# Patient Record
Sex: Male | Born: 1940
Health system: Southern US, Community
[De-identification: ages and names within clinical notes are randomized; demographics above are authoritative.]

## PROBLEM LIST (undated history)

## (undated) DIAGNOSIS — E785 Hyperlipidemia, unspecified: Secondary | ICD-10-CM

## (undated) DIAGNOSIS — W19XXXA Unspecified fall, initial encounter: Secondary | ICD-10-CM

## (undated) DIAGNOSIS — I639 Cerebral infarction, unspecified: Secondary | ICD-10-CM

## (undated) DIAGNOSIS — I872 Venous insufficiency (chronic) (peripheral): Secondary | ICD-10-CM

## (undated) DIAGNOSIS — I739 Peripheral vascular disease, unspecified: Secondary | ICD-10-CM

## (undated) DIAGNOSIS — I82409 Acute embolism and thrombosis of unspecified deep veins of unspecified lower extremity: Secondary | ICD-10-CM

## (undated) DIAGNOSIS — E669 Obesity, unspecified: Secondary | ICD-10-CM

## (undated) DIAGNOSIS — I219 Acute myocardial infarction, unspecified: Secondary | ICD-10-CM

## (undated) DIAGNOSIS — I509 Heart failure, unspecified: Secondary | ICD-10-CM

## (undated) DIAGNOSIS — I2510 Atherosclerotic heart disease of native coronary artery without angina pectoris: Secondary | ICD-10-CM

## (undated) DIAGNOSIS — I779 Disorder of arteries and arterioles, unspecified: Secondary | ICD-10-CM

## (undated) DIAGNOSIS — F0390 Unspecified dementia without behavioral disturbance: Secondary | ICD-10-CM

## (undated) DIAGNOSIS — I4891 Unspecified atrial fibrillation: Secondary | ICD-10-CM

## (undated) DIAGNOSIS — G4730 Sleep apnea, unspecified: Secondary | ICD-10-CM

## (undated) DIAGNOSIS — I10 Essential (primary) hypertension: Secondary | ICD-10-CM

## (undated) DIAGNOSIS — A4902 Methicillin resistant Staphylococcus aureus infection, unspecified site: Secondary | ICD-10-CM

## (undated) DIAGNOSIS — I214 Non-ST elevation (NSTEMI) myocardial infarction: Secondary | ICD-10-CM

## (undated) DIAGNOSIS — Y92009 Unspecified place in unspecified non-institutional (private) residence as the place of occurrence of the external cause: Secondary | ICD-10-CM

## (undated) HISTORY — DX: Heart failure, unspecified: I50.9

## (undated) HISTORY — DX: Acute myocardial infarction, unspecified: I21.9

## (undated) HISTORY — DX: Unspecified fall, initial encounter: W19.XXXA

## (undated) HISTORY — DX: Disorder of arteries and arterioles, unspecified: I77.9

## (undated) HISTORY — DX: Unspecified place in unspecified non-institutional (private) residence as the place of occurrence of the external cause: Y92.009

## (undated) HISTORY — DX: Atherosclerotic heart disease of native coronary artery without angina pectoris: I25.10

## (undated) HISTORY — DX: Unspecified atrial fibrillation: I48.91

## (undated) HISTORY — DX: Venous insufficiency (chronic) (peripheral): I87.2

## (undated) HISTORY — DX: Hyperlipidemia, unspecified: E78.5

## (undated) HISTORY — DX: Peripheral vascular disease, unspecified: I73.9

## (undated) HISTORY — DX: Essential (primary) hypertension: I10

## (undated) HISTORY — DX: Cerebral infarction, unspecified: I63.9

## (undated) HISTORY — DX: Acute embolism and thrombosis of unspecified deep veins of unspecified lower extremity: I82.409

## (undated) SURGERY — Surgical Case
Anesthesia: *Unknown

---

## 1978-05-14 HISTORY — PX: PR VEIN BYPASS GRAFT,AORTO-FEM-POP: 35551

## 1979-01-13 HISTORY — PX: INGUINAL HERNIA REPAIR: SUR1180

## 1995-05-15 HISTORY — PX: CORONARY ARTERY BYPASS GRAFT: SHX141

## 2000-12-17 ENCOUNTER — Ambulatory Visit (HOSPITAL_COMMUNITY): Admission: RE | Admit: 2000-12-17 | Discharge: 2000-12-18 | Payer: Self-pay | Admitting: Internal Medicine

## 2000-12-17 ENCOUNTER — Encounter: Payer: Self-pay | Admitting: Cardiology

## 2000-12-18 ENCOUNTER — Encounter: Payer: Self-pay | Admitting: Cardiology

## 2001-09-30 ENCOUNTER — Ambulatory Visit (HOSPITAL_COMMUNITY): Admission: RE | Admit: 2001-09-30 | Discharge: 2001-10-01 | Payer: Self-pay | Admitting: Cardiovascular Disease

## 2001-09-30 ENCOUNTER — Encounter: Payer: Self-pay | Admitting: Cardiovascular Disease

## 2001-10-01 ENCOUNTER — Encounter: Payer: Self-pay | Admitting: Cardiovascular Disease

## 2003-05-19 ENCOUNTER — Encounter: Admission: RE | Admit: 2003-05-19 | Discharge: 2003-05-19 | Payer: Self-pay | Admitting: Infectious Diseases

## 2003-08-18 ENCOUNTER — Encounter: Admission: RE | Admit: 2003-08-18 | Discharge: 2003-08-18 | Payer: Self-pay | Admitting: Infectious Diseases

## 2010-11-30 ENCOUNTER — Inpatient Hospital Stay (HOSPITAL_COMMUNITY)
Admission: EM | Admit: 2010-11-30 | Discharge: 2010-12-12 | DRG: 254 | Disposition: A | Discharge status: Home Health | Payer: Medicare Other | Attending: Vascular Surgery | Admitting: Vascular Surgery

## 2010-11-30 DIAGNOSIS — T827XXA Infection and inflammatory reaction due to other cardiac and vascular devices, implants and grafts, initial encounter: Principal | ICD-10-CM | POA: Diagnosis present

## 2010-11-30 DIAGNOSIS — K219 Gastro-esophageal reflux disease without esophagitis: Secondary | ICD-10-CM | POA: Diagnosis present

## 2010-11-30 DIAGNOSIS — Y849 Medical procedure, unspecified as the cause of abnormal reaction of the patient, or of later complication, without mention of misadventure at the time of the procedure: Secondary | ICD-10-CM | POA: Diagnosis present

## 2010-11-30 DIAGNOSIS — I2510 Atherosclerotic heart disease of native coronary artery without angina pectoris: Secondary | ICD-10-CM | POA: Diagnosis present

## 2010-11-30 DIAGNOSIS — I10 Essential (primary) hypertension: Secondary | ICD-10-CM | POA: Diagnosis present

## 2010-11-30 DIAGNOSIS — Z794 Long term (current) use of insulin: Secondary | ICD-10-CM

## 2010-11-30 DIAGNOSIS — Z7982 Long term (current) use of aspirin: Secondary | ICD-10-CM

## 2010-11-30 DIAGNOSIS — E1165 Type 2 diabetes mellitus with hyperglycemia: Secondary | ICD-10-CM | POA: Diagnosis present

## 2010-11-30 DIAGNOSIS — G4733 Obstructive sleep apnea (adult) (pediatric): Secondary | ICD-10-CM | POA: Diagnosis present

## 2010-11-30 DIAGNOSIS — IMO0002 Reserved for concepts with insufficient information to code with codable children: Secondary | ICD-10-CM | POA: Diagnosis present

## 2010-11-30 DIAGNOSIS — M629 Disorder of muscle, unspecified: Secondary | ICD-10-CM

## 2010-11-30 DIAGNOSIS — E118 Type 2 diabetes mellitus with unspecified complications: Secondary | ICD-10-CM | POA: Diagnosis present

## 2010-11-30 DIAGNOSIS — Z87891 Personal history of nicotine dependence: Secondary | ICD-10-CM

## 2010-12-01 ENCOUNTER — Inpatient Hospital Stay (HOSPITAL_COMMUNITY): Payer: Medicare Other

## 2010-12-01 DIAGNOSIS — I70219 Atherosclerosis of native arteries of extremities with intermittent claudication, unspecified extremity: Secondary | ICD-10-CM

## 2010-12-01 LAB — GLUCOSE, CAPILLARY
Glucose-Capillary: 216 mg/dL — ABNORMAL HIGH (ref 70–99)
Glucose-Capillary: 267 mg/dL — ABNORMAL HIGH (ref 70–99)
Glucose-Capillary: 298 mg/dL — ABNORMAL HIGH (ref 70–99)
Glucose-Capillary: 301 mg/dL — ABNORMAL HIGH (ref 70–99)

## 2010-12-01 LAB — HEMOGLOBIN A1C
Hgb A1c MFr Bld: 9.9 % — ABNORMAL HIGH (ref <5.7)
Mean Plasma Glucose: 237 mg/dL — ABNORMAL HIGH (ref <117)

## 2010-12-02 DIAGNOSIS — T827XXA Infection and inflammatory reaction due to other cardiac and vascular devices, implants and grafts, initial encounter: Secondary | ICD-10-CM

## 2010-12-02 DIAGNOSIS — I70219 Atherosclerosis of native arteries of extremities with intermittent claudication, unspecified extremity: Secondary | ICD-10-CM

## 2010-12-02 LAB — CBC
HCT: 45.1 % (ref 39.0–52.0)
Hemoglobin: 14.7 g/dL (ref 13.0–17.0)
MCH: 33 pg (ref 26.0–34.0)
MCHC: 32.6 g/dL (ref 30.0–36.0)
MCV: 101.3 fL — ABNORMAL HIGH (ref 78.0–100.0)
Platelets: 241 10*3/uL (ref 150–400)
RBC: 4.45 MIL/uL (ref 4.22–5.81)
RDW: 13.9 % (ref 11.5–15.5)
WBC: 12.6 10*3/uL — ABNORMAL HIGH (ref 4.0–10.5)

## 2010-12-02 LAB — GLUCOSE, CAPILLARY
Glucose-Capillary: 242 mg/dL — ABNORMAL HIGH (ref 70–99)
Glucose-Capillary: 257 mg/dL — ABNORMAL HIGH (ref 70–99)
Glucose-Capillary: 292 mg/dL — ABNORMAL HIGH (ref 70–99)
Glucose-Capillary: 230 mg/dL — ABNORMAL HIGH (ref 70–99)

## 2010-12-02 LAB — BASIC METABOLIC PANEL
BUN: 15 mg/dL (ref 6–23)
CO2: 34 mEq/L — ABNORMAL HIGH (ref 19–32)
Calcium: 9.2 mg/dL (ref 8.4–10.5)
Chloride: 96 mEq/L (ref 96–112)
Creatinine, Ser: 1.15 mg/dL (ref 0.50–1.35)
GFR calc Af Amer: >60 mL/min (ref >60)
GFR calc non Af Amer: >60 mL/min (ref >60)
Glucose, Bld: 254 mg/dL — ABNORMAL HIGH (ref 70–99)
Potassium: 3.7 mEq/L (ref 3.5–5.1)
Sodium: 138 mEq/L (ref 135–145)

## 2010-12-02 LAB — PROTIME-INR
INR: 1.27 (ref 0.00–1.49)
Prothrombin Time: 16.2 seconds — ABNORMAL HIGH (ref 11.6–15.2)

## 2010-12-03 DIAGNOSIS — T827XXA Infection and inflammatory reaction due to other cardiac and vascular devices, implants and grafts, initial encounter: Secondary | ICD-10-CM

## 2010-12-03 DIAGNOSIS — I70219 Atherosclerosis of native arteries of extremities with intermittent claudication, unspecified extremity: Secondary | ICD-10-CM

## 2010-12-03 LAB — BASIC METABOLIC PANEL
BUN: 22 mg/dL (ref 6–23)
CO2: 31 mEq/L (ref 19–32)
Calcium: 8.8 mg/dL (ref 8.4–10.5)
Chloride: 94 mEq/L — ABNORMAL LOW (ref 96–112)
Creatinine, Ser: 1.15 mg/dL (ref 0.50–1.35)
GFR calc Af Amer: >60 mL/min (ref >60)
GFR calc non Af Amer: >60 mL/min (ref >60)
Glucose, Bld: 233 mg/dL — ABNORMAL HIGH (ref 70–99)
Potassium: 3.2 mEq/L — ABNORMAL LOW (ref 3.5–5.1)
Sodium: 135 mEq/L (ref 135–145)

## 2010-12-03 LAB — CBC
HCT: 37.9 % — ABNORMAL LOW (ref 39.0–52.0)
Hemoglobin: 12.3 g/dL — ABNORMAL LOW (ref 13.0–17.0)
MCH: 32.2 pg (ref 26.0–34.0)
MCHC: 32.5 g/dL (ref 30.0–36.0)
MCV: 99.2 fL (ref 78.0–100.0)
Platelets: 203 K/uL (ref 150–400)
RBC: 3.82 MIL/uL — ABNORMAL LOW (ref 4.22–5.81)
RDW: 13.7 % (ref 11.5–15.5)
WBC: 10.8 K/uL — ABNORMAL HIGH (ref 4.0–10.5)

## 2010-12-03 LAB — GLUCOSE, CAPILLARY
Glucose-Capillary: 238 mg/dL — ABNORMAL HIGH (ref 70–99)
Glucose-Capillary: 303 mg/dL — ABNORMAL HIGH (ref 70–99)
Glucose-Capillary: 292 mg/dL — ABNORMAL HIGH (ref 70–99)

## 2010-12-03 LAB — MAGNESIUM: Magnesium: 2.3 mg/dL (ref 1.5–2.5)

## 2010-12-03 LAB — PROTIME-INR
INR: 1.11 (ref 0.00–1.49)
Prothrombin Time: 14.5 s (ref 11.6–15.2)

## 2010-12-04 DIAGNOSIS — I70219 Atherosclerosis of native arteries of extremities with intermittent claudication, unspecified extremity: Secondary | ICD-10-CM

## 2010-12-04 DIAGNOSIS — T82898A Other specified complication of vascular prosthetic devices, implants and grafts, initial encounter: Secondary | ICD-10-CM

## 2010-12-04 LAB — CBC
HCT: 40 % (ref 39.0–52.0)
Hemoglobin: 12.8 g/dL — ABNORMAL LOW (ref 13.0–17.0)
MCH: 32.4 pg (ref 26.0–34.0)
MCHC: 32 g/dL (ref 30.0–36.0)
MCV: 101.3 fL — ABNORMAL HIGH (ref 78.0–100.0)
Platelets: 224 10*3/uL (ref 150–400)
RBC: 3.95 MIL/uL — ABNORMAL LOW (ref 4.22–5.81)
RDW: 13.8 % (ref 11.5–15.5)
WBC: 8.3 10*3/uL (ref 4.0–10.5)

## 2010-12-04 LAB — GLUCOSE, CAPILLARY
Glucose-Capillary: 249 mg/dL — ABNORMAL HIGH (ref 70–99)
Glucose-Capillary: 223 mg/dL — ABNORMAL HIGH (ref 70–99)
Glucose-Capillary: 192 mg/dL — ABNORMAL HIGH (ref 70–99)
Glucose-Capillary: 279 mg/dL — ABNORMAL HIGH (ref 70–99)
Glucose-Capillary: 263 mg/dL — ABNORMAL HIGH (ref 70–99)
Glucose-Capillary: 231 mg/dL — ABNORMAL HIGH (ref 70–99)

## 2010-12-04 LAB — BASIC METABOLIC PANEL
BUN: 17 mg/dL (ref 6–23)
CO2: 35 mEq/L — ABNORMAL HIGH (ref 19–32)
Calcium: 9.2 mg/dL (ref 8.4–10.5)
Chloride: 96 mEq/L (ref 96–112)
Creatinine, Ser: 1.19 mg/dL (ref 0.50–1.35)
GFR calc Af Amer: >60 mL/min (ref >60)
GFR calc non Af Amer: >60 mL/min (ref >60)
Glucose, Bld: 234 mg/dL — ABNORMAL HIGH (ref 70–99)
Potassium: 4.3 mEq/L (ref 3.5–5.1)
Sodium: 137 mEq/L (ref 135–145)

## 2010-12-04 LAB — PROTIME-INR
INR: 1.11 (ref 0.00–1.49)
Prothrombin Time: 14.5 seconds (ref 11.6–15.2)

## 2010-12-04 LAB — MAGNESIUM: Magnesium: 2.4 mg/dL (ref 1.5–2.5)

## 2010-12-05 ENCOUNTER — Inpatient Hospital Stay (HOSPITAL_COMMUNITY): Payer: Medicare Other

## 2010-12-05 DIAGNOSIS — T827XXA Infection and inflammatory reaction due to other cardiac and vascular devices, implants and grafts, initial encounter: Secondary | ICD-10-CM

## 2010-12-05 DIAGNOSIS — I743 Embolism and thrombosis of arteries of the lower extremities: Secondary | ICD-10-CM

## 2010-12-05 LAB — BASIC METABOLIC PANEL
BUN: 17 mg/dL (ref 6–23)
CO2: 34 mEq/L — ABNORMAL HIGH (ref 19–32)
Calcium: 9.3 mg/dL (ref 8.4–10.5)
Chloride: 99 mEq/L (ref 96–112)
Creatinine, Ser: 1.23 mg/dL (ref 0.50–1.35)
GFR calc Af Amer: >60 mL/min (ref >60)
GFR calc non Af Amer: 58 mL/min — ABNORMAL LOW (ref >60)
Glucose, Bld: 231 mg/dL — ABNORMAL HIGH (ref 70–99)
Potassium: 4.1 mEq/L (ref 3.5–5.1)
Sodium: 140 mEq/L (ref 135–145)

## 2010-12-05 LAB — GLUCOSE, CAPILLARY
Glucose-Capillary: 207 mg/dL — ABNORMAL HIGH (ref 70–99)
Glucose-Capillary: 175 mg/dL — ABNORMAL HIGH (ref 70–99)
Glucose-Capillary: 245 mg/dL — ABNORMAL HIGH (ref 70–99)

## 2010-12-05 LAB — SURGICAL PCR SCREEN
MRSA, PCR: POSITIVE — AB
Staphylococcus aureus: POSITIVE — AB

## 2010-12-05 LAB — GRAM STAIN

## 2010-12-06 LAB — PROTIME-INR
INR: 1.16 (ref 0.00–1.49)
Prothrombin Time: 15 seconds (ref 11.6–15.2)

## 2010-12-06 LAB — BASIC METABOLIC PANEL
BUN: 18 mg/dL (ref 6–23)
CO2: 31 mEq/L (ref 19–32)
Calcium: 8.4 mg/dL (ref 8.4–10.5)
Chloride: 101 mEq/L (ref 96–112)
Creatinine, Ser: 1.33 mg/dL (ref 0.50–1.35)
GFR calc Af Amer: >60 mL/min (ref >60)
GFR calc non Af Amer: 53 mL/min — ABNORMAL LOW (ref >60)
Glucose, Bld: 200 mg/dL — ABNORMAL HIGH (ref 70–99)
Potassium: 4.4 mEq/L (ref 3.5–5.1)
Sodium: 140 mEq/L (ref 135–145)

## 2010-12-06 LAB — CBC
HCT: 32.9 % — ABNORMAL LOW (ref 39.0–52.0)
Hemoglobin: 10.6 g/dL — ABNORMAL LOW (ref 13.0–17.0)
MCH: 32.4 pg (ref 26.0–34.0)
MCHC: 32.2 g/dL (ref 30.0–36.0)
MCV: 100.6 fL — ABNORMAL HIGH (ref 78.0–100.0)
Platelets: 194 10*3/uL (ref 150–400)
RBC: 3.27 MIL/uL — ABNORMAL LOW (ref 4.22–5.81)
RDW: 14 % (ref 11.5–15.5)
WBC: 10.6 10*3/uL — ABNORMAL HIGH (ref 4.0–10.5)

## 2010-12-06 LAB — GLUCOSE, CAPILLARY
Glucose-Capillary: 226 mg/dL — ABNORMAL HIGH (ref 70–99)
Glucose-Capillary: 221 mg/dL — ABNORMAL HIGH (ref 70–99)
Glucose-Capillary: 200 mg/dL — ABNORMAL HIGH (ref 70–99)
Glucose-Capillary: 163 mg/dL — ABNORMAL HIGH (ref 70–99)
Glucose-Capillary: 147 mg/dL — ABNORMAL HIGH (ref 70–99)

## 2010-12-06 NOTE — Op Note (Signed)
Isaiah [name], Isaiah [name] NO.:  [id number]  MEDICAL RECORD NO.:  [id number]  LOCATION:  2037                         FACILITY:  [facility]  PHYSICIAN:  Di Kindle. [name], M.D.DATE OF BIRTH:  Aug 13, 1940  DATE OF PROCEDURE:  12/04/2010 DATE OF DISCHARGE:                              OPERATIVE REPORT   PREOPERATIVE DIAGNOSIS:  Infected left-to-right femorofemoral bypass graft.  POSTOPERATIVE DIAGNOSIS:  Infected left-to-right femorofemoral bypass graft.  PROCEDURES: 1. Ultrasound-guided access to the left brachial artery. 2. Arch aortogram. 3. Aortogram with bilateral iliac arteriogram and bilateral lower     extreme runoff.  SURGEON:  Di Kindle. [name], MD  ANESTHESIA:  Local with sedation.  INDICATIONS:  This is a 70 year old gentleman who had undergone a fem- fem bypass graft by [name] in the 1980s.  He presented with suprapubic pain and CAT scan showed evidence of infection around the left side of his graft.  He has scheduled for removal of this graft.  TECHNIQUE:  The patient was taken to the [facility] and sedated with a milligram of Versed and 50 mcg of fentanyl.  He later received an additional 50 mcg of fentanyl.  The left arm was prepped and draped in the usual sterile fashion.  Under ultrasound guidance and after the skin was anesthetized, the left brachial artery was cannulated and guidewire was introduced into the aortic arch under fluoroscopic control.  A 5- French sheath was introduced over the wire.  200 mcg of nitroglycerin and 2000 units of IV heparin were introduced through the sheath.  The pigtail catheter was positioned in the ascending aortic arch and arch aortogram was obtained at a 40-degree LAO projection.  Next, I exchanged the pigtail catheter for a JR-4 catheter which was used to direct the angled Glidewire down into the descending thoracic aorta.  The JR-4 was then exchanged for the pigtail catheter which was positioned at  the L1 vertebral body.  Flush aortogram was obtained and then an oblique iliac projection was obtained.  Next, bilateral lower extremity runoff films were obtained.  FINDINGS:  The arch is patent with no significant branch vessel disease. The right subclavian is widely patent for potential right axillofemoral bypass grafting if necessary.  The renal arteries are patent bilaterally with no significant renal artery stenosis identified and the infrarenal aorta is widely patent.  On the right side, there is moderate stenosis of the right common iliac artery, but this is patent.  The hypogastric artery on the right is patent.  The external iliac artery on the right is occluded.  The fem- fem bypass graft is patent and on the right side there is filling of the deep femoral and superficial femoral artery.  The popliteal, anterior tibial, posterior tibial, and peroneal arteries are patent on the right. There is no significant infrainguinal arterial occlusive disease on the right.  On the left side, the common iliac, external iliac, and hypogastric arteries are patent.  The left common femoral, superficial femoral, deep femoral, and popliteal arteries are patent.  The proximal tibial vessels are patent bilaterally on the left.  CONCLUSIONS: 1. Patent fem-fem bypass graft. 2. Right external iliac artery occlusion. 3. No significant  infrainguinal arterial occlusive disease.     Di Kindle. [name], M.D.     CSD/MEDQ  D:  12/04/2010  T:  12/04/2010  Job:  [id number]  Electronically Signed by Waverly Ferrari M.D. on 12/06/2010 11:12:07 AM

## 2010-12-06 NOTE — H&P (Signed)
NAMEEFRAIN, CLAUSON NO.:  [id number]  MEDICAL RECORD NO.:  [id number]  LOCATION:  2037                         FACILITY:  [facility]  PHYSICIAN:  Di Kindle. [name], M.D.DATE OF BIRTH:  1940/09/28  DATE OF ADMISSION:  11/30/2010 DATE OF DISCHARGE:                             HISTORY & PHYSICAL   REASON FOR ADMISSION:  Possible infected left-to-right fem-fem bypass graft.  HISTORY:  This is a 70 year old gentleman who underwent a left-to-right fem-fem bypass graft in the 1980s by [name].  The graft has been followed in Dr. [name] office and his last followup study was approximately a year ago, at which time the graft was patent.  This patient presented to [facility] with a 2- to 3-day history of suprapubic pain on the left side.  The pain came on gradually and has been fairly constant.  He also states that he had a fever at home.  He has not had any chills.  He has not had any drainage from the wounds. He denies any history of claudication, rest pain, or nonhealing ulcers.  His past medical history is significant for type 2 diabetes, hypertension, hypercholesterolemia, and coronary artery disease.  He underwent coronary revascularization in 1997 with vein taken from the right leg.  He denies any history of congestive heart failure or history of emphysema.  FAMILY HISTORY:  He is unaware of any history of premature cardiovascular disease.  SOCIAL HISTORY:  He is divorced.  He has 5 children.  He quit tobacco in the 1980s.  REVIEW OF SYSTEMS:  GENERAL:  He has had no recent weight loss, weight gain or problems with his appetite.  He did have a fever which he states as it was as high as 101.  CARDIOVASCULAR:  He has had no chest pain, chest pressure or palpitations.  He has a history of atrial fibrillation and he is on Coumadin for this.  He denies any history of stroke or TIAs.  He has had no history of DVT or phlebitis. PULMONARY:  He  has a history of sleep apnea and uses CPAP at night.  He has had no recent productive cough, bronchitis, asthma or wheezing.  GI: He has had no change in his bowel habits and has no history of peptic ulcer disease.  GU:  He has had no dysuria or frequency.  NEUROLOGIC: He has had no dizziness, blackouts, headaches or seizures. MUSCULOSKELETAL: He does have a history of arthritis.  Psychiatric, ENT, hematologic, integumentary review of systems is unremarkable.  ALLERGIES:  No known drug allergies.  MEDICATIONS: 1. Metoprolol tartrate 100 mg p.o. b.i.d. 2. Humulin R sliding scale. 3. Lasix 40 mg p.o. daily. 4. Enalapril maleate 20 mg p.o. daily. 5. Potassium chloride 20 mEq p.o. daily. 6. Pantoprazole 40 mg p.o. b.i.d. 7. Metformin 500 mg p.o. daily. 8. Glipizide 10 mg p.o. b.i.d. 9. Isosorbide dinitrate 60 mg p.o. daily. 10.Allopurinol 100 mg p.o. daily. 11.Aspirin 81 mg p.o. daily.  PHYSICAL EXAMINATION:  GENERAL:  This is a pleasant 70 year old gentleman who appears his stated age. VITAL SIGNS:  His temperature in our emergency department is 98.7, heart rate is 79, blood pressure 176/59. HEENT:  Unremarkable. LUNGS:  Clear bilaterally to auscultation without rales, rhonchi or wheezing. CARDIOVASCULAR:  I did not detect any carotid bruits.  He has a regular rate and rhythm. ABDOMEN:  Obese and difficult to assess.  He has normal pitched bowel sounds.  He has some induration in the suprapubic area on the left. MUSCULOSKELETAL:  No major deformities or cyanosis. VASCULAR:  He has palpable femoral, popliteal, dorsalis pedis pulses bilaterally in the left posterior tibial pulse, I cannot palpate a right posterior tibial pulse. NEUROLOGIC:  He has no focal weakness or paresthesias. SKIN:  There are no ulcers or rashes.  I have reviewed his CT scan which shows inflammation around his fem-fem bypass graft which is tunneled fairly low in the suprapubic area.  I have also reviewed  his labs from [facility].  I do not believe that a protime was drawn.  His white blood cell count was 10.5, H and H 14 and 41, platelets 188,000, creatinine was 0.82.  IMPRESSION:  This is a 70 year old gentleman status post a fem-fem bypass graft in the 1980s who presents with suprapubic pain overlying the graft on the left side and a CT scan with inflammation around the graft in a focal area.  I have explained that certainly he could have a graft infection.  He will be admitted for intravenous antibiotics and we will start him on IV Zosyn.  Blood cultures will be obtained.  If his symptoms do not improve rapidly, we will have to consider removal of his graft.  I have explained that I am concerned that if we remove the graft, he could be left with an ischemic right lower extremity.  I will be reluctant to place an ex-stem graft immediately given the infection and this could potentially be done in a delayed fashion.  Consideration could also be given to obtain an arteriogram if felt that he will likely require a surgery to further plan his procedure.  Of note, he is on Coumadin.  We will have to check a protime and his Coumadin will have to be held prior to any surgery if this is necessary.  Currently his exam is fairly unimpressive, however, clearly on CAT scan, there is some inflammation around 1 focal area of the graft.     Di Kindle. [name], M.D.     CSD/MEDQ  D:  11/30/2010  T:  12/01/2010  Job:  [id number]  Electronically Signed by Waverly Ferrari M.D. on 12/06/2010 11:12:01 AM

## 2010-12-06 NOTE — Op Note (Signed)
Isaiah [name], BHAT NO.:  [id number]  MEDICAL RECORD NO.:  [id number]  LOCATION:  3302                         FACILITY:  [facility]  PHYSICIAN:  Di Kindle. [name], M.D.DATE OF BIRTH:  08/24/1940  DATE OF PROCEDURE:  12/05/2010 DATE OF DISCHARGE:                              OPERATIVE REPORT   PREOPERATIVE DIAGNOSIS:  Infected fem-fem bypass graft.  POSTOPERATIVE DIAGNOSIS:  Infected fem-fem bypass graft.  PROCEDURES: 1. Removal of infected fem-fem bypass graft. 2. Vein patch angioplasty of bilateral common femoral arteries.  SURGEON:  Di Kindle. [name], MD  ASSISTANT:  Della Goo, PA-C  ANESTHESIA:  General.  INDICATIONS:  This is a 70 year old gentleman who had undergone a fem- fem bypass graft in the 80s for claudication of the right lower extremity and right external iliac artery occlusion.  He presented with suprapubic pain on the left and CAT scan showed evidence of the localized graft infection on the left.  He continued to have pain despite intravenous antibiotics.  He underwent preoperative workup and then was brought for elective removal of his graft with vein patch angioplasty of the femoral arteries.  TECHNIQUE:  The patient was taken to the operating room and received a general anesthetic.  The groins and left thigh were prepped and draped in the usual sterile fashion.  An oblique incision was made in the right groin and through this incision, the fem-fem bypass graft was dissected free.  This was incorporated in the right groin and through scar tissue I dissected out the common femoral, superficial femoral, and deep femoral arteries.  The graft was divided after it was ligated and dissected out from the right groin towards the left side.  Next, a longitudinal incision was made in the left groin that was obliquely oriented to allow taking some of the proximal saphenous vein to be used as a vein patch.  Through this incision,  the proximal saphenous vein was harvested over a distance of about 8 cm.  Again through scar tissue, the common femoral, superficial femoral, and deep femoral arteries were dissected free and then the graft was divided after it was clamped.  The patient was then heparinized.  On the right side after the vessels were clamped, the entire remnant of the fem-fem graft was removed from the common femoral artery.  The vein was opened longitudinally and then used as a vein patch.  This was sewn with continuous 5-0 Prolene suture. Prior to completing the closure, there was good backbleeding from the deep femoral artery and superficial femoral artery through collaterals. There was minimal flow through the external iliac artery which was chronically occluded.  The vein patch was completed on the right.  On the left side, the vessels were controlled and again the entire remnant of the fem-fem bypass graft was removed from the common femoral artery. An additional vein patch was tailored and sewn using continuous 5-0 Prolene suture.  Prior to completing this anastomosis, the arteries were back-bled and flushed and the anastomosis completed.  There was excellent flow on the left side.  Next, the entire graft was removed. The localized area of infection which was on the left side was identified and  once we entered this area, there was purulent fluid here which was cultured.  I also cultured the segment of the graft that was involved with this area.  The wound was irrigated with copious amounts of saline.  Both #19 Blake drains were closed in both groins.  After hemostasis was obtained and the heparin partially reversed with protamine, the wounds were closed with deep layer of 2-0 Vicryl.  The subcutaneous layer was closed with 3-0 Vicryl and the skin closed with 4- 0 subcuticular stitches.  Sterile dressing was applied.  The patient tolerated the procedure well and was transferred to the recovery room  in stable condition.  All needle and sponge counts were correct.     Di Kindle. [name], M.D.     CSD/MEDQ  D:  12/05/2010  T:  12/06/2010  Job:  [id number]  Electronically Signed by Waverly Ferrari M.D. on 12/06/2010 11:12:13 AM

## 2010-12-07 LAB — CBC
HCT: 30.1 % — ABNORMAL LOW (ref 39.0–52.0)
Hemoglobin: 9.6 g/dL — ABNORMAL LOW (ref 13.0–17.0)
MCH: 31.9 pg (ref 26.0–34.0)
MCHC: 31.9 g/dL (ref 30.0–36.0)
MCV: 100 fL (ref 78.0–100.0)
Platelets: 201 10*3/uL (ref 150–400)
RBC: 3.01 MIL/uL — ABNORMAL LOW (ref 4.22–5.81)
RDW: 14.1 % (ref 11.5–15.5)
WBC: 11.8 10*3/uL — ABNORMAL HIGH (ref 4.0–10.5)

## 2010-12-07 LAB — BASIC METABOLIC PANEL
BUN: 16 mg/dL (ref 6–23)
CO2: 30 mEq/L (ref 19–32)
Calcium: 8.9 mg/dL (ref 8.4–10.5)
Chloride: 99 mEq/L (ref 96–112)
Creatinine, Ser: 1.11 mg/dL (ref 0.50–1.35)
GFR calc Af Amer: >60 mL/min (ref >60)
GFR calc non Af Amer: >60 mL/min (ref >60)
Glucose, Bld: 170 mg/dL — ABNORMAL HIGH (ref 70–99)
Potassium: 4.1 mEq/L (ref 3.5–5.1)
Sodium: 137 mEq/L (ref 135–145)

## 2010-12-07 LAB — CULTURE, BLOOD (ROUTINE X 2)
Culture  Setup Time: 201207200454
Culture  Setup Time: 201207200454
Culture: NO GROWTH
Culture: NO GROWTH

## 2010-12-07 LAB — GLUCOSE, CAPILLARY
Glucose-Capillary: 168 mg/dL — ABNORMAL HIGH (ref 70–99)
Glucose-Capillary: 79 mg/dL (ref 70–99)
Glucose-Capillary: 195 mg/dL — ABNORMAL HIGH (ref 70–99)
Glucose-Capillary: 188 mg/dL — ABNORMAL HIGH (ref 70–99)
Glucose-Capillary: 198 mg/dL — ABNORMAL HIGH (ref 70–99)

## 2010-12-07 LAB — PROTIME-INR
INR: 1.21 (ref 0.00–1.49)
Prothrombin Time: 15.6 seconds — ABNORMAL HIGH (ref 11.6–15.2)

## 2010-12-08 LAB — GLUCOSE, CAPILLARY
Glucose-Capillary: 178 mg/dL — ABNORMAL HIGH (ref 70–99)
Glucose-Capillary: 189 mg/dL — ABNORMAL HIGH (ref 70–99)
Glucose-Capillary: 107 mg/dL — ABNORMAL HIGH (ref 70–99)

## 2010-12-08 LAB — CULTURE, ROUTINE-ABSCESS

## 2010-12-08 LAB — TISSUE CULTURE

## 2010-12-08 LAB — PROTIME-INR
INR: 2.18 — ABNORMAL HIGH (ref 0.00–1.49)
Prothrombin Time: 24.6 seconds — ABNORMAL HIGH (ref 11.6–15.2)

## 2010-12-09 LAB — GLUCOSE, CAPILLARY
Glucose-Capillary: 141 mg/dL — ABNORMAL HIGH (ref 70–99)
Glucose-Capillary: 115 mg/dL — ABNORMAL HIGH (ref 70–99)
Glucose-Capillary: 191 mg/dL — ABNORMAL HIGH (ref 70–99)
Glucose-Capillary: 178 mg/dL — ABNORMAL HIGH (ref 70–99)
Glucose-Capillary: 108 mg/dL — ABNORMAL HIGH (ref 70–99)

## 2010-12-09 LAB — BASIC METABOLIC PANEL
BUN: 16 mg/dL (ref 6–23)
CO2: 37 mEq/L — ABNORMAL HIGH (ref 19–32)
Calcium: 8.8 mg/dL (ref 8.4–10.5)
Chloride: 95 mEq/L — ABNORMAL LOW (ref 96–112)
Creatinine, Ser: 0.96 mg/dL (ref 0.50–1.35)
GFR calc Af Amer: >60 mL/min (ref >60)
GFR calc non Af Amer: >60 mL/min (ref >60)
Glucose, Bld: 112 mg/dL — ABNORMAL HIGH (ref 70–99)
Potassium: 3.3 mEq/L — ABNORMAL LOW (ref 3.5–5.1)
Sodium: 139 mEq/L (ref 135–145)

## 2010-12-09 LAB — VANCOMYCIN, TROUGH: Vancomycin Tr: 8.3 ug/mL — ABNORMAL LOW (ref 10.0–20.0)

## 2010-12-09 LAB — PROTIME-INR
INR: 1.18 (ref 0.00–1.49)
Prothrombin Time: 15.2 seconds (ref 11.6–15.2)

## 2010-12-10 LAB — ANAEROBIC CULTURE

## 2010-12-10 LAB — PROTIME-INR
INR: 1.13 (ref 0.00–1.49)
Prothrombin Time: 14.7 seconds (ref 11.6–15.2)

## 2010-12-10 LAB — GLUCOSE, CAPILLARY
Glucose-Capillary: 102 mg/dL — ABNORMAL HIGH (ref 70–99)
Glucose-Capillary: 200 mg/dL — ABNORMAL HIGH (ref 70–99)
Glucose-Capillary: 154 mg/dL — ABNORMAL HIGH (ref 70–99)
Glucose-Capillary: 76 mg/dL (ref 70–99)

## 2010-12-11 DIAGNOSIS — T827XXA Infection and inflammatory reaction due to other cardiac and vascular devices, implants and grafts, initial encounter: Secondary | ICD-10-CM

## 2010-12-11 LAB — GLUCOSE, CAPILLARY
Glucose-Capillary: 91 mg/dL (ref 70–99)
Glucose-Capillary: 175 mg/dL — ABNORMAL HIGH (ref 70–99)
Glucose-Capillary: 134 mg/dL — ABNORMAL HIGH (ref 70–99)
Glucose-Capillary: 70 mg/dL (ref 70–99)

## 2010-12-11 LAB — PROTIME-INR
INR: 1.18 (ref 0.00–1.49)
Prothrombin Time: 15.2 seconds (ref 11.6–15.2)

## 2010-12-12 LAB — PROTIME-INR
INR: 1.23 (ref 0.00–1.49)
Prothrombin Time: 15.8 seconds — ABNORMAL HIGH (ref 11.6–15.2)

## 2010-12-12 LAB — GLUCOSE, CAPILLARY: Glucose-Capillary: 79 mg/dL (ref 70–99)

## 2010-12-18 NOTE — Discharge Summary (Addendum)
NAMESAYF, KERNER NO.:  [id number]  MEDICAL RECORD NO.:  [id number]  LOCATION:  2006                         FACILITY:  [facility]  PHYSICIAN:  Di Kindle. [name], M.D.DATE OF BIRTH:  09-15-1940  DATE OF ADMISSION:  11/30/2010 DATE OF DISCHARGE:  12/12/2010                              DISCHARGE SUMMARY   CHIEF COMPLAINT:  Possible infected left-to-right fem-fem bypass graft.  HISTORY OF PRESENT ILLNESS:  Isaiah [name] is a 70 year old gentleman who underwent a left-to-right fem-fem bypass graft in 79s by [name]. He describes a followup in Dr. [name] office and was last followup study was approximately a year ago, the graft was patent.  The patient presented to [facility] with a 2-3 day history of suprapubic pain on the left side, which came on gradually and has been fairly constant. He also states he had a fever at home.  But denies chills.  He has not had any drainage from the wounds. He denies any stroke, claudication, rest pain or nonhealing ulcers.  PAST MEDICAL HISTORY: 1. Type 2 diabetes. 2. Hypertension. 3. Hypercholesterolemia. 4. Coronary artery disease. 5. Coronary artery bypass.  Denies any history of congestive failure or emphysema.  The patient was admitted to the hospital, an CT scan of the abdomen was reviewed which showed inflammation around his fem-fem bypass graft which was tunneled fairly well in the suprapubic area, and he was admitted to [facility] for IV antibiotics and probable removal of fem-fem bypass.  HOSPITAL COURSE:  The patient had an angiogram on December 04, 2010, which showed patent fem-fem bypass, right external iliac artery occlusive disease with collaterals to the femoral system and no significant infrarenal arterial occlusive disease.  He was then taken to the operating room on December 05, 2010, for removal of infected fem-fem bypass with vein patch angioplasty of bilateral common femoral  arteries. Postoperatively, the patient did well.  He had Doppler signal in both feet.  He was ambulating, voiding and taking p.o.  His pain was well- controlled.  All of his incisions were healing well and he was discharged to home to be followed up in 2 weeks in the office.  DISCHARGE MEDICATIONS:  Include 1. Metoprolol 125 mg twice daily. 2. Lovenox 115 mg b.i.d. 3. Oxycodone 5 mg 1-2 tablets every 4 hours as needed for pain #30     were given. 4. Doxycycline 100 mg b.i.d. 5. Isosorbide XR 60 mg daily. 6. Allopurinol 100 mg daily. 7. Multivitamins daily. 8. Aspirin 81 mg daily. 9. Metoprolol was as above. 10.Nutritional tabs 2 tablets daily. 11.Calcium chloride 20 mg daily 12.Glipizide 10 mg b.i.d. 13.Metformin 500 mg half a tab b.i.d. 14.Enalapril 20 mg one and a half tablet daily. 15.Protonix 40 mg b.i.d. 16.Lasix 40 mg daily. 17.Coumadin 5 mg on Monday, Wednesday, Friday, Sunday, 7.5 mg Tuesday,     Thursday, Saturday. 18.Humulin R 500 units per mL 5-18 per sliding scale as listed.  FINAL DIAGNOSIS:  Status post removal of infected fem-fem bypass in a 71- year-old patient.  All of his other medical issues were stable while in- house.  His metoprolol was increased from 100 mg to 125 mg.  He will  be maintained on doxycycline for infection.  DISPOSITION:  The patient was discharged to home, he will follow up with [name] in 2 weeks.     Isaiah Goo, PA-C   ______________________________ Di Kindle. [name], M.D.    RR/MEDQ  D:  12/15/2010  T:  12/15/2010  Job:  [id number]  Electronically Signed by Isaiah Goo PA on 12/18/2010 02:54:51 PM Electronically Signed by Isaiah [name] M.D. on 12/20/2010 09:47:59 AM

## 2011-01-03 ENCOUNTER — Ambulatory Visit (INDEPENDENT_AMBULATORY_CARE_PROVIDER_SITE_OTHER): Payer: Medicare Other | Admitting: Vascular Surgery

## 2011-01-03 ENCOUNTER — Encounter: Payer: Self-pay | Admitting: Vascular Surgery

## 2011-01-03 VITALS — BP 158/70 | HR 53 | Temp 98.1°F | Ht 69.0 in | Wt 248.0 lb

## 2011-01-03 DIAGNOSIS — I739 Peripheral vascular disease, unspecified: Secondary | ICD-10-CM

## 2011-01-03 DIAGNOSIS — T827XXA Infection and inflammatory reaction due to other cardiac and vascular devices, implants and grafts, initial encounter: Secondary | ICD-10-CM

## 2011-01-03 NOTE — Progress Notes (Signed)
Subjective:     Patient ID: Isaiah [name], male   DOB: 1941/04/26, 70 y.o.   MRN: [id number]  HPI  This is a pleasant 70 year old gentleman who had undergone a left-to-right fem-fem bypass graft in the 1980s by [name]. He presented with an infected fem-fem bypass graft. He underwent removal of his infected fem-fem bypass graft with vein patch angioplasty of bilateral common femoral arteries on 12/05/2010. He did well postoperatively and returns for her first outpatient visit. He has a known right common iliac artery occlusion but since removal of his graft has remained asymptomatic. He has had no claudication rest pain or nonhealing ulcers. States that the wounds have not been a problem.  History  Substance Use Topics  . Smoking status: Former Smoker    Quit date: 05/14/1978  . Smokeless tobacco: Not on file  . Alcohol Use: No   Review of Systems  Constitutional: Negative for fever and chills.  Respiratory: Negative for chest tightness and shortness of breath.        Objective:   Physical Exam  Cardiovascular: Normal rate, regular rhythm and normal pulses.  Exam reveals no friction rub.   No murmur heard. Pulmonary/Chest: He has no wheezes. He has no rales.  Both groin incisions are healing nicely without erythema or drainage. He has a palpable dorsalis pedis pulse in the left foot with no palpable pulses in the right foot. However, the right foot is warm and well perfused. His scrotal swelling has improved significantly.      Assessment:     Overall I am pleased with his progress he has no significant symptoms from his known right common iliac artery occlusion. I have stressed to him the importance of remaining as active as possible and especially trying to do as much walking as possible.    Plan:     I will see him back in one year. He knows to call sooner if he has problems. I have ordered followup ABIs for that time. Fortunately he is not a smoker.

## 2012-01-08 ENCOUNTER — Encounter: Payer: Self-pay | Admitting: Neurosurgery

## 2012-01-09 ENCOUNTER — Ambulatory Visit (INDEPENDENT_AMBULATORY_CARE_PROVIDER_SITE_OTHER): Payer: Medicare Other | Admitting: Neurosurgery

## 2012-01-09 ENCOUNTER — Encounter: Payer: Self-pay | Admitting: Neurosurgery

## 2012-01-09 ENCOUNTER — Encounter (INDEPENDENT_AMBULATORY_CARE_PROVIDER_SITE_OTHER): Payer: Medicare Other | Admitting: *Deleted

## 2012-01-09 VITALS — BP 111/58 | HR 62 | Resp 18 | Ht 67.8 in | Wt 260.0 lb

## 2012-01-09 DIAGNOSIS — T827XXA Infection and inflammatory reaction due to other cardiac and vascular devices, implants and grafts, initial encounter: Secondary | ICD-10-CM

## 2012-01-09 DIAGNOSIS — I739 Peripheral vascular disease, unspecified: Secondary | ICD-10-CM

## 2012-01-09 DIAGNOSIS — I7092 Chronic total occlusion of artery of the extremities: Secondary | ICD-10-CM | POA: Insufficient documentation

## 2012-01-09 DIAGNOSIS — Z48812 Encounter for surgical aftercare following surgery on the circulatory system: Secondary | ICD-10-CM | POA: Insufficient documentation

## 2012-01-09 NOTE — Progress Notes (Signed)
[facility] [place] PAD/PVD Office Note  CC: PVD surveillance Referring Physician: [name]  History of Present Illness: 71 year old male patient of Dr. Adele Dan followed status post a removal of a left right fem-fem bypass graft that was done in the 1980s. The patient reports no claudication, rest pain or open ulcerations. The patient states he does have knee problems and is being seen for that elsewhere.  Past Medical History  Diagnosis Date  . Diabetes mellitus   . Hypertension   . Hyperlipidemia   . CAD (coronary artery disease)     ROS: [x]  Positive   [ ]  Denies    General: [ ]  Weight loss, [ ]  Fever, [ ]  chills Neurologic: [ ]  Dizziness, [ ]  Blackouts, [ ]  Seizure [ ]  Stroke, [ ]  "Mini stroke", [ ]  Slurred speech, [ ]  Temporary blindness; [ ]  weakness in arms or legs, [ ]  Hoarseness Cardiac: [ ]  Chest pain/pressure, [ ]  Shortness of breath at rest [ ]  Shortness of breath with exertion, [ ]  Atrial fibrillation or irregular heartbeat Vascular: [ ]  Pain in legs with walking, [ ]  Pain in legs at rest, [ ]  Pain in legs at night,  [ ]  Non-healing ulcer, [ ]  Blood clot in vein/DVT,   Pulmonary: [ ]  Home oxygen, [ ]  Productive cough, [ ]  Coughing up blood, [ ]  Asthma,  [ ]  Wheezing Musculoskeletal:  [ ]  Arthritis, [ ]  Low back pain, [ ]  Joint pain Hematologic: [ ]  Easy Bruising, [ ]  Anemia; [ ]  Hepatitis Gastrointestinal: [ ]  Blood in stool, [ ]  Gastroesophageal Reflux/heartburn, [ ]  Trouble swallowing Urinary: [ ]  chronic Kidney disease, [ ]  on HD - [ ]  MWF or [ ]  TTHS, [ ]  Burning with urination, [ ]  Difficulty urinating Skin: [ ]  Rashes, [ ]  Wounds Psychological: [ ]  Anxiety, [ ]  Depression   Social History History  Substance Use Topics  . Smoking status: Former Smoker    Quit date: 05/14/1978  . Smokeless tobacco: Not on file  . Alcohol Use: No    Family History No family history on file.  No Known Allergies  Current Outpatient Prescriptions    Medication Sig Dispense Refill  . allopurinol (ZYLOPRIM) 100 MG tablet Take 100 mg by mouth daily.        Marland Kitchen aspirin 81 MG tablet Take 81 mg by mouth daily.        Marland Kitchen doxycycline (VIBRAMYCIN) 100 MG capsule Take 100 mg by mouth 2 (two) times daily.        . enalapril (VASOTEC) 20 MG tablet Take 20 mg by mouth daily. 1 1/2 tablets daily        . furosemide (LASIX) 40 MG tablet Take 40 mg by mouth 2 (two) times daily.        Marland Kitchen glipiZIDE (GLUCOTROL XL) 10 MG 24 hr tablet Take 10 mg by mouth 2 (two) times daily.        Marland Kitchen HYDROcodone-acetaminophen (VICODIN) 5-500 MG per tablet Take 1 tablet by mouth every 4 (four) hours as needed.        . insulin regular (HUMULIN R,NOVOLIN R) 100 units/mL injection Inject into the skin 3 (three) times daily before meals.        . isosorbide mononitrate (IMDUR) 60 MG 24 hr tablet Take 60 mg by mouth daily.        . metFORMIN (GLUCOPHAGE) 500 MG tablet Take 500 mg by mouth. 1/2 tablet twice a day       .  methylcellulose oral powder Take by mouth daily.        . metoprolol (LOPRESSOR) 50 MG tablet Take 50 mg by mouth. Take 2 1/2 tablets daily ( 125mg )       . Multiple Vitamin (MULTIVITAMIN) tablet Take 1 tablet by mouth daily.        . pantoprazole (PROTONIX) 40 MG tablet Take 40 mg by mouth 2 (two) times daily.        . potassium chloride SA (K-DUR,KLOR-CON) 20 MEQ tablet Take 20 mEq by mouth daily.        Marland Kitchen warfarin (COUMADIN) 5 MG tablet Take 5 mg by mouth as directed.          Physical Examination  Filed Vitals:   01/09/12 1427  BP: 111/58  Pulse: 62  Resp: 18    Body mass index is 39.77 kg/(m^2).  General:  WDWN in NAD Gait: Normal HEENT: WNL Eyes: Pupils equal Pulmonary: normal non-labored breathing , without Rales, rhonchi,  wheezing Cardiac: RRR, without  Murmurs, rubs or gallops; No carotid bruits Abdomen: soft, NT, no masses Skin: no rashes, ulcers noted Vascular Exam/Pulses: Lower extremity pulses are not palpable  Extremities without  ischemic changes, no Gangrene , no cellulitis; no open wounds;  Musculoskeletal: no muscle wasting or atrophy  Neurologic: A&O X 3; Appropriate Affect ; SENSATION: normal; MOTOR FUNCTION:  moving all extremities equally. Speech is fluent/normal  Non-Invasive Vascular Imaging: ABIs are unreliable due to noncompressible vessels. The patient's TB eyes 0.44 on the right and 0.56 on the left the patient is monophasic on the right and triphasic on the left. This was reviewed with [name].  ASSESSMENT/PLAN: This patient is asymptomatic regarding vascular issues but he does have orthopedic pain. Since the patient does not particularly want to be monitored every year per [name] the patient can go to a when necessary status, the patient is in agreement with this, his questions were encouraged and answered. The patient will let us know if he has any difficulties in the future we'll be happy to see him back.  Lauree Chandler ANP  Clinic M.D.: [name]

## 2012-05-15 ENCOUNTER — Other Ambulatory Visit: Payer: Self-pay | Admitting: Family Medicine

## 2012-06-11 ENCOUNTER — Other Ambulatory Visit: Payer: Self-pay | Admitting: Family Medicine

## 2012-09-01 ENCOUNTER — Other Ambulatory Visit (HOSPITAL_COMMUNITY): Payer: Self-pay | Admitting: Cardiovascular Disease

## 2012-09-01 DIAGNOSIS — I70219 Atherosclerosis of native arteries of extremities with intermittent claudication, unspecified extremity: Secondary | ICD-10-CM

## 2012-09-04 ENCOUNTER — Other Ambulatory Visit: Payer: Self-pay | Admitting: Family Medicine

## 2012-09-10 ENCOUNTER — Other Ambulatory Visit: Payer: Self-pay | Admitting: Family Medicine

## 2012-09-18 ENCOUNTER — Encounter (HOSPITAL_COMMUNITY): Payer: Medicare Other

## 2012-09-22 ENCOUNTER — Other Ambulatory Visit (HOSPITAL_COMMUNITY): Payer: Self-pay | Admitting: Internal Medicine

## 2012-09-22 DIAGNOSIS — I6521 Occlusion and stenosis of right carotid artery: Secondary | ICD-10-CM

## 2012-09-26 ENCOUNTER — Ambulatory Visit (HOSPITAL_COMMUNITY)
Admission: RE | Admit: 2012-09-26 | Discharge: 2012-09-26 | Disposition: A | Discharge status: Home / Self Care | Payer: Medicare Other | Source: Ambulatory Visit | Attending: Internal Medicine | Admitting: Internal Medicine

## 2012-09-29 ENCOUNTER — Telehealth (HOSPITAL_COMMUNITY): Payer: Self-pay | Admitting: Cardiovascular Disease

## 2012-10-02 ENCOUNTER — Telehealth (HOSPITAL_COMMUNITY): Payer: Self-pay | Admitting: Interventional Radiology

## 2012-10-03 ENCOUNTER — Ambulatory Visit (HOSPITAL_COMMUNITY): Admission: RE | Admit: 2012-10-03 | Payer: Medicare Other | Source: Ambulatory Visit

## 2012-10-07 ENCOUNTER — Ambulatory Visit (HOSPITAL_COMMUNITY)
Admission: RE | Admit: 2012-10-07 | Discharge: 2012-10-07 | Disposition: A | Discharge status: Home / Self Care | Payer: Medicare Other | Source: Ambulatory Visit | Attending: Internal Medicine | Admitting: Internal Medicine

## 2012-10-07 ENCOUNTER — Other Ambulatory Visit (HOSPITAL_COMMUNITY): Payer: Self-pay | Admitting: Interventional Radiology

## 2012-10-07 DIAGNOSIS — I6521 Occlusion and stenosis of right carotid artery: Secondary | ICD-10-CM

## 2012-10-07 DIAGNOSIS — M542 Cervicalgia: Secondary | ICD-10-CM

## 2012-10-07 DIAGNOSIS — I639 Cerebral infarction, unspecified: Secondary | ICD-10-CM

## 2012-10-07 DIAGNOSIS — H538 Other visual disturbances: Secondary | ICD-10-CM

## 2012-10-20 ENCOUNTER — Other Ambulatory Visit (HOSPITAL_COMMUNITY): Payer: Self-pay | Admitting: Interventional Radiology

## 2012-11-21 ENCOUNTER — Telehealth: Payer: Self-pay | Admitting: Cardiovascular Disease

## 2012-12-22 ENCOUNTER — Telehealth (HOSPITAL_COMMUNITY): Payer: Self-pay | Admitting: Cardiovascular Disease

## 2012-12-22 NOTE — Telephone Encounter (Signed)
Mailed letter to patient regarding scheduling testing ordered by Dr. Alanda Amass

## 2012-12-26 ENCOUNTER — Other Ambulatory Visit (HOSPITAL_COMMUNITY): Payer: Self-pay | Admitting: Cardiovascular Disease

## 2012-12-26 DIAGNOSIS — I739 Peripheral vascular disease, unspecified: Secondary | ICD-10-CM

## 2012-12-30 ENCOUNTER — Other Ambulatory Visit (HOSPITAL_COMMUNITY): Payer: Self-pay | Admitting: Interventional Radiology

## 2012-12-30 DIAGNOSIS — H538 Other visual disturbances: Secondary | ICD-10-CM

## 2012-12-30 DIAGNOSIS — T82898D Other specified complication of vascular prosthetic devices, implants and grafts, subsequent encounter: Secondary | ICD-10-CM

## 2012-12-30 DIAGNOSIS — M542 Cervicalgia: Secondary | ICD-10-CM

## 2012-12-30 DIAGNOSIS — I739 Peripheral vascular disease, unspecified: Secondary | ICD-10-CM

## 2013-01-08 ENCOUNTER — Telehealth (HOSPITAL_COMMUNITY): Payer: Self-pay | Admitting: Interventional Radiology

## 2013-01-08 NOTE — Telephone Encounter (Signed)
Called pt schedule MRI no VM available JMichaux

## 2013-01-14 ENCOUNTER — Other Ambulatory Visit (HOSPITAL_COMMUNITY): Payer: Self-pay | Admitting: Interventional Radiology

## 2013-01-14 ENCOUNTER — Ambulatory Visit (HOSPITAL_COMMUNITY)
Admission: RE | Admit: 2013-01-14 | Discharge: 2013-01-14 | Disposition: A | Discharge status: Home / Self Care | Payer: Medicare Other | Source: Ambulatory Visit | Attending: Cardiovascular Disease | Admitting: Cardiovascular Disease

## 2013-01-14 DIAGNOSIS — I739 Peripheral vascular disease, unspecified: Secondary | ICD-10-CM

## 2013-01-16 ENCOUNTER — Ambulatory Visit (HOSPITAL_COMMUNITY)
Admission: RE | Admit: 2013-01-16 | Discharge: 2013-01-16 | Disposition: A | Discharge status: Home / Self Care | Payer: Medicare Other | Source: Ambulatory Visit | Attending: Cardiovascular Disease | Admitting: Cardiovascular Disease

## 2013-01-16 ENCOUNTER — Other Ambulatory Visit (HOSPITAL_COMMUNITY): Payer: Self-pay | Admitting: Cardiovascular Disease

## 2013-01-16 DIAGNOSIS — I739 Peripheral vascular disease, unspecified: Secondary | ICD-10-CM

## 2013-01-16 NOTE — Progress Notes (Signed)
Lower Extremity Arterial Duplex Completed. °Brianna L Mazza,RVT °

## 2013-01-23 ENCOUNTER — Ambulatory Visit (HOSPITAL_COMMUNITY): Admission: RE | Admit: 2013-01-23 | Payer: Medicare Other | Source: Ambulatory Visit

## 2013-01-23 ENCOUNTER — Ambulatory Visit (HOSPITAL_COMMUNITY)
Admission: RE | Admit: 2013-01-23 | Discharge: 2013-01-23 | Disposition: A | Discharge status: Home / Self Care | Payer: Medicare Other | Source: Ambulatory Visit | Attending: Interventional Radiology | Admitting: Interventional Radiology

## 2013-01-23 ENCOUNTER — Ambulatory Visit (HOSPITAL_COMMUNITY): Payer: Medicare Other

## 2013-01-23 DIAGNOSIS — I70219 Atherosclerosis of native arteries of extremities with intermittent claudication, unspecified extremity: Secondary | ICD-10-CM | POA: Insufficient documentation

## 2013-01-23 DIAGNOSIS — H538 Other visual disturbances: Secondary | ICD-10-CM

## 2013-01-23 DIAGNOSIS — T82898D Other specified complication of vascular prosthetic devices, implants and grafts, subsequent encounter: Secondary | ICD-10-CM

## 2013-01-23 DIAGNOSIS — I700 Atherosclerosis of aorta: Secondary | ICD-10-CM | POA: Insufficient documentation

## 2013-01-23 DIAGNOSIS — I739 Peripheral vascular disease, unspecified: Secondary | ICD-10-CM

## 2013-01-23 DIAGNOSIS — M542 Cervicalgia: Secondary | ICD-10-CM

## 2013-01-23 DIAGNOSIS — I708 Atherosclerosis of other arteries: Secondary | ICD-10-CM | POA: Insufficient documentation

## 2013-01-23 LAB — CREATININE, SERUM
Creatinine, Ser: 1.34 mg/dL (ref 0.50–1.35)
GFR calc Af Amer: 59 mL/min — ABNORMAL LOW (ref >90)
GFR calc non Af Amer: 51 mL/min — ABNORMAL LOW (ref >90)

## 2013-01-23 MED ORDER — GADOBENATE DIMEGLUMINE 529 MG/ML IV SOLN
30.0000 mL | Freq: Once | INTRAVENOUS | Status: AC | PRN
  Administered 2013-01-23: 30 mL via INTRAVENOUS

## 2013-01-28 ENCOUNTER — Other Ambulatory Visit: Payer: Self-pay | Admitting: Cardiovascular Disease

## 2013-01-28 LAB — HEMOGLOBIN A1C
Hgb A1c MFr Bld: 10.5 % — ABNORMAL HIGH (ref <5.7)
Mean Plasma Glucose: 255 mg/dL — ABNORMAL HIGH (ref <117)

## 2013-01-29 ENCOUNTER — Other Ambulatory Visit (HOSPITAL_COMMUNITY): Payer: Self-pay | Admitting: Cardiovascular Disease

## 2013-01-29 DIAGNOSIS — R011 Cardiac murmur, unspecified: Secondary | ICD-10-CM

## 2013-01-29 DIAGNOSIS — I350 Nonrheumatic aortic (valve) stenosis: Secondary | ICD-10-CM

## 2013-01-29 LAB — COMPREHENSIVE METABOLIC PANEL
ALT: 16 U/L (ref 0–53)
AST: 23 U/L (ref 0–37)
Albumin: 4.1 g/dL (ref 3.5–5.2)
Alkaline Phosphatase: 66 U/L (ref 39–117)
BUN: 37 mg/dL — ABNORMAL HIGH (ref 6–23)
CO2: 36 mEq/L — ABNORMAL HIGH (ref 19–32)
Calcium: 9.9 mg/dL (ref 8.4–10.5)
Chloride: 89 mEq/L — ABNORMAL LOW (ref 96–112)
Creat: 1.66 mg/dL — ABNORMAL HIGH (ref 0.50–1.35)
Glucose, Bld: 249 mg/dL — ABNORMAL HIGH (ref 70–99)
Potassium: 3.5 mEq/L (ref 3.5–5.3)
Sodium: 134 mEq/L — ABNORMAL LOW (ref 135–145)
Total Bilirubin: 0.3 mg/dL (ref 0.3–1.2)
Total Protein: 7.8 g/dL (ref 6.0–8.3)

## 2013-01-29 LAB — TSH: TSH: 3.598 u[IU]/mL (ref 0.350–4.500)

## 2013-01-29 LAB — LIPID PANEL
Cholesterol: 140 mg/dL (ref 0–200)
HDL: 24 mg/dL — ABNORMAL LOW (ref >39)
Total CHOL/HDL Ratio: 5.8 Ratio
Triglycerides: 525 mg/dL — ABNORMAL HIGH (ref <150)

## 2013-01-29 LAB — LDL CHOLESTEROL, DIRECT: Direct LDL: 43 mg/dL

## 2013-02-03 ENCOUNTER — Other Ambulatory Visit: Payer: Self-pay | Admitting: *Deleted

## 2013-02-03 ENCOUNTER — Encounter: Payer: Self-pay | Admitting: Vascular Surgery

## 2013-02-04 ENCOUNTER — Ambulatory Visit (HOSPITAL_COMMUNITY)
Admission: RE | Admit: 2013-02-04 | Discharge: 2013-02-04 | Disposition: A | Discharge status: Home / Self Care | Payer: Medicare Other | Source: Ambulatory Visit | Attending: Cardiovascular Disease | Admitting: Cardiovascular Disease

## 2013-02-04 ENCOUNTER — Encounter (HOSPITAL_COMMUNITY): Payer: Medicare Other

## 2013-02-04 ENCOUNTER — Ambulatory Visit (INDEPENDENT_AMBULATORY_CARE_PROVIDER_SITE_OTHER): Payer: Medicare Other | Admitting: Vascular Surgery

## 2013-02-04 ENCOUNTER — Encounter: Payer: Self-pay | Admitting: Vascular Surgery

## 2013-02-04 ENCOUNTER — Other Ambulatory Visit (INDEPENDENT_AMBULATORY_CARE_PROVIDER_SITE_OTHER): Payer: Medicare Other | Admitting: *Deleted

## 2013-02-04 ENCOUNTER — Other Ambulatory Visit: Payer: Medicare Other

## 2013-02-04 DIAGNOSIS — R011 Cardiac murmur, unspecified: Secondary | ICD-10-CM | POA: Insufficient documentation

## 2013-02-04 DIAGNOSIS — I6529 Occlusion and stenosis of unspecified carotid artery: Secondary | ICD-10-CM

## 2013-02-04 NOTE — Progress Notes (Signed)
[facility] [place]  Patient name: Isaiah [name] MRN: [id number] DOB: 10/11/1940 Sex: male  REASON FOR VISIT: carotid disease. Referred by Dr. Susa Griffins.  HPI: Isaiah [name] is a 72 y.o. male who in May had an episode of transient loss of vision in both eyes. He had been having problems with low blood pressure and dizziness also. His workup Bactroban included a carotid duplex scan which was done on 09/20/2012. This showed a 50-69% left internal carotid artery stenosis and a 50-69% right internal carotid artery stenosis. His workup also included a CT angiogram which was interpreted as showing a greater than 80% right carotid stenosis. This was done around the hospital in [place] and I have the report but am unable to review the actual films.  On my history, he denies any history of stroke, TIAs, expressive or receptive aphasia, or amaurosis fugax.  Past Medical History  Diagnosis Date  . Diabetes mellitus   . Hypertension   . Hyperlipidemia   . CAD (coronary artery disease)   . Atrial fibrillation   . CHF (congestive heart failure)   . Stroke   . Peripheral vascular disease   . Myocardial infarction    Family History  Problem Relation Age of Onset  . Heart disease Mother   . Other Mother     varicose veins  . Cancer Father   . Diabetes Father   . Heart disease Father     before age 9  . Hyperlipidemia Father   . Hypertension Father   . Other Father     varicose veins  . Diabetes Sister   . Heart disease Sister   . Other Sister     varicose veins  . Other Brother     varicose veins  . Diabetes Daughter   . Hyperlipidemia Daughter   . Hypertension Daughter   . Other Daughter     varicose veins   SOCIAL HISTORY: History  Substance Use Topics  . Smoking status: Former Smoker    Types: Cigarettes    Quit date: 05/14/1978  . Smokeless tobacco: Not on file  . Alcohol Use: No   No Known Allergies  Current Outpatient Prescriptions   Medication Sig Dispense Refill  . allopurinol (ZYLOPRIM) 100 MG tablet Take 100 mg by mouth daily.       Marland Kitchen aspirin 81 MG tablet Take 81 mg by mouth daily.        . cloNIDine (CATAPRES) 0.1 MG tablet Take 1 tablet by mouth daily.      . enalapril (VASOTEC) 20 MG tablet Take 20 mg by mouth daily. 1 1/2 tablets daily        . furosemide (LASIX) 40 MG tablet Take 80 mg by mouth 2 (two) times daily.       Marland Kitchen gabapentin (NEURONTIN) 100 MG capsule Take 1 capsule by mouth 2 (two) times daily.      Marland Kitchen glipiZIDE (GLUCOTROL XL) 10 MG 24 hr tablet Take 10 mg by mouth 2 (two) times daily.        . hydrALAZINE (APRESOLINE) 50 MG tablet Take 1 tablet by mouth 2 (two) times daily.      . insulin regular (HUMULIN R,NOVOLIN R) 100 units/mL injection Inject 10 Units into the skin 3 (three) times daily before meals.       . isosorbide mononitrate (IMDUR) 60 MG 24 hr tablet Take 60 mg by mouth daily.        . methylcellulose oral powder  Take by mouth daily.        . metolazone (ZAROXOLYN) 2.5 MG tablet Take 2.5 mg by mouth as needed.      . metoprolol (LOPRESSOR) 50 MG tablet Take 50 mg by mouth. Take 2 1/2 tablets daily ( 125mg )       . Multiple Vitamin (MULTIVITAMIN) tablet Take 1 tablet by mouth daily.        . pantoprazole (PROTONIX) 40 MG tablet Take 40 mg by mouth 2 (two) times daily.        . potassium chloride SA (K-DUR,KLOR-CON) 20 MEQ tablet Take 20 mEq by mouth daily.        . pravastatin (PRAVACHOL) 40 MG tablet Take 40 mg by mouth daily.      . traZODone (DESYREL) 50 MG tablet Take 1 tablet by mouth daily.      Marland Kitchen warfarin (COUMADIN) 5 MG tablet Take 5 mg by mouth as directed.        . doxycycline (VIBRAMYCIN) 100 MG capsule Take 100 mg by mouth 2 (two) times daily.        Marland Kitchen HYDROcodone-acetaminophen (VICODIN) 5-500 MG per tablet Take 1 tablet by mouth every 4 (four) hours as needed.        . metFORMIN (GLUCOPHAGE) 500 MG tablet Take 500 mg by mouth. 1/2 tablet twice a day        No current  facility-administered medications for this visit.   REVIEW OF SYSTEMS: Arly.Keller ] denotes positive finding; [  ] denotes negative finding  CARDIOVASCULAR:  [ ]  chest pain   [ ]  chest pressure   [ ]  palpitations   Arly.Keller ] orthopnea   Arly.Keller ] dyspnea on exertion   Arly.Keller ] claudication   [ ]  rest pain   [ ]  DVT   [ ]  phlebitis PULMONARY:   [ ]  productive cough   [ ]  asthma   [ ]  wheezing NEUROLOGIC:   [ ]  weakness  [ ]  paresthesias  [ ]  aphasia  [ ]  amaurosis  Arly.Keller ] dizziness HEMATOLOGIC:   [ ]  bleeding problems   [ ]  clotting disorders MUSCULOSKELETAL:  [ ]  joint pain   [ ]  joint swelling [ ]  leg swelling GASTROINTESTINAL: [ ]   blood in stool  [ ]   hematemesis GENITOURINARY:  [ ]   dysuria  [ ]   hematuria PSYCHIATRIC:  [ ]  history of major depression INTEGUMENTARY:  [ ]  rashes  [ ]  ulcers CONSTITUTIONAL:  [ ]  fever   [ ]  chills  PHYSICAL EXAM: Filed Vitals:   02/04/13 1126  BP: 88/34  Pulse: 50  Height: 5' 7.8" (1.722 m)  Weight: 252 lb (114.306 kg)  SpO2: 98%   Body mass index is 38.55 kg/(m^2). GENERAL: The patient is a well-nourished male, in no acute distress. The vital signs are documented above. CARDIOVASCULAR: There is a regular rate and rhythm. He has a right carotid bruit. I cannot palpate a right femoral pulse. He has a diminished left femoral pulse. I cannot palpate pedal pulses. However both feet are warm and well-perfused. He has no significant lower extremity swelling. PULMONARY: There is good air exchange bilaterally without wheezing or rales. ABDOMEN: Soft and non-tender with normal pitched bowel sounds.  MUSCULOSKELETAL: There are no major deformities or cyanosis. NEUROLOGIC: No focal weakness or paresthesias are detected. SKIN: There are no ulcers or rashes noted. PSYCHIATRIC: The patient has a normal affect.  DATA:  I have independently interpreted the carotid duplex scan in our office today.  He has a 40-59% right carotid stenosis with a less than 40% left carotid stenosis. He has  a short neck and therefore the study was technically challenging.  MEDICAL ISSUES:  Occlusion and stenosis of carotid artery without mention of cerebral infarction This patient has evidence of a moderate right carotid stenosis which is asymptomatic and no significant left carotid stenosis. Although the CT angiogram suggested a greater than 80% right carotid stenosis I have found this to be less reliable than the duplex. I have recommended a follow up carotid duplex scan in 6 months and I'll see him back at that time. He knows to call sooner if he has problems. In the meantime he remains on his aspirin. If the stenosis on the right does progressed to greater than 80% or he develops clear-cut right hemispheric symptoms that I would agree with Dr. Alanda Amass that he might best be served with carotid stenting. However currently given that the stenosis is only moderate and he is asymptomatic I would not recommend intervention at this time.   DICKSON,CHRISTOPHER S [facility] [place] Beeper: [phone]

## 2013-02-04 NOTE — Assessment & Plan Note (Signed)
This patient has evidence of a moderate right carotid stenosis which is asymptomatic and no significant left carotid stenosis. Although the CT angiogram suggested a greater than 80% right carotid stenosis I have found this to be less reliable than the duplex. I have recommended a follow up carotid duplex scan in 6 months and I'll see him back at that time. He knows to call sooner if he has problems. In the meantime he remains on his aspirin. If the stenosis on the right does progressed to greater than 80% or he develops clear-cut right hemispheric symptoms that I would agree with Dr. Alanda Amass that he might best be served with carotid stenting. However currently given that the stenosis is only moderate and he is asymptomatic I would not recommend intervention at this time.

## 2013-02-04 NOTE — Progress Notes (Signed)
2D Echo Performed 02/04/2013    Zahid Carneiro, RCS  

## 2013-02-06 ENCOUNTER — Encounter: Payer: Self-pay | Admitting: Cardiovascular Disease

## 2013-02-11 NOTE — Addendum Note (Signed)
Addended by: Sharee Pimple on: 02/11/2013 01:15 PM   Modules accepted: Orders

## 2013-03-19 ENCOUNTER — Other Ambulatory Visit: Payer: Self-pay

## 2013-03-26 ENCOUNTER — Other Ambulatory Visit (HOSPITAL_COMMUNITY): Payer: Self-pay | Admitting: Interventional Radiology

## 2013-03-31 ENCOUNTER — Inpatient Hospital Stay (HOSPITAL_COMMUNITY): Admission: RE | Admit: 2013-03-31 | Payer: Medicare Other | Source: Ambulatory Visit

## 2013-07-24 ENCOUNTER — Telehealth: Payer: Self-pay | Admitting: *Deleted

## 2013-07-24 NOTE — Telephone Encounter (Signed)
Called pt. Re: flu shot. Voice mail not set up yet. Will try again later. Isaiah [name].Isaiah [name], Isaiah [name]

## 2013-07-27 NOTE — Telephone Encounter (Signed)
Called pt again. No voice mail. No upcoming appt. Lorenda Hatchet.Gayla Benn, Renato Battleshekla

## 2013-08-04 ENCOUNTER — Encounter: Payer: Self-pay | Admitting: Vascular Surgery

## 2013-08-05 ENCOUNTER — Ambulatory Visit (HOSPITAL_COMMUNITY)
Admission: RE | Admit: 2013-08-05 | Discharge: 2013-08-05 | Disposition: A | Discharge status: Home / Self Care | Payer: Medicare Other | Source: Ambulatory Visit | Attending: Vascular Surgery | Admitting: Vascular Surgery

## 2013-08-05 ENCOUNTER — Encounter: Payer: Self-pay | Admitting: Vascular Surgery

## 2013-08-05 ENCOUNTER — Ambulatory Visit (INDEPENDENT_AMBULATORY_CARE_PROVIDER_SITE_OTHER): Payer: Medicare Other | Admitting: Vascular Surgery

## 2013-08-05 VITALS — BP 149/59 | Ht 67.0 in | Wt 247.8 lb

## 2013-08-05 DIAGNOSIS — I6529 Occlusion and stenosis of unspecified carotid artery: Secondary | ICD-10-CM | POA: Insufficient documentation

## 2013-08-05 DIAGNOSIS — Z48812 Encounter for surgical aftercare following surgery on the circulatory system: Secondary | ICD-10-CM

## 2013-08-05 NOTE — Addendum Note (Signed)
Addended by: Adria DillELDRIDGE-LEWIS, Ersie Savino L on: 08/05/2013 02:35 PM   Modules accepted: Orders

## 2013-08-05 NOTE — Assessment & Plan Note (Signed)
He has an asymptomatic 40-59 0 right carotid stenosis. This has remained stable. This reason I think it is safe to stretch his follow up out to one year. I've ordered a fall carotid duplex scan in 1 year and we will see him back at that time. He knows to call sooner if he has problems. He is on aspirin. He is on a statin.

## 2013-08-05 NOTE — Progress Notes (Signed)
[facility] [place]  Patient name: Isaiah [name] MRN: [id number] DOB: 11-05-1940 Sex: male  REASON FOR VISIT: Follow up of carotid disease  HPI: Isaiah [name] is a 73 y.o. male who I last saw on 02/04/2013.  He had previously had a CT angiogram which suggested a greater than 80% right carotid stenosis however we did not find this on duplex back in September. He comes in for a 6 month follow up visit. Bactroban he had a 40-59% right carotid stenosis and less than 40% left carotid stenosis.  He remains asymptomatic. He denies any history of stroke, TIAs, expressive or receptive aphasia, or amaurosis fugax.  He is on aspirin. He is on a statin.  Past Medical History  Diagnosis Date  . Diabetes mellitus   . Hypertension   . Hyperlipidemia   . CAD (coronary artery disease)   . Atrial fibrillation   . CHF (congestive heart failure)   . Stroke   . Peripheral vascular disease   . Myocardial infarction    Family History  Problem Relation Age of Onset  . Heart disease Mother   . Other Mother     varicose veins  . Cancer Father   . Diabetes Father   . Heart disease Father     before age 70  . Hyperlipidemia Father   . Hypertension Father   . Other Father     varicose veins  . Diabetes Sister   . Heart disease Sister   . Other Sister     varicose veins  . Other Brother     varicose veins  . Diabetes Daughter   . Hyperlipidemia Daughter   . Hypertension Daughter   . Other Daughter     varicose veins   SOCIAL HISTORY: History  Substance Use Topics  . Smoking status: Former Smoker    Types: Cigarettes    Quit date: 05/14/1978  . Smokeless tobacco: Not on file  . Alcohol Use: No   No Known Allergies Current Outpatient Prescriptions  Medication Sig Dispense Refill  . allopurinol (ZYLOPRIM) 100 MG tablet Take 100 mg by mouth daily.       Marland Kitchen aspirin 81 MG tablet Take 81 mg by mouth daily.        . cloNIDine (CATAPRES) 0.1 MG tablet Take 1 tablet  by mouth daily.      . enalapril (VASOTEC) 20 MG tablet Take 20 mg by mouth daily. 1 1/2 tablets daily        . furosemide (LASIX) 40 MG tablet Take 80 mg by mouth 2 (two) times daily.       Marland Kitchen gabapentin (NEURONTIN) 100 MG capsule Take 1 capsule by mouth 3 (three) times daily.       Marland Kitchen glipiZIDE (GLUCOTROL XL) 10 MG 24 hr tablet Take 10 mg by mouth 2 (two) times daily.        . hydrALAZINE (APRESOLINE) 50 MG tablet Take 1 tablet by mouth 2 (two) times daily.      Marland Kitchen HYDROcodone-acetaminophen (VICODIN) 5-500 MG per tablet Take 1 tablet by mouth every 4 (four) hours as needed.        . insulin regular (HUMULIN R,NOVOLIN R) 100 units/mL injection Inject 10 Units into the skin 3 (three) times daily before meals. 120 units twice daily and 60 units at bedtime.      . isosorbide mononitrate (IMDUR) 60 MG 24 hr tablet Take 60 mg by mouth daily.        Marland Kitchen  methylcellulose oral powder Take by mouth daily.        . metolazone (ZAROXOLYN) 2.5 MG tablet Take 2.5 mg by mouth as needed.      . metoprolol (LOPRESSOR) 50 MG tablet Take 50 mg by mouth. Take 2 1/2 tablets daily ( 125mg )       . Multiple Vitamin (MULTIVITAMIN) tablet Take 1 tablet by mouth daily.        . pantoprazole (PROTONIX) 40 MG tablet Take 40 mg by mouth 2 (two) times daily.        . pravastatin (PRAVACHOL) 40 MG tablet Take 40 mg by mouth daily.      . traZODone (DESYREL) 50 MG tablet Take 1 tablet by mouth daily.      Marland Kitchen. warfarin (COUMADIN) 5 MG tablet Take 5 mg by mouth as directed.        . doxycycline (VIBRAMYCIN) 100 MG capsule Take 100 mg by mouth 2 (two) times daily.        . metFORMIN (GLUCOPHAGE) 500 MG tablet Take 500 mg by mouth. 1/2 tablet twice a day       . potassium chloride SA (K-DUR,KLOR-CON) 20 MEQ tablet Take 20 mEq by mouth daily.         No current facility-administered medications for this visit.   REVIEW OF SYSTEMS: Arly.Keller[X ] denotes positive finding; [  ] denotes negative finding  CARDIOVASCULAR:  [ ]  chest pain   [ ]   chest pressure   [ ]  palpitations   Arly.Keller[X ] orthopnea   Arly.Keller[X ] dyspnea on exertion   Arly.Keller[X ] claudication   [ ]  rest pain   [ ]  DVT   [ ]  phlebitis PULMONARY:   [ ]  productive cough   [ ]  asthma   [ ]  wheezing NEUROLOGIC:   Arly.Keller[X ] weakness  Arly.Keller[X ] paresthesias  [ ]  aphasia  [ ]  amaurosis  Arly.Keller[X ] dizziness HEMATOLOGIC:   [ ]  bleeding problems   [ ]  clotting disorders MUSCULOSKELETAL:  [ ]  joint pain   [ ]  joint swelling Arly.Keller[X ] leg swelling GASTROINTESTINAL: [ ]   blood in stool  [ ]   hematemesis GENITOURINARY:  [ ]   dysuria  [ ]   hematuria PSYCHIATRIC:  [ ]  history of major depression INTEGUMENTARY:  [ ]  rashes  [ ]  ulcers CONSTITUTIONAL:  [ ]  fever   [ ]  chills  PHYSICAL EXAM: Filed Vitals:   08/05/13 1208  BP: 149/59  Height: 5\' 7"  (1.702 m)  Weight: 247 lb 12.8 oz (112.401 kg)   Body mass index is 38.8 kg/(m^2). GENERAL: The patient is a well-nourished male, in no acute distress. The vital signs are documented above. CARDIOVASCULAR: There is a regular rate and rhythm. He has a right carotid bruit. PULMONARY: There is good air exchange bilaterally without wheezing or rales. ABDOMEN: Soft and non-tender with normal pitched bowel sounds.  MUSCULOSKELETAL: There are no major deformities or cyanosis. NEUROLOGIC: No focal weakness or paresthesias are detected. SKIN: There are no ulcers or rashes noted. PSYCHIATRIC: The patient has a normal affect.  DATA:  I have independently interpreted his carotid duplex scan which shows that he has a 40-59% right carotid stenosis with no significant stenosis on the left. There has been no significant change compared to a study in September.  MEDICAL ISSUES: Occlusion and stenosis of carotid artery without mention of cerebral infarction He has an asymptomatic 40-59 0 right carotid stenosis. This has remained stable. This reason I think it is safe  to stretch his follow up out to one year. I've ordered a fall carotid duplex scan in 1 year and we will see him back at  that time. He knows to call sooner if he has problems. He is on aspirin. He is on a statin.    Return in about 1 year (around 08/06/2014).   Ezra Denne S [facility] [place] Beeper: [phone]

## 2013-08-11 LAB — PROTIME-INR: INR: 2.5 — AB (ref 0.9–1.1)

## 2013-09-02 NOTE — Telephone Encounter (Signed)
CLOSED ENCOUNTER BY TINA P 

## 2013-09-16 ENCOUNTER — Telehealth: Payer: Self-pay | Admitting: Cardiovascular Disease

## 2013-09-16 NOTE — Telephone Encounter (Signed)
Returned call and pt's son, Isaiah [name] not able to verify pt's DOB or address.  Stated he will call pt and have him call back

## 2013-09-16 NOTE — Telephone Encounter (Signed)
Pt still have not gotten his medicine,his pharmacist told him to call. Please call his Allopurnolol 100mg  #60. Please call to [phone].

## 2013-09-17 NOTE — Telephone Encounter (Signed)
Returned call and spoke w/ Isaiah [name].  Stated he told pt to call back yesterday and thinks he called [facility].  Son did verify pt's DOB.  RN asked him to inform pt that he will need to get allopurinol from PCP and call office for an appt if he'd like to continue care here.  Son verbalized understanding and agreed to inform pt.

## 2013-09-18 ENCOUNTER — Other Ambulatory Visit: Payer: Self-pay | Admitting: *Deleted

## 2013-09-18 MED ORDER — ALLOPURINOL 100 MG PO TABS: 100.0000 mg | ORAL_TABLET | Freq: Every day | ORAL | Status: DC

## 2013-09-18 MED ORDER — ISOSORBIDE MONONITRATE ER 60 MG PO TB24: 60.0000 mg | ORAL_TABLET | Freq: Every day | ORAL | Status: DC

## 2013-09-18 NOTE — Telephone Encounter (Signed)
Rx refill sent to patient pharmacy   

## 2014-02-08 ENCOUNTER — Other Ambulatory Visit: Payer: Self-pay | Admitting: Cardiovascular Disease

## 2014-02-08 NOTE — Telephone Encounter (Signed)
Rx was sent to pharmacy electronically. 

## 2014-02-26 ENCOUNTER — Other Ambulatory Visit: Payer: Self-pay

## 2014-03-15 ENCOUNTER — Other Ambulatory Visit: Payer: Self-pay | Admitting: Cardiovascular Disease

## 2014-03-15 NOTE — Telephone Encounter (Signed)
Rx was sent to pharmacy electronically. 

## 2014-04-27 ENCOUNTER — Encounter: Payer: Self-pay | Admitting: Cardiovascular Disease

## 2014-04-27 ENCOUNTER — Ambulatory Visit (INDEPENDENT_AMBULATORY_CARE_PROVIDER_SITE_OTHER): Payer: Medicare Other | Admitting: Cardiovascular Disease

## 2014-04-27 VITALS — BP 138/66 | HR 59 | Ht 67.0 in | Wt 252.6 lb

## 2014-04-27 DIAGNOSIS — I482 Chronic atrial fibrillation, unspecified: Secondary | ICD-10-CM | POA: Insufficient documentation

## 2014-04-27 DIAGNOSIS — I2583 Coronary atherosclerosis due to lipid rich plaque: Secondary | ICD-10-CM

## 2014-04-27 DIAGNOSIS — I639 Cerebral infarction, unspecified: Secondary | ICD-10-CM

## 2014-04-27 DIAGNOSIS — I7092 Chronic total occlusion of artery of the extremities: Secondary | ICD-10-CM

## 2014-04-27 DIAGNOSIS — Z7901 Long term (current) use of anticoagulants: Secondary | ICD-10-CM

## 2014-04-27 DIAGNOSIS — I6529 Occlusion and stenosis of unspecified carotid artery: Secondary | ICD-10-CM

## 2014-04-27 DIAGNOSIS — I2510 Atherosclerotic heart disease of native coronary artery without angina pectoris: Secondary | ICD-10-CM

## 2014-04-27 DIAGNOSIS — E1159 Type 2 diabetes mellitus with other circulatory complications: Secondary | ICD-10-CM | POA: Insufficient documentation

## 2014-04-27 DIAGNOSIS — G4733 Obstructive sleep apnea (adult) (pediatric): Secondary | ICD-10-CM

## 2014-04-27 DIAGNOSIS — I10 Essential (primary) hypertension: Secondary | ICD-10-CM

## 2014-04-27 DIAGNOSIS — Z79899 Other long term (current) drug therapy: Secondary | ICD-10-CM

## 2014-04-27 DIAGNOSIS — E785 Hyperlipidemia, unspecified: Secondary | ICD-10-CM

## 2014-04-27 LAB — LIPID PANEL
Cholesterol: 112 mg/dL (ref 0–200)
HDL: 29 mg/dL — ABNORMAL LOW (ref >39)
LDL Cholesterol: 26 mg/dL (ref 0–99)
Total CHOL/HDL Ratio: 3.9 Ratio
Triglycerides: 287 mg/dL — ABNORMAL HIGH (ref <150)
VLDL: 57 mg/dL — ABNORMAL HIGH (ref 0–40)

## 2014-04-27 LAB — HEPATIC FUNCTION PANEL
ALT: 18 U/L (ref 0–53)
AST: 27 U/L (ref 0–37)
Albumin: 4.5 g/dL (ref 3.5–5.2)
Alkaline Phosphatase: 54 U/L (ref 39–117)
Bilirubin, Direct: 0.1 mg/dL (ref 0.0–0.3)
Indirect Bilirubin: 0.3 mg/dL (ref 0.2–1.2)
Total Bilirubin: 0.4 mg/dL (ref 0.2–1.2)
Total Protein: 8.2 g/dL (ref 6.0–8.3)

## 2014-04-27 NOTE — Assessment & Plan Note (Signed)
Patient has a history of hyperlipidemia on pravastatin 40 mg a day. We will recheck a lipid and liver profile and maintain current medications

## 2014-04-27 NOTE — Progress Notes (Signed)
04/27/2014 Isaiah [name]   Jun 06, 1940  [id number]  Primary Physician Pcp Not In System Primary Cardiologist: Isaiah Gess MD Isaiah [name]   HPI:  Isaiah [name] is a 73 year old moderately overweight divorced Caucasian male father of 5 children, grandfather of over 20 grandchildren who is accompanied by his son Isaiah [name] today. He was formerly a patient of Dr. Kandis [name] who saw him one year ago. I am assuming his care. His primary healthcare provider is Isaiah [name] at the [facility] in [place]. He has a long complicated past medical history. His risk factors include remote tobacco abuse having quit 20 years ago, diabetes, hypertension, hyperlipidemia. He had an anterior wall myocardial infarction back in 1994 treated with IV streptokinase, and angioplasty. Several years later he had stenting of his LAD which ultimately developed "in-stent restenosis requiring multivessel coronary artery bypass grafting in 1997. He has not required angiography since. His last Myoview performed using was nonischemic. His other problems include peripheral vascular disease with a known occluded right external iliac artery, remote history of left to right femorofemoral crossover grafting by Dr. Marcy [name]. This ultimately became infected and was removed by Dr. Cari [name] with patch angioplasty. He denies claudication. He has had a stroke in the past and has a right internal carotid artery stenosis by CT angiography which has been followed by 2 puffs ultrasound as well. His other problems include chronic atrial fibrillation on Coumadin anticoagulation, obstructive sleep apnea on C Pap, and venous insufficiency. Since he saw Dr. Alanda [name] back one year ago he has not required any hospitalizations and has been doing well clinically.   Current Outpatient Prescriptions  Medication Sig Dispense Refill  . allopurinol (ZYLOPRIM) 100 MG tablet Take 2 tablets (200 mg total) by mouth daily. NEED APPOINTMENT FOR  FUTURE REFILLS. 30 tablet 0  . aspirin 81 MG tablet Take 81 mg by mouth daily.      . cloNIDine (CATAPRES) 0.1 MG tablet Take 1 tablet by mouth daily.    . enalapril (VASOTEC) 20 MG tablet Take 10 mg by mouth daily.     . furosemide (LASIX) 40 MG tablet Take 80 mg by mouth 2 (two) times daily.     Marland Kitchen gabapentin (NEURONTIN) 100 MG capsule Take 1 capsule by mouth 3 (three) times daily.     Marland Kitchen glipiZIDE (GLUCOTROL XL) 10 MG 24 hr tablet Take 10 mg by mouth 2 (two) times daily.      . hydrALAZINE (APRESOLINE) 50 MG tablet Take 1 tablet by mouth 2 (two) times daily.    . insulin regular (HUMULIN R,NOVOLIN R) 100 units/mL injection Inject 10 Units into the skin 3 (three) times daily before meals. 120 units twice daily and 60 units at bedtime.    . isosorbide mononitrate (IMDUR) 60 MG 24 hr tablet Take 1 tablet (60 mg total) by mouth daily. NEED APPOINTMENT FOR FUTURE REFILLS. 15 tablet 0  . methylcellulose oral powder Take by mouth daily.      . metolazone (ZAROXOLYN) 2.5 MG tablet Take 2.5 mg by mouth as needed.    . metoprolol (LOPRESSOR) 50 MG tablet Take 50 mg by mouth. Take 2 1/2 tablets daily ( 125mg )     . Multiple Vitamin (MULTIVITAMIN) tablet Take 1 tablet by mouth daily.      . pravastatin (PRAVACHOL) 40 MG tablet Take 40 mg by mouth daily.    Marland Kitchen spironolactone (ALDACTONE) 25 MG tablet Take 25 mg by mouth daily.    Marland Kitchen warfarin (COUMADIN)  5 MG tablet Take 5 mg by mouth as directed.       No current facility-administered medications for this visit.    No Known Allergies  History   Social History  . Marital Status: Divorced    Spouse Name: N/A    Number of Children: N/A  . Years of Education: N/A   Occupational History  . Not on file.   Social History Main Topics  . Smoking status: Former Smoker    Types: Cigarettes    Quit date: 05/14/1978  . Smokeless tobacco: Not on file  . Alcohol Use: No  . Drug Use: No  . Sexual Activity: Not on file   Other Topics Concern  . Not on  file   Social History Narrative     Review of Systems: General: negative for chills, fever, night sweats or weight changes.  Cardiovascular: negative for chest pain, dyspnea on exertion, edema, orthopnea, palpitations, paroxysmal nocturnal dyspnea or shortness of breath Dermatological: negative for rash Respiratory: negative for cough or wheezing Urologic: negative for hematuria Abdominal: negative for nausea, vomiting, diarrhea, bright red blood per rectum, melena, or hematemesis Neurologic: negative for visual changes, syncope, or dizziness All other systems reviewed and are otherwise negative except as noted above.    Blood pressure 138/66, pulse 59, height 5\' 7"  (1.702 m), weight 252 lb 9.6 oz (114.579 kg).  General appearance: alert and no distress Neck: no adenopathy, no JVD, supple, symmetrical, trachea midline, thyroid not enlarged, symmetric, no tenderness/mass/nodules and soft right carotid bruit Lungs: clear to auscultation bilaterally Heart: regular rate and rhythm, S1, S2 normal, no murmur, click, rub or gallop Extremities: extremities normal, atraumatic, no cyanosis or edema, venous stasis dermatitis noted and changes of venous stasis with trace edema  EKG atrial fibrillation with rapid ventricular response of 59 and nonspecific ST and T-wave changes. I personally reviewed this EKG  ASSESSMENT AND PLAN:   Chronic total occlusion of artery of the extremities History of remote left to right femorofemoral crossover grafting by Dr. Orson [name] in 1994 for an occluded right external iliac artery. This ultimately became infected and was removed by Dr. Edilia [name] with patch angioplasty performed. This has been followed by Isaiah [name] in the past. The patient denies claudication.  Occlusion and stenosis of carotid artery without mention of cerebral infarction History of carotid artery disease with stroke in the past. This is followed by Isaiah [name] who last saw the patient  08/05/13. He has had CT angiography that has shown 80% right internal carotid artery stenosis which was not seen on duplex ultrasound. He is neurologically asymptomatic.  Coronary artery disease History of CAD dating back to 1994 when he had a myocardial infarction treated with streptokinase and subsequent balloon angioplasty. He had follow-up LAD stenting March 1996 for restenosis and subsequently developed in-stent restenosis requiring multivessel CABG in 1997. He has preserved EF. He has not had chronic catheterization since that time. His last Myoview performed from 08/02/11 was nonischemic. He denies chest pain or shortness of breath  Obstructive sleep apnea The patient currently wears C Pap. Continue current treatment  Hyperlipidemia Patient has a history of hyperlipidemia on pravastatin 40 mg a day. We will recheck a lipid and liver profile and maintain current medications  Chronic atrial fibrillation History of chronic A. Fib rate controlled on Coumadin anticoagulation. [facility] [place].  Essential hypertension History of hypertension with blood pressure measured today at 138/66. He is on clonidine, enalapril, hydralazine,  metoprolol and spironolactone. Continue current meds at current dosing      Isaiah GessJonathan J. Shermaine Brigham MD [facility] IncFACP,FACC,FAHA, [facility] LLCFSCAI 04/27/2014 8:21 AM

## 2014-04-27 NOTE — Assessment & Plan Note (Signed)
History of carotid artery disease with stroke in the past. This is followed by Dr. Cari Carawayhris Dickson who last saw the patient 08/05/13. He has had CT angiography that has shown 80% right internal carotid artery stenosis which was not seen on duplex ultrasound. He is neurologically asymptomatic.

## 2014-04-27 NOTE — Assessment & Plan Note (Signed)
History of CAD dating back to 1994 when he had a myocardial infarction treated with streptokinase and subsequent balloon angioplasty. He had follow-up LAD stenting March 1996 for restenosis and subsequently developed in-stent restenosis requiring multivessel CABG in 1997. He has preserved EF. He has not had chronic catheterization since that time. His last Myoview performed from 08/02/11 was nonischemic. He denies chest pain or shortness of breath

## 2014-04-27 NOTE — Assessment & Plan Note (Signed)
History of hypertension with blood pressure measured today at 138/66. He is on clonidine, enalapril, hydralazine, metoprolol and spironolactone. Continue current meds at current dosing

## 2014-04-27 NOTE — Assessment & Plan Note (Signed)
History of chronic A. Fib rate controlled on Coumadin anticoagulation. [facility] [place].

## 2014-04-27 NOTE — Assessment & Plan Note (Signed)
The patient currently wears C Pap. Continue current treatment

## 2014-04-27 NOTE — Patient Instructions (Addendum)
Dr. Berry has ordered for you to have lab work done today.  Your physician wants you to follow-up in 1 year with Dr. Berry. You will receive a reminder letter in the mail 2 months in advance. If you do not receive a letter, please call our office to schedule the follow-up appointment.  

## 2014-04-27 NOTE — Assessment & Plan Note (Addendum)
History of remote left to right femorofemoral crossover grafting by Dr. Orson SlickBowman in 1994 for an occluded right external iliac artery. This ultimately became infected and was removed by Dr. Edilia Boickson with patch angioplasty performed. This has been followed by Dr. Alanda AmassWeintraub in the past. The patient denies claudication.

## 2014-04-29 ENCOUNTER — Other Ambulatory Visit: Payer: Self-pay | Admitting: Cardiovascular Disease

## 2014-04-29 ENCOUNTER — Telehealth: Payer: Self-pay | Admitting: Cardiovascular Disease

## 2014-04-29 MED ORDER — METOLAZONE 2.5 MG PO TABS: 2.5000 mg | ORAL_TABLET | ORAL | Status: DC | PRN

## 2014-04-29 NOTE — Telephone Encounter (Signed)
Spoke with Dr. Sarah SwazilandJordan (PCP). She states she received a message from the patient that Dr. Allyson SabalBerry told him it was OK to stop warfarin and go on injectable (lovenox) anticoagulant in anticipation of dental work (extractions). She wanted clarification from Dr. Allyson SabalBerry on this. Informed her I could not locate this information in the most recent note from 12/15 and and not sure if dental work needs lovenox bridging.   Will defer to Dr. Allyson SabalBerry, Belenda CruiseKristin, [place] to review and advise Dr. SwazilandJordan (as she will be the one Rx'ing lovenox if this is needed)

## 2014-04-29 NOTE — Telephone Encounter (Signed)
Pt need his prescriptions called in for his Isosorbide,Allopurinol and Metolozone please. Please call to [facility]-[phone].

## 2014-04-29 NOTE — Telephone Encounter (Signed)
[place], please let Dr. SwazilandJordan know that is okay for lovenox bridge if pt needs to hold warfarin.  (CHADS2 score 4 including previous stroke)  Belenda CruiseKristin

## 2014-04-29 NOTE — Telephone Encounter (Signed)
Metolazone refilled.  Allopurinol refill request deferred to Dr. Berry & U[place]lyson Sab[place], RN to advise (or defer to PCP?)

## 2014-04-30 ENCOUNTER — Other Ambulatory Visit: Payer: Self-pay | Admitting: Cardiovascular Disease

## 2014-05-03 NOTE — Telephone Encounter (Signed)
Called Dr. S. SwazilandJordan and notified her.

## 2014-05-03 NOTE — Telephone Encounter (Signed)
E sent prescription. Future refills need to be refilled by PCP.

## 2014-05-24 DIAGNOSIS — Z7901 Long term (current) use of anticoagulants: Secondary | ICD-10-CM | POA: Diagnosis not present

## 2014-06-09 DIAGNOSIS — I5020 Unspecified systolic (congestive) heart failure: Secondary | ICD-10-CM | POA: Diagnosis not present

## 2014-06-14 DIAGNOSIS — I5020 Unspecified systolic (congestive) heart failure: Secondary | ICD-10-CM | POA: Diagnosis not present

## 2014-06-16 DIAGNOSIS — I5020 Unspecified systolic (congestive) heart failure: Secondary | ICD-10-CM | POA: Diagnosis not present

## 2014-06-18 DIAGNOSIS — I5020 Unspecified systolic (congestive) heart failure: Secondary | ICD-10-CM | POA: Diagnosis not present

## 2014-06-21 DIAGNOSIS — I5020 Unspecified systolic (congestive) heart failure: Secondary | ICD-10-CM | POA: Diagnosis not present

## 2014-07-05 DIAGNOSIS — I5020 Unspecified systolic (congestive) heart failure: Secondary | ICD-10-CM | POA: Diagnosis not present

## 2014-07-19 DIAGNOSIS — I5020 Unspecified systolic (congestive) heart failure: Secondary | ICD-10-CM | POA: Diagnosis not present

## 2014-08-10 ENCOUNTER — Encounter: Payer: Self-pay | Admitting: Family

## 2014-08-11 ENCOUNTER — Other Ambulatory Visit (HOSPITAL_COMMUNITY): Payer: Medicare Other

## 2014-08-11 ENCOUNTER — Ambulatory Visit: Payer: Medicare Other | Admitting: Family

## 2014-08-18 DIAGNOSIS — I5020 Unspecified systolic (congestive) heart failure: Secondary | ICD-10-CM | POA: Diagnosis not present

## 2014-09-06 DIAGNOSIS — I5020 Unspecified systolic (congestive) heart failure: Secondary | ICD-10-CM | POA: Diagnosis not present

## 2014-09-30 ENCOUNTER — Encounter: Payer: Self-pay | Admitting: Family

## 2014-10-01 ENCOUNTER — Encounter: Payer: Self-pay | Admitting: Family

## 2014-10-01 ENCOUNTER — Ambulatory Visit (INDEPENDENT_AMBULATORY_CARE_PROVIDER_SITE_OTHER): Payer: Medicare Other | Admitting: Family

## 2014-10-01 ENCOUNTER — Ambulatory Visit (HOSPITAL_COMMUNITY)
Admission: RE | Admit: 2014-10-01 | Discharge: 2014-10-01 | Disposition: A | Discharge status: Home / Self Care | Payer: Medicare Other | Source: Ambulatory Visit | Attending: Vascular Surgery | Admitting: Vascular Surgery

## 2014-10-01 VITALS — BP 123/61 | HR 56 | Resp 14 | Ht 67.0 in | Wt 251.0 lb

## 2014-10-01 DIAGNOSIS — I70219 Atherosclerosis of native arteries of extremities with intermittent claudication, unspecified extremity: Secondary | ICD-10-CM | POA: Diagnosis not present

## 2014-10-01 DIAGNOSIS — I6523 Occlusion and stenosis of bilateral carotid arteries: Secondary | ICD-10-CM | POA: Insufficient documentation

## 2014-10-01 DIAGNOSIS — E669 Obesity, unspecified: Secondary | ICD-10-CM | POA: Diagnosis not present

## 2014-10-01 DIAGNOSIS — E1159 Type 2 diabetes mellitus with other circulatory complications: Secondary | ICD-10-CM

## 2014-10-01 DIAGNOSIS — I739 Peripheral vascular disease, unspecified: Secondary | ICD-10-CM

## 2014-10-01 DIAGNOSIS — M25569 Pain in unspecified knee: Secondary | ICD-10-CM | POA: Insufficient documentation

## 2014-10-01 DIAGNOSIS — E1151 Type 2 diabetes mellitus with diabetic peripheral angiopathy without gangrene: Secondary | ICD-10-CM

## 2014-10-01 DIAGNOSIS — Z48812 Encounter for surgical aftercare following surgery on the circulatory system: Secondary | ICD-10-CM | POA: Insufficient documentation

## 2014-10-01 NOTE — Progress Notes (Signed)
Established Carotid Patient   History of Present Illness  Isaiah [name] is a 74 y.o. male patient that [name] last saw in March 2015. At that time he had 40-59% right carotid stenosis with no significant stenosis on the left. There had been no significant change compared to a study in September 2014. He had a previous CT angiogram which suggested a greater than 80% right carotid stenosis however we did not find this on duplex in September 2014.  In May 2014 he had an episode of transient loss of vision in both eyes. He had been having problems with low blood pressure and dizziness at that time also.  Patient has not had previous carotid artery intervention.  The patient denies any history of TIA or stroke symptoms, specifically the patient denies a history of amaurosis fugax or monocular blindness, denies a history unilateral  of facial drooping, denies a history of hemiplegia, and denies a history of receptive or expressive aphasia.    He has a history of fem-fem bypass graft in 1980, infected graft removed about 2012 by [name]. His legs feel like "they are going to give out" after walking about 200 feet, this has worsened per daughter.  The patient denies New Medical or Surgical History. He has been hospitalized in the recent past for pneumonia and exacerbation of CHF. He has a hx of cellulitis in both lower legs, more than once.  He uses CPAP for OSA, is having a hard time sleeping; pt advised to speak with his PCP re this.  Pt Diabetic: yes, he was taking U-500, very insulin resistant, but could no longer afford, is now taking 100 units 70/30 bid and 80 units at HS. He last saw his endocrinologist, [name], about 6 months ago. Pt smoker: former smoker, quit in 1980  Pt meds include: Statin : yes ASA: yes Other anticoagulants/antiplatelets: coumadin, has a history of atrial fib, daughter states he is in and out of atrial fib, states he started coumadin for possible  DVT.   Past Medical History  Diagnosis Date  . Diabetes mellitus   . Hypertension   . Hyperlipidemia   . CAD (coronary artery disease)   . Atrial fibrillation   . CHF (congestive heart failure)   . Stroke   . Peripheral vascular disease   . Myocardial infarction   . Left-sided carotid artery disease   . Venous insufficiency     Social History History  Substance Use Topics  . Smoking status: Former Smoker    Types: Cigarettes    Quit date: 05/14/1978  . Smokeless tobacco: Not on file  . Alcohol Use: No    Family History Family History  Problem Relation Age of Onset  . Heart disease Mother   . Other Mother     varicose veins  . Cancer Father   . Diabetes Father   . Heart disease Father     before age 23  . Hyperlipidemia Father   . Hypertension Father   . Other Father     varicose veins  . Diabetes Sister   . Heart disease Sister   . Other Sister     varicose veins  . Other Brother     varicose veins  . Diabetes Daughter   . Hyperlipidemia Daughter   . Hypertension Daughter   . Other Daughter     varicose veins    Surgical History Past Surgical History  Procedure Laterality Date  . Pr vein bypass graft,aorto-fem-pop  1980    FEM-FEM BPG by Dr. Orson SlickBowman  . Inguinal hernia repair  1980's    [place]  [facility]  . Coronary artery bypass graft  1997    vein harvest from right leg    No Known Allergies  Current Outpatient Prescriptions  Medication Sig Dispense Refill  . allopurinol (ZYLOPRIM) 100 MG tablet TAKE 2 TABLETS BY MOUTH DAILY. 30 tablet 0  . aspirin 81 MG tablet Take 81 mg by mouth daily.      . cloNIDine (CATAPRES) 0.1 MG tablet Take 1 tablet by mouth daily.    . enalapril (VASOTEC) 20 MG tablet Take 10 mg by mouth daily.     . furosemide (LASIX) 40 MG tablet Take 80 mg by mouth 2 (two) times daily.     Marland Kitchen. gabapentin (NEURONTIN) 100 MG capsule Take 1 capsule by mouth 3 (three) times daily.     Marland Kitchen. glipiZIDE (GLUCOTROL XL) 10 MG 24  hr tablet Take 10 mg by mouth 2 (two) times daily.      . hydrALAZINE (APRESOLINE) 50 MG tablet Take 1 tablet by mouth 2 (two) times daily.    . insulin regular (HUMULIN R,NOVOLIN R) 100 units/mL injection Inject 10 Units into the skin 3 (three) times daily before meals. 120 units twice daily and 60 units at bedtime.    . isosorbide mononitrate (IMDUR) 60 MG 24 hr tablet Take 1 tablet (60 mg total) by mouth daily. 30 tablet 11  . methylcellulose oral powder Take by mouth daily.      . metolazone (ZAROXOLYN) 2.5 MG tablet Take 1 tablet (2.5 mg total) by mouth as needed. 30 tablet 2  . metoprolol (LOPRESSOR) 50 MG tablet Take 50 mg by mouth. Take 2 1/2 tablets daily ( 125mg )     . Multiple Vitamin (MULTIVITAMIN) tablet Take 1 tablet by mouth daily.      . pravastatin (PRAVACHOL) 40 MG tablet Take 40 mg by mouth daily.    Marland Kitchen. spironolactone (ALDACTONE) 25 MG tablet Take 25 mg by mouth daily.    Marland Kitchen. warfarin (COUMADIN) 5 MG tablet Take 5 mg by mouth as directed.       No current facility-administered medications for this visit.    Review of Systems : See HPI for pertinent positives and negatives.  Physical Examination  Filed Vitals:   10/01/14 1158 10/01/14 1202  BP: 125/60 123/61  Pulse: 57 56  Resp:  14  Height:  5\' 7"  (1.702 m)  Weight:  251 lb (113.853 kg)  SpO2:  95%   Body mass index is 39.3 kg/(m^2).   General: WDWN obese male in NAD, large neck. GAIT: slow Eyes: PERRLA Pulmonary:  Non-labored, decreased air movement in left base, Negative  Rales, Negative rhonchi, & Negative wheezing.  Cardiac: regular Rhythm, no detected murmur, heart sounds are distant.  VASCULAR EXAM Carotid Bruits Right Left   Positive, soft Negative    Aorta is not palpable. Radial pulses are 2+ palpable and equal.  LE Pulses Right Left       FEMORAL  not palpable (obese)  not  palpable (obese)        POPLITEAL  not palpable   not palpable       POSTERIOR TIBIAL  not palpable   not palpable        DORSALIS PEDIS      ANTERIOR TIBIAL not palpable  2+ palpable        PERONEAL not Palpable   not Palpable     Gastrointestinal: soft, nontender, BS WNL, no r/g,  no palpated masses, obese.  Musculoskeletal: Negative muscle atrophy/wasting. M/S 4/5 throughout, Extremities without ischemic changes except moderate erythema in both lower legs that pt states has been present since he had cellulitis, was treated per pt. Trace bilateral pitting edema in both lower legs.  Neurologic: A&O X 3; Appropriate Affect, Speech is loud, CN 2-12 intact except is hard of hearing, Pain and light touch intact in extremities, Motor exam as listed above.   Non-Invasive Vascular Imaging CAROTID DUPLEX 10/01/2014   CEREBROVASCULAR DUPLEX EVALUATION    INDICATION: Carotid artery disease    PREVIOUS INTERVENTION(S): None    DUPLEX EXAM: Carotid duplex    RIGHT  LEFT  Peak Systolic Velocities (cm/s) End Diastolic Velocities (cm/s) Plaque LOCATION Peak Systolic Velocities (cm/s) End Diastolic Velocities (cm/s) Plaque  90 11 - CCA PROXIMAL 172 16 -  77 12 - CCA MID 153 17 HT  75 9 HT CCA DISTAL 145 21 HT  337 24 HT ECA 238 28 -  324 63 CP ICA PROXIMAL 159 26 CP  119 17 - ICA MID 131 20 -  104 15 - ICA DISTAL 69 13 -    4.2 ICA / CCA Ratio (PSV) 1.0  Antegrade Vertebral Flow Antegrade  120 Brachial Systolic Pressure (mmHg) 120  Triphasic Brachial Artery Waveforms Triphasic    Plaque Morphology:  HM = Homogeneous, HT = Heterogeneous, CP = Calcific Plaque, SP = Smooth Plaque, IP = Irregular Plaque  ADDITIONAL FINDINGS:     IMPRESSION: 1. 50 - 69% right internal carotid artery stenosis, calcific plaque may obscure higher velocity 2. 1 - 49% left internal carotid artery stenosis, calcific plaque may obscure higher velocity. 3. Right external carotid artery stenosis greater  than 50%    Compared to the previous exam:  No significant change      Assessment: JEFFREE CAZEAU is a 74 y.o. male who presents with asymptomatic 50 - 69% right internal carotid artery stenosis and 1 - 49% left internal carotid artery stenosis Today's carotid Duplex suggests 50 - 69% right internal carotid artery stenosis and 1 - 49% left internal carotid artery stenosis. No significant change from 08/05/2013 Duplex. He is on coumadin with a history of recurrent atrial fib and possible history of DVT; pt states his lower legs have remained somewhat red since he was treated for cellulitis, but have improved significantly with treatment.  He has generalized legs weakness after walking about 200 feet, resolved with rest.  He has a history of fem-fem bypass graft in 1980, infected graft removed about 2012 by [name]. He has no ischemic changes in his feet or legs. He has a history of CHF and OSA, his walking is also limited by dyspnea.  He is obese.  He has extreme insulin resistance requiring large amounts of insulin (280 units daily of mixed insulin) for some measure of glycemic control, he is under the care of an endocrinologist.  Face to  face time with patient was 25 minutes. Over 50% of this time was spent on counseling and coordination of care.    Plan: Follow-up in 6 months with Carotid Duplex and ABI's.   Pt advised to follow up with his PCP re insomnia and to work closely with his endocrinologist re DM management.    I discussed in depth with the patient the nature of atherosclerosis, and emphasized the importance of maximal medical management including strict control of blood pressure, blood glucose, and lipid levels, obtaining regular exercise, and continued cessation of smoking.  The patient is aware that without maximal medical management the underlying atherosclerotic disease process will progress, limiting the benefit of any interventions. The patient was given information  about stroke prevention and what symptoms should prompt the patient to seek immediate medical care. Thank you for allowing us to participate in this patient's care.  Charisse MarchSuzanne Aleksia Freiman, RN, MSN, FNP-C [facility] Office: [phone]  Clinic Physician: Edilia BoDickson  10/01/2014 11:57 AM

## 2014-10-01 NOTE — Patient Instructions (Addendum)

## 2014-11-08 ENCOUNTER — Other Ambulatory Visit: Payer: Self-pay

## 2014-12-30 ENCOUNTER — Encounter: Payer: Self-pay | Admitting: Cardiovascular Disease

## 2015-04-08 ENCOUNTER — Encounter (HOSPITAL_COMMUNITY): Payer: Medicare Other

## 2015-04-08 ENCOUNTER — Ambulatory Visit: Payer: Medicare Other | Admitting: Family

## 2015-04-29 ENCOUNTER — Encounter: Payer: Self-pay | Admitting: Family

## 2015-05-02 ENCOUNTER — Other Ambulatory Visit: Payer: Self-pay | Admitting: Cardiovascular Disease

## 2015-05-06 ENCOUNTER — Ambulatory Visit (INDEPENDENT_AMBULATORY_CARE_PROVIDER_SITE_OTHER): Payer: Medicare Other | Admitting: Family

## 2015-05-06 ENCOUNTER — Ambulatory Visit (HOSPITAL_COMMUNITY)
Admission: RE | Admit: 2015-05-06 | Discharge: 2015-05-06 | Disposition: A | Discharge status: Home / Self Care | Payer: Medicare Other | Source: Ambulatory Visit | Attending: Family | Admitting: Family

## 2015-05-06 ENCOUNTER — Encounter: Payer: Self-pay | Admitting: Family

## 2015-05-06 ENCOUNTER — Ambulatory Visit (INDEPENDENT_AMBULATORY_CARE_PROVIDER_SITE_OTHER)
Admission: RE | Admit: 2015-05-06 | Discharge: 2015-05-06 | Disposition: A | Discharge status: Home / Self Care | Payer: Medicare Other | Source: Ambulatory Visit | Attending: Family | Admitting: Family

## 2015-05-06 VITALS — BP 132/70 | HR 61 | Temp 97.9°F | Resp 16 | Ht 67.0 in | Wt 248.0 lb

## 2015-05-06 DIAGNOSIS — I10 Essential (primary) hypertension: Secondary | ICD-10-CM | POA: Diagnosis not present

## 2015-05-06 DIAGNOSIS — E785 Hyperlipidemia, unspecified: Secondary | ICD-10-CM | POA: Diagnosis not present

## 2015-05-06 DIAGNOSIS — I70219 Atherosclerosis of native arteries of extremities with intermittent claudication, unspecified extremity: Secondary | ICD-10-CM

## 2015-05-06 DIAGNOSIS — I872 Venous insufficiency (chronic) (peripheral): Secondary | ICD-10-CM

## 2015-05-06 DIAGNOSIS — I6523 Occlusion and stenosis of bilateral carotid arteries: Secondary | ICD-10-CM | POA: Diagnosis not present

## 2015-05-06 DIAGNOSIS — E119 Type 2 diabetes mellitus without complications: Secondary | ICD-10-CM | POA: Insufficient documentation

## 2015-05-06 DIAGNOSIS — E1151 Type 2 diabetes mellitus with diabetic peripheral angiopathy without gangrene: Secondary | ICD-10-CM

## 2015-05-06 DIAGNOSIS — E669 Obesity, unspecified: Secondary | ICD-10-CM | POA: Diagnosis not present

## 2015-05-06 DIAGNOSIS — I8312 Varicose veins of left lower extremity with inflammation: Secondary | ICD-10-CM

## 2015-05-06 DIAGNOSIS — I8311 Varicose veins of right lower extremity with inflammation: Secondary | ICD-10-CM

## 2015-05-06 NOTE — Progress Notes (Signed)
Chief Complaint: Extracranial Carotid Artery Stenosis   History of Present Illness  Isaiah [name] is a 74 y.o. male patient that [name] last saw in March 2015. At that time he had 40-59% right carotid stenosis with no significant stenosis on the left. There had been no significant change compared to a study in September 2014. He had a previous CT angiogram which suggested a greater than 80% right carotid stenosis however we did not find this on duplex in September 2014.  In May 2014 he had an episode of transient loss of vision in both eyes. He had been having problems with low blood pressure and dizziness at that time also.  Patient has not had previous carotid artery intervention.  The patient denies any history of TIA or stroke symptoms, specifically the patient denies a history of amaurosis fugax or monocular blindness, denies a history unilateral of facial drooping, denies a history of hemiplegia, and denies a history of receptive or expressive aphasia.   He has a history of fem-fem bypass graft in 1980, infected graft removed about 2012 by [name]. His legs feel like "they are going to give out" after walking about 200 feet,; his breathing also limits his walking.  He had the flu early in December 2016, did not need to be hospitalized.  He has been hospitalized in the recent past for pneumonia and exacerbation of CHF. He has a hx of cellulitis in both lower legs, more than once.  He uses CPAP for OSA, is having a hard time sleeping; pt advised to speak with his PCP re this.  Pt Diabetic: yes, he was taking U-500, very insulin resistant, but could no longer afford, is now taking 100 units 70/30 bid and 80 units at HS. Hhis endocrinologist is [name] Pt smoker: former smoker, quit in 1980  Pt meds include: Statin : yes ASA: yes Other anticoagulants/antiplatelets: coumadin, has a history of atrial fib,  he is in and out of atrial fib, states he started  coumadin for possible DVT.    Past Medical History  Diagnosis Date  . Diabetes mellitus   . Hypertension   . Hyperlipidemia   . CAD (coronary artery disease)   . Atrial fibrillation   . CHF (congestive heart failure)   . Stroke   . Peripheral vascular disease   . Myocardial infarction (HCC)   . Left-sided carotid artery disease   . Venous insufficiency   . DVT (deep venous thrombosis)     Social History Social History  Substance Use Topics  . Smoking status: Former Smoker    Types: Cigarettes    Quit date: 05/14/1978  . Smokeless tobacco: Never Used  . Alcohol Use: No    Family History Family History  Problem Relation Age of Onset  . Other Mother     varicose veins  . Heart disease Mother   . Hyperlipidemia Mother   . Hypertension Mother   . Varicose Veins Mother   . Cancer Father   . Diabetes Father   . Heart disease Father     before age 74  . Hyperlipidemia Father   . Hypertension Father   . Other Father     varicose veins  . Heart attack Father   . Diabetes Sister   . Heart disease Sister     DVT  . Other Sister     varicose veins  . Hyperlipidemia Sister   . Hypertension Sister   . Varicose Veins Sister   .  Other Brother     varicose veins  . Hyperlipidemia Brother   . Hypertension Brother   . Heart attack Brother   . Diabetes Daughter   . Hyperlipidemia Daughter   . Hypertension Daughter   . Other Daughter     varicose veins  . Varicose Veins Daughter   . Hypertension Son     Surgical History Past Surgical History  Procedure Laterality Date  . Pr vein bypass graft,aorto-fem-pop  1980    FEM-FEM BPG by Dr. Orson SlickBowman  . Inguinal hernia repair  1980's    [facility]  . Coronary artery bypass graft  1997    vein harvest from right leg    No Known Allergies  Current Outpatient Prescriptions  Medication Sig Dispense Refill  . allopurinol (ZYLOPRIM) 100 MG tablet TAKE 2 TABLETS BY MOUTH DAILY. 30 tablet 0  . aspirin  81 MG tablet Take 81 mg by mouth daily.      . cloNIDine (CATAPRES) 0.1 MG tablet Take 1 tablet by mouth 2 (two) times daily.     . enalapril (VASOTEC) 20 MG tablet Take 10 mg by mouth daily.     . furosemide (LASIX) 40 MG tablet Take 40 mg by mouth 2 (two) times daily.     Marland Kitchen. gabapentin (NEURONTIN) 100 MG capsule Take 1 capsule by mouth 3 (three) times daily.     Marland Kitchen. glipiZIDE (GLUCOTROL XL) 10 MG 24 hr tablet Take 5 mg by mouth 2 (two) times daily.     . hydrALAZINE (APRESOLINE) 50 MG tablet Take 2 mg by mouth 2 (two) times daily.     . insulin regular (HUMULIN R,NOVOLIN R) 100 units/mL injection Inject 10 Units into the skin 3 (three) times daily before meals. 70 units twice daily and 30 units at bedtime.    . isosorbide mononitrate (IMDUR) 60 MG 24 hr tablet Take 1 tablet (60 mg total) by mouth daily. Appointment needed for future refills 30 tablet 0  . methylcellulose oral powder Take by mouth daily.      . metolazone (ZAROXOLYN) 2.5 MG tablet Take 1 tablet (2.5 mg total) by mouth as needed. 30 tablet 2  . metoprolol (LOPRESSOR) 50 MG tablet Take 50 mg by mouth 2 (two) times daily. Take 2 1/2 tablets daily ( 125mg )    . Misc Natural Products (FIBER 7 PO) Take by mouth daily.    . Multiple Vitamin (MULTIVITAMIN) tablet Take 1 tablet by mouth daily.      . pantoprazole (PROTONIX) 40 MG tablet Take 40 mg by mouth 2 (two) times daily.    . pravastatin (PRAVACHOL) 40 MG tablet Take 40 mg by mouth daily.    Marland Kitchen. spironolactone (ALDACTONE) 25 MG tablet Take 12.5 mg by mouth daily.     Marland Kitchen. warfarin (COUMADIN) 5 MG tablet Take 5 mg by mouth daily.      No current facility-administered medications for this visit.    Review of Systems : See HPI for pertinent positives and negatives.  Physical Examination  Filed Vitals:   05/06/15 1519 05/06/15 1523  BP: 138/58 132/70  Pulse: 61 61  Temp:  97.9 F (36.6 C)  TempSrc:  Oral  Resp:  16  Height:  5\' 7"  (1.702 m)  Weight:  248 lb (112.492 kg)  SpO2:   95%   Body mass index is 38.83 kg/(m^2).   General: WDWN obese male in NAD, large neck. GAIT: slow Eyes: PERRLA Pulmonary: Non-labored, decreased air movement in  right base, + rales in right base, no rhonchi or wheezing.  Cardiac: regular rhythm, no detected murmur, heart sounds are distant.  VASCULAR EXAM Carotid Bruits Right Left   Positive, soft Negative   Aorta is not palpable. Radial pulses are 2+ palpable and equal.      LE Pulses Right Left   FEMORAL not palpable (obese) not palpable (obese)    POPLITEAL not palpable  not palpable   POSTERIOR TIBIAL not palpable  not palpable    DORSALIS PEDIS  ANTERIOR TIBIAL not palpable  2+ palpable    PERONEAL not Palpable   not Palpable     Gastrointestinal: soft, nontender, BS WNL, no r/g, venrtal and umbilical hernias, both reducible, obese.  Musculoskeletal: No muscle atrophy/wasting. M/S 4/5 throughout, Extremities without ischemic changes except moderate erythema in both lower legs that pt states has been present since he had cellulitis, was treated per pt. Trace bilateral pitting edema in both lower legs.  Neurologic: A&O X 3; Appropriate Affect, Speech is loud, pt is talkative, CN 2-12 intact except is hard of hearing, Pain and light touch intact in extremities, Motor exam as listed above.            Non-Invasive Vascular Imaging CAROTID DUPLEX 05/06/2015   CEREBROVASCULAR DUPLEX EVALUATION    INDICATION: Carotid artery disease    PREVIOUS INTERVENTION(S): None    DUPLEX EXAM: Carotid duplex    RIGHT  LEFT  Peak Systolic Velocities (cm/s) End Diastolic Velocities (cm/s) Plaque LOCATION Peak Systolic Velocities (cm/s) End Diastolic Velocities (cm/s) Plaque  110 10 - CCA PROXIMAL 124 15 -  92 15 HT CCA MID 140 16 HT   148 12 HT CCA DISTAL 142 12 HT  357 44 CP ECA 170 22 -  284 75 CP ICA PROXIMAL 205 34 CP  117 15 - ICA MID 200 31 -  48 12 - ICA DISTAL 103 18 -    3.0 ICA / CCA Ratio (PSV) 1.4  Antegrade Vertebral Flow Antegrade  148 Brachial Systolic Pressure (mmHg) 147  Triphasic Brachial Artery Waveforms Triphasic    Plaque Morphology:  HM = Homogeneous, HT = Heterogeneous, CP = Calcific Plaque, SP = Smooth Plaque, IP = Irregular Plaque     ADDITIONAL FINDINGS: Right subclavian artery 220 cm/s, left subclavian artery 190 cm/s. Subclavian arteries not well visualized    IMPRESSION: 1. Technically difficult exam due to depth of arteries, and difficulty with positioning 2. 60 - 79% right internal carotid artery stenosis, calcific plaque may obscure higher velocity. 3. Less than 40% left internal carotid artery stenosis, calcific plaque may obscure higher velocity    Compared to the previous exam:  Slight increase of velocity in the right internal carotid artery    ABI (Date: 05/06/2015)  R: [place] (0.58, 01/09/12), DP: monophasic, PT: monophasic, TBI: 0.49  L: [place] (1.0), DP: triphasic, PT: triphasic, TBI: not obtained due to movement    Assessment: Isaiah [name] is a 74 y.o. male who has no hx of stroke or TIA. He has a history of fem-fem bypass graft in 1980, infected graft removed about 2012 by [name].   Today's carotid duplex was a technically difficult exam due to depth of arteries, and difficulty with positioning 60 - 79% right internal carotid artery stenosis, calcific plaque may obscure higher velocity. Less than 40% left internal carotid artery stenosis, calcific plaque may obscure higher velocity. Slight increase of velocity in the right internal carotid artery.   Today's ABI's: both lower  legs vessels are non compressible. Right ankle waveforms are monophasic, left are triphasic.  He is on coumadin with a history of recurrent atrial fib and possible history of DVT; pt states  his lower legs have remained somewhat red since he was treated for cellulitis, but have improved significantly with treatment.  He has mild/moderate chronic venous stasis changes in both lower legs. He has generalized legs weakness after walking about 200 feet, resolved with rest.   He has no ischemic changes in his feet or legs. He has a history of CHF and OSA, his walking is also limited by dyspnea.  He is obese.  He has extreme insulin resistance requiring large amounts of insulin (280 units daily of mixed insulin) for some measure of glycemic control, he is under the care of an endocrinologist. Fortunately he has not smoked since 1980.  Face to face time with patient was 25 minutes. Over 50% of this time was spent on counseling and coordination of care.   Plan:  Graduated walking program discussed in detail.  Pt and his son in law present advised that pt should elevate his feet when not walking and not leave them dependent.  Follow-up in 6 months with Carotid Duplex scan and ABI's.   I discussed in depth with the patient the nature of atherosclerosis, and emphasized the importance of maximal medical management including strict control of blood pressure, blood glucose, and lipid levels, obtaining regular exercise, and continuedcessation of smoking.  The patient is aware that without maximal medical management the underlying atherosclerotic disease process will progress, limiting the benefit of any interventions. The patient was given information about stroke prevention and what symptoms should prompt the patient to seek immediate medical care. Thank you for allowing [place] to participate in this patient's care.  Charisse March, RN, MSN, FNP-C [facility] Office: [phone]  Clinic Physician: Early on call  05/06/2015 3:11 PM

## 2015-05-06 NOTE — Patient Instructions (Addendum)
Stroke Prevention Some medical conditions and behaviors are associated with an increased chance of having a stroke. You may prevent a stroke by making healthy choices and managing medical conditions. HOW CAN I REDUCE MY RISK OF HAVING A STROKE?   Stay physically active. Get at least 30 minutes of activity on most or all days.  Do not smoke. It may also be helpful to avoid exposure to secondhand smoke.  Limit alcohol use. Moderate alcohol use is considered to be:  No more than 2 drinks per day for men.  No more than 1 drink per day for nonpregnant women.  Eat healthy foods. This involves:  Eating 5 or more servings of fruits and vegetables a day.  Making dietary changes that address high blood pressure (hypertension), high cholesterol, diabetes, or obesity.  Manage your cholesterol levels.  Making food choices that are high in fiber and low in saturated fat, trans fat, and cholesterol may control cholesterol levels.  Take any prescribed medicines to control cholesterol as directed by your health care provider.  Manage your diabetes.  Controlling your carbohydrate and sugar intake is recommended to manage diabetes.  Take any prescribed medicines to control diabetes as directed by your health care provider.  Control your hypertension.  Making food choices that are low in salt (sodium), saturated fat, trans fat, and cholesterol is recommended to manage hypertension.  Ask your health care provider if you need treatment to lower your blood pressure. Take any prescribed medicines to control hypertension as directed by your health care provider.  If you are 18-39 years of age, have your blood pressure checked every 3-5 years. If you are 40 years of age or older, have your blood pressure checked every year.  Maintain a healthy weight.  Reducing calorie intake and making food choices that are low in sodium, saturated fat, trans fat, and cholesterol are recommended to manage  weight.  Stop drug abuse.  Avoid taking birth control pills.  Talk to your health care provider about the risks of taking birth control pills if you are over 35 years old, smoke, get migraines, or have ever had a blood clot.  Get evaluated for sleep disorders (sleep apnea).  Talk to your health care provider about getting a sleep evaluation if you snore a lot or have excessive sleepiness.  Take medicines only as directed by your health care provider.  For some people, aspirin or blood thinners (anticoagulants) are helpful in reducing the risk of forming abnormal blood clots that can lead to stroke. If you have the irregular heart rhythm of atrial fibrillation, you should be on a blood thinner unless there is a good reason you cannot take them.  Understand all your medicine instructions.  Make sure that other conditions (such as anemia or atherosclerosis) are addressed. SEEK IMMEDIATE MEDICAL CARE IF:   You have sudden weakness or numbness of the face, arm, or leg, especially on one side of the body.  Your face or eyelid droops to one side.  You have sudden confusion.  You have trouble speaking (aphasia) or understanding.  You have sudden trouble seeing in one or both eyes.  You have sudden trouble walking.  You have dizziness.  You have a loss of balance or coordination.  You have a sudden, severe headache with no known cause.  You have new chest pain or an irregular heartbeat. Any of these symptoms may represent a serious problem that is an emergency. Do not wait to see if the symptoms will   go away. Get medical help at once. Call your local emergency services (911 in [place] Do not drive yourself to the hospital.   This information is not intended to replace advice given to you by your health care provider. Make sure you discuss any questions you have with your health care provider.   Document Released: 06/07/2004 Document Revised: 05/21/2014 Document Reviewed:  10/31/2012 Elsevier Interactive Patient Education 2016 Elsevier Inc.    Peripheral Vascular Disease Peripheral vascular disease (PVD) is a disease of the blood vessels that are not part of your heart and brain. A simple term for PVD is poor circulation. In most cases, PVD narrows the blood vessels that carry blood from your heart to the rest of your body. This can result in a decreased supply of blood to your arms, legs, and internal organs, like your stomach or kidneys. However, it most often affects a person's lower legs and feet. There are two types of PVD.  Organic PVD. This is the more common type. It is caused by damage to the structure of blood vessels.  Functional PVD. This is caused by conditions that make blood vessels contract and tighten (spasm). Without treatment, PVD tends to get worse over time. PVD can also lead to acute ischemic limb. This is when an arm or limb suddenly has trouble getting enough blood. This is a medical emergency. CAUSES Each type of PVD has many different causes. The most common cause of PVD is buildup of a fatty material (plaque) inside of your arteries (atherosclerosis). Small amounts of plaque can break off from the walls of the blood vessels and become lodged in a smaller artery. This blocks blood flow and can cause acute ischemic limb. Other common causes of PVD include:  Blood clots that form inside of blood vessels.  Injuries to blood vessels.  Diseases that cause inflammation of blood vessels or cause blood vessel spasms.  Health behaviors and health history that increase your risk of developing PVD. RISK FACTORS  You may have a greater risk of PVD if you:  Have a family history of PVD.  Have certain medical conditions, including:  High cholesterol.  Diabetes.  High blood pressure (hypertension).  Coronary heart disease.  Past problems with blood clots.  Past injury, such as burns or a broken bone. These may have damaged blood  vessels in your limbs.  Buerger disease. This is caused by inflamed blood vessels in your hands and feet.  Some forms of arthritis.  Rare birth defects that affect the arteries in your legs.  Use tobacco.  Do not get enough exercise.  Are obese.  Are age 50 or older. SIGNS AND SYMPTOMS  PVD may cause many different symptoms. Your symptoms depend on what part of your body is not getting enough blood. Some common signs and symptoms include:  Cramps in your lower legs. This may be a symptom of poor leg circulation (claudication).  Pain and weakness in your legs while you are physically active that goes away when you rest (intermittent claudication).  Leg pain when at rest.  Leg numbness, tingling, or weakness.  Coldness in a leg or foot, especially when compared with the other leg.  Skin or hair changes. These can include:  Hair loss.  Shiny skin.  Pale or bluish skin.  Thick toenails.  Inability to get or maintain an erection (erectile dysfunction). People with PVD are more prone to developing ulcers and sores on their toes, feet, or legs. These may take longer than   normal to heal. DIAGNOSIS Your health care provider may diagnose PVD from your signs and symptoms. The health care provider will also do a physical exam. You may have tests to find out what is causing your PVD and determine its severity. Tests may include:  Blood pressure recordings from your arms and legs and measurements of the strength of your pulses (pulse volume recordings).  Imaging studies using sound waves to take pictures of the blood flow through your blood vessels (Doppler ultrasound).  Injecting a dye into your blood vessels before having imaging studies using:  X-rays (angiogram or arteriogram).  Computer-generated X-rays (CT angiogram).  A powerful electromagnetic field and a computer (magnetic resonance angiogram or MRA). TREATMENT Treatment for PVD depends on the cause of your condition  and the severity of your symptoms. It also depends on your age. Underlying causes need to be treated and controlled. These include long-lasting (chronic) conditions, such as diabetes, high cholesterol, and high blood pressure. You may need to first try making lifestyle changes and taking medicines. Surgery may be needed if these do not work. Lifestyle changes may include:  Quitting smoking.  Exercising regularly.  Following a low-fat, low-cholesterol diet. Medicines may include:  Blood thinners to prevent blood clots.  Medicines to improve blood flow.  Medicines to improve your blood cholesterol levels. Surgical procedures may include:  A procedure that uses an inflated balloon to open a blocked artery and improve blood flow (angioplasty).  A procedure to put in a tube (stent) to keep a blocked artery open (stent implant).  Surgery to reroute blood flow around a blocked artery (peripheral bypass surgery).  Surgery to remove dead tissue from an infected wound on the affected limb.  Amputation. This is surgical removal of the affected limb. This may be necessary in cases of acute ischemic limb that are not improved through medical or surgical treatments. HOME CARE INSTRUCTIONS  Take medicines only as directed by your health care provider.  Do not use any tobacco products, including cigarettes, chewing tobacco, or electronic cigarettes. If you need help quitting, ask your health care provider.  Lose weight if you are overweight, and maintain a healthy weight as directed by your health care provider.  Eat a diet that is low in fat and cholesterol. If you need help, ask your health care provider.  Exercise regularly. Ask your health care provider to suggest some good activities for you.  Use compression stockings or other mechanical devices as directed by your health care provider.  Take good care of your feet.  Wear comfortable shoes that fit well.  Check your feet often for  any cuts or sores. SEEK MEDICAL CARE IF:  You have cramps in your legs while walking.  You have leg pain when you are at rest.  You have coldness in a leg or foot.  Your skin changes.  You have erectile dysfunction.  You have cuts or sores on your feet that are not healing. SEEK IMMEDIATE MEDICAL CARE IF:  Your arm or leg turns cold and blue.  Your arms or legs become red, warm, swollen, painful, or numb.  You have chest pain or trouble breathing.  You suddenly have weakness in your face, arm, or leg.  You become very confused or lose the ability to speak.  You suddenly have a very bad headache or lose your vision.   This information is not intended to replace advice given to you by your health care provider. Make sure you discuss any questions   you have with your health care provider.   Document Released: 06/07/2004 Document Revised: 05/21/2014 Document Reviewed: 10/08/2013 Elsevier Interactive Patient Education 2016 Elsevier Inc.  

## 2015-05-31 ENCOUNTER — Other Ambulatory Visit: Payer: Self-pay | Admitting: Cardiovascular Disease

## 2015-07-28 DIAGNOSIS — I5020 Unspecified systolic (congestive) heart failure: Secondary | ICD-10-CM | POA: Diagnosis not present

## 2015-07-28 DIAGNOSIS — Z7901 Long term (current) use of anticoagulants: Secondary | ICD-10-CM | POA: Diagnosis not present

## 2015-08-08 DIAGNOSIS — S0031XA Abrasion of nose, initial encounter: Secondary | ICD-10-CM | POA: Diagnosis not present

## 2015-08-08 DIAGNOSIS — S4992XA Unspecified injury of left shoulder and upper arm, initial encounter: Secondary | ICD-10-CM | POA: Diagnosis not present

## 2015-08-08 DIAGNOSIS — S4991XA Unspecified injury of right shoulder and upper arm, initial encounter: Secondary | ICD-10-CM | POA: Diagnosis not present

## 2015-08-08 DIAGNOSIS — M25511 Pain in right shoulder: Secondary | ICD-10-CM | POA: Diagnosis not present

## 2015-08-08 DIAGNOSIS — T148 Other injury of unspecified body region: Secondary | ICD-10-CM | POA: Diagnosis not present

## 2015-08-08 DIAGNOSIS — M25521 Pain in right elbow: Secondary | ICD-10-CM | POA: Diagnosis not present

## 2015-08-08 DIAGNOSIS — M79622 Pain in left upper arm: Secondary | ICD-10-CM | POA: Diagnosis not present

## 2015-08-08 DIAGNOSIS — S59901A Unspecified injury of right elbow, initial encounter: Secondary | ICD-10-CM | POA: Diagnosis not present

## 2015-08-08 DIAGNOSIS — M79603 Pain in arm, unspecified: Secondary | ICD-10-CM | POA: Diagnosis not present

## 2015-08-08 DIAGNOSIS — S0990XA Unspecified injury of head, initial encounter: Secondary | ICD-10-CM | POA: Diagnosis not present

## 2015-08-08 DIAGNOSIS — S0081XA Abrasion of other part of head, initial encounter: Secondary | ICD-10-CM | POA: Diagnosis not present

## 2015-08-08 DIAGNOSIS — Z7901 Long term (current) use of anticoagulants: Secondary | ICD-10-CM | POA: Diagnosis not present

## 2015-08-09 ENCOUNTER — Encounter: Payer: Self-pay | Admitting: Cardiovascular Disease

## 2015-08-16 ENCOUNTER — Ambulatory Visit: Payer: Medicare Other | Admitting: Cardiovascular Disease

## 2015-08-23 ENCOUNTER — Telehealth: Payer: Self-pay | Admitting: Cardiovascular Disease

## 2015-08-24 ENCOUNTER — Ambulatory Visit: Payer: Medicare Other | Admitting: Cardiovascular Disease

## 2015-08-24 DIAGNOSIS — I5020 Unspecified systolic (congestive) heart failure: Secondary | ICD-10-CM | POA: Diagnosis not present

## 2015-08-24 DIAGNOSIS — Z7901 Long term (current) use of anticoagulants: Secondary | ICD-10-CM | POA: Diagnosis not present

## 2015-08-26 NOTE — Telephone Encounter (Signed)
Close encounter 

## 2015-09-06 ENCOUNTER — Ambulatory Visit (INDEPENDENT_AMBULATORY_CARE_PROVIDER_SITE_OTHER): Payer: Medicare Other | Admitting: Cardiovascular Disease

## 2015-09-06 ENCOUNTER — Encounter: Payer: Self-pay | Admitting: Cardiovascular Disease

## 2015-09-06 VITALS — BP 108/50 | HR 67 | Ht 67.0 in | Wt 243.0 lb

## 2015-09-06 DIAGNOSIS — E785 Hyperlipidemia, unspecified: Secondary | ICD-10-CM

## 2015-09-06 DIAGNOSIS — I5020 Unspecified systolic (congestive) heart failure: Secondary | ICD-10-CM | POA: Diagnosis not present

## 2015-09-06 DIAGNOSIS — I2510 Atherosclerotic heart disease of native coronary artery without angina pectoris: Secondary | ICD-10-CM | POA: Diagnosis not present

## 2015-09-06 DIAGNOSIS — I10 Essential (primary) hypertension: Secondary | ICD-10-CM | POA: Diagnosis not present

## 2015-09-06 DIAGNOSIS — Z7901 Long term (current) use of anticoagulants: Secondary | ICD-10-CM | POA: Diagnosis not present

## 2015-09-06 DIAGNOSIS — I482 Chronic atrial fibrillation, unspecified: Secondary | ICD-10-CM

## 2015-09-06 DIAGNOSIS — I2583 Coronary atherosclerosis due to lipid rich plaque: Principal | ICD-10-CM

## 2015-09-06 DIAGNOSIS — I7092 Chronic total occlusion of artery of the extremities: Secondary | ICD-10-CM

## 2015-09-06 NOTE — Assessment & Plan Note (Signed)
History of carotid artery disease followed by Dr. Adele Danickson's office.

## 2015-09-06 NOTE — Patient Instructions (Signed)

## 2015-09-06 NOTE — Assessment & Plan Note (Signed)
History of hyperlipidemia on pravastatin followed by his PCP 

## 2015-09-06 NOTE — Assessment & Plan Note (Signed)
History of hypertension blood pressure measured at 108/50. He is on clonidine, enalapril, and hydralazine as well as metoprolol. Continue current meds at current dosing

## 2015-09-06 NOTE — Assessment & Plan Note (Signed)
History of coronary artery disease status post anterior wall myocardial infarction back in 1994 treated with IV streptokinase and angioplasty. Several years later he had stenting of his LAD which really developed "in-stent restenosis requiring multivessel coronary artery bypass grafting in 1997 He has not required catheterization since. He denies chest pain or shortness of breath.

## 2015-09-06 NOTE — Assessment & Plan Note (Signed)
History of peripheral arterial disease status post known occluded right external iliac artery and subsequent left to right femorofemoral bypass grafting by Dr. Marcy PanningBill [name]. Ultimately this became infected and underwent removal of the graft and patch and plasty by Dr. Edilia Boickson who follows his lower extremity Dopplers in his office.

## 2015-09-06 NOTE — Assessment & Plan Note (Signed)
History of atrial fibrillation in the past currently in sinus rhythm with first degree AV block. He does have nonspecific ST and T-wave changes and is on Coumadin anticoagulation.

## 2015-09-06 NOTE — Progress Notes (Signed)
09/06/2015 Isaiah [name]   1940/12/13  [id number]  Primary Physician Isaiah [name]., MD Primary Cardiologist: Isaiah Gess MD Isaiah [name]   HPI:  Isaiah [name] is a 75-year-old moderately overweight divorced Caucasian male father of 5 children, grandfather of over 20 grandchildren who is accompanied by his daughter Isaiah [name] today.Marland Kitchen He was formerly a patient of Dr. Kandis [name]. i last saw him in the office 04/27/14.Marland Kitchen His primary healthcare provider is Isaiah [name] at the [facility] in [place]. He has a long complicated past medical history. His risk factors include remote tobacco abuse having quit 20 years ago, diabetes, hypertension, hyperlipidemia. He had an anterior wall myocardial infarction back in 1994 treated with IV streptokinase, and angioplasty. Several years later he had stenting of his LAD which ultimately developed "in-stent restenosis requiring multivessel coronary artery bypass grafting in 1997. He has not required angiography since. His last Myoview performed using was nonischemic. His other problems include peripheral vascular disease with a known occluded right external iliac artery, remote history of left to right femorofemoral crossover grafting by Dr. Marcy [name]. This ultimately became infected and was removed by Dr. Cari [name] with patch angioplasty. He denies claudication. He has had a stroke in the past and has a right internal carotid artery stenosis by CT angiography which has been followed by 2 puffs ultrasound as well. His other problems include chronic atrial fibrillation on Coumadin anticoagulation, obstructive sleep apnea on C Pap, and venous insufficiency.  Since I saw him a little over a year ago. He has done well from a heart point of view. He has not been hospitalized for any cardiovascular reason and denies chest pain or shortness of breath.  Current Outpatient Prescriptions  Medication Sig Dispense Refill  . allopurinol (ZYLOPRIM) 100 MG tablet  TAKE 2 TABLETS BY MOUTH DAILY. 30 tablet 0  . aspirin 81 MG tablet Take 81 mg by mouth daily.      . cloNIDine (CATAPRES) 0.1 MG tablet Take 1 tablet by mouth 2 (two) times daily.     . enalapril (VASOTEC) 20 MG tablet Take 10 mg by mouth daily.     . furosemide (LASIX) 40 MG tablet Take 40 mg by mouth 3 (three) times daily.     Marland Kitchen gabapentin (NEURONTIN) 100 MG capsule Take 1 capsule by mouth 3 (three) times daily.     Marland Kitchen glipiZIDE (GLUCOTROL XL) 10 MG 24 hr tablet Take 5 mg by mouth 2 (two) times daily.     . hydrALAZINE (APRESOLINE) 50 MG tablet Take 50 mg by mouth 2 (two) times daily.     . insulin regular (HUMULIN R,NOVOLIN R) 100 units/mL injection Inject 10 Units into the skin 3 (three) times daily before meals. 70 units twice daily and 30 units at bedtime.    . isosorbide mononitrate (IMDUR) 60 MG 24 hr tablet Take 1 tablet (60 mg total) by mouth daily. Appointment needed for future refills 30 tablet 0  . methylcellulose oral powder Take by mouth daily.      . metolazone (ZAROXOLYN) 2.5 MG tablet Take 1 tablet (2.5 mg total) by mouth as needed. 30 tablet 2  . metoprolol (LOPRESSOR) 50 MG tablet Take 125 mg by mouth 2 (two) times daily. Take 2 1/2 tablets BID ( )    . Misc Natural Products (FIBER 7 PO) Take by mouth daily.    . Multiple Vitamin (MULTIVITAMIN) tablet Take 1 tablet by mouth daily.      . pantoprazole (PROTONIX) 40  MG tablet Take 40 mg by mouth 2 (two) times daily.    . pravastatin (PRAVACHOL) 40 MG tablet Take 40 mg by mouth daily.    Marland Kitchen. spironolactone (ALDACTONE) 25 MG tablet Take 12.5 mg by mouth daily.     . temazepam (RESTORIL) 15 MG capsule Take 15 mg by mouth at bedtime as needed.     . traMADol (ULTRAM) 50 MG tablet Take 50 mg by mouth 3 (three) times daily as needed.    . warfarin (COUMADIN) 5 MG tablet Take 5 mg by mouth daily. TAKE 4 MG Monday, Wednesday , AND Friday. TAKE 5 MG ALL OTHER DAYS.     No current facility-administered medications for this visit.     No Known Allergies  Social History   Social History  . Marital Status: Divorced    Spouse Name: N/A  . Number of Children: N/A  . Years of Education: N/A   Occupational History  . Not on file.   Social History Main Topics  . Smoking status: Former Smoker    Types: Cigarettes    Quit date: 05/14/1978  . Smokeless tobacco: Never Used  . Alcohol Use: No  . Drug Use: No  . Sexual Activity: Not on file   Other Topics Concern  . Not on file   Social History Narrative     Review of Systems: General: negative for chills, fever, night sweats or weight changes.  Cardiovascular: negative for chest pain, dyspnea on exertion, edema, orthopnea, palpitations, paroxysmal nocturnal dyspnea or shortness of breath Dermatological: negative for rash Respiratory: negative for cough or wheezing Urologic: negative for hematuria Abdominal: negative for nausea, vomiting, diarrhea, bright red blood per rectum, melena, or hematemesis Neurologic: negative for visual changes, syncope, or dizziness All other systems reviewed and are otherwise negative except as noted above.    Blood pressure 108/50, pulse 67, height 5\' 7"  (1.702 m), weight 243 lb (110.224 kg).  General appearance: alert and no distress Neck: no adenopathy, no carotid bruit, no JVD, supple, symmetrical, trachea midline and thyroid not enlarged, symmetric, no tenderness/mass/nodules Lungs: clear to auscultation bilaterally Heart: regular rate and rhythm, S1, S2 normal, no murmur, click, rub or gallop Extremities: extremities normal, atraumatic, no cyanosis or edema  EKG normal sinus rhythm at 67 with poor R-wave progression and first-degree AV block. I presently reviewed this EKG  ASSESSMENT AND PLAN:   Chronic total occlusion of artery of the extremities History of peripheral arterial disease status post known occluded right external iliac artery and subsequent left to right femorofemoral bypass grafting by Dr. Marcy PanningBill [name].  Ultimately this became infected and underwent removal of the graft and patch and plasty by Dr. Edilia Boickson who follows his lower extremity Dopplers in his office.  Occlusion and stenosis of carotid artery without mention of cerebral infarction History of carotid artery disease followed by Dr. Adele Danickson's office.  Coronary artery disease History of coronary artery disease status post anterior wall myocardial infarction back in 1994 treated with IV streptokinase and angioplasty. Several years later he had stenting of his LAD which really developed "in-stent restenosis requiring multivessel coronary artery bypass grafting in 1997 He has not required catheterization since. He denies chest pain or shortness of breath.  Chronic atrial fibrillation History of atrial fibrillation in the past currently in sinus rhythm with first degree AV block. He does have nonspecific ST and T-wave changes and is on Coumadin anticoagulation.  Essential hypertension History of hypertension blood pressure measured at 108/50. He is on clonidine, enalapril,  and hydralazine as well as metoprolol. Continue current meds at current dosing  Hyperlipidemia History of hyperlipidemia on pravastatin followed by his PCP      Isaiah Gess MD [facility], [facility] 09/06/2015 11:06 AM

## 2015-09-22 DIAGNOSIS — Z7901 Long term (current) use of anticoagulants: Secondary | ICD-10-CM | POA: Diagnosis not present

## 2015-09-22 DIAGNOSIS — I5020 Unspecified systolic (congestive) heart failure: Secondary | ICD-10-CM | POA: Diagnosis not present

## 2015-09-29 DIAGNOSIS — Z7901 Long term (current) use of anticoagulants: Secondary | ICD-10-CM | POA: Diagnosis not present

## 2015-09-29 DIAGNOSIS — I5020 Unspecified systolic (congestive) heart failure: Secondary | ICD-10-CM | POA: Diagnosis not present

## 2015-10-05 DIAGNOSIS — I10 Essential (primary) hypertension: Secondary | ICD-10-CM | POA: Diagnosis not present

## 2015-10-05 DIAGNOSIS — E119 Type 2 diabetes mellitus without complications: Secondary | ICD-10-CM | POA: Diagnosis not present

## 2015-10-05 DIAGNOSIS — E669 Obesity, unspecified: Secondary | ICD-10-CM | POA: Diagnosis not present

## 2015-10-05 DIAGNOSIS — F519 Sleep disorder not due to a substance or known physiological condition, unspecified: Secondary | ICD-10-CM | POA: Diagnosis not present

## 2015-10-05 DIAGNOSIS — I509 Heart failure, unspecified: Secondary | ICD-10-CM | POA: Diagnosis not present

## 2015-10-05 DIAGNOSIS — Z7901 Long term (current) use of anticoagulants: Secondary | ICD-10-CM | POA: Diagnosis not present

## 2015-10-13 DIAGNOSIS — W19XXXA Unspecified fall, initial encounter: Secondary | ICD-10-CM

## 2015-10-13 DIAGNOSIS — Z7901 Long term (current) use of anticoagulants: Secondary | ICD-10-CM | POA: Diagnosis not present

## 2015-10-13 DIAGNOSIS — Y92009 Unspecified place in unspecified non-institutional (private) residence as the place of occurrence of the external cause: Secondary | ICD-10-CM

## 2015-10-13 DIAGNOSIS — I5020 Unspecified systolic (congestive) heart failure: Secondary | ICD-10-CM | POA: Diagnosis not present

## 2015-10-13 HISTORY — DX: Unspecified fall, initial encounter: W19.XXXA

## 2015-10-13 HISTORY — DX: Unspecified place in unspecified non-institutional (private) residence as the place of occurrence of the external cause: Y92.009

## 2015-11-09 ENCOUNTER — Encounter (HOSPITAL_COMMUNITY): Payer: Medicare Other

## 2015-11-09 ENCOUNTER — Ambulatory Visit: Payer: Medicare Other | Admitting: Family

## 2015-11-30 DIAGNOSIS — I5020 Unspecified systolic (congestive) heart failure: Secondary | ICD-10-CM | POA: Diagnosis not present

## 2015-11-30 DIAGNOSIS — Z7901 Long term (current) use of anticoagulants: Secondary | ICD-10-CM | POA: Diagnosis not present

## 2015-12-09 ENCOUNTER — Encounter: Payer: Self-pay | Admitting: Family

## 2015-12-15 DIAGNOSIS — Z7901 Long term (current) use of anticoagulants: Secondary | ICD-10-CM | POA: Diagnosis not present

## 2015-12-15 DIAGNOSIS — I5020 Unspecified systolic (congestive) heart failure: Secondary | ICD-10-CM | POA: Diagnosis not present

## 2015-12-16 ENCOUNTER — Ambulatory Visit (INDEPENDENT_AMBULATORY_CARE_PROVIDER_SITE_OTHER): Payer: Medicare Other | Admitting: Family

## 2015-12-16 ENCOUNTER — Ambulatory Visit (HOSPITAL_COMMUNITY)
Admission: RE | Admit: 2015-12-16 | Discharge: 2015-12-16 | Disposition: A | Discharge status: Home / Self Care | Payer: Medicare Other | Source: Ambulatory Visit | Attending: Family | Admitting: Family

## 2015-12-16 ENCOUNTER — Ambulatory Visit (INDEPENDENT_AMBULATORY_CARE_PROVIDER_SITE_OTHER)
Admission: RE | Admit: 2015-12-16 | Discharge: 2015-12-16 | Disposition: A | Discharge status: Home / Self Care | Payer: Medicare Other | Source: Ambulatory Visit | Attending: Family | Admitting: Family

## 2015-12-16 ENCOUNTER — Encounter: Payer: Self-pay | Admitting: Family

## 2015-12-16 VITALS — BP 118/51 | HR 59 | Temp 97.0°F | Resp 18 | Ht 67.0 in | Wt 245.9 lb

## 2015-12-16 DIAGNOSIS — E1151 Type 2 diabetes mellitus with diabetic peripheral angiopathy without gangrene: Secondary | ICD-10-CM

## 2015-12-16 DIAGNOSIS — I8311 Varicose veins of right lower extremity with inflammation: Secondary | ICD-10-CM

## 2015-12-16 DIAGNOSIS — I6523 Occlusion and stenosis of bilateral carotid arteries: Secondary | ICD-10-CM

## 2015-12-16 DIAGNOSIS — I110 Hypertensive heart disease with heart failure: Secondary | ICD-10-CM | POA: Diagnosis not present

## 2015-12-16 DIAGNOSIS — E669 Obesity, unspecified: Secondary | ICD-10-CM

## 2015-12-16 DIAGNOSIS — I70213 Atherosclerosis of native arteries of extremities with intermittent claudication, bilateral legs: Secondary | ICD-10-CM | POA: Diagnosis not present

## 2015-12-16 DIAGNOSIS — I509 Heart failure, unspecified: Secondary | ICD-10-CM | POA: Diagnosis not present

## 2015-12-16 DIAGNOSIS — I70219 Atherosclerosis of native arteries of extremities with intermittent claudication, unspecified extremity: Secondary | ICD-10-CM

## 2015-12-16 DIAGNOSIS — I2510 Atherosclerotic heart disease of native coronary artery without angina pectoris: Secondary | ICD-10-CM | POA: Insufficient documentation

## 2015-12-16 DIAGNOSIS — I8312 Varicose veins of left lower extremity with inflammation: Secondary | ICD-10-CM

## 2015-12-16 DIAGNOSIS — I872 Venous insufficiency (chronic) (peripheral): Secondary | ICD-10-CM

## 2015-12-16 DIAGNOSIS — E785 Hyperlipidemia, unspecified: Secondary | ICD-10-CM | POA: Diagnosis not present

## 2015-12-16 DIAGNOSIS — I7092 Chronic total occlusion of artery of the extremities: Secondary | ICD-10-CM | POA: Diagnosis not present

## 2015-12-16 DIAGNOSIS — Z6838 Body mass index (BMI) 38.0-38.9, adult: Secondary | ICD-10-CM | POA: Insufficient documentation

## 2015-12-16 DIAGNOSIS — R0989 Other specified symptoms and signs involving the circulatory and respiratory systems: Secondary | ICD-10-CM | POA: Diagnosis present

## 2015-12-16 NOTE — Progress Notes (Signed)
[facility] [place] HISTORY AND PHYSICAL   MRN : [id number]  History of Present Illness:   Isaiah [name] is a 75 y.o. male patient that [name] last saw in March 2015. At that time he had 40-59% right carotid stenosis with no significant stenosis on the left. There had been no significant change compared to a study in September 2014. He had a previous CT angiogram which suggested a greater than 80% right carotid stenosis however we did not find this on duplex in September 2014.  In May 2014 he had an episode of transient loss of vision in both eyes. He had been having problems with low blood pressure and dizziness at that time also.  Patient has not had previous carotid artery intervention.  The patient denies any history of TIA or stroke symptoms, specifically the patient denies a history of amaurosis fugax or monocular blindness, denies a history unilateral of facial drooping, denies a history of hemiplegia, and denies a history of receptive or expressive aphasia.   He has a history of fem-fem bypass graft in 1980, infected graft removed about 2012 by [name]. His legs feel like "they are going to give out" after walking about 200 feet,; his breathing also limits his walking.  He had the flu early in December 2016, did not need to be hospitalized.  He has been hospitalized in the recent past for pneumonia and exacerbation of CHF. He has a hx of cellulitis in both lower legs, more than once.  He uses CPAP for OSA, is having a hard time sleeping; pt advised to speak with his PCP re this.  Pt Diabetic: yes, he was taking U-500, very insulin resistant, but could no longer afford, is now taking 100 units 70/30 bid and 80 units at HS. Hhis endocrinologist is [name] Pt smoker: former smoker, quit in 1980  Pt meds include: Statin : yes ASA: yes Other anticoagulants/antiplatelets: coumadin, has a history of atrial fib,  he is in and out of  atrial fib, states he started coumadin for possible DVT.    Current Outpatient Prescriptions  Medication Sig Dispense Refill  . allopurinol (ZYLOPRIM) 100 MG tablet TAKE 2 TABLETS BY MOUTH DAILY. 30 tablet 0  . aspirin 81 MG tablet Take 81 mg by mouth daily.      . cloNIDine (CATAPRES) 0.1 MG tablet Take 1 tablet by mouth 2 (two) times daily.     . enalapril (VASOTEC) 20 MG tablet Take 10 mg by mouth daily.     . furosemide (LASIX) 40 MG tablet Take 40 mg by mouth 3 (three) times daily.     Marland Kitchen gabapentin (NEURONTIN) 100 MG capsule Take 1 capsule by mouth 3 (three) times daily.     Marland Kitchen glipiZIDE (GLUCOTROL XL) 10 MG 24 hr tablet Take 5 mg by mouth 2 (two) times daily.     . hydrALAZINE (APRESOLINE) 50 MG tablet Take 50 mg by mouth 2 (two) times daily.     . insulin regular (HUMULIN R,NOVOLIN R) 100 units/mL injection Inject 10 Units into the skin 3 (three) times daily before meals. 70 units twice daily and 30 units at bedtime.    . isosorbide mononitrate (IMDUR) 60 MG 24 hr tablet Take 1 tablet (60 mg total) by mouth daily. Appointment needed for future refills 30 tablet 0  . methylcellulose oral powder Take by mouth daily.      . metolazone (ZAROXOLYN) 2.5 MG tablet Take 1 tablet (2.5 mg  total) by mouth as needed. 30 tablet 2  . metoprolol (LOPRESSOR) 50 MG tablet Take 125 mg by mouth 2 (two) times daily. Take 2 1/2 tablets BID ( 125mg )    . Misc Natural Products (FIBER 7 PO) Take by mouth daily.    . Multiple Vitamin (MULTIVITAMIN) tablet Take 1 tablet by mouth daily.      . pantoprazole (PROTONIX) 40 MG tablet Take 40 mg by mouth 2 (two) times daily.    . pravastatin (PRAVACHOL) 40 MG tablet Take 40 mg by mouth daily.    Marland Kitchen spironolactone (ALDACTONE) 25 MG tablet Take 12.5 mg by mouth daily.     . temazepam (RESTORIL) 15 MG capsule Take 15 mg by mouth at bedtime as needed.     . traMADol (ULTRAM) 50 MG tablet Take 50 mg by mouth 3 (three) times daily as needed.    . warfarin (COUMADIN) 5 MG  tablet Take 5 mg by mouth daily. TAKE 4 MG Monday, Wednesday , AND Friday. TAKE 5 MG ALL OTHER DAYS.     No current facility-administered medications for this visit.     Past Medical History:  Diagnosis Date  . Atrial fibrillation (HCC)   . CAD (coronary artery disease)   . CHF (congestive heart failure) (HCC)   . Diabetes mellitus   . DVT (deep venous thrombosis) (HCC)   . Fall at home 10/2015  . Hyperlipidemia   . Hypertension   . Left-sided carotid artery disease (HCC)   . Myocardial infarction (HCC)   . Peripheral vascular disease (HCC)   . Stroke (HCC)   . Venous insufficiency     Social History Social History  Substance Use Topics  . Smoking status: Former Smoker    Types: Cigarettes    Quit date: 05/14/1978  . Smokeless tobacco: Never Used  . Alcohol use No    Family History Family History  Problem Relation Age of Onset  . Other Mother     varicose veins  . Heart disease Mother   . Hyperlipidemia Mother   . Hypertension Mother   . Varicose Veins Mother   . Cancer Father   . Diabetes Father   . Heart disease Father     before age 23  . Hyperlipidemia Father   . Hypertension Father   . Other Father     varicose veins  . Heart attack Father   . Diabetes Daughter   . Hyperlipidemia Daughter   . Hypertension Daughter   . Other Daughter     varicose veins  . Varicose Veins Daughter   . Hypertension Son   . Diabetes Sister   . Heart disease Sister     DVT  . Other Sister     varicose veins  . Hyperlipidemia Sister   . Hypertension Sister   . Varicose Veins Sister   . Other Brother     varicose veins  . Hyperlipidemia Brother   . Hypertension Brother   . Heart attack Brother     Surgical History Past Surgical History:  Procedure Laterality Date  . CORONARY ARTERY BYPASS GRAFT  1997   vein harvest from right leg  . INGUINAL HERNIA REPAIR  1980's   Bilateral,  done in [facility]  . PR VEIN BYPASS GRAFT,AORTO-FEM-POP  1980   FEM-FEM BPG by  [name]    No Known Allergies  Current Outpatient Prescriptions  Medication Sig Dispense Refill  . allopurinol (ZYLOPRIM) 100 MG tablet TAKE 2 TABLETS BY MOUTH DAILY.  30 tablet 0  . aspirin 81 MG tablet Take 81 mg by mouth daily.      . cloNIDine (CATAPRES) 0.1 MG tablet Take 1 tablet by mouth 2 (two) times daily.     . enalapril (VASOTEC) 20 MG tablet Take 10 mg by mouth daily.     . furosemide (LASIX) 40 MG tablet Take 40 mg by mouth 3 (three) times daily.     Marland Kitchen gabapentin (NEURONTIN) 100 MG capsule Take 1 capsule by mouth 3 (three) times daily.     Marland Kitchen glipiZIDE (GLUCOTROL XL) 10 MG 24 hr tablet Take 5 mg by mouth 2 (two) times daily.     . hydrALAZINE (APRESOLINE) 50 MG tablet Take 50 mg by mouth 2 (two) times daily.     . insulin regular (HUMULIN R,NOVOLIN R) 100 units/mL injection Inject 10 Units into the skin 3 (three) times daily before meals. 70 units twice daily and 30 units at bedtime.    . isosorbide mononitrate (IMDUR) 60 MG 24 hr tablet Take 1 tablet (60 mg total) by mouth daily. Appointment needed for future refills 30 tablet 0  . methylcellulose oral powder Take by mouth daily.      . metolazone (ZAROXOLYN) 2.5 MG tablet Take 1 tablet (2.5 mg total) by mouth as needed. 30 tablet 2  . metoprolol (LOPRESSOR) 50 MG tablet Take 125 mg by mouth 2 (two) times daily. Take 2 1/2 tablets BID ( 125mg )    . Misc Natural Products (FIBER 7 PO) Take by mouth daily.    . Multiple Vitamin (MULTIVITAMIN) tablet Take 1 tablet by mouth daily.      . pantoprazole (PROTONIX) 40 MG tablet Take 40 mg by mouth 2 (two) times daily.    . pravastatin (PRAVACHOL) 40 MG tablet Take 40 mg by mouth daily.    Marland Kitchen spironolactone (ALDACTONE) 25 MG tablet Take 12.5 mg by mouth daily.     . temazepam (RESTORIL) 15 MG capsule Take 15 mg by mouth at bedtime as needed.     . traMADol (ULTRAM) 50 MG tablet Take 50 mg by mouth 3 (three) times daily as needed.    . warfarin (COUMADIN) 5 MG tablet Take 5 mg by mouth  daily. TAKE 4 MG Monday, Wednesday , AND Friday. TAKE 5 MG ALL OTHER DAYS.     No current facility-administered medications for this visit.      REVIEW OF SYSTEMS: See HPI for pertinent positives and negatives.  Physical Examination Vitals:   12/16/15 1224 12/16/15 1227  BP: (!) 117/53 (!) 118/51  Pulse: (!) 59   Resp: 18   Temp: 97 F (36.1 C)   TempSrc: Oral   SpO2: 97%   Weight: 245 lb 14.4 oz (111.5 kg)   Height: 5\' 7"  (1.702 m)    Body mass index is 38.51 kg/m.  General: WDWN obese male in NAD, large neck. GAIT: slow Eyes: PERRLA Pulmonary: Respirations are non-labored, distant sounds in all posterior fields, + rales in both bases, no rhonchi or wheezing.  Cardiac: regular rhythm, no detected murmur, heart sounds are distant.  VASCULAR EXAM Carotid Bruits Right Left   negative positive   Aorta is not palpable. Radial pulses are 2+ palpable and equal.      LE Pulses Right Left   FEMORAL 1+palpable  1+ palpable     POPLITEAL not palpable  not palpable   POSTERIOR TIBIAL not palpable  not palpable    DORSALIS PEDIS  ANTERIOR TIBIAL not palpable  2+ palpable    PERONEAL not Palpable   not Palpable     Gastrointestinal: soft, nontender, BS WNL, no r/g, venrtal and umbilical hernias, both reducible, obese.  Musculoskeletal: No muscle atrophy/wasting. M/S 4/5 throughout, Extremities without ischemic changes except mild erythema in both lower legs that pt states has been present since he had cellulitis, was treated per pt. Mild venous stasis dermatitis in both lower legs. Trace bilateral pitting edema in both lower legs.  Neurologic: A&O X 3; Appropriate Affect, Speech is loud, pt is talkative, CN 2-12 intact except is hard of hearing, Pain and light touch intact in  extremities, Motor exam as listed above.    Non-Invasive Vascular Imaging (12/16/15):  CEREBROVASCULAR DUPLEX EVALUATION    INDICATION: Carotid artery disease     PREVIOUS INTERVENTION(S):     DUPLEX EXAM:     RIGHT  LEFT  Peak Systolic Velocities (cm/s) End Diastolic Velocities (cm/s) Plaque LOCATION Peak Systolic Velocities (cm/s) End Diastolic Velocities (cm/s) Plaque  76 10  CCA PROXIMAL 172 20   82 14 HT CCA MID 130 15 HT  72 11 HT CCA DISTAL 132 14 HT  415 0 CP ECA 245 0   116 12 CP ICA PROXIMAL 146 22 CP  111 16  ICA MID 119 14   128 17  ICA DISTAL 75 13      ICA / CCA Ratio (PSV)   Antegrade  Vertebral Flow Antegrade    Brachial Systolic Pressure (mmHg)   Multiphasic (Subclavian artery) Brachial Artery Waveforms Multiphasic (Subclavian artery)    Plaque Morphology:  HM = Homogeneous, HT = Heterogeneous, CP = Calcific Plaque, SP = Smooth Plaque, IP = Irregular Plaque     ADDITIONAL FINDINGS: Calcific plaque extending from right internal carotid artery origin, 1.6 cm into the internal carotid artery with shadowing obscuring visualization of this segment.    IMPRESSION: Bilateral internal carotid artery velocities suggest a <40% stenosis. Velocities may be underestimated due to calcific shadowing as stated above.    Compared to the previous exam:  Unable to achieve velocity range of 60-79% stated on last exam.    ABI (Date: 12/16/2015)  R: [place] ([place]), DP: monophasic, PT: monophasic, TBI: [place]  L: [place] ([place]), DP: biphasic, PT: biphasic, TBI:     ASSESSMENT:  Isaiah [name] is a 75 y.o. male who has no hx of stroke or TIA. He has a history of fem-fem bypass graft in 1980, infected graft removed about 2012 by [name].   DATA: Today's carotid duplex suggests <40% bilateral ICA stenosis.Unable to achieve velocity range of 60-79% in right ICA stated on last exam.  ABI's and TBI's remain unreliable due to non compressible calcified vessels. Right LE waveforms remain  monophasic, left LE waveforms have degraded to biphasic from triphasic.  He is on coumadin with a history of recurrent atrial fib and possible history of DVT; pt states his lower legs have remained somewhat red since he was treated for cellulitis, but have improved significantly with treatment.  He has mild/moderate chronic venous stasis changes in both lower legs. He has generalized legs weakness after walking about 200 feet, resolved with rest.   He has no ischemic changes in his feet or legs. He has a history of CHF and OSA, his walking is also limited by dyspnea.  He is obese.  He has extreme insulin resistance requiring large amounts of insulin (280 units daily of mixed insulin) for some measure of glycemic control, he is under the  care of an endocrinologist. Fortunately he has not smoked since 1980.  Face to face time with patient was 25 minutes. Over 50% of this time was spent on counseling and coordination of care.   PLAN:   Graduated walking program discussed and how to achieve safely. Based on today's exam and non-invasive vascular lab results, the patient will follow up in 6 months with the following tests: ABI's and carotid duplex. I discussed in depth with the patient the nature of atherosclerosis, and emphasized the importance of maximal medical management including strict control of blood pressure, blood glucose, and lipid levels, obtaining regular exercise, and cessation of smoking.  The patient is aware that without maximal medical management the underlying atherosclerotic disease process will progress, limiting the benefit of any interventions.  The patient was given information about stroke prevention and what symptoms should prompt the patient to seek immediate medical care.  The patient was given information about PAD including signs, symptoms, treatment, what symptoms should prompt the patient to seek immediate medical care, and risk reduction measures to take. Thank  you for allowing [place] to participate in this patient's care.  Charisse March, RN, MSN, FNP-C [facility] Office: [phone]  Clinic MD: Randie Heinz 12/16/2015 12:32 PM

## 2015-12-16 NOTE — Patient Instructions (Signed)
Stroke Prevention Some medical conditions and behaviors are associated with an increased chance of having a stroke. You may prevent a stroke by making healthy choices and managing medical conditions. HOW CAN I REDUCE MY RISK OF HAVING A STROKE?   Stay physically active. Get at least 30 minutes of activity on most or all days.  Do not smoke. It may also be helpful to avoid exposure to secondhand smoke.  Limit alcohol use. Moderate alcohol use is considered to be:  No more than 2 drinks per day for men.  No more than 1 drink per day for nonpregnant women.  Eat healthy foods. This involves:  Eating 5 or more servings of fruits and vegetables a day.  Making dietary changes that address high blood pressure (hypertension), high cholesterol, diabetes, or obesity.  Manage your cholesterol levels.  Making food choices that are high in fiber and low in saturated fat, trans fat, and cholesterol may control cholesterol levels.  Take any prescribed medicines to control cholesterol as directed by your health care provider.  Manage your diabetes.  Controlling your carbohydrate and sugar intake is recommended to manage diabetes.  Take any prescribed medicines to control diabetes as directed by your health care provider.  Control your hypertension.  Making food choices that are low in salt (sodium), saturated fat, trans fat, and cholesterol is recommended to manage hypertension.  Ask your health care provider if you need treatment to lower your blood pressure. Take any prescribed medicines to control hypertension as directed by your health care provider.  If you are 18-39 years of age, have your blood pressure checked every 3-5 years. If you are 40 years of age or older, have your blood pressure checked every year.  Maintain a healthy weight.  Reducing calorie intake and making food choices that are low in sodium, saturated fat, trans fat, and cholesterol are recommended to manage  weight.  Stop drug abuse.  Avoid taking birth control pills.  Talk to your health care provider about the risks of taking birth control pills if you are over 35 years old, smoke, get migraines, or have ever had a blood clot.  Get evaluated for sleep disorders (sleep apnea).  Talk to your health care provider about getting a sleep evaluation if you snore a lot or have excessive sleepiness.  Take medicines only as directed by your health care provider.  For some people, aspirin or blood thinners (anticoagulants) are helpful in reducing the risk of forming abnormal blood clots that can lead to stroke. If you have the irregular heart rhythm of atrial fibrillation, you should be on a blood thinner unless there is a good reason you cannot take them.  Understand all your medicine instructions.  Make sure that other conditions (such as anemia or atherosclerosis) are addressed. SEEK IMMEDIATE MEDICAL CARE IF:   You have sudden weakness or numbness of the face, arm, or leg, especially on one side of the body.  Your face or eyelid droops to one side.  You have sudden confusion.  You have trouble speaking (aphasia) or understanding.  You have sudden trouble seeing in one or both eyes.  You have sudden trouble walking.  You have dizziness.  You have a loss of balance or coordination.  You have a sudden, severe headache with no known cause.  You have new chest pain or an irregular heartbeat. Any of these symptoms may represent a serious problem that is an emergency. Do not wait to see if the symptoms will   go away. Get medical help at once. Call your local emergency services (911 in [place] Do not drive yourself to the hospital.   This information is not intended to replace advice given to you by your health care provider. Make sure you discuss any questions you have with your health care provider.   Document Released: 06/07/2004 Document Revised: 05/21/2014 Document Reviewed:  10/31/2012 Elsevier Interactive Patient Education 2016 [employer]     Peripheral Vascular Disease Peripheral vascular disease (PVD) is a disease of the blood vessels that are not part of your heart and brain. A simple term for PVD is poor circulation. In most cases, PVD narrows the blood vessels that carry blood from your heart to the rest of your body. This can result in a decreased supply of blood to your arms, legs, and internal organs, like your stomach or kidneys. However, it most often affects a person's lower legs and feet. There are two types of PVD.  Organic PVD. This is the more common type. It is caused by damage to the structure of blood vessels.  Functional PVD. This is caused by conditions that make blood vessels contract and tighten (spasm). Without treatment, PVD tends to get worse over time. PVD can also lead to acute ischemic limb. This is when an arm or limb suddenly has trouble getting enough blood. This is a medical emergency. CAUSES Each type of PVD has many different causes. The most common cause of PVD is buildup of a fatty material (plaque) inside of your arteries (atherosclerosis). Small amounts of plaque can break off from the walls of the blood vessels and become lodged in a smaller artery. This blocks blood flow and can cause acute ischemic limb. Other common causes of PVD include:  Blood clots that form inside of blood vessels.  Injuries to blood vessels.  Diseases that cause inflammation of blood vessels or cause blood vessel spasms.  Health behaviors and health history that increase your risk of developing PVD. RISK FACTORS  You may have a greater risk of PVD if you:  Have a family history of PVD.  Have certain medical conditions, including:  High cholesterol.  Diabetes.  High blood pressure (hypertension).  Coronary heart disease.  Past problems with blood clots.  Past injury, such as burns or a broken bone. These may have damaged blood  vessels in your limbs.  Buerger disease. This is caused by inflamed blood vessels in your hands and feet.  Some forms of arthritis.  Rare birth defects that affect the arteries in your legs.  Use tobacco.  Do not get enough exercise.  Are obese.  Are age 50 or older. SIGNS AND SYMPTOMS  PVD may cause many different symptoms. Your symptoms depend on what part of your body is not getting enough blood. Some common signs and symptoms include:  Cramps in your lower legs. This may be a symptom of poor leg circulation (claudication).  Pain and weakness in your legs while you are physically active that goes away when you rest (intermittent claudication).  Leg pain when at rest.  Leg numbness, tingling, or weakness.  Coldness in a leg or foot, especially when compared with the other leg.  Skin or hair changes. These can include:  Hair loss.  Shiny skin.  Pale or bluish skin.  Thick toenails.  Inability to get or maintain an erection (erectile dysfunction). People with PVD are more prone to developing ulcers and sores on their toes, feet, or legs. These may take longer   than normal to heal. DIAGNOSIS Your health care provider may diagnose PVD from your signs and symptoms. The health care provider will also do a physical exam. You may have tests to find out what is causing your PVD and determine its severity. Tests may include:  Blood pressure recordings from your arms and legs and measurements of the strength of your pulses (pulse volume recordings).  Imaging studies using sound waves to take pictures of the blood flow through your blood vessels (Doppler ultrasound).  Injecting a dye into your blood vessels before having imaging studies using:  X-rays (angiogram or arteriogram).  Computer-generated X-rays (CT angiogram).  A powerful electromagnetic field and a computer (magnetic resonance angiogram or MRA). TREATMENT Treatment for PVD depends on the cause of your condition  and the severity of your symptoms. It also depends on your age. Underlying causes need to be treated and controlled. These include long-lasting (chronic) conditions, such as diabetes, high cholesterol, and high blood pressure. You may need to first try making lifestyle changes and taking medicines. Surgery may be needed if these do not work. Lifestyle changes may include:  Quitting smoking.  Exercising regularly.  Following a low-fat, low-cholesterol diet. Medicines may include:  Blood thinners to prevent blood clots.  Medicines to improve blood flow.  Medicines to improve your blood cholesterol levels. Surgical procedures may include:  A procedure that uses an inflated balloon to open a blocked artery and improve blood flow (angioplasty).  A procedure to put in a tube (stent) to keep a blocked artery open (stent implant).  Surgery to reroute blood flow around a blocked artery (peripheral bypass surgery).  Surgery to remove dead tissue from an infected wound on the affected limb.  Amputation. This is surgical removal of the affected limb. This may be necessary in cases of acute ischemic limb that are not improved through medical or surgical treatments. HOME CARE INSTRUCTIONS  Take medicines only as directed by your health care provider.  Do not use any tobacco products, including cigarettes, chewing tobacco, or electronic cigarettes. If you need help quitting, ask your health care provider.  Lose weight if you are overweight, and maintain a healthy weight as directed by your health care provider.  Eat a diet that is low in fat and cholesterol. If you need help, ask your health care provider.  Exercise regularly. Ask your health care provider to suggest some good activities for you.  Use compression stockings or other mechanical devices as directed by your health care provider.  Take good care of your feet.  Wear comfortable shoes that fit well.  Check your feet often for  any cuts or sores. SEEK MEDICAL CARE IF:  You have cramps in your legs while walking.  You have leg pain when you are at rest.  You have coldness in a leg or foot.  Your skin changes.  You have erectile dysfunction.  You have cuts or sores on your feet that are not healing. SEEK IMMEDIATE MEDICAL CARE IF:  Your arm or leg turns cold and blue.  Your arms or legs become red, warm, swollen, painful, or numb.  You have chest pain or trouble breathing.  You suddenly have weakness in your face, arm, or leg.  You become very confused or lose the ability to speak.  You suddenly have a very bad headache or lose your vision.   This information is not intended to replace advice given to you by your health care provider. Make sure you discuss any   questions you have with your health care provider.   Document Released: 06/07/2004 Document Revised: 05/21/2014 Document Reviewed: 10/08/2013 Elsevier Interactive Patient Education 2016 [employer]     Venous Stasis or Chronic Venous Insufficiency Chronic venous insufficiency, also called venous stasis, is a condition that affects the veins in the legs. The condition prevents blood from being pumped through these veins effectively. Blood may no longer be pumped effectively from the legs back to the heart. This condition can range from mild to severe. With proper treatment, you should be able to continue with an active life. CAUSES  Chronic venous insufficiency occurs when the vein walls become stretched, weakened, or damaged or when valves within the vein are damaged. Some common causes of this include:  High blood pressure inside the veins (venous hypertension).  Increased blood pressure in the leg veins from long periods of sitting or standing.  A blood clot that blocks blood flow in a vein (deep vein thrombosis).  Inflammation of a superficial vein (phlebitis) that causes a blood clot to form. RISK FACTORS Various things can make  you more likely to develop chronic venous insufficiency, including:  Family history of this condition.  Obesity.  Pregnancy.  Sedentary lifestyle.  Smoking.  Jobs requiring long periods of standing or sitting in one place.  Being a certain age. Women in their 40s and 50s and men in their 70s are more likely to develop this condition. SIGNS AND SYMPTOMS  Symptoms may include:   Varicose veins.  Skin breakdown or ulcers.  Reddened or discolored skin on the leg.  Brown, smooth, tight, and painful skin just above the ankle, usually on the inside surface (lipodermatosclerosis).  Swelling. DIAGNOSIS  To diagnose this condition, your health care provider will take a medical history and do a physical exam. The following tests may be ordered to confirm the diagnosis:  Duplex ultrasound--A procedure that produces a picture of a blood vessel and nearby organs and also provides information on blood flow through the blood vessel.  Plethysmography--A procedure that tests blood flow.  A venogram, or venography--A procedure used to look at the veins using X-ray and dye. TREATMENT The goals of treatment are to help you return to an active life and to minimize pain or disability. Treatment will depend on the severity of the condition. Medical procedures may be needed for severe cases. Treatment options may include:   Use of compression stockings. These can help with symptoms and lower the chances of the problem getting worse, but they do not cure the problem.  Sclerotherapy--A procedure involving an injection of a material that "dissolves" the damaged veins. Other veins in the network of blood vessels take over the function of the damaged veins.  Surgery to remove the vein or cut off blood flow through the vein (vein stripping or laser ablation surgery).  Surgery to repair a valve. HOME CARE INSTRUCTIONS   Wear compression stockings as directed by your health care provider.  Only take  over-the-counter or prescription medicines for pain, discomfort, or fever as directed by your health care provider.  Follow up with your health care provider as directed. SEEK MEDICAL CARE IF:   You have redness, swelling, or increasing pain in the affected area.  You see a red streak or line that extends up or down from the affected area.  You have a breakdown or loss of skin in the affected area, even if the breakdown is small.  You have an injury to the affected area. SEEK   IMMEDIATE MEDICAL CARE IF:   You have an injury and open wound in the affected area.  Your pain is severe and does not improve with medicine.  You have sudden numbness or weakness in the foot or ankle below the affected area, or you have trouble moving your foot or ankle.  You have a fever or persistent symptoms for more than 2-3 days.  You have a fever and your symptoms suddenly get worse. MAKE SURE YOU:   Understand these instructions.  Will watch your condition.  Will get help right away if you are not doing well or get worse.   This information is not intended to replace advice given to you by your health care provider. Make sure you discuss any questions you have with your health care provider.   Document Released: 09/03/2006 Document Revised: 02/18/2013 Document Reviewed: 01/05/2013 Elsevier Interactive Patient Education 2016 [employer]  

## 2015-12-28 DIAGNOSIS — Z7901 Long term (current) use of anticoagulants: Secondary | ICD-10-CM | POA: Diagnosis not present

## 2016-01-19 DIAGNOSIS — Z7901 Long term (current) use of anticoagulants: Secondary | ICD-10-CM | POA: Diagnosis not present

## 2016-01-19 DIAGNOSIS — I5020 Unspecified systolic (congestive) heart failure: Secondary | ICD-10-CM | POA: Diagnosis not present

## 2016-02-23 DIAGNOSIS — Z23 Encounter for immunization: Secondary | ICD-10-CM | POA: Diagnosis not present

## 2016-02-23 DIAGNOSIS — Z7901 Long term (current) use of anticoagulants: Secondary | ICD-10-CM | POA: Diagnosis not present

## 2016-02-23 DIAGNOSIS — I5020 Unspecified systolic (congestive) heart failure: Secondary | ICD-10-CM | POA: Diagnosis not present

## 2016-03-29 ENCOUNTER — Other Ambulatory Visit: Payer: Self-pay | Admitting: *Deleted

## 2016-03-29 DIAGNOSIS — I739 Peripheral vascular disease, unspecified: Secondary | ICD-10-CM

## 2016-03-29 DIAGNOSIS — I6523 Occlusion and stenosis of bilateral carotid arteries: Secondary | ICD-10-CM

## 2016-04-25 DIAGNOSIS — Z7901 Long term (current) use of anticoagulants: Secondary | ICD-10-CM | POA: Diagnosis not present

## 2016-04-25 DIAGNOSIS — I4891 Unspecified atrial fibrillation: Secondary | ICD-10-CM | POA: Diagnosis not present

## 2016-05-02 DIAGNOSIS — I4891 Unspecified atrial fibrillation: Secondary | ICD-10-CM | POA: Diagnosis not present

## 2016-05-02 DIAGNOSIS — Z7901 Long term (current) use of anticoagulants: Secondary | ICD-10-CM | POA: Diagnosis not present

## 2016-05-09 DIAGNOSIS — Z7901 Long term (current) use of anticoagulants: Secondary | ICD-10-CM | POA: Diagnosis not present

## 2016-05-09 DIAGNOSIS — I4891 Unspecified atrial fibrillation: Secondary | ICD-10-CM | POA: Diagnosis not present

## 2016-05-25 DIAGNOSIS — I4891 Unspecified atrial fibrillation: Secondary | ICD-10-CM | POA: Diagnosis not present

## 2016-05-25 DIAGNOSIS — Z7901 Long term (current) use of anticoagulants: Secondary | ICD-10-CM | POA: Diagnosis not present

## 2016-06-05 DIAGNOSIS — I4891 Unspecified atrial fibrillation: Secondary | ICD-10-CM | POA: Diagnosis not present

## 2016-06-05 DIAGNOSIS — E669 Obesity, unspecified: Secondary | ICD-10-CM | POA: Diagnosis not present

## 2016-06-05 DIAGNOSIS — I10 Essential (primary) hypertension: Secondary | ICD-10-CM | POA: Diagnosis not present

## 2016-06-05 DIAGNOSIS — Z7901 Long term (current) use of anticoagulants: Secondary | ICD-10-CM | POA: Diagnosis not present

## 2016-06-05 DIAGNOSIS — I5020 Unspecified systolic (congestive) heart failure: Secondary | ICD-10-CM | POA: Diagnosis not present

## 2016-06-05 DIAGNOSIS — E119 Type 2 diabetes mellitus without complications: Secondary | ICD-10-CM | POA: Diagnosis not present

## 2016-06-07 DIAGNOSIS — N179 Acute kidney failure, unspecified: Secondary | ICD-10-CM | POA: Diagnosis not present

## 2016-06-08 DIAGNOSIS — N179 Acute kidney failure, unspecified: Secondary | ICD-10-CM | POA: Diagnosis not present

## 2016-06-11 ENCOUNTER — Telehealth (HOSPITAL_COMMUNITY): Payer: Self-pay

## 2016-06-11 NOTE — Telephone Encounter (Signed)
Called to schedule f/u, left message for pt to return call. AW 

## 2016-06-14 DIAGNOSIS — N179 Acute kidney failure, unspecified: Secondary | ICD-10-CM | POA: Diagnosis not present

## 2016-06-14 DIAGNOSIS — I10 Essential (primary) hypertension: Secondary | ICD-10-CM | POA: Diagnosis not present

## 2016-06-21 DIAGNOSIS — N179 Acute kidney failure, unspecified: Secondary | ICD-10-CM | POA: Diagnosis not present

## 2016-06-21 DIAGNOSIS — I10 Essential (primary) hypertension: Secondary | ICD-10-CM | POA: Diagnosis not present

## 2016-06-21 DIAGNOSIS — N184 Chronic kidney disease, stage 4 (severe): Secondary | ICD-10-CM | POA: Diagnosis not present

## 2016-06-26 ENCOUNTER — Encounter: Payer: Self-pay | Admitting: Family

## 2016-07-04 ENCOUNTER — Encounter (HOSPITAL_COMMUNITY): Payer: Medicare Other

## 2016-07-04 ENCOUNTER — Ambulatory Visit: Payer: Medicare Other | Admitting: Family

## 2016-07-06 DIAGNOSIS — D631 Anemia in chronic kidney disease: Secondary | ICD-10-CM | POA: Diagnosis not present

## 2016-07-06 DIAGNOSIS — N184 Chronic kidney disease, stage 4 (severe): Secondary | ICD-10-CM | POA: Diagnosis not present

## 2016-07-06 DIAGNOSIS — Z6839 Body mass index (BMI) 39.0-39.9, adult: Secondary | ICD-10-CM | POA: Diagnosis not present

## 2016-07-06 DIAGNOSIS — N183 Chronic kidney disease, stage 3 (moderate): Secondary | ICD-10-CM | POA: Diagnosis not present

## 2016-07-06 DIAGNOSIS — N2581 Secondary hyperparathyroidism of renal origin: Secondary | ICD-10-CM | POA: Diagnosis not present

## 2016-07-19 DIAGNOSIS — I4891 Unspecified atrial fibrillation: Secondary | ICD-10-CM | POA: Diagnosis not present

## 2016-07-19 DIAGNOSIS — Z7901 Long term (current) use of anticoagulants: Secondary | ICD-10-CM | POA: Diagnosis not present

## 2016-07-27 DIAGNOSIS — Z7901 Long term (current) use of anticoagulants: Secondary | ICD-10-CM | POA: Diagnosis not present

## 2016-08-05 ENCOUNTER — Encounter (HOSPITAL_COMMUNITY): Payer: Self-pay | Admitting: *Deleted

## 2016-08-05 ENCOUNTER — Emergency Department (HOSPITAL_COMMUNITY): Payer: Medicare Other

## 2016-08-05 ENCOUNTER — Inpatient Hospital Stay (HOSPITAL_COMMUNITY)
Admission: EM | Admit: 2016-08-05 | Discharge: 2016-08-09 | DRG: 247 | Disposition: A | Discharge status: Home / Self Care | Payer: Medicare Other | Attending: Internal Medicine | Admitting: Internal Medicine

## 2016-08-05 DIAGNOSIS — R079 Chest pain, unspecified: Secondary | ICD-10-CM | POA: Insufficient documentation

## 2016-08-05 DIAGNOSIS — I214 Non-ST elevation (NSTEMI) myocardial infarction: Principal | ICD-10-CM | POA: Diagnosis present

## 2016-08-05 DIAGNOSIS — D638 Anemia in other chronic diseases classified elsewhere: Secondary | ICD-10-CM | POA: Diagnosis present

## 2016-08-05 DIAGNOSIS — I482 Chronic atrial fibrillation: Secondary | ICD-10-CM | POA: Diagnosis present

## 2016-08-05 DIAGNOSIS — E1159 Type 2 diabetes mellitus with other circulatory complications: Secondary | ICD-10-CM | POA: Diagnosis present

## 2016-08-05 DIAGNOSIS — Z8673 Personal history of transient ischemic attack (TIA), and cerebral infarction without residual deficits: Secondary | ICD-10-CM

## 2016-08-05 DIAGNOSIS — I739 Peripheral vascular disease, unspecified: Secondary | ICD-10-CM | POA: Diagnosis present

## 2016-08-05 DIAGNOSIS — I7092 Chronic total occlusion of artery of the extremities: Secondary | ICD-10-CM | POA: Diagnosis present

## 2016-08-05 DIAGNOSIS — Z6832 Body mass index (BMI) 32.0-32.9, adult: Secondary | ICD-10-CM

## 2016-08-05 DIAGNOSIS — E669 Obesity, unspecified: Secondary | ICD-10-CM | POA: Diagnosis present

## 2016-08-05 DIAGNOSIS — I130 Hypertensive heart and chronic kidney disease with heart failure and stage 1 through stage 4 chronic kidney disease, or unspecified chronic kidney disease: Secondary | ICD-10-CM | POA: Diagnosis present

## 2016-08-05 DIAGNOSIS — Z951 Presence of aortocoronary bypass graft: Secondary | ICD-10-CM

## 2016-08-05 DIAGNOSIS — I6529 Occlusion and stenosis of unspecified carotid artery: Secondary | ICD-10-CM | POA: Diagnosis present

## 2016-08-05 DIAGNOSIS — I2583 Coronary atherosclerosis due to lipid rich plaque: Secondary | ICD-10-CM

## 2016-08-05 DIAGNOSIS — I5032 Chronic diastolic (congestive) heart failure: Secondary | ICD-10-CM | POA: Diagnosis present

## 2016-08-05 DIAGNOSIS — D509 Iron deficiency anemia, unspecified: Secondary | ICD-10-CM | POA: Diagnosis present

## 2016-08-05 DIAGNOSIS — I25110 Atherosclerotic heart disease of native coronary artery with unstable angina pectoris: Secondary | ICD-10-CM | POA: Diagnosis present

## 2016-08-05 DIAGNOSIS — Z8249 Family history of ischemic heart disease and other diseases of the circulatory system: Secondary | ICD-10-CM

## 2016-08-05 DIAGNOSIS — Z79899 Other long term (current) drug therapy: Secondary | ICD-10-CM

## 2016-08-05 DIAGNOSIS — I441 Atrioventricular block, second degree: Secondary | ICD-10-CM | POA: Diagnosis present

## 2016-08-05 DIAGNOSIS — E785 Hyperlipidemia, unspecified: Secondary | ICD-10-CM | POA: Diagnosis present

## 2016-08-05 DIAGNOSIS — Z87891 Personal history of nicotine dependence: Secondary | ICD-10-CM

## 2016-08-05 DIAGNOSIS — G4733 Obstructive sleep apnea (adult) (pediatric): Secondary | ICD-10-CM | POA: Diagnosis present

## 2016-08-05 DIAGNOSIS — I493 Ventricular premature depolarization: Secondary | ICD-10-CM | POA: Diagnosis present

## 2016-08-05 DIAGNOSIS — R072 Precordial pain: Secondary | ICD-10-CM

## 2016-08-05 DIAGNOSIS — N183 Chronic kidney disease, stage 3 unspecified: Secondary | ICD-10-CM | POA: Diagnosis present

## 2016-08-05 DIAGNOSIS — D649 Anemia, unspecified: Secondary | ICD-10-CM | POA: Diagnosis present

## 2016-08-05 DIAGNOSIS — Z7901 Long term (current) use of anticoagulants: Secondary | ICD-10-CM

## 2016-08-05 DIAGNOSIS — I10 Essential (primary) hypertension: Secondary | ICD-10-CM | POA: Diagnosis present

## 2016-08-05 DIAGNOSIS — E1151 Type 2 diabetes mellitus with diabetic peripheral angiopathy without gangrene: Secondary | ICD-10-CM | POA: Diagnosis not present

## 2016-08-05 DIAGNOSIS — E1122 Type 2 diabetes mellitus with diabetic chronic kidney disease: Secondary | ICD-10-CM | POA: Diagnosis present

## 2016-08-05 DIAGNOSIS — I440 Atrioventricular block, first degree: Secondary | ICD-10-CM | POA: Diagnosis present

## 2016-08-05 DIAGNOSIS — I252 Old myocardial infarction: Secondary | ICD-10-CM

## 2016-08-05 DIAGNOSIS — Z7984 Long term (current) use of oral hypoglycemic drugs: Secondary | ICD-10-CM

## 2016-08-05 DIAGNOSIS — Z794 Long term (current) use of insulin: Secondary | ICD-10-CM

## 2016-08-05 DIAGNOSIS — Z7982 Long term (current) use of aspirin: Secondary | ICD-10-CM

## 2016-08-05 DIAGNOSIS — I872 Venous insufficiency (chronic) (peripheral): Secondary | ICD-10-CM | POA: Diagnosis present

## 2016-08-05 DIAGNOSIS — I2510 Atherosclerotic heart disease of native coronary artery without angina pectoris: Secondary | ICD-10-CM | POA: Diagnosis present

## 2016-08-05 DIAGNOSIS — I480 Paroxysmal atrial fibrillation: Secondary | ICD-10-CM | POA: Diagnosis present

## 2016-08-05 DIAGNOSIS — D631 Anemia in chronic kidney disease: Secondary | ICD-10-CM | POA: Diagnosis present

## 2016-08-05 LAB — CBC WITH DIFFERENTIAL/PLATELET
Basophils Absolute: 0 10*3/uL (ref 0.0–0.1)
Basophils Relative: 0 %
Eosinophils Absolute: 0.2 10*3/uL (ref 0.0–0.7)
Eosinophils Relative: 2 %
HCT: 31.9 % — ABNORMAL LOW (ref 39.0–52.0)
Hemoglobin: 9.6 g/dL — ABNORMAL LOW (ref 13.0–17.0)
Lymphocytes Relative: 24 %
Lymphs Abs: 2.5 10*3/uL (ref 0.7–4.0)
MCH: 28.4 pg (ref 26.0–34.0)
MCHC: 30.1 g/dL (ref 30.0–36.0)
MCV: 94.4 fL (ref 78.0–100.0)
Monocytes Absolute: 0.5 10*3/uL (ref 0.1–1.0)
Monocytes Relative: 5 %
Neutro Abs: 7.1 10*3/uL (ref 1.7–7.7)
Neutrophils Relative %: 69 %
Platelets: 259 10*3/uL (ref 150–400)
RBC: 3.38 MIL/uL — ABNORMAL LOW (ref 4.22–5.81)
RDW: 15.3 % (ref 11.5–15.5)
WBC: 10.4 10*3/uL (ref 4.0–10.5)

## 2016-08-05 LAB — COMPREHENSIVE METABOLIC PANEL
ALT: 16 U/L — ABNORMAL LOW (ref 17–63)
AST: 26 U/L (ref 15–41)
Albumin: 3.6 g/dL (ref 3.5–5.0)
Alkaline Phosphatase: 67 U/L (ref 38–126)
Anion gap: 13 (ref 5–15)
BUN: 19 mg/dL (ref 6–20)
CO2: 26 mmol/L (ref 22–32)
Calcium: 8.6 mg/dL — ABNORMAL LOW (ref 8.9–10.3)
Chloride: 97 mmol/L — ABNORMAL LOW (ref 101–111)
Creatinine, Ser: 1.67 mg/dL — ABNORMAL HIGH (ref 0.61–1.24)
GFR calc Af Amer: 45 mL/min — ABNORMAL LOW (ref >60)
GFR calc non Af Amer: 38 mL/min — ABNORMAL LOW (ref >60)
Glucose, Bld: 242 mg/dL — ABNORMAL HIGH (ref 65–99)
Potassium: 3.7 mmol/L (ref 3.5–5.1)
Sodium: 136 mmol/L (ref 135–145)
Total Bilirubin: 0.5 mg/dL (ref 0.3–1.2)
Total Protein: 7.8 g/dL (ref 6.5–8.1)

## 2016-08-05 LAB — I-STAT TROPONIN, ED: Troponin i, poc: 0 ng/mL (ref 0.00–0.08)

## 2016-08-05 LAB — BRAIN NATRIURETIC PEPTIDE: B Natriuretic Peptide: 222.6 pg/mL — ABNORMAL HIGH (ref 0.0–100.0)

## 2016-08-05 LAB — PROTIME-INR
INR: 2.16
Prothrombin Time: 24.4 seconds — ABNORMAL HIGH (ref 11.4–15.2)

## 2016-08-05 NOTE — ED Provider Notes (Addendum)
[facility]-EMERGENCY DEPT Provider Note   CSN: [id number] Arrival date & time: 08/05/16  1800     History   Chief Complaint Chief Complaint  Patient presents with  . Chest Pain    HPI Isaiah [name] is a 76 y.o. male.  The history is provided by the patient.  Chest Pain   This is a new problem. The current episode started 1 to 2 hours ago. The problem occurs rarely. The problem has been resolved. The pain is associated with rest. The pain is present in the substernal region. The pain is severe. The quality of the pain is described as pressure-like and sharp. The pain does not radiate. Duration of episode(s) is 1 hour. Exacerbated by: nothing. Associated symptoms include lower extremity edema (pedal edema) and shortness of breath. Pertinent negatives include no abdominal pain, no back pain, no cough, no diaphoresis, no dizziness, no exertional chest pressure, no fever, no leg pain, no nausea, no near-syncope, no numbness, no PND, no sputum production, no syncope, no vomiting and no weakness. He has tried nitroglycerin and antacids for the symptoms. The treatment provided significant relief. Risk factors include being elderly, male gender and obesity.  His past medical history is significant for CAD, CHF, diabetes, DVT, hyperlipidemia and hypertension.  His family medical history is significant for CAD, hyperlipidemia and hypertension.  Procedure history is positive for cardiac catheterization (1997, early 2000s).    Follows with Dr. Allyson Sabal from cardiology. 76 year old male with history of CAD status post CABG, atrial fibrillation and DVT on Coumadin, hypertension, hyperlipidemia, diabetes, and peripheral vascular disease who developed chest pain prior to arrival. Onset of chest pain while sitting on the couch at 4 PM. Sharp, pressure and tightness in nature. Pain nonradiating. I did have something to eat prior to this and took Gas-X without improvement. Took 2 doses of nitroglycerin, with some  improvement in his pain. EMS called afterwards and received 2 additional nitroglycerin and full dose aspirin with full resolution of pain. Does have associated shortness of breath with this, which is now resolved. Does not recall similar chest pain, but has not had chest pain over several years. No orthopnea or PND but has had some pedal edema recently in the setting of decreased Lasix due to kidney disease.  Past Medical History:  Diagnosis Date  . Atrial fibrillation (HCC)   . CAD (coronary artery disease)   . CHF (congestive heart failure) (HCC)   . Diabetes mellitus   . DVT (deep venous thrombosis) (HCC)   . Fall at home 10/2015  . Hyperlipidemia   . Hypertension   . Left-sided carotid artery disease (HCC)   . Myocardial infarction   . Peripheral vascular disease (HCC)   . Stroke (HCC)   . Venous insufficiency     Patient Active Problem List   Diagnosis Date Noted  . Chest pain 08/05/2016  . Pain in joint, lower leg 10/01/2014  . Coronary artery disease 04/27/2014  . Chronic atrial fibrillation (HCC) 04/27/2014  . Essential hypertension 04/27/2014  . Hyperlipidemia 04/27/2014  . Diabetes (HCC) 04/27/2014  . Obstructive sleep apnea 04/27/2014  . Stroke (HCC) 04/27/2014  . On continuous oral anticoagulation 04/27/2014  . Occlusion and stenosis of carotid artery without mention of cerebral infarction 02/04/2013  . Aftercare following surgery of the circulatory system, NEC 01/09/2012  . Chronic total occlusion of artery of the extremities (HCC) 01/09/2012    Past Surgical History:  Procedure Laterality Date  . CORONARY ARTERY BYPASS GRAFT  1997  vein harvest from right leg  . INGUINAL HERNIA REPAIR  1980's   Bilateral,  done in [facility]  . PR VEIN BYPASS GRAFT,AORTO-FEM-POP  1980   FEM-FEM BPG by [name]       Home Medications    Prior to Admission medications   Medication Sig Start Date End Date Taking? Authorizing Provider  allopurinol (ZYLOPRIM) 100 MG  tablet TAKE 2 TABLETS BY MOUTH DAILY. 05/03/14   Runell Gess, MD  aspirin 81 MG tablet Take 81 mg by mouth daily.      Historical Provider, MD  cloNIDine (CATAPRES) 0.1 MG tablet Take 1 tablet by mouth 2 (two) times daily.  12/09/12   Historical Provider, MD  enalapril (VASOTEC) 20 MG tablet Take 10 mg by mouth daily.     Historical Provider, MD  furosemide (LASIX) 40 MG tablet Take 40 mg by mouth 3 (three) times daily.     Historical Provider, MD  gabapentin (NEURONTIN) 100 MG capsule Take 1 capsule by mouth 3 (three) times daily.  01/28/13   Historical Provider, MD  glipiZIDE (GLUCOTROL XL) 10 MG 24 hr tablet Take 5 mg by mouth 2 (two) times daily.     Historical Provider, MD  hydrALAZINE (APRESOLINE) 50 MG tablet Take 50 mg by mouth 2 (two) times daily.  02/02/13   Historical Provider, MD  insulin regular (HUMULIN R,NOVOLIN R) 100 units/mL injection Inject 10 Units into the skin 3 (three) times daily before meals. 70 units twice daily and 30 units at bedtime.    Historical Provider, MD  isosorbide mononitrate (IMDUR) 60 MG 24 hr tablet Take 1 tablet (60 mg total) by mouth daily. Appointment needed for future refills 05/03/15   Runell Gess, MD  methylcellulose oral powder Take by mouth daily.      Historical Provider, MD  metolazone (ZAROXOLYN) 2.5 MG tablet Take 1 tablet (2.5 mg total) by mouth as needed. 04/29/14   Runell Gess, MD  metoprolol (LOPRESSOR) 50 MG tablet Take 125 mg by mouth 2 (two) times daily. Take 2 1/2 tablets BID ( 125mg )    Historical Provider, MD  Misc Natural Products (FIBER 7 PO) Take by mouth daily.    Historical Provider, MD  Multiple Vitamin (MULTIVITAMIN) tablet Take 1 tablet by mouth daily.      Historical Provider, MD  pantoprazole (PROTONIX) 40 MG tablet Take 40 mg by mouth 2 (two) times daily.    Historical Provider, MD  pravastatin (PRAVACHOL) 40 MG tablet Take 40 mg by mouth daily.    Historical Provider, MD  spironolactone (ALDACTONE) 25 MG tablet  Take 12.5 mg by mouth daily.  04/22/14   Historical Provider, MD  temazepam (RESTORIL) 15 MG capsule Take 15 mg by mouth at bedtime as needed.  08/30/15   Historical Provider, MD  traMADol (ULTRAM) 50 MG tablet Take 50 mg by mouth 3 (three) times daily as needed. 08/30/15   Historical Provider, MD  warfarin (COUMADIN) 5 MG tablet Take 5 mg by mouth daily. TAKE 4 MG Monday, Wednesday , AND Friday. TAKE 5 MG ALL OTHER DAYS.    Historical Provider, MD    Family History Family History  Problem Relation Age of Onset  . Other Mother     varicose veins  . Heart disease Mother   . Hyperlipidemia Mother   . Hypertension Mother   . Varicose Veins Mother   . Cancer Father   . Diabetes Father   . Heart disease Father  before age 76  . Hyperlipidemia Father   . Hypertension Father   . Other Father     varicose veins  . Heart attack Father   . Diabetes Daughter   . Hyperlipidemia Daughter   . Hypertension Daughter   . Other Daughter     varicose veins  . Varicose Veins Daughter   . Hypertension Son   . Diabetes Sister   . Heart disease Sister     DVT  . Other Sister     varicose veins  . Hyperlipidemia Sister   . Hypertension Sister   . Varicose Veins Sister   . Other Brother     varicose veins  . Hyperlipidemia Brother   . Hypertension Brother   . Heart attack Brother     Social History Social History  Substance Use Topics  . Smoking status: Former Smoker    Types: Cigarettes    Quit date: 05/14/1978  . Smokeless tobacco: Never Used  . Alcohol use No     Allergies   Patient has no known allergies.   Review of Systems Review of Systems  Constitutional: Negative for diaphoresis and fever.  HENT: Negative for congestion.   Respiratory: Positive for shortness of breath. Negative for cough and sputum production.   Cardiovascular: Positive for chest pain. Negative for syncope, PND and near-syncope.  Gastrointestinal: Negative for abdominal pain, nausea and vomiting.    Genitourinary: Negative for difficulty urinating.  Musculoskeletal: Negative for back pain.  Allergic/Immunologic: Negative for immunocompromised state.  Neurological: Negative for dizziness, weakness and numbness.  Hematological: Bruises/bleeds easily.  Psychiatric/Behavioral: Negative for confusion.  All other systems reviewed and are negative.    Physical Exam Updated Vital Signs BP (!) 136/54 (BP Location: Right Arm)   Pulse 91   Temp 97.9 F (36.6 C) (Oral)   Resp 18   SpO2 97%   Physical Exam Physical Exam  Nursing note and vitals reviewed. Constitutional:  non-toxic, and in no acute distress Head: Normocephalic and atraumatic.  Mouth/Throat: Oropharynx is clear and moist.  Neck: Normal range of motion. Neck supple.  Cardiovascular: Normal rate and regular rhythm.   Pulmonary/Chest: Effort normal and breath sounds normal.  Abdominal: Soft. There is no tenderness. There is no rebound and no guarding.  Musculoskeletal: Normal range of motion. pedal edema bilaterally Neurological: Alert, no facial droop, fluent speech, moves all extremities symmetrically Skin: Skin is warm and dry.  Psychiatric: Cooperative   ED Treatments / Results  Labs (all labs ordered are listed, but only abnormal results are displayed) Labs Reviewed  CBC WITH DIFFERENTIAL/PLATELET - Abnormal; Notable for the following:       Result Value   RBC 3.38 (*)    Hemoglobin 9.6 (*)    HCT 31.9 (*)    All other components within normal limits  COMPREHENSIVE METABOLIC PANEL - Abnormal; Notable for the following:    Chloride 97 (*)    Glucose, Bld 242 (*)    Creatinine, Ser 1.67 (*)    Calcium 8.6 (*)    ALT 16 (*)    GFR calc non Af Amer 38 (*)    GFR calc Af Amer 45 (*)    All other components within normal limits  PROTIME-INR - Abnormal; Notable for the following:    Prothrombin Time 24.4 (*)    All other components within normal limits  BRAIN NATRIURETIC PEPTIDE - Abnormal; Notable for the  following:    B Natriuretic Peptide 222.6 (*)    All other  components within normal limits  I-STAT TROPOININ, ED    EKG  EKG Interpretation None       Radiology Dg Chest 2 View  Result Date: 08/05/2016 CLINICAL DATA:  Sudden onset of mid sternal chest pain starting at 15:30 p.m. EXAM: CHEST  2 VIEW COMPARISON:  05/03/2013 FINDINGS: Cardiomediastinal silhouette is stable. Status post CABG. No infiltrate or pulmonary edema. Osteopenia and mild degenerative changes thoracic spine. IMPRESSION: No active cardiopulmonary disease. Status post CABG. Osteopenia and mild degenerative changes thoracic spine. Electronically Signed   By: Natasha Mead M.D.   On: 08/05/2016 19:04    Procedures Procedures (including critical care time)  Medications Ordered in ED Medications - No data to display   Initial Impression / Assessment and Plan / ED Course  I have reviewed the triage vital signs and the nursing notes.  Pertinent labs & imaging results that were available during my care of the patient were reviewed by me and considered in my medical decision making (see chart for details).      I reviewed the available records in [facility] and also Care Everywhere. Followed by Dr. Allyson Sabal from cardiology. Has complicated cardiac history.    Has been chest pain-free in the emergency department. Does not look fluid overloaded. He is no acute distress. Vitals are stable.  EKG without acute ischemic changes, but there is questionable second-degree AV block type II.  Is not bradycardic with this rhythm. Spoke with Dr. Delton See, cardiology fellow about this EKG. Did not feel it was likely type II given lack of bradycardia. Just recommending telemetry for now.   Troponin x 1 is normal. Chest x-ray visualized and there is no acute pulmonary processes such as edema, or pneumonia. Has baseline CKD.  He had already received a full dose aspirin prior to arrival. Discussed with Dr. Toniann Fail who will admit for remainder of  cardiac evaluation.  Final Clinical Impressions(s) / ED Diagnoses   Final diagnoses:  Precordial chest pain    New Prescriptions New Prescriptions   No medications on file     Lavera Guise, MD 08/05/16 2218    Lavera Guise, MD 08/06/16 [phone]

## 2016-08-05 NOTE — ED Triage Notes (Signed)
Pt arrives from home via [facility]. Pt states he had a sudden onset of CP at 1530 and took gasex with no relief. Pt states he then had 2 nitro and 324mg  of ASA and that took his pain from a 9/10-3/10. He then called EMS. He received an additional 2 nitro and is now pain free.

## 2016-08-05 NOTE — ED Notes (Signed)
Patient transported to X-ray 

## 2016-08-06 DIAGNOSIS — Z8673 Personal history of transient ischemic attack (TIA), and cerebral infarction without residual deficits: Secondary | ICD-10-CM | POA: Diagnosis not present

## 2016-08-06 DIAGNOSIS — I25110 Atherosclerotic heart disease of native coronary artery with unstable angina pectoris: Secondary | ICD-10-CM | POA: Diagnosis not present

## 2016-08-06 DIAGNOSIS — N183 Chronic kidney disease, stage 3 (moderate): Secondary | ICD-10-CM

## 2016-08-06 DIAGNOSIS — R072 Precordial pain: Secondary | ICD-10-CM | POA: Diagnosis not present

## 2016-08-06 DIAGNOSIS — D649 Anemia, unspecified: Secondary | ICD-10-CM | POA: Diagnosis not present

## 2016-08-06 DIAGNOSIS — D631 Anemia in chronic kidney disease: Secondary | ICD-10-CM | POA: Diagnosis present

## 2016-08-06 DIAGNOSIS — I200 Unstable angina: Secondary | ICD-10-CM | POA: Diagnosis not present

## 2016-08-06 DIAGNOSIS — I440 Atrioventricular block, first degree: Secondary | ICD-10-CM | POA: Diagnosis present

## 2016-08-06 DIAGNOSIS — I10 Essential (primary) hypertension: Secondary | ICD-10-CM

## 2016-08-06 DIAGNOSIS — I6529 Occlusion and stenosis of unspecified carotid artery: Secondary | ICD-10-CM | POA: Diagnosis present

## 2016-08-06 DIAGNOSIS — E1159 Type 2 diabetes mellitus with other circulatory complications: Secondary | ICD-10-CM

## 2016-08-06 DIAGNOSIS — I214 Non-ST elevation (NSTEMI) myocardial infarction: Secondary | ICD-10-CM | POA: Diagnosis not present

## 2016-08-06 DIAGNOSIS — E669 Obesity, unspecified: Secondary | ICD-10-CM | POA: Diagnosis present

## 2016-08-06 DIAGNOSIS — I482 Chronic atrial fibrillation: Secondary | ICD-10-CM

## 2016-08-06 DIAGNOSIS — I872 Venous insufficiency (chronic) (peripheral): Secondary | ICD-10-CM | POA: Diagnosis present

## 2016-08-06 DIAGNOSIS — I739 Peripheral vascular disease, unspecified: Secondary | ICD-10-CM | POA: Diagnosis present

## 2016-08-06 DIAGNOSIS — I480 Paroxysmal atrial fibrillation: Secondary | ICD-10-CM | POA: Diagnosis not present

## 2016-08-06 DIAGNOSIS — I7092 Chronic total occlusion of artery of the extremities: Secondary | ICD-10-CM | POA: Diagnosis not present

## 2016-08-06 DIAGNOSIS — Z87891 Personal history of nicotine dependence: Secondary | ICD-10-CM | POA: Diagnosis not present

## 2016-08-06 DIAGNOSIS — I441 Atrioventricular block, second degree: Secondary | ICD-10-CM | POA: Diagnosis not present

## 2016-08-06 DIAGNOSIS — D509 Iron deficiency anemia, unspecified: Secondary | ICD-10-CM | POA: Diagnosis present

## 2016-08-06 DIAGNOSIS — G4733 Obstructive sleep apnea (adult) (pediatric): Secondary | ICD-10-CM

## 2016-08-06 DIAGNOSIS — I5032 Chronic diastolic (congestive) heart failure: Secondary | ICD-10-CM | POA: Diagnosis not present

## 2016-08-06 DIAGNOSIS — E1151 Type 2 diabetes mellitus with diabetic peripheral angiopathy without gangrene: Secondary | ICD-10-CM | POA: Diagnosis present

## 2016-08-06 DIAGNOSIS — I130 Hypertensive heart and chronic kidney disease with heart failure and stage 1 through stage 4 chronic kidney disease, or unspecified chronic kidney disease: Secondary | ICD-10-CM | POA: Diagnosis not present

## 2016-08-06 DIAGNOSIS — R079 Chest pain, unspecified: Secondary | ICD-10-CM

## 2016-08-06 DIAGNOSIS — E1122 Type 2 diabetes mellitus with diabetic chronic kidney disease: Secondary | ICD-10-CM | POA: Diagnosis not present

## 2016-08-06 DIAGNOSIS — D638 Anemia in other chronic diseases classified elsewhere: Secondary | ICD-10-CM | POA: Diagnosis present

## 2016-08-06 DIAGNOSIS — E785 Hyperlipidemia, unspecified: Secondary | ICD-10-CM | POA: Diagnosis not present

## 2016-08-06 DIAGNOSIS — I493 Ventricular premature depolarization: Secondary | ICD-10-CM | POA: Diagnosis present

## 2016-08-06 LAB — PROTIME-INR
INR: 2.15
INR: 1.96
Prothrombin Time: 24.3 seconds — ABNORMAL HIGH (ref 11.4–15.2)
Prothrombin Time: 22.6 seconds — ABNORMAL HIGH (ref 11.4–15.2)

## 2016-08-06 LAB — MRSA PCR SCREENING: MRSA by PCR: POSITIVE — AB

## 2016-08-06 LAB — GLUCOSE, CAPILLARY
Glucose-Capillary: 153 mg/dL — ABNORMAL HIGH (ref 65–99)
Glucose-Capillary: 159 mg/dL — ABNORMAL HIGH (ref 65–99)
Glucose-Capillary: 230 mg/dL — ABNORMAL HIGH (ref 65–99)
Glucose-Capillary: 232 mg/dL — ABNORMAL HIGH (ref 65–99)
Glucose-Capillary: 264 mg/dL — ABNORMAL HIGH (ref 65–99)

## 2016-08-06 LAB — TROPONIN I
Troponin I: 0.03 ng/mL (ref <0.03)
Troponin I: 0.03 ng/mL (ref <0.03)
Troponin I: 0.03 ng/mL (ref <0.03)

## 2016-08-06 MED ORDER — MUPIROCIN 2 % EX OINT
1.0000 "application " | TOPICAL_OINTMENT | Freq: Two times a day (BID) | CUTANEOUS | Status: DC
  Administered 2016-08-06 – 2016-08-09 (×7): 1 via NASAL
  Filled 2016-08-06 (×3): qty 22

## 2016-08-06 MED ORDER — METOPROLOL TARTRATE 25 MG PO TABS: 50.0000 mg | ORAL_TABLET | Freq: Two times a day (BID) | ORAL | Status: DC

## 2016-08-06 MED ORDER — ENALAPRIL MALEATE 10 MG PO TABS
10.0000 mg | ORAL_TABLET | Freq: Every day | ORAL | Status: DC
  Filled 2016-08-06: qty 1

## 2016-08-06 MED ORDER — SODIUM CHLORIDE 0.9% FLUSH: 3.0000 mL | INTRAVENOUS | Status: DC | PRN

## 2016-08-06 MED ORDER — HYDRALAZINE HCL 50 MG PO TABS
100.0000 mg | ORAL_TABLET | Freq: Two times a day (BID) | ORAL | Status: DC
  Administered 2016-08-06 – 2016-08-09 (×8): 100 mg via ORAL
  Filled 2016-08-06 (×8): qty 2

## 2016-08-06 MED ORDER — ONDANSETRON HCL 4 MG/2ML IJ SOLN: 4.0000 mg | Freq: Four times a day (QID) | INTRAMUSCULAR | Status: DC | PRN

## 2016-08-06 MED ORDER — INSULIN ASPART 100 UNIT/ML ~~LOC~~ SOLN
0.0000 [IU] | Freq: Three times a day (TID) | SUBCUTANEOUS | Status: DC
  Administered 2016-08-06: 5 [IU] via SUBCUTANEOUS
  Administered 2016-08-06 – 2016-08-08 (×6): 3 [IU] via SUBCUTANEOUS
  Administered 2016-08-08: 2 [IU] via SUBCUTANEOUS
  Administered 2016-08-09 (×3): 3 [IU] via SUBCUTANEOUS

## 2016-08-06 MED ORDER — SODIUM CHLORIDE 0.9 % IV SOLN: 250.0000 mL | INTRAVENOUS | Status: DC | PRN

## 2016-08-06 MED ORDER — CHLORHEXIDINE GLUCONATE CLOTH 2 % EX PADS
6.0000 | MEDICATED_PAD | Freq: Every day | CUTANEOUS | Status: DC
  Administered 2016-08-06 – 2016-08-09 (×4): 6 via TOPICAL

## 2016-08-06 MED ORDER — FUROSEMIDE 20 MG PO TABS
40.0000 mg | ORAL_TABLET | Freq: Two times a day (BID) | ORAL | Status: DC
  Administered 2016-08-06 (×2): 40 mg via ORAL
  Filled 2016-08-06 (×2): qty 2

## 2016-08-06 MED ORDER — ISOSORBIDE MONONITRATE ER 60 MG PO TB24
60.0000 mg | ORAL_TABLET | Freq: Every day | ORAL | Status: DC
  Administered 2016-08-06 – 2016-08-09 (×4): 60 mg via ORAL
  Filled 2016-08-06: qty 2
  Filled 2016-08-06 (×2): qty 1
  Filled 2016-08-06: qty 2

## 2016-08-06 MED ORDER — ASPIRIN EC 81 MG PO TBEC
81.0000 mg | DELAYED_RELEASE_TABLET | Freq: Every day | ORAL | Status: DC
  Administered 2016-08-06: 81 mg via ORAL
  Filled 2016-08-06: qty 1

## 2016-08-06 MED ORDER — HEPARIN (PORCINE) IN NACL 100-0.45 UNIT/ML-% IJ SOLN
1800.0000 [IU]/h | INTRAMUSCULAR | Status: DC
  Administered 2016-08-06: 1400 [IU]/h via INTRAVENOUS
  Filled 2016-08-06: qty 250

## 2016-08-06 MED ORDER — ALLOPURINOL 100 MG PO TABS
100.0000 mg | ORAL_TABLET | Freq: Two times a day (BID) | ORAL | Status: DC
  Administered 2016-08-06 – 2016-08-09 (×8): 100 mg via ORAL
  Filled 2016-08-06 (×8): qty 1

## 2016-08-06 MED ORDER — GABAPENTIN 100 MG PO CAPS
100.0000 mg | ORAL_CAPSULE | Freq: Three times a day (TID) | ORAL | Status: DC
  Administered 2016-08-06 – 2016-08-09 (×10): 100 mg via ORAL
  Filled 2016-08-06 (×11): qty 1

## 2016-08-06 MED ORDER — QUETIAPINE FUMARATE 50 MG PO TABS
50.0000 mg | ORAL_TABLET | Freq: Every day | ORAL | Status: DC
  Administered 2016-08-06 (×2): 50 mg via ORAL
  Filled 2016-08-06 (×4): qty 1

## 2016-08-06 MED ORDER — ASPIRIN EC 81 MG PO TBEC: 81.0000 mg | DELAYED_RELEASE_TABLET | Freq: Every day | ORAL | Status: DC

## 2016-08-06 MED ORDER — SODIUM CHLORIDE 0.9% FLUSH
3.0000 mL | Freq: Two times a day (BID) | INTRAVENOUS | Status: DC
  Administered 2016-08-06 – 2016-08-07 (×2): 3 mL via INTRAVENOUS

## 2016-08-06 MED ORDER — INSULIN ASPART PROT & ASPART (70-30 MIX) 100 UNIT/ML ~~LOC~~ SUSP
50.0000 [IU] | Freq: Two times a day (BID) | SUBCUTANEOUS | Status: DC
  Administered 2016-08-06 – 2016-08-09 (×6): 50 [IU] via SUBCUTANEOUS
  Filled 2016-08-06 (×3): qty 10

## 2016-08-06 MED ORDER — ACETAMINOPHEN 325 MG PO TABS: 650.0000 mg | ORAL_TABLET | ORAL | Status: DC | PRN

## 2016-08-06 MED ORDER — PANTOPRAZOLE SODIUM 40 MG PO TBEC
40.0000 mg | DELAYED_RELEASE_TABLET | Freq: Two times a day (BID) | ORAL | Status: DC
  Administered 2016-08-06 – 2016-08-09 (×7): 40 mg via ORAL
  Filled 2016-08-06 (×8): qty 1

## 2016-08-06 MED ORDER — ADULT MULTIVITAMIN W/MINERALS CH
1.0000 | ORAL_TABLET | Freq: Every day | ORAL | Status: DC
  Administered 2016-08-06 – 2016-08-09 (×4): 1 via ORAL
  Filled 2016-08-06 (×4): qty 1

## 2016-08-06 MED ORDER — ASPIRIN EC 325 MG PO TBEC: 325.0000 mg | DELAYED_RELEASE_TABLET | Freq: Every day | ORAL | Status: DC

## 2016-08-06 MED ORDER — METOPROLOL TARTRATE 25 MG PO TABS
50.0000 mg | ORAL_TABLET | Freq: Two times a day (BID) | ORAL | Status: DC
  Administered 2016-08-06 – 2016-08-07 (×4): 50 mg via ORAL
  Filled 2016-08-06 (×2): qty 1
  Filled 2016-08-06: qty 2
  Filled 2016-08-06: qty 1
  Filled 2016-08-06: qty 2

## 2016-08-06 MED ORDER — PRAVASTATIN SODIUM 40 MG PO TABS
40.0000 mg | ORAL_TABLET | Freq: Every day | ORAL | Status: DC
  Administered 2016-08-06 – 2016-08-08 (×4): 40 mg via ORAL
  Filled 2016-08-06 (×4): qty 1

## 2016-08-06 NOTE — Progress Notes (Signed)
ANTICOAGULATION CONSULT NOTE  Pharmacy Consult for heparin when INR <2 Indication: atrial fibrillation and unstable angina  No Known Allergies   Vital Signs: Temp: 99.2 F (37.3 C) (03/26 0800) Temp Source: Oral (03/26 0800) BP: 110/54 (03/26 0513) Pulse Rate: 72 (03/26 0800)  Labs:  Recent Labs  08/05/16 1925 08/06/16 0016 08/06/16 0623  HGB 9.6*  --   --   HCT 31.9*  --   --   PLT 259  --   --   LABPROT 24.4*  --  24.3*  INR 2.16  --  2.15  CREATININE 1.67*  --   --   TROPONINI  --  0.03* 0.03*    Assessment: 76yo male c/o sudden onset of CP, no relief w/ Gas-X but some relief w/ NTG x2 and full relief after two more doses of NTG; troponin negative thus far. Pharmacy consulted to transition from Coumadin to heparin when INR <2 for unstable angina. Plan is for cath when INR <1.8. Current INR is 2.15 so heparin not needed yet.  Goal of Therapy:  Heparin level 0.3-0.7 units/ml Monitor platelets by anticoagulation protocol: Yes   Plan:  Heparin drip when INR <2 18:00 INR Daily INR   Loura BackJennifer [place], PharmD, BCPS Clinical Pharmacist Phone for today [phone] Main pharmacy - [phone] 08/06/2016 11:22 AM

## 2016-08-06 NOTE — Progress Notes (Signed)
ANTICOAGULATION CONSULT NOTE - Initial Consult  Pharmacy Consult for heparin Indication: atrial fibrillation  No Known Allergies   Vital Signs: Temp: 97.9 F (36.6 C) (03/25 1807) Temp Source: Oral (03/25 1807) BP: 135/60 (03/25 2300) Pulse Rate: 78 (03/25 2300)  Labs:  Recent Labs  08/05/16 1925  HGB 9.6*  HCT 31.9*  PLT 259  LABPROT 24.4*  INR 2.16  CREATININE 1.67*    Medical History: Past Medical History:  Diagnosis Date  . Atrial fibrillation (HCC)   . CAD (coronary artery disease)   . CHF (congestive heart failure) (HCC)   . Diabetes mellitus   . DVT (deep venous thrombosis) (HCC)   . Fall at home 10/2015  . Hyperlipidemia   . Hypertension   . Left-sided carotid artery disease (HCC)   . Myocardial infarction   . Peripheral vascular disease (HCC)   . Stroke (HCC)   . Venous insufficiency     Medications:  Prescriptions Prior to Admission  Medication Sig Dispense Refill Last Dose  . allopurinol (ZYLOPRIM) 100 MG tablet TAKE 2 TABLETS BY MOUTH DAILY. (Patient taking differently: TAKE 1 TABLET BY MOUTH TWICE DAILY) 30 tablet 0 08/05/2016 at am  . aspirin EC 81 MG tablet Take 81 mg by mouth daily.   08/05/2016 at am  . furosemide (LASIX) 40 MG tablet Take 40 mg by mouth See admin instructions. Take 1 tablet (40 mg) by mouth twice daily - morning and noon   08/05/2016 at noon  . gabapentin (NEURONTIN) 100 MG capsule Take 100 mg by mouth 3 (three) times daily.    08/05/2016 at noon  . glipiZIDE (GLUCOTROL) 5 MG tablet Take 5 mg by mouth 2 (two) times daily before a meal.   08/05/2016 at am  . hydrALAZINE (APRESOLINE) 100 MG tablet Take 100 mg by mouth 2 (two) times daily.   08/05/2016 at am  . insulin aspart protamine- aspart (NOVOLOG MIX 70/30) (70-30) 100 UNIT/ML injection Inject 100 Units into the skin 3 (three) times daily before meals.   08/05/2016 at breakfast  . isosorbide mononitrate (IMDUR) 60 MG 24 hr tablet Take 1 tablet (60 mg total) by mouth daily.  Appointment needed for future refills (Patient taking differently: Take 60 mg by mouth daily. ) 30 tablet 0 08/05/2016 at am  . metolazone (ZAROXOLYN) 2.5 MG tablet Take 1 tablet (2.5 mg total) by mouth as needed. (Patient taking differently: Take 2.5 mg by mouth as needed (fluid build up/ weight gain). ) 30 tablet 2 year ago  . metoprolol (LOPRESSOR) 50 MG tablet Take 50 mg by mouth 2 (two) times daily.    08/05/2016 at 1000  . Multiple Vitamin (MULTIVITAMIN WITH MINERALS) TABS tablet Take 1 tablet by mouth daily.   08/05/2016 at am  . pantoprazole (PROTONIX) 40 MG tablet Take 40 mg by mouth 2 (two) times daily before a meal.    08/05/2016 at am  . pravastatin (PRAVACHOL) 40 MG tablet Take 40 mg by mouth at bedtime.    08/04/2016 at pm  . PRESCRIPTION MEDICATION Inhale into the lungs at bedtime. CPAP   08/04/2016 at pm  . Psyllium (METAMUCIL PO) Take 2 capsules by mouth at bedtime.   08/04/2016 at pm  . QUEtiapine (SEROQUEL) 50 MG tablet Take 50 mg by mouth daily with supper.   08/04/2016 at pm  . traMADol (ULTRAM) 50 MG tablet Take 50-100 mg by mouth 3 (three) times daily as needed (pain).    08/04/2016 at bedtime  . warfarin (COUMADIN) 4  MG tablet Take 4 mg by mouth daily with supper.   08/04/2016 at betw 1600 & 1700  . enalapril (VASOTEC) 20 MG tablet Take 10 mg by mouth daily.    month ago   Scheduled:  . allopurinol  100 mg Oral BID  . aspirin EC  325 mg Oral Daily  . enalapril  10 mg Oral Daily  . furosemide  40 mg Oral See admin instructions  . gabapentin  100 mg Oral TID  . hydrALAZINE  100 mg Oral BID  . insulin aspart  0-9 Units Subcutaneous TID WC  . insulin aspart protamine- aspart  50 Units Subcutaneous BID WC  . isosorbide mononitrate  60 mg Oral Daily  . multivitamin with minerals  1 tablet Oral Daily  . pantoprazole  40 mg Oral BID AC  . pravastatin  40 mg Oral QHS  . QUEtiapine  50 mg Oral Q supper    Assessment: 76yo male c/o sudden onset of CP, no relief w/ Gas-X but some  relief w/ NTG x2 and full relief after two more doses of NTG; istat troponin negative, to transition from Coumadin to heparin for Afib in case invasive workup required; current INR >2 w/ last dose of Coumadin taken 3/24.  Goal of Therapy:  Heparin level 0.3-0.7 units/ml Monitor platelets by anticoagulation protocol: Yes   Plan:  Will monitor INR and start heparin when <2.  Vernard Gambles, PharmD, BCPS  08/06/2016,12:07 AM

## 2016-08-06 NOTE — Progress Notes (Signed)
Patient placed on CPAP without complication. RT will continue to monitor as needed. 

## 2016-08-06 NOTE — Progress Notes (Signed)
CRITICAL VALUE ALERT  Critical value received:  troponin  Date of notification:  08/06/16  Time of notification:  0120  Critical value read back:Yes.    Nurse who received alert:  L Hitt RN  MD notified (1st page):  Toniann FailKakrakandy  Time of first page:  0120  MD notified (2nd page):  Time of second page:  Responding MD:  Toniann FailKakrakandy  Time MD responded:  0121  No new orders at this time, will continue to monitor. Patient denies chest pain at this time.

## 2016-08-06 NOTE — Progress Notes (Signed)
ANTICOAGULATION CONSULT NOTE - Initial Consult  Pharmacy Consult for Heparin Indication: atrial fibrillation  No Known Allergies  Patient Measurements: Height: 5\' 11"  (180.3 cm) Weight: 250 lb (113.4 kg) IBW/kg (Calculated) : 75.3 Heparin Dosing Weight: 100 kg  Vital Signs: Temp: 98.9 F (37.2 C) (03/26 1535) Temp Source: Oral (03/26 1535) BP: 112/55 (03/26 1535) Pulse Rate: 73 (03/26 1535)  Labs:  Recent Labs  08/05/16 1925 08/06/16 0016 08/06/16 0623 08/06/16 1128 08/06/16 1821  HGB 9.6*  --   --   --   --   HCT 31.9*  --   --   --   --   PLT 259  --   --   --   --   LABPROT 24.4*  --  24.3*  --  22.6*  INR 2.16  --  2.15  --  1.96  CREATININE 1.67*  --   --   --   --   TROPONINI  --  0.03* 0.03* 0.03*  --     Estimated Creatinine Clearance: 48.9 mL/min (A) (by C-G formula based on SCr of 1.67 mg/dL (H)).   Medical History: Past Medical History:  Diagnosis Date  . Atrial fibrillation (HCC)   . CAD (coronary artery disease)   . CHF (congestive heart failure) (HCC)   . Diabetes mellitus   . DVT (deep venous thrombosis) (HCC)   . Fall at home 10/2015  . Hyperlipidemia   . Hypertension   . Left-sided carotid artery disease (HCC)   . Myocardial infarction   . Peripheral vascular disease (HCC)   . Stroke (HCC)   . Venous insufficiency     Assessment:  Anticoag: Heparin when INR <2 for [place]/possible NSTEMI; current INR >2 w/ last dose of Coumadin taken 3/24 for afib. Hgb 9.6, pltc wnl. PM: INR down to 1.96. Start IV heparin.  Goal of Therapy:  Heparin level 0.3-0.7 units/ml Monitor platelets by anticoagulation protocol: Yes   Plan:  Start IV heparin (no bolus) at 1400 units/hr Check heparin level and CBC in AM   Tycen Dockter S. Merilynn Finlandobertson, PharmD, BCPS Clinical Staff Pharmacist Pager [phone]  Misty Stanleyobertson, Magdaleno Lortie Stillinger 08/06/2016,7:19 PM

## 2016-08-06 NOTE — Care Management Obs Status (Signed)
MEDICARE OBSERVATION STATUS NOTIFICATION   Patient Details  Name: Isaiah [name] MRN: [id number] Date of Birth: 30-Jan-1941   Medicare Observation Status Notification Given:  Yes    Gala LewandowskyGraves-Bigelow, Silus Lanzo Kaye, RN 08/06/2016, 12:20 PM

## 2016-08-06 NOTE — Progress Notes (Signed)
[facility] PROGRESS NOTE  VALIANT DILLS ZOX:[id number] DOB: Mar 19, 1941 DOA: 08/05/2016  PCP: Burman Blacksmith., MD  Brief History/Interval Summary: 76 year old male with a past medical history of coronary artery disease status post angioplasty in 1984, followed by CABG in 1997, chronic atrial fibrillation on warfarin, diabetes mellitus type 2, essential hypertension, sleep apnea, chronic kidney disease and anemia presented to the hospital with complaints of chest pain. He took 4 sublingual nitroglycerin including one given by EMS following which his symptoms resolved. He was hospitalized for further management.  Reason for Visit: Chest pain  Consultants: Cardiology  Procedures: Plan is for cardiac catheterization tomorrow, 3/27.  Antibiotics: None  Subjective/Interval History: Patient feels well this morning. Denies any chest pain, shortness of breath, nausea or vomiting. Denies any dizziness/lightheadedness.  ROS: Denies headaches  Objective:  Vital Signs  Vitals:   08/06/16 0507 08/06/16 0510 08/06/16 0513 08/06/16 0800  BP: (!) 115/51 (!) 115/51 (!) 110/54   Pulse: 76 75 76 72  Resp: (!) 21 20 15 18   Temp: 99.7 F (37.6 C)   99.2 F (37.3 C)  TempSrc:    Oral  SpO2: 93% 93% 94% 100%  Weight: 113.4 kg (250 lb)     Height:        Intake/Output Summary (Last 24 hours) at 08/06/16 1201 Last data filed at 08/06/16 0900  Gross per 24 hour  Intake               90 ml  Output              725 ml  Net             -635 ml   Filed Weights   08/06/16 0013 08/06/16 0507  Weight: 113.2 kg (249 lb 9.6 oz) 113.4 kg (250 lb)    General appearance: alert, cooperative, appears stated age and no distress Resp: clear to auscultation bilaterally Cardio: regular rate and rhythm, S1, S2 normal, no murmur, click, rub or gallop GI: soft, non-tender; bowel sounds normal; no masses,  no organomegaly Extremities: extremities normal, atraumatic, no cyanosis or edema Neurologic:  Awake and alert. Cranial nerves II through XII intact. Motor strength equal bilateral upper and lower extremities.  Lab Results:  Data Reviewed: I have personally reviewed following labs and imaging studies  CBC:  Recent Labs Lab 08/05/16 1925  WBC 10.4  NEUTROABS 7.1  HGB 9.6*  HCT 31.9*  MCV 94.4  PLT 259    Basic Metabolic Panel:  Recent Labs Lab 08/05/16 1925  NA 136  K 3.7  CL 97*  CO2 26  GLUCOSE 242*  BUN 19  CREATININE 1.67*  CALCIUM 8.6*    GFR: Estimated Creatinine Clearance: 48.9 mL/min (A) (by C-G formula based on SCr of 1.67 mg/dL (H)).  Liver Function Tests:  Recent Labs Lab 08/05/16 1925  AST 26  ALT 16*  ALKPHOS 67  BILITOT 0.5  PROT 7.8  ALBUMIN 3.6    Coagulation Profile:  Recent Labs Lab 08/05/16 1925 08/06/16 0623  INR 2.16 2.15    Cardiac Enzymes:  Recent Labs Lab 08/06/16 0016 08/06/16 0623  TROPONINI 0.03* 0.03*   CBG:  Recent Labs Lab 08/06/16 0026 08/06/16 0739 08/06/16 1106  GLUCAP 153* 230* 264*     Recent Results (from the past 240 hour(s))  MRSA PCR Screening     Status: Abnormal   Collection Time: 08/06/16 12:20 AM  Result Value Ref Range Status   MRSA by PCR POSITIVE (A) NEGATIVE  Final    Comment:        The GeneXpert MRSA Assay (FDA approved for NASAL specimens only), is one component of a comprehensive MRSA colonization surveillance program. It is not intended to diagnose MRSA infection nor to guide or monitor treatment for MRSA infections. RESULT CALLED TO, READ BACK BY AND VERIFIED WITH: HITT,L RN [phone] 08/06/16 MITCHELL,L       Radiology Studies: Dg Chest 2 View  Result Date: 08/05/2016 CLINICAL DATA:  Sudden onset of mid sternal chest pain starting at 15:30 p.m. EXAM: CHEST  2 VIEW COMPARISON:  05/03/2013 FINDINGS: Cardiomediastinal silhouette is stable. Status post CABG. No infiltrate or pulmonary edema. Osteopenia and mild degenerative changes thoracic spine. IMPRESSION: No  active cardiopulmonary disease. Status post CABG. Osteopenia and mild degenerative changes thoracic spine. Electronically Signed   By: Natasha MeadLiviu  Pop M.D.   On: 08/05/2016 19:04     Medications:  Scheduled: . allopurinol  100 mg Oral BID  . aspirin EC  81 mg Oral Daily  . Chlorhexidine Gluconate Cloth  6 each Topical Q0600  . [START ON 08/07/2016] enalapril  10 mg Oral Daily  . furosemide  40 mg Oral BID WC  . gabapentin  100 mg Oral TID  . hydrALAZINE  100 mg Oral BID  . insulin aspart  0-9 Units Subcutaneous TID WC  . insulin aspart protamine- aspart  50 Units Subcutaneous BID WC  . isosorbide mononitrate  60 mg Oral Daily  . metoprolol tartrate  50 mg Oral BID  . multivitamin with minerals  1 tablet Oral Daily  . mupirocin ointment  1 application Nasal BID  . pantoprazole  40 mg Oral BID AC  . pravastatin  40 mg Oral QHS  . QUEtiapine  50 mg Oral Q supper   Continuous:  JXB:[id number]:acetaminophen, ondansetron (ZOFRAN) IV  Assessment/Plan:  Principal Problem:   Unstable angina (HCC) Active Problems:   Chronic total occlusion of artery of the extremities (HCC)   Coronary artery disease   Essential hypertension   Hyperlipidemia   Type 2 diabetes mellitus with vascular disease (HCC)   Obstructive sleep apnea   CKD (chronic kidney disease), stage III   Normochromic normocytic anemia    Chest pain concerning for unstable angina Very minimal elevation in troponin. Patient is chest pain-free this morning. Heparin has been ordered. Cardiology consulted. Continue with beta blocker, aspirin and statin. Plan is for cardiac catheterization tomorrow considering his history of known CAD.   Diabetes mellitus type 2 Patient is on NovoLog 70/30 large doses at home. Patient's dose was reduced. Continue to monitor CBGs. SSI.  Chronic atrial fibrillation Heart rate is stable. Patient is on metoprolol. Coumadin on hold in anticipation of procedure and is placed on heparin.  Essential Hypertension    Monitor blood pressures. Continue home medications.   Obstructive sleep apnea  on CPAP.  History of diastolic CHF  Last EF measured was 50-55% with grade 1 diastolic dysfunction in 2014 - appears compensated. May have to hold his diuretics if he is to undergo cardiac catheterization. Will also need to hold his ACE inhibitor. Echocardiogram has been ordered.  Chronic anemia probably from chronic disease Follow CBC.  Chronic kidney disease stage III Creatinine appears to be at baseline. Monitor urine output. Check creatinine tomorrow. Minimize contrast use with cardiac catheterization.  DVT Prophylaxis: On anticoagulation    Code Status: Full code  Family Communication: Discussed with the patient  Disposition Plan: Management as outlined above.    LOS: 0 days  [facility]  [facility] Pager [phone] 08/06/2016, 12:01 PM  If 7PM-7AM, please contact night-coverage at [email or web address], password [facility]

## 2016-08-06 NOTE — Care Management Note (Signed)
Case Management Note  Patient Details  Name: Isaiah [name] MRN: [id number] Date of Birth: 27-Mar-1941  Subjective/Objective:   Pt presented for Chest Pian. Pt is from home with daughter and her husband. Pt has DME: RW and CPAP at home. Pt is on 02 @ 3L and Staff RN trying to wean. PT will benefit from PT/OT consult.                  Action/Plan: CM will continue to monitor for additional needs.   Expected Discharge Date:                  Expected Discharge Plan:  Home w Home Health Services  In-House Referral:  NA  Discharge planning Services  CM Consult  Post Acute Care Choice:    Choice offered to:     DME Arranged:    DME Agency:     HH Arranged:    HH Agency:     Status of Service:  In process, will continue to follow  If discussed at Long Length of Stay Meetings, dates discussed:    Additional Comments:  Gala LewandowskyGraves-Bigelow, Davis Ambrosini Kaye, RN 08/06/2016, 12:20 PM

## 2016-08-06 NOTE — Progress Notes (Signed)
Pt family at bedside. Update given.  Pt walking with walker and family members. Pt ambulated 100 feet, tolerated well on 2L  Isaiah [name]

## 2016-08-06 NOTE — H&P (Addendum)
History and Physical    Isaiah [name] ZOX:[id number] DOB: 1940/06/12 DOA: 08/05/2016  PCP: Burman Blacksmith., MD  Patient coming from: Home.  Chief Complaint: Chest pain.  HPI: Isaiah [name] is a 76 y.o. male with history of CAD status post angioplasty in 1994 followed by CABG in 1997, chronic atrial fibrillation, diabetes mellitus type 2, hypertension, hyperlipidemia, sleep apnea, chronic kidney disease and chronic anemia was brought to the ER after patient started experiencing chest pain last evening at home. Pain was retrosternal pressure-like nonradiating present even at rest. Patient had taken at least a total of 4 sublingual nitroglycerin including the one given by EMS following which patient's chest pain resolved.  ED Course: Chest x-ray was unremarkable and troponin was negative. EKG initially was showing possible second-degree AV block and the following one was showing possible junctional rhythm but on call cardiologist felt that patient probably had normal sinus rhythm since patient's rate was in the 70's. Patient at the time of my exam was chest pain-free. And admitted for further management.  Review of Systems: As per HPI, rest all negative.   Past Medical History:  Diagnosis Date  . Atrial fibrillation (HCC)   . CAD (coronary artery disease)   . CHF (congestive heart failure) (HCC)   . Diabetes mellitus   . DVT (deep venous thrombosis) (HCC)   . Fall at home 10/2015  . Hyperlipidemia   . Hypertension   . Left-sided carotid artery disease (HCC)   . Myocardial infarction   . Peripheral vascular disease (HCC)   . Stroke (HCC)   . Venous insufficiency     Past Surgical History:  Procedure Laterality Date  . CORONARY ARTERY BYPASS GRAFT  1997   vein harvest from right leg  . INGUINAL HERNIA REPAIR  1980's   Bilateral,  done in [facility]  . PR VEIN BYPASS GRAFT,AORTO-FEM-POP  1980   FEM-FEM BPG by [name]     reports that he quit smoking about 38 years ago.  His smoking use included Cigarettes. He has never used smokeless tobacco. He reports that he does not drink alcohol or use drugs.  No Known Allergies  Family History  Problem Relation Age of Onset  . Other Mother     varicose veins  . Heart disease Mother   . Hyperlipidemia Mother   . Hypertension Mother   . Varicose Veins Mother   . Cancer Father   . Diabetes Father   . Heart disease Father     before age 29  . Hyperlipidemia Father   . Hypertension Father   . Other Father     varicose veins  . Heart attack Father   . Diabetes Daughter   . Hyperlipidemia Daughter   . Hypertension Daughter   . Other Daughter     varicose veins  . Varicose Veins Daughter   . Hypertension Son   . Diabetes Sister   . Heart disease Sister     DVT  . Other Sister     varicose veins  . Hyperlipidemia Sister   . Hypertension Sister   . Varicose Veins Sister   . Other Brother     varicose veins  . Hyperlipidemia Brother   . Hypertension Brother   . Heart attack Brother     Prior to Admission medications   Medication Sig Start Date End Date Taking? Authorizing Provider  allopurinol (ZYLOPRIM) 100 MG tablet TAKE 2 TABLETS BY MOUTH DAILY. Patient taking differently: TAKE 1 TABLET BY  MOUTH TWICE DAILY 05/03/14  Yes Runell GessJonathan J Berry, MD  aspirin EC 81 MG tablet Take 81 mg by mouth daily.   Yes Historical Provider, MD  furosemide (LASIX) 40 MG tablet Take 40 mg by mouth See admin instructions. Take 1 tablet (40 mg) by mouth twice daily - morning and noon   Yes Historical Provider, MD  gabapentin (NEURONTIN) 100 MG capsule Take 100 mg by mouth 3 (three) times daily.  01/28/13  Yes Historical Provider, MD  glipiZIDE (GLUCOTROL) 5 MG tablet Take 5 mg by mouth 2 (two) times daily before a meal.   Yes Historical Provider, MD  hydrALAZINE (APRESOLINE) 100 MG tablet Take 100 mg by mouth 2 (two) times daily.   Yes Historical Provider, MD  insulin aspart protamine- aspart (NOVOLOG MIX 70/30) (70-30) 100  UNIT/ML injection Inject 100 Units into the skin 3 (three) times daily before meals.   Yes Historical Provider, MD  isosorbide mononitrate (IMDUR) 60 MG 24 hr tablet Take 1 tablet (60 mg total) by mouth daily. Appointment needed for future refills Patient taking differently: Take 60 mg by mouth daily.  05/03/15  Yes Runell GessJonathan J Berry, MD  metolazone (ZAROXOLYN) 2.5 MG tablet Take 1 tablet (2.5 mg total) by mouth as needed. Patient taking differently: Take 2.5 mg by mouth as needed (fluid build up/ weight gain).  04/29/14  Yes Runell GessJonathan J Berry, MD  metoprolol (LOPRESSOR) 50 MG tablet Take 50 mg by mouth 2 (two) times daily.    Yes Historical Provider, MD  Multiple Vitamin (MULTIVITAMIN WITH MINERALS) TABS tablet Take 1 tablet by mouth daily.   Yes Historical Provider, MD  pantoprazole (PROTONIX) 40 MG tablet Take 40 mg by mouth 2 (two) times daily before a meal.    Yes Historical Provider, MD  pravastatin (PRAVACHOL) 40 MG tablet Take 40 mg by mouth at bedtime.    Yes Historical Provider, MD  PRESCRIPTION MEDICATION Inhale into the lungs at bedtime. CPAP   Yes Historical Provider, MD  Psyllium (METAMUCIL PO) Take 2 capsules by mouth at bedtime.   Yes Historical Provider, MD  QUEtiapine (SEROQUEL) 50 MG tablet Take 50 mg by mouth daily with supper.   Yes Historical Provider, MD  traMADol (ULTRAM) 50 MG tablet Take 50-100 mg by mouth 3 (three) times daily as needed (pain).  08/30/15  Yes Historical Provider, MD  warfarin (COUMADIN) 4 MG tablet Take 4 mg by mouth daily with supper.   Yes Historical Provider, MD  enalapril (VASOTEC) 20 MG tablet Take 10 mg by mouth daily.     Historical Provider, MD    Physical Exam: Vitals:   08/05/16 2215 08/05/16 2230 08/05/16 2245 08/05/16 2300  BP: 135/62 (!) 145/63 (!) 139/59 135/60  Pulse: 78 80 83 78  Resp: 18 19 (!) 24 (!) 22  Temp:      TempSrc:      SpO2: 96% 95% 90% 96%      Constitutional: Moderately built and nourished. Vitals:   08/05/16 2215  08/05/16 2230 08/05/16 2245 08/05/16 2300  BP: 135/62 (!) 145/63 (!) 139/59 135/60  Pulse: 78 80 83 78  Resp: 18 19 (!) 24 (!) 22  Temp:      TempSrc:      SpO2: 96% 95% 90% 96%   Eyes: Anicteric no pallor. ENMT: No discharge from the ears eyes nose or mouth. Neck: No mass felt. No JVD appreciated. Respiratory: No rhonchi or crepitations. Cardiovascular: S1-S2 heard no murmurs appreciated. Abdomen: Soft nontender bowel sounds present.  Hernia present. Musculoskeletal: No edema. No joint effusion. Skin: No rash. Skin appears warm. Neurologic: Alert awake oriented to time place and person. Moves all extremities. Psychiatric: Appears normal. Normal affect.   Labs on Admission: I have personally reviewed following labs and imaging studies  CBC:  Recent Labs Lab 08/05/16 1925  WBC 10.4  NEUTROABS 7.1  HGB 9.6*  HCT 31.9*  MCV 94.4  PLT 259   Basic Metabolic Panel:  Recent Labs Lab 08/05/16 1925  NA 136  K 3.7  CL 97*  CO2 26  GLUCOSE 242*  BUN 19  CREATININE 1.67*  CALCIUM 8.6*   GFR: CrCl cannot be calculated (Unknown ideal weight.). Liver Function Tests:  Recent Labs Lab 08/05/16 1925  AST 26  ALT 16*  ALKPHOS 67  BILITOT 0.5  PROT 7.8  ALBUMIN 3.6   No results for input(s): LIPASE, AMYLASE in the last 168 hours. No results for input(s): AMMONIA in the last 168 hours. Coagulation Profile:  Recent Labs Lab 08/05/16 1925  INR 2.16   Cardiac Enzymes: No results for input(s): CKTOTAL, CKMB, CKMBINDEX, TROPONINI in the last 168 hours. BNP (last 3 results) No results for input(s): PROBNP in the last 8760 hours. HbA1C: No results for input(s): HGBA1C in the last 72 hours. CBG: No results for input(s): GLUCAP in the last 168 hours. Lipid Profile: No results for input(s): CHOL, HDL, LDLCALC, TRIG, CHOLHDL, LDLDIRECT in the last 72 hours. Thyroid Function Tests: No results for input(s): TSH, T4TOTAL, FREET4, T3FREE, THYROIDAB in the last 72  hours. Anemia Panel: No results for input(s): VITAMINB12, FOLATE, FERRITIN, TIBC, IRON, RETICCTPCT in the last 72 hours. Urine analysis: No results found for: COLORURINE, APPEARANCEUR, LABSPEC, PHURINE, GLUCOSEU, HGBUR, BILIRUBINUR, KETONESUR, PROTEINUR, UROBILINOGEN, NITRITE, LEUKOCYTESUR Sepsis Labs: @LABRCNTIP (procalcitonin:4,lacticidven:4) )No results found for this or any previous visit (from the past 240 hour(s)).   Radiological Exams on Admission: Dg Chest 2 View  Result Date: 08/05/2016 CLINICAL DATA:  Sudden onset of mid sternal chest pain starting at 15:30 p.m. EXAM: CHEST  2 VIEW COMPARISON:  05/03/2013 FINDINGS: Cardiomediastinal silhouette is stable. Status post CABG. No infiltrate or pulmonary edema. Osteopenia and mild degenerative changes thoracic spine. IMPRESSION: No active cardiopulmonary disease. Status post CABG. Osteopenia and mild degenerative changes thoracic spine. Electronically Signed   By: Natasha Mead M.D.   On: 08/05/2016 19:04    EKG: Independently reviewed. First EKG shows possible second-degree AV block. Second EKG shows possible junctional rhythm.  Assessment/Plan Principal Problem:   Chest pain Active Problems:   Chronic total occlusion of artery of the extremities (HCC)   Chronic atrial fibrillation (HCC)   Essential hypertension   Type 2 diabetes mellitus with vascular disease (HCC)   Obstructive sleep apnea   CKD (chronic kidney disease), stage III   Normochromic normocytic anemia    1. Chest pain concerning for unstable angina - patient after receiving sublingual nitroglycerin has been chest pain-free. In anticipation of procedure will hold Coumadin and keep patient on heparin. Continue with beta blockers aspirin statins and patient is on Imdur. 2. Diabetes mellitus type 2 - patient is on NovoLog 70/30 large doses. Since patient will nothing by mouth in a.m. I am decreasing the dose by half. Restart home dose once procedures are done and patient is  back to his regular diet. 3. Chronic atrial fibrillation - on metoprolol and Coumadin. Coumadin on hold in anticipation of procedure and is placed on heparin. 4. Hypertension - on metoprolol hydralazine lisinopril and Imdur. 5. Obstructive sleep  apnea on CPAP. 6. History of diastolic CHF last EF measured was 50-55% with grade 1 diastolic dysfunction in 2014 - appears compensated. 7. Chronic anemia probably from chronic disease - follow CBC. 8. Chronic kidney disease stage III - creatinine appears to be at baseline.   DVT prophylaxis: Heparin. Code Status: Full code.  Family Communication: Patient's daughters.  Disposition Plan: Home.  Consults called: Cardiology.  Admission status: Observation.    Eduard Clos MD [facility] Pager [phone].  If 7PM-7AM, please contact night-coverage [email or web address] Password [facility]  08/06/2016, 12:05 AM

## 2016-08-06 NOTE — Progress Notes (Signed)
Patient transferred from ED with RN, patient awake and oriented, denies chest pain at this time. Family at bedside, patient and family oriented to call bell and unit. Will cont to monitor.

## 2016-08-06 NOTE — Consult Note (Signed)
CARDIOLOGY CONSULT NOTE   Patient ID: Isaiah [name] MRN: [id number] DOB/AGE: 1941-02-08 76 y.o.  Admit date: 08/05/2016  Primary Physician   Burman Blacksmith., MD Primary Cardiologist   Dr. Allyson Sabal Reason for Consultation   Chest pain  Requesting Physician  Dr. Rito Ehrlich  HPI: Isaiah [name] is a 76 y.o. male with a history of CAD s/p CABG in 1997, PAF on coumadin, HTN, HLD, DM, CVA,  OSA on CPAP, CKD stage III PAD and carotid artery disease presents for chest pain.   History of coronary artery disease status post anterior wall myocardial infarction back in 1994 treated with IV streptokinase and angioplasty. Several years later he had stenting of his LAD which really developed "in-stent restenosis requiring multivessel coronary artery bypass grafting in 1997 He has not required catheterization since. Last myoview in 2013 was low risk.   Patient has chronic total occlusion of artery of the extremities and carotid artery stenosis. Both been followed by [name].   He was doing well on cardiac stand point when last seen by Dr. Allyson Sabal 08/2015.  Recently noted dyspnea on exertion without chest pain. Yesterday patient had substernal chest pressure 10/10 while watching TV. Associated with SOB and nausea. No diaphoresis or radiation. Took "some indigestion meds" but no improvement. Took  SL nitro with minimal improvement. Total 4 SL nitro including one given by EMS with resolution of CP. Similar to prior angina in 1990s. Required nitro in many years. Chest pain free since here. The patient denies nausea, vomiting, fever, palpitations, orthopnea, PND, dizziness, syncope, cough, congestion, abdominal pain, hematochezia, melena, lower extremity edema.  EKG showed sinus rhythm with PACs. Troponin 0.03 x 2. BNP 1.67. INR 2.1. hgb 9.6. BNP 222. Compliant with medication.   Past Medical History:  Diagnosis Date  . Atrial fibrillation (HCC)   . CAD (coronary artery disease)   . CHF (congestive heart  failure) (HCC)   . Diabetes mellitus   . DVT (deep venous thrombosis) (HCC)   . Fall at home 10/2015  . Hyperlipidemia   . Hypertension   . Left-sided carotid artery disease (HCC)   . Myocardial infarction   . Peripheral vascular disease (HCC)   . Stroke (HCC)   . Venous insufficiency      Past Surgical History:  Procedure Laterality Date  . CORONARY ARTERY BYPASS GRAFT  1997   vein harvest from right leg  . INGUINAL HERNIA REPAIR  1980's   Bilateral,  done in [facility]  . PR VEIN BYPASS GRAFT,AORTO-FEM-POP  1980   FEM-FEM BPG by [name]    No Known Allergies  I have reviewed the patient's current medications . allopurinol  100 mg Oral BID  . aspirin EC  325 mg Oral Daily  . Chlorhexidine Gluconate Cloth  6 each Topical Q0600  . enalapril  10 mg Oral Daily  . furosemide  40 mg Oral BID WC  . gabapentin  100 mg Oral TID  . hydrALAZINE  100 mg Oral BID  . insulin aspart  0-9 Units Subcutaneous TID WC  . insulin aspart protamine- aspart  50 Units Subcutaneous BID WC  . isosorbide mononitrate  60 mg Oral Daily  . metoprolol tartrate  50 mg Oral BID  . multivitamin with minerals  1 tablet Oral Daily  . mupirocin ointment  1 application Nasal BID  . pantoprazole  40 mg Oral BID AC  . pravastatin  40 mg Oral QHS  . QUEtiapine  50 mg Oral Q supper  acetaminophen, ondansetron (ZOFRAN) IV  Prior to Admission medications   Medication Sig Start Date End Date Taking? Authorizing Provider  allopurinol (ZYLOPRIM) 100 MG tablet TAKE 2 TABLETS BY MOUTH DAILY. Patient taking differently: TAKE 1 TABLET BY MOUTH TWICE DAILY 05/03/14  Yes Runell Gess, MD  aspirin EC 81 MG tablet Take 81 mg by mouth daily.   Yes Historical Provider, MD  furosemide (LASIX) 40 MG tablet Take 40 mg by mouth See admin instructions. Take 1 tablet (40 mg) by mouth twice daily - morning and noon   Yes Historical Provider, MD  gabapentin (NEURONTIN) 100 MG capsule Take 100 mg by mouth 3 (three)  times daily.  01/28/13  Yes Historical Provider, MD  glipiZIDE (GLUCOTROL) 5 MG tablet Take 5 mg by mouth 2 (two) times daily before a meal.   Yes Historical Provider, MD  hydrALAZINE (APRESOLINE) 100 MG tablet Take 100 mg by mouth 2 (two) times daily.   Yes Historical Provider, MD  insulin aspart protamine- aspart (NOVOLOG MIX 70/30) (70-30) 100 UNIT/ML injection Inject 100 Units into the skin 3 (three) times daily before meals.   Yes Historical Provider, MD  isosorbide mononitrate (IMDUR) 60 MG 24 hr tablet Take 1 tablet (60 mg total) by mouth daily. Appointment needed for future refills Patient taking differently: Take 60 mg by mouth daily.  05/03/15  Yes Runell Gess, MD  metolazone (ZAROXOLYN) 2.5 MG tablet Take 1 tablet (2.5 mg total) by mouth as needed. Patient taking differently: Take 2.5 mg by mouth as needed (fluid build up/ weight gain).  04/29/14  Yes Runell Gess, MD  metoprolol (LOPRESSOR) 50 MG tablet Take 50 mg by mouth 2 (two) times daily.    Yes Historical Provider, MD  Multiple Vitamin (MULTIVITAMIN WITH MINERALS) TABS tablet Take 1 tablet by mouth daily.   Yes Historical Provider, MD  pantoprazole (PROTONIX) 40 MG tablet Take 40 mg by mouth 2 (two) times daily before a meal.    Yes Historical Provider, MD  pravastatin (PRAVACHOL) 40 MG tablet Take 40 mg by mouth at bedtime.    Yes Historical Provider, MD  PRESCRIPTION MEDICATION Inhale into the lungs at bedtime. CPAP   Yes Historical Provider, MD  Psyllium (METAMUCIL PO) Take 2 capsules by mouth at bedtime.   Yes Historical Provider, MD  QUEtiapine (SEROQUEL) 50 MG tablet Take 50 mg by mouth daily with supper.   Yes Historical Provider, MD  traMADol (ULTRAM) 50 MG tablet Take 50-100 mg by mouth 3 (three) times daily as needed (pain).  08/30/15  Yes Historical Provider, MD  warfarin (COUMADIN) 4 MG tablet Take 4 mg by mouth daily with supper.   Yes Historical Provider, MD  enalapril (VASOTEC) 20 MG tablet Take 10 mg by mouth  daily.     Historical Provider, MD     Social History   Social History  . Marital status: Divorced    Spouse name: N/A  . Number of children: N/A  . Years of education: N/A   Occupational History  . Not on file.   Social History Main Topics  . Smoking status: Former Smoker    Types: Cigarettes    Quit date: 05/14/1978  . Smokeless tobacco: Never Used  . Alcohol use No  . Drug use: No  . Sexual activity: Not on file   Other Topics Concern  . Not on file   Social History Narrative  . No narrative on file    Family Status  Relation Status  .  Mother Deceased at age 55  . Father Deceased at age 53  . Daughter Alive  . Son Alive  . Sister   . Brother    Family History  Problem Relation Age of Onset  . Other Mother     varicose veins  . Heart disease Mother   . Hyperlipidemia Mother   . Hypertension Mother   . Varicose Veins Mother   . Cancer Father   . Diabetes Father   . Heart disease Father     before age 55  . Hyperlipidemia Father   . Hypertension Father   . Other Father     varicose veins  . Heart attack Father   . Diabetes Daughter   . Hyperlipidemia Daughter   . Hypertension Daughter   . Other Daughter     varicose veins  . Varicose Veins Daughter   . Hypertension Son   . Diabetes Sister   . Heart disease Sister     DVT  . Other Sister     varicose veins  . Hyperlipidemia Sister   . Hypertension Sister   . Varicose Veins Sister   . Other Brother     varicose veins  . Hyperlipidemia Brother   . Hypertension Brother   . Heart attack Brother      ROS:  Full 14 point review of systems complete and found to be negative unless listed above.  Physical Exam: Blood pressure (!) 110/54, pulse 76, temperature 99.7 F (37.6 C), resp. rate 15, height 5\' 11"  (1.803 m), weight 250 lb (113.4 kg), SpO2 94 %.  General: Well developed, well nourished, male in no acute distress Head: Eyes PERRLA, No xanthomas. Normocephalic and atraumatic, oropharynx  without edema or exudate.  Lungs: Resp regular and unlabored, CTA. Heart: RRR no s3, s4, or murmurs..   Neck: No carotid bruits. No lymphadenopathy. No  JVD. Abdomen: Bowel sounds present, abdomen soft and non-tender without masses or hernias noted. Msk:  No spine or cva tenderness. No weakness, no joint deformities or effusions. Extremities: No clubbing, cyanosis or edema. DP/PT/Radials 2+ and equal bilaterally. Neuro: Alert and oriented X 3. No focal deficits noted. Psych:  Good affect, responds appropriately Skin: No rashes or lesions noted.  Labs:   Lab Results  Component Value Date   WBC 10.4 08/05/2016   HGB 9.6 (L) 08/05/2016   HCT 31.9 (L) 08/05/2016   MCV 94.4 08/05/2016   PLT 259 08/05/2016    Recent Labs  08/06/16 0623  INR 2.15     Recent Labs Lab 08/05/16 1925  NA 136  K 3.7  CL 97*  CO2 26  BUN 19  CREATININE 1.67*  CALCIUM 8.6*  PROT 7.8  BILITOT 0.5  ALKPHOS 67  ALT 16*  AST 26  GLUCOSE 242*  ALBUMIN 3.6   Magnesium  Date Value Ref Range Status  12/04/2010 2.4 1.5 - 2.5 mg/dL Final    Recent Labs  16/10/96 0016 08/06/16 0623  TROPONINI 0.03* 0.03*    Recent Labs  08/05/16 1932  TROPIPOC 0.00   No results found for: PROBNP Lab Results  Component Value Date   CHOL 112 04/27/2014   HDL 29 (L) 04/27/2014   LDLCALC 26 04/27/2014   TRIG 287 (H) 04/27/2014   No results found for: DDIMER No results found for: LIPASE, AMYLASE TSH  Date/Time Value Ref Range Status  01/28/2013 11:27 AM 3.598 0.350 - 4.500 uIU/mL Final   No results found for: VITAMINB12, FOLATE, FERRITIN, TIBC, IRON, RETICCTPCT  Echo: 01/2013 Study Conclusions  - Left ventricle: The cavity size was normal. Wall thickness was increased in a pattern of mild LVH. There was mild concentric hypertrophy. Systolic function was normal. The estimated ejection fraction was in the range of 50% to 55%. Mild hypokinesis of the inferior myocardium.  Moderate hypokinesis of the apical myocardium. Doppler parameters are consistent with abnormal left ventricular relaxation (grade 1 diastolic dysfunction). - Mitral valve: Mild to moderate regurgitation. - Left atrium: The atrium was mildly dilated. - Right atrium: The atrium was mildly dilated. - Atrial septum: No defect or patent foramen ovale was identified.  Radiology:  Dg Chest 2 View  Result Date: 08/05/2016 CLINICAL DATA:  Sudden onset of mid sternal chest pain starting at 15:30 p.m. EXAM: CHEST  2 VIEW COMPARISON:  05/03/2013 FINDINGS: Cardiomediastinal silhouette is stable. Status post CABG. No infiltrate or pulmonary edema. Osteopenia and mild degenerative changes thoracic spine. IMPRESSION: No active cardiopulmonary disease. Status post CABG. Osteopenia and mild degenerative changes thoracic spine. Electronically Signed   By: Natasha Mead M.D.   On: 08/05/2016 19:04    ASSESSMENT AND PLAN:     1. Unstable angina - Chest pain resolved with SL nitro x 4. Chest pain free since there. Similar to prior angina. EKG without acute findings. Troponin flat. Stop coumadin. Start IV heparin per pharmacy when INR less than 2. Cath tomorrow when INR less than 1.8. Continue ASA 81, Imdur and statin.   2. PAF - Intermittent P wave. Rate stable. CHADSVASc of 7. Anticoagulation as above.   3. HLD - Check lipid panel. Continue statin. Managed by PCP.   4. CKD stage II - Scr stable. Will hold lasix and ACE today. Gentle hydration overnight. Normal EF on echo in 2014. Pending echo this admission.   5. Normochromic normocytic anemia - per primary   6. HTN - Stable  Signed: Bhagat,Bhavinkumar, PA 08/06/2016, 7:38 AM Pager [phone]  Patient seen and examined and history reviewed. Agree with above findings and plan. 76 yo WM with remote anterior MI treated with IV STK and later stenting of LAD. s/p CABG x 4 by Dr. Donata Clay in 1997. This included a LIMA to the LAD, SVG sequentially to  D1 and 2, and SVG to OM. By cardiac cath in 2002 the grafts were open. SVG to OM noted to arise from body of SVG to diagonal.  Last Myoview in 2013 showed inferolateral scar with EF 50%. Last Echo in 2014 showed apical WMA with EF 50%. Patient doing well until yesterday when he experienced 10/10 SSCP. Thought it was indigestion. Did not resolve with AA therapy. Took Ntg at home (? Old Ntg)- this helped some but later got complete relief with Ntg in ambulance. Now pain free. Troponin slightly elevated to 0.03. Ecg shows old anterior infarct. NSR with 1st degree AV block vs junctional rhythm. I have personally reviewed and interpreted this study.  On exam he is an obese WM in NAD. No JVD. + left carotid bruit. Lungs are clear. CV RRR without gallop, murmur or click. Abdomen obese without masses. LE with absent pulses on right. 2+ left femoral and 1+ left DP pulse. Radial pulses 2+ bilateral.   Patient has known severe PAD. He is s/p fem-fem bypass in the past but this had to be removed in 2012 due to infection. Followed by [name].  Impression: Unstable angina/ possible NSTEMI. Continue to cycle troponin. Coumadin on hold. Plan Cardiac cath once INR < 1.8. Will need access via  left radial or left femoral due to PAD and bypass grafts. Will hold diuretics and ACEi for cath given CKD and gently hydrate overnight. Will check Echo today so no need for ventriculogram. Patient's chronic anemia and CKD appear to be stable from baseline. Need to assess lipid parameters since this has not been done recently.   Eily Louvier [place], MDFACC 08/06/2016 8:51 AM

## 2016-08-07 ENCOUNTER — Other Ambulatory Visit (HOSPITAL_COMMUNITY): Payer: Medicare Other

## 2016-08-07 ENCOUNTER — Other Ambulatory Visit: Payer: Self-pay

## 2016-08-07 ENCOUNTER — Encounter (HOSPITAL_COMMUNITY)
Admission: EM | Disposition: A | Discharge status: Home / Self Care | Payer: Self-pay | Source: Home / Self Care | Attending: Internal Medicine

## 2016-08-07 DIAGNOSIS — I25110 Atherosclerotic heart disease of native coronary artery with unstable angina pectoris: Secondary | ICD-10-CM

## 2016-08-07 DIAGNOSIS — D649 Anemia, unspecified: Secondary | ICD-10-CM

## 2016-08-07 HISTORY — PX: CORONARY STENT INTERVENTION: CATH118234

## 2016-08-07 HISTORY — PX: LEFT HEART CATH AND CORS/GRAFTS ANGIOGRAPHY: CATH118250

## 2016-08-07 LAB — LIPID PANEL
Cholesterol: 79 mg/dL (ref 0–200)
HDL: 26 mg/dL — ABNORMAL LOW (ref >40)
LDL Cholesterol: 22 mg/dL (ref 0–99)
Total CHOL/HDL Ratio: 3 RATIO
Triglycerides: 154 mg/dL — ABNORMAL HIGH (ref <150)
VLDL: 31 mg/dL (ref 0–40)

## 2016-08-07 LAB — BASIC METABOLIC PANEL
Anion gap: 11 (ref 5–15)
BUN: 18 mg/dL (ref 6–20)
CO2: 28 mmol/L (ref 22–32)
Calcium: 8.7 mg/dL — ABNORMAL LOW (ref 8.9–10.3)
Chloride: 98 mmol/L — ABNORMAL LOW (ref 101–111)
Creatinine, Ser: 1.57 mg/dL — ABNORMAL HIGH (ref 0.61–1.24)
GFR calc Af Amer: 48 mL/min — ABNORMAL LOW (ref >60)
GFR calc non Af Amer: 41 mL/min — ABNORMAL LOW (ref >60)
Glucose, Bld: 232 mg/dL — ABNORMAL HIGH (ref 65–99)
Potassium: 3.9 mmol/L (ref 3.5–5.1)
Sodium: 137 mmol/L (ref 135–145)

## 2016-08-07 LAB — CBC
HCT: 28.9 % — ABNORMAL LOW (ref 39.0–52.0)
Hemoglobin: 8.4 g/dL — ABNORMAL LOW (ref 13.0–17.0)
MCH: 27.7 pg (ref 26.0–34.0)
MCHC: 29.1 g/dL — ABNORMAL LOW (ref 30.0–36.0)
MCV: 95.4 fL (ref 78.0–100.0)
Platelets: 220 10*3/uL (ref 150–400)
RBC: 3.03 MIL/uL — ABNORMAL LOW (ref 4.22–5.81)
RDW: 14.9 % (ref 11.5–15.5)
WBC: 8.6 10*3/uL (ref 4.0–10.5)

## 2016-08-07 LAB — POCT ACTIVATED CLOTTING TIME: Activated Clotting Time: 571 seconds

## 2016-08-07 LAB — GLUCOSE, CAPILLARY
Glucose-Capillary: 234 mg/dL — ABNORMAL HIGH (ref 65–99)
Glucose-Capillary: 229 mg/dL — ABNORMAL HIGH (ref 65–99)
Glucose-Capillary: 205 mg/dL — ABNORMAL HIGH (ref 65–99)
Glucose-Capillary: 169 mg/dL — ABNORMAL HIGH (ref 65–99)

## 2016-08-07 LAB — PROTIME-INR
INR: 1.86
INR: 1.63
Prothrombin Time: 21.7 seconds — ABNORMAL HIGH (ref 11.4–15.2)
Prothrombin Time: 19.5 seconds — ABNORMAL HIGH (ref 11.4–15.2)

## 2016-08-07 LAB — HEPARIN LEVEL (UNFRACTIONATED): Heparin Unfractionated: <0.1 IU/mL — ABNORMAL LOW (ref 0.30–0.70)

## 2016-08-07 SURGERY — LEFT HEART CATH AND CORS/GRAFTS ANGIOGRAPHY
Anesthesia: LOCAL

## 2016-08-07 MED ORDER — SODIUM CHLORIDE 0.9 % IV SOLN: 250.0000 mL | INTRAVENOUS | Status: DC | PRN

## 2016-08-07 MED ORDER — SODIUM CHLORIDE 0.9% FLUSH: 3.0000 mL | INTRAVENOUS | Status: DC | PRN

## 2016-08-07 MED ORDER — LIDOCAINE HCL (PF) 1 % IJ SOLN
INTRAMUSCULAR | Status: DC | PRN
  Administered 2016-08-07: 4 mL

## 2016-08-07 MED ORDER — BIVALIRUDIN BOLUS VIA INFUSION - CUPID
INTRAVENOUS | Status: DC | PRN
  Administered 2016-08-07: 79.425 mg via INTRAVENOUS

## 2016-08-07 MED ORDER — BIVALIRUDIN 250 MG IV SOLR
INTRAVENOUS | Status: AC
  Filled 2016-08-07: qty 250

## 2016-08-07 MED ORDER — HEPARIN (PORCINE) IN NACL 2-0.9 UNIT/ML-% IJ SOLN
INTRAMUSCULAR | Status: DC | PRN
  Administered 2016-08-07: 1000 mL

## 2016-08-07 MED ORDER — ENALAPRIL MALEATE 10 MG PO TABS: 10.0000 mg | ORAL_TABLET | Freq: Every day | ORAL | Status: DC

## 2016-08-07 MED ORDER — DOCUSATE SODIUM 100 MG PO CAPS
100.0000 mg | ORAL_CAPSULE | Freq: Two times a day (BID) | ORAL | Status: DC
  Administered 2016-08-07 – 2016-08-09 (×4): 100 mg via ORAL
  Filled 2016-08-07 (×4): qty 1

## 2016-08-07 MED ORDER — VERAPAMIL HCL 2.5 MG/ML IV SOLN
INTRAVENOUS | Status: AC
  Filled 2016-08-07: qty 2

## 2016-08-07 MED ORDER — CLOPIDOGREL BISULFATE 300 MG PO TABS
ORAL_TABLET | ORAL | Status: AC
  Filled 2016-08-07: qty 2

## 2016-08-07 MED ORDER — FUROSEMIDE 40 MG PO TABS: 40.0000 mg | ORAL_TABLET | Freq: Two times a day (BID) | ORAL | Status: DC

## 2016-08-07 MED ORDER — FENTANYL CITRATE (PF) 100 MCG/2ML IJ SOLN
INTRAMUSCULAR | Status: DC | PRN
  Administered 2016-08-07 (×2): 25 ug via INTRAVENOUS

## 2016-08-07 MED ORDER — HEPARIN (PORCINE) IN NACL 2-0.9 UNIT/ML-% IJ SOLN
INTRAMUSCULAR | Status: AC
  Filled 2016-08-07: qty 1000

## 2016-08-07 MED ORDER — ATROPINE SULFATE 1 MG/10ML IJ SOSY
PREFILLED_SYRINGE | INTRAMUSCULAR | Status: AC
  Filled 2016-08-07: qty 10

## 2016-08-07 MED ORDER — ASPIRIN 81 MG PO CHEW
81.0000 mg | CHEWABLE_TABLET | Freq: Every day | ORAL | Status: DC
  Administered 2016-08-08: 81 mg via ORAL
  Filled 2016-08-07: qty 1

## 2016-08-07 MED ORDER — SODIUM CHLORIDE 0.9% FLUSH
3.0000 mL | Freq: Two times a day (BID) | INTRAVENOUS | Status: DC
  Administered 2016-08-07 – 2016-08-09 (×3): 3 mL via INTRAVENOUS

## 2016-08-07 MED ORDER — ONDANSETRON HCL 4 MG/2ML IJ SOLN: 4.0000 mg | Freq: Four times a day (QID) | INTRAMUSCULAR | Status: DC | PRN

## 2016-08-07 MED ORDER — HEPARIN SODIUM (PORCINE) 1000 UNIT/ML IJ SOLN
INTRAMUSCULAR | Status: DC | PRN
  Administered 2016-08-07: 5000 [IU] via INTRAVENOUS

## 2016-08-07 MED ORDER — IOPAMIDOL (ISOVUE-370) INJECTION 76%
INTRAVENOUS | Status: DC | PRN
  Administered 2016-08-07: 175 mL via INTRA_ARTERIAL

## 2016-08-07 MED ORDER — HEPARIN SODIUM (PORCINE) 1000 UNIT/ML IJ SOLN
INTRAMUSCULAR | Status: AC
  Filled 2016-08-07: qty 1

## 2016-08-07 MED ORDER — LABETALOL HCL 5 MG/ML IV SOLN: 10.0000 mg | INTRAVENOUS | Status: AC | PRN

## 2016-08-07 MED ORDER — MIDAZOLAM HCL 2 MG/2ML IJ SOLN
INTRAMUSCULAR | Status: AC
  Filled 2016-08-07: qty 2

## 2016-08-07 MED ORDER — CLOPIDOGREL BISULFATE 75 MG PO TABS
75.0000 mg | ORAL_TABLET | Freq: Every day | ORAL | Status: DC
  Administered 2016-08-08 – 2016-08-09 (×2): 75 mg via ORAL
  Filled 2016-08-07 (×2): qty 1

## 2016-08-07 MED ORDER — ACETAMINOPHEN 325 MG PO TABS: 650.0000 mg | ORAL_TABLET | ORAL | Status: DC | PRN

## 2016-08-07 MED ORDER — LIDOCAINE HCL (PF) 1 % IJ SOLN
INTRAMUSCULAR | Status: AC
  Filled 2016-08-07: qty 30

## 2016-08-07 MED ORDER — ASPIRIN 81 MG PO CHEW
81.0000 mg | CHEWABLE_TABLET | ORAL | Status: AC
  Administered 2016-08-07: 81 mg via ORAL
  Filled 2016-08-07: qty 1

## 2016-08-07 MED ORDER — SODIUM CHLORIDE 0.9 % IV SOLN
INTRAVENOUS | Status: DC
  Administered 2016-08-07: 03:00:00 via INTRAVENOUS

## 2016-08-07 MED ORDER — MIDAZOLAM HCL 2 MG/2ML IJ SOLN
INTRAMUSCULAR | Status: DC | PRN
  Administered 2016-08-07: 1 mg via INTRAVENOUS

## 2016-08-07 MED ORDER — VERAPAMIL HCL 2.5 MG/ML IV SOLN
INTRAVENOUS | Status: DC | PRN
  Administered 2016-08-07: 10 mL via INTRA_ARTERIAL

## 2016-08-07 MED ORDER — SODIUM CHLORIDE 0.9 % IV SOLN: INTRAVENOUS | Status: AC

## 2016-08-07 MED ORDER — SODIUM CHLORIDE 0.9 % IV SOLN
INTRAVENOUS | Status: DC | PRN
  Administered 2016-08-07: 1.75 mg/kg/h via INTRAVENOUS

## 2016-08-07 MED ORDER — CLOPIDOGREL BISULFATE 300 MG PO TABS
ORAL_TABLET | ORAL | Status: DC | PRN
  Administered 2016-08-07: 600 mg via ORAL

## 2016-08-07 MED ORDER — ASPIRIN EC 81 MG PO TBEC: 81.0000 mg | DELAYED_RELEASE_TABLET | Freq: Every day | ORAL | Status: DC

## 2016-08-07 MED ORDER — HYDRALAZINE HCL 20 MG/ML IJ SOLN
5.0000 mg | INTRAMUSCULAR | Status: AC | PRN
  Administered 2016-08-07: 21:00:00 5 mg via INTRAVENOUS
  Filled 2016-08-07: qty 1

## 2016-08-07 MED ORDER — IOPAMIDOL (ISOVUE-370) INJECTION 76%
INTRAVENOUS | Status: AC
  Filled 2016-08-07: qty 125

## 2016-08-07 MED ORDER — IOPAMIDOL (ISOVUE-370) INJECTION 76%
INTRAVENOUS | Status: AC
  Filled 2016-08-07: qty 100

## 2016-08-07 MED ORDER — ATROPINE SULFATE 1 MG/10ML IJ SOSY
PREFILLED_SYRINGE | INTRAMUSCULAR | Status: DC | PRN
  Administered 2016-08-07: 0.5 mg via INTRAVENOUS

## 2016-08-07 MED ORDER — FENTANYL CITRATE (PF) 100 MCG/2ML IJ SOLN
INTRAMUSCULAR | Status: AC
  Filled 2016-08-07: qty 2

## 2016-08-07 SURGICAL SUPPLY — 24 items
BALLN EMERGE MR 2.5X12 (BALLOONS) ×2
BALLN EUPHORA RX 3.5X12 (BALLOONS) ×2
BALLOON EMERGE MR 2.5X12 (BALLOONS) IMPLANT
BALLOON EUPHORA RX 3.5X12 (BALLOONS) IMPLANT
CATH INFINITI 5 FR IM (CATHETERS) ×1 IMPLANT
CATH INFINITI 5 FR LCB (CATHETERS) ×1 IMPLANT
CATH INFINITI 5FR AL1 (CATHETERS) ×1 IMPLANT
CATH INFINITI 5FR JL4 (CATHETERS) ×1 IMPLANT
CATH INFINITI JR4 5F (CATHETERS) ×1 IMPLANT
CATH VISTA GUIDE 6FR XBRCA (CATHETERS) ×1 IMPLANT
DEVICE RAD COMP TR BAND LRG (VASCULAR PRODUCTS) ×2 IMPLANT
GLIDESHEATH SLEND SS 6F .021 (SHEATH) ×1 IMPLANT
GUIDEWIRE INQWIRE 1.5J.035X260 (WIRE) IMPLANT
INQWIRE 1.5J .035X260CM (WIRE) ×2
KIT ENCORE 26 ADVANTAGE (KITS) ×1 IMPLANT
KIT HEART LEFT (KITS) ×2 IMPLANT
PACK CARDIAC CATHETERIZATION (CUSTOM PROCEDURE TRAY) ×2 IMPLANT
STENT XIENCE ALPINE RX 4.0X23 (Permanent Stent) ×1 IMPLANT
TRANSDUCER W/STOPCOCK (MISCELLANEOUS) ×2 IMPLANT
TUBING CIL FLEX 10 FLL-RA (TUBING) ×2 IMPLANT
VALVE GUARDIAN II ~~LOC~~ HEMO (MISCELLANEOUS) ×1 IMPLANT
WIRE ASAHI PROWATER 180CM (WIRE) ×2 IMPLANT
WIRE HI TORQ BMW 190CM (WIRE) ×1 IMPLANT
WIRE HI TORQ VERSACORE-J 145CM (WIRE) ×1 IMPLANT

## 2016-08-07 NOTE — Interval H&P Note (Signed)
Cath Lab Visit (complete for each Cath Lab visit)  Clinical Evaluation Leading to the Procedure:   ACS: Yes.    Non-ACS:    Anginal Classification: CCS IV  Anti-ischemic medical therapy: Minimal Therapy (1 class of medications)  Non-Invasive Test Results: No non-invasive testing performed  Prior CABG: Previous CABG      History and Physical Interval Note:  08/07/2016 2:47 PM  Oran Reinobert W Incorvaia  has presented today for surgery, with the diagnosis of unstable angina  The various methods of treatment have been discussed with the patient and family. After consideration of risks, benefits and other options for treatment, the patient has consented to  Procedure(s): Left Heart Cath and Cors/Grafts Angiography (N/A) as a surgical intervention .  The patient's history has been reviewed, patient examined, no change in status, stable for surgery.  I have reviewed the patient's chart and labs.  Questions were answered to the patient's satisfaction.     Lance MussJayadeep Maritsa Hunsucker

## 2016-08-07 NOTE — Progress Notes (Signed)
Progress Note  Patient Name: Isaiah [name] Date of Encounter: 08/07/2016  Primary Cardiologist: Dr. Allyson SabalBerry   Subjective   No complains. No chest pain of dyspnea.   Inpatient Medications    Scheduled Meds: . allopurinol  100 mg Oral BID  . [START ON 08/08/2016] aspirin EC  81 mg Oral Daily  . Chlorhexidine Gluconate Cloth  6 each Topical Q0600  . enalapril  10 mg Oral Daily  . furosemide  40 mg Oral BID WC  . gabapentin  100 mg Oral TID  . hydrALAZINE  100 mg Oral BID  . insulin aspart  0-9 Units Subcutaneous TID WC  . insulin aspart protamine- aspart  50 Units Subcutaneous BID WC  . isosorbide mononitrate  60 mg Oral Daily  . metoprolol tartrate  50 mg Oral BID  . multivitamin with minerals  1 tablet Oral Daily  . mupirocin ointment  1 application Nasal BID  . pantoprazole  40 mg Oral BID AC  . pravastatin  40 mg Oral QHS  . QUEtiapine  50 mg Oral Q supper  . sodium chloride flush  3 mL Intravenous Q12H   Continuous Infusions: . sodium chloride 50 mL/hr at 08/07/16 0309  . heparin 1,400 Units/hr (08/06/16 2054)   PRN Meds: sodium chloride, acetaminophen, ondansetron (ZOFRAN) IV, sodium chloride flush   Vital Signs    Vitals:   08/06/16 1535 08/06/16 2203 08/07/16 0023 08/07/16 0507  BP: (!) 112/55 139/64  (!) 145/62  Pulse: 73 77 74 65  Resp: 17 19 20 15   Temp: 98.9 F (37.2 C) 98.6 F (37 C)  98.5 F (36.9 C)  TempSrc: Oral     SpO2: 100% 96% 92% 95%  Weight:    233 lb 6.4 oz (105.9 kg)  Height:        Intake/Output Summary (Last 24 hours) at 08/07/16 0845 Last data filed at 08/07/16 0834  Gross per 24 hour  Intake              480 ml  Output             4250 ml  Net            -3770 ml   Filed Weights   08/06/16 0013 08/06/16 0507 08/07/16 0507  Weight: 249 lb 9.6 oz (113.2 kg) 250 lb (113.4 kg) 233 lb 6.4 oz (105.9 kg)    Telemetry    NSR with rare PVC, prolonged PR interval  - Personally Reviewed  ECG    None today  Physical Exam    GEN: No acute distress.  On CPAP Neck: No JVD + left carotid bruit Cardiac: RRR, no murmurs, rubs, or gallops. LE with absent pulses on right. 2+ left femoral and 1+ left DP pulse. Radial pulses 2+ bilateral Respiratory: Clear to auscultation bilaterally. GI: Soft, nontender, non-distended  MS: No edema; No deformity. Neuro:  Nonfocal  Psych: Normal affect   Labs    Chemistry Recent Labs Lab 08/05/16 1925 08/07/16 0526  NA 136 137  K 3.7 3.9  CL 97* 98*  CO2 26 28  GLUCOSE 242* 232*  BUN 19 18  CREATININE 1.67* 1.57*  CALCIUM 8.6* 8.7*  PROT 7.8  --   ALBUMIN 3.6  --   AST 26  --   ALT 16*  --   ALKPHOS 67  --   BILITOT 0.5  --   GFRNONAA 38* 41*  GFRAA 45* 48*  ANIONGAP 13 11  Hematology Recent Labs Lab 08/05/16 1925 08/07/16 0526  WBC 10.4 8.6  RBC 3.38* 3.03*  HGB 9.6* 8.4*  HCT 31.9* 28.9*  MCV 94.4 95.4  MCH 28.4 27.7  MCHC 30.1 29.1*  RDW 15.3 14.9  PLT 259 220    Cardiac Enzymes Recent Labs Lab 08/06/16 0016 08/06/16 0623 08/06/16 1128  TROPONINI 0.03* 0.03* 0.03*    Recent Labs Lab 08/05/16 1932  TROPIPOC 0.00     BNP Recent Labs Lab 08/05/16 1925  BNP 222.6*     DDimer No results for input(s): DDIMER in the last 168 hours.   Radiology    Dg Chest 2 View  Result Date: 08/05/2016 CLINICAL DATA:  Sudden onset of mid sternal chest pain starting at 15:30 p.m. EXAM: CHEST  2 VIEW COMPARISON:  05/03/2013 FINDINGS: Cardiomediastinal silhouette is stable. Status post CABG. No infiltrate or pulmonary edema. Osteopenia and mild degenerative changes thoracic spine. IMPRESSION: No active cardiopulmonary disease. Status post CABG. Osteopenia and mild degenerative changes thoracic spine. Electronically Signed   By: Natasha Mead M.D.   On: 08/05/2016 19:04    Cardiac Studies   Pending echo and cath today   Patient Profile     Isaiah [name] is a 76 y.o. male with a history of CAD s/p CABG in 1997, PAF on coumadin, HTN, HLD, DM, CVA,  OSA on CPAP, CKD stage III,  PAD and carotid artery disease presents for chest pain.    Assessment & Plan    1. Unstable angina/ possible NSTEMI. - troponin low flat trend. Coumadin on hold. On heparin now. INR of 1.86 this morning. Access via left radial or left femoral due to PAD and bypass grafts. No ventriculogram. Gently hydrated overnight. Scr stable. Hold  hold diuretics and ACEi for cath given CKD, resume tomorrow. Chest pain free. Continue ASA 81, Imdur, BB and statin. Will repeat INR at noon prior to cath. Pending echo.   2. PAF - Sinus rhythm on tele. Rate stable. CHADSVASc of 7. Anticoagulation as above.   3. PAD and carotid artery disease - Followed by [name].  4. HLD - Pending labs. Continue Pravastatin. If LDL not at goal, change to lipitor.   5. CKD, stage III - Scr stable  6. Anemia - Per primary   7. HTN - BP stable on current medications    Signed, Bhagat,Bhavinkumar, PA  08/07/2016, 8:45 AM    Patient seen and examined and history reviewed. Agree with above findings and plan. Patient is without symptoms today. Awaiting cardiac cath. INR today 1.86. Now on IV heparin. Will repeat INR today at noon. If < 1.8 will proceed with procedure. If still high may need to postpone until tomorrow.   Peter [place], MDFACC 08/07/2016 10:29 AM

## 2016-08-07 NOTE — Progress Notes (Addendum)
[facility] PROGRESS NOTE  Isaiah [name] ZOX:[id number] DOB: 1940/06/07 DOA: 08/05/2016  PCP: Burman Blacksmith., MD  Brief History/Interval Summary: 76 year old male with a past medical history of coronary artery disease status post angioplasty in 1984, followed by CABG in 1997, chronic atrial fibrillation on warfarin, diabetes mellitus type 2, essential hypertension, sleep apnea, chronic kidney disease and anemia presented to the hospital with complaints of chest pain. He took 4 sublingual nitroglycerin including one given by EMS following which his symptoms resolved. He was hospitalized for further management. Cardiology was consulted. Plan is for cardiac catheterization today. If INR less than 1.8.  Reason for Visit: Chest pain  Consultants: Cardiology  Procedures: Plan is for cardiac catheterization 3/27.  Antibiotics: None  Subjective/Interval History: Patient feels well. No further episodes of chest pain or shortness of breath. Denies any nausea, vomiting. His daughter is at the bedside.   ROS: Denies headaches  Objective:  Vital Signs  Vitals:   08/06/16 1535 08/06/16 2203 08/07/16 0023 08/07/16 0507  BP: (!) 112/55 139/64  (!) 145/62  Pulse: 73 77 74 65  Resp: 17 19 20 15   Temp: 98.9 F (37.2 C) 98.6 F (37 C)  98.5 F (36.9 C)  TempSrc: Oral     SpO2: 100% 96% 92% 95%  Weight:    105.9 kg (233 lb 6.4 oz)  Height:        Intake/Output Summary (Last 24 hours) at 08/07/16 8119 Last data filed at 08/07/16 0510  Gross per 24 hour  Intake              480 ml  Output             3600 ml  Net            -3120 ml   Filed Weights   08/06/16 0013 08/06/16 0507 08/07/16 0507  Weight: 113.2 kg (249 lb 9.6 oz) 113.4 kg (250 lb) 105.9 kg (233 lb 6.4 oz)    General appearance: alert, cooperative, appears stated age and no distress Resp: clear to auscultation bilaterally Cardio: regular rate and rhythm, S1, S2 normal, no murmur, click, rub or gallop GI: soft,  non-tender; bowel sounds normal; no masses,  no organomegaly Neurologic: Awake and alert. Cranial nerves II through XII intact. Motor strength equal bilateral upper and lower extremities.  Lab Results:  Data Reviewed: I have personally reviewed following labs and imaging studies  CBC:  Recent Labs Lab 08/05/16 1925 08/07/16 0526  WBC 10.4 8.6  NEUTROABS 7.1  --   HGB 9.6* 8.4*  HCT 31.9* 28.9*  MCV 94.4 95.4  PLT 259 220    Basic Metabolic Panel:  Recent Labs Lab 08/05/16 1925 08/07/16 0526  NA 136 137  K 3.7 3.9  CL 97* 98*  CO2 26 28  GLUCOSE 242* 232*  BUN 19 18  CREATININE 1.67* 1.57*  CALCIUM 8.6* 8.7*    GFR: Estimated Creatinine Clearance: 50.3 mL/min (A) (by C-G formula based on SCr of 1.57 mg/dL (H)).  Liver Function Tests:  Recent Labs Lab 08/05/16 1925  AST 26  ALT 16*  ALKPHOS 67  BILITOT 0.5  PROT 7.8  ALBUMIN 3.6    Coagulation Profile:  Recent Labs Lab 08/05/16 1925 08/06/16 0623 08/06/16 1821 08/07/16 0526  INR 2.16 2.15 1.96 1.86    Cardiac Enzymes:  Recent Labs Lab 08/06/16 0016 08/06/16 0623 08/06/16 1128  TROPONINI 0.03* 0.03* 0.03*   CBG:  Recent Labs Lab 08/06/16 0739 08/06/16 1106 08/06/16  1639 08/06/16 2209 08/07/16 0749  GLUCAP 230* 264* 232* 159* 234*     Recent Results (from the past 240 hour(s))  MRSA PCR Screening     Status: Abnormal   Collection Time: 08/06/16 12:20 AM  Result Value Ref Range Status   MRSA by PCR POSITIVE (A) NEGATIVE Final    Comment:        The GeneXpert MRSA Assay (FDA approved for NASAL specimens only), is one component of a comprehensive MRSA colonization surveillance program. It is not intended to diagnose MRSA infection nor to guide or monitor treatment for MRSA infections. RESULT CALLED TO, READ BACK BY AND VERIFIED WITH: HITT,L RN [phone] 08/06/16 MITCHELL,L       Radiology Studies: Dg Chest 2 View  Result Date: 08/05/2016 CLINICAL DATA:  Sudden onset of  mid sternal chest pain starting at 15:30 p.m. EXAM: CHEST  2 VIEW COMPARISON:  05/03/2013 FINDINGS: Cardiomediastinal silhouette is stable. Status post CABG. No infiltrate or pulmonary edema. Osteopenia and mild degenerative changes thoracic spine. IMPRESSION: No active cardiopulmonary disease. Status post CABG. Osteopenia and mild degenerative changes thoracic spine. Electronically Signed   By: Natasha Mead M.D.   On: 08/05/2016 19:04     Medications:  Scheduled: . allopurinol  100 mg Oral BID  . [START ON 08/08/2016] aspirin EC  81 mg Oral Daily  . Chlorhexidine Gluconate Cloth  6 each Topical Q0600  . enalapril  10 mg Oral Daily  . furosemide  40 mg Oral BID WC  . gabapentin  100 mg Oral TID  . hydrALAZINE  100 mg Oral BID  . insulin aspart  0-9 Units Subcutaneous TID WC  . insulin aspart protamine- aspart  50 Units Subcutaneous BID WC  . isosorbide mononitrate  60 mg Oral Daily  . metoprolol tartrate  50 mg Oral BID  . multivitamin with minerals  1 tablet Oral Daily  . mupirocin ointment  1 application Nasal BID  . pantoprazole  40 mg Oral BID AC  . pravastatin  40 mg Oral QHS  . QUEtiapine  50 mg Oral Q supper  . sodium chloride flush  3 mL Intravenous Q12H   Continuous: . sodium chloride 50 mL/hr at 08/07/16 0309  . heparin 1,400 Units/hr (08/06/16 2054)   RUE:AVWUJW chloride, acetaminophen, ondansetron (ZOFRAN) IV, sodium chloride flush  Assessment/Plan:  Principal Problem:   Unstable angina (HCC) Active Problems:   Chronic total occlusion of artery of the extremities (HCC)   Coronary artery disease   Essential hypertension   Hyperlipidemia   Type 2 diabetes mellitus with vascular disease (HCC)   Obstructive sleep apnea   CKD (chronic kidney disease), stage III   Normochromic normocytic anemia    Chest pain concerning for unstable angina Very minimal elevation in troponin. Patient remains chest pain-free. He is on heparin. Cardiology is following. Continue with beta  blocker, aspirin and statin. Plan is for cardiac catheterization today if INR less than 1.8. Discussed with the patient and family.   Diabetes mellitus type 2 Patient is on NovoLog 70/30 large doses at home. Patient's dose was reduced. Continue to monitor CBGs. SSI. Could go up on the dose of his insulin. We will wait until the cardiac catheterization has been completed.  Chronic atrial fibrillation Heart rate is stable. Patient is on metoprolol. Coumadin on hold for procedure and is placed on heparin.  Essential Hypertension  Monitor blood pressures. Continue home medications.   Obstructive sleep apnea  on CPAP.  History of diastolic CHF  Last EF measured was 50-55% with grade 1 diastolic dysfunction in 2014 - appears compensated. Diuretics and ACE inhibitor on hold. Echocardiogram is pending.  Chronic anemia probably from chronic disease Hemoglobin was at baseline when he was admitted. Some drop in hemoglobin noted today, which could be dilutional. No evidence for overt bleeding. Anemia could be due to chronic disease. Check anemia panel in the morning. Follow CBC.  Chronic kidney disease stage III Creatinine appears to be at baseline. Monitor urine output. Creatinine slightly better today. He was gently hydrated in preparations for his cardiac catheterization. Minimize contrast use with cardiac catheterization.  DVT Prophylaxis: On anticoagulation    Code Status: Full code  Family Communication: Discussed with the patient  Disposition Plan: Management as outlined above.    LOS: 1 day   Montpel[facility] Surgery CenterKRISHNAN,Ravleen Ries  [facility] Pager [phone] 08/07/2016, 8:19 AM  If 7PM-7AM, please contact night-coverage at [email or web address], password [email or web address]

## 2016-08-07 NOTE — H&P (View-Only) (Signed)
Progress Note  Patient Name: Isaiah [name] Date of Encounter: 08/07/2016  Primary Cardiologist: Dr. Allyson SabalBerry   Subjective   No complains. No chest pain of dyspnea.   Inpatient Medications    Scheduled Meds: . allopurinol  100 mg Oral BID  . [START ON 08/08/2016] aspirin EC  81 mg Oral Daily  . Chlorhexidine Gluconate Cloth  6 each Topical Q0600  . enalapril  10 mg Oral Daily  . furosemide  40 mg Oral BID WC  . gabapentin  100 mg Oral TID  . hydrALAZINE  100 mg Oral BID  . insulin aspart  0-9 Units Subcutaneous TID WC  . insulin aspart protamine- aspart  50 Units Subcutaneous BID WC  . isosorbide mononitrate  60 mg Oral Daily  . metoprolol tartrate  50 mg Oral BID  . multivitamin with minerals  1 tablet Oral Daily  . mupirocin ointment  1 application Nasal BID  . pantoprazole  40 mg Oral BID AC  . pravastatin  40 mg Oral QHS  . QUEtiapine  50 mg Oral Q supper  . sodium chloride flush  3 mL Intravenous Q12H   Continuous Infusions: . sodium chloride 50 mL/hr at 08/07/16 0309  . heparin 1,400 Units/hr (08/06/16 2054)   PRN Meds: sodium chloride, acetaminophen, ondansetron (ZOFRAN) IV, sodium chloride flush   Vital Signs    Vitals:   08/06/16 1535 08/06/16 2203 08/07/16 0023 08/07/16 0507  BP: (!) 112/55 139/64  (!) 145/62  Pulse: 73 77 74 65  Resp: 17 19 20 15   Temp: 98.9 F (37.2 C) 98.6 F (37 C)  98.5 F (36.9 C)  TempSrc: Oral     SpO2: 100% 96% 92% 95%  Weight:    233 lb 6.4 oz (105.9 kg)  Height:        Intake/Output Summary (Last 24 hours) at 08/07/16 0845 Last data filed at 08/07/16 0834  Gross per 24 hour  Intake              480 ml  Output             4250 ml  Net            -3770 ml   Filed Weights   08/06/16 0013 08/06/16 0507 08/07/16 0507  Weight: 249 lb 9.6 oz (113.2 kg) 250 lb (113.4 kg) 233 lb 6.4 oz (105.9 kg)    Telemetry    NSR with rare PVC, prolonged PR interval  - Personally Reviewed  ECG    None today  Physical Exam    GEN: No acute distress.  On CPAP Neck: No JVD + left carotid bruit Cardiac: RRR, no murmurs, rubs, or gallops. LE with absent pulses on right. 2+ left femoral and 1+ left DP pulse. Radial pulses 2+ bilateral Respiratory: Clear to auscultation bilaterally. GI: Soft, nontender, non-distended  MS: No edema; No deformity. Neuro:  Nonfocal  Psych: Normal affect   Labs    Chemistry Recent Labs Lab 08/05/16 1925 08/07/16 0526  NA 136 137  K 3.7 3.9  CL 97* 98*  CO2 26 28  GLUCOSE 242* 232*  BUN 19 18  CREATININE 1.67* 1.57*  CALCIUM 8.6* 8.7*  PROT 7.8  --   ALBUMIN 3.6  --   AST 26  --   ALT 16*  --   ALKPHOS 67  --   BILITOT 0.5  --   GFRNONAA 38* 41*  GFRAA 45* 48*  ANIONGAP 13 11  Hematology Recent Labs Lab 08/05/16 1925 08/07/16 0526  WBC 10.4 8.6  RBC 3.38* 3.03*  HGB 9.6* 8.4*  HCT 31.9* 28.9*  MCV 94.4 95.4  MCH 28.4 27.7  MCHC 30.1 29.1*  RDW 15.3 14.9  PLT 259 220    Cardiac Enzymes Recent Labs Lab 08/06/16 0016 08/06/16 0623 08/06/16 1128  TROPONINI 0.03* 0.03* 0.03*    Recent Labs Lab 08/05/16 1932  TROPIPOC 0.00     BNP Recent Labs Lab 08/05/16 1925  BNP 222.6*     DDimer No results for input(s): DDIMER in the last 168 hours.   Radiology    Dg Chest 2 View  Result Date: 08/05/2016 CLINICAL DATA:  Sudden onset of mid sternal chest pain starting at 15:30 p.m. EXAM: CHEST  2 VIEW COMPARISON:  05/03/2013 FINDINGS: Cardiomediastinal silhouette is stable. Status post CABG. No infiltrate or pulmonary edema. Osteopenia and mild degenerative changes thoracic spine. IMPRESSION: No active cardiopulmonary disease. Status post CABG. Osteopenia and mild degenerative changes thoracic spine. Electronically Signed   By: Natasha Mead M.D.   On: 08/05/2016 19:04    Cardiac Studies   Pending echo and cath today   Patient Profile     Isaiah [name] is a 76 y.o. male with a history of CAD s/p CABG in 1997, PAF on coumadin, HTN, HLD, DM, CVA,  OSA on CPAP, CKD stage III,  PAD and carotid artery disease presents for chest pain.    Assessment & Plan    1. Unstable angina/ possible NSTEMI. - troponin low flat trend. Coumadin on hold. On heparin now. INR of 1.86 this morning. Access via left radial or left femoral due to PAD and bypass grafts. No ventriculogram. Gently hydrated overnight. Scr stable. Hold  hold diuretics and ACEi for cath given CKD, resume tomorrow. Chest pain free. Continue ASA 81, Imdur, BB and statin. Will repeat INR at noon prior to cath. Pending echo.   2. PAF - Sinus rhythm on tele. Rate stable. CHADSVASc of 7. Anticoagulation as above.   3. PAD and carotid artery disease - Followed by [name].  4. HLD - Pending labs. Continue Pravastatin. If LDL not at goal, change to lipitor.   5. CKD, stage III - Scr stable  6. Anemia - Per primary   7. HTN - BP stable on current medications    Signed, Bhagat,Bhavinkumar, PA  08/07/2016, 8:45 AM    Patient seen and examined and history reviewed. Agree with above findings and plan. Patient is without symptoms today. Awaiting cardiac cath. INR today 1.86. Now on IV heparin. Will repeat INR today at noon. If < 1.8 will proceed with procedure. If still high may need to postpone until tomorrow.   Abel Hageman [place], MDFACC 08/07/2016 10:29 AM

## 2016-08-07 NOTE — Progress Notes (Signed)
ANTICOAGULATION CONSULT NOTE - Follow Up Consult  Pharmacy Consult for heparin Indication: chest pain/ACS and atrial fibrillation  Labs:  Recent Labs  08/05/16 1925 08/06/16 0016 08/06/16 0623 08/06/16 1128 08/06/16 1821 08/07/16 0526  HGB 9.6*  --   --   --   --  8.4*  HCT 31.9*  --   --   --   --  28.9*  PLT 259  --   --   --   --  220  LABPROT 24.4*  --  24.3*  --  22.6* 21.7*  INR 2.16  --  2.15  --  1.96 1.86  HEPARINUNFRC  --   --   --   --   --  <0.10*  CREATININE 1.67*  --   --   --   --  1.57*  TROPONINI  --  0.03* 0.03* 0.03*  --   --     Assessment: 76yo male undetectable on heparin with initial dosing while Coumadin on hold.  Goal of Therapy:  Heparin level 0.3-0.7 units/ml   Plan:  Will increase heparin gtt by 4 units/kg/hr to 1800 units/hr and check level if cath postponed.  Vernard GamblesVeronda Leldon Steege, PharmD, BCPS  08/07/2016,7:57 AM

## 2016-08-08 ENCOUNTER — Other Ambulatory Visit (HOSPITAL_COMMUNITY): Payer: Medicare Other

## 2016-08-08 ENCOUNTER — Encounter (HOSPITAL_COMMUNITY): Payer: Self-pay | Admitting: Interventional Cardiology

## 2016-08-08 DIAGNOSIS — I214 Non-ST elevation (NSTEMI) myocardial infarction: Secondary | ICD-10-CM | POA: Diagnosis present

## 2016-08-08 LAB — CBC
HCT: 29.8 % — ABNORMAL LOW (ref 39.0–52.0)
Hemoglobin: 8.7 g/dL — ABNORMAL LOW (ref 13.0–17.0)
MCH: 27.8 pg (ref 26.0–34.0)
MCHC: 29.2 g/dL — ABNORMAL LOW (ref 30.0–36.0)
MCV: 95.2 fL (ref 78.0–100.0)
Platelets: 248 10*3/uL (ref 150–400)
RBC: 3.13 MIL/uL — ABNORMAL LOW (ref 4.22–5.81)
RDW: 15 % (ref 11.5–15.5)
WBC: 10.8 10*3/uL — ABNORMAL HIGH (ref 4.0–10.5)

## 2016-08-08 LAB — IRON AND TIBC
Iron: 32 ug/dL — ABNORMAL LOW (ref 45–182)
Saturation Ratios: 7 % — ABNORMAL LOW (ref 17.9–39.5)
TIBC: 487 ug/dL — ABNORMAL HIGH (ref 250–450)
UIBC: 455 ug/dL

## 2016-08-08 LAB — GLUCOSE, CAPILLARY
Glucose-Capillary: 190 mg/dL — ABNORMAL HIGH (ref 65–99)
Glucose-Capillary: 231 mg/dL — ABNORMAL HIGH (ref 65–99)
Glucose-Capillary: 180 mg/dL — ABNORMAL HIGH (ref 65–99)

## 2016-08-08 LAB — BASIC METABOLIC PANEL
Anion gap: 9 (ref 5–15)
BUN: 16 mg/dL (ref 6–20)
CO2: 29 mmol/L (ref 22–32)
Calcium: 8.9 mg/dL (ref 8.9–10.3)
Chloride: 98 mmol/L — ABNORMAL LOW (ref 101–111)
Creatinine, Ser: 1.2 mg/dL (ref 0.61–1.24)
GFR calc Af Amer: >60 mL/min (ref >60)
GFR calc non Af Amer: 57 mL/min — ABNORMAL LOW (ref >60)
Glucose, Bld: 228 mg/dL — ABNORMAL HIGH (ref 65–99)
Potassium: 4 mmol/L (ref 3.5–5.1)
Sodium: 136 mmol/L (ref 135–145)

## 2016-08-08 LAB — VITAMIN B12: Vitamin B-12: 845 pg/mL (ref 180–914)

## 2016-08-08 LAB — RETICULOCYTES
RBC.: 3.13 MIL/uL — ABNORMAL LOW (ref 4.22–5.81)
Retic Count, Absolute: 109.6 10*3/uL (ref 19.0–186.0)
Retic Ct Pct: 3.5 % — ABNORMAL HIGH (ref 0.4–3.1)

## 2016-08-08 LAB — PROTIME-INR
INR: 1.56
Prothrombin Time: 18.8 seconds — ABNORMAL HIGH (ref 11.4–15.2)

## 2016-08-08 LAB — FOLATE: Folate: 35.5 ng/mL (ref >5.9)

## 2016-08-08 LAB — FERRITIN: Ferritin: 10 ng/mL — ABNORMAL LOW (ref 24–336)

## 2016-08-08 MED ORDER — ENALAPRIL MALEATE 10 MG PO TABS
10.0000 mg | ORAL_TABLET | Freq: Every day | ORAL | Status: DC
  Administered 2016-08-08 – 2016-08-09 (×2): 10 mg via ORAL
  Filled 2016-08-08 (×2): qty 1

## 2016-08-08 MED ORDER — FUROSEMIDE 40 MG PO TABS
40.0000 mg | ORAL_TABLET | Freq: Two times a day (BID) | ORAL | Status: DC
  Administered 2016-08-08 – 2016-08-09 (×4): 40 mg via ORAL
  Filled 2016-08-08 (×4): qty 1

## 2016-08-08 MED ORDER — ANGIOPLASTY BOOK
Freq: Once | Status: AC
  Administered 2016-08-08: 07:00:00 1
  Filled 2016-08-08: qty 1

## 2016-08-08 MED ORDER — WARFARIN SODIUM 5 MG PO TABS
5.0000 mg | ORAL_TABLET | Freq: Once | ORAL | Status: AC
  Administered 2016-08-08: 5 mg via ORAL
  Filled 2016-08-08: qty 1

## 2016-08-08 MED ORDER — WARFARIN - PHARMACIST DOSING INPATIENT
Freq: Every day | Status: DC
  Administered 2016-08-09: 19:00:00

## 2016-08-08 NOTE — Progress Notes (Signed)
PROGRESS NOTE        PATIENT DETAILS Name: Isaiah [name] Age: 76 y.o. Sex: male Date of Birth: Feb 08, 1941 Admit Date: 08/05/2016 Admitting Physician Eduard ClosArshad N Kakrakandy, MD ZOX:[id number],WUJWJPCP:JORDAN,[name]., MD  Brief Narrative: 76 year old male with a past medical history of coronary artery disease status post angioplasty in 1984, followed by CABG in 1997, chronic atrial fibrillation on warfarin, diabetes mellitus type 2, essential hypertension, sleep apnea, chronic kidney disease and anemia presented to the hospital with complaints of chest pain.He was evaluated by cardiology, underwent LHC with PCI to RCA on 3/27.  Subjective: Lying comfortably in bed. Denies any chest pain or shortness of breath.  Assessment/Plan: Non-STEMI: Seen by cardiology,underwent LHC with PCI to RCA on 3/27. Recommendations are to continue Plavix, Imdur and statin. Coumadin has been resumed with target INR of 2-2.5.  2:1 AV block with transient bradycardia: Occurred earlier this morning, beta blocker discontinued. Continue telemetry monitoring.  Paroxysmal atrial fibrillation: Mostly sinus rhythm and telemetry-Coumadin has been resumed, of metoprolol due to 2:1 AV block.  CHADSVASc of 7  Chronic diastolic CHF: Remains compensated. Continue Lasix and enalapril.   Diabetes mellitus type 2: CBGs relatively stable with insulin 70/3015 units twice a day. Follow and adjust accordingly.  Essential Hypertension: Moderate control-continue with Lasix, hydralazine, Imdur and enalapril.  Dyslipidemia: Continue statin  Obstructive sleep apnea:on CPAP. May need to assess for home oxygen prior to discharge.  Normocytic anemia probably from chronic disease: Mild drop in hemoglobin compared to baseline likely secondary to acute illness. No indication to transfuse. Follow periodically.  Chronic kidney disease stage XBJ:YNWGNFAOZHII:Creatinine appears to be at baseline. Follow periodically.  DVT Prophylaxis: Full  dose anticoagulation with Coumadin  Code Status: Full code   Family Communication: None at bedside  Disposition Plan: Remain inpatient-home when cleared by cardiolgy  Antimicrobial agents: Anti-infectives    None     Procedures: LHC 3/27>>  Ost Cx to Prox Cx lesion, 100 %stenosed. SVG to OM patent, with Y graft SVG to diagonal originating from mid portion of the OM graft. Moderate disease in the origin of the Y graft portion to the diagonal.  Mid LAD lesion, 70 %stenosed. Dist LAD lesion, 100 %stenosed. LIMA to LAD is widely patent.  Ost RCA to Prox RCA lesion, 80 %stenosed. A STENT XIENCE ALPINE RX [id number] drug eluting stent was successfully placed.  Post intervention, there is a 0% residual stenosis.  LV end diastolic pressure is moderately elevated.  There is no aortic valve stenosis.  CONSULTS:  cardiology  Time spent: 25- minutes-Greater than 50% of this time was spent in counseling, explanation of diagnosis, planning of further management, and coordination of care.  MEDICATIONS: Scheduled Meds: . allopurinol  100 mg Oral BID  . aspirin  81 mg Oral Daily  . Chlorhexidine Gluconate Cloth  6 each Topical Q0600  . clopidogrel  75 mg Oral Q breakfast  . docusate sodium  100 mg Oral BID  . enalapril  10 mg Oral Daily  . furosemide  40 mg Oral BID  . gabapentin  100 mg Oral TID  . hydrALAZINE  100 mg Oral BID  . insulin aspart  0-9 Units Subcutaneous TID WC  . insulin aspart protamine- aspart  50 Units Subcutaneous BID WC  . isosorbide mononitrate  60 mg Oral Daily  . multivitamin with minerals  1 tablet Oral Daily  .  mupirocin ointment  1 application Nasal BID  . pantoprazole  40 mg Oral BID AC  . pravastatin  40 mg Oral QHS  . QUEtiapine  50 mg Oral Q supper  . sodium chloride flush  3 mL Intravenous Q12H  . warfarin  5 mg Oral ONCE-1800  . Warfarin - Pharmacist Dosing Inpatient   Does not apply q1800   Continuous Infusions: PRN Meds:.sodium chloride,  acetaminophen, ondansetron (ZOFRAN) IV, sodium chloride flush   PHYSICAL EXAM: Vital signs: Vitals:   08/08/16 0700 08/08/16 0840 08/08/16 0932 08/08/16 1142  BP: (!) 148/48  (!) 158/78 (!) 165/50  Pulse: 79   84  Resp: 15   (!) 25  Temp:  98.2 F (36.8 C)  97.4 F (36.3 C)  TempSrc:  Oral  Oral  SpO2: 93%   95%  Weight:      Height:       Filed Weights   08/06/16 0507 08/07/16 0507 08/08/16 0600  Weight: 113.4 kg (250 lb) 105.9 kg (233 lb 6.4 oz) 106.5 kg (234 lb 12.6 oz)   Body mass index is 32.75 kg/m.   General appearance :Awake, alert, not in any distress. Eyes:, pupils equally reactive to light and accomodation,no scleral icterus. HEENT: Atraumatic and Normocephalic Neck: supple, no JVD. No cervical lymphadenopathy Resp:Good air entry bilaterally, no added sounds  CVS: S1 S2 regular GI: Bowel sounds present, Non tender and not distended with no gaurding, rigidity or rebound. Extremities: B/L Lower Ext shows no edema, both legs are warm to touch Neurology:  speech clear,Non focal, sensation is grossly intact. Psychiatric: Normal judgment and insight. Alert and oriented x 3.  Musculoskeletal:No digital cyanosis Skin:No Rash, warm and dry Wounds:N/A  I have personally reviewed following labs and imaging studies  LABORATORY DATA: CBC:  Recent Labs Lab 08/05/16 1925 08/07/16 0526 08/08/16 0144  WBC 10.4 8.6 10.8*  NEUTROABS 7.1  --   --   HGB 9.6* 8.4* 8.7*  HCT 31.9* 28.9* 29.8*  MCV 94.4 95.4 95.2  PLT 259 220 248    Basic Metabolic Panel:  Recent Labs Lab 08/05/16 1925 08/07/16 0526 08/08/16 0144  NA 136 137 136  K 3.7 3.9 4.0  CL 97* 98* 98*  CO2 26 28 29   GLUCOSE 242* 232* 228*  BUN 19 18 16   CREATININE 1.67* 1.57* 1.20  CALCIUM 8.6* 8.7* 8.9    GFR: Estimated Creatinine Clearance: 66.1 mL/min (by C-G formula based on SCr of 1.2 mg/dL).  Liver Function Tests:  Recent Labs Lab 08/05/16 1925  AST 26  ALT 16*  ALKPHOS 67  BILITOT  0.5  PROT 7.8  ALBUMIN 3.6   No results for input(s): LIPASE, AMYLASE in the last 168 hours. No results for input(s): AMMONIA in the last 168 hours.  Coagulation Profile:  Recent Labs Lab 08/06/16 0623 08/06/16 1821 08/07/16 0526 08/07/16 1054 08/08/16 0144  INR 2.15 1.96 1.86 1.63 1.56    Cardiac Enzymes:  Recent Labs Lab 08/06/16 0016 08/06/16 0623 08/06/16 1128  TROPONINI 0.03* 0.03* 0.03*    BNP (last 3 results) No results for input(s): PROBNP in the last 8760 hours.  HbA1C: No results for input(s): HGBA1C in the last 72 hours.  CBG:  Recent Labs Lab 08/07/16 1132 08/07/16 1649 08/07/16 2141 08/08/16 0608 08/08/16 1132  GLUCAP 229* 205* 169* 190* 231*    Lipid Profile:  Recent Labs  08/07/16 1054  CHOL 79  HDL 26*  LDLCALC 22  TRIG 161*  CHOLHDL 3.0  Thyroid Function Tests: No results for input(s): TSH, T4TOTAL, FREET4, T3FREE, THYROIDAB in the last 72 hours.  Anemia Panel:  Recent Labs  08/08/16 0144  VITAMINB12 845  FOLATE 35.5  FERRITIN 10*  TIBC 487*  IRON 32*  RETICCTPCT 3.5*    Urine analysis: No results found for: COLORURINE, APPEARANCEUR, LABSPEC, PHURINE, GLUCOSEU, HGBUR, BILIRUBINUR, KETONESUR, PROTEINUR, UROBILINOGEN, NITRITE, LEUKOCYTESUR  Sepsis Labs: Lactic Acid, Venous No results found for: LATICACIDVEN  MICROBIOLOGY: Recent Results (from the past 240 hour(s))  MRSA PCR Screening     Status: Abnormal   Collection Time: 08/06/16 12:20 AM  Result Value Ref Range Status   MRSA by PCR POSITIVE (A) NEGATIVE Final    Comment:        The GeneXpert MRSA Assay (FDA approved for NASAL specimens only), is one component of a comprehensive MRSA colonization surveillance program. It is not intended to diagnose MRSA infection nor to guide or monitor treatment for MRSA infections. RESULT CALLED TO, READ BACK BY AND VERIFIED WITH: HITT,L RN 0324 08/06/16 MITCHELL,L     RADIOLOGY STUDIES/RESULTS: Dg Chest 2  View  Result Date: 08/05/2016 CLINICAL DATA:  Sudden onset of mid sternal chest pain starting at 15:30 p.m. EXAM: CHEST  2 VIEW COMPARISON:  05/03/2013 FINDINGS: Cardiomediastinal silhouette is stable. Status post CABG. No infiltrate or pulmonary edema. Osteopenia and mild degenerative changes thoracic spine. IMPRESSION: No active cardiopulmonary disease. Status post CABG. Osteopenia and mild degenerative changes thoracic spine. Electronically Signed   By: Natasha Mead M.D.   On: 08/05/2016 19:04     LOS: 2 days   Jeoffrey Massed, MD  [facility] Pager:[phone]  If 7PM-7AM, please contact night-coverage [email or web address] Password [email or web address] 08/08/2016, 2:18 PM

## 2016-08-08 NOTE — Progress Notes (Signed)
CARDIAC REHAB PHASE I   PRE:  Rate/Rhythm: 75 afib/ first degree, brief episode Second deg    BP: sitting 164/74    SaO2: 96 3L, 89 RA  MODE:  Ambulation: 84 ft   POST:  Rate/Rhythm: 88 first degree/afib    BP: sitting 174/54     SaO2: 92 2L   Fairly steady with RW, 2L, gait belt, assist x1. Became DOE at 40 ft. Had pt rest (he was going to keep going). SaO2 92 2L. Turned around and walked back to room, to EOB. Significant SOB with ambulation. Pt not very aware of his limits. To BR. Only brief second degree HB, asx. Rhythm difficult to read (artifact, ? Small p wave).  [id number]  Isaiah MassonRandi [name] Isaiah [name] CES, ACSM 08/08/2016 10:09 AM

## 2016-08-08 NOTE — Progress Notes (Signed)
Pt. placed on CPAP for h/s, tolerating well. 

## 2016-08-08 NOTE — Progress Notes (Signed)
ANTICOAGULATION CONSULT NOTE  Pharmacy Consult for coumadin Indication: atrial fibrillation  No Known Allergies  Patient Measurements: Height: 5\' 11"  (180.3 cm) Weight: 234 lb 12.6 oz (106.5 kg) IBW/kg (Calculated) : 75.3 Heparin Dosing Weight: 100 kg  Vital Signs: Temp: 98 F (36.7 C) (03/28 0600) Temp Source: Axillary (03/28 0600) BP: 148/48 (03/28 0700) Pulse Rate: 79 (03/28 0700)  Labs:  Recent Labs  08/05/16 1925 08/06/16 0016 08/06/16 0623 08/06/16 1128  08/07/16 0526 08/07/16 1054 08/08/16 0144  HGB 9.6*  --   --   --   --  8.4*  --  8.7*  HCT 31.9*  --   --   --   --  28.9*  --  29.8*  PLT 259  --   --   --   --  220  --  248  LABPROT 24.4*  --  24.3*  --   < > 21.7* 19.5* 18.8*  INR 2.16  --  2.15  --   < > 1.86 1.63 1.56  HEPARINUNFRC  --   --   --   --   --  <0.10*  --   --   CREATININE 1.67*  --   --   --   --  1.57*  --  1.20  TROPONINI  --  0.03* 0.03* 0.03*  --   --   --   --   < > = values in this interval not displayed.  Estimated Creatinine Clearance: 66.1 mL/min (by C-G formula based on SCr of 1.2 mg/dL).  Assessment: 76 y.o.malewith a history of CAD s/p CABG in 1997, PAF on coumadin, HTN, HLD, DM, CVA, OSA on CPAP, CKD stage III,  PAD and carotid artery disease presents for chest pain.   Patient s/p PCI to RCA. Heparin held for cath, new orders to restart coumadin for his afib. INR stabilized at 1.56 this am.   Home dose of warfarin was 4mg  daily.   Goal of Therapy:  INR goal 2-2.5 Monitor platelets by anticoagulation protocol: Yes   Plan:  Warfarin 5mg  tonight Watch INR/cbc closely as patient continues on asa/plavix until discharge then likely stop asa.   Sheppard CoilFrank Keyuna Cuthrell PharmD., BCPS Clinical Pharmacist Pager [phone] 08/08/2016 8:46 AM

## 2016-08-08 NOTE — Progress Notes (Addendum)
Progress Note  Patient Name: Isaiah [name] Date of Encounter: 08/08/2016  Primary Cardiologist: Dr. Allyson Sabal   Subjective   No complaints. No chest pain or dyspnea.   Inpatient Medications    Scheduled Meds: . allopurinol  100 mg Oral BID  . aspirin  81 mg Oral Daily  . Chlorhexidine Gluconate Cloth  6 each Topical Q0600  . clopidogrel  75 mg Oral Q breakfast  . docusate sodium  100 mg Oral BID  . enalapril  10 mg Oral Daily  . furosemide  40 mg Oral BID  . gabapentin  100 mg Oral TID  . hydrALAZINE  100 mg Oral BID  . insulin aspart  0-9 Units Subcutaneous TID WC  . insulin aspart protamine- aspart  50 Units Subcutaneous BID WC  . isosorbide mononitrate  60 mg Oral Daily  . metoprolol tartrate  50 mg Oral BID  . multivitamin with minerals  1 tablet Oral Daily  . mupirocin ointment  1 application Nasal BID  . pantoprazole  40 mg Oral BID AC  . pravastatin  40 mg Oral QHS  . QUEtiapine  50 mg Oral Q supper  . sodium chloride flush  3 mL Intravenous Q12H   Continuous Infusions:  PRN Meds: sodium chloride, acetaminophen, ondansetron (ZOFRAN) IV, sodium chloride flush   Vital Signs    Vitals:   08/08/16 0013 08/08/16 0225 08/08/16 0600 08/08/16 0638  BP:  (!) 152/47  (!) 164/60  Pulse: 83 81 76   Resp: 18 19 (!) 27   Temp:  97.9 F (36.6 C) 98 F (36.7 C)   TempSrc:  Oral Axillary   SpO2: 98% 96% 95%   Weight:   234 lb 12.6 oz (106.5 kg)   Height:        Intake/Output Summary (Last 24 hours) at 08/08/16 0757 Last data filed at 08/08/16 0027  Gross per 24 hour  Intake              343 ml  Output             1650 ml  Net            -1307 ml   Filed Weights   08/06/16 0507 08/07/16 0507 08/08/16 0600  Weight: 250 lb (113.4 kg) 233 lb 6.4 oz (105.9 kg) 234 lb 12.6 oz (106.5 kg)    Telemetry    NSR , prolonged PR interval. Later this am developed 2:1 AV block with HR down to 38. No symptoms.  - Personally Reviewed  ECG    NSR with first degree AV  block. Otherwise normal.  Physical Exam   GEN: No acute distress.   Neck: Mild  JVD + left carotid bruit Cardiac: RRR, no murmurs, rubs, or gallops. LE with absent pulses on right. 2+ left femoral and 1+ left DP pulse. Radial pulses 2+ bilateral, no left radial site hematoma. Respiratory: Clear to auscultation bilaterally. GI: Soft, nontender, non-distended  MS: No edema; No deformity. Neuro:  Nonfocal  Psych: Normal affect   Labs    Chemistry  Recent Labs Lab 08/05/16 1925 08/07/16 0526 08/08/16 0144  NA 136 137 136  K 3.7 3.9 4.0  CL 97* 98* 98*  CO2 26 28 29   GLUCOSE 242* 232* 228*  BUN 19 18 16   CREATININE 1.67* 1.57* 1.20  CALCIUM 8.6* 8.7* 8.9  PROT 7.8  --   --   ALBUMIN 3.6  --   --   AST 26  --   --  ALT 16*  --   --   ALKPHOS 67  --   --   BILITOT 0.5  --   --   GFRNONAA 38* 41* 57*  GFRAA 45* 48* >60  ANIONGAP 13 11 9      Hematology  Recent Labs Lab 08/05/16 1925 08/07/16 0526 08/08/16 0144  WBC 10.4 8.6 10.8*  RBC 3.38* 3.03* 3.13*  3.13*  HGB 9.6* 8.4* 8.7*  HCT 31.9* 28.9* 29.8*  MCV 94.4 95.4 95.2  MCH 28.4 27.7 27.8  MCHC 30.1 29.1* 29.2*  RDW 15.3 14.9 15.0  PLT 259 220 248    Cardiac Enzymes  Recent Labs Lab 08/06/16 0016 08/06/16 0623 08/06/16 1128  TROPONINI 0.03* 0.03* 0.03*     Recent Labs Lab 08/05/16 1932  TROPIPOC 0.00     BNP  Recent Labs Lab 08/05/16 1925  BNP 222.6*     DDimer No results for input(s): DDIMER in the last 168 hours.   Radiology    CHEST  2 VIEW  COMPARISON:  05/03/2013  FINDINGS: Cardiomediastinal silhouette is stable. Status post CABG. No infiltrate or pulmonary edema. Osteopenia and mild degenerative changes thoracic spine.  IMPRESSION: No active cardiopulmonary disease. Status post CABG. Osteopenia and mild degenerative changes thoracic spine.   Electronically Signed   By: Natasha Mead M.D.   On: 08/05/2016 19:04   Cardiac Studies   Procedures   Coronary  Stent Intervention  Left Heart Cath and Cors/Grafts Angiography  Conclusion     Ost Cx to Prox Cx lesion, 100 %stenosed. SVG to OM patent, with Y graft SVG to diagonal originating from mid portion of the OM graft. Moderate disease in the origin of the Y graft portion to the diagonal.  Mid LAD lesion, 70 %stenosed. Dist LAD lesion, 100 %stenosed. LIMA to LAD is widely patent.  Ost RCA to Prox RCA lesion, 80 %stenosed. A STENT XIENCE ALPINE RX 4.0X23 drug eluting stent was successfully placed.  Post intervention, there is a 0% residual stenosis.  LV end diastolic pressure is moderately elevated.  There is no aortic valve stenosis.   Continue clopidogrel for at least a year.  Will likely be on a combination of Coumadin and clopidogrel going forward.  Continue aggressive secondary prevention.  Post cath hydration limited by increased LVEDP.  Anemia also puts him at risk for CIN.  Follow renal function closely.  Hold enalapril for now.    Indications   Unstable angina (HCC) [I20.0 (ICD-10-CM)]  Procedural Details/Technique   Technical Details The risks, benefits, and details of the procedure were explained to the patient. The patient verbalized understanding and wanted to proceed. Informed written consent was obtained.  PROCEDURE TECHNIQUE: After Xylocaine anesthesia a 25F slender sheath was placed in the left radial artery with a single anterior needle wall stick. IV Heparin was given. Right coronary angiography was done using a Judkins R4 guide catheter. Left coronary angiography was done using a Judkins L4 guide catheter. SVG was engaged with an LCB catheter. Left heart cath was done using a JR4 catheter. A TR band was used for hemostasis.  Contrast: 175 cc  Multiple catheter exchanges.  IV angiomax used for PCI. Prowater wide was used. 2.5 balloon was used to predilate. A 4.0 x 23 Xience drug-eluting stent was advanced but would not cross. A BMW wire was then placed for support.  The stent still would not cross. A 3.5 balloon was then used to further predilate before the stent. The 4.0 stent was advanced but again  would not cross. At that point, the guide catheter was changed to extra backup RCA catheter. The pro-water wire was placed again across the area disease. The stent was advanced without difficulty into position. It was used to cover the ostium as well. Stent was deployed at high pressure. The ostium of the stent was flared with the stent balloon. There was no residual stenosis.   Estimated blood loss between 50 mL and 150 mL.  During this procedure the patient was administered the following to achieve and maintain moderate conscious sedation: Versed 1 mg, Fentanyl 50 mcg, while the patient's heart rate, blood pressure, and oxygen saturation were continuously monitored. The period of conscious sedation was 80 minutes, of which I was present face-to-face 100% of this time.    Complications   Complications documented before study signed (08/07/2016 4:46 PM EDT)    No complications were associated with this study.  Documented by Corky Crafts, MD - 08/07/2016 4:39 PM EDT    Coronary Findings   Dominance: Right  Left Anterior Descending  Mid LAD lesion, 70% stenosed. The lesion was previously treated.  Dist LAD lesion, 100% stenosed.  Left Circumflex  Ost Cx to Prox Cx lesion, 100% stenosed.  Right Coronary Artery  Ost RCA to Prox RCA lesion, 80% stenosed. Culprit lesion. The lesion is eccentric and ulcerative.  Angioplasty: Lesion crossed with guidewire using a WIRE ASAHI PROWATER 180CM. Pre-stent angioplasty was performed using a BALLOON EUPHORA RX3.5X12. A STENT XIENCE ALPINE RX 4.0X23 drug eluting stent was successfully placed. Stent strut is well apposed. Post-stent angioplasty was not performed. The pre-interventional distal flow is normal (TIMI 3). The post-interventional distal flow is normal (TIMI 3). The intervention was successful .  There is no  residual stenosis post intervention.  Graft Angiography  saphenous Graft to 1st Mrg  SVG.  Graft to 2nd Diag  Origin lesion, 60% stenosed.  LIMA Graft to Dist LAD  Left Heart   Left Ventricle LV end diastolic pressure is moderately elevated.    Aortic Valve There is no aortic valve stenosis.    Coronary Diagrams   Diagnostic Diagram       Post-Intervention Diagram          Patient Profile     SHAFER SWAMY is a 76 y.o. male with a history of CAD s/p CABG in 1997, PAF on coumadin, HTN, HLD, DM, CVA, OSA on CPAP, CKD stage III,  PAD and carotid artery disease presents for chest pain.    Assessment & Plan    1.  NSTEMI- type 1- confirmed- troponin low flat trend. Ecg without acute change. - cath findings noted. Patent bypass grafts but 80% ostial/ proximal RCA stenosis. s/p successful PCI with DES.  - Continue  Imdur, and statin. May resume Coumadin- target INR 2-2.5. On Plavix. I would stop ASA at discharge given increased risk of bleeding and Fe deficiency anemia.  - Echo is pending today to assess LV function. EDP elevated on cath. Will resume lasix and enalapril.   2. PAF - Sinus rhythm on tele with long first degree AV block (second degree Mobitz type 1. Then developed transient 2:1 AV block. Will need to hold metoprolol and observe.  CHADSVASc of 7. Anticoagulation as above.   3. PAD and carotid artery disease - Followed by [name].  4. HLD - LDL 22.  Continue Pravastatin.   5. CKD, stage III - Scr stable- improved to 1.2. Will resume Lasix at 40 mg bid and Vasotec  10 mg daily  6. Anemia - studies consistent with Fe deficiency anemia. Per primary care. Needs iron replacement.  7. HTN - BP stable on current medications  8. OSA. On CPAP at home. Desaturation noted now. Will need to monitor as he ambulates. May need home oxygen.   Will await Echo results. Ambulate in halls with Cardiac Rehab. Will need to monitor on telemetry another 24 hours due to AV  block- beta blocker on hold.     Signed,  Finlee Milo SwazilandJordan, MDFACC 08/08/2016 7:57 AM

## 2016-08-08 NOTE — Care Management Note (Addendum)
Case Management Note  Patient Details  Name: Isaiah [name] MRN: [id number] Date of Birth: 1940-11-14  Subjective/Objective:    Pt presented for Chest Pian. Pt is from home with daughter and her husband. Pt has DME: RW and CPAP at home. Pt is on 02 @ 3L and Staff RN trying to wean. PT will benefit from PT/OT consult  3/28 1236 Letha Capeeborah Paytience Bures RN, BSN - s/p coronary stent intervention,  Will be on plavix and asa, await pt/ot eval.  NCM will cont to follow for dc needs.   3/29 1232 Letha Capeeborah Camron Monday RN, BSN- [facility] dc today, [facility] , [facility].  He has medication coverage , daughter , Otila BackDonna Garrett will be transporting him home today.  [facility] , received call back and Lupita LeashDonna state [facility] [place] [facility], [facility], referral made .  soc will begin on next Tuesday post dc and daughter states this is ok with her, they would also like to have a wheelchair.  He does not qualify for home oxygen.                  Action/Plan:   Expected Discharge Date:                  Expected Discharge Plan:  Home w Home Health Services  In-House Referral:  NA  Discharge planning Services  CM Consult  Post Acute Care Choice:    Choice offered to:     DME Arranged:    DME Agency:     HH Arranged:    HH Agency:     Status of Service:  In process, will continue to follow  If discussed at Long Length of Stay Meetings, dates discussed:    Additional Comments:  Leone Havenaylor, Kaylynn Chamblin Clinton, RN 08/08/2016, 12:36 PM

## 2016-08-08 NOTE — Progress Notes (Signed)
TR BAND REMOVAL  LOCATION:  R radial  DEFLATED PER PROTOCOL:    Yes  TIME BAND OFF / DRESSING APPLIED:    2135 Yes  SITE UPON ARRIVAL:    Level 0   SITE AFTER BAND REMOVAL:    Level 0  CIRCULATION SENSATION AND MOVEMENT:    Within Normal Limits   Yes  COMMENTS:  Pt tolerated procedure well VSS

## 2016-08-09 ENCOUNTER — Inpatient Hospital Stay (HOSPITAL_COMMUNITY): Payer: Medicare Other

## 2016-08-09 DIAGNOSIS — R079 Chest pain, unspecified: Secondary | ICD-10-CM

## 2016-08-09 DIAGNOSIS — I214 Non-ST elevation (NSTEMI) myocardial infarction: Principal | ICD-10-CM

## 2016-08-09 LAB — GLUCOSE, CAPILLARY
Glucose-Capillary: 186 mg/dL — ABNORMAL HIGH (ref 65–99)
Glucose-Capillary: 207 mg/dL — ABNORMAL HIGH (ref 65–99)
Glucose-Capillary: 226 mg/dL — ABNORMAL HIGH (ref 65–99)
Glucose-Capillary: 221 mg/dL — ABNORMAL HIGH (ref 65–99)

## 2016-08-09 LAB — ECHOCARDIOGRAM COMPLETE
Height: 71 in
Weight: 3756.64 oz

## 2016-08-09 LAB — CBC
HCT: 27.4 % — ABNORMAL LOW (ref 39.0–52.0)
Hemoglobin: 7.9 g/dL — ABNORMAL LOW (ref 13.0–17.0)
MCH: 27.1 pg (ref 26.0–34.0)
MCHC: 28.8 g/dL — ABNORMAL LOW (ref 30.0–36.0)
MCV: 93.8 fL (ref 78.0–100.0)
Platelets: 229 10*3/uL (ref 150–400)
RBC: 2.92 MIL/uL — ABNORMAL LOW (ref 4.22–5.81)
RDW: 15 % (ref 11.5–15.5)
WBC: 10.4 10*3/uL (ref 4.0–10.5)

## 2016-08-09 LAB — PROTIME-INR
INR: 1.32
Prothrombin Time: 16.5 seconds — ABNORMAL HIGH (ref 11.4–15.2)

## 2016-08-09 MED ORDER — WARFARIN SODIUM 6 MG PO TABS
6.0000 mg | ORAL_TABLET | Freq: Once | ORAL | Status: AC
Start: 2016-08-09 — End: 2016-08-09
  Administered 2016-08-09: 19:00:00 6 mg via ORAL
  Filled 2016-08-09: qty 1

## 2016-08-09 MED ORDER — PERFLUTREN LIPID MICROSPHERE
1.0000 mL | INTRAVENOUS | Status: AC | PRN
  Administered 2016-08-09: 11:00:00 2 mL via INTRAVENOUS
  Filled 2016-08-09: qty 10

## 2016-08-09 MED ORDER — FERROUS SULFATE 325 (65 FE) MG PO TABS: 325.0000 mg | ORAL_TABLET | Freq: Two times a day (BID) | ORAL | 0 refills | Status: AC

## 2016-08-09 MED ORDER — CLOPIDOGREL BISULFATE 75 MG PO TABS: 75.0000 mg | ORAL_TABLET | Freq: Every day | ORAL | 0 refills | Status: DC

## 2016-08-09 NOTE — Progress Notes (Signed)
Came to ambulate however PT needs to do eval so held ambulation for them to follow. Discussed stent, Plavix/Coumadin, HF, daily wts, low sodium, activity as tolerated, NTG and CRPII with pt. Voiced understanding and requests [facility] however pt does prefer to walk in his above the ground pool instead.  [id number] Ethelda ChickKristan Amirra Herling CES, ACSM 9:01 AM 08/09/2016

## 2016-08-09 NOTE — Evaluation (Signed)
Physical Therapy Evaluation Patient Details Name: Isaiah [name] MRN: [id number] DOB: July 27, 1940 Today's Date: 08/09/2016   History of Present Illness  Pt is 76 y/o male admitted secondary to chest pain; s/p LHC and PCI to RCA on 3/27. PMH includes CABG in 1997, afib, DM, HTN, sleep apnea, and CKD.   Clinical Impression  Pt s/p procedure above with deficits below. PTA, pt was independent with all functional mobility. Upon evaluation, pt limited in gait tolerance secondary to muscle fatigue, weakness and decreased activity tolerance. Gait training on RA this session and oxygen sats remained from 90%-93% on RA. Completed oxygen qualification note. Recommending HHPT at d/c to increase independence and safety with functional mobility. Will continue to follow.      Follow Up Recommendations Home health PT;Supervision/Assistance - 24 hour    Equipment Recommendations  None recommended by PT    Recommendations for Other Services       Precautions / Restrictions Precautions Precautions: Other (comment) Precaution Comments: monitor Oxygen sats  Restrictions Weight Bearing Restrictions: No      Mobility  Bed Mobility               General bed mobility comments: In chair upon entry   Transfers Overall transfer level: Needs assistance Equipment used: Rolling walker (2 wheeled) Transfers: Sit to/from Stand Sit to Stand: Min guard         General transfer comment: Min guard for safety. Verbal cues for proper hand placement during transfer.   Ambulation/Gait Ambulation/Gait assistance: Min guard Ambulation Distance (Feet): 75 Feet Assistive device: Rolling walker (2 wheeled) Gait Pattern/deviations: Step-through pattern;Drifts right/left;Decreased stride length;Trunk flexed Gait velocity: Decreased Gait velocity interpretation: Below normal speed for age/gender General Gait Details: Pt demonstrating SOB during ambulation and distance limited by fatigue. Oxygen sats monitored  throughout gait and remained between 90%-93% on room air. Oxygen qualification note entered. Pt limited in ambulation distance secondary to fatigue. Verbal cues for upright posture throughout gait. Pt requesting to reapply oxygen at end of gait training, however, oxygen sats at 9% on RA. Reapplied and oxygen sats increased to 96% on 2L.   Stairs            Wheelchair Mobility    Modified Rankin (Stroke Patients Only)       Balance Overall balance assessment: Needs assistance Sitting-balance support: No upper extremity supported;Feet supported Sitting balance-Leahy Scale: Fair     Standing balance support: Bilateral upper extremity supported;During functional activity Standing balance-Leahy Scale: Poor Standing balance comment: Reliant on RW for balance                              Pertinent Vitals/Pain Pain Assessment: No/denies pain    Home Living Family/patient expects to be discharged to:: Private residence Living Arrangements: Children;Other relatives (daughter and her husband ) Available Help at Discharge: Family;Available 24 hours/day Type of Home: House Home Access: Level entry     Home Layout: Two level;Able to live on main level with bedroom/bathroom Home Equipment: Dan Humphreys - 2 wheels;Wheelchair - [employer] - 4 wheels;Bedside commode;Shower seat Additional Comments: Daughter available 24/7. Discussed with pt about staying on first level to conserve energy at d/c. Pt agreeable to recommendations.     Prior Function Level of Independence: Independent               Hand Dominance   Dominant Hand: Right    Extremity/Trunk Assessment   Upper Extremity Assessment Upper Extremity  Assessment: Overall WFL for tasks assessed    Lower Extremity Assessment Lower Extremity Assessment: Generalized weakness (Grossly 3+/5 throughout BLE. muscle fatigue during gait. )    Cervical / Trunk Assessment Cervical / Trunk Assessment: Kyphotic   Communication   Communication: HOH  Cognition Arousal/Alertness: Awake/alert Behavior During Therapy: WFL for tasks assessed/performed Overall Cognitive Status: Within Functional Limits for tasks assessed                                        General Comments General comments (skin integrity, edema, etc.): Educated pt about energy conservation techniques at home such as importance of seated rest breaks, sitting during showering, activity pacing, etc. Educated pt about HHPT recommendations and pt agreeable. Educated to use RW during ambulation at home.     Exercises     Assessment/Plan    PT Assessment Patient needs continued PT services  PT Problem List Decreased strength;Decreased activity tolerance;Decreased balance;Decreased mobility;Decreased knowledge of use of DME;Decreased knowledge of precautions       PT Treatment Interventions DME instruction;Gait training;Functional mobility training;Therapeutic activities;Therapeutic exercise;Balance training;Neuromuscular re-education;Patient/family education    PT Goals (Current goals can be found in the Care Plan section)  Acute Rehab PT Goals Patient Stated Goal: to go home  PT Goal Formulation: With patient Time For Goal Achievement: 08/16/16 Potential to Achieve Goals: Good    Frequency Min 3X/week   Barriers to discharge        Co-evaluation               End of Session Equipment Utilized During Treatment: Gait belt Activity Tolerance: Patient limited by fatigue Patient left: in chair;with call bell/phone within reach Nurse Communication: Mobility status PT Visit Diagnosis: Other abnormalities of gait and mobility (R26.89);Muscle weakness (generalized) (M62.81)    Time: [id number] PT Time Calculation (min) (ACUTE ONLY): 20 min   Charges:   PT Evaluation $PT Eval Low Complexity: 1 Procedure PT Treatments $Gait Training: 8-22 mins   PT G Codes:        Margot ChimesBrittany Smith, PT, DPT  Acute  Rehabilitation Services  Pager: [phone]   [name] 08/09/2016, 9:39 AM

## 2016-08-09 NOTE — Progress Notes (Signed)
  Echocardiogram 2D Echocardiogram with definity has been performed.  Leta JunglingCooper, Elvena Oyer M 08/09/2016, 11:05 AM

## 2016-08-09 NOTE — Progress Notes (Signed)
PT NOTE  SATURATION QUALIFICATIONS: (This note is used to comply with regulatory documentation for home oxygen)  Patient Saturations on Room Air at Rest = 92%  Patient Saturations on Room Air while Ambulating = 90%   Please briefly explain why patient needs home oxygen: Pt sats remained at 90%-93% on RA during ambulation. Pt requesting oxygen to be replaced at end of session and oxygen sats at 96% on 2L.   Margot ChimesBrittany Smith, PT, DPT  Acute Rehabilitation Services  Pager: [phone]

## 2016-08-09 NOTE — Care Management Note (Signed)
Case Management Note  Patient Details  Name: Isaiah [name] MRN: [id number] Date of Birth: 07/05/1940  Subjective/Objective:   Pt presented for Chest Pian. Pt is from home with daughter and her husband. Pt has DME: RW and CPAP at home. Pt is on 02 @ 3L and Staff RN trying to wean. PT will benefit from PT/OT consult  3/28 1236 [name] RN, BSN - s/p coronary stent intervention,  Will be on plavix and asa, await pt/ot eval.  NCM will cont to follow for dc needs.   3/29 1232 [name] RN, BSN- for dc today, [facility] , [facility]andJordan [facility].  He has medication coverage , daughter , Otila BackDonna Garrett will be transporting him home today.  Referral [facility] , received call back and Lupita LeashDonna state [facility] [place] [facility], [facility], referral made .  soc will begin on next Tuesday post dc and daughter states this is ok with her, they would also like to have a wheelchair.  He does not qualify for home oxygen.                                 Action/Plan:   Expected Discharge Date:                  Expected Discharge Plan:  Home w Home Health Services  In-House Referral:  NA  Discharge planning Services  CM Consult  Post Acute Care Choice:  Durable Medical Equipment, Home Health Choice offered to:  Adult Children  DME Arranged:  Government social research officerWheelchair manual DME Agency:  [facility]  HH Arranged:  PT HH Agency[facility]  [name]  Status of Service:  Completed, signed off  If discussed at Long Length of Stay Meetings, dates discussed:    Additional Comments:  Leone Havenaylor, Areg Bialas Clinton, RN 08/09/2016, 2:48 PM

## 2016-08-09 NOTE — Discharge Summary (Signed)
PATIENT DETAILS Name: Isaiah [name] Age: 76 y.o. Sex: male Date of Birth: [birth year] MRN: [id number]. Admitting Physician: Eduard Clos, MD WUJ:[id number],NWGNF T., MD  Admit Date: 08/05/2016 Discharge date: 08/09/2016  Recommendations for Outpatient Follow-up:  1. Follow up with PCP in 1-2 weeks 2. Please obtain BMP/CBC in one week 3. Please check INR at next visit with PCP 4. Needs work for anemia 5. 2-D echo completed prior to discharge: EF 55-65 percent with no regional wall motion abnormalities.  Admitted From:  Home  Disposition: Home   Home Health: No  Equipment/Devices: None  Discharge Condition: Stable  CODE STATUS: FULL CODE  Diet recommendation:  Heart Healthy / Carb Modified   Brief Summary: See H&P, Labs, Consult and Test reports for all details in brief,76 year old male with a past medical history of coronary artery disease status post angioplasty in 1984, followed by CABG in 1997, chronic atrial fibrillation on warfarin, diabetes mellitus type 2, essential hypertension, sleep apnea, chronic kidney disease and anemia presented to the hospital with complaints of chest pain.He was evaluated by cardiology, underwent LHC with PCI to RCA on 3/27.  Brief Hospital Course: Non-STEMI: Seen by cardiology,underwent LHC with PCI to RCA on 3/27. Recommendations are to continue Plavix, Imdur and statin. ASA stopped at the recommendation of cardiology due to increased risk of bleeding.Coumadin has been resumed with target INR of 2-2.5.  2:1 AV block with transient bradycardia: Occurred on 3/29-beta blocker discontinued-follow with cardiology in the outpatient setting to see if this can be resumed.   Paroxysmal atrial fibrillation: Mostly sinus rhythm and telemetry-Coumadin has been resumed,  metoprolol on hold due to 2:1 AV block. CHADSVASc of 7  Chronic diastolic CHF: Remains compensated. Continue Lasix and enalapril.   Diabetes mellitus type 2: CBGs  relatively stable, resume insulin regimen on discharge.  Essential Hypertension: Moderate control-continue with Lasix, hydralazine, Imdur and enalapril.  Dyslipidemia: Continue statin  Obstructive sleep apnea:on CPAP.   Normocytic anemia probably from chronic disease: Mild drop in hemoglobin compared to baseline likely secondary to acute illness. Suspect has some component of Fe deficiency due to low ferritin. No indication to transfuse-patient denied any chest pain or significantly worsening SOB post ambulation this am.Discussed with daughter-will start Fe supplementation-and follow with CBC with PCP. May need colonoscopy at some point-but currently not able to pursue due to PCI.    Chronic kidney disease stage AOZ:HYQMVHQION appears to be at baseline. Follow periodically.  Procedures/Studies: LHC 3/27>>  Ost Cx to Prox Cx lesion, 100 %stenosed. SVG to OM patent, with Y graft SVG to diagonal originating from mid portion of the OM graft. Moderate disease in the origin of the Y graft portion to the diagonal.  Mid LAD lesion, 70 %stenosed. Dist LAD lesion, 100 %stenosed. LIMA to LAD is widely patent.  Ost RCA to Prox RCA lesion, 80 %stenosed. A STENT XIENCE ALPINE RX [id number] drug eluting stent was successfully placed.  Post intervention, there is a 0% residual stenosis.  LV end diastolic pressure is moderately elevated.  There is no aortic valve stenosis  2-D echo 08/09/16: Systolic function was normal. The estimated ejection   fraction was in the range of 55% to 65%. Wall motion was normal;   there were no regional wall motion abnormalities.  Discharge Diagnoses:  Principal Problem:   Non-ST elevation (NSTEMI) myocardial infarction [facility]) Active Problems:   Chronic total occlusion of artery of the extremities (HCC)   Coronary artery disease   Essential hypertension   Hyperlipidemia  Type 2 diabetes mellitus with vascular disease (HCC)   Obstructive sleep apnea   CKD  (chronic kidney disease), stage III   Normochromic normocytic anemia   Discharge Instructions:  Activity:  As tolerated with Full fall precautions use walker/cane & assistance as needed   Discharge Instructions    (HEART FAILURE PATIENTS) Call MD:  Anytime you have any of the following symptoms: 1) 3 pound weight gain in 24 hours or 5 pounds in 1 week 2) shortness of breath, with or without a dry hacking cough 3) swelling in the hands, feet or stomach 4) if you have to sleep on extra pillows at night in order to breathe.    Complete by:  As directed    Amb Referral to Cardiac Rehabilitation    Complete by:  As directed    To [place]   Diagnosis:   Coronary Stents PTCA     Diet - low sodium heart healthy    Complete by:  As directed    Diet Carb Modified    Complete by:  As directed    Increase activity slowly    Complete by:  As directed      Allergies as of 08/09/2016   No Known Allergies     Medication List    STOP taking these medications   aspirin EC 81 MG tablet   metoprolol 50 MG tablet Commonly known as:  LOPRESSOR     TAKE these medications   allopurinol 100 MG tablet Commonly known as:  ZYLOPRIM TAKE 2 TABLETS BY MOUTH DAILY. What changed:  See the new instructions.   clopidogrel 75 MG tablet Commonly known as:  PLAVIX Take 1 tablet (75 mg total) by mouth daily with breakfast. Start taking on:  08/10/2016   enalapril 20 MG tablet Commonly known as:  VASOTEC Take 10 mg by mouth daily.   ferrous sulfate 325 (65 FE) MG tablet Take 1 tablet (325 mg total) by mouth 2 (two) times daily with a meal.   furosemide 40 MG tablet Commonly known as:  LASIX Take 40 mg by mouth See admin instructions. Take 1 tablet (40 mg) by mouth twice daily - morning and noon   gabapentin 100 MG capsule Commonly known as:  NEURONTIN Take 100 mg by mouth 3 (three) times daily.   glipiZIDE 5 MG tablet Commonly known as:  GLUCOTROL Take 5 mg by mouth 2 (two) times daily  before a meal.   hydrALAZINE 100 MG tablet Commonly known as:  APRESOLINE Take 100 mg by mouth 2 (two) times daily.   insulin aspart protamine- aspart (70-30) 100 UNIT/ML injection Commonly known as:  NOVOLOG MIX 70/30 Inject 100 Units into the skin 3 (three) times daily before meals.   isosorbide mononitrate 60 MG 24 hr tablet Commonly known as:  IMDUR Take 1 tablet (60 mg total) by mouth daily. Appointment needed for future refills What changed:  additional instructions   METAMUCIL PO Take 2 capsules by mouth at bedtime.   metolazone 2.5 MG tablet Commonly known as:  ZAROXOLYN Take 1 tablet (2.5 mg total) by mouth as needed. What changed:  reasons to take this   multivitamin with minerals Tabs tablet Take 1 tablet by mouth daily.   pantoprazole 40 MG tablet Commonly known as:  PROTONIX Take 40 mg by mouth 2 (two) times daily before a meal.   pravastatin 40 MG tablet Commonly known as:  PRAVACHOL Take 40 mg by mouth at bedtime.   PRESCRIPTION MEDICATION Inhale into the  lungs at bedtime. CPAP   QUEtiapine 50 MG tablet Commonly known as:  SEROQUEL Take 50 mg by mouth daily with supper.   traMADol 50 MG tablet Commonly known as:  ULTRAM Take 50-100 mg by mouth 3 (three) times daily as needed (pain).   warfarin 4 MG tablet Commonly known as:  COUMADIN Take 4 mg by mouth daily with supper.      Follow-up Information    Barrett, Bjorn Loser, PA-C Follow up on 08/21/2016.   Specialties:  Cardiology, Radiology Why:  Cardiology Hospital Follow-Up on 08/21/2016 at 10:00AM.  Contact information: [address] [place] [address] [phone]        [facility] T., MD. Schedule an appointment as soon as possible for a visit in 1 week(s).   Specialty:  Family Medicine Contact information: [address] [place] [address] [phone]          No Known Allergies   Consultations:   cardiology  Other Procedures/Studies: Dg Chest 2  View  Result Date: 08/05/2016 CLINICAL DATA:  Sudden onset of mid sternal chest pain starting at 15:30 p.m. EXAM: CHEST  2 VIEW COMPARISON:  05/03/2013 FINDINGS: Cardiomediastinal silhouette is stable. Status post CABG. No infiltrate or pulmonary edema. Osteopenia and mild degenerative changes thoracic spine. IMPRESSION: No active cardiopulmonary disease. Status post CABG. Osteopenia and mild degenerative changes thoracic spine. Electronically Signed   By: Natasha Mead M.D.   On: 08/05/2016 19:04      TODAY-DAY OF DISCHARGE:  Subjective:   Isaiah [name] today has no headache,no chest abdominal pain,no new weakness tingling or numbness, feels much better wants to go home today  Objective:   Blood pressure (!) 138/51, pulse 93, temperature 98.2 F (36.8 C), temperature source Oral, resp. rate 13, height 5\' 11"  (1.803 m), weight 106.5 kg (234 lb 12.6 oz), SpO2 96 %.  Intake/Output Summary (Last 24 hours) at 08/09/16 1020 Last data filed at 08/09/16 0700  Gross per 24 hour  Intake              630 ml  Output             2125 ml  Net            -1495 ml   Filed Weights   08/06/16 0507 08/07/16 0507 08/08/16 0600  Weight: 113.4 kg (250 lb) 105.9 kg (233 lb 6.4 oz) 106.5 kg (234 lb 12.6 oz)    Exam: Awake Alert, Oriented *3, No new F.N deficits, Normal affect [place].AT,PERRAL Supple Neck,No JVD, No cervical lymphadenopathy appriciated.  Symmetrical Chest wall movement, Good air movement bilaterally, CTAB RRR,No Gallops,Rubs or new Murmurs, No Parasternal Heave +ve B.Sounds, Abd Soft, Non tender, No organomegaly appriciated, No rebound -guarding or rigidity. No Cyanosis, Clubbing or edema, No new Rash or bruise   PERTINENT RADIOLOGIC STUDIES: Dg Chest 2 View  Result Date: 08/05/2016 CLINICAL DATA:  Sudden onset of mid sternal chest pain starting at 15:30 p.m. EXAM: CHEST  2 VIEW COMPARISON:  05/03/2013 FINDINGS: Cardiomediastinal silhouette is stable. Status post CABG. No infiltrate or  pulmonary edema. Osteopenia and mild degenerative changes thoracic spine. IMPRESSION: No active cardiopulmonary disease. Status post CABG. Osteopenia and mild degenerative changes thoracic spine. Electronically Signed   By: Natasha Mead M.D.   On: 08/05/2016 19:04     PERTINENT LAB RESULTS: CBC:  Recent Labs  08/08/16 0144 08/09/16 0338  WBC 10.8* 10.4  HGB 8.7* 7.9*  HCT 29.8* 27.4*  PLT 248 229  CMET CMP     Component Value Date/Time   NA 136 08/08/2016 0144   K 4.0 08/08/2016 0144   CL 98 (L) 08/08/2016 0144   CO2 29 08/08/2016 0144   GLUCOSE 228 (H) 08/08/2016 0144   BUN 16 08/08/2016 0144   CREATININE 1.20 08/08/2016 0144   CREATININE 1.66 (H) 01/28/2013 1127   CALCIUM 8.9 08/08/2016 0144   PROT 7.8 08/05/2016 1925   ALBUMIN 3.6 08/05/2016 1925   AST 26 08/05/2016 1925   ALT 16 (L) 08/05/2016 1925   ALKPHOS 67 08/05/2016 1925   BILITOT 0.5 08/05/2016 1925   GFRNONAA 57 (L) 08/08/2016 0144   GFRAA >60 08/08/2016 0144    GFR Estimated Creatinine Clearance: 66.1 mL/min (by C-G formula based on SCr of 1.2 mg/dL). No results for input(s): LIPASE, AMYLASE in the last 72 hours.  Recent Labs  08/06/16 1128  TROPONINI 0.03*   Invalid input(s): POCBNP No results for input(s): DDIMER in the last 72 hours. No results for input(s): HGBA1C in the last 72 hours.  Recent Labs  08/07/16 1054  CHOL 79  HDL 26*  LDLCALC 22  TRIG 308*  CHOLHDL 3.0   No results for input(s): TSH, T4TOTAL, T3FREE, THYROIDAB in the last 72 hours.  Invalid input(s): FREET3  Recent Labs  08/08/16 0144  VITAMINB12 845  FOLATE 35.5  FERRITIN 10*  TIBC 487*  IRON 32*  RETICCTPCT 3.5*   Coags:  Recent Labs  08/08/16 0144 08/09/16 0338  INR 1.56 1.32   Microbiology: Recent Results (from the past 240 hour(s))  MRSA PCR Screening     Status: Abnormal   Collection Time: 08/06/16 12:20 AM  Result Value Ref Range Status   MRSA by PCR POSITIVE (A) NEGATIVE Final    Comment:         The GeneXpert MRSA Assay (FDA approved for NASAL specimens only), is one component of a comprehensive MRSA colonization surveillance program. It is not intended to diagnose MRSA infection nor to guide or monitor treatment for MRSA infections. RESULT CALLED TO, READ BACK BY AND VERIFIED WITH: HITT,L RN [phone] 08/06/16 MITCHELL,L     FURTHER DISCHARGE INSTRUCTIONS:  Get Medicines reviewed and adjusted: Please take all your medications with you for your next visit with your Primary MD  Laboratory/radiological data: Please request your Primary MD to go over all hospital tests and procedure/radiological results at the follow up, please ask your Primary MD to get all Hospital records sent to his/her office.  In some cases, they will be blood work, cultures and biopsy results pending at the time of your discharge. Please request that your primary care M.D. goes through all the records of your hospital data and follows up on these results.  Also Note the following: If you experience worsening of your admission symptoms, develop shortness of breath, life threatening emergency, suicidal or homicidal thoughts you must seek medical attention immediately by calling 911 or calling your MD immediately  if symptoms less severe.  You must read complete instructions/literature along with all the possible adverse reactions/side effects for all the Medicines you take and that have been prescribed to you. Take any new Medicines after you have completely understood and accpet all the possible adverse reactions/side effects.   Do not drive when taking Pain medications or sleeping medications (Benzodaizepines)  Do not take more than prescribed Pain, Sleep and Anxiety Medications. It is not advisable to combine anxiety,sleep and pain medications without talking with your primary care practitioner  Special Instructions:  If you have smoked or chewed Tobacco  in the last 2 yrs please stop smoking, stop any  regular Alcohol  and or any Recreational drug use.  Wear Seat belts while driving.  Please note: You were cared for by a hospitalist during your hospital stay. Once you are discharged, your primary care physician will handle any further medical issues. Please note that NO REFILLS for any discharge medications will be authorized once you are discharged, as it is imperative that you return to your primary care physician (or establish a relationship with a primary care physician if you do not have one) for your post hospital discharge needs so that they can reassess your need for medications and monitor your lab values.  Total Time spent coordinating discharge including counseling, education and face to face time equals 45 minutes.  SignedJeoffrey Massed: GHIMIRE,SHANKER 08/09/2016 10:20 AM

## 2016-08-09 NOTE — Progress Notes (Signed)
ANTICOAGULATION CONSULT NOTE - Follow Up Consult  Pharmacy Consult for Coumadin Indication: atrial fibrillation  No Known Allergies  Patient Measurements: Height: 5\' 11"  (180.3 cm) Weight: 234 lb 12.6 oz (106.5 kg) IBW/kg (Calculated) : 75.3  Vital Signs: Temp: 98 F (36.7 C) (03/29 0300) Temp Source: Oral (03/29 0300) BP: 134/71 (03/29 0610) Pulse Rate: 85 (03/29 0610)  Labs:  Recent Labs  08/06/16 1128  08/07/16 0526 08/07/16 1054 08/08/16 0144 08/09/16 0338  HGB  --   < > 8.4*  --  8.7* 7.9*  HCT  --   --  28.9*  --  29.8* 27.4*  PLT  --   --  220  --  248 229  LABPROT  --   < > 21.7* 19.5* 18.8* 16.5*  INR  --   < > 1.86 1.63 1.56 1.32  HEPARINUNFRC  --   --  <0.10*  --   --   --   CREATININE  --   --  1.57*  --  1.20  --   TROPONINI 0.03*  --   --   --   --   --   < > = values in this interval not displayed.  Estimated Creatinine Clearance: 66.1 mL/min (by C-G formula based on SCr of 1.2 mg/dL).  Assessment:  Anticoag: hx PAF; Heparin when INR <2 for [place]/possible NSTEMI;  Resume warfarin 3/28 - INR 1.56>1.32 this AM. Hgb down 8.7>7.9. PTA: 4 mg/d (INR 2.16 on admit). INR goal 2-2.5  Goal of Therapy:  INR 2-2.5 Monitor platelets by anticoagulation protocol: Yes   Plan:  Coumadin 6 mg po x 1 tonight. Daily INR   Tashan Kreitzer S. Merilynn Finlandobertson, PharmD, BCPS Clinical Staff Pharmacist Pager [phone]  Misty Stanleyobertson, Clydine Parkison Stillinger 08/09/2016,8:26 AM

## 2016-08-09 NOTE — Progress Notes (Signed)
Progress Note  Patient Name: Isaiah [name] Date of Encounter: 08/09/2016  Primary Cardiologist: Dr. Allyson Sabal   Subjective   No complaints. Has some soreness in left pectoral region. Otherwise no chest pain or SOB. Has oxygen on.  Inpatient Medications    Scheduled Meds: . allopurinol  100 mg Oral BID  . aspirin  81 mg Oral Daily  . Chlorhexidine Gluconate Cloth  6 each Topical Q0600  . clopidogrel  75 mg Oral Q breakfast  . docusate sodium  100 mg Oral BID  . enalapril  10 mg Oral Daily  . furosemide  40 mg Oral BID  . gabapentin  100 mg Oral TID  . hydrALAZINE  100 mg Oral BID  . insulin aspart  0-9 Units Subcutaneous TID WC  . insulin aspart protamine- aspart  50 Units Subcutaneous BID WC  . isosorbide mononitrate  60 mg Oral Daily  . multivitamin with minerals  1 tablet Oral Daily  . mupirocin ointment  1 application Nasal BID  . pantoprazole  40 mg Oral BID AC  . pravastatin  40 mg Oral QHS  . QUEtiapine  50 mg Oral Q supper  . sodium chloride flush  3 mL Intravenous Q12H  . Warfarin - Pharmacist Dosing Inpatient   Does not apply q1800   Continuous Infusions:  PRN Meds: sodium chloride, acetaminophen, ondansetron (ZOFRAN) IV, sodium chloride flush   Vital Signs    Vitals:   08/08/16 1830 08/08/16 2142 08/09/16 0300 08/09/16 0610  BP:  (!) 167/63 127/86 134/71  Pulse: 90 74  85  Resp:  (!) 25  (!) 22  Temp:  97.6 F (36.4 C) 98 F (36.7 C)   TempSrc:  Oral Oral   SpO2:  98% 98% 97%  Weight:      Height:        Intake/Output Summary (Last 24 hours) at 08/09/16 0720 Last data filed at 08/08/16 2200  Gross per 24 hour  Intake              390 ml  Output             2125 ml  Net            -1735 ml   Filed Weights   08/06/16 0507 08/07/16 0507 08/08/16 0600  Weight: 250 lb (113.4 kg) 233 lb 6.4 oz (105.9 kg) 234 lb 12.6 oz (106.5 kg)    Telemetry    NSR , first degree AV block. No further second degree AV block  - Personally Reviewed  ECG      None today.  Physical Exam   GEN: No acute distress.   Neck: Mild  JVD + left carotid bruit Cardiac: RRR, no murmurs, rubs, or gallops. LE with absent pulses on right. 2+ left femoral and 1+ left DP pulse. Radial pulses 2+ bilateral, no left radial site hematoma. Respiratory: Clear to auscultation bilaterally. GI: Soft, nontender, non-distended  MS: No edema; No deformity. Neuro:  Nonfocal  Psych: Normal affect   Labs    Chemistry  Recent Labs Lab 08/05/16 1925 08/07/16 0526 08/08/16 0144  NA 136 137 136  K 3.7 3.9 4.0  CL 97* 98* 98*  CO2 26 28 29   GLUCOSE 242* 232* 228*  BUN 19 18 16   CREATININE 1.67* 1.57* 1.20  CALCIUM 8.6* 8.7* 8.9  PROT 7.8  --   --   ALBUMIN 3.6  --   --   AST 26  --   --  ALT 16*  --   --   ALKPHOS 67  --   --   BILITOT 0.5  --   --   GFRNONAA 38* 41* 57*  GFRAA 45* 48* >60  ANIONGAP 13 11 9      Hematology  Recent Labs Lab 08/07/16 0526 08/08/16 0144 08/09/16 0338  WBC 8.6 10.8* 10.4  RBC 3.03* 3.13*  3.13* 2.92*  HGB 8.4* 8.7* 7.9*  HCT 28.9* 29.8* 27.4*  MCV 95.4 95.2 93.8  MCH 27.7 27.8 27.1  MCHC 29.1* 29.2* 28.8*  RDW 14.9 15.0 15.0  PLT 220 248 229    Cardiac Enzymes  Recent Labs Lab 08/06/16 0016 08/06/16 0623 08/06/16 1128  TROPONINI 0.03* 0.03* 0.03*     Recent Labs Lab 08/05/16 1932  TROPIPOC 0.00     BNP  Recent Labs Lab 08/05/16 1925  BNP 222.6*     DDimer No results for input(s): DDIMER in the last 168 hours.   Radiology    CHEST  2 VIEW  COMPARISON:  05/03/2013  FINDINGS: Cardiomediastinal silhouette is stable. Status post CABG. No infiltrate or pulmonary edema. Osteopenia and mild degenerative changes thoracic spine.  IMPRESSION: No active cardiopulmonary disease. Status post CABG. Osteopenia and mild degenerative changes thoracic spine.   Electronically Signed   By: Natasha Mead M.D.   On: 08/05/2016 19:04   Cardiac Studies   Procedures   Coronary Stent  Intervention  Left Heart Cath and Cors/Grafts Angiography  Conclusion     Ost Cx to Prox Cx lesion, 100 %stenosed. SVG to OM patent, with Y graft SVG to diagonal originating from mid portion of the OM graft. Moderate disease in the origin of the Y graft portion to the diagonal.  Mid LAD lesion, 70 %stenosed. Dist LAD lesion, 100 %stenosed. LIMA to LAD is widely patent.  Ost RCA to Prox RCA lesion, 80 %stenosed. A STENT XIENCE ALPINE RX 4.0X23 drug eluting stent was successfully placed.  Post intervention, there is a 0% residual stenosis.  LV end diastolic pressure is moderately elevated.  There is no aortic valve stenosis.   Continue clopidogrel for at least a year.  Will likely be on a combination of Coumadin and clopidogrel going forward.  Continue aggressive secondary prevention.  Post cath hydration limited by increased LVEDP.  Anemia also puts him at risk for CIN.  Follow renal function closely.  Hold enalapril for now.    Indications   Unstable angina (HCC) [I20.0 (ICD-10-CM)]  Procedural Details/Technique   Technical Details The risks, benefits, and details of the procedure were explained to the patient. The patient verbalized understanding and wanted to proceed. Informed written consent was obtained.  PROCEDURE TECHNIQUE: After Xylocaine anesthesia a 47F slender sheath was placed in the left radial artery with a single anterior needle wall stick. IV Heparin was given. Right coronary angiography was done using a Judkins R4 guide catheter. Left coronary angiography was done using a Judkins L4 guide catheter. SVG was engaged with an LCB catheter. Left heart cath was done using a JR4 catheter. A TR band was used for hemostasis.  Contrast: 175 cc  Multiple catheter exchanges.  IV angiomax used for PCI. Prowater wide was used. 2.5 balloon was used to predilate. A 4.0 x 23 Xience drug-eluting stent was advanced but would not cross. A BMW wire was then placed for support. The  stent still would not cross. A 3.5 balloon was then used to further predilate before the stent. The 4.0 stent was advanced but again  would not cross. At that point, the guide catheter was changed to extra backup RCA catheter. The pro-water wire was placed again across the area disease. The stent was advanced without difficulty into position. It was used to cover the ostium as well. Stent was deployed at high pressure. The ostium of the stent was flared with the stent balloon. There was no residual stenosis.   Estimated blood loss between 50 mL and 150 mL.  During this procedure the patient was administered the following to achieve and maintain moderate conscious sedation: Versed 1 mg, Fentanyl 50 mcg, while the patient's heart rate, blood pressure, and oxygen saturation were continuously monitored. The period of conscious sedation was 80 minutes, of which I was present face-to-face 100% of this time.    Complications   Complications documented before study signed (08/07/2016 4:46 PM EDT)    No complications were associated with this study.  Documented by Corky Crafts, MD - 08/07/2016 4:39 PM EDT    Coronary Findings   Dominance: Right  Left Anterior Descending  Mid LAD lesion, 70% stenosed. The lesion was previously treated.  Dist LAD lesion, 100% stenosed.  Left Circumflex  Ost Cx to Prox Cx lesion, 100% stenosed.  Right Coronary Artery  Ost RCA to Prox RCA lesion, 80% stenosed. Culprit lesion. The lesion is eccentric and ulcerative.  Angioplasty: Lesion crossed with guidewire using a WIRE ASAHI PROWATER 180CM. Pre-stent angioplasty was performed using a BALLOON EUPHORA RX3.5X12. A STENT XIENCE ALPINE RX 4.0X23 drug eluting stent was successfully placed. Stent strut is well apposed. Post-stent angioplasty was not performed. The pre-interventional distal flow is normal (TIMI 3). The post-interventional distal flow is normal (TIMI 3). The intervention was successful .  There is no  residual stenosis post intervention.  Graft Angiography  saphenous Graft to 1st Mrg  SVG.  Graft to 2nd Diag  Origin lesion, 60% stenosed.  LIMA Graft to Dist LAD  Left Heart   Left Ventricle LV end diastolic pressure is moderately elevated.    Aortic Valve There is no aortic valve stenosis.    Coronary Diagrams   Diagnostic Diagram       Post-Intervention Diagram          Patient Profile     Isaiah [name] is a 76 y.o. male with a history of CAD s/p CABG in 1997, PAF on coumadin, HTN, HLD, DM, CVA, OSA on CPAP, CKD stage III,  PAD and carotid artery disease presents for chest pain.    Assessment & Plan    1.  NSTEMI- type 1- confirmed- troponin low flat trend. Ecg without acute change. - cath findings noted. Patent bypass grafts but 80% ostial/ proximal RCA stenosis. s/p successful PCI with DES.  - Continue  Imdur, and statin. May resume Coumadin- target INR 2-2.5. INR 1.32 today. On Plavix. I would stop ASA at discharge given increased risk of bleeding and Fe deficiency anemia.  - Echo is pending still today to assess LV function. EDP elevated on cath. Will resume lasix and enalapril. Will check with Echo lab about getting this expedited today.  2. PAF - Sinus rhythm on tele with long first degree AV block  - Mobitz type 1 second degree AV block resolved with holding metoprolol.  Would continue to hold metoprolol for now. Will consider resuming at a lower dose on outpatient follow up. - CHADSVASc of 7. Anticoagulation as above.   3. PAD and carotid artery disease - Followed by [name].  4. HLD -  LDL 22.  Continue Pravastatin.   5. CKD, stage III - Scr stable- improved to 1.2. Will resume Lasix at 40 mg bid and Vasotec 10 mg daily  6. Anemia - studies consistent with Fe deficiency anemia and anemia of chronic disease. Per primary care. Needs iron replacement.  7. HTN - BP stable on current medications  8. OSA. On CPAP at home. sats stable on 2 L [place].  Will arrange home O2.   Patient is stable for DC today post Echo. He has follow up appointment scheduled with Theodore Demarkhonda Barrett PA-C on April 10.     Signed,  Mitsy Owen [place], MDFACC 08/09/2016 7:20 AM

## 2016-08-10 ENCOUNTER — Other Ambulatory Visit: Payer: Self-pay

## 2016-08-10 ENCOUNTER — Encounter (HOSPITAL_COMMUNITY): Payer: Self-pay | Admitting: Emergency Medicine

## 2016-08-10 ENCOUNTER — Emergency Department (HOSPITAL_COMMUNITY): Payer: Medicare Other

## 2016-08-10 ENCOUNTER — Inpatient Hospital Stay (HOSPITAL_COMMUNITY)
Admission: EM | Admit: 2016-08-10 | Discharge: 2016-08-14 | DRG: 377 | Disposition: A | Discharge status: Home / Self Care | Payer: Medicare Other | Attending: Internal Medicine | Admitting: Internal Medicine

## 2016-08-10 DIAGNOSIS — Z23 Encounter for immunization: Secondary | ICD-10-CM

## 2016-08-10 DIAGNOSIS — R404 Transient alteration of awareness: Secondary | ICD-10-CM | POA: Diagnosis not present

## 2016-08-10 DIAGNOSIS — Z955 Presence of coronary angioplasty implant and graft: Secondary | ICD-10-CM

## 2016-08-10 DIAGNOSIS — Z79899 Other long term (current) drug therapy: Secondary | ICD-10-CM

## 2016-08-10 DIAGNOSIS — E785 Hyperlipidemia, unspecified: Secondary | ICD-10-CM | POA: Diagnosis present

## 2016-08-10 DIAGNOSIS — Z7902 Long term (current) use of antithrombotics/antiplatelets: Secondary | ICD-10-CM

## 2016-08-10 DIAGNOSIS — Z8673 Personal history of transient ischemic attack (TIA), and cerebral infarction without residual deficits: Secondary | ICD-10-CM

## 2016-08-10 DIAGNOSIS — E1159 Type 2 diabetes mellitus with other circulatory complications: Secondary | ICD-10-CM | POA: Diagnosis present

## 2016-08-10 DIAGNOSIS — D509 Iron deficiency anemia, unspecified: Secondary | ICD-10-CM | POA: Diagnosis present

## 2016-08-10 DIAGNOSIS — G4733 Obstructive sleep apnea (adult) (pediatric): Secondary | ICD-10-CM | POA: Diagnosis present

## 2016-08-10 DIAGNOSIS — Z951 Presence of aortocoronary bypass graft: Secondary | ICD-10-CM

## 2016-08-10 DIAGNOSIS — K922 Gastrointestinal hemorrhage, unspecified: Secondary | ICD-10-CM | POA: Diagnosis not present

## 2016-08-10 DIAGNOSIS — I2510 Atherosclerotic heart disease of native coronary artery without angina pectoris: Secondary | ICD-10-CM | POA: Diagnosis present

## 2016-08-10 DIAGNOSIS — R471 Dysarthria and anarthria: Secondary | ICD-10-CM | POA: Diagnosis present

## 2016-08-10 DIAGNOSIS — Z794 Long term (current) use of insulin: Secondary | ICD-10-CM

## 2016-08-10 DIAGNOSIS — N183 Chronic kidney disease, stage 3 unspecified: Secondary | ICD-10-CM | POA: Diagnosis present

## 2016-08-10 DIAGNOSIS — Z8249 Family history of ischemic heart disease and other diseases of the circulatory system: Secondary | ICD-10-CM

## 2016-08-10 DIAGNOSIS — E1151 Type 2 diabetes mellitus with diabetic peripheral angiopathy without gangrene: Secondary | ICD-10-CM | POA: Diagnosis not present

## 2016-08-10 DIAGNOSIS — I252 Old myocardial infarction: Secondary | ICD-10-CM

## 2016-08-10 DIAGNOSIS — I482 Chronic atrial fibrillation, unspecified: Secondary | ICD-10-CM | POA: Diagnosis present

## 2016-08-10 DIAGNOSIS — Z87891 Personal history of nicotine dependence: Secondary | ICD-10-CM

## 2016-08-10 DIAGNOSIS — K921 Melena: Secondary | ICD-10-CM | POA: Diagnosis not present

## 2016-08-10 DIAGNOSIS — E1122 Type 2 diabetes mellitus with diabetic chronic kidney disease: Secondary | ICD-10-CM | POA: Diagnosis present

## 2016-08-10 DIAGNOSIS — R531 Weakness: Secondary | ICD-10-CM

## 2016-08-10 DIAGNOSIS — I130 Hypertensive heart and chronic kidney disease with heart failure and stage 1 through stage 4 chronic kidney disease, or unspecified chronic kidney disease: Secondary | ICD-10-CM | POA: Diagnosis present

## 2016-08-10 DIAGNOSIS — I10 Essential (primary) hypertension: Secondary | ICD-10-CM | POA: Diagnosis not present

## 2016-08-10 DIAGNOSIS — Z7901 Long term (current) use of anticoagulants: Secondary | ICD-10-CM

## 2016-08-10 DIAGNOSIS — E876 Hypokalemia: Secondary | ICD-10-CM | POA: Diagnosis present

## 2016-08-10 DIAGNOSIS — R195 Other fecal abnormalities: Secondary | ICD-10-CM | POA: Diagnosis present

## 2016-08-10 DIAGNOSIS — I872 Venous insufficiency (chronic) (peripheral): Secondary | ICD-10-CM | POA: Diagnosis present

## 2016-08-10 DIAGNOSIS — Z86718 Personal history of other venous thrombosis and embolism: Secondary | ICD-10-CM

## 2016-08-10 DIAGNOSIS — D649 Anemia, unspecified: Secondary | ICD-10-CM

## 2016-08-10 DIAGNOSIS — R0602 Shortness of breath: Secondary | ICD-10-CM | POA: Diagnosis not present

## 2016-08-10 DIAGNOSIS — I5033 Acute on chronic diastolic (congestive) heart failure: Secondary | ICD-10-CM | POA: Diagnosis not present

## 2016-08-10 HISTORY — DX: Non-ST elevation (NSTEMI) myocardial infarction: I21.4

## 2016-08-10 LAB — I-STAT TROPONIN, ED: Troponin i, poc: 0.01 ng/mL (ref 0.00–0.08)

## 2016-08-10 LAB — I-STAT CG4 LACTIC ACID, ED: Lactic Acid, Venous: 0.91 mmol/L (ref 0.5–1.9)

## 2016-08-10 LAB — CBC WITH DIFFERENTIAL/PLATELET
Basophils Absolute: 0.1 10*3/uL (ref 0.0–0.1)
Basophils Relative: 1 %
Eosinophils Absolute: 0.4 10*3/uL (ref 0.0–0.7)
Eosinophils Relative: 4 %
HCT: 26.8 % — ABNORMAL LOW (ref 39.0–52.0)
Hemoglobin: 7.9 g/dL — ABNORMAL LOW (ref 13.0–17.0)
Lymphocytes Relative: 30 %
Lymphs Abs: 2.8 10*3/uL (ref 0.7–4.0)
MCH: 27.7 pg (ref 26.0–34.0)
MCHC: 29.5 g/dL — ABNORMAL LOW (ref 30.0–36.0)
MCV: 94 fL (ref 78.0–100.0)
Monocytes Absolute: 0.7 10*3/uL (ref 0.1–1.0)
Monocytes Relative: 8 %
Neutro Abs: 5.4 10*3/uL (ref 1.7–7.7)
Neutrophils Relative %: 57 %
Platelets: 226 10*3/uL (ref 150–400)
RBC: 2.85 MIL/uL — ABNORMAL LOW (ref 4.22–5.81)
RDW: 15.3 % (ref 11.5–15.5)
WBC: 9.3 10*3/uL (ref 4.0–10.5)

## 2016-08-10 NOTE — ED Provider Notes (Signed)
[facility]-EMERGENCY DEPT Provider Note   CSN: [id number] Arrival date & time: 08/10/16  2230   By signing my name below, I, Clovis Pu, attest that this documentation has been prepared under the direction and in the presence of Gilda Crease, MD  Electronically Signed: Clovis Pu, ED Scribe. 08/10/16. 11:21 PM.   History   Chief Complaint Chief Complaint  Patient presents with  . Weakness    HPI Comments:  Isaiah [name] is a 76 y.o. male, with a PMHx of CAD, CHF, MI, atrial fibrillation, DM, DVT, hyperlipidemia and HTN, who presents to the Emergency Department complaining of acute onset weakness x yesterday. Pt also reports associated SOB. His SOB is worse with exertion. Pt was discharged from the hospital yesterday after a NSTEMI and coronary stent intervention. He was also recently started on Plavix. No alleviating factors noted. Pt denies chest pain, a cough or any other associated symptoms. No other complaints noted.    The history is provided by the patient. No language interpreter was used.    Past Medical History:  Diagnosis Date  . Atrial fibrillation (HCC)   . CAD (coronary artery disease)   . CHF (congestive heart failure) (HCC)   . Diabetes mellitus   . DVT (deep venous thrombosis) (HCC)   . Fall at home 10/2015  . Hyperlipidemia   . Hypertension   . Left-sided carotid artery disease (HCC)   . Myocardial infarction   . NSTEMI (non-ST elevated myocardial infarction) (HCC)   . Peripheral vascular disease (HCC)   . Stroke (HCC)   . Venous insufficiency     Patient Active Problem List   Diagnosis Date Noted  . Non-ST elevation (NSTEMI) myocardial infarction (HCC) 08/08/2016  . Unstable angina (HCC) 08/06/2016  . Chest pain 08/05/2016  . CKD (chronic kidney disease), stage III 08/05/2016  . Normochromic normocytic anemia 08/05/2016  . Pain in joint, lower leg 10/01/2014  . Coronary artery disease 04/27/2014  . Chronic atrial fibrillation (HCC)  04/27/2014  . Essential hypertension 04/27/2014  . Hyperlipidemia 04/27/2014  . Type 2 diabetes mellitus with vascular disease (HCC) 04/27/2014  . Obstructive sleep apnea 04/27/2014  . Stroke (HCC) 04/27/2014  . On continuous oral anticoagulation 04/27/2014  . Occlusion and stenosis of carotid artery without mention of cerebral infarction 02/04/2013  . Aftercare following surgery of the circulatory system, NEC 01/09/2012  . Chronic total occlusion of artery of the extremities (HCC) 01/09/2012    Past Surgical History:  Procedure Laterality Date  . CORONARY ARTERY BYPASS GRAFT  1997   vein harvest from right leg  . CORONARY STENT INTERVENTION N/A 08/07/2016   Procedure: Coronary Stent Intervention;  Surgeon: Corky Crafts, MD;  Location: [facility] INVASIVE CV LAB;  Service: Cardiovascular;  Laterality: N/A;  RCA  . INGUINAL HERNIA REPAIR  1980's   Bilateral,  done in [facility]  . LEFT HEART CATH AND CORS/GRAFTS ANGIOGRAPHY N/A 08/07/2016   Procedure: Left Heart Cath and Cors/Grafts Angiography;  Surgeon: Corky Crafts, MD;  Location: [facility] INVASIVE CV LAB;  Service: Cardiovascular;  Laterality: N/A;  . PR VEIN BYPASS GRAFT,AORTO-FEM-POP  1980   FEM-FEM BPG by [name]       Home Medications    Prior to Admission medications   Medication Sig Start Date End Date Taking? Authorizing Provider  allopurinol (ZYLOPRIM) 100 MG tablet TAKE 2 TABLETS BY MOUTH DAILY. Patient taking differently: TAKE 1 TABLET BY MOUTH TWICE DAILY 05/03/14  Yes Runell Gess, MD  clopidogrel (PLAVIX)  75 MG tablet Take 1 tablet (75 mg total) by mouth daily with breakfast. 08/10/16  Yes Shanker Levora Dredge, MD  enalapril (VASOTEC) 20 MG tablet Take 10 mg by mouth daily.    Yes Historical Provider, MD  ferrous sulfate 325 (65 FE) MG tablet Take 1 tablet (325 mg total) by mouth 2 (two) times daily with a meal. 08/09/16  Yes Shanker Levora Dredge, MD  furosemide (LASIX) 40 MG tablet Take 40 mg by mouth See admin  instructions. Take 1 tablet (40 mg) by mouth twice daily - morning and noon   Yes Historical Provider, MD  gabapentin (NEURONTIN) 100 MG capsule Take 100 mg by mouth 3 (three) times daily.  01/28/13  Yes Historical Provider, MD  glipiZIDE (GLUCOTROL) 5 MG tablet Take 5 mg by mouth 2 (two) times daily before a meal.   Yes Historical Provider, MD  hydrALAZINE (APRESOLINE) 100 MG tablet Take 100 mg by mouth 2 (two) times daily.   Yes Historical Provider, MD  insulin aspart protamine- aspart (NOVOLOG MIX 70/30) (70-30) 100 UNIT/ML injection Inject 100 Units into the skin 3 (three) times daily before meals.   Yes Historical Provider, MD  isosorbide mononitrate (IMDUR) 60 MG 24 hr tablet Take 1 tablet (60 mg total) by mouth daily. Appointment needed for future refills 05/03/15  Yes Runell Gess, MD  metolazone (ZAROXOLYN) 2.5 MG tablet Take 1 tablet (2.5 mg total) by mouth as needed. Patient taking differently: Take 2.5 mg by mouth as needed (fluid build up/ weight gain).  04/29/14  Yes Runell Gess, MD  Multiple Vitamin (MULTIVITAMIN WITH MINERALS) TABS tablet Take 1 tablet by mouth daily.   Yes Historical Provider, MD  pantoprazole (PROTONIX) 40 MG tablet Take 40 mg by mouth 2 (two) times daily before a meal.    Yes Historical Provider, MD  pravastatin (PRAVACHOL) 40 MG tablet Take 40 mg by mouth at bedtime.    Yes Historical Provider, MD  PRESCRIPTION MEDICATION Inhale into the lungs at bedtime. CPAP   Yes Historical Provider, MD  Psyllium (METAMUCIL PO) Take 2 capsules by mouth at bedtime.   Yes Historical Provider, MD  QUEtiapine (SEROQUEL) 50 MG tablet Take 50 mg by mouth daily with supper.   Yes Historical Provider, MD  traMADol (ULTRAM) 50 MG tablet Take 50-100 mg by mouth 3 (three) times daily as needed (pain).  08/30/15  Yes Historical Provider, MD  warfarin (COUMADIN) 4 MG tablet Take 4 mg by mouth daily with supper.   Yes Historical Provider, MD    Family History Family History    Problem Relation Age of Onset  . Other Mother     varicose veins  . Heart disease Mother   . Hyperlipidemia Mother   . Hypertension Mother   . Varicose Veins Mother   . Cancer Father   . Diabetes Father   . Heart disease Father     before age 81  . Hyperlipidemia Father   . Hypertension Father   . Other Father     varicose veins  . Heart attack Father   . Diabetes Daughter   . Hyperlipidemia Daughter   . Hypertension Daughter   . Other Daughter     varicose veins  . Varicose Veins Daughter   . Hypertension Son   . Diabetes Sister   . Heart disease Sister     DVT  . Other Sister     varicose veins  . Hyperlipidemia Sister   . Hypertension Sister   .  Varicose Veins Sister   . Other Brother     varicose veins  . Hyperlipidemia Brother   . Hypertension Brother   . Heart attack Brother     Social History Social History  Substance Use Topics  . Smoking status: Former Smoker    Types: Cigarettes    Quit date: 05/14/1978  . Smokeless tobacco: Never Used  . Alcohol use No     Allergies   Patient has no known allergies.   Review of Systems Review of Systems  Respiratory: Positive for shortness of breath.   Cardiovascular: Negative for chest pain.  Neurological: Positive for weakness.  All other systems reviewed and are negative.    Physical Exam Updated Vital Signs BP (!) 148/53   Pulse 79   Temp 97.9 F (36.6 C) (Oral)   Resp (!) 21   SpO2 97%   Physical Exam  Constitutional: He is oriented to person, place, and time. He appears well-developed and well-nourished. No distress.  HENT:  Head: Normocephalic and atraumatic.  Right Ear: Hearing normal.  Left Ear: Hearing normal.  Nose: Nose normal.  Mouth/Throat: Oropharynx is clear and moist and mucous membranes are normal.  Eyes: Conjunctivae and EOM are normal. Pupils are equal, round, and reactive to light.  Neck: Normal range of motion. Neck supple.  Cardiovascular: Regular rhythm, S1 normal and  S2 normal.  Exam reveals gallop and S4. Exam reveals no friction rub.   No murmur heard. Pulmonary/Chest: Effort normal. No respiratory distress. He has rales. He exhibits no tenderness.  Occasional scattered rales.   Abdominal: Soft. Normal appearance and bowel sounds are normal. There is no hepatosplenomegaly. There is no tenderness. There is no rebound, no guarding, no tenderness at McBurney's point and negative Murphy's sign. No hernia.  Musculoskeletal: Normal range of motion. He exhibits edema.  1+ edema to bilateral lower extremities  Neurological: He is alert and oriented to person, place, and time. He has normal strength. No cranial nerve deficit or sensory deficit. Coordination normal. GCS eye subscore is 4. GCS verbal subscore is 5. GCS motor subscore is 6.  Skin: Skin is warm, dry and intact. No rash noted. No cyanosis.  Psychiatric: He has a normal mood and affect. His speech is normal and behavior is normal. Thought content normal.  Nursing note and vitals reviewed.    ED Treatments / Results  DIAGNOSTIC STUDIES:  Oxygen Saturation is 94% on RA, adequate by my interpretation.    COORDINATION OF CARE:  11:18 PM Discussed treatment plan with pt at bedside and pt agreed to plan.  Labs (all labs ordered are listed, but only abnormal results are displayed) Labs Reviewed  CBC WITH DIFFERENTIAL/PLATELET - Abnormal; Notable for the following:       Result Value   RBC 2.85 (*)    Hemoglobin 7.9 (*)    HCT 26.8 (*)    MCHC 29.5 (*)    All other components within normal limits  COMPREHENSIVE METABOLIC PANEL - Abnormal; Notable for the following:    Potassium 3.2 (*)    Glucose, Bld 145 (*)    Creatinine, Ser 1.61 (*)    Calcium 8.6 (*)    Albumin 3.2 (*)    ALT 13 (*)    GFR calc non Af Amer 40 (*)    GFR calc Af Amer 47 (*)    All other components within normal limits  BRAIN NATRIURETIC PEPTIDE - Abnormal; Notable for the following:    B Natriuretic Peptide 121.5 (*)  All other components within normal limits  POC OCCULT BLOOD, ED - Abnormal; Notable for the following:    Fecal Occult Bld POSITIVE (*)    All other components within normal limits  URINALYSIS, ROUTINE W REFLEX MICROSCOPIC  PROTIME-INR  I-STAT CG4 LACTIC ACID, ED  I-STAT TROPOININ, ED  TYPE AND SCREEN    EKG  EKG Interpretation  Date/Time:  Friday August 10 2016 22:38:06 EDT Ventricular Rate:  80 PR Interval:    QRS Duration: 99 QT Interval:  484 QTC Calculation: 559 R Axis:   31 Text Interpretation:  Accelerated junctional rhythm Borderline T abnormalities, lateral leads Prolonged QT interval No significant change since last tracing Confirmed by Blinda Leatherwood  MD, Lesly Joslyn [phone]) on 08/11/2016 12:24:36 AM       Radiology Dg Chest Port 1 View  Result Date: 08/10/2016 CLINICAL DATA:  Shortness of breath, weakness. EXAM: PORTABLE CHEST 1 VIEW COMPARISON:  Radiographs 5 days prior 08/05/2016 FINDINGS: Stable cardiomegaly, post median sternotomy and CABG. Atherosclerosis of the thoracic aorta. Trace chronic blunting of the costophrenic angles. No pulmonary edema. No focal airspace disease. No pneumothorax. Multiple overlying monitoring devices project over the lower chest. IMPRESSION: Stable cardiomegaly post CABG.  Thoracic aortic atherosclerosis. No acute abnormality. Electronically Signed   By: Rubye Oaks M.D.   On: 08/10/2016 23:15    Procedures Procedures (including critical care time)  Medications Ordered in ED Medications - No data to display   Initial Impression / Assessment and Plan / ED Course  I have reviewed the triage vital signs and the nursing notes.  Pertinent labs & imaging results that were available during my care of the patient were reviewed by me and considered in my medical decision making (see chart for details).     Patient presents to the emergency department for evaluation of shortness of breath and generalized weakness. Patient was recently  hospitalized for NSTEMI. He had cardiac catheterization during his stay. He was noted to be anemic during his hospital stay. He was discharged one day ago. Since arriving at home he has had progressive worsening of shortness of breath. He reports severe shortness of breath with minimal exertion, inability to ambulate around his home because of the weakness and shortness of breath. No associated chest pain. No evidence of EKG changes. Troponin negative. BNP is not elevated, no signs of post MI congestive heart failure.  Bloodwork reveals worsening of his anemia. I did perform a rectal exam and although there was no melena, Hemoccult was positive. His hemoglobin is 7.9. In this cardiac patient, will require hospitalization for further workup of anemia and possible transfusion for symptomatically anemia.  Final Clinical Impressions(s) / ED Diagnoses   Final diagnoses:  Symptomatic anemia    New Prescriptions New Prescriptions   No medications on file  I personally performed the services described in this documentation, which was scribed in my presence. The recorded information has been reviewed and is accurate.     Gilda Crease, MD 08/11/16 [phone]

## 2016-08-10 NOTE — ED Triage Notes (Signed)
Per [facility], Pt from home, discharged from [facility] yesterday after Non-STEMI and stent placement. Pt started on Plavix.  Pt reports generalized weakness starting today. Pt's family states pt was pursed lip breathing and attempting to check CBG but unable to due to weakness. CBG was 96.  EMS reports BP 102/40 upon arrival. Pt received 500 ml NS en route. Pt's O2 sats 92-94%. Pt placed on 2L [place]

## 2016-08-11 ENCOUNTER — Encounter (HOSPITAL_COMMUNITY): Payer: Self-pay | Admitting: Family Medicine

## 2016-08-11 ENCOUNTER — Observation Stay (HOSPITAL_COMMUNITY): Payer: Medicare Other

## 2016-08-11 DIAGNOSIS — Z7902 Long term (current) use of antithrombotics/antiplatelets: Secondary | ICD-10-CM | POA: Diagnosis not present

## 2016-08-11 DIAGNOSIS — R531 Weakness: Secondary | ICD-10-CM

## 2016-08-11 DIAGNOSIS — I872 Venous insufficiency (chronic) (peripheral): Secondary | ICD-10-CM | POA: Diagnosis present

## 2016-08-11 DIAGNOSIS — R471 Dysarthria and anarthria: Secondary | ICD-10-CM | POA: Diagnosis present

## 2016-08-11 DIAGNOSIS — K921 Melena: Secondary | ICD-10-CM | POA: Diagnosis not present

## 2016-08-11 DIAGNOSIS — E876 Hypokalemia: Secondary | ICD-10-CM | POA: Diagnosis not present

## 2016-08-11 DIAGNOSIS — I10 Essential (primary) hypertension: Secondary | ICD-10-CM

## 2016-08-11 DIAGNOSIS — Z87891 Personal history of nicotine dependence: Secondary | ICD-10-CM | POA: Diagnosis not present

## 2016-08-11 DIAGNOSIS — Z79899 Other long term (current) drug therapy: Secondary | ICD-10-CM | POA: Diagnosis not present

## 2016-08-11 DIAGNOSIS — I482 Chronic atrial fibrillation: Secondary | ICD-10-CM

## 2016-08-11 DIAGNOSIS — G4733 Obstructive sleep apnea (adult) (pediatric): Secondary | ICD-10-CM | POA: Diagnosis not present

## 2016-08-11 DIAGNOSIS — I5033 Acute on chronic diastolic (congestive) heart failure: Secondary | ICD-10-CM | POA: Diagnosis present

## 2016-08-11 DIAGNOSIS — R195 Other fecal abnormalities: Secondary | ICD-10-CM | POA: Diagnosis not present

## 2016-08-11 DIAGNOSIS — D509 Iron deficiency anemia, unspecified: Secondary | ICD-10-CM | POA: Diagnosis not present

## 2016-08-11 DIAGNOSIS — E1151 Type 2 diabetes mellitus with diabetic peripheral angiopathy without gangrene: Secondary | ICD-10-CM | POA: Diagnosis present

## 2016-08-11 DIAGNOSIS — N183 Chronic kidney disease, stage 3 (moderate): Secondary | ICD-10-CM

## 2016-08-11 DIAGNOSIS — I2583 Coronary atherosclerosis due to lipid rich plaque: Secondary | ICD-10-CM | POA: Diagnosis not present

## 2016-08-11 DIAGNOSIS — E1159 Type 2 diabetes mellitus with other circulatory complications: Secondary | ICD-10-CM | POA: Diagnosis not present

## 2016-08-11 DIAGNOSIS — I252 Old myocardial infarction: Secondary | ICD-10-CM | POA: Diagnosis not present

## 2016-08-11 DIAGNOSIS — K922 Gastrointestinal hemorrhage, unspecified: Secondary | ICD-10-CM | POA: Diagnosis not present

## 2016-08-11 DIAGNOSIS — Z23 Encounter for immunization: Secondary | ICD-10-CM | POA: Diagnosis not present

## 2016-08-11 DIAGNOSIS — Z794 Long term (current) use of insulin: Secondary | ICD-10-CM | POA: Diagnosis not present

## 2016-08-11 DIAGNOSIS — I130 Hypertensive heart and chronic kidney disease with heart failure and stage 1 through stage 4 chronic kidney disease, or unspecified chronic kidney disease: Secondary | ICD-10-CM | POA: Diagnosis not present

## 2016-08-11 DIAGNOSIS — Z951 Presence of aortocoronary bypass graft: Secondary | ICD-10-CM | POA: Diagnosis not present

## 2016-08-11 DIAGNOSIS — Z8249 Family history of ischemic heart disease and other diseases of the circulatory system: Secondary | ICD-10-CM | POA: Diagnosis not present

## 2016-08-11 DIAGNOSIS — E785 Hyperlipidemia, unspecified: Secondary | ICD-10-CM | POA: Diagnosis present

## 2016-08-11 DIAGNOSIS — I2510 Atherosclerotic heart disease of native coronary artery without angina pectoris: Secondary | ICD-10-CM

## 2016-08-11 DIAGNOSIS — Z955 Presence of coronary angioplasty implant and graft: Secondary | ICD-10-CM | POA: Diagnosis not present

## 2016-08-11 DIAGNOSIS — Z7901 Long term (current) use of anticoagulants: Secondary | ICD-10-CM | POA: Diagnosis not present

## 2016-08-11 DIAGNOSIS — E1122 Type 2 diabetes mellitus with diabetic chronic kidney disease: Secondary | ICD-10-CM | POA: Diagnosis present

## 2016-08-11 DIAGNOSIS — D649 Anemia, unspecified: Secondary | ICD-10-CM | POA: Diagnosis not present

## 2016-08-11 LAB — COMPREHENSIVE METABOLIC PANEL
ALT: 13 U/L — ABNORMAL LOW (ref 17–63)
AST: 22 U/L (ref 15–41)
Albumin: 3.2 g/dL — ABNORMAL LOW (ref 3.5–5.0)
Alkaline Phosphatase: 53 U/L (ref 38–126)
Anion gap: 10 (ref 5–15)
BUN: 19 mg/dL (ref 6–20)
CO2: 31 mmol/L (ref 22–32)
Calcium: 8.6 mg/dL — ABNORMAL LOW (ref 8.9–10.3)
Chloride: 102 mmol/L (ref 101–111)
Creatinine, Ser: 1.61 mg/dL — ABNORMAL HIGH (ref 0.61–1.24)
GFR calc Af Amer: 47 mL/min — ABNORMAL LOW (ref >60)
GFR calc non Af Amer: 40 mL/min — ABNORMAL LOW (ref >60)
Glucose, Bld: 145 mg/dL — ABNORMAL HIGH (ref 65–99)
Potassium: 3.2 mmol/L — ABNORMAL LOW (ref 3.5–5.1)
Sodium: 143 mmol/L (ref 135–145)
Total Bilirubin: 0.4 mg/dL (ref 0.3–1.2)
Total Protein: 6.7 g/dL (ref 6.5–8.1)

## 2016-08-11 LAB — PROTIME-INR
INR: 1.35
Prothrombin Time: 16.8 seconds — ABNORMAL HIGH (ref 11.4–15.2)

## 2016-08-11 LAB — URINALYSIS, ROUTINE W REFLEX MICROSCOPIC
Bilirubin Urine: NEGATIVE
Glucose, UA: NEGATIVE mg/dL
Hgb urine dipstick: NEGATIVE
Ketones, ur: NEGATIVE mg/dL
Leukocytes, UA: NEGATIVE
Nitrite: NEGATIVE
Protein, ur: NEGATIVE mg/dL
Specific Gravity, Urine: 1.013 (ref 1.005–1.030)
pH: 5 (ref 5.0–8.0)

## 2016-08-11 LAB — TROPONIN I
Troponin I: <0.03 ng/mL (ref <0.03)
Troponin I: <0.03 ng/mL (ref <0.03)

## 2016-08-11 LAB — TYPE AND SCREEN
ABO/RH(D): A POS
Antibody Screen: NEGATIVE

## 2016-08-11 LAB — ABO/RH: ABO/RH(D): A POS

## 2016-08-11 LAB — GLUCOSE, CAPILLARY
Glucose-Capillary: 175 mg/dL — ABNORMAL HIGH (ref 65–99)
Glucose-Capillary: 203 mg/dL — ABNORMAL HIGH (ref 65–99)
Glucose-Capillary: 209 mg/dL — ABNORMAL HIGH (ref 65–99)
Glucose-Capillary: 224 mg/dL — ABNORMAL HIGH (ref 65–99)

## 2016-08-11 LAB — TSH: TSH: 2.504 u[IU]/mL (ref 0.350–4.500)

## 2016-08-11 LAB — BRAIN NATRIURETIC PEPTIDE: B Natriuretic Peptide: 121.5 pg/mL — ABNORMAL HIGH (ref 0.0–100.0)

## 2016-08-11 LAB — POC OCCULT BLOOD, ED: Fecal Occult Bld: POSITIVE — AB

## 2016-08-11 MED ORDER — POTASSIUM CHLORIDE CRYS ER 20 MEQ PO TBCR
40.0000 meq | EXTENDED_RELEASE_TABLET | Freq: Once | ORAL | Status: AC
  Administered 2016-08-11: 40 meq via ORAL
  Filled 2016-08-11: qty 2

## 2016-08-11 MED ORDER — ISOSORBIDE MONONITRATE ER 60 MG PO TB24
60.0000 mg | ORAL_TABLET | Freq: Every day | ORAL | Status: DC
  Administered 2016-08-11 – 2016-08-14 (×4): 60 mg via ORAL
  Filled 2016-08-11: qty 1
  Filled 2016-08-11: qty 2
  Filled 2016-08-11: qty 1
  Filled 2016-08-11: qty 2

## 2016-08-11 MED ORDER — FUROSEMIDE 10 MG/ML IJ SOLN
60.0000 mg | Freq: Two times a day (BID) | INTRAMUSCULAR | Status: DC
  Administered 2016-08-11 – 2016-08-14 (×6): 60 mg via INTRAVENOUS
  Filled 2016-08-11 (×6): qty 6

## 2016-08-11 MED ORDER — TRAMADOL HCL 50 MG PO TABS: 50.0000 mg | ORAL_TABLET | Freq: Three times a day (TID) | ORAL | Status: DC | PRN

## 2016-08-11 MED ORDER — ACETAMINOPHEN 325 MG PO TABS: 650.0000 mg | ORAL_TABLET | Freq: Four times a day (QID) | ORAL | Status: DC | PRN

## 2016-08-11 MED ORDER — HYDRALAZINE HCL 50 MG PO TABS
100.0000 mg | ORAL_TABLET | Freq: Two times a day (BID) | ORAL | Status: DC
  Administered 2016-08-11 – 2016-08-14 (×7): 100 mg via ORAL
  Filled 2016-08-11 (×7): qty 2

## 2016-08-11 MED ORDER — INSULIN ASPART 100 UNIT/ML ~~LOC~~ SOLN
0.0000 [IU] | Freq: Every day | SUBCUTANEOUS | Status: DC
  Administered 2016-08-11: 2 [IU] via SUBCUTANEOUS

## 2016-08-11 MED ORDER — ONDANSETRON HCL 4 MG PO TABS: 4.0000 mg | ORAL_TABLET | Freq: Four times a day (QID) | ORAL | Status: DC | PRN

## 2016-08-11 MED ORDER — QUETIAPINE FUMARATE 25 MG PO TABS
50.0000 mg | ORAL_TABLET | Freq: Every day | ORAL | Status: DC
  Administered 2016-08-11 – 2016-08-14 (×4): 50 mg via ORAL
  Filled 2016-08-11: qty 2
  Filled 2016-08-11: qty 1
  Filled 2016-08-11: qty 2
  Filled 2016-08-11: qty 1
  Filled 2016-08-11 (×2): qty 2

## 2016-08-11 MED ORDER — SODIUM CHLORIDE 0.9% FLUSH: 3.0000 mL | INTRAVENOUS | Status: DC | PRN

## 2016-08-11 MED ORDER — INSULIN ASPART 100 UNIT/ML ~~LOC~~ SOLN
10.0000 [IU] | Freq: Three times a day (TID) | SUBCUTANEOUS | Status: DC
  Administered 2016-08-11 – 2016-08-14 (×11): 10 [IU] via SUBCUTANEOUS

## 2016-08-11 MED ORDER — PNEUMOCOCCAL VAC POLYVALENT 25 MCG/0.5ML IJ INJ
0.5000 mL | INJECTION | INTRAMUSCULAR | Status: AC
  Administered 2016-08-13: 0.5 mL via INTRAMUSCULAR
  Filled 2016-08-11: qty 0.5

## 2016-08-11 MED ORDER — SODIUM CHLORIDE 0.9 % IV SOLN: 250.0000 mL | INTRAVENOUS | Status: DC | PRN

## 2016-08-11 MED ORDER — PRAVASTATIN SODIUM 40 MG PO TABS
40.0000 mg | ORAL_TABLET | Freq: Every day | ORAL | Status: DC
  Administered 2016-08-11 – 2016-08-13 (×3): 40 mg via ORAL
  Filled 2016-08-11 (×3): qty 1

## 2016-08-11 MED ORDER — INSULIN ASPART 100 UNIT/ML ~~LOC~~ SOLN
0.0000 [IU] | Freq: Three times a day (TID) | SUBCUTANEOUS | Status: DC
  Administered 2016-08-11: 4 [IU] via SUBCUTANEOUS
  Administered 2016-08-11 – 2016-08-12 (×3): 7 [IU] via SUBCUTANEOUS
  Administered 2016-08-12: 4 [IU] via SUBCUTANEOUS
  Administered 2016-08-12: 7 [IU] via SUBCUTANEOUS
  Administered 2016-08-13: 4 [IU] via SUBCUTANEOUS
  Administered 2016-08-13 (×2): 7 [IU] via SUBCUTANEOUS
  Administered 2016-08-14: 15 [IU] via SUBCUTANEOUS
  Administered 2016-08-14: 7 [IU] via SUBCUTANEOUS
  Administered 2016-08-14: 4 [IU] via SUBCUTANEOUS

## 2016-08-11 MED ORDER — POTASSIUM CHLORIDE CRYS ER 20 MEQ PO TBCR
30.0000 meq | EXTENDED_RELEASE_TABLET | Freq: Once | ORAL | Status: AC
  Administered 2016-08-11: 30 meq via ORAL
  Filled 2016-08-11: qty 1

## 2016-08-11 MED ORDER — SODIUM CHLORIDE 0.9 % IV SOLN
INTRAVENOUS | Status: DC
  Administered 2016-08-11: 22:00:00 via INTRAVENOUS

## 2016-08-11 MED ORDER — METOLAZONE 2.5 MG PO TABS
2.5000 mg | ORAL_TABLET | Freq: Every day | ORAL | Status: DC
  Administered 2016-08-11 – 2016-08-14 (×4): 2.5 mg via ORAL
  Filled 2016-08-11 (×4): qty 1

## 2016-08-11 MED ORDER — INSULIN GLARGINE 100 UNIT/ML ~~LOC~~ SOLN
70.0000 [IU] | Freq: Two times a day (BID) | SUBCUTANEOUS | Status: DC
  Administered 2016-08-11 – 2016-08-14 (×7): 70 [IU] via SUBCUTANEOUS
  Filled 2016-08-11 (×8): qty 0.7

## 2016-08-11 MED ORDER — ONDANSETRON HCL 4 MG/2ML IJ SOLN: 4.0000 mg | Freq: Four times a day (QID) | INTRAMUSCULAR | Status: DC | PRN

## 2016-08-11 MED ORDER — SODIUM CHLORIDE 0.9% FLUSH
3.0000 mL | Freq: Two times a day (BID) | INTRAVENOUS | Status: DC
  Administered 2016-08-11 – 2016-08-13 (×2): 3 mL via INTRAVENOUS

## 2016-08-11 MED ORDER — ALLOPURINOL 100 MG PO TABS
100.0000 mg | ORAL_TABLET | Freq: Two times a day (BID) | ORAL | Status: DC
  Administered 2016-08-11 – 2016-08-14 (×7): 100 mg via ORAL
  Filled 2016-08-11 (×7): qty 1

## 2016-08-11 MED ORDER — GABAPENTIN 100 MG PO CAPS
100.0000 mg | ORAL_CAPSULE | Freq: Three times a day (TID) | ORAL | Status: DC
  Administered 2016-08-11 – 2016-08-14 (×11): 100 mg via ORAL
  Filled 2016-08-11 (×11): qty 1

## 2016-08-11 MED ORDER — ACETAMINOPHEN 650 MG RE SUPP: 650.0000 mg | Freq: Four times a day (QID) | RECTAL | Status: DC | PRN

## 2016-08-11 MED ORDER — CLOPIDOGREL BISULFATE 75 MG PO TABS
75.0000 mg | ORAL_TABLET | Freq: Every day | ORAL | Status: DC
  Administered 2016-08-11 – 2016-08-14 (×4): 75 mg via ORAL
  Filled 2016-08-11 (×4): qty 1

## 2016-08-11 MED ORDER — SODIUM CHLORIDE 0.9% FLUSH
3.0000 mL | Freq: Two times a day (BID) | INTRAVENOUS | Status: DC
  Administered 2016-08-11 – 2016-08-14 (×5): 3 mL via INTRAVENOUS

## 2016-08-11 MED ORDER — PANTOPRAZOLE SODIUM 40 MG PO TBEC
40.0000 mg | DELAYED_RELEASE_TABLET | Freq: Two times a day (BID) | ORAL | Status: DC
  Administered 2016-08-11 – 2016-08-14 (×8): 40 mg via ORAL
  Filled 2016-08-11 (×8): qty 1

## 2016-08-11 MED ORDER — ADULT MULTIVITAMIN W/MINERALS CH
1.0000 | ORAL_TABLET | Freq: Every day | ORAL | Status: DC
  Administered 2016-08-11 – 2016-08-14 (×4): 1 via ORAL
  Filled 2016-08-11 (×4): qty 1

## 2016-08-11 NOTE — ED Notes (Signed)
Pt ambulatory to the side of the bed with 2 assist to provide a urine sample.

## 2016-08-11 NOTE — H&P (Signed)
History and Physical    TORY SEPTER FAO:[id number] DOB: June 07, 1940 DOA: 08/10/2016  PCP: Burman Blacksmith., MD   Patient coming from: Home  Chief Complaint: Gen weakness  HPI: Isaiah [name] is a 76 y.o. male with medical history significant for hypertension, insulin-dependent diabetes mellitus, history of CVA, CAD with stent, chronic diastolic CHF, and history of DVT and atrial fibrillation on Coumadin, now presenting to the emergency department for evaluation of generalized weakness and shortness of breath which began the day prior. Patient had just been discharged from the hospital on 08/09/2016 after suffering an NSTEMI that was treated with DES to the proximal RCA on 08/07/2016. He was started on Plavix in place of aspirin and was restarted on his Coumadin. Hemoglobin fell from 9.6-7.9 during the hospital admission with no sign of active bleeding. Since returning home, the patient denies any chest pain or palpitations and denies any fevers or chills, but notes progressive generalized weakness and mild dyspnea without cough. He denies tenderness or swelling in the lower extremities. He denies headache, change in vision or hearing, or focal numbness or weakness. He has had a poor appetite since hospital discharge and has not eaten or drinking much at all per his son at the bedside. Patient denies abdominal pain or nausea and has not noted any melena or hematochezia.  ED Course: Upon arrival to the ED, patient is found to be afebrile, saturating low 90s on room air, with blood pressure of 135/49, and normal heart rate of respirations. EKG features and accelerated junctional rhythm with QTC of 559 ms. Chest x-ray is notable for stable cardiomegaly and no acute cardio pulmonary disease. Chemistry panel features a potassium of 3.2 and serum creatinine of 1.61. CBC is notable for a normocytic anemia with hemoglobin of 7.9, stable from time of discharge 2 days prior. Troponin is within the normal limits,  urinalysis is unremarkable, lactic acid is reassuring at 0.91, BNP is mildly elevated to 121.5, and DRE revealed brown stool which is FOBT positive. Patient was more comfortable in the ED after nasal cannula was placed with 2 L/m supplemental oxygen. INR was ordered and remains pending. Patient remained hemodynamically stable and has not been in acute respiratory distress and will be observed on the telemetry unit for ongoing evaluation and management of generalized weakness and dyspnea with occult GI blood-loss.  Review  of Systems:  All other systems reviewed and apart from HPI, are negative.  Past Medical History:  Diagnosis Date  . Atrial fibrillation (HCC)   . CAD (coronary artery disease)   . CHF (congestive heart failure) (HCC)   . Diabetes mellitus   . DVT (deep venous thrombosis) (HCC)   . Fall at home 10/2015  . Hyperlipidemia   . Hypertension   . Left-sided carotid artery disease (HCC)   . Myocardial infarction   . NSTEMI (non-ST elevated myocardial infarction) (HCC)   . Peripheral vascular disease (HCC)   . Stroke (HCC)   . Venous insufficiency     Past Surgical History:  Procedure Laterality Date  . CORONARY ARTERY BYPASS GRAFT  1997   vein harvest from right leg  . CORONARY STENT INTERVENTION N/A 08/07/2016   Procedure: Coronary Stent Intervention;  Surgeon: Corky Crafts, MD;  Location: [facility] INVASIVE CV LAB;  Service: Cardiovascular;  Laterality: N/A;  RCA  . INGUINAL HERNIA REPAIR  1980's   Bilateral,  done in [facility]  . LEFT HEART CATH AND CORS/GRAFTS ANGIOGRAPHY N/A 08/07/2016   Procedure: Left Heart  Cath and Cors/Grafts Angiography;  Surgeon: Corky Crafts, MD;  Location: [facility] INVASIVE CV LAB;  Service: Cardiovascular;  Laterality: N/A;  . PR VEIN BYPASS GRAFT,AORTO-FEM-POP  1980   FEM-FEM BPG by [name]     reports that he quit smoking about 38 years ago. His smoking use included Cigarettes. He has never used smokeless tobacco. He reports that he  does not drink alcohol or use drugs.  No Known Allergies  Family History  Problem Relation Age of Onset  . Other Mother     varicose veins  . Heart disease Mother   . Hyperlipidemia Mother   . Hypertension Mother   . Varicose Veins Mother   . Cancer Father   . Diabetes Father   . Heart disease Father     before age 3  . Hyperlipidemia Father   . Hypertension Father   . Other Father     varicose veins  . Heart attack Father   . Diabetes Daughter   . Hyperlipidemia Daughter   . Hypertension Daughter   . Other Daughter     varicose veins  . Varicose Veins Daughter   . Hypertension Son   . Diabetes Sister   . Heart disease Sister     DVT  . Other Sister     varicose veins  . Hyperlipidemia Sister   . Hypertension Sister   . Varicose Veins Sister   . Other Brother     varicose veins  . Hyperlipidemia Brother   . Hypertension Brother   . Heart attack Brother      Prior to Admission medications   Medication Sig Start Date End Date Taking? Authorizing Provider  allopurinol (ZYLOPRIM) 100 MG tablet TAKE 2 TABLETS BY MOUTH DAILY. Patient taking differently: TAKE 1 TABLET BY MOUTH TWICE DAILY 05/03/14  Yes Runell Gess, MD  clopidogrel (PLAVIX) 75 MG tablet Take 1 tablet (75 mg total) by mouth daily with breakfast. 08/10/16  Yes Maretta Bees, MD  enalapril (VASOTEC) 20 MG tablet Take 10 mg by mouth daily.    Yes Historical Provider, MD  ferrous sulfate 325 (65 FE) MG tablet Take 1 tablet (325 mg total) by mouth 2 (two) times daily with a meal. 08/09/16  Yes Shanker Levora Dredge, MD  furosemide (LASIX) 40 MG tablet Take 40 mg by mouth See admin instructions. Take 1 tablet (40 mg) by mouth twice daily - morning and noon   Yes Historical Provider, MD  gabapentin (NEURONTIN) 100 MG capsule Take 100 mg by mouth 3 (three) times daily.  01/28/13  Yes Historical Provider, MD  glipiZIDE (GLUCOTROL) 5 MG tablet Take 5 mg by mouth 2 (two) times daily before a meal.   Yes Historical  Provider, MD  hydrALAZINE (APRESOLINE) 100 MG tablet Take 100 mg by mouth 2 (two) times daily.   Yes Historical Provider, MD  insulin aspart protamine- aspart (NOVOLOG MIX 70/30) (70-30) 100 UNIT/ML injection Inject 100 Units into the skin 3 (three) times daily before meals.   Yes Historical Provider, MD  isosorbide mononitrate (IMDUR) 60 MG 24 hr tablet Take 1 tablet (60 mg total) by mouth daily. Appointment needed for future refills 05/03/15  Yes Runell Gess, MD  metolazone (ZAROXOLYN) 2.5 MG tablet Take 1 tablet (2.5 mg total) by mouth as needed. Patient taking differently: Take 2.5 mg by mouth as needed (fluid build up/ weight gain).  04/29/14  Yes Runell Gess, MD  Multiple Vitamin (MULTIVITAMIN WITH MINERALS) TABS tablet Take 1  tablet by mouth daily.   Yes Historical Provider, MD  pantoprazole (PROTONIX) 40 MG tablet Take 40 mg by mouth 2 (two) times daily before a meal.    Yes Historical Provider, MD  pravastatin (PRAVACHOL) 40 MG tablet Take 40 mg by mouth at bedtime.    Yes Historical Provider, MD  PRESCRIPTION MEDICATION Inhale into the lungs at bedtime. CPAP   Yes Historical Provider, MD  Psyllium (METAMUCIL PO) Take 2 capsules by mouth at bedtime.   Yes Historical Provider, MD  QUEtiapine (SEROQUEL) 50 MG tablet Take 50 mg by mouth daily with supper.   Yes Historical Provider, MD  traMADol (ULTRAM) 50 MG tablet Take 50-100 mg by mouth 3 (three) times daily as needed (pain).  08/30/15  Yes Historical Provider, MD  warfarin (COUMADIN) 4 MG tablet Take 4 mg by mouth daily with supper.   Yes Historical Provider, MD    Physical Exam: Vitals:   08/11/16 0345 08/11/16 0409 08/11/16 0430 08/11/16 0500  BP:  (!) 148/53 (!) 151/62 (!) 161/65  Pulse: 78 79 74 71  Resp: 15 (!) 21 (!) 22 18  Temp:      TempSrc:      SpO2: 99% 97% 97% 95%      Constitutional: NAD, calm, comfortable. Obese, chronically-ill appearing  Eyes: PERTLA, lids and conjunctivae normal ENMT: Mucous  membranes are moist. Posterior pharynx clear of any exudate or lesions.   Neck: normal, supple, no masses, no thyromegaly Respiratory: clear to auscultation bilaterally, no wheezing, no crackles. Normal respiratory effort. Mild dyspnea with speech.    Cardiovascular: S1 & S2 heard, regular rate and rhythm. No extremity edema. No significant JVD. Abdomen: No distension, no tenderness, no masses palpated. Bowel sounds normal.  Musculoskeletal: no clubbing / cyanosis. No joint deformity upper and lower extremities. Normal muscle tone.  Skin: no significant rashes, lesions, ulcers. Warm, dry, well-perfused. Neurologic: CN 2-12 grossly intact. Sensation intact, DTR normal. Strength 5/5 in all 4 limbs. Mild dysarthria. Psychiatric: Alert and oriented x 3. Normal mood and affect.     Labs on Admission: I have personally reviewed following labs and imaging studies  CBC:  Recent Labs Lab 08/05/16 1925 08/07/16 0526 08/08/16 0144 08/09/16 0338 08/10/16 2323  WBC 10.4 8.6 10.8* 10.4 9.3  NEUTROABS 7.1  --   --   --  5.4  HGB 9.6* 8.4* 8.7* 7.9* 7.9*  HCT 31.9* 28.9* 29.8* 27.4* 26.8*  MCV 94.4 95.4 95.2 93.8 94.0  PLT 259 220 248 229 226   Basic Metabolic Panel:  Recent Labs Lab 08/05/16 1925 08/07/16 0526 08/08/16 0144 08/10/16 2323  NA 136 137 136 143  K 3.7 3.9 4.0 3.2*  CL 97* 98* 98* 102  CO2 GLUCOSE 242* 232* 228* 145*  BUN CREATININE 1.67* 1.57* 1.20 1.61*  CALCIUM 8.6* 8.7* 8.9 8.6*   GFR: Estimated Creatinine Clearance: 49.2 mL/min (A) (by C-G formula based on SCr of 1.61 mg/dL (H)). Liver Function Tests:  Recent Labs Lab 08/05/16 1925 08/10/16 2323  AST 26 22  ALT 16* 13*  ALKPHOS 67 53  BILITOT 0.5 0.4  PROT 7.8 6.7  ALBUMIN 3.6 3.2*   No results for input(s): LIPASE, AMYLASE in the last 168 hours. No results for input(s): AMMONIA in the last 168 hours. Coagulation Profile:  Recent Labs Lab 08/06/16 1821 08/07/16 0526  08/07/16 1054 08/08/16 0144 08/09/16 0338  INR 1.96 1.86 1.63 1.56 1.32   Cardiac Enzymes:  Recent Labs Lab 08/06/16 0016 08/06/16 0623 08/06/16 1128  TROPONINI 0.03* 0.03* 0.03*   BNP (last 3 results) No results for input(s): PROBNP in the last 8760 hours. HbA1C: No results for input(s): HGBA1C in the last 72 hours. CBG:  Recent Labs Lab 08/08/16 1755 08/08/16 2053 08/09/16 0607 08/09/16 1119 08/09/16 1746  GLUCAP 180* 186* 207* 226* 221*   Lipid Profile: No results for input(s): CHOL, HDL, LDLCALC, TRIG, CHOLHDL, LDLDIRECT in the last 72 hours. Thyroid Function Tests: No results for input(s): TSH, T4TOTAL, FREET4, T3FREE, THYROIDAB in the last 72 hours. Anemia Panel: No results for input(s): VITAMINB12, FOLATE, FERRITIN, TIBC, IRON, RETICCTPCT in the last 72 hours. Urine analysis:    Component Value Date/Time   COLORURINE YELLOW 08/11/2016 0219   APPEARANCEUR CLEAR 08/11/2016 0219   LABSPEC 1.013 08/11/2016 0219   PHURINE 5.0 08/11/2016 0219   GLUCOSEU NEGATIVE 08/11/2016 0219   HGBUR NEGATIVE 08/11/2016 0219   BILIRUBINUR NEGATIVE 08/11/2016 0219   KETONESUR NEGATIVE 08/11/2016 0219   PROTEINUR NEGATIVE 08/11/2016 0219   NITRITE NEGATIVE 08/11/2016 0219   LEUKOCYTESUR NEGATIVE 08/11/2016 0219   Sepsis Labs: (procalcitonin:4,lacticidven:4) ) Recent Results (from the past 240 hour(s))  MRSA PCR Screening     Status: Abnormal   Collection Time: 08/06/16 12:20 AM  Result Value Ref Range Status   MRSA by PCR POSITIVE (A) NEGATIVE Final    Comment:        The GeneXpert MRSA Assay (FDA approved for NASAL specimens only), is one component of a comprehensive MRSA colonization surveillance program. It is not intended to diagnose MRSA infection nor to guide or monitor treatment for MRSA infections. RESULT CALLED TO, READ BACK BY AND VERIFIED WITH: HITT,L RN [phone] 08/06/16 MITCHELL,L      Radiological Exams on Admission: Dg Chest Port 1  View  Result Date: 08/10/2016 CLINICAL DATA:  Shortness of breath, weakness. EXAM: PORTABLE CHEST 1 VIEW COMPARISON:  Radiographs 5 days prior 08/05/2016 FINDINGS: Stable cardiomegaly, post median sternotomy and CABG. Atherosclerosis of the thoracic aorta. Trace chronic blunting of the costophrenic angles. No pulmonary edema. No focal airspace disease. No pneumothorax. Multiple overlying monitoring devices project over the lower chest. IMPRESSION: Stable cardiomegaly post CABG.  Thoracic aortic atherosclerosis. No acute abnormality. Electronically Signed   By: Rubye Oaks M.D.   On: 08/10/2016 23:15    EKG: Independently reviewed. Accelerated junctional rhythm, QTc 559  Assessment/Plan  1. Generalized weakness, DOE  - Pt presents with gen weakness, worsening since leaving the hospital and associated with DOE  - No focal definite focal deficit, but some mild dysarthria noted  - CXR with no acute cardiopulmonary disease; no fever or leukocytosis  - Anemia has worsened recently as discussed below, but Hgb same as on recent discharge  - Plan to check head CT given subtle dysarthria, treat supportively for now  - PT eval requested    2. Normocytic anemia, occult GIB  - Hgb is 7.9 on admission, stable from time of recent discharge on 3/29, but down from 9.6 on 3/25 - There is no gross bleeding, no abd pain, and no nausea - FOBT is positive; INR remains pending; type and screen has been performed  - Plan to hold warfarin for now, continue Plavix given recent DES, follow H&H, continue Protonix BID    3. Coronary artery disease  - No anginal complaints on arrival, troponin 0.01, no acute ischemic features on EKG  - Had DES placed to proximal RCA on 08/07/16  - Continue Plavix  and statin; beta-blocker discontinued during recent admission for bradycardia pending cardiology follow-up  - He is being monitored on telemetry   4. Chronic atrial fibrillation  - CHADS-VASc at least 52 (age x2, CVA x2,  CAD, DM)  - Coumadin currently held for anemia with occult GIB  - Rate is well-controlled; beta-blocker recently placed on hold for bradycardia  5. Insulin-dependent DM  - A1c was 10.5% in September 2014  - Managed at home with Novolog 70/30 100 units TID, plus glipizide  - Check CBG with meals and qHS  - Start Lantus 75 units BID with 10 units Novolog with meals and a high-intensity correctional   6. Hypertension  - Diastolic pressures soft initially  - Enalapril held on admission given soft DBP and bump in SCr    7. CKD stage III  - SCr is 1.61 on admission, up from 1.2 a couple days prior, but all other recent values are in 1.6-range  - Avoid nephrotoxins where possible, renally-dose medications, repeat chemistries tomorrow    8. Hypokalemia  - Serum potassium is 3.2 on admission with prolonged QTc  - 40 mEq oral potassium given on admission - Monitor on telemetry, repeat chem panel tomorrow    10. Chronic diastolic CHF  - Appears stable on admission; wt stable, no peripheral edema  - Lasix and Zaroxolyn used at home, currently held given soft DBP  - Follow daily wts and I/O's, SLIV    DVT prophylaxis: SCD's  Code Status: Full  Family Communication: Son updated at bedside Disposition Plan: Observe on telemetry Consults called: None Admission status: Observation    Briscoe Deutscher, MD [facility] Pager [phone]  If 7PM-7AM, please contact night-coverage [email or web address] Password [facility]  08/11/2016, 5:53 AM

## 2016-08-11 NOTE — Progress Notes (Signed)
Pt placed on cpap 10cmh20 full face mask with 2lpm 02 titrated into mask. Pt is comfortable.

## 2016-08-11 NOTE — Progress Notes (Signed)
Pt arrived to floor with tech and son in law. He's alert and orient and is free of pain. He's resting in bed with call bell with in reach. Will continue to monitor pt.

## 2016-08-11 NOTE — Consult Note (Signed)
Joes Reason for consult: anemia, stool positive for FOB Referring Physician: [facility] hospitalist. PCP: Dr. Sarah [name]. Primary G.I.: patient unassigned  Isaiah [name] is an 76 y.o. male.  HPI: patient had been in the hospital and was discharged 3/29 after an NSTEMI treated with the drug eluting stent to the proximal RCA by cardiac 3/27. He Had Been on Coumadin and This Was Restarted and Was Placed on Plavix. His Hemoglobin Had Fallen from 9.6 to 7.9 without Active Bleeding. He [facility] and since His Hospital Discharge Had Problems Eating Was Feeling Quite Weak. He Had No Abdominal Pain or Nausea. He States That He Has Minimal Heartburn Take Stones Occasionally. Denies the Use of Any NSAIDs. Presented Back to the ER with the Symptoms INR Was Actually Still Elevated Just a Little Stools Were Positive in Hemoglobin Was Still. The patient tells me that he has never had EGD or colonoscopy. He's had a history of fairly significant CAD and CHF. Set a history of diabetes DVT and strokes as well as vascular disease.  Past Medical History:  Diagnosis Date  . Atrial fibrillation ([place])   . CAD (coronary artery disease)   . CHF (congestive heart failure) ([place])   . Diabetes mellitus   . DVT (deep venous thrombosis) ([place])   . Fall at home 10/2015  . Hyperlipidemia   . Hypertension   . Left-sided carotid artery disease ([place])   . Myocardial infarction   . NSTEMI (non-ST elevated myocardial infarction) ([place])   . Peripheral vascular disease ([place])   . Stroke ([place])   . Venous insufficiency     Past Surgical History:  Procedure Laterality Date  . CORONARY ARTERY BYPASS GRAFT  1997   vein harvest from right leg  . CORONARY STENT INTERVENTION N/A 08/07/2016   Procedure: Coronary Stent Intervention;  Surgeon: Jettie Booze, MD;  Location: [facility];  Service: Cardiovascular;  Laterality: N/A;  RCA  . INGUINAL HERNIA REPAIR  1980's   Bilateral,  done in [facility]   . LEFT HEART CATH AND CORS/GRAFTS ANGIOGRAPHY N/A 08/07/2016   Procedure: Left Heart Cath and Cors/Grafts Angiography;  Surgeon: Jettie Booze, MD;  Location: [facility];  Service: Cardiovascular;  Laterality: N/A;  . PR VEIN BYPASS GRAFT,AORTO-FEM-POP  1980   FEM-FEM BPG by Dr. Deon Pilling    Family History  Problem Relation Age of Onset  . Other Mother     varicose veins  . Heart disease Mother   . Hyperlipidemia Mother   . Hypertension Mother   . Varicose Veins Mother   . Cancer Father   . Diabetes Father   . Heart disease Father     before age 72  . Hyperlipidemia Father   . Hypertension Father   . Other Father     varicose veins  . Heart attack Father   . Diabetes Daughter   . Hyperlipidemia Daughter   . Hypertension Daughter   . Other Daughter     varicose veins  . Varicose Veins Daughter   . Hypertension Son   . Diabetes Sister   . Heart disease Sister     DVT  . Other Sister     varicose veins  . Hyperlipidemia Sister   . Hypertension Sister   . Varicose Veins Sister   . Other Brother     varicose veins  . Hyperlipidemia Brother   . Hypertension Brother   . Heart attack Brother     Social History:  reports that he quit smoking about 38 years ago. His smoking use included Cigarettes. He has never used smokeless tobacco. He reports that he does not drink alcohol or use drugs.  Allergies: No Known Allergies  Medications; Prior to Admission medications   Medication Sig Start Date End Date Taking? Authorizing Provider  allopurinol (ZYLOPRIM) 100 MG tablet TAKE 2 TABLETS BY MOUTH DAILY. Patient taking differently: TAKE 1 TABLET BY MOUTH TWICE DAILY 05/03/14  Yes Lorretta Harp, MD  clopidogrel (PLAVIX) 75 MG tablet Take 1 tablet (75 mg total) by mouth daily with breakfast. 08/10/16  Yes Jonetta Osgood, MD  enalapril (VASOTEC) 20 MG tablet Take 10 mg by mouth daily.    Yes Historical Provider, MD  ferrous sulfate 325 (65 FE) MG tablet Take 1  tablet (325 mg total) by mouth 2 (two) times daily with a meal. 08/09/16  Yes Shanker Kristeen Mans, MD  furosemide (LASIX) 40 MG tablet Take 40 mg by mouth See admin instructions. Take 1 tablet (40 mg) by mouth twice daily - morning and noon   Yes Historical Provider, MD  gabapentin (NEURONTIN) 100 MG capsule Take 100 mg by mouth 3 (three) times daily.  01/28/13  Yes Historical Provider, MD  glipiZIDE (GLUCOTROL) 5 MG tablet Take 5 mg by mouth 2 (two) times daily before a meal.   Yes Historical Provider, MD  hydrALAZINE (APRESOLINE) 100 MG tablet Take 100 mg by mouth 2 (two) times daily.   Yes Historical Provider, MD  insulin aspart protamine- aspart (NOVOLOG MIX 70/30) (70-30) 100 UNIT/ML injection Inject 100 Units into the skin 3 (three) times daily before meals.   Yes Historical Provider, MD  isosorbide mononitrate (IMDUR) 60 MG 24 hr tablet Take 1 tablet (60 mg total) by mouth daily. Appointment needed for future refills 05/03/15  Yes Lorretta Harp, MD  metolazone (ZAROXOLYN) 2.5 MG tablet Take 1 tablet (2.5 mg total) by mouth as needed. Patient taking differently: Take 2.5 mg by mouth as needed (fluid build up/ weight gain).  04/29/14  Yes Lorretta Harp, MD  Multiple Vitamin (MULTIVITAMIN WITH MINERALS) TABS tablet Take 1 tablet by mouth daily.   Yes Historical Provider, MD  pantoprazole (PROTONIX) 40 MG tablet Take 40 mg by mouth 2 (two) times daily before a meal.    Yes Historical Provider, MD  pravastatin (PRAVACHOL) 40 MG tablet Take 40 mg by mouth at bedtime.    Yes Historical Provider, MD  PRESCRIPTION MEDICATION Inhale into the lungs at bedtime. CPAP   Yes Historical Provider, MD  Psyllium (METAMUCIL PO) Take 2 capsules by mouth at bedtime.   Yes Historical Provider, MD  QUEtiapine (SEROQUEL) 50 MG tablet Take 50 mg by mouth daily with supper.   Yes Historical Provider, MD  traMADol (ULTRAM) 50 MG tablet Take 50-100 mg by mouth 3 (three) times daily as needed (pain).  08/30/15  Yes  Historical Provider, MD  warfarin (COUMADIN) 4 MG tablet Take 4 mg by mouth daily with supper.   Yes Historical Provider, MD   . allopurinol  100 mg Oral BID  . clopidogrel  75 mg Oral Daily  . furosemide  60 mg Intravenous BID  . gabapentin  100 mg Oral TID  . hydrALAZINE  100 mg Oral BID  . insulin aspart  0-20 Units Subcutaneous TID WC  . insulin aspart  0-5 Units Subcutaneous QHS  . insulin aspart  10 Units Subcutaneous TID WC  . insulin glargine  70 Units Subcutaneous BID  .  isosorbide mononitrate  60 mg Oral Daily  . metolazone  2.5 mg Oral Daily  . multivitamin with minerals  1 tablet Oral Daily  . pantoprazole  40 mg Oral BID AC  . [START ON 08/12/2016] pneumococcal 23 valent vaccine  0.5 mL Intramuscular Tomorrow-1000  . pravastatin  40 mg Oral QHS  . QUEtiapine  50 mg Oral Q supper  . sodium chloride flush  3 mL Intravenous Q12H  . sodium chloride flush  3 mL Intravenous Q12H   PRN Meds sodium chloride, acetaminophen **OR** acetaminophen, ondansetron **OR** ondansetron (ZOFRAN) IV, sodium chloride flush, traMADol Results for orders placed or performed during the hospital encounter of 08/10/16 (from the past 48 hour(s))  CBC with Differential/Platelet     Status: Abnormal   Collection Time: 08/10/16 11:23 PM  Result Value Ref Range   WBC 9.3 4.0 - 10.5 K/uL   RBC 2.85 (L) 4.22 - 5.81 MIL/uL   Hemoglobin 7.9 (L) 13.0 - 17.0 g/dL   HCT 26.8 (L) 39.0 - 52.0 %   MCV 94.0 78.0 - 100.0 fL   MCH 27.7 26.0 - 34.0 pg   MCHC 29.5 (L) 30.0 - 36.0 g/dL   RDW 15.3 11.5 - 15.5 %   Platelets 226 150 - 400 K/uL   Neutrophils Relative % 57 %   Neutro Abs 5.4 1.7 - 7.7 K/uL   Lymphocytes Relative 30 %   Lymphs Abs 2.8 0.7 - 4.0 K/uL   Monocytes Relative 8 %   Monocytes Absolute 0.7 0.1 - 1.0 K/uL   Eosinophils Relative 4 %   Eosinophils Absolute 0.4 0.0 - 0.7 K/uL   Basophils Relative 1 %   Basophils Absolute 0.1 0.0 - 0.1 K/uL  Comprehensive metabolic panel     Status: Abnormal    Collection Time: 08/10/16 11:23 PM  Result Value Ref Range   Sodium 143 135 - 145 mmol/L   Potassium 3.2 (L) 3.5 - 5.1 mmol/L   Chloride 102 101 - 111 mmol/L   CO2 31 22 - 32 mmol/L   Glucose, Bld 145 (H) 65 - 99 mg/dL   BUN 19 6 - 20 mg/dL   Creatinine, Ser 1.61 (H) 0.61 - 1.24 mg/dL   Calcium 8.6 (L) 8.9 - 10.3 mg/dL   Total Protein 6.7 6.5 - 8.1 g/dL   Albumin 3.2 (L) 3.5 - 5.0 g/dL   AST 22 15 - 41 U/L   ALT 13 (L) 17 - 63 U/L   Alkaline Phosphatase 53 38 - 126 U/L   Total Bilirubin 0.4 0.3 - 1.2 mg/dL   GFR calc non Af Amer 40 (L) >60 mL/min   GFR calc Af Amer 47 (L) >60 mL/min    Comment: (NOTE) The eGFR has been calculated using the CKD EPI equation. This calculation has not been validated in all clinical situations. eGFR's persistently <60 mL/min signify possible Chronic Kidney Disease.    Anion gap 10 5 - 15  Brain natriuretic peptide     Status: Abnormal   Collection Time: 08/10/16 11:23 PM  Result Value Ref Range   B Natriuretic Peptide 121.5 (H) 0.0 - 100.0 pg/mL  I-stat troponin, ED     Status: None   Collection Time: 08/10/16 11:34 PM  Result Value Ref Range   Troponin i, poc 0.01 0.00 - 0.08 ng/mL   Comment 3            Comment: Due to the release kinetics of cTnI, a negative result within the first hours of  the onset of symptoms does not rule out myocardial infarction with certainty. If myocardial infarction is still suspected, repeat the test at appropriate intervals.   I-Stat CG4 Lactic Acid, ED     Status: None   Collection Time: 08/10/16 11:36 PM  Result Value Ref Range   Lactic Acid, Venous 0.91 0.5 - 1.9 mmol/L  Urinalysis, Routine w reflex microscopic     Status: None   Collection Time: 08/11/16  2:19 AM  Result Value Ref Range   Color, Urine YELLOW YELLOW   APPearance CLEAR CLEAR   Specific Gravity, Urine 1.013 1.005 - 1.030   pH 5.0 5.0 - 8.0   Glucose, UA NEGATIVE NEGATIVE mg/dL   Hgb urine dipstick NEGATIVE NEGATIVE   Bilirubin Urine  NEGATIVE NEGATIVE   Ketones, ur NEGATIVE NEGATIVE mg/dL   Protein, ur NEGATIVE NEGATIVE mg/dL   Nitrite NEGATIVE NEGATIVE   Leukocytes, UA NEGATIVE NEGATIVE  POC occult blood, ED Provider will collect     Status: Abnormal   Collection Time: 08/11/16  4:10 AM  Result Value Ref Range   Fecal Occult Bld POSITIVE (A) NEGATIVE  Type and screen     Status: None   Collection Time: 08/11/16  5:00 AM  Result Value Ref Range   ABO/RH(D) A POS    Antibody Screen NEG    Sample Expiration 08/14/2016   Protime-INR     Status: Abnormal   Collection Time: 08/11/16  5:00 AM  Result Value Ref Range   Prothrombin Time 16.8 (H) 11.4 - 15.2 seconds   INR 1.35   ABO/Rh     Status: None   Collection Time: 08/11/16  5:00 AM  Result Value Ref Range   ABO/RH(D) A POS   TSH     Status: None   Collection Time: 08/11/16  7:57 AM  Result Value Ref Range   TSH 2.504 0.350 - 4.500 uIU/mL    Comment: Performed by a 3rd Generation assay with a functional sensitivity of <=0.01 uIU/mL.  Glucose, capillary     Status: Abnormal   Collection Time: 08/11/16  8:22 AM  Result Value Ref Range   Glucose-Capillary 175 (H) 65 - 99 mg/dL  Glucose, capillary     Status: Abnormal   Collection Time: 08/11/16 12:11 PM  Result Value Ref Range   Glucose-Capillary 203 (H) 65 - 99 mg/dL  Troponin I     Status: None   Collection Time: 08/11/16  1:10 PM  Result Value Ref Range   Troponin I <0.03 <0.03 ng/mL  Glucose, capillary     Status: Abnormal   Collection Time: 08/11/16  4:25 PM  Result Value Ref Range   Glucose-Capillary 209 (H) 65 - 99 mg/dL    Ct Head Wo Contrast  Result Date: 08/11/2016 CLINICAL DATA:  Weakness. EXAM: CT HEAD WITHOUT CONTRAST TECHNIQUE: Contiguous axial images were obtained from the base of the skull through the vertex without intravenous contrast. COMPARISON:  Head CT 4 days prior 08/08/2015 FINDINGS: Brain: Stable atrophy and chronic small vessel ischemia. Remote lacunar infarct in left basal  ganglia unchanged. No CT findings of acute ischemia. No hemorrhage, mass effect or midline shift. No hydrocephalus. Incidental note of dural calcifications. Vascular: Atherosclerosis of skullbase vasculature without hyperdense vessel or abnormal calcification. Skull: Normal. Negative for fracture or focal lesion. Sinuses/Orbits: Minimal mucosal thickening of left sphenoid sinus. No fluid levels. Trace opacification of lower left mastoid air cells, unchanged. Other: None. IMPRESSION: No acute intracranial abnormality. Stable atrophy and chronic small vessel ischemia.  Electronically Signed   By: Jeb Levering M.D.   On: 08/11/2016 06:48   Dg Chest Port 1 View  Result Date: 08/10/2016 CLINICAL DATA:  Shortness of breath, weakness. EXAM: PORTABLE CHEST 1 VIEW COMPARISON:  Radiographs 5 days prior 08/05/2016 FINDINGS: Stable cardiomegaly, post median sternotomy and CABG. Atherosclerosis of the thoracic aorta. Trace chronic blunting of the costophrenic angles. No pulmonary edema. No focal airspace disease. No pneumothorax. Multiple overlying monitoring devices project over the lower chest. IMPRESSION: Stable cardiomegaly post CABG.  Thoracic aortic atherosclerosis. No acute abnormality. Electronically Signed   By: Jeb Levering M.D.   On: 08/10/2016 23:15               Blood pressure (!) 156/70, pulse 74, temperature 97.6 F (36.4 C), temperature source Oral, resp. rate 20, height _0  (1.702 m), weight 111.2 kg (245 lb 3.2 oz), SpO2 97 %.  Physical exam:   General-- morbidly obese white male ENT-- nonicteric Neck-- very thick neck Heart-- regular rate and rhythm without murmurs are gallops Lungs-- clear  Abdomen-- morbidly obese soft and nontender Psych-- oriented but appears to be somewhat mentally slow   Assessment: 1. Positive stool/anemia. Source unclear. Patient does need to be anticoagulated so it's important we try to determine the source. He's never had EGD colonoscopy. I  think an EGD would be reasonable. 2. Afib chronically anticoagulated 3. History of CAD, CHF 4. Diabetes 5. CKD stage III 6. History of DVT and stroke  Plan: 1. We will plan on EGD in the a.m. just to rule out actively bleeding ulcer or upper G.I. pathology. Have discussed this in detail with the patient and his son.    Diasia Henken JR,Devian Bartolomei L 08/11/2016, 4:46 PM   This note was created using voice recognition software and minor errors may Have occurred unintentionally. Pager: [phone] If no answer or after hours call [phone]

## 2016-08-11 NOTE — Progress Notes (Signed)
PROGRESS NOTE  Isaiah [name]  WUJ:[id number] DOB: [birth year] DOA: 08/10/2016 PCP: Burman Blacksmith., MD Outpatient Specialists:  Subjective: Seen with his son-in-law at bedside, denies chest pain this morning. No other complaints.  Brief Narrative:  Isaiah [name] is a 76 y.o. male with medical history significant for hypertension, insulin-dependent diabetes mellitus, history of CVA, CAD with stent, chronic diastolic CHF, and history of DVT and atrial fibrillation on Coumadin, now presenting to the ED for evaluation of generalized weakness and shortness of breath which began the day prior. Patient had just been discharged from the hospital on 08/09/2016 after suffering an NSTEMI that was treated with DES to the proximal RCA on 08/07/2016. He was started on Plavix in place of aspirin and was restarted on his Coumadin. Hemoglobin fell from 9.6-7.9 during the hospital admission with no sign of active bleeding. Since returning home, the patient denies any chest pain or palpitations and denies any fevers or chills, but notes progressive generalized weakness and mild dyspnea without cough. He denies tenderness or swelling in the lower extremities. He denies headache, change in vision or hearing, or focal numbness or weakness. He has had a poor appetite since hospital discharge and has not eaten or drinking much at all per his son at the bedside. Patient denies abdominal pain or nausea and has not noted any melena or hematochezia.  Assessment & Plan:   Principal Problem:   Occult GI bleeding Active Problems:   Coronary artery disease   Chronic atrial fibrillation (HCC)   Essential hypertension   Type 2 diabetes mellitus with vascular disease (HCC)   Obstructive sleep apnea   CKD (chronic kidney disease), stage III   Hypokalemia   General weakness   GI bleed   Weakness   Patient admitted earlier today by my colleague Dr. Antionette Char. This is a no charge note. Came in with generalized weakness, appears  to be close to baseline. Will give extra dose of IV Lasix to ensure negative intake/output.  Generalized weakness, DOE  - Pt presents with gen weakness, worsening since leaving the hospital and associated with DOE  - No focal definite focal deficit, but some mild dysarthria noted  - CXR with no acute cardiopulmonary disease; no fever or leukocytosis  - Anemia has worsened recently as discussed below, but Hgb same as on recent discharge  - Plan to check head CT given subtle dysarthria, treat supportively for now  - PT eval requested    Normocytic anemia, occult GIB  - Hgb is 7.9 on admission, stable from time of recent discharge on 3/29, but down from 9.6 on 3/25 - There is no gross bleeding, no abd pain, and no nausea - FOBT is positive, INR is only 1.3. - Not stop Plavix, stent placed on 08/07/2016. - Iron studies showed iron deficiency anemia, likely chronic bleeding. - GI to evaluate.  Coronary artery disease  - No anginal complaints on arrival, troponin 0.01, no acute ischemic features on EKG  - Had DES placed to proximal RCA on 08/07/16  - Continue Plavix and statin; beta-blocker discontinued during recent admission for bradycardia pending cardiology follow-up  - He is being monitored on telemetry   Chronic atrial fibrillation  - CHADS-VASc at least 48 (age x2, CVA x2, CAD, DM)  - Coumadin currently held for anemia with occult GIB  - Rate is well-controlled; beta-blocker recently placed on hold for bradycardia  Insulin-dependent DM  - A1c was 10.5% in September 2014  - Managed at home with Novolog 70/30  100 units TID, plus glipizide  - Check CBG with meals and qHS  - Start Lantus 75 units BID with 10 units Novolog with meals and a high-intensity correctional   Hypertension  - Diastolic pressures soft initially  - Enalapril held on admission given soft DBP and bump in SCr    CKD stage III  - SCr is 1.61 on admission, up from 1.2 a couple days prior, but all other  recent values are in 1.6-range  - Avoid nephrotoxins where possible, renally-dose medications, repeat chemistries tomorrow    Hypokalemia  - Serum potassium is 3.2 on admission with prolonged QTc  - 40 mEq oral potassium given on admission - Monitor on telemetry, repeat chem panel tomorrow    Chronic diastolic CHF  - Appears stable on admission; wt stable, no peripheral edema  - Lasix and Zaroxolyn used at home, currently held given soft DBP  - Follow daily wts and I/O's, SLIV    DVT prophylaxis:  Code Status: Full Code Family Communication:  Disposition Plan:  Diet: Diet Heart Room service appropriate? Yes; Fluid consistency: Thin; Fluid restriction: 1500 mL Fluid  Consultants:   Cards  GI  Procedures:   None  Antimicrobials:   None  Objective: Vitals:   08/11/16 0409 08/11/16 0430 08/11/16 0500 08/11/16 0601  BP: (!) 148/53 (!) 151/62 (!) 161/65 (!) 156/70  Pulse: 79 74 71 74  Resp: (!) 21 (!) Temp:    97.6 F (36.4 C)  TempSrc:    Oral  SpO2: 97% 97% 95% 97%  Weight:    111.2 kg (245 lb 3.2 oz)  Height:     (1.702 m)   No intake or output data in the 24 hours ending 08/11/16 1226 Filed Weights   08/11/16 0601  Weight: 111.2 kg (245 lb 3.2 oz)    Examination: General exam: Appears calm and comfortable  Respiratory system: Clear to auscultation. Respiratory effort normal. Cardiovascular system: S1 & S2 heard, RRR. No JVD, murmurs, rubs, gallops or clicks. No pedal edema. Gastrointestinal system: Abdomen is nondistended, soft and nontender. No organomegaly or masses felt. Normal bowel sounds heard. Central nervous system: Alert and oriented. No focal neurological deficits. Extremities: Symmetric 5 x 5 power. Skin: No rashes, lesions or ulcers Psychiatry: Judgement and insight appear normal. Mood & affect appropriate.   Data Reviewed: I have personally reviewed following labs and imaging studies  CBC:  Recent Labs Lab 08/05/16 1925  08/07/16 0526 08/08/16 0144 08/09/16 0338 08/10/16 2323  WBC 10.4 8.6 10.8* 10.4 9.3  NEUTROABS 7.1  --   --   --  5.4  HGB 9.6* 8.4* 8.7* 7.9* 7.9*  HCT 31.9* 28.9* 29.8* 27.4* 26.8*  MCV 94.4 95.4 95.2 93.8 94.0  PLT 259 220 248 229 226   Basic Metabolic Panel:  Recent Labs Lab 08/05/16 1925 08/07/16 0526 08/08/16 0144 08/10/16 2323  NA 136 137 136 143  K 3.7 3.9 4.0 3.2*  CL 97* 98* 98* 102  CO2 GLUCOSE 242* 232* 228* 145*  BUN CREATININE 1.67* 1.57* 1.20 1.61*  CALCIUM 8.6* 8.7* 8.9 8.6*   GFR: Estimated Creatinine Clearance: 47.2 mL/min (A) (by C-G formula based on SCr of 1.61 mg/dL (H)). Liver Function Tests:  Recent Labs Lab 08/05/16 1925 08/10/16 2323  AST 26 22  ALT 16* 13*  ALKPHOS 67 53  BILITOT 0.5 0.4  PROT 7.8 6.7  ALBUMIN 3.6 3.2*  No results for input(s): LIPASE, AMYLASE in the last 168 hours. No results for input(s): AMMONIA in the last 168 hours. Coagulation Profile:  Recent Labs Lab 08/07/16 0526 08/07/16 1054 08/08/16 0144 08/09/16 0338 08/11/16 0500  INR 1.86 1.63 1.56 1.32 1.35   Cardiac Enzymes:  Recent Labs Lab 08/06/16 0016 08/06/16 0623 08/06/16 1128  TROPONINI 0.03* 0.03* 0.03*   BNP (last 3 results) No results for input(s): PROBNP in the last 8760 hours. HbA1C: No results for input(s): HGBA1C in the last 72 hours. CBG:  Recent Labs Lab 08/08/16 2053 08/09/16 0607 08/09/16 1119 08/09/16 1746 08/11/16 0822  GLUCAP 186* 207* 226* 221* 175*   Lipid Profile: No results for input(s): CHOL, HDL, LDLCALC, TRIG, CHOLHDL, LDLDIRECT in the last 72 hours. Thyroid Function Tests:  Recent Labs  08/11/16 0757  TSH 2.504   Anemia Panel: No results for input(s): VITAMINB12, FOLATE, FERRITIN, TIBC, IRON, RETICCTPCT in the last 72 hours. Urine analysis:    Component Value Date/Time   COLORURINE YELLOW 08/11/2016 0219   APPEARANCEUR CLEAR 08/11/2016 0219   LABSPEC 1.013 08/11/2016 0219     PHURINE 5.0 08/11/2016 0219   GLUCOSEU NEGATIVE 08/11/2016 0219   HGBUR NEGATIVE 08/11/2016 0219   BILIRUBINUR NEGATIVE 08/11/2016 0219   KETONESUR NEGATIVE 08/11/2016 0219   PROTEINUR NEGATIVE 08/11/2016 0219   NITRITE NEGATIVE 08/11/2016 0219   LEUKOCYTESUR NEGATIVE 08/11/2016 0219   Sepsis Labs: (procalcitonin:4,lacticidven:4)  ) Recent Results (from the past 240 hour(s))  MRSA PCR Screening     Status: Abnormal   Collection Time: 08/06/16 12:20 AM  Result Value Ref Range Status   MRSA by PCR POSITIVE (A) NEGATIVE Final    Comment:        The GeneXpert MRSA Assay (FDA approved for NASAL specimens only), is one component of a comprehensive MRSA colonization surveillance program. It is not intended to diagnose MRSA infection nor to guide or monitor treatment for MRSA infections. RESULT CALLED TO, READ BACK BY AND VERIFIED WITH: HITT,L RN [phone] 08/06/16 MITCHELL,L      Invalid input(s): PROCALCITONIN, LACTICACIDVEN   Radiology Studies: Ct Head Wo Contrast  Result Date: 08/11/2016 CLINICAL DATA:  Weakness. EXAM: CT HEAD WITHOUT CONTRAST TECHNIQUE: Contiguous axial images were obtained from the base of the skull through the vertex without intravenous contrast. COMPARISON:  Head CT 4 days prior 08/08/2015 FINDINGS: Brain: Stable atrophy and chronic small vessel ischemia. Remote lacunar infarct in left basal ganglia unchanged. No CT findings of acute ischemia. No hemorrhage, mass effect or midline shift. No hydrocephalus. Incidental note of dural calcifications. Vascular: Atherosclerosis of skullbase vasculature without hyperdense vessel or abnormal calcification. Skull: Normal. Negative for fracture or focal lesion. Sinuses/Orbits: Minimal mucosal thickening of left sphenoid sinus. No fluid levels. Trace opacification of lower left mastoid air cells, unchanged. Other: None. IMPRESSION: No acute intracranial abnormality. Stable atrophy and chronic small vessel ischemia.  Electronically Signed   By: Rubye Oaks M.D.   On: 08/11/2016 06:48   Dg Chest Port 1 View  Result Date: 08/10/2016 CLINICAL DATA:  Shortness of breath, weakness. EXAM: PORTABLE CHEST 1 VIEW COMPARISON:  Radiographs 5 days prior 08/05/2016 FINDINGS: Stable cardiomegaly, post median sternotomy and CABG. Atherosclerosis of the thoracic aorta. Trace chronic blunting of the costophrenic angles. No pulmonary edema. No focal airspace disease. No pneumothorax. Multiple overlying monitoring devices project over the lower chest. IMPRESSION: Stable cardiomegaly post CABG.  Thoracic aortic atherosclerosis. No acute abnormality. Electronically Signed   By: Rubye Oaks M.D.   On: 08/10/2016  23:15        Scheduled Meds: . allopurinol  100 mg Oral BID  . clopidogrel  75 mg Oral Daily  . gabapentin  100 mg Oral TID  . hydrALAZINE  100 mg Oral BID  . insulin aspart  0-20 Units Subcutaneous TID WC  . insulin aspart  0-5 Units Subcutaneous QHS  . insulin aspart  10 Units Subcutaneous TID WC  . insulin glargine  70 Units Subcutaneous BID  . isosorbide mononitrate  60 mg Oral Daily  . multivitamin with minerals  1 tablet Oral Daily  . pantoprazole  40 mg Oral BID AC  . [START ON 08/12/2016] pneumococcal 23 valent vaccine  0.5 mL Intramuscular Tomorrow-1000  . pravastatin  40 mg Oral QHS  . QUEtiapine  50 mg Oral Q supper  . sodium chloride flush  3 mL Intravenous Q12H  . sodium chloride flush  3 mL Intravenous Q12H   Continuous Infusions:   LOS: 0 days    Time spent: 35 minutes    Blaike Newburn A, MD [facility] Pager [phone]  If 7PM-7AM, please contact night-coverage [email or web address] Password [facility] 08/11/2016, 12:26 PM

## 2016-08-11 NOTE — Progress Notes (Signed)
SATURATION QUALIFICATIONS: (This note is used to comply with regulatory documentation for home oxygen)  Patient Saturations on Room Air at Rest = 90-92%  Patient Saturations on Room Air while Ambulating = 85=87%  Patient Saturations on 3 Liters of oxygen while Ambulating = 90-94%  Please briefly explain why patient needs home oxygen:Desats on RA with activity.  May need home O2.  Thanks.  [facility] [phone] (pager)

## 2016-08-11 NOTE — Evaluation (Signed)
Physical Therapy Evaluation Patient Details Name: Isaiah [name] MRN: [id number] DOB: 08-Feb-1941 Today's Date: 08/11/2016   History of Present Illness  Isaiah [name] is a 76 y.o. male with medical history significant for hypertension, insulin-dependent diabetes mellitus, history of CVA, CAD with stent, chronic diastolic CHF, and history of DVT and atrial fibrillation on Coumadin, now presenting to the emergency department for evaluation of generalized weakness and shortness of breath which began the day prior. Patient had just been discharged from the hospital on 08/09/2016 after suffering an NSTEMI that was treated with DES to the proximal RCA on 08/07/2016.  Clinical Impression  Pt admitted with above diagnosis. Pt currently with functional limitations due to the deficits listed below (see PT Problem List). Pt was able to ambulate on unit.  Did desat and O2 note included.  Will have 24 hour care and will need home O2 and HHPT f/u.   Pt will benefit from skilled PT to increase their independence and safety with mobility to allow discharge to the venue listed below.    SATURATION QUALIFICATIONS: (This note is used to comply with regulatory documentation for home oxygen)  Patient Saturations on Room Air at Rest = 90-92%  Patient Saturations on Room Air while Ambulating = 85=87%  Patient Saturations on 3 Liters of oxygen while Ambulating = 90-94%  Please briefly explain why patient needs home oxygen:Desats on RA with activity.  May need home O2.   Follow Up Recommendations Home health PT;Supervision/Assistance - 24 hour    Equipment Recommendations  None recommended by PT    Recommendations for Other Services       Precautions / Restrictions Precautions Precautions: Fall Precaution Comments: monitor Oxygen sats  Restrictions Weight Bearing Restrictions: No      Mobility  Bed Mobility Overal bed mobility: Needs Assistance Bed Mobility: Supine to Sit;Sit to Supine     Supine to  sit: Min guard     General bed mobility comments: Able to come to EOB without assist but some incr time.   Transfers Overall transfer level: Needs assistance Equipment used: Rolling walker (2 wheeled) Transfers: Sit to/from Stand Sit to Stand: Min guard         General transfer comment: Min guard for safety. Verbal cues for proper hand placement during transfer.   Ambulation/Gait Ambulation/Gait assistance: Min assist;+2 physical assistance;+2 safety/equipment Ambulation Distance (Feet): 125 Feet Assistive device: Rolling walker (2 wheeled) Gait Pattern/deviations: Step-through pattern;Drifts right/left;Decreased stride length;Trunk flexed;Wide base of support   Gait velocity interpretation: Below normal speed for age/gender General Gait Details: Pt demonstrating SOB during ambulation and distance limited by fatigue. Oxygen sats monitored throughout gait and dropped down to 85% on  room air. Oxygen replaced and had to incr to 3 L to keep sats >90%.  Pt limited in ambulation distance secondary to fatigue. Verbal cues for upright posture throughout gait. Pt at times needed assist to steer RW as he was unsteady when he fatigued.  Pt with one LOB in hallway needing min assit to recover as pt lost balance posteriorly.  Overall assist and cues for ambulation for safety. Pt stopped by bathroom on the way back to bed and stood to urinate with assist for balance.  Also washed hands at sink and steadied there.    Stairs            Wheelchair Mobility    Modified Rankin (Stroke Patients Only)       Balance Overall balance assessment: Needs assistance Sitting-balance support: No upper  extremity supported;Feet supported Sitting balance-Leahy Scale: Fair     Standing balance support: Bilateral upper extremity supported;During functional activity Standing balance-Leahy Scale: Poor Standing balance comment: Reliant on RW for balance              High level balance activites:  Direction changes;Turns;Sudden stops High Level Balance Comments: min guard assist for safety.             Pertinent Vitals/Pain Pain Assessment: No/denies pain  See O2 sat note above.   Home Living Family/patient expects to be discharged to:: Private residence Living Arrangements: Children;Other relatives (daughter and her husband ) Available Help at Discharge: Family;Available 24 hours/day Type of Home: House Home Access: Level entry     Home Layout: Two level;Able to live on main level with bedroom/bathroom Home Equipment: Dan Humphreys - 2 wheels;Wheelchair - [employer] - 4 wheels;Bedside commode;Shower seat;Cane - single point Additional Comments: Daughter available 24/7. Discussed with pt about staying on first level to conserve energy at d/c. Pt agreeable to recommendations.     Prior Function Level of Independence: Independent with assistive device(s)               Hand Dominance   Dominant Hand: Right    Extremity/Trunk Assessment   Upper Extremity Assessment Upper Extremity Assessment: Defer to OT evaluation    Lower Extremity Assessment Lower Extremity Assessment: Generalized weakness (grossly 3+/5; muscle fatigue during gait. )    Cervical / Trunk Assessment Cervical / Trunk Assessment: Kyphotic  Communication   Communication: HOH  Cognition Arousal/Alertness: Awake/alert Behavior During Therapy: WFL for tasks assessed/performed Overall Cognitive Status: Within Functional Limits for tasks assessed                                        General Comments General comments (skin integrity, edema, etc.): Son in law asking about how pt did.  He agrees that pt will have 24 hour care and he will make sure that pt uses RW at all times for safety.  Pt did not get any breakfast and followed up with the nurse who states pt can order breakfast.  This PT assisted pt in ordering.     Exercises     Assessment/Plan    PT Assessment Patient needs  continued PT services  PT Problem List Decreased strength;Decreased activity tolerance;Decreased balance;Decreased mobility;Decreased knowledge of use of DME;Decreased knowledge of precautions       PT Treatment Interventions DME instruction;Gait training;Functional mobility training;Therapeutic activities;Therapeutic exercise;Balance training;Neuromuscular re-education;Patient/family education    PT Goals (Current goals can be found in the Care Plan section)  Acute Rehab PT Goals Patient Stated Goal: to go home  PT Goal Formulation: With patient Time For Goal Achievement: 08/25/16 Potential to Achieve Goals: Good    Frequency Min 3X/week   Barriers to discharge        Co-evaluation               End of Session Equipment Utilized During Treatment: Gait belt;Oxygen Activity Tolerance: Patient limited by fatigue Patient left: with call bell/phone within reach;in bed;with family/visitor present (sitting EOB) Nurse Communication: Mobility status PT Visit Diagnosis: Other abnormalities of gait and mobility (R26.89);Muscle weakness (generalized) (M62.81)    Time: 1610-9604 PT Time Calculation (min) (ACUTE ONLY): 19 min   Charges:   PT Evaluation $PT Eval Moderate Complexity: 1 Procedure     PT G Codes:   PT  G-Codes **NOT FOR INPATIENT CLASS** Functional Assessment Tool Used: AM-PAC 6 Clicks Basic Mobility Functional Limitation: Mobility: Walking and moving around Mobility: Walking and Moving Around Current Status [phone]): At least 20 percent but less than 40 percent impaired, limited or restricted Mobility: Walking and Moving Around Goal Status [phone]): At least 1 percent but less than 20 percent impaired, limited or restricted    [facility] [phone] (pager)   Berline Lopes 08/11/2016, 10:26 AM

## 2016-08-12 ENCOUNTER — Inpatient Hospital Stay (HOSPITAL_COMMUNITY): Payer: Medicare Other | Admitting: Anesthesiology

## 2016-08-12 ENCOUNTER — Encounter (HOSPITAL_COMMUNITY)
Admission: EM | Disposition: A | Discharge status: Home / Self Care | Payer: Self-pay | Source: Home / Self Care | Attending: Internal Medicine

## 2016-08-12 ENCOUNTER — Encounter (HOSPITAL_COMMUNITY): Payer: Self-pay

## 2016-08-12 DIAGNOSIS — G4733 Obstructive sleep apnea (adult) (pediatric): Secondary | ICD-10-CM

## 2016-08-12 DIAGNOSIS — D649 Anemia, unspecified: Secondary | ICD-10-CM

## 2016-08-12 HISTORY — PX: ESOPHAGOGASTRODUODENOSCOPY: SHX5428

## 2016-08-12 LAB — CBC
HCT: 28.4 % — ABNORMAL LOW (ref 39.0–52.0)
Hemoglobin: 8.5 g/dL — ABNORMAL LOW (ref 13.0–17.0)
MCH: 28.2 pg (ref 26.0–34.0)
MCHC: 29.9 g/dL — ABNORMAL LOW (ref 30.0–36.0)
MCV: 94.4 fL (ref 78.0–100.0)
Platelets: 237 10*3/uL (ref 150–400)
RBC: 3.01 MIL/uL — ABNORMAL LOW (ref 4.22–5.81)
RDW: 15.7 % — ABNORMAL HIGH (ref 11.5–15.5)
WBC: 8.1 10*3/uL (ref 4.0–10.5)

## 2016-08-12 LAB — GLUCOSE, CAPILLARY
Glucose-Capillary: 234 mg/dL — ABNORMAL HIGH (ref 65–99)
Glucose-Capillary: 186 mg/dL — ABNORMAL HIGH (ref 65–99)
Glucose-Capillary: 222 mg/dL — ABNORMAL HIGH (ref 65–99)
Glucose-Capillary: 160 mg/dL — ABNORMAL HIGH (ref 65–99)

## 2016-08-12 LAB — RENAL FUNCTION PANEL
Albumin: 3.3 g/dL — ABNORMAL LOW (ref 3.5–5.0)
Anion gap: 8 (ref 5–15)
BUN: 16 mg/dL (ref 6–20)
CO2: 27 mmol/L (ref 22–32)
Calcium: 8.3 mg/dL — ABNORMAL LOW (ref 8.9–10.3)
Chloride: 98 mmol/L — ABNORMAL LOW (ref 101–111)
Creatinine, Ser: 1.57 mg/dL — ABNORMAL HIGH (ref 0.61–1.24)
GFR calc Af Amer: 48 mL/min — ABNORMAL LOW (ref >60)
GFR calc non Af Amer: 41 mL/min — ABNORMAL LOW (ref >60)
Glucose, Bld: 233 mg/dL — ABNORMAL HIGH (ref 65–99)
Phosphorus: 2.5 mg/dL (ref 2.5–4.6)
Potassium: 3.5 mmol/L (ref 3.5–5.1)
Sodium: 133 mmol/L — ABNORMAL LOW (ref 135–145)

## 2016-08-12 LAB — TROPONIN I: Troponin I: <0.03 ng/mL (ref <0.03)

## 2016-08-12 LAB — MAGNESIUM: Magnesium: 2.2 mg/dL (ref 1.7–2.4)

## 2016-08-12 SURGERY — EGD (ESOPHAGOGASTRODUODENOSCOPY)
Anesthesia: Choice

## 2016-08-12 SURGERY — EGD (ESOPHAGOGASTRODUODENOSCOPY)
Anesthesia: Monitor Anesthesia Care

## 2016-08-12 MED ORDER — BUTAMBEN-TETRACAINE-BENZOCAINE 2-2-14 % EX AERO
INHALATION_SPRAY | CUTANEOUS | Status: DC | PRN
  Administered 2016-08-12: 2 via TOPICAL

## 2016-08-12 MED ORDER — FENTANYL CITRATE (PF) 250 MCG/5ML IJ SOLN
INTRAMUSCULAR | Status: AC
  Filled 2016-08-12: qty 5

## 2016-08-12 MED ORDER — FENTANYL CITRATE (PF) 100 MCG/2ML IJ SOLN
INTRAMUSCULAR | Status: DC | PRN
  Administered 2016-08-12: 50 ug via INTRAVENOUS

## 2016-08-12 MED ORDER — MIDAZOLAM HCL 2 MG/2ML IJ SOLN
INTRAMUSCULAR | Status: AC
  Filled 2016-08-12: qty 2

## 2016-08-12 MED ORDER — POTASSIUM CHLORIDE CRYS ER 20 MEQ PO TBCR: 40.0000 meq | EXTENDED_RELEASE_TABLET | Freq: Once | ORAL | Status: DC

## 2016-08-12 MED ORDER — MIDAZOLAM HCL 5 MG/5ML IJ SOLN
INTRAMUSCULAR | Status: DC | PRN
  Administered 2016-08-12: 2 mg via INTRAVENOUS

## 2016-08-12 MED ORDER — SODIUM CHLORIDE 0.9 % IV SOLN
INTRAVENOUS | Status: DC | PRN
  Administered 2016-08-12: 14:00:00 via INTRAVENOUS

## 2016-08-12 NOTE — Anesthesia Postprocedure Evaluation (Addendum)
Anesthesia Post Note  Patient: Isaiah [name]  Procedure(s) Performed: Procedure(s) (LRB): ESOPHAGOGASTRODUODENOSCOPY (EGD) (N/A)  Patient location during evaluation: PACU Anesthesia Type: MAC Level of consciousness: awake and alert Pain management: pain level controlled Vital Signs Assessment: post-procedure vital signs reviewed and stable Respiratory status: spontaneous breathing and respiratory function stable Cardiovascular status: stable Anesthetic complications: no       Last Vitals:  Vitals:   08/12/16 1351 08/12/16 1430  BP: (!) 139/52 (!) 145/54  Pulse: 97 98  Resp: (!) 9 18  Temp: 36.4 C     Last Pain:  Vitals:   08/12/16 1351  TempSrc: Oral  PainSc:                  Saranda Legrande DANIEL

## 2016-08-12 NOTE — Anesthesia Procedure Notes (Signed)
Procedure Name: MAC Date/Time: 08/12/2016 2:15 PM Performed by: Jacquiline Doe A Pre-anesthesia Checklist: Patient identified, Emergency Drugs available, Suction available and Patient being monitored Patient Re-evaluated:Patient Re-evaluated prior to inductionOxygen Delivery Method: Nasal cannula Intubation Type: IV induction Placement Confirmation: CO2 detector Dental Injury: Teeth and Oropharynx as per pre-operative assessment

## 2016-08-12 NOTE — Progress Notes (Signed)
Pt places self on cpap. Pt resting at this time on cpap.

## 2016-08-12 NOTE — Transfer of Care (Signed)
Immediate Anesthesia Transfer of Care Note  Patient: LISTER BRIZZI  Procedure(s) Performed: Procedure(s): ESOPHAGOGASTRODUODENOSCOPY (EGD) (N/A)  Patient Location: Endoscopy Unit  Anesthesia Type:MAC  Level of Consciousness: awake, alert , oriented, patient cooperative and responds to stimulation  Airway & Oxygen Therapy: Patient Spontanous Breathing and Patient connected to nasal cannula oxygen  Post-op Assessment: Report given to RN, Post -op Vital signs reviewed and stable, Patient moving all extremities and Patient moving all extremities X 4  Post vital signs: Reviewed and stable  Last Vitals:  Vitals:   08/12/16 1351 08/12/16 1430  BP: (!) 139/52 (!) 145/54  Pulse: 97 98  Resp: (!) 9 18  Temp: 36.4 C     Last Pain:  Vitals:   08/12/16 1351  TempSrc: Oral  PainSc:          Complications: No apparent anesthesia complications

## 2016-08-12 NOTE — Interval H&P Note (Signed)
History and Physical Interval Note:  08/12/2016 1:52 PM  Isaiah [name]  has presented today for surgery, with the diagnosis of gi bleed  The various methods of treatment have been discussed with the patient and family. After consideration of risks, benefits and other options for treatment, the patient has consented to  Procedure(s): ESOPHAGOGASTRODUODENOSCOPY (EGD) (N/A) as a surgical intervention .  The patient's history has been reviewed, patient examined, no change in status, stable for surgery.  I have reviewed the patient's chart and labs.  Questions were answered to the patient's satisfaction.     Lasasha Brophy JR,Zaniyah Wernette L

## 2016-08-12 NOTE — H&P (View-Only) (Signed)
[place] Reason for consult: anemia, stool positive for FOB Referring Physician: [facility]. PCP: Dr. Sarah [name]. Primary G.I.: patient unassigned  Isaiah [name] is an 76 y.o. male.  HPI: patient had been in the hospital and was discharged 3/29 after an NSTEMI treated with the drug eluting stent to the proximal RCA by cardiac 3/27. He Had Been on Coumadin and This Was Restarted and Was Placed on Plavix. His Hemoglobin Had Fallen from 9.6 to 7.9 without Active Bleeding. He [facility] and since His Hospital Discharge Had Problems Eating Was Feeling Quite Weak. He Had No Abdominal Pain or Nausea. He States That He Has Minimal Heartburn Take Stones Occasionally. Denies the Use of Any NSAIDs. Presented Back to the ER with the Symptoms INR Was Actually Still Elevated Just a Little Stools Were Positive in Hemoglobin Was Still. The patient tells me that he has never had EGD or colonoscopy. He's had a history of fairly significant CAD and CHF. Set a history of diabetes DVT and strokes as well as vascular disease.  Past Medical History:  Diagnosis Date  . Atrial fibrillation ([place])   . CAD (coronary artery disease)   . CHF (congestive heart failure) ([place])   . Diabetes mellitus   . DVT (deep venous thrombosis) ([place])   . Fall at home 10/2015  . Hyperlipidemia   . Hypertension   . Left-sided carotid artery disease ([place])   . Myocardial infarction   . NSTEMI (non-ST elevated myocardial infarction) ([place])   . Peripheral vascular disease ([place])   . Stroke ([place])   . Venous insufficiency     Past Surgical History:  Procedure Laterality Date  . CORONARY ARTERY BYPASS GRAFT  1997   vein harvest from right leg  . CORONARY STENT INTERVENTION N/A 08/07/2016   Procedure: Coronary Stent Intervention;  Surgeon: Jettie Booze, MD;  Location: [facility];  Service: Cardiovascular;  Laterality: N/A;  RCA  . INGUINAL HERNIA REPAIR  1980's   Bilateral,  done in [facility]   . LEFT HEART CATH AND CORS/GRAFTS ANGIOGRAPHY N/A 08/07/2016   Procedure: Left Heart Cath and Cors/Grafts Angiography;  Surgeon: Jettie Booze, MD;  Location: [facility];  Service: Cardiovascular;  Laterality: N/A;  . PR VEIN BYPASS GRAFT,AORTO-FEM-POP  1980   FEM-FEM BPG by Dr. Deon Pilling    Family History  Problem Relation Age of Onset  . Other Mother     varicose veins  . Heart disease Mother   . Hyperlipidemia Mother   . Hypertension Mother   . Varicose Veins Mother   . Cancer Father   . Diabetes Father   . Heart disease Father     before age 72  . Hyperlipidemia Father   . Hypertension Father   . Other Father     varicose veins  . Heart attack Father   . Diabetes Daughter   . Hyperlipidemia Daughter   . Hypertension Daughter   . Other Daughter     varicose veins  . Varicose Veins Daughter   . Hypertension Son   . Diabetes Sister   . Heart disease Sister     DVT  . Other Sister     varicose veins  . Hyperlipidemia Sister   . Hypertension Sister   . Varicose Veins Sister   . Other Brother     varicose veins  . Hyperlipidemia Brother   . Hypertension Brother   . Heart attack Brother     Social History:  reports that he quit smoking about 38 years ago. His smoking use included Cigarettes. He has never used smokeless tobacco. He reports that he does not drink alcohol or use drugs.  Allergies: No Known Allergies  Medications; Prior to Admission medications   Medication Sig Start Date End Date Taking? Authorizing Provider  allopurinol (ZYLOPRIM) 100 MG tablet TAKE 2 TABLETS BY MOUTH DAILY. Patient taking differently: TAKE 1 TABLET BY MOUTH TWICE DAILY 05/03/14  Yes Lorretta Harp, MD  clopidogrel (PLAVIX) 75 MG tablet Take 1 tablet (75 mg total) by mouth daily with breakfast. 08/10/16  Yes Jonetta Osgood, MD  enalapril (VASOTEC) 20 MG tablet Take 10 mg by mouth daily.    Yes Historical Provider, MD  ferrous sulfate 325 (65 FE) MG tablet Take 1  tablet (325 mg total) by mouth 2 (two) times daily with a meal. 08/09/16  Yes Shanker Kristeen Mans, MD  furosemide (LASIX) 40 MG tablet Take 40 mg by mouth See admin instructions. Take 1 tablet (40 mg) by mouth twice daily - morning and noon   Yes Historical Provider, MD  gabapentin (NEURONTIN) 100 MG capsule Take 100 mg by mouth 3 (three) times daily.  01/28/13  Yes Historical Provider, MD  glipiZIDE (GLUCOTROL) 5 MG tablet Take 5 mg by mouth 2 (two) times daily before a meal.   Yes Historical Provider, MD  hydrALAZINE (APRESOLINE) 100 MG tablet Take 100 mg by mouth 2 (two) times daily.   Yes Historical Provider, MD  insulin aspart protamine- aspart (NOVOLOG MIX 70/30) (70-30) 100 UNIT/ML injection Inject 100 Units into the skin 3 (three) times daily before meals.   Yes Historical Provider, MD  isosorbide mononitrate (IMDUR) 60 MG 24 hr tablet Take 1 tablet (60 mg total) by mouth daily. Appointment needed for future refills 05/03/15  Yes Lorretta Harp, MD  metolazone (ZAROXOLYN) 2.5 MG tablet Take 1 tablet (2.5 mg total) by mouth as needed. Patient taking differently: Take 2.5 mg by mouth as needed (fluid build up/ weight gain).  04/29/14  Yes Lorretta Harp, MD  Multiple Vitamin (MULTIVITAMIN WITH MINERALS) TABS tablet Take 1 tablet by mouth daily.   Yes Historical Provider, MD  pantoprazole (PROTONIX) 40 MG tablet Take 40 mg by mouth 2 (two) times daily before a meal.    Yes Historical Provider, MD  pravastatin (PRAVACHOL) 40 MG tablet Take 40 mg by mouth at bedtime.    Yes Historical Provider, MD  PRESCRIPTION MEDICATION Inhale into the lungs at bedtime. CPAP   Yes Historical Provider, MD  Psyllium (METAMUCIL PO) Take 2 capsules by mouth at bedtime.   Yes Historical Provider, MD  QUEtiapine (SEROQUEL) 50 MG tablet Take 50 mg by mouth daily with supper.   Yes Historical Provider, MD  traMADol (ULTRAM) 50 MG tablet Take 50-100 mg by mouth 3 (three) times daily as needed (pain).  08/30/15  Yes  Historical Provider, MD  warfarin (COUMADIN) 4 MG tablet Take 4 mg by mouth daily with supper.   Yes Historical Provider, MD   . allopurinol  100 mg Oral BID  . clopidogrel  75 mg Oral Daily  . furosemide  60 mg Intravenous BID  . gabapentin  100 mg Oral TID  . hydrALAZINE  100 mg Oral BID  . insulin aspart  0-20 Units Subcutaneous TID WC  . insulin aspart  0-5 Units Subcutaneous QHS  . insulin aspart  10 Units Subcutaneous TID WC  . insulin glargine  70 Units Subcutaneous BID  .  isosorbide mononitrate  60 mg Oral Daily  . metolazone  2.5 mg Oral Daily  . multivitamin with minerals  1 tablet Oral Daily  . pantoprazole  40 mg Oral BID AC  . [START ON 08/12/2016] pneumococcal 23 valent vaccine  0.5 mL Intramuscular Tomorrow-1000  . pravastatin  40 mg Oral QHS  . QUEtiapine  50 mg Oral Q supper  . sodium chloride flush  3 mL Intravenous Q12H  . sodium chloride flush  3 mL Intravenous Q12H   PRN Meds sodium chloride, acetaminophen **OR** acetaminophen, ondansetron **OR** ondansetron (ZOFRAN) IV, sodium chloride flush, traMADol Results for orders placed or performed during the hospital encounter of 08/10/16 (from the past 48 hour(s))  CBC with Differential/Platelet     Status: Abnormal   Collection Time: 08/10/16 11:23 PM  Result Value Ref Range   WBC 9.3 4.0 - 10.5 K/uL   RBC 2.85 (L) 4.22 - 5.81 MIL/uL   Hemoglobin 7.9 (L) 13.0 - 17.0 g/dL   HCT 26.8 (L) 39.0 - 52.0 %   MCV 94.0 78.0 - 100.0 fL   MCH 27.7 26.0 - 34.0 pg   MCHC 29.5 (L) 30.0 - 36.0 g/dL   RDW 15.3 11.5 - 15.5 %   Platelets 226 150 - 400 K/uL   Neutrophils Relative % 57 %   Neutro Abs 5.4 1.7 - 7.7 K/uL   Lymphocytes Relative 30 %   Lymphs Abs 2.8 0.7 - 4.0 K/uL   Monocytes Relative 8 %   Monocytes Absolute 0.7 0.1 - 1.0 K/uL   Eosinophils Relative 4 %   Eosinophils Absolute 0.4 0.0 - 0.7 K/uL   Basophils Relative 1 %   Basophils Absolute 0.1 0.0 - 0.1 K/uL  Comprehensive metabolic panel     Status: Abnormal    Collection Time: 08/10/16 11:23 PM  Result Value Ref Range   Sodium 143 135 - 145 mmol/L   Potassium 3.2 (L) 3.5 - 5.1 mmol/L   Chloride 102 101 - 111 mmol/L   CO2 31 22 - 32 mmol/L   Glucose, Bld 145 (H) 65 - 99 mg/dL   BUN 19 6 - 20 mg/dL   Creatinine, Ser 1.61 (H) 0.61 - 1.24 mg/dL   Calcium 8.6 (L) 8.9 - 10.3 mg/dL   Total Protein 6.7 6.5 - 8.1 g/dL   Albumin 3.2 (L) 3.5 - 5.0 g/dL   AST 22 15 - 41 U/L   ALT 13 (L) 17 - 63 U/L   Alkaline Phosphatase 53 38 - 126 U/L   Total Bilirubin 0.4 0.3 - 1.2 mg/dL   GFR calc non Af Amer 40 (L) >60 mL/min   GFR calc Af Amer 47 (L) >60 mL/min    Comment: (NOTE) The eGFR has been calculated using the CKD EPI equation. This calculation has not been validated in all clinical situations. eGFR's persistently <60 mL/min signify possible Chronic Kidney Disease.    Anion gap 10 5 - 15  Brain natriuretic peptide     Status: Abnormal   Collection Time: 08/10/16 11:23 PM  Result Value Ref Range   B Natriuretic Peptide 121.5 (H) 0.0 - 100.0 pg/mL  I-stat troponin, ED     Status: None   Collection Time: 08/10/16 11:34 PM  Result Value Ref Range   Troponin i, poc 0.01 0.00 - 0.08 ng/mL   Comment 3            Comment: Due to the release kinetics of cTnI, a negative result within the first hours of   the onset of symptoms does not rule out myocardial infarction with certainty. If myocardial infarction is still suspected, repeat the test at appropriate intervals.   I-Stat CG4 Lactic Acid, ED     Status: None   Collection Time: 08/10/16 11:36 PM  Result Value Ref Range   Lactic Acid, Venous 0.91 0.5 - 1.9 mmol/L  Urinalysis, Routine w reflex microscopic     Status: None   Collection Time: 08/11/16  2:19 AM  Result Value Ref Range   Color, Urine YELLOW YELLOW   APPearance CLEAR CLEAR   Specific Gravity, Urine 1.013 1.005 - 1.030   pH 5.0 5.0 - 8.0   Glucose, UA NEGATIVE NEGATIVE mg/dL   Hgb urine dipstick NEGATIVE NEGATIVE   Bilirubin Urine  NEGATIVE NEGATIVE   Ketones, ur NEGATIVE NEGATIVE mg/dL   Protein, ur NEGATIVE NEGATIVE mg/dL   Nitrite NEGATIVE NEGATIVE   Leukocytes, UA NEGATIVE NEGATIVE  POC occult blood, ED Provider will collect     Status: Abnormal   Collection Time: 08/11/16  4:10 AM  Result Value Ref Range   Fecal Occult Bld POSITIVE (A) NEGATIVE  Type and screen     Status: None   Collection Time: 08/11/16  5:00 AM  Result Value Ref Range   ABO/RH(D) A POS    Antibody Screen NEG    Sample Expiration 08/14/2016   Protime-INR     Status: Abnormal   Collection Time: 08/11/16  5:00 AM  Result Value Ref Range   Prothrombin Time 16.8 (H) 11.4 - 15.2 seconds   INR 1.35   ABO/Rh     Status: None   Collection Time: 08/11/16  5:00 AM  Result Value Ref Range   ABO/RH(D) A POS   TSH     Status: None   Collection Time: 08/11/16  7:57 AM  Result Value Ref Range   TSH 2.504 0.350 - 4.500 uIU/mL    Comment: Performed by a 3rd Generation assay with a functional sensitivity of <=0.01 uIU/mL.  Glucose, capillary     Status: Abnormal   Collection Time: 08/11/16  8:22 AM  Result Value Ref Range   Glucose-Capillary 175 (H) 65 - 99 mg/dL  Glucose, capillary     Status: Abnormal   Collection Time: 08/11/16 12:11 PM  Result Value Ref Range   Glucose-Capillary 203 (H) 65 - 99 mg/dL  Troponin I     Status: None   Collection Time: 08/11/16  1:10 PM  Result Value Ref Range   Troponin I <0.03 <0.03 ng/mL  Glucose, capillary     Status: Abnormal   Collection Time: 08/11/16  4:25 PM  Result Value Ref Range   Glucose-Capillary 209 (H) 65 - 99 mg/dL    Ct Head Wo Contrast  Result Date: 08/11/2016 CLINICAL DATA:  Weakness. EXAM: CT HEAD WITHOUT CONTRAST TECHNIQUE: Contiguous axial images were obtained from the base of the skull through the vertex without intravenous contrast. COMPARISON:  Head CT 4 days prior 08/08/2015 FINDINGS: Brain: Stable atrophy and chronic small vessel ischemia. Remote lacunar infarct in left basal  ganglia unchanged. No CT findings of acute ischemia. No hemorrhage, mass effect or midline shift. No hydrocephalus. Incidental note of dural calcifications. Vascular: Atherosclerosis of skullbase vasculature without hyperdense vessel or abnormal calcification. Skull: Normal. Negative for fracture or focal lesion. Sinuses/Orbits: Minimal mucosal thickening of left sphenoid sinus. No fluid levels. Trace opacification of lower left mastoid air cells, unchanged. Other: None. IMPRESSION: No acute intracranial abnormality. Stable atrophy and chronic small vessel ischemia.  Electronically Signed   By: Jeb Levering M.D.   On: 08/11/2016 06:48   Dg Chest Port 1 View  Result Date: 08/10/2016 CLINICAL DATA:  Shortness of breath, weakness. EXAM: PORTABLE CHEST 1 VIEW COMPARISON:  Radiographs 5 days prior 08/05/2016 FINDINGS: Stable cardiomegaly, post median sternotomy and CABG. Atherosclerosis of the thoracic aorta. Trace chronic blunting of the costophrenic angles. No pulmonary edema. No focal airspace disease. No pneumothorax. Multiple overlying monitoring devices project over the lower chest. IMPRESSION: Stable cardiomegaly post CABG.  Thoracic aortic atherosclerosis. No acute abnormality. Electronically Signed   By: Jeb Levering M.D.   On: 08/10/2016 23:15               Blood pressure (!) 156/70, pulse 74, temperature 97.6 F (36.4 C), temperature source Oral, resp. rate 20, height _0  (1.702 m), weight 111.2 kg (245 lb 3.2 oz), SpO2 97 %.  Physical exam:   General-- morbidly obese white male ENT-- nonicteric Neck-- very thick neck Heart-- regular rate and rhythm without murmurs are gallops Lungs-- clear  Abdomen-- morbidly obese soft and nontender Psych-- oriented but appears to be somewhat mentally slow   Assessment: 1. Positive stool/anemia. Source unclear. Patient does need to be anticoagulated so it's important we try to determine the source. He's never had EGD colonoscopy. I  think an EGD would be reasonable. 2. Afib chronically anticoagulated 3. History of CAD, CHF 4. Diabetes 5. CKD stage III 6. History of DVT and stroke  Plan: 1. We will plan on EGD in the a.m. just to rule out actively bleeding ulcer or upper G.I. pathology. Have discussed this in detail with the patient and his son.    Todd Jelinski JR,Jveon Pound L 08/11/2016, 4:46 PM   This note was created using voice recognition software and minor errors may Have occurred unintentionally. Pager: [phone] If no answer or after hours call [phone]

## 2016-08-12 NOTE — Anesthesia Preprocedure Evaluation (Addendum)
Anesthesia Evaluation  Patient identified by MRN, date of birth, ID band Patient awake    Reviewed: Allergy & Precautions, NPO status , Patient's Chart, lab work & pertinent test results  History of Anesthesia Complications Negative for: history of anesthetic complications  Airway Mallampati: III  TM Distance: >3 FB Neck ROM: Full    Dental no notable dental hx. (+) Dental Advisory Given   Pulmonary sleep apnea , former smoker,    Pulmonary exam normal        Cardiovascular hypertension, + angina + CAD, + Past MI and + Cardiac Stents  Normal cardiovascular exam+ dysrhythmias Atrial Fibrillation   Conclusion     Ost Cx to Prox Cx lesion, 100 %stenosed. SVG to OM patent, with Y graft SVG to diagonal originating from mid portion of the OM graft. Moderate disease in the origin of the Y graft portion to the diagonal.  Mid LAD lesion, 70 %stenosed. Dist LAD lesion, 100 %stenosed. LIMA to LAD is widely patent.  Ost RCA to Prox RCA lesion, 80 %stenosed. A STENT XIENCE ALPINE RX [id number] drug eluting stent was successfully placed.  Post intervention, there is a 0% residual stenosis.  LV end diastolic pressure is moderately elevated.  There is no aortic valve stenosis.  Study Conclusions  - Left ventricle: The cavity size was normal. Wall thickness was   normal. Systolic function was normal. The estimated ejection   fraction was in the range of 55% to 65%. Wall motion was normal;   there were no regional wall motion abnormalities. - Mitral valve: There was mild regurgitation. - Right ventricle: Systolic function was mildly reduced. - Right atrium: The atrium was mildly dilated. - Pulmonary arteries: Systolic pressure was moderately increased.   PA peak pressure: 44 mm Hg (S).   Neuro/Psych CVA    GI/Hepatic negative GI ROS, Neg liver ROS,   Endo/Other  diabetesMorbid obesity  Renal/GU Renal InsufficiencyRenal disease     Musculoskeletal   Abdominal   Peds  Hematology   Anesthesia Other Findings   Reproductive/Obstetrics                            Anesthesia Physical Anesthesia Plan  ASA: IV and emergent  Anesthesia Plan: MAC   Post-op Pain Management:    Induction:   Airway Management Planned: Natural Airway and Simple Face Mask  Additional Equipment:   Intra-op Plan:   Post-operative Plan:   Informed Consent: I have reviewed the patients History and Physical, chart, labs and discussed the procedure including the risks, benefits and alternatives for the proposed anesthesia with the patient or authorized representative who has indicated his/her understanding and acceptance.   Dental advisory given  Plan Discussed with: CRNA, Anesthesiologist and Surgeon  Anesthesia Plan Comments: (Pt had recent Stent placement for unstable angina.  Will receive minimal sedation.)       Anesthesia Quick Evaluation

## 2016-08-12 NOTE — Op Note (Signed)
[facility] Patient Name: Isaiah [name] Procedure Date : 08/12/2016 MRN: [id number] Attending MD: Tresea Mall Dr., MD Date of Birth: [birth year] CSN: [id number] Age: 76 Admit Type: Inpatient Procedure:                Upper GI endoscopy Indications:              Iron deficiency anemia, Heme positive stool,                            extensive cardiac history and has been on Plavix as                            well as Coumadin. Has drug eluting stents, PAF.                            Question is whether or not to resume Coumadin Providers:                Fayrene Fearing L. Dewon Mendizabal Dr., MD, Will Bonnet RN, RN,                            Beryle Beams, Technician, Wray Kearns, CRNA Referring MD:             [facility]. PCP: Dr Sara [place].                            Cardiologist Dr Allyson Sabal Medicines:                Fentanyl 50 micrograms IV, Midazolam 2 mg IV, See                            the Anesthesia note for documentation of the                            administered medications, Cetacaine spray Complications:            No immediate complications. Estimated Blood Loss:     Estimated blood loss: none. Procedure:                Pre-Anesthesia Assessment:                           - Prior to the procedure, a History and Physical                            was performed, and patient medications and                            allergies were reviewed. The patient's tolerance of                            previous anesthesia was also reviewed. The risks                            and benefits of the procedure and the sedation  options and risks were discussed with the patient.                            All questions were answered, and informed consent                            was obtained. Prior Anticoagulants: The patient has                            taken Plavix (clopidogrel), last dose was 1 day                            prior to  procedure. ASA Grade Assessment: IV - A                            patient with severe systemic disease that is a                            constant threat to life. After reviewing the risks                            and benefits, the patient was deemed in                            satisfactory condition to undergo the procedure.                           After obtaining informed consent, the endoscope was                            passed under direct vision. Throughout the                            procedure, the patient's blood pressure, pulse, and                            oxygen saturations were monitored continuously. The                            [id number] ([id number]) scope was introduced through the                            mouth, and advanced to the second part of duodenum.                            The upper GI endoscopy was accomplished without                            difficulty. The patient tolerated the procedure                            well. The upper GI endoscopy was accomplished  without difficulty. The patient tolerated the                            procedure well. Scope In: Scope Out: Findings:      The esophagus was normal.      The stomach was normal.      The examined duodenum was normal. Impression:               - Normal esophagus.                           - Normal stomach.                           - Normal examined duodenum.                           - No specimens collected.                           - Blood in stool without cause found on endoscopic                            exam Moderate Sedation:      Moderate (conscious) sedation was personally administered by an       anesthesia professional. The following parameters were monitored: oxygen       saturation, heart rate, blood pressure, respiratory rate, EKG, adequacy       of pulmonary ventilation, and response to care. Recommendation:           - Return patient  to hospital ward for ongoing care.                           - Resume previous diet.                           - Continue present medications. Procedure Code(s):        --- Professional ---                           [phone], Esophagogastroduodenoscopy, flexible,                            transoral; diagnostic, including collection of                            specimen(s) by brushing or washing, when performed                            (separate procedure) Diagnosis Code(s):        --- Professional ---                           D50.9, Iron deficiency anemia, unspecified                           R19.5, Other fecal abnormalities CPT copyright 2016 [employer]. All rights reserved. The codes documented in this report  are preliminary and upon coder review may  be revised to meet current compliance requirements. Tresea Mall Dr., MD 08/12/2016 2:37:05 PM This report has been signed electronically. Number of Addenda: 0

## 2016-08-12 NOTE — Progress Notes (Signed)
PROGRESS NOTE  Isaiah [name]  ZOX:[id number] DOB: Nov 05, 1940 DOA: 08/10/2016 PCP: Burman Blacksmith., MD Outpatient Specialists:  Subjective: No bowel movement this morning, nothing by mouth for schedule EGD later today. No shortness of breath  Brief Narrative:  Isaiah [name] is a 76 y.o. male with medical history significant for hypertension, insulin-dependent diabetes mellitus, history of CVA, CAD with stent, chronic diastolic CHF, and history of DVT and atrial fibrillation on Coumadin, now presenting to the ED for evaluation of generalized weakness and shortness of breath which began the day prior. Patient had just been discharged from the hospital on 08/09/2016 after suffering an NSTEMI that was treated with DES to the proximal RCA on 08/07/2016. He was started on Plavix in place of aspirin and was restarted on his Coumadin. Hemoglobin fell from 9.6-7.9 during the hospital admission with no sign of active bleeding. Since returning home, the patient denies any chest pain or palpitations and denies any fevers or chills, but notes progressive generalized weakness and mild dyspnea without cough. He denies tenderness or swelling in the lower extremities. He denies headache, change in vision or hearing, or focal numbness or weakness. He has had a poor appetite since hospital discharge and has not eaten or drinking much at all per his son at the bedside. Patient denies abdominal pain or nausea and has not noted any melena or hematochezia.  Assessment & Plan:   Principal Problem:   Occult GI bleeding Active Problems:   Coronary artery disease   Chronic atrial fibrillation (HCC)   Essential hypertension   Type 2 diabetes mellitus with vascular disease (HCC)   Obstructive sleep apnea   CKD (chronic kidney disease), stage III   Hypokalemia   General weakness   GI bleed   Weakness     Acute on chronic diastolic CHF  -Does not have significant lower extremity edema, but he came in with DOE.    -On Lasix and metolazone at home, switched to IV Lasix, scheduled metolazone. -Follow renal function and intake/output closely. His BMP around baseline of 1.5. -Check BMP in a.m.  Generalized weakness, DOE  -This is likely secondary to acute on chronic diastolic CHF and anemia.  Normocytic anemia, occult GIB  - Hgb is 7.9 on admission, stable from time of recent discharge on 3/29, but down from 9.6 on 3/25 - There is no gross bleeding, no abd pain, and no nausea - FOBT is positive, INR is only 1.3. - Not stop Plavix, stent placed on 08/07/2016. - Iron studies showed iron deficiency anemia, likely chronic bleeding. - GI consulted, EGD to be done today, I'll give a dose of her Feraheme later today.  Coronary artery disease  - No anginal complaints on arrival, troponin 0.01, no acute ischemic features on EKG  - Had DES placed to proximal RCA on 08/07/16  - Continue Plavix and statin; beta-blocker discontinued during recent admission for bradycardia pending cardiology follow-up  - He is being monitored on telemetry   Chronic atrial fibrillation  - CHADS-VASc at least 61 (age x2, CVA x2, CAD, DM)  - Coumadin currently held for anemia with occult GIB  - Rate is well-controlled; beta-blocker recently placed on hold for bradycardia  Insulin-dependent DM  - A1c was 10.5% in September 2014  - Managed at home with Novolog 70/30 100 units TID, plus glipizide  - Check CBG with meals and qHS  - Start Lantus 75 units BID with 10 units Novolog with meals and a high-intensity correctional   Hypertension  -  Diastolic pressures soft initially  - Enalapril held on admission given soft DBP and bump in SCr    CKD stage III  - SCr is 1.61 on admission, up from 1.2 a couple days prior, but all other recent values are in 1.6-range  - Check BMP in a.m.  Hypokalemia  - Serum potassium is 3.2 on admission with prolonged QTc  - Repleted with oral supplements, potassium 3.5 this morning.      DVT prophylaxis:  Code Status: Full Code Family Communication:  Disposition Plan:  Diet: Diet NPO time specified  Consultants:   Cards  GI  Procedures:   None  Antimicrobials:   None  Objective: Vitals:   08/11/16 0500 08/11/16 0601 08/12/16 0458 08/12/16 1004  BP: (!) 161/65 (!) 156/70 (!) 149/60 (!) 150/61  Pulse: 71 74 98 (!) 103  Resp: Temp:  97.6 F (36.4 C) 97.5 F (36.4 C)   TempSrc:  Oral Oral   SpO2: 95% 97% 96% 98%  Weight:  111.2 kg (245 lb 3.2 oz)    Height:   (1.702 m)      Intake/Output Summary (Last 24 hours) at 08/12/16 1118 Last data filed at 08/11/16 1800  Gross per 24 hour  Intake              240 ml  Output              900 ml  Net             -660 ml   Filed Weights   08/11/16 0601  Weight: 111.2 kg (245 lb 3.2 oz)    Examination: General exam: Appears calm and comfortable  Respiratory system: Clear to auscultation. Respiratory effort normal. Cardiovascular system: S1 & S2 heard, RRR. No JVD, murmurs, rubs, gallops or clicks. No pedal edema. Gastrointestinal system: Abdomen is nondistended, soft and nontender. No organomegaly or masses felt. Normal bowel sounds heard. Central nervous system: Alert and oriented. No focal neurological deficits. Extremities: Symmetric 5 x 5 power. Skin: No rashes, lesions or ulcers Psychiatry: Judgement and insight appear normal. Mood & affect appropriate.   Data Reviewed: I have personally reviewed following labs and imaging studies  CBC:  Recent Labs Lab 08/05/16 1925 08/07/16 0526 08/08/16 0144 08/09/16 0338 08/10/16 2323 08/12/16 0800  WBC 10.4 8.6 10.8* 10.4 9.3 8.1  NEUTROABS 7.1  --   --   --  5.4  --   HGB 9.6* 8.4* 8.7* 7.9* 7.9* 8.5*  HCT 31.9* 28.9* 29.8* 27.4* 26.8* 28.4*  MCV 94.4 95.4 95.2 93.8 94.0 94.4  PLT 259 220 248 229 226 237   Basic Metabolic Panel:  Recent Labs Lab 08/05/16 1925 08/07/16 0526 08/08/16 0144 08/10/16 2323 08/12/16 0027  NA  136 137 136 143 133*  K 3.7 3.9 4.0 3.2* 3.5  CL 97* 98* 98* 102 98*  CO2 GLUCOSE 242* 232* 228* 145* 233*  BUN CREATININE 1.67* 1.57* 1.20 1.61* 1.57*  CALCIUM 8.6* 8.7* 8.9 8.6* 8.3*  MG  --   --   --   --  2.2  PHOS  --   --   --   --  2.5   GFR: Estimated Creatinine Clearance: 48.4 mL/min (A) (by C-G formula based on SCr of 1.57 mg/dL (H)). Liver Function Tests:  Recent Labs Lab 08/05/16 1925 08/10/16 2323 08/12/16 0027  AST 26 22  --  ALT 16* 13*  --   ALKPHOS 67 53  --   BILITOT 0.5 0.4  --   PROT 7.8 6.7  --   ALBUMIN 3.6 3.2* 3.3*   No results for input(s): LIPASE, AMYLASE in the last 168 hours. No results for input(s): AMMONIA in the last 168 hours. Coagulation Profile:  Recent Labs Lab 08/07/16 0526 08/07/16 1054 08/08/16 0144 08/09/16 0338 08/11/16 0500  INR 1.86 1.63 1.56 1.32 1.35   Cardiac Enzymes:  Recent Labs Lab 08/06/16 0623 08/06/16 1128 08/11/16 1310 08/11/16 1827 08/12/16 0027  TROPONINI 0.03* 0.03* <0.03 <0.03 <0.03   BNP (last 3 results) No results for input(s): PROBNP in the last 8760 hours. HbA1C: No results for input(s): HGBA1C in the last 72 hours. CBG:  Recent Labs Lab 08/11/16 0822 08/11/16 1211 08/11/16 1625 08/11/16 2109 08/12/16 0643  GLUCAP 175* 203* 209* 224* 234*   Lipid Profile: No results for input(s): CHOL, HDL, LDLCALC, TRIG, CHOLHDL, LDLDIRECT in the last 72 hours. Thyroid Function Tests:  Recent Labs  08/11/16 0757  TSH 2.504   Anemia Panel: No results for input(s): VITAMINB12, FOLATE, FERRITIN, TIBC, IRON, RETICCTPCT in the last 72 hours. Urine analysis:    Component Value Date/Time   COLORURINE YELLOW 08/11/2016 0219   APPEARANCEUR CLEAR 08/11/2016 0219   LABSPEC 1.013 08/11/2016 0219   PHURINE 5.0 08/11/2016 0219   GLUCOSEU NEGATIVE 08/11/2016 0219   HGBUR NEGATIVE 08/11/2016 0219   BILIRUBINUR NEGATIVE 08/11/2016 0219   KETONESUR NEGATIVE 08/11/2016 0219    PROTEINUR NEGATIVE 08/11/2016 0219   NITRITE NEGATIVE 08/11/2016 0219   LEUKOCYTESUR NEGATIVE 08/11/2016 0219   Sepsis Labs: (procalcitonin:4,lacticidven:4)  ) Recent Results (from the past 240 hour(s))  MRSA PCR Screening     Status: Abnormal   Collection Time: 08/06/16 12:20 AM  Result Value Ref Range Status   MRSA by PCR POSITIVE (A) NEGATIVE Final    Comment:        The GeneXpert MRSA Assay (FDA approved for NASAL specimens only), is one component of a comprehensive MRSA colonization surveillance program. It is not intended to diagnose MRSA infection nor to guide or monitor treatment for MRSA infections. RESULT CALLED TO, READ BACK BY AND VERIFIED WITH: HITT,L RN [phone] 08/06/16 MITCHELL,L      Invalid input(s): PROCALCITONIN, LACTICACIDVEN   Radiology Studies: Ct Head Wo Contrast  Result Date: 08/11/2016 CLINICAL DATA:  Weakness. EXAM: CT HEAD WITHOUT CONTRAST TECHNIQUE: Contiguous axial images were obtained from the base of the skull through the vertex without intravenous contrast. COMPARISON:  Head CT 4 days prior 08/08/2015 FINDINGS: Brain: Stable atrophy and chronic small vessel ischemia. Remote lacunar infarct in left basal ganglia unchanged. No CT findings of acute ischemia. No hemorrhage, mass effect or midline shift. No hydrocephalus. Incidental note of dural calcifications. Vascular: Atherosclerosis of skullbase vasculature without hyperdense vessel or abnormal calcification. Skull: Normal. Negative for fracture or focal lesion. Sinuses/Orbits: Minimal mucosal thickening of left sphenoid sinus. No fluid levels. Trace opacification of lower left mastoid air cells, unchanged. Other: None. IMPRESSION: No acute intracranial abnormality. Stable atrophy and chronic small vessel ischemia. Electronically Signed   By: Rubye Oaks M.D.   On: 08/11/2016 06:48   Dg Chest Port 1 View  Result Date: 08/10/2016 CLINICAL DATA:  Shortness of breath, weakness. EXAM:  PORTABLE CHEST 1 VIEW COMPARISON:  Radiographs 5 days prior 08/05/2016 FINDINGS: Stable cardiomegaly, post median sternotomy and CABG. Atherosclerosis of the thoracic aorta. Trace chronic blunting of the costophrenic angles. No pulmonary edema. No  focal airspace disease. No pneumothorax. Multiple overlying monitoring devices project over the lower chest. IMPRESSION: Stable cardiomegaly post CABG.  Thoracic aortic atherosclerosis. No acute abnormality. Electronically Signed   By: Rubye Oaks M.D.   On: 08/10/2016 23:15        Scheduled Meds: . allopurinol  100 mg Oral BID  . clopidogrel  75 mg Oral Daily  . furosemide  60 mg Intravenous BID  . gabapentin  100 mg Oral TID  . hydrALAZINE  100 mg Oral BID  . insulin aspart  0-20 Units Subcutaneous TID WC  . insulin aspart  0-5 Units Subcutaneous QHS  . insulin aspart  10 Units Subcutaneous TID WC  . insulin glargine  70 Units Subcutaneous BID  . isosorbide mononitrate  60 mg Oral Daily  . metolazone  2.5 mg Oral Daily  . multivitamin with minerals  1 tablet Oral Daily  . pantoprazole  40 mg Oral BID AC  . pneumococcal 23 valent vaccine  0.5 mL Intramuscular Tomorrow-1000  . pravastatin  40 mg Oral QHS  . QUEtiapine  50 mg Oral Q supper  . sodium chloride flush  3 mL Intravenous Q12H  . sodium chloride flush  3 mL Intravenous Q12H   Continuous Infusions: . sodium chloride 20 mL/hr at 08/11/16 2203     LOS: 1 day    Time spent: 35 minutes    Bettymae Yott A, MD [facility] Pager [phone]  If 7PM-7AM, please contact night-coverage [email or web address] Password [email or web address] 08/12/2016, 11:18 AM

## 2016-08-13 ENCOUNTER — Encounter (HOSPITAL_COMMUNITY)
Admission: EM | Disposition: A | Discharge status: Home / Self Care | Payer: Self-pay | Source: Home / Self Care | Attending: Internal Medicine

## 2016-08-13 LAB — GLUCOSE, CAPILLARY
Glucose-Capillary: 183 mg/dL — ABNORMAL HIGH (ref 65–99)
Glucose-Capillary: 225 mg/dL — ABNORMAL HIGH (ref 65–99)
Glucose-Capillary: 214 mg/dL — ABNORMAL HIGH (ref 65–99)
Glucose-Capillary: 191 mg/dL — ABNORMAL HIGH (ref 65–99)

## 2016-08-13 LAB — RENAL FUNCTION PANEL
Albumin: 3.3 g/dL — ABNORMAL LOW (ref 3.5–5.0)
Anion gap: 8 (ref 5–15)
BUN: 16 mg/dL (ref 6–20)
CO2: 31 mmol/L (ref 22–32)
Calcium: 8.5 mg/dL — ABNORMAL LOW (ref 8.9–10.3)
Chloride: 95 mmol/L — ABNORMAL LOW (ref 101–111)
Creatinine, Ser: 1.55 mg/dL — ABNORMAL HIGH (ref 0.61–1.24)
GFR calc Af Amer: 49 mL/min — ABNORMAL LOW (ref >60)
GFR calc non Af Amer: 42 mL/min — ABNORMAL LOW (ref >60)
Glucose, Bld: 182 mg/dL — ABNORMAL HIGH (ref 65–99)
Phosphorus: 3.1 mg/dL (ref 2.5–4.6)
Potassium: 3.1 mmol/L — ABNORMAL LOW (ref 3.5–5.1)
Sodium: 134 mmol/L — ABNORMAL LOW (ref 135–145)

## 2016-08-13 LAB — CBC
HCT: 27.9 % — ABNORMAL LOW (ref 39.0–52.0)
Hemoglobin: 8.3 g/dL — ABNORMAL LOW (ref 13.0–17.0)
MCH: 27.6 pg (ref 26.0–34.0)
MCHC: 29.7 g/dL — ABNORMAL LOW (ref 30.0–36.0)
MCV: 92.7 fL (ref 78.0–100.0)
Platelets: 241 10*3/uL (ref 150–400)
RBC: 3.01 MIL/uL — ABNORMAL LOW (ref 4.22–5.81)
RDW: 15.6 % — ABNORMAL HIGH (ref 11.5–15.5)
WBC: 9.9 10*3/uL (ref 4.0–10.5)

## 2016-08-13 SURGERY — EGD (ESOPHAGOGASTRODUODENOSCOPY)
Anesthesia: Monitor Anesthesia Care

## 2016-08-13 MED ORDER — POTASSIUM CHLORIDE CRYS ER 20 MEQ PO TBCR
60.0000 meq | EXTENDED_RELEASE_TABLET | Freq: Four times a day (QID) | ORAL | Status: AC
  Administered 2016-08-13 (×2): 60 meq via ORAL
  Filled 2016-08-13 (×2): qty 3

## 2016-08-13 MED ORDER — WARFARIN - PHARMACIST DOSING INPATIENT
Freq: Every day | Status: DC
  Administered 2016-08-14: 18:00:00

## 2016-08-13 MED ORDER — WARFARIN SODIUM 7.5 MG PO TABS
7.5000 mg | ORAL_TABLET | Freq: Once | ORAL | Status: AC
  Administered 2016-08-13: 7.5 mg via ORAL
  Filled 2016-08-13: qty 1

## 2016-08-13 MED ORDER — SODIUM CHLORIDE 0.9 % IV SOLN
510.0000 mg | Freq: Once | INTRAVENOUS | Status: AC
  Administered 2016-08-13: 510 mg via INTRAVENOUS
  Filled 2016-08-13: qty 17

## 2016-08-13 NOTE — Progress Notes (Signed)
PROGRESS NOTE  Isaiah [name]  ZOX:[id number] DOB: [birth year] DOA: 08/10/2016 PCP: Burman Blacksmith., MD Outpatient Specialists:  Subjective: Feels very okay, denies any complaints this morning. EGD did not show active source of bleeding. Continue diuresis, ambulate likely to be discharged in a.m.  Brief Narrative:  Isaiah [name] is a 76 y.o. male with medical history significant for hypertension, insulin-dependent diabetes mellitus, history of CVA, CAD with stent, chronic diastolic CHF, and history of DVT and atrial fibrillation on Coumadin, now presenting to the ED for evaluation of generalized weakness and shortness of breath which began the day prior. Patient had just been discharged from the hospital on 08/09/2016 after suffering an NSTEMI that was treated with DES to the proximal RCA on 08/07/2016. He was started on Plavix in place of aspirin and was restarted on his Coumadin. Hemoglobin fell from 9.6-7.9 during the hospital admission with no sign of active bleeding. Since returning home, the patient denies any chest pain or palpitations and denies any fevers or chills, but notes progressive generalized weakness and mild dyspnea without cough. He denies tenderness or swelling in the lower extremities. He denies headache, change in vision or hearing, or focal numbness or weakness. He has had a poor appetite since hospital discharge and has not eaten or drinking much at all per his son at the bedside. Patient denies abdominal pain or nausea and has not noted any melena or hematochezia.  Assessment & Plan:   Principal Problem:   Occult GI bleeding Active Problems:   Coronary artery disease   Chronic atrial fibrillation (HCC)   Essential hypertension   Type 2 diabetes mellitus with vascular disease (HCC)   Obstructive sleep apnea   CKD (chronic kidney disease), stage III   Hypokalemia   General weakness   GI bleed   Weakness     Acute on chronic diastolic CHF  -Does not have  significant lower extremity edema, but he came in with DOE.  -On Lasix and metolazone at home, switched to IV Lasix, scheduled metolazone. -Follow renal function and intake/output closely. His BMP around baseline of 1.5. -Weight is 240, continue diuresis, check BMP in a.m.  Generalized weakness, DOE  -This is likely secondary to acute on chronic diastolic CHF and anemia.  Normocytic anemia, occult GIB  - Hgb is 7.9 on admission, stable from time of recent discharge on 3/29, but down from 9.6 on 3/25 - There is no gross bleeding, no abd pain, and no nausea - FOBT is positive, INR is only 1.3. - Not stop Plavix, stent placed on 08/07/2016. - Iron studies showed iron deficiency anemia, a dose of Feraheme given today. - EGD showed no active bleeding. No colonoscopy recommended because of patient multiple comorbid conditions.  Coronary artery disease  - No anginal complaints on arrival, troponin 0.01, no acute ischemic features on EKG  - Had DES placed to proximal RCA on 08/07/16  - Continue Plavix and statin; beta-blocker discontinued during recent admission for bradycardia pending cardiology follow-up   Chronic atrial fibrillation  - CHADS-VASc at least 69 (age x2, CVA x2, CAD, DM)  - Coumadin currently held for anemia with occult GIB  - Rate is well-controlled; beta-blocker recently placed on hold for bradycardia  Insulin-dependent DM  - A1c was 10.5% in September 2014  - Managed at home with Novolog 70/30 100 units TID, plus glipizide  - Check CBG with meals and qHS  - Start Lantus 75 units BID with 10 units Novolog with meals and a high-intensity  correctional   Hypertension  - Diastolic pressures soft initially  - Enalapril held on admission given soft DBP and bump in SCr    CKD stage III  - SCr is 1.61 on admission, up from 1.2 a couple days prior, but all other recent values are in 1.6-range  - Check BMP in a.m.  Hypokalemia  - Serum potassium is 3.2 on admission with  prolonged QTc  - Replete with oral supplements, magnesium was normal yesterday.     DVT prophylaxis: SCDs, restart Coumadin Code Status: Full Code Family Communication:  Disposition Plan:  Diet: Diet heart healthy/carb modified Room service appropriate? Yes; Fluid consistency: Thin  Consultants:   Cards  GI  Procedures:   EGD  Antimicrobials:   None  Objective: Vitals:   08/12/16 1500 08/12/16 1519 08/12/16 2033 08/13/16 0440  BP: (!) 157/59 (!) 147/60 (!) 135/57 (!) 128/51  Pulse: 92 96 99 76  Resp: (!) 22  20   Temp:  98 F (36.7 C) 98.2 F (36.8 C) 97.3 F (36.3 C)  TempSrc:  Oral Oral Axillary  SpO2: 97% 94% 97% 98%  Weight:      Height:        Intake/Output Summary (Last 24 hours) at 08/13/16 1144 Last data filed at 08/13/16 0900  Gross per 24 hour  Intake              860 ml  Output             3080 ml  Net            -2220 ml   Filed Weights   08/11/16 0601 08/12/16 1125  Weight: 111.2 kg (245 lb 3.2 oz) 109.1 kg (240 lb 8 oz)    Examination: General exam: Appears calm and comfortable  Respiratory system: Clear to auscultation. Respiratory effort normal. Cardiovascular system: S1 & S2 heard, RRR. No JVD, murmurs, rubs, gallops or clicks. No pedal edema. Gastrointestinal system: Abdomen is nondistended, soft and nontender. No organomegaly or masses felt. Normal bowel sounds heard. Central nervous system: Alert and oriented. No focal neurological deficits. Extremities: Symmetric 5 x 5 power. Skin: No rashes, lesions or ulcers Psychiatry: Judgement and insight appear normal. Mood & affect appropriate.   Data Reviewed: I have personally reviewed following labs and imaging studies  CBC:  Recent Labs Lab 08/08/16 0144 08/09/16 0338 08/10/16 2323 08/12/16 0800 08/13/16 0201  WBC 10.8* 10.4 9.3 8.1 9.9  NEUTROABS  --   --  5.4  --   --   HGB 8.7* 7.9* 7.9* 8.5* 8.3*  HCT 29.8* 27.4* 26.8* 28.4* 27.9*  MCV 95.2 93.8 94.0 94.4 92.7  PLT 248  229 226 237 241   Basic Metabolic Panel:  Recent Labs Lab 08/07/16 0526 08/08/16 0144 08/10/16 2323 08/12/16 0027 08/13/16 0201  NA 137 136 143 133* 134*  K 3.9 4.0 3.2* 3.5 3.1*  CL 98* 98* 102 98* 95*  CO2 GLUCOSE 232* 228* 145* 233* 182*  BUN CREATININE 1.57* 1.20 1.61* 1.57* 1.55*  CALCIUM 8.7* 8.9 8.6* 8.3* 8.5*  MG  --   --   --  2.2  --   PHOS  --   --   --  2.5 3.1   GFR: Estimated Creatinine Clearance: 48.5 mL/min (A) (by C-G formula based on SCr of 1.55 mg/dL (H)). Liver Function Tests:  Recent Labs Lab 08/10/16 2323 08/12/16 0027 08/13/16 0201  AST  22  --   --   ALT 13*  --   --   ALKPHOS 53  --   --   BILITOT 0.4  --   --   PROT 6.7  --   --   ALBUMIN 3.2* 3.3* 3.3*   No results for input(s): LIPASE, AMYLASE in the last 168 hours. No results for input(s): AMMONIA in the last 168 hours. Coagulation Profile:  Recent Labs Lab 08/07/16 0526 08/07/16 1054 08/08/16 0144 08/09/16 0338 08/11/16 0500  INR 1.86 1.63 1.56 1.32 1.35   Cardiac Enzymes:  Recent Labs Lab 08/11/16 1310 08/11/16 1827 08/12/16 0027  TROPONINI <0.03 <0.03 <0.03   BNP (last 3 results) No results for input(s): PROBNP in the last 8760 hours. HbA1C: No results for input(s): HGBA1C in the last 72 hours. CBG:  Recent Labs Lab 08/12/16 1149 08/12/16 1736 08/12/16 2207 08/13/16 0620 08/13/16 1112  GLUCAP 186* 222* 160* 183* 225*   Lipid Profile: No results for input(s): CHOL, HDL, LDLCALC, TRIG, CHOLHDL, LDLDIRECT in the last 72 hours. Thyroid Function Tests:  Recent Labs  08/11/16 0757  TSH 2.504   Anemia Panel: No results for input(s): VITAMINB12, FOLATE, FERRITIN, TIBC, IRON, RETICCTPCT in the last 72 hours. Urine analysis:    Component Value Date/Time   COLORURINE YELLOW 08/11/2016 0219   APPEARANCEUR CLEAR 08/11/2016 0219   LABSPEC 1.013 08/11/2016 0219   PHURINE 5.0 08/11/2016 0219   GLUCOSEU NEGATIVE 08/11/2016 0219     HGBUR NEGATIVE 08/11/2016 0219   BILIRUBINUR NEGATIVE 08/11/2016 0219   KETONESUR NEGATIVE 08/11/2016 0219   PROTEINUR NEGATIVE 08/11/2016 0219   NITRITE NEGATIVE 08/11/2016 0219   LEUKOCYTESUR NEGATIVE 08/11/2016 0219   Sepsis Labs: (procalcitonin:4,lacticidven:4)  ) Recent Results (from the past 240 hour(s))  MRSA PCR Screening     Status: Abnormal   Collection Time: 08/06/16 12:20 AM  Result Value Ref Range Status   MRSA by PCR POSITIVE (A) NEGATIVE Final    Comment:        The GeneXpert MRSA Assay (FDA approved for NASAL specimens only), is one component of a comprehensive MRSA colonization surveillance program. It is not intended to diagnose MRSA infection nor to guide or monitor treatment for MRSA infections. RESULT CALLED TO, READ BACK BY AND VERIFIED WITH: HITT,L RN 1610 08/06/16 MITCHELL,L      Invalid input(s): PROCALCITONIN, LACTICACIDVEN   Radiology Studies: No results found.      Scheduled Meds: . allopurinol  100 mg Oral BID  . clopidogrel  75 mg Oral Daily  . furosemide  60 mg Intravenous BID  . gabapentin  100 mg Oral TID  . hydrALAZINE  100 mg Oral BID  . insulin aspart  0-20 Units Subcutaneous TID WC  . insulin aspart  0-5 Units Subcutaneous QHS  . insulin aspart  10 Units Subcutaneous TID WC  . insulin glargine  70 Units Subcutaneous BID  . isosorbide mononitrate  60 mg Oral Daily  . metolazone  2.5 mg Oral Daily  . multivitamin with minerals  1 tablet Oral Daily  . pantoprazole  40 mg Oral BID AC  . potassium chloride  60 mEq Oral Q6H  . pravastatin  40 mg Oral QHS  . QUEtiapine  50 mg Oral Q supper  . sodium chloride flush  3 mL Intravenous Q12H  . sodium chloride flush  3 mL Intravenous Q12H   Continuous Infusions:    LOS: 2 days    Time spent: 35 minutes    Chrissie Dacquisto A,  MD [facility] Pager [phone]  If 7PM-7AM, please contact night-coverage [email or web address] Password [email or web address] 08/13/2016, 11:44 AM

## 2016-08-13 NOTE — Progress Notes (Signed)
qPhysical Therapy Treatment Patient Details Name: Isaiah [name] MRN: [id number] DOB: 1941-04-04 Today's Date: 08/13/2016    History of Present Illness Isaiah [name] is a 76 y.o. male with medical history significant for hypertension, insulin-dependent diabetes mellitus, history of CVA, CAD with stent, chronic diastolic CHF, and history of DVT and atrial fibrillation on Coumadin, now presenting to the emergency department for evaluation of generalized weakness and shortness of breath which began the day prior. Patient had just been discharged from the hospital on 08/09/2016 after suffering an NSTEMI that was treated with DES to the proximal RCA on 08/07/2016.    PT Comments    Pt ambulated 250' with RW and min-guard A on RA with O2 sats 94%. Needed one seated rest break and when he becomes fatigued, LE's quickly become weak with buckling. Recommend use of rollator for safety. PT will continue to follow.    Follow Up Recommendations  Home health PT;Supervision/Assistance - 24 hour     Equipment Recommendations  None recommended by PT    Recommendations for Other Services       Precautions / Restrictions Precautions Precautions: Fall Precaution Comments: monitor Oxygen sats  Restrictions Weight Bearing Restrictions: No    Mobility  Bed Mobility               General bed mobility comments: pt received in chair  Transfers Overall transfer level: Needs assistance Equipment used: Rolling walker (2 wheeled) Transfers: Sit to/from Stand Sit to Stand: Supervision         General transfer comment: multiple stands from recliner with safety with supervision  Ambulation/Gait Ambulation/Gait assistance: Min guard;+2 safety/equipment Ambulation Distance (Feet): 150 Feet Assistive device: Rolling walker (2 wheeled) Gait Pattern/deviations: Step-through pattern;Trunk flexed Gait velocity: Decreased Gait velocity interpretation: Below normal speed for age/gender General Gait  Details: vc's for erect posture. Pt ambulated on RA with sats 94%, HR 104 bpm, 2/4 DOE. After 125', pt began having bilateral knee weakness with increased flexion and needed a seated rest break. Discussed use of his rollator for safety for this issue   Stairs            Wheelchair Mobility    Modified Rankin (Stroke Patients Only)       Balance Overall balance assessment: Needs assistance Sitting-balance support: No upper extremity supported;Feet supported Sitting balance-Leahy Scale: Good     Standing balance support: No upper extremity supported Standing balance-Leahy Scale: Fair Standing balance comment: can maintain static balance but needs UE support for dynamic activity                            Cognition Arousal/Alertness: Awake/alert Behavior During Therapy: WFL for tasks assessed/performed Overall Cognitive Status: Within Functional Limits for tasks assessed                                        Exercises      General Comments        Pertinent Vitals/Pain Pain Assessment: No/denies pain    Home Living                      Prior Function            PT Goals (current goals can now be found in the care plan section) Acute Rehab PT Goals Patient Stated Goal: to go home  PT Goal Formulation: With patient Time For Goal Achievement: 08/25/16 Potential to Achieve Goals: Good Progress towards PT goals: Progressing toward goals    Frequency    Min 3X/week      PT Plan Current plan remains appropriate    Co-evaluation             End of Session Equipment Utilized During Treatment: Gait belt Activity Tolerance: Patient limited by fatigue Patient left: in chair;with call bell/phone within reach Nurse Communication: Mobility status PT Visit Diagnosis: Other abnormalities of gait and mobility (R26.89);Muscle weakness (generalized) (M62.81)     Time: 1610-9604 PT Time Calculation (min) (ACUTE ONLY):  23 min  Charges:  $Gait Training: 23-37 mins                    G Codes:       Lyanne Co, PT  Acute Rehab Services  [phone]    Lawana Chambers Connelly [place] 08/13/2016, 2:46 PM

## 2016-08-13 NOTE — Procedures (Signed)
Placed patient on CPAP for the night.  Patient is tolerating well at this time. 

## 2016-08-13 NOTE — Progress Notes (Signed)
ANTICOAGULATION CONSULT NOTE - Initial Consult  Pharmacy Consult for warfarin Indication: atrial fibrillation and DVT  No Known Allergies  Patient Measurements: Height:  (170.2 cm) Weight: 240 lb 8 oz (109.1 kg) IBW/kg (Calculated) : 66.1  Vital Signs: Temp: 97.3 F (36.3 C) (04/02 0440) Temp Source: Axillary (04/02 0440) BP: 128/51 (04/02 0440) Pulse Rate: 76 (04/02 0440)  Labs:  Recent Labs  08/10/16 2323 08/11/16 0500 08/11/16 1310 08/11/16 1827 08/12/16 0027 08/12/16 0800 08/13/16 0201  HGB 7.9*  --   --   --   --  8.5* 8.3*  HCT 26.8*  --   --   --   --  28.4* 27.9*  PLT 226  --   --   --   --  237 241  LABPROT  --  16.8*  --   --   --   --   --   INR  --  1.35  --   --   --   --   --   CREATININE 1.61*  --   --   --  1.57*  --  1.55*  TROPONINI  --   --  <0.03 <0.03 <0.03  --   --     Estimated Creatinine Clearance: 48.5 mL/min (A) (by C-G formula based on SCr of 1.55 mg/dL (H)).  Assessment: CC/HPI: 76 yo m presenting with heme + stool, anemia, recent dc for NSTEMI  PMH: afib on warfarin, CAD, DVT hx, HTN, HLD  Anticoag: warfarin PTA for Afib - initially on hold d/t bleeding, resuming 4/2. Admit INR 1.35  Pta warfarin 4 mg daily - last dose 3/30  CV: clopidogrel cont d/t stent placed 3/27  GI/Nutrition: EGD shows no signs of active bleeding   Renal: Scr 1.55  Heme/Onc: H&H 8.3/27.9, Plt 241  Goal of Therapy:  INR 2-3 Monitor platelets by anticoagulation protocol: Yes   Plan:  Warfarin 7.5 mg x 1 Daily INR, CBC q72h Monitor s/sx of bleeding  Isaac Bliss, PharmD, BCPS, BCCCP Clinical Pharmacist Clinical phone for 08/13/2016 from 7a-3:30p: [phone] If after 3:30p, please call main pharmacy at: [phone] 08/13/2016 12:34 PM

## 2016-08-13 NOTE — Evaluation (Signed)
Occupational Therapy Evaluation Patient Details Name: Isaiah [name] MRN: [id number] DOB: 1940-09-28 Today's Date: 08/13/2016    History of Present Illness Isaiah [name] is a 76 y.o. male with medical history significant for hypertension, insulin-dependent diabetes mellitus, history of CVA, CAD with stent, chronic diastolic CHF, and history of DVT and atrial fibrillation on Coumadin, now presenting to the emergency department for evaluation of generalized weakness and shortness of breath which began the day prior. Patient had just been discharged from the hospital on 08/09/2016 after suffering an NSTEMI that was treated with DES to the proximal RCA on 08/07/2016.   Clinical Impression   Pt was modified independent in self care and mobility prior to admission. He presents with generalized weakness, impaired static standing balance and decreased activity tolerance interfering with ability to perform ADL at his baseline. Began education in energy conservation and will instruct in use of AE next visit.     Follow Up Recommendations  Home health OT    Equipment Recommendations  None recommended by OT    Recommendations for Other Services       Precautions / Restrictions Precautions Precautions: Fall Precaution Comments: monitor Oxygen sats, knees buckle Restrictions Weight Bearing Restrictions: No      Mobility Bed Mobility               General bed mobility comments: pt received in chair  Transfers Overall transfer level: Needs assistance Equipment used: Rolling walker (2 wheeled) Transfers: Sit to/from Stand Sit to Stand: Supervision         General transfer comment: supervision for safety    Balance Overall balance assessment: Needs assistance Sitting-balance support: No upper extremity supported;Feet supported Sitting balance-Leahy Scale: Good     Standing balance support: No upper extremity supported Standing balance-Leahy Scale: Fair Standing balance  comment: can stand statically at sink for grooming                           ADL either performed or assessed with clinical judgement   ADL Overall ADL's : Needs assistance/impaired Eating/Feeding: Independent   Grooming: Wash/dry hands;Min guard;Standing   Upper Body Bathing: Set up;Sitting   Lower Body Bathing: Minimal assistance;Sit to/from stand   Upper Body Dressing : Set up;Sitting   Lower Body Dressing: Moderate assistance;Sit to/from stand   Toilet Transfer: Min guard;Ambulation;BSC;RW           Functional mobility during ADLs: Min guard;Rolling walker General ADL Comments: Began education in energy conservation strategies. Pt is interested in practicing use of AE.     Vision Patient Visual Report: No change from baseline       Perception     Praxis      Pertinent Vitals/Pain Pain Assessment: No/denies pain     Hand Dominance Right   Extremity/Trunk Assessment Upper Extremity Assessment Upper Extremity Assessment: Overall WFL for tasks assessed   Lower Extremity Assessment Lower Extremity Assessment: Defer to PT evaluation   Cervical / Trunk Assessment Cervical / Trunk Assessment: Kyphotic   Communication Communication Communication: HOH   Cognition Arousal/Alertness: Awake/alert Behavior During Therapy: WFL for tasks assessed/performed Overall Cognitive Status: Within Functional Limits for tasks assessed                                     General Comments       Exercises     Shoulder  Instructions      Home Living Family/patient expects to be discharged to:: Private residence Living Arrangements: Children Available Help at Discharge: Family;Available 24 hours/day Type of Home: House Home Access: Level entry     Home Layout: Two level;Able to live on main level with bedroom/bathroom     Bathroom Shower/Tub: Walk-in shower   Bathroom Toilet: Handicapped height     Home Equipment: Environmental consultant - 2  wheels;Wheelchair - [employer] - 4 wheels;Bedside commode;Shower seat;Cane - single point;Adaptive equipment Adaptive Equipment: Reacher Additional Comments: daughter 24/7      Prior Functioning/Environment Level of Independence: Independent with assistive device(s)    ADL's / Homemaking Assistance Needed: struggles with LB bathing and dressing, uses reacher for IADL            OT Problem List: Decreased activity tolerance;Decreased knowledge of use of DME or AE;Obesity;Impaired balance (sitting and/or standing)      OT Treatment/Interventions: Self-care/ADL training;Energy conservation;DME and/or AE instruction;Patient/family education;Balance training    OT Goals(Current goals can be found in the care plan section) Acute Rehab OT Goals Patient Stated Goal: to go home  OT Goal Formulation: With patient Time For Goal Achievement: 08/20/16 Potential to Achieve Goals: Good ADL Goals Pt Will Perform Grooming: standing;with supervision (3 activities) Pt Will Perform Lower Body Bathing: with supervision;sit to/from stand;with adaptive equipment Pt Will Perform Lower Body Dressing: with supervision;sit to/from stand Additional ADL Goal #1: Pt will state at least 3 energy conservation strategies following education.  OT Frequency: Min 2X/week   Barriers to D/C:            Co-evaluation              End of Session Equipment Utilized During Treatment: Gait belt;Rolling walker  Activity Tolerance: Patient tolerated treatment well Patient left: in chair;with call bell/phone within reach  OT Visit Diagnosis: Unsteadiness on feet (R26.81)                Time: 1610-9604 OT Time Calculation (min): 18 min Charges:  OT General Charges $OT Visit: 1 Procedure OT Evaluation $OT Eval Moderate Complexity: 1 Procedure G-Codes:      Evern Bio 08/13/2016, 4:13 PM  [phone]

## 2016-08-13 NOTE — Progress Notes (Signed)
[facility] Progress Note  Subjective: The patient has no specific complaints today. He was seen by my partner Dr. Randa Evens in consultation for anemia and heme positive stool. He had an EGD which was negative. He states that colonoscopy was mentioned to him since he has never had one, but he was told it might be too dangerous to do right now given his recent cardiac issues.  Objective: Vital signs in last 24 hours: Temp:  [97.3 F (36.3 C)-98.2 F (36.8 C)] 97.3 F (36.3 C) (04/02 0440) Pulse Rate:  [76-99] 76 (04/02 0440) Resp:  [9-22] 20 (04/01 2033) BP: (128-157)/(51-63) 128/51 (04/02 0440) SpO2:  [94 %-98 %] 98 % (04/02 0440) Weight:  [109.1 kg (240 lb 8 oz)] 109.1 kg (240 lb 8 oz) (04/01 1125) Weight change:    PE:  No distress  Heart irregular rhythm  Lungs clear  Abdomen nontender    Lab Results: Results for orders placed or performed during the hospital encounter of 08/10/16 (from the past 24 hour(s))  Glucose, capillary     Status: Abnormal   Collection Time: 08/12/16 11:49 AM  Result Value Ref Range   Glucose-Capillary 186 (H) 65 - 99 mg/dL  Glucose, capillary     Status: Abnormal   Collection Time: 08/12/16  5:36 PM  Result Value Ref Range   Glucose-Capillary 222 (H) 65 - 99 mg/dL   Comment 1 Notify RN    Comment 2 Document in Chart   Glucose, capillary     Status: Abnormal   Collection Time: 08/12/16 10:07 PM  Result Value Ref Range   Glucose-Capillary 160 (H) 65 - 99 mg/dL  Renal function panel     Status: Abnormal   Collection Time: 08/13/16  2:01 AM  Result Value Ref Range   Sodium 134 (L) 135 - 145 mmol/L   Potassium 3.1 (L) 3.5 - 5.1 mmol/L   Chloride 95 (L) 101 - 111 mmol/L   CO2 31 22 - 32 mmol/L   Glucose, Bld 182 (H) 65 - 99 mg/dL   BUN 16 6 - 20 mg/dL   Creatinine, Ser 7.84 (H) 0.61 - 1.24 mg/dL   Calcium 8.5 (L) 8.9 - 10.3 mg/dL   Phosphorus 3.1 2.5 - 4.6 mg/dL   Albumin 3.3 (L) 3.5 - 5.0 g/dL   GFR calc non Af Amer 42 (L)  >60 mL/min   GFR calc Af Amer 49 (L) >60 mL/min   Anion gap 8 5 - 15  CBC     Status: Abnormal   Collection Time: 08/13/16  2:01 AM  Result Value Ref Range   WBC 9.9 4.0 - 10.5 K/uL   RBC 3.01 (L) 4.22 - 5.81 MIL/uL   Hemoglobin 8.3 (L) 13.0 - 17.0 g/dL   HCT 69.6 (L) 29.5 - 28.4 %   MCV 92.7 78.0 - 100.0 fL   MCH 27.6 26.0 - 34.0 pg   MCHC 29.7 (L) 30.0 - 36.0 g/dL   RDW 13.2 (H) 44.0 - 10.2 %   Platelets 241 150 - 400 K/uL  Glucose, capillary     Status: Abnormal   Collection Time: 08/13/16  6:20 AM  Result Value Ref Range   Glucose-Capillary 183 (H) 65 - 99 mg/dL    Studies/Results: No results found.    Assessment: Heme positive stool  Anemia  Plan:   He is receiving iron infusion at this time. There is still no explanation for heme positive stool and anemia. We will hold on colonoscopy at this time pending  further medical evaluation and clearance from cardiology.    SAM F Evette Cristal 08/13/2016, 10:06 AM  Pager: [phone] If no answer or after 5 PM call [phone] [facility] Progress Note  Subjective: See above  Objective: Vital signs in last 24 hours: Temp:  [97.3 F (36.3 C)-98.2 F (36.8 C)] 97.3 F (36.3 C) (04/02 0440) Pulse Rate:  [76-99] 76 (04/02 0440) Resp:  [9-22] 20 (04/01 2033) BP: (128-157)/(51-63) 128/51 (04/02 0440) SpO2:  [94 %-98 %] 98 % (04/02 0440) Weight:  [109.1 kg (240 lb 8 oz)] 109.1 kg (240 lb 8 oz) (04/01 1125) Weight change:    PE: See above  Lab Results: Results for orders placed or performed during the hospital encounter of 08/10/16 (from the past 24 hour(s))  Glucose, capillary     Status: Abnormal   Collection Time: 08/12/16 11:49 AM  Result Value Ref Range   Glucose-Capillary 186 (H) 65 - 99 mg/dL  Glucose, capillary     Status: Abnormal   Collection Time: 08/12/16  5:36 PM  Result Value Ref Range   Glucose-Capillary 222 (H) 65 - 99 mg/dL   Comment 1 Notify RN    Comment 2 Document in Chart    Glucose, capillary     Status: Abnormal   Collection Time: 08/12/16 10:07 PM  Result Value Ref Range   Glucose-Capillary 160 (H) 65 - 99 mg/dL  Renal function panel     Status: Abnormal   Collection Time: 08/13/16  2:01 AM  Result Value Ref Range   Sodium 134 (L) 135 - 145 mmol/L   Potassium 3.1 (L) 3.5 - 5.1 mmol/L   Chloride 95 (L) 101 - 111 mmol/L   CO2 31 22 - 32 mmol/L   Glucose, Bld 182 (H) 65 - 99 mg/dL   BUN 16 6 - 20 mg/dL   Creatinine, Ser 2.95 (H) 0.61 - 1.24 mg/dL   Calcium 8.5 (L) 8.9 - 10.3 mg/dL   Phosphorus 3.1 2.5 - 4.6 mg/dL   Albumin 3.3 (L) 3.5 - 5.0 g/dL   GFR calc non Af Amer 42 (L) >60 mL/min   GFR calc Af Amer 49 (L) >60 mL/min   Anion gap 8 5 - 15  CBC     Status: Abnormal   Collection Time: 08/13/16  2:01 AM  Result Value Ref Range   WBC 9.9 4.0 - 10.5 K/uL   RBC 3.01 (L) 4.22 - 5.81 MIL/uL   Hemoglobin 8.3 (L) 13.0 - 17.0 g/dL   HCT 62.1 (L) 30.8 - 65.7 %   MCV 92.7 78.0 - 100.0 fL   MCH 27.6 26.0 - 34.0 pg   MCHC 29.7 (L) 30.0 - 36.0 g/dL   RDW 84.6 (H) 96.2 - 95.2 %   Platelets 241 150 - 400 K/uL  Glucose, capillary     Status: Abnormal   Collection Time: 08/13/16  6:20 AM  Result Value Ref Range   Glucose-Capillary 183 (H) 65 - 99 mg/dL    Studies/Results: No results found.    Assessment: See above  Plan:   See above    Gwenevere Abbot 08/13/2016, 10:06 AM  Pager: [phone] If no answer or after 5 PM call [phone]

## 2016-08-14 LAB — RENAL FUNCTION PANEL
Albumin: 3.7 g/dL (ref 3.5–5.0)
Anion gap: 11 (ref 5–15)
BUN: 19 mg/dL (ref 6–20)
CO2: 30 mmol/L (ref 22–32)
Calcium: 9.4 mg/dL (ref 8.9–10.3)
Chloride: 92 mmol/L — ABNORMAL LOW (ref 101–111)
Creatinine, Ser: 1.56 mg/dL — ABNORMAL HIGH (ref 0.61–1.24)
GFR calc Af Amer: 48 mL/min — ABNORMAL LOW (ref >60)
GFR calc non Af Amer: 42 mL/min — ABNORMAL LOW (ref >60)
Glucose, Bld: 195 mg/dL — ABNORMAL HIGH (ref 65–99)
Phosphorus: 3 mg/dL (ref 2.5–4.6)
Potassium: 4 mmol/L (ref 3.5–5.1)
Sodium: 133 mmol/L — ABNORMAL LOW (ref 135–145)

## 2016-08-14 LAB — CBC
HCT: 29.5 % — ABNORMAL LOW (ref 39.0–52.0)
Hemoglobin: 8.9 g/dL — ABNORMAL LOW (ref 13.0–17.0)
MCH: 27.6 pg (ref 26.0–34.0)
MCHC: 30.2 g/dL (ref 30.0–36.0)
MCV: 91.6 fL (ref 78.0–100.0)
Platelets: 257 10*3/uL (ref 150–400)
RBC: 3.22 MIL/uL — ABNORMAL LOW (ref 4.22–5.81)
RDW: 15 % (ref 11.5–15.5)
WBC: 9.8 10*3/uL (ref 4.0–10.5)

## 2016-08-14 LAB — GLUCOSE, CAPILLARY
Glucose-Capillary: 190 mg/dL — ABNORMAL HIGH (ref 65–99)
Glucose-Capillary: 205 mg/dL — ABNORMAL HIGH (ref 65–99)
Glucose-Capillary: 303 mg/dL — ABNORMAL HIGH (ref 65–99)

## 2016-08-14 LAB — PROTIME-INR
INR: 1.13
Prothrombin Time: 14.6 seconds (ref 11.4–15.2)

## 2016-08-14 MED ORDER — METOLAZONE 2.5 MG PO TABS: 2.5000 mg | ORAL_TABLET | ORAL | 2 refills | Status: DC

## 2016-08-14 MED ORDER — WARFARIN SODIUM 7.5 MG PO TABS
7.5000 mg | ORAL_TABLET | Freq: Once | ORAL | Status: AC
  Administered 2016-08-14: 7.5 mg via ORAL
  Filled 2016-08-14: qty 1

## 2016-08-14 MED ORDER — FUROSEMIDE 40 MG PO TABS: 60.0000 mg | ORAL_TABLET | Freq: Two times a day (BID) | ORAL | 0 refills | Status: DC

## 2016-08-14 NOTE — Discharge Instructions (Signed)
Information on my medicine - Coumadin®   (Warfarin) ° °This medication education was reviewed with me or my healthcare representative as part of my discharge preparation.  The pharmacist that spoke with me during my hospital stay was:  Hind Chesler Stillinger, RPH ° °Why was Coumadin prescribed for you? °Coumadin was prescribed for you because you have a blood clot or a medical condition that can cause an increased risk of forming blood clots. Blood clots can cause serious health problems by blocking the flow of blood to the heart, lung, or brain. Coumadin can prevent harmful blood clots from forming. °As a reminder your indication for Coumadin is:   Stroke Prevention Because Of Atrial Fibrillation ° °What test will check on my response to Coumadin? °While on Coumadin (warfarin) you will need to have an INR test regularly to ensure that your dose is keeping you in the desired range. The INR (international normalized ratio) number is calculated from the result of the laboratory test called prothrombin time (PT). ° °If an INR APPOINTMENT HAS NOT ALREADY BEEN MADE FOR YOU please schedule an appointment to have this lab work done by your health care provider within 7 days. °Your INR goal is usually a number between:  2 to 3 or your provider may give you a more narrow range like 2-2.5.  Ask your health care provider during an office visit what your goal INR is. ° °What  do you need to  know  About  COUMADIN? °Take Coumadin (warfarin) exactly as prescribed by your healthcare provider about the same time each day.  DO NOT stop taking without talking to the doctor who prescribed the medication.  Stopping without other blood clot prevention medication to take the place of Coumadin may increase your risk of developing a new clot or stroke.  Get refills before you run out. ° °What do you do if you miss a dose? °If you miss a dose, take it as soon as you remember on the same day then continue your regularly scheduled  regimen the next day.  Do not take two doses of Coumadin at the same time. ° °Important Safety Information °A possible side effect of Coumadin (Warfarin) is an increased risk of bleeding. You should call your healthcare provider right away if you experience any of the following: °  Bleeding from an injury or your nose that does not stop. °  Unusual colored urine (red or dark brown) or unusual colored stools (red or black). °  Unusual bruising for unknown reasons. °  A serious fall or if you hit your head (even if there is no bleeding). ° °Some foods or medicines interact with Coumadin® (warfarin) and might alter your response to warfarin. To help avoid this: °  Eat a balanced diet, maintaining a consistent amount of Vitamin K. °  Notify your provider about major diet changes you plan to make. °  Avoid alcohol or limit your intake to 1 drink for women and 2 drinks for men per day. °(1 drink is 5 oz. wine, 12 oz. beer, or 1.5 oz. liquor.) ° °Make sure that ANY health care provider who prescribes medication for you knows that you are taking Coumadin (warfarin).  Also make sure the healthcare provider who is monitoring your Coumadin knows when you have started a new medication including herbals and non-prescription products. ° °Coumadin® (Warfarin)  Major Drug Interactions  °Increased Warfarin Effect Decreased Warfarin Effect  °Alcohol (large quantities) °Antibiotics (esp. Septra/Bactrim, Flagyl, Cipro) °Amiodarone (Cordarone) °Aspirin (  ASA) °Cimetidine (Tagamet) °Megestrol (Megace) °NSAIDs (ibuprofen, naproxen, etc.) °Piroxicam (Feldene) °Propafenone (Rythmol SR) °Propranolol (Inderal) °Isoniazid (INH) °Posaconazole (Noxafil) Barbiturates (Phenobarbital) °Carbamazepine (Tegretol) °Chlordiazepoxide (Librium) °Cholestyramine (Questran) °Griseofulvin °Oral Contraceptives °Rifampin °Sucralfate (Carafate) °Vitamin K  ° °Coumadin® (Warfarin) Major Herbal Interactions  °Increased Warfarin Effect Decreased Warfarin Effect    °Garlic °Ginseng °Ginkgo biloba Coenzyme Q10 °Green tea °St. John’s wort   ° °Coumadin® (Warfarin) FOOD Interactions  °Eat a consistent number of servings per week of foods HIGH in Vitamin K °(1 serving = ½ cup)  °Collards (cooked, or boiled & drained) °Kale (cooked, or boiled & drained) °Mustard greens (cooked, or boiled & drained) °Parsley *serving size only = ¼ cup °Spinach (cooked, or boiled & drained) °Swiss chard (cooked, or boiled & drained) °Turnip greens (cooked, or boiled & drained)  °Eat a consistent number of servings per week of foods MEDIUM-HIGH in Vitamin K °(1 serving = 1 cup)  °Asparagus (cooked, or boiled & drained) °Broccoli (cooked, boiled & drained, or raw & chopped) °Brussel sprouts (cooked, or boiled & drained) *serving size only = ½ cup °Lettuce, raw (green leaf, endive, romaine) °Spinach, raw °Turnip greens, raw & chopped  ° °These websites have more information on Coumadin (warfarin):  www.coumadin.com; °www.ahrq.gov/consumer/coumadin.htm; ° ° ° °

## 2016-08-14 NOTE — Discharge Summary (Signed)
Physician Discharge Summary  Isaiah [name] JYN:[id number] DOB: [birth year] DOA: 08/10/2016  PCP: Burman Blacksmith., MD  Admit date: 08/10/2016 Discharge date: 08/14/2016  Admitted From: Home Disposition: Home  Recommendations for Outpatient Follow-up:  1. Follow up with PCP in 1-2 weeks 2. Please obtain BMP/CBC in one week  Home Health: NA Equipment/Devices:NA  Discharge Condition: Stable CODE STATUS: Full Code Diet recommendation: Diet heart healthy/carb modified Room service appropriate? Yes; Fluid consistency: Thin Diet - low sodium heart healthy  Brief/Interim Summary: Isaiah [name] [facility] a 76 y.o.malewith medical history significant forhypertension, insulin-dependent diabetes mellitus, history of CVA, CAD with stent, chronic diastolic CHF, and history of DVT and atrial fibrillation on Coumadin, now presenting to the ED for evaluation of generalized weakness and shortness of breath which began the day prior. Patient had just been discharged from the hospital on 08/09/2016 after suffering an NSTEMI that was treated with DES to the proximal RCA on 08/07/2016. He was started on Plavix in place of aspirin and was restarted on his Coumadin. Hemoglobin fell from 9.6-7.9 during the hospital admission with no sign of active bleeding. Since returning home, the patient denies any chest pain or palpitations and denies any fevers or chills, but notes progressive generalized weakness and mild dyspnea without cough. He denies tenderness or swelling in the lower extremities. He denies headache, change in vision or hearing, or focal numbness or weakness. He has had a poor appetite since hospital discharge and has not eaten or drinking much at all per his son at the bedside. Patient denies abdominal pain or nausea and has not noted any melena or hematochezia.  Discharge Diagnoses:  Principal Problem:   Occult GI bleeding Active Problems:   Coronary artery disease   Chronic atrial fibrillation (HCC)    Essential hypertension   Type 2 diabetes mellitus with vascular disease (HCC)   Obstructive sleep apnea   CKD (chronic kidney disease), stage III   Hypokalemia   General weakness   GI bleed   Weakness   Acute on chronic diastolic CHF -Does not have significant lower extremity edema, but he came in with DOE and slightly elevated BNP.  -Recent cardiac cath showed elevated EDP, LVEF was 55-65% with PA peak pressure 44 mmHg, -On Lasix and metolazone at home, switched to IV Lasix, scheduled metolazone. -Patient improved with successful diuresis still has some DOE. -On discharge Lasix increased to 60 mg twice a day and metolazone 2.5 is scheduled to be on Monday/Wednesday/Friday. -To follow-up with cardiology in 1 week.  Generalized weakness, DOE  -This is likely secondary to acute on chronic diastolic CHF and anemia. This is improved.  Normocytic anemia, occult GIB  - Hgb is 7.9 on admission, stable from time of recent discharge on 3/29, but down from 9.6 on 3/25 - There is no gross bleeding, no abd pain, and no nausea. FOBT is positive, INR is only 1.3. - Can NOT stop Plavix, stent placed on 08/07/2016. - Iron studies showed iron deficiency anemia, a dose of IV Feraheme given. - EGD showed no active bleeding. No colonoscopy recommended because of patient multiple comorbid conditions. - GI is okay with restarting Coumadin and Plavix, to follow-up with cardiology and PCP as outpatient.  Coronary artery disease  - No anginal complaints on arrival, troponin 0.01, no acute ischemic features on EKG  - Had DES placed to proximal RCA on 08/07/16.  - Continue Plavix and statin; beta-blocker discontinued during recent admission for bradycardia pending cardiology follow-up   Chronic atrial fibrillation  -  CHADS-VASc at least 55 (age +46, CVA +71, CAD, DM)  - Coumadin held initially, and restarted after EGD done. - Rate is well-controlled.  Insulin-dependent DM, uncontrolled with  hyperglycemia  - A1c was 10.5% in 01/2013, no recent A1c. Recommend follow-up with PCP, blood sugar range was 180-230 in the hospital. - Managed at home with Novolog 70/30 100 units TID, plus glipizide  - Start Lantus 75 units BID with 10 units Novolog with meals and a high-intensity correctional   Hypertension  - Diastolic pressures soft initially  - Enalapril held on admission given soft DBP and bump in SCr   CKD stage III  - SCr is 1.61 on admission, up from 1.2 a couple days prior, but all other recent values are in 1.6-range  - Discharged with creatinine of 1.5.  Hypokalemia  - Serum potassium is 3.2 on admission with prolonged QTc  - Replete with oral supplements, magnesium was normal yesterday.     Discharge Instructions  Discharge Instructions    Diet - low sodium heart healthy    Complete by:  As directed    Increase activity slowly    Complete by:  As directed      Allergies as of 08/14/2016   No Known Allergies     Medication List    TAKE these medications   allopurinol 100 MG tablet Commonly known as:  ZYLOPRIM TAKE 2 TABLETS BY MOUTH DAILY. What changed:  See the new instructions.   clopidogrel 75 MG tablet Commonly known as:  PLAVIX Take 1 tablet (75 mg total) by mouth daily with breakfast.   enalapril 20 MG tablet Commonly known as:  VASOTEC Take 10 mg by mouth daily.   ferrous sulfate 325 (65 FE) MG tablet Take 1 tablet (325 mg total) by mouth 2 (two) times daily with a meal.   furosemide 40 MG tablet Commonly known as:  LASIX Take 1.5 tablets (60 mg total) by mouth 2 (two) times daily. What changed:  how much to take  when to take this  additional instructions   gabapentin 100 MG capsule Commonly known as:  NEURONTIN Take 100 mg by mouth 3 (three) times daily.   glipiZIDE 5 MG tablet Commonly known as:  GLUCOTROL Take 5 mg by mouth 2 (two) times daily before a meal.   hydrALAZINE 100 MG tablet Commonly known as:   APRESOLINE Take 100 mg by mouth 2 (two) times daily.   insulin aspart protamine- aspart (70-30) 100 UNIT/ML injection Commonly known as:  NOVOLOG MIX 70/30 Inject 100 Units into the skin 3 (three) times daily before meals.   isosorbide mononitrate 60 MG 24 hr tablet Commonly known as:  IMDUR Take 1 tablet (60 mg total) by mouth daily. Appointment needed for future refills   METAMUCIL PO Take 2 capsules by mouth at bedtime.   metolazone 2.5 MG tablet Commonly known as:  ZAROXOLYN Take 1 tablet (2.5 mg total) by mouth every Monday, Wednesday, and Friday. Start taking on:  08/15/2016 What changed:  when to take this  reasons to take this   multivitamin with minerals Tabs tablet Take 1 tablet by mouth daily.   pantoprazole 40 MG tablet Commonly known as:  PROTONIX Take 40 mg by mouth 2 (two) times daily before a meal.   pravastatin 40 MG tablet Commonly known as:  PRAVACHOL Take 40 mg by mouth at bedtime.   PRESCRIPTION MEDICATION Inhale into the lungs at bedtime. CPAP   QUEtiapine 50 MG tablet Commonly known as:  SEROQUEL Take 50 mg by mouth daily with supper.   traMADol 50 MG tablet Commonly known as:  ULTRAM Take 50-100 mg by mouth 3 (three) times daily as needed (pain).   warfarin 4 MG tablet Commonly known as:  COUMADIN Take 4 mg by mouth daily with supper.      Follow-up Information    [facility] T., MD Follow up in 1 week(s).   Specialty:  Family Medicine Contact information: [address] [place] [address] [phone]        Nanetta Batty, MD Follow up in 1 week(s).   Specialties:  Cardiology, Radiology Contact information: [address] [place] [address] [phone]          No Known Allergies  Consultations:  Treatment Team:   Carman Ching, MD   Procedures (Echo, Carotid, EGD, Colonoscopy, ERCP)   Radiological studies: Dg Chest 2 View  Result Date: 08/05/2016 CLINICAL DATA:  Sudden onset  of mid sternal chest pain starting at 15:30 p.m. EXAM: CHEST  2 VIEW COMPARISON:  05/03/2013 FINDINGS: Cardiomediastinal silhouette is stable. Status post CABG. No infiltrate or pulmonary edema. Osteopenia and mild degenerative changes thoracic spine. IMPRESSION: No active cardiopulmonary disease. Status post CABG. Osteopenia and mild degenerative changes thoracic spine. Electronically Signed   By: Natasha Mead M.D.   On: 08/05/2016 19:04   Ct Head Wo Contrast  Result Date: 08/11/2016 CLINICAL DATA:  Weakness. EXAM: CT HEAD WITHOUT CONTRAST TECHNIQUE: Contiguous axial images were obtained from the base of the skull through the vertex without intravenous contrast. COMPARISON:  Head CT 4 days prior 08/08/2015 FINDINGS: Brain: Stable atrophy and chronic small vessel ischemia. Remote lacunar infarct in left basal ganglia unchanged. No CT findings of acute ischemia. No hemorrhage, mass effect or midline shift. No hydrocephalus. Incidental note of dural calcifications. Vascular: Atherosclerosis of skullbase vasculature without hyperdense vessel or abnormal calcification. Skull: Normal. Negative for fracture or focal lesion. Sinuses/Orbits: Minimal mucosal thickening of left sphenoid sinus. No fluid levels. Trace opacification of lower left mastoid air cells, unchanged. Other: None. IMPRESSION: No acute intracranial abnormality. Stable atrophy and chronic small vessel ischemia. Electronically Signed   By: Rubye Oaks M.D.   On: 08/11/2016 06:48   Dg Chest Port 1 View  Result Date: 08/10/2016 CLINICAL DATA:  Shortness of breath, weakness. EXAM: PORTABLE CHEST 1 VIEW COMPARISON:  Radiographs 5 days prior 08/05/2016 FINDINGS: Stable cardiomegaly, post median sternotomy and CABG. Atherosclerosis of the thoracic aorta. Trace chronic blunting of the costophrenic angles. No pulmonary edema. No focal airspace disease. No pneumothorax. Multiple overlying monitoring devices project over the lower chest. IMPRESSION: Stable  cardiomegaly post CABG.  Thoracic aortic atherosclerosis. No acute abnormality. Electronically Signed   By: Rubye Oaks M.D.   On: 08/10/2016 23:15     Subjective:  Discharge Exam: Vitals:   08/13/16 2053 08/13/16 2341 08/14/16 0458 08/14/16 1020  BP: (!) 156/51  (!) 141/55 (!) 125/56  Pulse: 87 90 76   Resp: Temp: 97.6 F (36.4 C)  98.2 F (36.8 C)   TempSrc: Oral  Axillary   SpO2: 96% 96% 90%   Weight:   109.4 kg (241 lb 2.9 oz)   Height:       General: Pt is alert, awake, not in acute distress Cardiovascular: RRR, S1/S2 +, no rubs, no gallops Respiratory: CTA bilaterally, no wheezing, no rhonchi Abdominal: Soft, NT, ND, bowel sounds + Extremities: no edema, no cyanosis   The results of significant  diagnostics from this hospitalization (including imaging, microbiology, ancillary and laboratory) are listed below for reference.    Microbiology: Recent Results (from the past 240 hour(s))  MRSA PCR Screening     Status: Abnormal   Collection Time: 08/06/16 12:20 AM  Result Value Ref Range Status   MRSA by PCR POSITIVE (A) NEGATIVE Final    Comment:        The GeneXpert MRSA Assay (FDA approved for NASAL specimens only), is one component of a comprehensive MRSA colonization surveillance program. It is not intended to diagnose MRSA infection nor to guide or monitor treatment for MRSA infections. RESULT CALLED TO, READ BACK BY AND VERIFIED WITH: HITT,L RN 0324 08/06/16 MITCHELL,L      Labs: BNP (last 3 results)  Recent Labs  08/05/16 1925 08/10/16 2323  BNP 222.6* 121.5*   Basic Metabolic Panel:  Recent Labs Lab 08/08/16 0144 08/10/16 2323 08/12/16 0027 08/13/16 0201 08/14/16 0332  NA 136 143 133* 134* 133*  K 4.0 3.2* 3.5 3.1* 4.0  CL 98* 102 98* 95* 92*  CO2 GLUCOSE 228* 145* 233* 182* 195*  BUN CREATININE 1.20 1.61* 1.57* 1.55* 1.56*  CALCIUM 8.9 8.6* 8.3* 8.5* 9.4  MG  --   --  2.2  --   --    PHOS  --   --  2.5 3.1 3.0   Liver Function Tests:  Recent Labs Lab 08/10/16 2323 08/12/16 0027 08/13/16 0201 08/14/16 0332  AST 22  --   --   --   ALT 13*  --   --   --   ALKPHOS 53  --   --   --   BILITOT 0.4  --   --   --   PROT 6.7  --   --   --   ALBUMIN 3.2* 3.3* 3.3* 3.7   No results for input(s): LIPASE, AMYLASE in the last 168 hours. No results for input(s): AMMONIA in the last 168 hours. CBC:  Recent Labs Lab 08/09/16 0338 08/10/16 2323 08/12/16 0800 08/13/16 0201 08/14/16 0332  WBC 10.4 9.3 8.1 9.9 9.8  NEUTROABS  --  5.4  --   --   --   HGB 7.9* 7.9* 8.5* 8.3* 8.9*  HCT 27.4* 26.8* 28.4* 27.9* 29.5*  MCV 93.8 94.0 94.4 92.7 91.6  PLT 229 226 237 241 257   Cardiac Enzymes:  Recent Labs Lab 08/11/16 1310 08/11/16 1827 08/12/16 0027  TROPONINI <0.03 <0.03 <0.03   BNP: Invalid input(s): POCBNP CBG:  Recent Labs Lab 08/13/16 0620 08/13/16 1112 08/13/16 1616 08/13/16 2051 08/14/16 0618  GLUCAP 183* 225* 214* 191* 190*   D-Dimer No results for input(s): DDIMER in the last 72 hours. Hgb A1c No results for input(s): HGBA1C in the last 72 hours. Lipid Profile No results for input(s): CHOL, HDL, LDLCALC, TRIG, CHOLHDL, LDLDIRECT in the last 72 hours. Thyroid function studies No results for input(s): TSH, T4TOTAL, T3FREE, THYROIDAB in the last 72 hours.  Invalid input(s): FREET3 Anemia work up No results for input(s): VITAMINB12, FOLATE, FERRITIN, TIBC, IRON, RETICCTPCT in the last 72 hours. Urinalysis    Component Value Date/Time   COLORURINE YELLOW 08/11/2016 0219   APPEARANCEUR CLEAR 08/11/2016 0219   LABSPEC 1.013 08/11/2016 0219   PHURINE 5.0 08/11/2016 0219   GLUCOSEU NEGATIVE 08/11/2016 0219   HGBUR NEGATIVE 08/11/2016 0219   BILIRUBINUR NEGATIVE 08/11/2016 0219   KETONESUR NEGATIVE 08/11/2016 0219   PROTEINUR  NEGATIVE 08/11/2016 0219   NITRITE NEGATIVE 08/11/2016 0219   LEUKOCYTESUR NEGATIVE 08/11/2016 0219   Sepsis  Labs Invalid input(s): PROCALCITONIN,  WBC,  LACTICIDVEN Microbiology Recent Results (from the past 240 hour(s))  MRSA PCR Screening     Status: Abnormal   Collection Time: 08/06/16 12:20 AM  Result Value Ref Range Status   MRSA by PCR POSITIVE (A) NEGATIVE Final    Comment:        The GeneXpert MRSA Assay (FDA approved for NASAL specimens only), is one component of a comprehensive MRSA colonization surveillance program. It is not intended to diagnose MRSA infection nor to guide or monitor treatment for MRSA infections. RESULT CALLED TO, READ BACK BY AND VERIFIED WITH: HITT,L RN 1610 08/06/16 MITCHELL,L      Time coordinating discharge: Over 30 minutes  SIGNED:   Clint Lipps, MD  [facility] 08/14/2016, 10:46 AM Pager   If 7PM-7AM, please contact night-coverage [email or web address] Password [email or web address]

## 2016-08-14 NOTE — Care Management Note (Addendum)
Case Management Note Donn Pierini RN, BSN Unit 2W-Case Manager [phone]  Patient Details  Name: Isaiah [name] MRN: [id number] Date of Birth: [birth year]  Subjective/Objective:  Pt admitted with GIB                  Action/Plan: PTA pt lived at home with daughter had her husband- spoke with pt at bedside- per pt he has all needed DME- and was active with [facility] services that were just set up on his last recent discharge- per pt he would like to continue those services and per PT/OT current recommendations for [facility]- pt was set up with [facility]- and per his choice wants to continue with them. Notified MD for new orders- call made to [facility]- confirmed they had referral- and new orders faxed to [phone] for HHPT/OT via epic-   Expected Discharge Date:  08/14/16               Expected Discharge Plan:  Home w Home Health Services  In-House Referral:     Discharge planning Services  CM Consult  Post Acute Care Choice:  Home Health, Resumption of Svcs/PTA Provider Choice offered to:  Patient  DME Arranged:  N/A DME Agency:  NA  HH Arranged:  PT, OT HH Agency:  [facility]  Status of Service:  Completed, signed off  If discussed at Long Length of Stay Meetings, dates discussed:    Discharge Disposition: home with home health   Additional Comments:  Darrold Span, RN 08/14/2016, 12:26 PM

## 2016-08-14 NOTE — Progress Notes (Signed)
qPhysical Therapy Treatment Patient Details Name: Isaiah [name] MRN: [id number] DOB: 1941/04/24 Today's Date: 08/14/2016    History of Present Illness Isaiah [name] is a 76 y.o. male with medical history significant for hypertension, insulin-dependent diabetes mellitus, history of CVA, CAD with stent, chronic diastolic CHF, and history of DVT and atrial fibrillation on Coumadin, now presenting to the emergency department for evaluation of generalized weakness and shortness of breath which began the day prior. Patient had just been discharged from the hospital on 08/09/2016 after suffering an NSTEMI that was treated with DES to the proximal RCA on 08/07/2016.    PT Comments    Pt reports feeling confident with going home, states that he will have family around to help him if needed. Pt able to ambulate 150 ft with rollator and no oxygen (SpO2 98 % up return to room). At this time the pt denies any questions or concerns. Will continue to follow to progress mobility and safety.    Follow Up Recommendations  Home health PT;Supervision/Assistance - 24 hour     Equipment Recommendations  None recommended by PT    Recommendations for Other Services       Precautions / Restrictions Precautions Precautions: Fall Precaution Comments: monitor Oxygen sats, knees buckle Restrictions Weight Bearing Restrictions: No    Mobility  Bed Mobility               General bed mobility comments: sitting EOB upon arrival  Transfers Overall transfer level: Needs assistance Equipment used: Rolling walker (2 wheeled) Transfers: Sit to/from Stand Sit to Stand: Supervision         General transfer comment: supervision for safety  Ambulation/Gait Ambulation/Gait assistance: Supervision Ambulation Distance (Feet): 150 Feet (plus 75 with seated rest between)   Gait Pattern/deviations: Step-through pattern;Trunk flexed Gait velocity: Decreased   General Gait Details: cues for posture and use  of brakes with transfers. SpO2 98% upon return to room.    Stairs            Wheelchair Mobility    Modified Rankin (Stroke Patients Only)       Balance Overall balance assessment: Needs assistance Sitting-balance support: No upper extremity supported;Feet supported Sitting balance-Leahy Scale: Good     Standing balance support: During functional activity Standing balance-Leahy Scale: Fair Standing balance comment: using walker for ambulation                            Cognition Arousal/Alertness: Awake/alert Behavior During Therapy: WFL for tasks assessed/performed Overall Cognitive Status: Within Functional Limits for tasks assessed                                        Exercises      General Comments        Pertinent Vitals/Pain Pain Assessment: No/denies pain    Home Living                      Prior Function            PT Goals (current goals can now be found in the care plan section) Acute Rehab PT Goals Patient Stated Goal: to go home  PT Goal Formulation: With patient Time For Goal Achievement: 08/25/16 Potential to Achieve Goals: Good Progress towards PT goals: Progressing toward goals    Frequency  Min 3X/week      PT Plan Current plan remains appropriate    Co-evaluation             End of Session   Activity Tolerance: Patient limited by fatigue Patient left: with call bell/phone within reach (sitting EOB per request) Nurse Communication: Mobility status PT Visit Diagnosis: Other abnormalities of gait and mobility (R26.89);Muscle weakness (generalized) (M62.81)     Time: 1610-9604 PT Time Calculation (min) (ACUTE ONLY): 20 min  Charges:  $Gait Training: 8-22 mins                    G Codes:       Christiane Ha, PT, CSCS Pager [phone] Office [phone] 08/14/2016, 2:25 PM

## 2016-08-14 NOTE — Progress Notes (Signed)
ANTICOAGULATION CONSULT NOTE - Follow Up Consult  Pharmacy Consult for Coumadin Indication: afib and h/o DVT  No Known Allergies  Patient Measurements: Height:  (170.2 cm) Weight: 241 lb 2.9 oz (109.4 kg) IBW/kg (Calculated) : 66.1  Vital Signs: Temp: 98.2 F (36.8 C) (04/03 0458) Temp Source: Axillary (04/03 0458) BP: 141/55 (04/03 0458) Pulse Rate: 76 (04/03 0458)  Labs:  Recent Labs  08/11/16 1310 08/11/16 1827 08/12/16 0027  08/12/16 0800 08/13/16 0201 08/14/16 0332  HGB  --   --   --   < > 8.5* 8.3* 8.9*  HCT  --   --   --   --  28.4* 27.9* 29.5*  PLT  --   --   --   --  237 241 257  LABPROT  --   --   --   --   --   --  14.6  INR  --   --   --   --   --   --  1.13  CREATININE  --   --  1.57*  --   --  1.55* 1.56*  TROPONINI <0.03 <0.03 <0.03  --   --   --   --   < > = values in this interval not displayed.  Estimated Creatinine Clearance: 48.3 mL/min (A) (by C-G formula based on SCr of 1.56 mg/dL (H)).  Assessment:  Anticoag: Warfarin PTA for Afib and h/o DVT- initially on hold d/t bleeding, resuming 4/2. INR 1.13 today. Hgb 8.9 up slightly. - Pta warfarin 4 mg daily - last dose 3/30. Admit INR 1.35   Goal of Therapy:  INR 2-3 Monitor platelets by anticoagulation protocol: Yes   Plan:  Warfarin 7.5 mg x 1 again today Daily INR, CBC q72h Monitor s/sx of bleeding  Philicia Heyne S. Merilynn Finland, PharmD, BCPS Clinical Staff Pharmacist Pager [phone]  Misty Stanley Stillinger 08/14/2016,9:53 AM

## 2016-08-14 NOTE — Progress Notes (Signed)
08/14/2016 1700 Discharge AVS meds taken today and those due this evening reviewed.  Follow-up appointments and when to call md reviewed.  D/C IV and TELE.  Questions and concerns addressed.   D/C home per orders. Kathryne Hitch

## 2016-08-14 NOTE — Progress Notes (Signed)
Occupational Therapy Treatment Patient Details Name: Isaiah [name] MRN: [id number] DOB: [birth year] Today's Date: 08/14/2016    History of present illness Isaiah [name] is a 76 y.o. male with medical history significant for hypertension, insulin-dependent diabetes mellitus, history of CVA, CAD with stent, chronic diastolic CHF, and history of DVT and atrial fibrillation on Coumadin, now presenting to the emergency department for evaluation of generalized weakness and shortness of breath which began the day prior. Patient had just been discharged from the hospital on 08/09/2016 after suffering an NSTEMI that was treated with DES to the proximal RCA on 08/07/2016.   OT comments  This 76 yo male admitted with above presents to acute OT with education completed today on energy conservation strategies and AE. Pt will continue to benefit from OT services for increasing independence with basic ADLs.   Follow Up Recommendations  Home health OT;Supervision/Assistance - 24 hour    Equipment Recommendations  None recommended by OT       Precautions / Restrictions Precautions Precautions: Fall Precaution Comments: monitor Oxygen sats, knees buckle Restrictions Weight Bearing Restrictions: No              ADL either performed or assessed with clinical judgement   ADL                                         General ADL Comments: Educated pt on energy conservation strategies from handout that would be of benefit to him as well as educating on purse lipped breathing. Educated pt on use of sock aid and where he could purchase one.     Vision Patient Visual Report: No change from baseline            Cognition Arousal/Alertness: Awake/alert Behavior During Therapy: WFL for tasks assessed/performed Overall Cognitive Status: Within Functional Limits for tasks assessed                                                     Pertinent Vitals/ Pain        Pain Assessment: No/denies pain         Frequency  Min 2X/week        Progress Toward Goals  OT Goals(current goals can now be found in the care plan section)  Progress towards OT goals: Progressing toward goals     Plan Discharge plan remains appropriate          Activity Tolerance Patient tolerated treatment well   Patient Left in chair;with call bell/phone within reach   Nurse Communication          Time: 1027-2536 OT Time Calculation (min): 22 min  Charges: OT General Charges $OT Visit: 1 Procedure OT Treatments $Self Care/Home Management : 8-22 mins Ignacia Palma, OTR/L [phone] 08/14/2016

## 2016-08-15 DIAGNOSIS — E1151 Type 2 diabetes mellitus with diabetic peripheral angiopathy without gangrene: Secondary | ICD-10-CM | POA: Diagnosis not present

## 2016-08-15 DIAGNOSIS — M6281 Muscle weakness (generalized): Secondary | ICD-10-CM | POA: Diagnosis not present

## 2016-08-15 DIAGNOSIS — R2689 Other abnormalities of gait and mobility: Secondary | ICD-10-CM | POA: Diagnosis not present

## 2016-08-15 DIAGNOSIS — N183 Chronic kidney disease, stage 3 (moderate): Secondary | ICD-10-CM | POA: Diagnosis not present

## 2016-08-15 DIAGNOSIS — I5033 Acute on chronic diastolic (congestive) heart failure: Secondary | ICD-10-CM | POA: Diagnosis not present

## 2016-08-15 DIAGNOSIS — I200 Unstable angina: Secondary | ICD-10-CM | POA: Diagnosis not present

## 2016-08-15 DIAGNOSIS — Z7901 Long term (current) use of anticoagulants: Secondary | ICD-10-CM | POA: Diagnosis not present

## 2016-08-15 DIAGNOSIS — Z9181 History of falling: Secondary | ICD-10-CM | POA: Diagnosis not present

## 2016-08-15 DIAGNOSIS — Z794 Long term (current) use of insulin: Secondary | ICD-10-CM | POA: Diagnosis not present

## 2016-08-15 DIAGNOSIS — I130 Hypertensive heart and chronic kidney disease with heart failure and stage 1 through stage 4 chronic kidney disease, or unspecified chronic kidney disease: Secondary | ICD-10-CM | POA: Diagnosis not present

## 2016-08-15 DIAGNOSIS — I214 Non-ST elevation (NSTEMI) myocardial infarction: Secondary | ICD-10-CM | POA: Diagnosis not present

## 2016-08-15 DIAGNOSIS — E1122 Type 2 diabetes mellitus with diabetic chronic kidney disease: Secondary | ICD-10-CM | POA: Diagnosis not present

## 2016-08-16 ENCOUNTER — Encounter (HOSPITAL_COMMUNITY): Payer: Self-pay | Admitting: Gastroenterology

## 2016-08-17 DIAGNOSIS — I5033 Acute on chronic diastolic (congestive) heart failure: Secondary | ICD-10-CM | POA: Diagnosis not present

## 2016-08-17 DIAGNOSIS — E1122 Type 2 diabetes mellitus with diabetic chronic kidney disease: Secondary | ICD-10-CM | POA: Diagnosis not present

## 2016-08-17 DIAGNOSIS — N183 Chronic kidney disease, stage 3 (moderate): Secondary | ICD-10-CM | POA: Diagnosis not present

## 2016-08-17 DIAGNOSIS — I130 Hypertensive heart and chronic kidney disease with heart failure and stage 1 through stage 4 chronic kidney disease, or unspecified chronic kidney disease: Secondary | ICD-10-CM | POA: Diagnosis not present

## 2016-08-17 DIAGNOSIS — R2689 Other abnormalities of gait and mobility: Secondary | ICD-10-CM | POA: Diagnosis not present

## 2016-08-17 DIAGNOSIS — M6281 Muscle weakness (generalized): Secondary | ICD-10-CM | POA: Diagnosis not present

## 2016-08-20 ENCOUNTER — Encounter: Payer: Self-pay | Admitting: Family

## 2016-08-20 DIAGNOSIS — M6281 Muscle weakness (generalized): Secondary | ICD-10-CM | POA: Diagnosis not present

## 2016-08-20 DIAGNOSIS — I130 Hypertensive heart and chronic kidney disease with heart failure and stage 1 through stage 4 chronic kidney disease, or unspecified chronic kidney disease: Secondary | ICD-10-CM | POA: Diagnosis not present

## 2016-08-20 DIAGNOSIS — E1122 Type 2 diabetes mellitus with diabetic chronic kidney disease: Secondary | ICD-10-CM | POA: Diagnosis not present

## 2016-08-20 DIAGNOSIS — R2689 Other abnormalities of gait and mobility: Secondary | ICD-10-CM | POA: Diagnosis not present

## 2016-08-20 DIAGNOSIS — I5033 Acute on chronic diastolic (congestive) heart failure: Secondary | ICD-10-CM | POA: Diagnosis not present

## 2016-08-20 DIAGNOSIS — N183 Chronic kidney disease, stage 3 (moderate): Secondary | ICD-10-CM | POA: Diagnosis not present

## 2016-08-21 ENCOUNTER — Ambulatory Visit (INDEPENDENT_AMBULATORY_CARE_PROVIDER_SITE_OTHER): Payer: Medicare Other | Admitting: Physician Assistant

## 2016-08-21 ENCOUNTER — Inpatient Hospital Stay (HOSPITAL_COMMUNITY)
Admission: EM | Admit: 2016-08-21 | Discharge: 2016-08-23 | DRG: 682 | Disposition: A | Discharge status: Home / Self Care | Payer: Medicare Other | Attending: Internal Medicine | Admitting: Internal Medicine

## 2016-08-21 ENCOUNTER — Encounter (HOSPITAL_COMMUNITY): Payer: Self-pay

## 2016-08-21 ENCOUNTER — Encounter: Payer: Self-pay | Admitting: Physician Assistant

## 2016-08-21 ENCOUNTER — Emergency Department (HOSPITAL_COMMUNITY): Payer: Medicare Other

## 2016-08-21 VITALS — BP 76/44 | HR 96 | Ht 67.0 in | Wt 244.2 lb

## 2016-08-21 DIAGNOSIS — E860 Dehydration: Secondary | ICD-10-CM | POA: Diagnosis present

## 2016-08-21 DIAGNOSIS — E871 Hypo-osmolality and hyponatremia: Secondary | ICD-10-CM | POA: Diagnosis not present

## 2016-08-21 DIAGNOSIS — G4733 Obstructive sleep apnea (adult) (pediatric): Secondary | ICD-10-CM | POA: Diagnosis present

## 2016-08-21 DIAGNOSIS — I10 Essential (primary) hypertension: Secondary | ICD-10-CM | POA: Diagnosis present

## 2016-08-21 DIAGNOSIS — E1121 Type 2 diabetes mellitus with diabetic nephropathy: Secondary | ICD-10-CM | POA: Diagnosis present

## 2016-08-21 DIAGNOSIS — E1122 Type 2 diabetes mellitus with diabetic chronic kidney disease: Secondary | ICD-10-CM | POA: Diagnosis present

## 2016-08-21 DIAGNOSIS — Z87891 Personal history of nicotine dependence: Secondary | ICD-10-CM | POA: Diagnosis not present

## 2016-08-21 DIAGNOSIS — I214 Non-ST elevation (NSTEMI) myocardial infarction: Secondary | ICD-10-CM | POA: Diagnosis present

## 2016-08-21 DIAGNOSIS — I482 Chronic atrial fibrillation, unspecified: Secondary | ICD-10-CM | POA: Diagnosis present

## 2016-08-21 DIAGNOSIS — I2510 Atherosclerotic heart disease of native coronary artery without angina pectoris: Secondary | ICD-10-CM

## 2016-08-21 DIAGNOSIS — E1159 Type 2 diabetes mellitus with other circulatory complications: Secondary | ICD-10-CM | POA: Diagnosis present

## 2016-08-21 DIAGNOSIS — Z794 Long term (current) use of insulin: Secondary | ICD-10-CM

## 2016-08-21 DIAGNOSIS — N183 Chronic kidney disease, stage 3 unspecified: Secondary | ICD-10-CM | POA: Diagnosis present

## 2016-08-21 DIAGNOSIS — Z86718 Personal history of other venous thrombosis and embolism: Secondary | ICD-10-CM

## 2016-08-21 DIAGNOSIS — E785 Hyperlipidemia, unspecified: Secondary | ICD-10-CM | POA: Diagnosis present

## 2016-08-21 DIAGNOSIS — E1151 Type 2 diabetes mellitus with diabetic peripheral angiopathy without gangrene: Secondary | ICD-10-CM | POA: Diagnosis present

## 2016-08-21 DIAGNOSIS — Z955 Presence of coronary angioplasty implant and graft: Secondary | ICD-10-CM

## 2016-08-21 DIAGNOSIS — Y92019 Unspecified place in single-family (private) house as the place of occurrence of the external cause: Secondary | ICD-10-CM

## 2016-08-21 DIAGNOSIS — Z7901 Long term (current) use of anticoagulants: Secondary | ICD-10-CM | POA: Diagnosis not present

## 2016-08-21 DIAGNOSIS — Z951 Presence of aortocoronary bypass graft: Secondary | ICD-10-CM | POA: Diagnosis not present

## 2016-08-21 DIAGNOSIS — I951 Orthostatic hypotension: Secondary | ICD-10-CM

## 2016-08-21 DIAGNOSIS — I130 Hypertensive heart and chronic kidney disease with heart failure and stage 1 through stage 4 chronic kidney disease, or unspecified chronic kidney disease: Secondary | ICD-10-CM | POA: Diagnosis present

## 2016-08-21 DIAGNOSIS — Z79891 Long term (current) use of opiate analgesic: Secondary | ICD-10-CM

## 2016-08-21 DIAGNOSIS — Z833 Family history of diabetes mellitus: Secondary | ICD-10-CM

## 2016-08-21 DIAGNOSIS — I5032 Chronic diastolic (congestive) heart failure: Secondary | ICD-10-CM | POA: Diagnosis not present

## 2016-08-21 DIAGNOSIS — Z7902 Long term (current) use of antithrombotics/antiplatelets: Secondary | ICD-10-CM

## 2016-08-21 DIAGNOSIS — I480 Paroxysmal atrial fibrillation: Secondary | ICD-10-CM | POA: Diagnosis present

## 2016-08-21 DIAGNOSIS — N179 Acute kidney failure, unspecified: Secondary | ICD-10-CM | POA: Diagnosis not present

## 2016-08-21 DIAGNOSIS — E1165 Type 2 diabetes mellitus with hyperglycemia: Secondary | ICD-10-CM | POA: Diagnosis present

## 2016-08-21 DIAGNOSIS — D72829 Elevated white blood cell count, unspecified: Secondary | ICD-10-CM | POA: Diagnosis present

## 2016-08-21 DIAGNOSIS — Z9981 Dependence on supplemental oxygen: Secondary | ICD-10-CM

## 2016-08-21 DIAGNOSIS — J9811 Atelectasis: Secondary | ICD-10-CM | POA: Diagnosis not present

## 2016-08-21 DIAGNOSIS — D649 Anemia, unspecified: Secondary | ICD-10-CM | POA: Diagnosis present

## 2016-08-21 DIAGNOSIS — Z8673 Personal history of transient ischemic attack (TIA), and cerebral infarction without residual deficits: Secondary | ICD-10-CM | POA: Diagnosis not present

## 2016-08-21 DIAGNOSIS — Z8249 Family history of ischemic heart disease and other diseases of the circulatory system: Secondary | ICD-10-CM

## 2016-08-21 DIAGNOSIS — T502X5A Adverse effect of carbonic-anhydrase inhibitors, benzothiadiazides and other diuretics, initial encounter: Secondary | ICD-10-CM | POA: Diagnosis present

## 2016-08-21 DIAGNOSIS — Z79899 Other long term (current) drug therapy: Secondary | ICD-10-CM

## 2016-08-21 LAB — COMPREHENSIVE METABOLIC PANEL
ALT: 17 U/L (ref 17–63)
AST: 28 U/L (ref 15–41)
Albumin: 4 g/dL (ref 3.5–5.0)
Alkaline Phosphatase: 71 U/L (ref 38–126)
Anion gap: 15 (ref 5–15)
BUN: 63 mg/dL — ABNORMAL HIGH (ref 6–20)
CO2: 25 mmol/L (ref 22–32)
Calcium: 9.2 mg/dL (ref 8.9–10.3)
Chloride: 81 mmol/L — ABNORMAL LOW (ref 101–111)
Creatinine, Ser: 3.09 mg/dL — ABNORMAL HIGH (ref 0.61–1.24)
GFR calc Af Amer: 21 mL/min — ABNORMAL LOW (ref >60)
GFR calc non Af Amer: 18 mL/min — ABNORMAL LOW (ref >60)
Glucose, Bld: 261 mg/dL — ABNORMAL HIGH (ref 65–99)
Potassium: 4.5 mmol/L (ref 3.5–5.1)
Sodium: 121 mmol/L — ABNORMAL LOW (ref 135–145)
Total Bilirubin: 0.6 mg/dL (ref 0.3–1.2)
Total Protein: 8.2 g/dL — ABNORMAL HIGH (ref 6.5–8.1)

## 2016-08-21 LAB — POC OCCULT BLOOD, ED: Fecal Occult Bld: NEGATIVE

## 2016-08-21 LAB — I-STAT TROPONIN, ED: Troponin i, poc: 0.02 ng/mL (ref 0.00–0.08)

## 2016-08-21 LAB — URINALYSIS, ROUTINE W REFLEX MICROSCOPIC
Bilirubin Urine: NEGATIVE
Glucose, UA: NEGATIVE mg/dL
Hgb urine dipstick: NEGATIVE
Ketones, ur: NEGATIVE mg/dL
Leukocytes, UA: NEGATIVE
Nitrite: NEGATIVE
Protein, ur: NEGATIVE mg/dL
Specific Gravity, Urine: 1.006 (ref 1.005–1.030)
pH: 5 (ref 5.0–8.0)

## 2016-08-21 LAB — CBC
HCT: 32 % — ABNORMAL LOW (ref 39.0–52.0)
Hemoglobin: 10.3 g/dL — ABNORMAL LOW (ref 13.0–17.0)
MCH: 29.1 pg (ref 26.0–34.0)
MCHC: 32.2 g/dL (ref 30.0–36.0)
MCV: 90.4 fL (ref 78.0–100.0)
Platelets: 310 10*3/uL (ref 150–400)
RBC: 3.54 MIL/uL — ABNORMAL LOW (ref 4.22–5.81)
RDW: 17.2 % — ABNORMAL HIGH (ref 11.5–15.5)
WBC: 11.3 10*3/uL — ABNORMAL HIGH (ref 4.0–10.5)

## 2016-08-21 LAB — TYPE AND SCREEN
ABO/RH(D): A POS
Antibody Screen: NEGATIVE

## 2016-08-21 LAB — BRAIN NATRIURETIC PEPTIDE: B Natriuretic Peptide: 120.7 pg/mL — ABNORMAL HIGH (ref 0.0–100.0)

## 2016-08-21 LAB — PROTIME-INR
INR: 1.96
Prothrombin Time: 22.7 seconds — ABNORMAL HIGH (ref 11.4–15.2)

## 2016-08-21 LAB — GLUCOSE, CAPILLARY: Glucose-Capillary: 170 mg/dL — ABNORMAL HIGH (ref 65–99)

## 2016-08-21 MED ORDER — PRAVASTATIN SODIUM 40 MG PO TABS
40.0000 mg | ORAL_TABLET | Freq: Every day | ORAL | Status: DC
  Administered 2016-08-21 – 2016-08-22 (×2): 40 mg via ORAL
  Filled 2016-08-21 (×2): qty 1

## 2016-08-21 MED ORDER — TRAMADOL HCL 50 MG PO TABS: 50.0000 mg | ORAL_TABLET | Freq: Two times a day (BID) | ORAL | Status: DC | PRN

## 2016-08-21 MED ORDER — ALLOPURINOL 100 MG PO TABS
100.0000 mg | ORAL_TABLET | Freq: Two times a day (BID) | ORAL | Status: DC
  Administered 2016-08-21 – 2016-08-23 (×4): 100 mg via ORAL
  Filled 2016-08-21 (×4): qty 1

## 2016-08-21 MED ORDER — INSULIN ASPART PROT & ASPART (70-30 MIX) 100 UNIT/ML ~~LOC~~ SUSP
30.0000 [IU] | Freq: Three times a day (TID) | SUBCUTANEOUS | Status: DC
  Administered 2016-08-22 – 2016-08-23 (×5): 30 [IU] via SUBCUTANEOUS
  Filled 2016-08-21 (×2): qty 10

## 2016-08-21 MED ORDER — CLOPIDOGREL BISULFATE 75 MG PO TABS
75.0000 mg | ORAL_TABLET | Freq: Every day | ORAL | Status: DC
  Administered 2016-08-22 – 2016-08-23 (×2): 75 mg via ORAL
  Filled 2016-08-21 (×2): qty 1

## 2016-08-21 MED ORDER — INSULIN ASPART 100 UNIT/ML ~~LOC~~ SOLN
0.0000 [IU] | Freq: Three times a day (TID) | SUBCUTANEOUS | Status: DC
  Administered 2016-08-22 (×2): 3 [IU] via SUBCUTANEOUS
  Administered 2016-08-22: 2 [IU] via SUBCUTANEOUS
  Administered 2016-08-23: 3 [IU] via SUBCUTANEOUS
  Administered 2016-08-23: 7 [IU] via SUBCUTANEOUS

## 2016-08-21 MED ORDER — SODIUM CHLORIDE 0.9% FLUSH
3.0000 mL | Freq: Two times a day (BID) | INTRAVENOUS | Status: DC
  Administered 2016-08-21 – 2016-08-23 (×3): 3 mL via INTRAVENOUS

## 2016-08-21 MED ORDER — ONDANSETRON HCL 4 MG/2ML IJ SOLN: 4.0000 mg | Freq: Four times a day (QID) | INTRAMUSCULAR | Status: DC | PRN

## 2016-08-21 MED ORDER — WARFARIN SODIUM 2 MG PO TABS: 4.0000 mg | ORAL_TABLET | Freq: Once | ORAL | Status: DC

## 2016-08-21 MED ORDER — PANTOPRAZOLE SODIUM 40 MG PO TBEC
40.0000 mg | DELAYED_RELEASE_TABLET | Freq: Two times a day (BID) | ORAL | Status: DC
  Administered 2016-08-22 – 2016-08-23 (×3): 40 mg via ORAL
  Filled 2016-08-21 (×3): qty 1

## 2016-08-21 MED ORDER — QUETIAPINE FUMARATE 25 MG PO TABS
50.0000 mg | ORAL_TABLET | Freq: Every day | ORAL | Status: DC
  Administered 2016-08-21 – 2016-08-22 (×2): 50 mg via ORAL
  Filled 2016-08-21 (×2): qty 2

## 2016-08-21 MED ORDER — SODIUM CHLORIDE 0.9 % IV SOLN
INTRAVENOUS | Status: AC
  Administered 2016-08-21: 23:00:00 via INTRAVENOUS

## 2016-08-21 MED ORDER — GABAPENTIN 100 MG PO CAPS
100.0000 mg | ORAL_CAPSULE | Freq: Three times a day (TID) | ORAL | Status: DC
  Administered 2016-08-21 – 2016-08-23 (×5): 100 mg via ORAL
  Filled 2016-08-21 (×5): qty 1

## 2016-08-21 MED ORDER — FERROUS SULFATE 325 (65 FE) MG PO TABS
325.0000 mg | ORAL_TABLET | Freq: Two times a day (BID) | ORAL | Status: DC
  Administered 2016-08-22 – 2016-08-23 (×3): 325 mg via ORAL
  Filled 2016-08-21 (×3): qty 1

## 2016-08-21 MED ORDER — ISOSORBIDE MONONITRATE ER 60 MG PO TB24
60.0000 mg | ORAL_TABLET | Freq: Every day | ORAL | Status: DC
  Administered 2016-08-22 – 2016-08-23 (×2): 60 mg via ORAL
  Filled 2016-08-21 (×2): qty 1

## 2016-08-21 MED ORDER — WARFARIN - PHARMACIST DOSING INPATIENT
Freq: Every day | Status: DC
  Administered 2016-08-22: 18:00:00

## 2016-08-21 MED ORDER — HYDRALAZINE HCL 20 MG/ML IJ SOLN: 5.0000 mg | INTRAMUSCULAR | Status: DC | PRN

## 2016-08-21 MED ORDER — SODIUM CHLORIDE 0.9 % IV BOLUS (SEPSIS)
500.0000 mL | Freq: Once | INTRAVENOUS | Status: AC
  Administered 2016-08-21: 500 mL via INTRAVENOUS

## 2016-08-21 MED ORDER — ONDANSETRON HCL 4 MG PO TABS: 4.0000 mg | ORAL_TABLET | Freq: Four times a day (QID) | ORAL | Status: DC | PRN

## 2016-08-21 NOTE — Patient Instructions (Addendum)
Medication Instructions:  STOP METOLAZONE HOLD LASIX TODAY-TAKE TOMORROW, OR AS DIRECTED  If you need a refill on your cardiac medications before your next appointment, please call your pharmacy.  Follow-Up: Your physician wants you to follow-up in: 1 MONTH WITH DR BERRY OR RHONDA WHEN DR BERRY IS HERE   Special Instructions:  GO DIRECTLY TO THE ER FOR EVALUATION-WEAKNESS AND HYPOTENSION   Thank you for choosing [facility] at [facility]!!     RHONDA BARRETT, PA-C Marcelino Duster, LPN

## 2016-08-21 NOTE — Progress Notes (Signed)
ANTICOAGULATION CONSULT NOTE - Initial Consult  Pharmacy Consult for warfarin Indication: atrial fibrillation and history of DVT  No Known Allergies  Vital Signs: Temp: 98.2 F (36.8 C) (04/10 1152) Temp Source: Oral (04/10 1152) BP: 161/59 (04/10 1745) Pulse Rate: 87 (04/10 1745)  Labs:  Recent Labs  08/21/16 1156  HGB 10.3*  HCT 32.0*  PLT 310  LABPROT 22.7*  INR 1.96  CREATININE 3.09*    Estimated Creatinine Clearance: 24.5 mL/min (A) (by C-G formula based on SCr of 3.09 mg/dL (H)).   Medical History: Past Medical History:  Diagnosis Date  . Atrial fibrillation (HCC)   . CAD (coronary artery disease)   . CHF (congestive heart failure) (HCC)   . Diabetes mellitus   . DVT (deep venous thrombosis) (HCC)   . Fall at home 10/2015  . Hyperlipidemia   . Hypertension   . Left-sided carotid artery disease (HCC)   . Myocardial infarction   . NSTEMI (non-ST elevated myocardial infarction) (HCC)   . Peripheral vascular disease (HCC)   . Stroke (HCC)   . Venous insufficiency     Assessment: 76 yo male with history of afib. Pharmacy consulted to dose warfarin. Spoke to patient, took last dose 4/9 at 1800. He takes  daily.  Diagnosed with GI bleed 08/12/16 but source was unclear. INR today 1.96, hgb 10.3 - up since last admission.  Goal of Therapy:  INR 2-3 Monitor platelets by anticoagulation protocol: Yes   Plan:  Warfarin *1 tonight Daily INR CBC as indicated  Sherron Monday 08/21/2016,6:34 PM

## 2016-08-21 NOTE — ED Notes (Signed)
Admitting at bedside 

## 2016-08-21 NOTE — Progress Notes (Signed)
Cardiology Office Note   Date:  08/21/2016   ID:  Isaiah [name], DOB 09/08/1940, MRN [id number]  PCP:  Burman Blacksmith., MD  Cardiologist:  Dr. Allyson Sabal, 08/17/2015 Isaiah Demark, PA-C   Chief Complaint  Patient presents with  . Follow-up    stent    History of Present Illness: Isaiah [name] is a 76 y.o. male with a history of HTN, IDDM, CVA, D-CHF, DVT, PAF on coumadin,   Non-STEMI 07/2016 with DES-RCA, on Plavix and Coumadin, DC 08/09/2016 Admit 03/30-04/07/2016 for acute on chronic diastolic CHF and anemia with FOBT positive, INR was subtherapeutic and patient was iron deficient. Continue Coumadin and Plavix, follow-up as outpatient. Creatinine 1.5 at discharge. Patient discharged on home O2  [name] presents for post-hospital follow up.  He states he was doing ok till this am. His BP was 106/44 this am, so he did not take his meds. In the office, his BP was 76/44 on the R and 84/46 on the L. He took his metolazone yesterday, he takes it on Mon/Wed/Fri.  Per reports, pt HR dropped from 96 (resting), to 50 (with exertion) yesterday. The PT/OT person that told his daughter this is not availableTo discuss these data.   He has had lower jaw pain and neck pain when he chews on anything very solid (such as chicken). The pain will go away when he quits chewing.   He denies SOB at rest. He has chronic DOE that has not changed recently. He has had balance problems, walks with a walker. He has not fallen. He denies presyncope or syncope. His only complaint is weakness. This is worse when he tries to get up and move around. His family states his level of consciousness is slightly decreased, he is more confused than usual today. He has had no lower extremity edema recently, and denies PND. He has chronic orthopnea, but this is partly related to musculoskeletal issues such as neck stiffness and kyphosis.  He has had some black stools. No diarrhea. No other indication of blood loss.  INR at d/c was 1.13   Past Medical History:  Diagnosis Date  . Atrial fibrillation (HCC)   . CAD (coronary artery disease)   . CHF (congestive heart failure) (HCC)   . Diabetes mellitus   . DVT (deep venous thrombosis) (HCC)   . Fall at home 10/2015  . Hyperlipidemia   . Hypertension   . Left-sided carotid artery disease (HCC)   . Myocardial infarction   . NSTEMI (non-ST elevated myocardial infarction) (HCC)   . Peripheral vascular disease (HCC)   . Stroke (HCC)   . Venous insufficiency     Past Surgical History:  Procedure Laterality Date  . CORONARY ARTERY BYPASS GRAFT  1997   vein harvest from right leg  . CORONARY STENT INTERVENTION N/A 08/07/2016   Procedure: Coronary Stent Intervention;  Surgeon: Corky Crafts, MD;  Location: [facility] INVASIVE CV LAB;  Service: Cardiovascular;  Laterality: N/A;  RCA  . ESOPHAGOGASTRODUODENOSCOPY N/A 08/12/2016   Procedure: ESOPHAGOGASTRODUODENOSCOPY (EGD);  Surgeon: Carman Ching, MD;  Location: [facility] ENDOSCOPY;  Service: Endoscopy;  Laterality: N/A;  . INGUINAL HERNIA REPAIR  1980's   Bilateral,  done in [place]  . LEFT HEART CATH AND CORS/GRAFTS ANGIOGRAPHY N/A 08/07/2016   Procedure: Left Heart Cath and Cors/Grafts Angiography;  Surgeon: Corky Crafts, MD;  Location: [facility] INVASIVE CV LAB;  Service: Cardiovascular;  Laterality: N/A;  . PR VEIN BYPASS GRAFT,AORTO-FEM-POP  1980  FEM-FEM BPG by [name]    Medication Sig  . allopurinol (ZYLOPRIM) 100 MG tablet TAKE 2 TABLETS BY MOUTH DAILY. (Patient taking differently: TAKE 1 TABLET BY MOUTH TWICE DAILY)  . clopidogrel (PLAVIX) 75 MG tablet Take 1 tablet (75 mg total) by mouth daily with breakfast.  . enalapril (VASOTEC) 20 MG tablet Take 10 mg by mouth daily.   . ferrous sulfate 325 (65 FE) MG tablet Take 1 tablet (325 mg total) by mouth 2 (two) times daily with a meal.  . furosemide (LASIX) 40 MG tablet Take 1.5 tablets (60 mg total) by mouth 2 (two) times daily.  Marland Kitchen gabapentin  (NEURONTIN) 100 MG capsule Take 100 mg by mouth 3 (three) times daily.   Marland Kitchen glipiZIDE (GLUCOTROL) 5 MG tablet Take 5 mg by mouth 2 (two) times daily before a meal.  . hydrALAZINE (APRESOLINE) 100 MG tablet Take 100 mg by mouth 2 (two) times daily.  . insulin aspart protamine- aspart (NOVOLOG MIX 70/30) (70-30) 100 UNIT/ML injection Inject 100 Units into the skin 3 (three) times daily before meals.  . isosorbide mononitrate (IMDUR) 60 MG 24 hr tablet Take 1 tablet (60 mg total) by mouth daily. Appointment needed for future refills  . metolazone (ZAROXOLYN) 2.5 MG tablet Take 1 tablet (2.5 mg total) by mouth every Monday, Wednesday, and Friday.  . Multiple Vitamin (MULTIVITAMIN WITH MINERALS) TABS tablet Take 1 tablet by mouth daily.  . pantoprazole (PROTONIX) 40 MG tablet Take 40 mg by mouth 2 (two) times daily before a meal.   . pravastatin (PRAVACHOL) 40 MG tablet Take 40 mg by mouth at bedtime.   Marland Kitchen PRESCRIPTION MEDICATION Inhale into the lungs at bedtime. CPAP  . Psyllium (METAMUCIL PO) Take 2 capsules by mouth at bedtime.  Marland Kitchen QUEtiapine (SEROQUEL) 50 MG tablet Take 50 mg by mouth daily with supper.  . traMADol (ULTRAM) 50 MG tablet Take 50-100 mg by mouth 3 (three) times daily as needed (pain).   Marland Kitchen warfarin (COUMADIN) 4 MG tablet Take 4 mg by mouth daily with supper.   No current facility-administered medications for this visit.     Allergies:   Patient has no known allergies.    Social History:  The patient  reports that he quit smoking about 38 years ago. His smoking use included Cigarettes. He has never used smokeless tobacco. He reports that he does not drink alcohol or use drugs.   Family History:  The patient's family history includes Cancer in his father; Diabetes in his daughter, father, and sister; Heart attack in his brother and father; Heart disease in his father, mother, and sister; Hyperlipidemia in his brother, daughter, father, mother, and sister; Hypertension in his brother,  daughter, father, mother, sister, and son; Other in his brother, daughter, father, mother, and sister; Varicose Veins in his daughter, mother, and sister.    ROS:  Please see the history of present illness. All other systems are reviewed and negative.    PHYSICAL EXAM: VS:  BP (!) 76/44   Pulse 96   Ht  (1.702 m)   Wt 244 lb 3.2 oz (110.8 kg)   BMI 38.25 kg/m  , BMI Body mass index is 38.25 kg/m. GEN: Well nourished, well developed, male in no acute distress  HEENT: normal for age  Neck: no JVD, no carotid bruit, no masses Cardiac: RRR; soft murmur, no rubs, or gallops Respiratory:  clear to auscultation bilaterally, normal work of breathing GI: soft, nontender, nondistended, + BS MS: no  deformity or atrophy; no edema; distal pulses are 2+ in all 4 extremities   Skin: warm and dry, no rash Neuro:  Generalized weakness, +balance problems, sensation is intact Psych: euthymic mood, full affect  EKG:  EKG is ordered today. The ekg ordered today demonstrates sinus rhythm versus junctional, heart rate 97, P waves are very difficult to see and if it is sinus rhythm, there is a first-degree AV block. Previous ECGs in 2018 are read as accelerated junctional rhythm. However, his ECG from 09/06/2015 does not look much different from today and the P waves were very small at that time as well. He had a first-degree AV block at that time.  ECHO: 08/09/2016 - Left ventricle: The cavity size was normal. Wall thickness was   normal. Systolic function was normal. The estimated ejection   fraction was in the range of 55% to 65%. Wall motion was normal;   there were no regional wall motion abnormalities. - Mitral valve: There was mild regurgitation. - Right ventricle: Systolic function was mildly reduced. - Right atrium: The atrium was mildly dilated. - Pulmonary arteries: Systolic pressure was moderately increased.   PA peak pressure: 44 mm Hg (S).  CATH: 08/07/2016  Ost Cx to Prox Cx  lesion, 100 %stenosed. SVG to OM patent, with Y graft SVG to diagonal originating from mid portion of the OM graft. Moderate disease in the origin of the Y graft portion to the diagonal.  Mid LAD lesion, 70 %stenosed. Dist LAD lesion, 100 %stenosed. LIMA to LAD is widely patent.  Ost RCA to Prox RCA lesion, 80 %stenosed. A STENT XIENCE ALPINE RX [id number] drug eluting stent was successfully placed.  Post intervention, there is a 0% residual stenosis.  LV end diastolic pressure is moderately elevated.  There is no aortic valve stenosis.  Continue clopidogrel for at least a year.  Will likely be on a combination of Coumadin and clopidogrel going forward.  Continue aggressive secondary prevention. Post cath hydration limited by increased LVEDP.  Anemia also puts him at risk for CIN.  Follow renal function closely.  Hold enalapril for now.   Recent Labs: 08/10/2016: ALT 13; B Natriuretic Peptide 121.5 08/11/2016: TSH 2.504 08/12/2016: Magnesium 2.2 08/14/2016: BUN 19; Creatinine, Ser 1.56; Hemoglobin 8.9; Platelets 257; Potassium 4.0; Sodium 133    Lipid Panel    Component Value Date/Time   CHOL 79 08/07/2016 1054   TRIG 154 (H) 08/07/2016 1054   HDL 26 (L) 08/07/2016 1054   CHOLHDL 3.0 08/07/2016 1054   VLDL 31 08/07/2016 1054   LDLCALC 22 08/07/2016 1054   LDLDIRECT 43 01/28/2013 1127     Wt Readings from Last 3 Encounters:  08/21/16 244 lb 3.2 oz (110.8 kg)  08/14/16 241 lb 2.9 oz (109.4 kg)  08/08/16 234 lb 12.6 oz (106.5 kg)     Other studies Reviewed: Additional studies/ records that were reviewed today include: Office notes, hospital records and testing.  ASSESSMENT AND PLAN: The patient was discussed with Dr. Duke Salvia who agrees with the plan  1.  Chronic diastolic CHF: He has been on Lasix at 60 mg twice a day and has been taking metolazone 2.5 mg every Monday Wednesday and Friday. He had his usual dose yesterday.  Today, he has no signs or symptoms of volume overload by  exam and his orthostatic vital signs were positive. Systolic blood pressure dropped from 106 down to 81 coin from lying to standing. His heart rate stayed about the same. He is quite likely  in an accelerated junctional rhythm which would limit his ability to respond to changes in position. He drank 2 glasses of water while in the office, but was still extremely weak and shaky when he tried to stand up. Because of the combination of positive orthostatic symptoms and anemia, he is being sent to the emergency room for stat check on his labs and further assessment. The patient is here today with his sister who will transport him. I feel he is safe to go by car.  We will discontinue the metolazone. He is to hold his Lasix until tomorrow or until told to restarted by the ER staff. Continue daily weights and low sodium diet.  2. Anemia: He has had some black stools. He is not sure how many. He got IV iron in the hospital, and is on oral iron supplementation, so the significance of the black stools is unclear. He is on Coumadin. His INR was subtherapeutic week ago. He will be sent to the ER and this will be rechecked today.  3. CAD: He is not having any ischemic symptoms. He is on Plavix but no aspirin because of Coumadin. He is also on Plavix, isosorbide, and Pravachol. Continue these as his blood pressure will allow.  4. Orthostatic hypotension: His blood pressures on the low side of normal at baseline but when he is up and around, it drops significantly. Upon initial assessment in the office, his systolic blood pressure was 76. Once his volume status is normalized, determine if changes in his other medications need to be made.   Current medicines are reviewed at length with the patient today.  The patient has concerns regarding medicines.Concerns were addressed  The following changes have been made:  DC metolazone and hold Lasix for today or longer depending on patient condition  Labs/ tests ordered today  include: Labs will be done in the ER  No orders of the defined types were placed in this encounter.    Disposition:   FU with Dr. Allyson Sabal  Signed, Barrett, Bjorn Loser, PA-C  08/21/2016 10:10 AM    [facility] Phone: [phone]; Fax: [phone]  This note was written with the assistance of speech recognition software. Please excuse any transcriptional errors.

## 2016-08-21 NOTE — ED Notes (Signed)
Report attempted 

## 2016-08-21 NOTE — ED Triage Notes (Signed)
Patient sent from doctors office for dark stools noted on exam and ongoing weakness for past few days. Just admitted last week for same. Denies pain, alert and oriented

## 2016-08-21 NOTE — ED Notes (Signed)
Family went home states daughter will be coming later

## 2016-08-21 NOTE — H&P (Signed)
History and Physical    Isaiah [name] ZOX:[id number] DOB: [birth year] DOA: 08/21/2016  Referring MD/NP/PA: Dr. Ranae Palms   PCP: Burman Blacksmith., MD   Patient coming from: referred from cardiology office   Chief Complaint:   HPI: Isaiah [name] is a 76 y.o. male with known HTN, IDDM, CVA, D-CHF, DVT, PAF on coumadin, recent admit 03/30-04/07/2016 for acute on chronic diastolic CHF and anemia with FOBT positive, INR was subtherapeutic and patient was iron deficient. At that time pt was discharged home on oxygen. Cr 1.5 at discharge. Pt was seen at the cardiology office for follow up earlier today and  Reported he felt somewhat week and tired, has not had good appetite. Pt also reported he was taking medications as prescribed, denies any chest pain or dyspnea, no specific abd or urinary concerns. Pt reports noticing occasional black stools, no diarrhea. Pt reports feeling rather dizzy in the past several days, worse when he stands up. At the cardiologist office, BP was 76/44 on the R and 84/46 on the L. He took his metolazone yesterday, he takes it on Mon/Wed/Fri.  ED Course: Pt was alert and oriented x3, VS notable for initial BP 76/44, was given IVF and responded well, repeat BP 161/59. Pt also noted to be severely orthostatic, > 20 points difference in SBP from laying down to sitting and HR change over 20 points as well. TRH asked to admit for further evaluation.   Review of Systems:  Constitutional: Negative for fever, chills, diaphoresis HENT: Negative for ear pain, nosebleeds, congestion, facial swelling, rhinorrhea, neck pain, neck stiffness and ear discharge.   Eyes: Negative for pain, discharge, redness, itching and visual disturbance.  Respiratory: Negative for cough, choking, chest tightness, shortness of breath, wheezing and stridor.   Cardiovascular: Negative for chest pain, palpitations Gastrointestinal: Negative for abdominal distention.  Genitourinary: Negative for dysuria,  urgency, frequency, hematuria, flank pain, difficulty urinating and dyspareunia.  Musculoskeletal: Negative for back pain, joint swelling, arthralgias Neurological: Negative for tremors, seizures, syncope, facial asymmetry, speech difficulty Hematological: Negative for adenopathy. Does not bruise/bleed easily.  Psychiatric/Behavioral: Negative for hallucinations, behavioral problems, confusion, dysphoric mood, decreased concentration and agitation.   Past Medical History:  Diagnosis Date  . Atrial fibrillation (HCC)   . CAD (coronary artery disease)   . CHF (congestive heart failure) (HCC)   . Diabetes mellitus   . DVT (deep venous thrombosis) (HCC)   . Fall at home 10/2015  . Hyperlipidemia   . Hypertension   . Left-sided carotid artery disease (HCC)   . Myocardial infarction   . NSTEMI (non-ST elevated myocardial infarction) (HCC)   . Peripheral vascular disease (HCC)   . Stroke (HCC)   . Venous insufficiency     Past Surgical History:  Procedure Laterality Date  . CORONARY ARTERY BYPASS GRAFT  1997   vein harvest from right leg  . CORONARY STENT INTERVENTION N/A 08/07/2016   Procedure: Coronary Stent Intervention;  Surgeon: Corky Crafts, MD;  Location: [facility] INVASIVE CV LAB;  Service: Cardiovascular;  Laterality: N/A;  RCA  . ESOPHAGOGASTRODUODENOSCOPY N/A 08/12/2016   Procedure: ESOPHAGOGASTRODUODENOSCOPY (EGD);  Surgeon: Carman Ching, MD;  Location: [facility] ENDOSCOPY;  Service: Endoscopy;  Laterality: N/A;  . INGUINAL HERNIA REPAIR  1980's   Bilateral,  done in [place]  . LEFT HEART CATH AND CORS/GRAFTS ANGIOGRAPHY N/A 08/07/2016   Procedure: Left Heart Cath and Cors/Grafts Angiography;  Surgeon: Corky Crafts, MD;  Location: [facility] INVASIVE CV LAB;  Service: Cardiovascular;  Laterality:  N/A;  . PR VEIN BYPASS GRAFT,AORTO-FEM-POP  1980   FEM-FEM BPG by [name]   Social Hx:  reports that he quit smoking about 38 years ago. His smoking use included Cigarettes. He has  never used smokeless tobacco. He reports that he does not drink alcohol or use drugs.  No Known Allergies  Family History  Problem Relation Age of Onset  . Other Mother     varicose veins  . Heart disease Mother   . Hyperlipidemia Mother   . Hypertension Mother   . Varicose Veins Mother   . Cancer Father   . Diabetes Father   . Heart disease Father     before age 67  . Hyperlipidemia Father   . Hypertension Father   . Other Father     varicose veins  . Heart attack Father   . Diabetes Daughter   . Hyperlipidemia Daughter   . Hypertension Daughter   . Other Daughter     varicose veins  . Varicose Veins Daughter   . Hypertension Son   . Diabetes Sister   . Heart disease Sister     DVT  . Other Sister     varicose veins  . Hyperlipidemia Sister   . Hypertension Sister   . Varicose Veins Sister   . Other Brother     varicose veins  . Hyperlipidemia Brother   . Hypertension Brother   . Heart attack Brother     Prior to Admission medications   Medication Sig Start Date End Date Taking? Authorizing Provider  allopurinol (ZYLOPRIM) 100 MG tablet TAKE 2 TABLETS BY MOUTH DAILY. Patient taking differently: TAKE 1 TABLET BY MOUTH TWICE DAILY 05/03/14  Yes Runell Gess, MD  clopidogrel (PLAVIX) 75 MG tablet Take 1 tablet (75 mg total) by mouth daily with breakfast. 08/10/16  Yes Maretta Bees, MD  enalapril (VASOTEC) 20 MG tablet Take 10 mg by mouth daily.    Yes Historical Provider, MD  ferrous sulfate 325 (65 FE) MG tablet Take 1 tablet (325 mg total) by mouth 2 (two) times daily with a meal. 08/09/16  Yes Shanker Levora Dredge, MD  furosemide (LASIX) 40 MG tablet Take 1.5 tablets (60 mg total) by mouth 2 (two) times daily. 08/14/16  Yes Clydia Llano, MD  gabapentin (NEURONTIN) 100 MG capsule Take 100 mg by mouth 3 (three) times daily.  01/28/13  Yes Historical Provider, MD  glipiZIDE (GLUCOTROL) 5 MG tablet Take 5 mg by mouth 2 (two) times daily before a meal.   Yes  Historical Provider, MD  hydrALAZINE (APRESOLINE) 100 MG tablet Take 100 mg by mouth 2 (two) times daily.   Yes Historical Provider, MD  insulin aspart protamine- aspart (NOVOLOG MIX 70/30) (70-30) 100 UNIT/ML injection Inject 100 Units into the skin 3 (three) times daily before meals.   Yes Historical Provider, MD  isosorbide mononitrate (IMDUR) 60 MG 24 hr tablet Take 1 tablet (60 mg total) by mouth daily. Appointment needed for future refills 05/03/15  Yes Runell Gess, MD  Multiple Vitamin (MULTIVITAMIN WITH MINERALS) TABS tablet Take 1 tablet by mouth daily.   Yes Historical Provider, MD  pantoprazole (PROTONIX) 40 MG tablet Take 40 mg by mouth 2 (two) times daily before a meal.    Yes Historical Provider, MD  pravastatin (PRAVACHOL) 40 MG tablet Take 40 mg by mouth at bedtime.    Yes Historical Provider, MD  PRESCRIPTION MEDICATION Inhale into the lungs at bedtime. CPAP   Yes Historical  Provider, MD  Psyllium (METAMUCIL PO) Take 2 capsules by mouth at bedtime.   Yes Historical Provider, MD  QUEtiapine (SEROQUEL) 50 MG tablet Take 50 mg by mouth daily with supper.   Yes Historical Provider, MD  traMADol (ULTRAM) 50 MG tablet Take 50-100 mg by mouth 3 (three) times daily as needed (pain).  08/30/15  Yes Historical Provider, MD  warfarin (COUMADIN) 4 MG tablet Take 4 mg by mouth daily with supper.   Yes Historical Provider, MD    Physical Exam: Vitals:   08/21/16 1615 08/21/16 1630 08/21/16 1700 08/21/16 1715  BP: (!) 162/78 (!) 162/56 (!) 168/68 (!) 147/63  Pulse: 78 86 80 83  Resp: 14 (!) Temp:      TempSrc:      SpO2: 96% 94% 97% 97%    Constitutional: NAD, calm, comfortable Vitals:   08/21/16 1615 08/21/16 1630 08/21/16 1700 08/21/16 1715  BP: (!) 162/78 (!) 162/56 (!) 168/68 (!) 147/63  Pulse: 78 86 80 83  Resp: 14 (!) Temp:      TempSrc:      SpO2: 96% 94% 97% 97%   Eyes: PERRL, lids and conjunctivae normal ENMT: Mucous membranes are dry. Posterior  pharynx clear of any exudate or lesions.Normal dentition.  Neck: normal, supple, no masses, no thyromegaly Respiratory: No accessory muscle use. Diminished breath sounds at bases  Cardiovascular: Regular rate and rhythm, no rubs / gallops. +1 LE pitting edema. 2+ pedal pulses. No carotid bruits. SEM 2/6. Abdomen: no tenderness, no masses palpated. No hepatosplenomegaly. Bowel sounds positive.  Musculoskeletal: no clubbing / cyanosis. No joint deformity upper and lower extremities. Good ROM, no contractures. Normal muscle tone.  Skin: no rashes, lesions, ulcers. No induration Neurologic: CN 2-12 grossly intact. Sensation intact, DTR normal. Strength 5/5 in all 4.  Psychiatric: Normal judgment and insight. Alert and oriented x 3. Normal mood.   Labs on Admission: I have personally reviewed following labs and imaging studies  CBC:  Recent Labs Lab 08/21/16 1156  WBC 11.3*  HGB 10.3*  HCT 32.0*  MCV 90.4  PLT 310   Basic Metabolic Panel:  Recent Labs Lab 08/21/16 1156  NA 121*  K 4.5  CL 81*  CO2 25  GLUCOSE 261*  BUN 63*  CREATININE 3.09*  CALCIUM 9.2   Liver Function Tests:  Recent Labs Lab 08/21/16 1156  AST 28  ALT 17  ALKPHOS 71  BILITOT 0.6  PROT 8.2*  ALBUMIN 4.0   Coagulation Profile:  Recent Labs Lab 08/21/16 1156  INR 1.96   Urine analysis:    Component Value Date/Time   COLORURINE YELLOW 08/21/2016 1636   APPEARANCEUR CLEAR 08/21/2016 1636   LABSPEC 1.006 08/21/2016 1636   PHURINE 5.0 08/21/2016 1636   GLUCOSEU NEGATIVE 08/21/2016 1636   HGBUR NEGATIVE 08/21/2016 1636   BILIRUBINUR NEGATIVE 08/21/2016 1636   KETONESUR NEGATIVE 08/21/2016 1636   PROTEINUR NEGATIVE 08/21/2016 1636   NITRITE NEGATIVE 08/21/2016 1636   LEUKOCYTESUR NEGATIVE 08/21/2016 1636   Exams on Admission: Dg Chest Port 1 View  Result Date: 08/21/2016 CLINICAL DATA:  Dark stools, ongoing weakness, anemia, shortness of breath with exertion, history diabetes mellitus,  hypertension, atrial fibrillation, coronary artery disease post MI, CHF, prior stroke, sleep apnea EXAM: PORTABLE CHEST 1 VIEW COMPARISON:  Portable exam 1543 hours compared to 08/10/2016 FINDINGS: Rotated to the LEFT. Upper normal heart size post CABG. Atherosclerotic calcification aorta. Mediastinal contours and pulmonary vascularity normal. Bibasilar atelectasis without  gross infiltrate, pleural effusion, or pneumothorax. Bones demineralized. IMPRESSION: Post CABG with aortic atherosclerosis. Bibasilar atelectasis. Electronically Signed   By: Ulyses Southward M.D.   On: 08/21/2016 15:56   EKG: pending   Assessment/Plan  Weakness - appears to be multifactorial and secondary to dehydration, orthostatic hypotension, poor oral intake, hyponatremia  - agree with admission to telemetry unit - provide gentle hydration - hold antihypertensive regimen for now until BP stabilizes  - will ask for PT eval - repeat orthostatic vitals in AM   Hypotension - hold home regimen Lasix, Enalapril , Hydralazine - continue Imdur for now but if repeat BP low, hold Imdur as well  - resume one med at the time as BP allows   Hyponatremia - in the setting of dehydration and lasix use - hold lasix, gentle hydration as noted above - BMP In AM  Leukocytosis - possibly reactive, no clear evidence of an infectious etiology - CXR with no evidence of PNA, UA clear - hold off on ABX for now - CBC In AM   Chronic diastolic CHF - Recent cardiac cath showed elevated EDP, LVEF was 55-65% with PA peak pressure 44 mmHg, - On Lasix and metolazone at home - will hold lasix for now - monitor daily weight, strict I/O - consider cardiology consult in am for medical dosing adjustment   Normocytic anemia, occult GIB  - There is no gross bleeding, no abd pain, and no nausea. FOBT is positive, INR is 1.96 - Can NOT stop Plavix, stent placed on 08/07/2016. - EGD recently showed no active bleeding. No colonoscopy recommended  because of patient multiple comorbid conditions. - GI team on recent admission was okay with restarting Coumadin and Plavix - will continue for now and monitor - CBC In AM and if Hg drops, consider GI consult as well   Coronary artery disease  - No anginal complaints on arrival - Had DES placed to proximal RCA on 08/07/16.  - Continue Plavix and statin; beta-blocker discontinued during recent admission due to bradycardia  Chronic atrial fibrillation  - CHADS-VASc at least 6 (age +17, CVA +2, CAD, DM)  - will continue Coumadin and if Hg drops or active bleeding, will need to stop Coumadin and call GI   Insulin-dependent DM, uncontrolled with hyperglycemia and nephropathy  - A1c was 10.5% in 01/2013, no recent A1c. Recommend follow-up with PCP, blood sugar range was 180-230 in the hospital. - Managed at home with Novolog 70/30 100 units TID, plus glipizide  - will continue home regimen at the lower dose 30 U TID with SSI - stop Glipizide for now   Acute on CKD stage III  - SCr is 3.06 on admission, this is up from recent discharge Cr 1.6 - hold lasix and provide gentle hydration - BMP In AM  DVT prophylaxis: Coumadin  Code Status: Full  Family Communication: Pt and wife updated at bedside Disposition Plan: admit to telemetry unit, will eventually go home  Consults called: None Admission status: Inpatient   Debbora Presto MD [facility] Pager [phone]  If 7PM-7AM, please contact night-coverage [email or web address] Password [facility]  08/21/2016, 5:28 PM

## 2016-08-21 NOTE — ED Provider Notes (Signed)
[facility]-EMERGENCY DEPT Provider Note   CSN: [id number] Arrival date & time: 08/21/16  1144     History   Chief Complaint Chief Complaint  Patient presents with  . Blood In Stools    HPI HENCE Isaiah [name] is a 76 y.o. male.  HPI Patient was recently hospitalized for non-STEMI and had a stent placed. Since he's been discharged home his had increasing dyspnea with any exertion and lightheadedness with standing. He also reports increased general weakness and fatigue. Patient also states his stool has been dark in color. No gross blood. Denies fever or chills. Denies any chest pain. He was at his follow-up cardiology appointment today and noted to be hypotensive with systolic blood pressure in the 70's. Transfer to the emergency department. Patient states he has ongoing bilateral anterior neck pain. He states this is worse with chewing. Has an appointment to follow-up with vascular surgery this week. No masses, difficulty swallowing or breathing. Past Medical History:  Diagnosis Date  . Atrial fibrillation (HCC)   . CAD (coronary artery disease)   . CHF (congestive heart failure) (HCC)   . Diabetes mellitus   . DVT (deep venous thrombosis) (HCC)   . Fall at home 10/2015  . Hyperlipidemia   . Hypertension   . Left-sided carotid artery disease (HCC)   . Myocardial infarction   . NSTEMI (non-ST elevated myocardial infarction) (HCC)   . Peripheral vascular disease (HCC)   . Stroke (HCC)   . Venous insufficiency     Patient Active Problem List   Diagnosis Date Noted  . ARF (acute renal failure) (HCC) 08/21/2016  . Hypokalemia 08/11/2016  . Occult GI bleeding 08/11/2016  . General weakness 08/11/2016  . GI bleed 08/11/2016  . Weakness   . Non-ST elevation (NSTEMI) myocardial infarction (HCC) 08/08/2016  . Unstable angina (HCC) 08/06/2016  . Chest pain 08/05/2016  . CKD (chronic kidney disease), stage III 08/05/2016  . Normochromic normocytic anemia 08/05/2016  . Pain in joint, lower  leg 10/01/2014  . Coronary artery disease 04/27/2014  . Chronic atrial fibrillation (HCC) 04/27/2014  . Essential hypertension 04/27/2014  . Hyperlipidemia 04/27/2014  . Type 2 diabetes mellitus with vascular disease (HCC) 04/27/2014  . Obstructive sleep apnea 04/27/2014  . Stroke (HCC) 04/27/2014  . On continuous oral anticoagulation 04/27/2014  . Occlusion and stenosis of carotid artery without mention of cerebral infarction 02/04/2013  . Aftercare following surgery of the circulatory system, NEC 01/09/2012  . Chronic total occlusion of artery of the extremities (HCC) 01/09/2012    Past Surgical History:  Procedure Laterality Date  . CORONARY ARTERY BYPASS GRAFT  1997   vein harvest from right leg  . CORONARY STENT INTERVENTION N/A 08/07/2016   Procedure: Coronary Stent Intervention;  Surgeon: Corky Crafts, MD;  Location: [facility] INVASIVE CV LAB;  Service: Cardiovascular;  Laterality: N/A;  RCA  . ESOPHAGOGASTRODUODENOSCOPY N/A 08/12/2016   Procedure: ESOPHAGOGASTRODUODENOSCOPY (EGD);  Surgeon: Carman Ching, MD;  Location: [facility] ENDOSCOPY;  Service: Endoscopy;  Laterality: N/A;  . INGUINAL HERNIA REPAIR  1980's   Bilateral,  done in [place]  . LEFT HEART CATH AND CORS/GRAFTS ANGIOGRAPHY N/A 08/07/2016   Procedure: Left Heart Cath and Cors/Grafts Angiography;  Surgeon: Corky Crafts, MD;  Location: [facility] INVASIVE CV LAB;  Service: Cardiovascular;  Laterality: N/A;  . PR VEIN BYPASS GRAFT,AORTO-FEM-POP  1980   FEM-FEM BPG by [name]       Home Medications    Prior to Admission medications   Medication Sig  Start Date End Date Taking? Authorizing Provider  allopurinol (ZYLOPRIM) 100 MG tablet TAKE 2 TABLETS BY MOUTH DAILY. Patient taking differently: TAKE 1 TABLET BY MOUTH TWICE DAILY 05/03/14  Yes Runell Gess, MD  clopidogrel (PLAVIX) 75 MG tablet Take 1 tablet (75 mg total) by mouth daily with breakfast. 08/10/16  Yes Maretta Bees, MD  enalapril (VASOTEC) 20 MG  tablet Take 10 mg by mouth daily.    Yes Historical Provider, MD  ferrous sulfate 325 (65 FE) MG tablet Take 1 tablet (325 mg total) by mouth 2 (two) times daily with a meal. 08/09/16  Yes Shanker Levora Dredge, MD  furosemide (LASIX) 40 MG tablet Take 1.5 tablets (60 mg total) by mouth 2 (two) times daily. 08/14/16  Yes Clydia Llano, MD  gabapentin (NEURONTIN) 100 MG capsule Take 100 mg by mouth 3 (three) times daily.  01/28/13  Yes Historical Provider, MD  glipiZIDE (GLUCOTROL) 5 MG tablet Take 5 mg by mouth 2 (two) times daily before a meal.   Yes Historical Provider, MD  hydrALAZINE (APRESOLINE) 100 MG tablet Take 100 mg by mouth 2 (two) times daily.   Yes Historical Provider, MD  insulin aspart protamine- aspart (NOVOLOG MIX 70/30) (70-30) 100 UNIT/ML injection Inject 100 Units into the skin 3 (three) times daily before meals.   Yes Historical Provider, MD  isosorbide mononitrate (IMDUR) 60 MG 24 hr tablet Take 1 tablet (60 mg total) by mouth daily. Appointment needed for future refills 05/03/15  Yes Runell Gess, MD  Multiple Vitamin (MULTIVITAMIN WITH MINERALS) TABS tablet Take 1 tablet by mouth daily.   Yes Historical Provider, MD  pantoprazole (PROTONIX) 40 MG tablet Take 40 mg by mouth 2 (two) times daily before a meal.    Yes Historical Provider, MD  pravastatin (PRAVACHOL) 40 MG tablet Take 40 mg by mouth at bedtime.    Yes Historical Provider, MD  PRESCRIPTION MEDICATION Inhale into the lungs at bedtime. CPAP   Yes Historical Provider, MD  Psyllium (METAMUCIL PO) Take 2 capsules by mouth at bedtime.   Yes Historical Provider, MD  QUEtiapine (SEROQUEL) 50 MG tablet Take 50 mg by mouth daily with supper.   Yes Historical Provider, MD  traMADol (ULTRAM) 50 MG tablet Take 50-100 mg by mouth 3 (three) times daily as needed (pain).  08/30/15  Yes Historical Provider, MD  warfarin (COUMADIN) 4 MG tablet Take 4 mg by mouth daily with supper.   Yes Historical Provider, MD    Family History Family  History  Problem Relation Age of Onset  . Other Mother     varicose veins  . Heart disease Mother   . Hyperlipidemia Mother   . Hypertension Mother   . Varicose Veins Mother   . Cancer Father   . Diabetes Father   . Heart disease Father     before age 55  . Hyperlipidemia Father   . Hypertension Father   . Other Father     varicose veins  . Heart attack Father   . Diabetes Daughter   . Hyperlipidemia Daughter   . Hypertension Daughter   . Other Daughter     varicose veins  . Varicose Veins Daughter   . Hypertension Son   . Diabetes Sister   . Heart disease Sister     DVT  . Other Sister     varicose veins  . Hyperlipidemia Sister   . Hypertension Sister   . Varicose Veins Sister   . Other Brother  varicose veins  . Hyperlipidemia Brother   . Hypertension Brother   . Heart attack Brother     Social History Social History  Substance Use Topics  . Smoking status: Former Smoker    Types: Cigarettes    Quit date: 05/14/1978  . Smokeless tobacco: Never Used  . Alcohol use No     Allergies   Patient has no known allergies.   Review of Systems Review of Systems  Constitutional: Positive for fatigue. Negative for chills and fever.  HENT: Negative for sore throat, trouble swallowing and voice change.   Respiratory: Positive for shortness of breath. Negative for cough.   Cardiovascular: Negative for chest pain, palpitations and leg swelling.  Gastrointestinal: Negative for abdominal pain, diarrhea, nausea and vomiting.  Genitourinary: Negative for dysuria, flank pain and frequency.  Musculoskeletal: Positive for neck pain. Negative for back pain and myalgias.  Skin: Negative for rash and wound.  Neurological: Positive for weakness (generalized) and light-headedness. Negative for dizziness, syncope, numbness and headaches.  All other systems reviewed and are negative.    Physical Exam Updated Vital Signs BP (!) 150/94   Pulse 92   Temp 98.2 F (36.8 C)  (Oral)   Resp 11   SpO2 95%   Physical Exam  Constitutional: He is oriented to person, place, and time. He appears well-developed and well-nourished. No distress.  HENT:  Head: Normocephalic and atraumatic.  Mouth/Throat: Oropharynx is clear and moist. No oropharyngeal exudate.  Eyes: EOM are normal. Pupils are equal, round, and reactive to light.  Neck: Normal range of motion. Neck supple. No thyromegaly present.  Cardiovascular: Normal rate and regular rhythm.  Exam reveals no gallop and no friction rub.   No murmur heard. Pulmonary/Chest: Effort normal. No stridor. He has rales.  Few crackles in bilateral bases.  Abdominal: Soft. Bowel sounds are normal. There is no tenderness. There is no rebound and no guarding.  Genitourinary:  Genitourinary Comments: Small amount of brown stool in rectal vault.  Musculoskeletal: Normal range of motion. He exhibits no edema or tenderness.  No calf tenderness or asymmetry. Distal pulses intact.  Lymphadenopathy:    He has no cervical adenopathy.  Neurological: He is alert and oriented to person, place, and time.  Moving all extremities without focal deficit. Sensation fully intact.  Skin: Skin is warm and dry. Capillary refill takes less than 2 seconds. No rash noted. No erythema.  Psychiatric: He has a normal mood and affect. His behavior is normal.  Nursing note and vitals reviewed.    ED Treatments / Results  Labs (all labs ordered are listed, but only abnormal results are displayed) Labs Reviewed  COMPREHENSIVE METABOLIC PANEL - Abnormal; Notable for the following:       Result Value   Sodium 121 (*)    Chloride 81 (*)    Glucose, Bld 261 (*)    BUN 63 (*)    Creatinine, Ser 3.09 (*)    Total Protein 8.2 (*)    GFR calc non Af Amer 18 (*)    GFR calc Af Amer 21 (*)    All other components within normal limits  CBC - Abnormal; Notable for the following:    WBC 11.3 (*)    RBC 3.54 (*)    Hemoglobin 10.3 (*)    HCT 32.0 (*)     RDW 17.2 (*)    All other components within normal limits  PROTIME-INR - Abnormal; Notable for the following:    Prothrombin Time 22.7 (*)  All other components within normal limits  BRAIN NATRIURETIC PEPTIDE - Abnormal; Notable for the following:    B Natriuretic Peptide 120.7 (*)    All other components within normal limits  URINALYSIS, ROUTINE W REFLEX MICROSCOPIC  PROTIME-INR  POC OCCULT BLOOD, ED  I-STAT TROPOININ, ED  TYPE AND SCREEN    EKG  EKG Interpretation None      CRITICAL CARE Performed by: Ranae Palms, Luciano Cinquemani Total critical care time: 45 minutes Critical care time was exclusive of separately billable procedures and treating other patients. Critical care was necessary to treat or prevent imminent or life-threatening deterioration. Critical care was time spent personally by me on the following activities: development of treatment plan with patient and/or surrogate as well as nursing, discussions with consultants, evaluation of patient's response to treatment, examination of patient, obtaining history from patient or surrogate, ordering and performing treatments and interventions, ordering and review of laboratory studies, ordering and review of radiographic studies, pulse oximetry and re-evaluation of patient's condition. Radiology Dg Chest Port 1 View  Result Date: 08/21/2016 CLINICAL DATA:  Dark stools, ongoing weakness, anemia, shortness of breath with exertion, history diabetes mellitus, hypertension, atrial fibrillation, coronary artery disease post MI, CHF, prior stroke, sleep apnea EXAM: PORTABLE CHEST 1 VIEW COMPARISON:  Portable exam 1543 hours compared to 08/10/2016 FINDINGS: Rotated to the LEFT. Upper normal heart size post CABG. Atherosclerotic calcification aorta. Mediastinal contours and pulmonary vascularity normal. Bibasilar atelectasis without gross infiltrate, pleural effusion, or pneumothorax. Bones demineralized. IMPRESSION: Post CABG with aortic  atherosclerosis. Bibasilar atelectasis. Electronically Signed   By: Ulyses Southward M.D.   On: 08/21/2016 15:56    Procedures Procedures (including critical care time)  Medications Ordered in ED Medications  insulin aspart (novoLOG) injection 0-9 Units (not administered)  hydrALAZINE (APRESOLINE) injection 5 mg (not administered)  warfarin (COUMADIN) tablet 4 mg (not administered)  Warfarin - Pharmacist Dosing Inpatient (not administered)  sodium chloride 0.9 % bolus 500 mL (0 mLs Intravenous Stopped 08/21/16 1722)     Initial Impression / Assessment and Plan / ED Course  I have reviewed the triage vital signs and the nursing notes.  Pertinent labs & imaging results that were available during my care of the patient were reviewed by me and considered in my medical decision making (see chart for details).    Patient noted to be hyponatremic with worsening of renal function. Hypertension with standing. Given gentle fluid hydration and will admit for correction of electrolytes and further monitoring. No evidence of any active bleeding. Hemoccult was negative. Stable hemoglobin.   Final Clinical Impressions(s) / ED Diagnoses   Final diagnoses:  Orthostatic hypotension  Acute kidney injury (HCC)  Hyponatremia    New Prescriptions New Prescriptions   No medications on file     Loren Racer, MD 08/21/16 2107

## 2016-08-21 NOTE — Progress Notes (Signed)
Pt is alert orient x3 and very HOH, readmit x1 week with confusion and low Blood pressure pos orthostatics,and CR Daughter Lupita Leash at bedside briefly and update.plan to hold lasix  Transfuse NS at 75cc/hr and recheck CBC and BMP ortho in the am

## 2016-08-22 DIAGNOSIS — I482 Chronic atrial fibrillation: Secondary | ICD-10-CM

## 2016-08-22 DIAGNOSIS — E1159 Type 2 diabetes mellitus with other circulatory complications: Secondary | ICD-10-CM

## 2016-08-22 DIAGNOSIS — E871 Hypo-osmolality and hyponatremia: Secondary | ICD-10-CM

## 2016-08-22 DIAGNOSIS — N179 Acute kidney failure, unspecified: Principal | ICD-10-CM

## 2016-08-22 LAB — BASIC METABOLIC PANEL
Anion gap: 14 (ref 5–15)
BUN: 54 mg/dL — ABNORMAL HIGH (ref 6–20)
CO2: 25 mmol/L (ref 22–32)
Calcium: 9.3 mg/dL (ref 8.9–10.3)
Chloride: 86 mmol/L — ABNORMAL LOW (ref 101–111)
Creatinine, Ser: 2.18 mg/dL — ABNORMAL HIGH (ref 0.61–1.24)
GFR calc Af Amer: 32 mL/min — ABNORMAL LOW (ref >60)
GFR calc non Af Amer: 28 mL/min — ABNORMAL LOW (ref >60)
Glucose, Bld: 196 mg/dL — ABNORMAL HIGH (ref 65–99)
Potassium: 4 mmol/L (ref 3.5–5.1)
Sodium: 125 mmol/L — ABNORMAL LOW (ref 135–145)

## 2016-08-22 LAB — GLUCOSE, CAPILLARY
Glucose-Capillary: 177 mg/dL — ABNORMAL HIGH (ref 65–99)
Glucose-Capillary: 250 mg/dL — ABNORMAL HIGH (ref 65–99)
Glucose-Capillary: 217 mg/dL — ABNORMAL HIGH (ref 65–99)

## 2016-08-22 LAB — CBC
HCT: 32.8 % — ABNORMAL LOW (ref 39.0–52.0)
Hemoglobin: 10.4 g/dL — ABNORMAL LOW (ref 13.0–17.0)
MCH: 28.7 pg (ref 26.0–34.0)
MCHC: 31.7 g/dL (ref 30.0–36.0)
MCV: 90.4 fL (ref 78.0–100.0)
Platelets: 275 10*3/uL (ref 150–400)
RBC: 3.63 MIL/uL — ABNORMAL LOW (ref 4.22–5.81)
RDW: 17.2 % — ABNORMAL HIGH (ref 11.5–15.5)
WBC: 8.2 10*3/uL (ref 4.0–10.5)

## 2016-08-22 LAB — PROTIME-INR
INR: 1.88
Prothrombin Time: 21.8 seconds — ABNORMAL HIGH (ref 11.4–15.2)

## 2016-08-22 MED ORDER — WARFARIN SODIUM 5 MG PO TABS
5.0000 mg | ORAL_TABLET | Freq: Once | ORAL | Status: AC
  Administered 2016-08-22: 5 mg via ORAL
  Filled 2016-08-22: qty 1

## 2016-08-22 MED ORDER — SODIUM CHLORIDE 0.9 % IV SOLN
INTRAVENOUS | Status: AC
  Administered 2016-08-22 (×2): 1000 mL via INTRAVENOUS

## 2016-08-22 NOTE — Progress Notes (Addendum)
ANTICOAGULATION CONSULT NOTE  Pharmacy Consult for warfarin Indication: atrial fibrillation and history of DVT  No Known Allergies  Vital Signs: Temp: 98.3 F (36.8 C) (04/10 2134) Temp Source: Oral (04/10 2134) BP: 119/58 (04/11 0032) Pulse Rate: 87 (04/11 0032)  Labs:  Recent Labs  08/21/16 1156 08/22/16 0312  HGB 10.3* 10.4*  HCT 32.0* 32.8*  PLT 310 275  LABPROT 22.7* 21.8*  INR 1.96 1.88  CREATININE 3.09* 2.18*    Estimated Creatinine Clearance: 34.2 mL/min (A) (by C-G formula based on SCr of 2.18 mg/dL (H)).  Assessment: 76 yo male with history of afib. Pharmacy consulted to dose warfarin. INR this AM = 1.88, decreased, dose missed 4/10  HgB stable (? GI bleed)  Goal of Therapy:  INR 2-3 Monitor platelets by anticoagulation protocol: Yes   Plan:  Warfarin  po x 1 tonight Daily INR  Thank you Okey Regal, PharmD [phone]  08/22/2016,8:31 AM

## 2016-08-22 NOTE — Progress Notes (Signed)
Physical Therapy Treatment Patient Details Name: Isaiah [name] MRN: [id number] DOB: Dec 22, 1940 Today's Date: 08/22/2016    History of Present Illness Pt is a 76 y/o male admitted from his cardiologist's office secondary to low BP and dizziness. Pt found to be hyponatremic. PMH including but not limited to a-fib, CHF, CAD s/p CABG in 1997, DM, HTN, PVD, hx of MI and hx of CVA. Of note, pt with recent coronary stent on 08/07/16.    PT Comments    Pt presented sitting EOB when therapist arrived. Prior to admission, pt reported that he was mod I with functional mobility with use of RW for ambulation. Pt stated that he was independent with ADLs. Pt currently requires min guard for transfers and ambulation for safety. Pt on RA with SPO2 maintaining >90% throughout. Pt would continue to benefit from skilled physical therapy services at this time while admitted and after d/c to address the below listed limitations in order to improve overall safety and independence with functional mobility.    Follow Up Recommendations  Home health PT;Supervision/Assistance - 24 hour     Equipment Recommendations  None recommended by PT    Recommendations for Other Services       Precautions / Restrictions Precautions Precautions: Fall Restrictions Weight Bearing Restrictions: No    Mobility  Bed Mobility               General bed mobility comments: sitting EOB upon arrival  Transfers Overall transfer level: Needs assistance Equipment used: Rolling walker (2 wheeled) Transfers: Sit to/from Stand Sit to Stand: Min guard         General transfer comment: good technique, min guard for safety  Ambulation/Gait Ambulation/Gait assistance: Min guard Ambulation Distance (Feet): 20 Feet Assistive device: Rolling walker (2 wheeled) Gait Pattern/deviations: Step-through pattern;Trunk flexed Gait velocity: Decreased Gait velocity interpretation: Below normal speed for age/gender General Gait  Details: mild instability with ambulation, no LOB or need for physical assistance. pt ambulated on RA with SPO2 maintaining >90% throughout.   Stairs            Wheelchair Mobility    Modified Rankin (Stroke Patients Only)       Balance Overall balance assessment: Needs assistance Sitting-balance support: No upper extremity supported;Feet supported Sitting balance-Leahy Scale: Good     Standing balance support: During functional activity Standing balance-Leahy Scale: Fair                              Cognition Arousal/Alertness: Awake/alert Behavior During Therapy: WFL for tasks assessed/performed Overall Cognitive Status: Within Functional Limits for tasks assessed                                        Exercises      General Comments        Pertinent Vitals/Pain Pain Assessment: No/denies pain    Home Living Family/patient expects to be discharged to:: Private residence Living Arrangements: Children Available Help at Discharge: Family;Available 24 hours/day Type of Home: House Home Access: Level entry   Home Layout: Two level;Able to live on main level with bedroom/bathroom Home Equipment: Dan Humphreys - 2 wheels;Wheelchair - [employer] - 4 wheels;Bedside commode;Shower seat;Cane - single point      Prior Function Level of Independence: Independent with assistive device(s)    ADL's / Homemaking Assistance Needed: pt reported that  he ambulates with use of RW and is independent with ADLs.     PT Goals (current goals can now be found in the care plan section) Acute Rehab PT Goals Patient Stated Goal: to be able to go to the beach with his family in July PT Goal Formulation: With patient Time For Goal Achievement: 09/05/16 Potential to Achieve Goals: Good    Frequency    Min 3X/week      PT Plan      Co-evaluation             End of Session Equipment Utilized During Treatment: Gait belt Activity Tolerance:  Patient limited by fatigue Patient left: in bed;with call bell/phone within reach;Other (comment) (sitting EOB) Nurse Communication: Mobility status PT Visit Diagnosis: Other abnormalities of gait and mobility (R26.89);Muscle weakness (generalized) (M62.81)     Time: 1340-1401 PT Time Calculation (min) (ACUTE ONLY): 21 min  Charges:                       G Codes:       Isaiah [name], PT, DPT [phone]    Isaiah [name] 08/22/2016, 2:28 PM

## 2016-08-22 NOTE — Progress Notes (Signed)
[facility] PROGRESS NOTE  Isaiah [name] HYQ:[id number] DOB: 12-12-1940 DOA: 08/21/2016  PCP: Burman Blacksmith., MD  Brief History/Interval Summary: 76 y.o. male with known HTN, IDDM, CVA, D-CHF, DVT, PAF on coumadin, recent admit 03/30-04/07/2016 for acute on chronic diastolic CHF and anemia with FOBT positive, INR was subtherapeutic and patient was iron deficient. At that time pt was discharged home on oxygen. Cr 1.5 at discharge. Pt was seen at the cardiology office on the day of admission and reported feeling fatigued. He had low blood pressure. He was sent over to the emergency department where he was found to have severe hyponatremia and acute renal failure. He was hospitalized for further management.  Reason for Visit: Hyponatremia and acute renal failure  Consultants: None  Procedures: None  Antibiotics: None  Subjective/Interval History: Patient feels better today compared to yesterday. He denies any chest pain or shortness of breath.  ROS: Has not had any nausea, vomiting or diarrhea  Objective:  Vital Signs  Vitals:   08/21/16 2134 08/22/16 0032 08/22/16 0300 08/22/16 1126  BP:  (!) 119/58  (!) 128/58  Pulse: 92 87  88  Resp:  18  18  Temp: 98.3 F (36.8 C)   97.4 F (36.3 C)  TempSrc: Oral   Oral  SpO2: 97% 97%  98%  Weight: 108.8 kg (239 lb 14.4 oz)  107.7 kg (237 lb 6.4 oz)   Height:  (1.702 m)       Intake/Output Summary (Last 24 hours) at 08/22/16 1151 Last data filed at 08/22/16 0900  Gross per 24 hour  Intake              740 ml  Output             2850 ml  Net            -2110 ml   Filed Weights   08/21/16 2134 08/22/16 0300  Weight: 108.8 kg (239 lb 14.4 oz) 107.7 kg (237 lb 6.4 oz)    General appearance: alert, cooperative, appears stated age and no distress Resp: Diminished air entry at the bases. No wheezing, rales or rhonchi. No definite crackles. Cardio: regular rate and rhythm, S1, S2 normal, no murmur, click, rub or  gallop GI: soft, non-tender; bowel sounds normal; no masses,  no organomegaly Extremities: extremities normal, atraumatic, no cyanosis or edema Neurologic: No focal deficits  Lab Results:  Data Reviewed: I have personally reviewed following labs and imaging studies  CBC:  Recent Labs Lab 08/21/16 1156 08/22/16 0312  WBC 11.3* 8.2  HGB 10.3* 10.4*  HCT 32.0* 32.8*  MCV 90.4 90.4  PLT 310 275    Basic Metabolic Panel:  Recent Labs Lab 08/21/16 1156 08/22/16 0312  NA 121* 125*  K 4.5 4.0  CL 81* 86*  CO2 25 25  GLUCOSE 261* 196*  BUN 63* 54*  CREATININE 3.09* 2.18*  CALCIUM 9.2 9.3    GFR: Estimated Creatinine Clearance: 34.2 mL/min (A) (by C-G formula based on SCr of 2.18 mg/dL (H)).  Liver Function Tests:  Recent Labs Lab 08/21/16 1156  AST 28  ALT 17  ALKPHOS 71  BILITOT 0.6  PROT 8.2*  ALBUMIN 4.0    Coagulation Profile:  Recent Labs Lab 08/21/16 1156 08/22/16 0312  INR 1.96 1.88    CBG:  Recent Labs Lab 08/21/16 2149 08/22/16 0727 08/22/16 1126  GLUCAP 170* 177* 250*    Radiology Studies: Dg Chest Port 1 View  Result Date: 08/21/2016  CLINICAL DATA:  Dark stools, ongoing weakness, anemia, shortness of breath with exertion, history diabetes mellitus, hypertension, atrial fibrillation, coronary artery disease post MI, CHF, prior stroke, sleep apnea EXAM: PORTABLE CHEST 1 VIEW COMPARISON:  Portable exam 1543 hours compared to 08/10/2016 FINDINGS: Rotated to the LEFT. Upper normal heart size post CABG. Atherosclerotic calcification aorta. Mediastinal contours and pulmonary vascularity normal. Bibasilar atelectasis without gross infiltrate, pleural effusion, or pneumothorax. Bones demineralized. IMPRESSION: Post CABG with aortic atherosclerosis. Bibasilar atelectasis. Electronically Signed   By: Ulyses Southward M.D.   On: 08/21/2016 15:56     Medications:  Scheduled: . allopurinol  100 mg Oral BID  . clopidogrel  75 mg Oral Daily  . ferrous  sulfate  325 mg Oral BID WC  . gabapentin  100 mg Oral TID  . insulin aspart  0-9 Units Subcutaneous TID WC  . insulin aspart protamine- aspart  30 Units Subcutaneous TID WC  . isosorbide mononitrate  60 mg Oral Daily  . pantoprazole  40 mg Oral BID AC  . pravastatin  40 mg Oral QHS  . QUEtiapine  50 mg Oral Q supper  . sodium chloride flush  3 mL Intravenous Q12H  . warfarin  5 mg Oral ONCE-1800  . Warfarin - Pharmacist Dosing Inpatient   Does not apply q1800   Continuous: . sodium chloride 1,000 mL (08/22/16 0912)   ZOX:[id number], ondansetron **OR** ondansetron (ZOFRAN) IV, traMADol  Assessment/Plan:  Active Problems:   Chronic atrial fibrillation (HCC)   Essential hypertension   Type 2 diabetes mellitus with vascular disease (HCC)   Obstructive sleep apnea   CKD (chronic kidney disease), stage III   ARF (acute renal failure) (HCC)   Hyponatremia     Hypotension Patient denies any GI symptoms. No reason for him to be hypovolemic, except the fact that he is on diuretics at home along with antihypertensives. Medications the most likely reason for his hypotension. Blood pressures appear to have stabilized. Continue to hold his antihypertensives for now.  Hyponatremia Most likely due to diuretics. The dose of his Lasix was increased during his last hospitalization and he was discharged on the higher dose. Lasix has been held for now. He has been given normal saline infusion. Sodium level is improving slowly.  Leukocytosis Most likely reactive. Normal today.   Chronic diastolic CHF Recent cardiac cath showed elevated EDP, LVEF was 55-65% with PA peak pressure 44 mmHg, On Lasix and metolazone at home. Holding his diuretics for now due to hypotension and hyponatremia. Will likely need to be started at a lower dose at discharge.  Normocytic anemia, occult GIB  There is no gross bleeding, no abd pain, and no nausea. FOBT is positive, INR is 1.96. Can NOTstop Plavix, stent  placed on 08/07/2016. EGD recently showed no active bleeding. No colonoscopy recommended because of patient multiple comorbid conditions. GI team on recent admission was okay with restarting Coumadin and Plavix. Hemoglobin appears to be stable. Patient is on iron supplements at home, which could account for the dark stools that he has been experiencing. No overt GI bleed, noted for now.  History of Coronary artery disease  Seems to be stable. Had DES placed to proximal RCA on 08/07/16.Continue Plavix and statin; beta-blocker discontinued during recent admission due to bradycardia.  Chronic atrial fibrillation  CHADS-VASc at least 6 (age +23, CVA +46, CAD, DM). Will continue Coumadin and if Hg drops or active bleeding, will need to stop Coumadin and call GI.  Insulin-dependent DM, uncontrolled with hyperglycemiaand  nephropathy  A1c was 10.5% in 01/2013, no recent A1c. Recommend follow-up with PCP, blood sugar range was 180-230 in the hospital. Managed at home with Novolog 70/30 100 units TID, plus glipizide. Will continue home regimen at the lower dose 30 U TID with SSI. Stop Glipizide for now.  Acute on CKD stage III  Serum creatinine was 3.09 and admission. Baseline creatinine appears to be around 1.5-1.6. Rise in creatinine and BUN is most likely due to diuretics. Creatinine improved today. Monitor urine output. The need to hold diuretics as well as ACE inhibitor. Avoid nephrotoxic agents.  Essential hypertension Presented with hypotension as noted earlier. Most of the antihypertensives are on hold.  DVT Prophylaxis: Warfarin    Code Status: Full code  Family Communication: Discussed with the patient.  Disposition Plan: Management as outlined above. PT/OT to see the patient.    LOS: 1 day   [facility]  [facility] Pager [phone] 08/22/2016, 11:51 AM  If 7PM-7AM, please contact night-coverage at [email or web address], password [facility]

## 2016-08-22 NOTE — Discharge Instructions (Addendum)
Acute Kidney Injury, Adult Acute kidney injury is a sudden worsening of kidney function. The kidneys are organs that have several jobs. They filter the blood to remove waste products and extra fluid. They also maintain a healthy balance of minerals and hormones in the body, which helps control blood pressure and keep bones strong. With this condition, your kidneys do not do their jobs as well as they should. This condition ranges from mild to severe. Over time it may develop into long-lasting (chronic) kidney disease. Early detection and treatment may prevent acute kidney injury from developing into a chronic condition. What are the causes? Common causes of this condition include:  A problem with blood flow to the kidneys. This may be caused by:  Low blood pressure (hypotension) or shock.  Blood loss.  Heart and blood vessel (cardiovascular) disease.  Severe burns.  Liver disease.  Direct damage to the kidneys. This may be caused by:  Certain medicines.  A kidney infection.  Poisoning.  Being around or in contact with toxic substances.  A surgical wound.  A hard, direct hit to the kidney area.  A sudden blockage of urine flow. This may be caused by:  Cancer.  Kidney stones.  An enlarged prostate in males. What are the signs or symptoms? Symptoms of this condition may not be obvious until the condition becomes severe. Symptoms of this condition can include:  Tiredness (lethargy), or difficulty staying awake.  Nausea or vomiting.  Swelling (edema) of the face, legs, ankles, or feet.  Problems with urination, such as:  Abdominal pain, or pain along the side of your stomach (flank).  Decreased urine production.  Decrease in the force of urine flow.  Muscle twitches and cramps, especially in the legs.  Confusion or trouble concentrating.  Loss of appetite.  Fever. How is this diagnosed? This condition may be diagnosed with tests, including:  Blood  tests.  Urine tests.  Imaging tests.  A test in which a sample of tissue is removed from the kidneys to be examined under a microscope (kidney biopsy). How is this treated? Treatment for this condition depends on the cause and how severe the condition is. In mild cases, treatment may not be needed. The kidneys may heal on their own. In more severe cases, treatment will involve:  Treating the cause of the kidney injury. This may involve changing any medicines you are taking or adjusting your dosage.  Fluids. You may need specialized IV fluids to balance your body's needs.  Having a catheter placed to drain urine and prevent blockages.  Preventing problems from occurring. This may mean avoiding certain medicines or procedures that can cause further injury to the kidneys. In some cases treatment may also require:  A procedure to remove toxic wastes from the body (dialysis or continuous renal replacement therapy - CRRT).  Surgery. This may be done to repair a torn kidney, or to remove the blockage from the urinary system. Follow these instructions at home: Medicines   Take over-the-counter and prescription medicines only as told by your health care provider.  Do not take any new medicines without your health care provider's approval. Many medicines can worsen your kidney damage.  Do not take any vitamin and mineral supplements without your health care provider's approval. Many nutritional supplements can worsen your kidney damage. Lifestyle   If your health care provider prescribed changes to your diet, follow them. You may need to decrease the amount of protein you eat.  Achieve and maintain a  healthy weight. If you need help with this, ask your health care provider.  Start or continue an exercise plan. Try to exercise at least 30 minutes a day, 5 days a week.  Do not use any tobacco products, such as cigarettes, chewing tobacco, and e-cigarettes. If you need help quitting, ask  your health care provider. General instructions   Keep track of your blood pressure. Report changes in your blood pressure as told by your health care provider.  Stay up to date with immunizations. Ask your health care provider which immunizations you need.  Keep all follow-up visits as told by your health care provider. This is important. Where to find more information:  American Association of Kidney Patients: ResidentialShow.is  [employer]: www.kidney.org  [employer]: FightingMatch.com.ee  [employer] [facility]:  www.lifeoptions.org  www.kidneyschool.org Contact a health care provider if:  Your symptoms get worse.  You develop new symptoms. Get help right away if:  You develop symptoms of worsening kidney disease, which include:  Headaches.  Abnormally dark or light skin.  Easy bruising.  Frequent hiccups.  Chest pain.  Shortness of breath.  End of menstruation in women.  Seizures.  Confusion or altered mental status.  Abdominal or back pain.  Itchiness.  You have a fever.  Your body is producing less urine.  You have pain or bleeding when you urinate. Summary  Acute kidney injury is a sudden worsening of kidney function.  Acute kidney injury can be caused by problems with blood flow to the kidneys, direct damage to the kidneys, and sudden blockage of urine flow.  Symptoms of this condition may not be obvious until it becomes severe. Symptoms may include edema, lethargy, confusion, nausea or vomiting, and problems passing urine.  This condition can usually be diagnosed with blood tests, urine tests, and imaging tests. Sometimes a kidney biopsy is done to diagnose this condition.  Treatment for this condition often involves treating the underlying cause. It is treated with fluids, medicines, dialysis, diet changes, or surgery. This information is not intended to replace advice given to you by your health care provider.  Make sure you discuss any questions you have with your health care provider. Document Released: 11/13/2010 Document Revised: 04/20/2016 Document Reviewed: 04/20/2016 Elsevier Interactive Patient Education  2017 [employer].   Information on my medicine - Coumadin   (Warfarin)  Why was Coumadin prescribed for you? Coumadin was prescribed for you because you have a blood clot or a medical condition that can cause an increased risk of forming blood clots. Blood clots can cause serious health problems by blocking the flow of blood to the heart, lung, or brain. Coumadin can prevent harmful blood clots from forming. As a reminder your indication for Coumadin is:   Stroke Prevention Because Of Atrial Fibrillation  What test will check on my response to Coumadin? While on Coumadin (warfarin) you will need to have an INR test regularly to ensure that your dose is keeping you in the desired range. The INR (international normalized ratio) number is calculated from the result of the laboratory test called prothrombin time (PT).  If an INR APPOINTMENT HAS NOT ALREADY BEEN MADE FOR YOU please schedule an appointment to have this lab work done by your health care provider within 7 days. Your INR goal is usually a number between:  2 to 3 or your provider may give you a more narrow range like 2-2.5.  Ask your health care provider during an office visit what  your goal INR is.  What  do you need to  know  About  COUMADIN? Take Coumadin (warfarin) exactly as prescribed by your healthcare provider about the same time each day.  DO NOT stop taking without talking to the doctor who prescribed the medication.  Stopping without other blood clot prevention medication to take the place of Coumadin may increase your risk of developing a new clot or stroke.  Get refills before you run out.  What do you do if you miss a dose? If you miss a dose, take it as soon as you remember on the same day then continue your regularly  scheduled regimen the next day.  Do not take two doses of Coumadin at the same time.  Important Safety Information A possible side effect of Coumadin (Warfarin) is an increased risk of bleeding. You should call your healthcare provider right away if you experience any of the following: ? Bleeding from an injury or your nose that does not stop. ? Unusual colored urine (red or dark brown) or unusual colored stools (red or black). ? Unusual bruising for unknown reasons. ? A serious fall or if you hit your head (even if there is no bleeding).  Some foods or medicines interact with Coumadin (warfarin) and might alter your response to warfarin. To help avoid this: ? Eat a balanced diet, maintaining a consistent amount of Vitamin K. ? Notify your provider about major diet changes you plan to make. ? Avoid alcohol or limit your intake to 1 drink for women and 2 drinks for men per day. (1 drink is 5 oz. wine, 12 oz. beer, or 1.5 oz. liquor.)  Make sure that ANY health care provider who prescribes medication for you knows that you are taking Coumadin (warfarin).  Also make sure the healthcare provider who is monitoring your Coumadin knows when you have started a new medication including herbals and non-prescription products.  Coumadin (Warfarin)  Major Drug Interactions  Increased Warfarin Effect Decreased Warfarin Effect  Alcohol (large quantities) Antibiotics (esp. Septra/Bactrim, Flagyl, Cipro) Amiodarone (Cordarone) Aspirin (ASA) Cimetidine (Tagamet) Megestrol (Megace) NSAIDs (ibuprofen, naproxen, etc.) Piroxicam (Feldene) Propafenone (Rythmol SR) Propranolol (Inderal) Isoniazid (INH) Posaconazole (Noxafil) Barbiturates (Phenobarbital) Carbamazepine (Tegretol) Chlordiazepoxide (Librium) Cholestyramine (Questran) Griseofulvin Oral Contraceptives Rifampin Sucralfate (Carafate) Vitamin K   Coumadin (Warfarin) Major Herbal Interactions  Increased Warfarin Effect Decreased Warfarin  Effect  Garlic Ginseng Ginkgo biloba Coenzyme Q10 Green tea St. Johns wort    Coumadin (Warfarin) FOOD Interactions  Eat a consistent number of servings per week of foods HIGH in Vitamin K (1 serving =  cup)  Collards (cooked, or boiled & drained) Kale (cooked, or boiled & drained) Mustard greens (cooked, or boiled & drained) Parsley *serving size only =  cup Spinach (cooked, or boiled & drained) Swiss chard (cooked, or boiled & drained) Turnip greens (cooked, or boiled & drained)  Eat a consistent number of servings per week of foods MEDIUM-HIGH in Vitamin K (1 serving = 1 cup)  Asparagus (cooked, or boiled & drained) Broccoli (cooked, boiled & drained, or raw & chopped) Brussel sprouts (cooked, or boiled & drained) *serving size only =  cup Lettuce, raw (green leaf, endive, romaine) Spinach, raw Turnip greens, raw & chopped   These websites have more information on Coumadin (warfarin):  http://www.king-russell.com/; https://www.hines.net/;

## 2016-08-23 LAB — GLUCOSE, CAPILLARY
Glucose-Capillary: 217 mg/dL — ABNORMAL HIGH (ref 65–99)
Glucose-Capillary: 240 mg/dL — ABNORMAL HIGH (ref 65–99)
Glucose-Capillary: 345 mg/dL — ABNORMAL HIGH (ref 65–99)

## 2016-08-23 LAB — BASIC METABOLIC PANEL
Anion gap: 9 (ref 5–15)
BUN: 43 mg/dL — ABNORMAL HIGH (ref 6–20)
CO2: 27 mmol/L (ref 22–32)
Calcium: 9.6 mg/dL (ref 8.9–10.3)
Chloride: 95 mmol/L — ABNORMAL LOW (ref 101–111)
Creatinine, Ser: 1.78 mg/dL — ABNORMAL HIGH (ref 0.61–1.24)
GFR calc Af Amer: 41 mL/min — ABNORMAL LOW (ref >60)
GFR calc non Af Amer: 36 mL/min — ABNORMAL LOW (ref >60)
Glucose, Bld: 196 mg/dL — ABNORMAL HIGH (ref 65–99)
Potassium: 4.4 mmol/L (ref 3.5–5.1)
Sodium: 131 mmol/L — ABNORMAL LOW (ref 135–145)

## 2016-08-23 LAB — CBC
HCT: 32.9 % — ABNORMAL LOW (ref 39.0–52.0)
Hemoglobin: 10.1 g/dL — ABNORMAL LOW (ref 13.0–17.0)
MCH: 28.2 pg (ref 26.0–34.0)
MCHC: 30.7 g/dL (ref 30.0–36.0)
MCV: 91.9 fL (ref 78.0–100.0)
Platelets: 279 10*3/uL (ref 150–400)
RBC: 3.58 MIL/uL — ABNORMAL LOW (ref 4.22–5.81)
RDW: 17.3 % — ABNORMAL HIGH (ref 11.5–15.5)
WBC: 7.2 10*3/uL (ref 4.0–10.5)

## 2016-08-23 LAB — PROTIME-INR
INR: 1.67
Prothrombin Time: 19.9 seconds — ABNORMAL HIGH (ref 11.4–15.2)

## 2016-08-23 MED ORDER — FUROSEMIDE 40 MG PO TABS: 40.0000 mg | ORAL_TABLET | Freq: Two times a day (BID) | ORAL | 0 refills | Status: DC

## 2016-08-23 MED ORDER — WARFARIN SODIUM 3 MG PO TABS: 6.0000 mg | ORAL_TABLET | Freq: Once | ORAL | Status: DC

## 2016-08-23 NOTE — Care Management Note (Signed)
Case Management Note  Patient Details  Name: Isaiah [name] MRN: [id number] Date of Birth: [birth year]  Subjective/Objective:      Admitted with Acute Renal Failure             Action/Plan: CM following for DCP; Orders sent to [facility] for continuation of services; B Ave Filter RN  08/14/2016-PTA pt lived at home with daughter had her husband- spoke with pt at bedside- per pt he has all needed DME- and was active with [facility] services that were just set up on his last recent discharge- per pt he would like to continue those services and per PT/OT current recommendations for [facility]- pt was set up with [facility]- and per his choice wants to continue with them. Notified MD for new orders- call made to [facility]- confirmed they had referral- and new orders faxed to [phone] for HHPT/OT via epic- Rich Brave RN CM  Expected Discharge Date:  08/23/16               Expected Discharge Plan:  Home w Home Health Services  Discharge planning Services  CM Consult     HH Arranged:  PT, OT [facility] Agency:  [facility]  Status of Service:  In process, will continue to follow  Reola Mosher [phone] 08/23/2016, 11:16 AM

## 2016-08-23 NOTE — Progress Notes (Signed)
All d/c instructions explained given to pt and his daughter Isaiah [name].  Verbalized understanding.  D/c off floor at 1428 to awaiting transport.  [name], Charity fundraiser.

## 2016-08-23 NOTE — Discharge Summary (Signed)
[facility]  Physician Discharge Summary   Patient ID: Isaiah [name] MRN: [id number] DOB/AGE: 76-30-1942 76 y.o.  Admit date: 08/21/2016 Discharge date: 08/23/2016  PCP: [facility] T., MD  DISCHARGE DIAGNOSES:  Active Problems:   Chronic atrial fibrillation (HCC)   Essential hypertension   Type 2 diabetes mellitus with vascular disease (HCC)   Obstructive sleep apnea   CKD (chronic kidney disease), stage III   ARF (acute renal failure) (HCC)   Hyponatremia   RECOMMENDATIONS FOR OUTPATIENT FOLLOW UP: 1. Cardiology to schedule appointment for the patient early next week for blood work 2. Patient asked to discontinue metolazone for now 3. Lasix dose has been reduced. ACE inhibitor has been held for now.  DISCHARGE CONDITION: fair  Diet recommendation: As before  [facility] Weights   08/21/16 2134 08/22/16 0300 08/23/16 0608  Weight: 108.8 kg (239 lb 14.4 oz) 107.7 kg (237 lb 6.4 oz) 100.8 kg (222 lb 4.8 oz)    INITIAL HISTORY: 76 y.o.malewith known HTN, IDDM, CVA, D-CHF, DVT, PAF on coumadin, recent admit 03/30-04/07/2016 for acute on chronic diastolic CHF and anemia with FOBT positive, INR was subtherapeutic and patient was iron deficient. At that time pt was discharged home on oxygen. Cr 1.5 at discharge. Pt was seen at the cardiology office on the day of admission and reported feeling fatigued. He had low blood pressure. He was sent over to the emergency department where he was found to have severe hyponatremia and acute renal failure. He was hospitalized for further management.   HOSPITAL COURSE:   Hypotension in the setting of known essential hypertension Patient denied any GI symptoms. No reason for him to be hypovolemic, except the fact that he is on diuretics at home along with antihypertensives. Medications are the most likely reason for his hypotension. Blood pressures have stabilized. ACE inhibitor to be held. Lasix dose has been reduced. He will continue  his other home medications.   Hyponatremia Most likely due to diuretics. The dose of his Lasix was increased during his last hospitalization and he was discharged on the higher dose. He was also discharged on metolazone. Lasix was held. He was given normal saline infusion. Sodium has improved to 131. Will be discharged on lower dose of Lasix. Blood to be done next week at cardiology office.   Acute on CKD stage III  Serum creatinine was 3.09 and admission. Baseline creatinine appears to be around 1.5-1.6. Rise in creatinine and BUN is most likely due to diuretics. Patient was gently hydrated. His ACE inhibitor and diuretics were held. Renal function is almost back to baseline.   Chronic diastolic CHF Recent cardiac cath showed elevated EDP, LVEF was 55-65% with PA peak pressure 44 mmHg. Was on Lasix and metolazone at home. Discussed with the cardiology PA. Patient will be discharged on lower dose of Lasix. Metolazone will be held as will be the ACE inhibitor. Cardiology will follow patient in the office next week for blood work. He is currently euvolemic.  Normocytic anemia, occult GIB  There is no gross bleeding, no abd pain, and no nausea. FOBT is positive, INR is 1.96. Can NOTstop Plavix, stent placed on 08/07/2016. EGD recently showed no active bleeding. No colonoscopy recommended because of patient multiple comorbid conditions. GI team on recent admission wasokay with restarting Coumadin and Plavix. Hemoglobin appears to be stable. Patient is on iron supplements at home, which could account for the dark stools that he has been experiencing. No overt GI bleed, noted for now.  History  of Coronary artery disease  Seems to be stable. Had DES placed to proximal RCA on 08/07/16.Continue Plavix and statin; beta-blocker discontinued during recent admission due tobradycardia.  Chronic atrial fibrillation  CHADS-VASc at least 6 (age +59, CVA +20, CAD, DM). Continue warfarin. Rate is well  controlled.  Insulin-dependent DM, uncontrolled with hyperglycemiaand nephropathy  Continue home medication regimen  Overall, stable. Patient feels much better. His ambulatory with physical therapy. Denies any shortness of breath. Denies any dizziness or lightheadedness. Okay for discharge home today. Home health to be ordered.  PERTINENT LABS:  The results of significant diagnostics from this hospitalization (including imaging, microbiology, ancillary and laboratory) are listed below for reference.     Labs: Basic Metabolic Panel:  Recent Labs Lab 08/21/16 1156 08/22/16 0312 08/23/16 0257  NA 121* 125* 131*  K 4.5 4.0 4.4  CL 81* 86* 95*  CO2 25 25 27   GLUCOSE 261* 196* 196*  BUN 63* 54* 43*  CREATININE 3.09* 2.18* 1.78*  CALCIUM 9.2 9.3 9.6   Liver Function Tests:  Recent Labs Lab 08/21/16 1156  AST 28  ALT 17  ALKPHOS 71  BILITOT 0.6  PROT 8.2*  ALBUMIN 4.0   CBC:  Recent Labs Lab 08/21/16 1156 08/22/16 0312 08/23/16 0257  WBC 11.3* 8.2 7.2  HGB 10.3* 10.4* 10.1*  HCT 32.0* 32.8* 32.9*  MCV 90.4 90.4 91.9  PLT 310 275 279   BNP: BNP (last 3 results)  Recent Labs  08/05/16 1925 08/10/16 2323 08/21/16 1156  BNP 222.6* 121.5* 120.7*    CBG:  Recent Labs Lab 08/22/16 1126 08/22/16 1613 08/22/16 2135 08/23/16 0816 08/23/16 1218  GLUCAP 250* 217* 217* 240* 345*     IMAGING STUDIES Dg Chest 2 View  Result Date: 08/05/2016 CLINICAL DATA:  Sudden onset of mid sternal chest pain starting at 15:30 p.m. EXAM: CHEST  2 VIEW COMPARISON:  05/03/2013 FINDINGS: Cardiomediastinal silhouette is stable. Status post CABG. No infiltrate or pulmonary edema. Osteopenia and mild degenerative changes thoracic spine. IMPRESSION: No active cardiopulmonary disease. Status post CABG. Osteopenia and mild degenerative changes thoracic spine. Electronically Signed   By: Natasha Mead M.D.   On: 08/05/2016 19:04   Ct Head Wo Contrast  Result Date:  08/11/2016 CLINICAL DATA:  Weakness. EXAM: CT HEAD WITHOUT CONTRAST TECHNIQUE: Contiguous axial images were obtained from the base of the skull through the vertex without intravenous contrast. COMPARISON:  Head CT 4 days prior 08/08/2015 FINDINGS: Brain: Stable atrophy and chronic small vessel ischemia. Remote lacunar infarct in left basal ganglia unchanged. No CT findings of acute ischemia. No hemorrhage, mass effect or midline shift. No hydrocephalus. Incidental note of dural calcifications. Vascular: Atherosclerosis of skullbase vasculature without hyperdense vessel or abnormal calcification. Skull: Normal. Negative for fracture or focal lesion. Sinuses/Orbits: Minimal mucosal thickening of left sphenoid sinus. No fluid levels. Trace opacification of lower left mastoid air cells, unchanged. Other: None. IMPRESSION: No acute intracranial abnormality. Stable atrophy and chronic small vessel ischemia. Electronically Signed   By: Rubye Oaks M.D.   On: 08/11/2016 06:48   Dg Chest Port 1 View  Result Date: 08/21/2016 CLINICAL DATA:  Dark stools, ongoing weakness, anemia, shortness of breath with exertion, history diabetes mellitus, hypertension, atrial fibrillation, coronary artery disease post MI, CHF, prior stroke, sleep apnea EXAM: PORTABLE CHEST 1 VIEW COMPARISON:  Portable exam 1543 hours compared to 08/10/2016 FINDINGS: Rotated to the LEFT. Upper normal heart size post CABG. Atherosclerotic calcification aorta. Mediastinal contours and pulmonary vascularity normal. Bibasilar atelectasis without  gross infiltrate, pleural effusion, or pneumothorax. Bones demineralized. IMPRESSION: Post CABG with aortic atherosclerosis. Bibasilar atelectasis. Electronically Signed   By: Ulyses Southward M.D.   On: 08/21/2016 15:56   Dg Chest Port 1 View  Result Date: 08/10/2016 CLINICAL DATA:  Shortness of breath, weakness. EXAM: PORTABLE CHEST 1 VIEW COMPARISON:  Radiographs 5 days prior 08/05/2016 FINDINGS: Stable  cardiomegaly, post median sternotomy and CABG. Atherosclerosis of the thoracic aorta. Trace chronic blunting of the costophrenic angles. No pulmonary edema. No focal airspace disease. No pneumothorax. Multiple overlying monitoring devices project over the lower chest. IMPRESSION: Stable cardiomegaly post CABG.  Thoracic aortic atherosclerosis. No acute abnormality. Electronically Signed   By: Rubye Oaks M.D.   On: 08/10/2016 23:15    DISCHARGE EXAMINATION: Vitals:   08/22/16 1126 08/22/16 2241 08/23/16 0608 08/23/16 0908  BP: (!) 128/58 (!) 125/51 (!) 118/59 (!) 126/33  Pulse: 88 100 91 (!) 104  Resp: Temp: 97.4 F (36.3 C)  98.9 F (37.2 C)   TempSrc: Oral  Oral   SpO2: 98% 97% 95% 98%  Weight:   100.8 kg (222 lb 4.8 oz)   Height:       General appearance: alert, cooperative, appears stated age and no distress Resp: clear to auscultation bilaterally Cardio: regular rate and rhythm, S1, S2 normal, no murmur, click, rub or gallop GI: soft, non-tender; bowel sounds normal; no masses,  no organomegaly   DISPOSITION: Home with home health  Discharge Instructions    (HEART FAILURE PATIENTS) Call MD:  Anytime you have any of the following symptoms: 1) 3 pound weight gain in 24 hours or 5 pounds in 1 week 2) shortness of breath, with or without a dry hacking cough 3) swelling in the hands, feet or stomach 4) if you have to sleep on extra pillows at night in order to breathe.    Complete by:  As directed    Call MD for:  difficulty breathing, headache or visual disturbances    Complete by:  As directed    Call MD for:  persistant dizziness or light-headedness    Complete by:  As directed    Call MD for:  persistant nausea and vomiting    Complete by:  As directed    Call MD for:  severe uncontrolled pain    Complete by:  As directed    Call MD for:  temperature >100.4    Complete by:  As directed    Diet - low sodium heart healthy    Complete by:  As directed     Discharge instructions    Complete by:  As directed    The cardiology office will schedule an appointment early next week for blood work. Please only take the medications listed on your discharge instructions. Do not take the metolazone that was prescribed to you during your last hospitalization. Note the change in the dose of Lasix, which you can start taking from tomorrow. Please also note that we have stopped another blood pressure medicine (Vasotec/enalapril) for now. Please seek attention immediately if symptoms recur. Home health will be ordered for you.  You were cared for by a hospitalist during your hospital stay. If you have any questions about your discharge medications or the care you received while you were in the hospital after you are discharged, you can call the unit and asked to speak with the hospitalist on call if the hospitalist that took care of you is not available. Once  you are discharged, your primary care physician will handle any further medical issues. Please note that NO REFILLS for any discharge medications will be authorized once you are discharged, as it is imperative that you return to your primary care physician (or establish a relationship with a primary care physician if you do not have one) for your aftercare needs so that they can reassess your need for medications and monitor your lab values. If you do not have a primary care physician, you can call [phone] for a physician referral.   Increase activity slowly    Complete by:  As directed       ALLERGIES: No Known Allergies   Current Discharge Medication List    CONTINUE these medications which have CHANGED   Details  furosemide (LASIX) 40 MG tablet Take 1 tablet (40 mg total) by mouth 2 (two) times daily. Qty: 90 tablet, Refills: 0      CONTINUE these medications which have NOT CHANGED   Details  allopurinol (ZYLOPRIM) 100 MG tablet TAKE 2 TABLETS BY MOUTH DAILY. Qty: 30 tablet, Refills: 0     clopidogrel (PLAVIX) 75 MG tablet Take 1 tablet (75 mg total) by mouth daily with breakfast. Qty: 30 tablet, Refills: 0    ferrous sulfate 325 (65 FE) MG tablet Take 1 tablet (325 mg total) by mouth 2 (two) times daily with a meal. Qty: 90 tablet, Refills: 0    gabapentin (NEURONTIN) 100 MG capsule Take 100 mg by mouth 3 (three) times daily.     glipiZIDE (GLUCOTROL) 5 MG tablet Take 5 mg by mouth 2 (two) times daily before a meal.    hydrALAZINE (APRESOLINE) 100 MG tablet Take 100 mg by mouth 2 (two) times daily.    insulin aspart protamine- aspart (NOVOLOG MIX 70/30) (70-30) 100 UNIT/ML injection Inject 100 Units into the skin 3 (three) times daily before meals.    isosorbide mononitrate (IMDUR) 60 MG 24 hr tablet Take 1 tablet (60 mg total) by mouth daily. Appointment needed for future refills Qty: 30 tablet, Refills: 0    Multiple Vitamin (MULTIVITAMIN WITH MINERALS) TABS tablet Take 1 tablet by mouth daily.    pantoprazole (PROTONIX) 40 MG tablet Take 40 mg by mouth 2 (two) times daily before a meal.     pravastatin (PRAVACHOL) 40 MG tablet Take 40 mg by mouth at bedtime.     PRESCRIPTION MEDICATION Inhale into the lungs at bedtime. CPAP    Psyllium (METAMUCIL PO) Take 2 capsules by mouth at bedtime.    QUEtiapine (SEROQUEL) 50 MG tablet Take 50 mg by mouth daily with supper.    traMADol (ULTRAM) 50 MG tablet Take 50-100 mg by mouth 3 (three) times daily as needed (pain).     warfarin (COUMADIN) 4 MG tablet Take 4 mg by mouth daily with supper.      STOP taking these medications     enalapril (VASOTEC) 20 MG tablet          Follow-up Information    Barrett, Bjorn Loser, PA-C Follow up on 08/28/2016.   Specialties:  Cardiology, Radiology Why:  Please arrive at 9:15 am for a 9:30 am appt.  Contact information: [address] [place] [address] [phone]        [facility] Follow up.   Specialty:  Home Health Services Why:   They will continue to do your HHC at your home Contact information: [address] [place] [address] [phone]  TOTAL DISCHARGE TIME: 35 minutes  [facility]  [facility] Pager [phone]  08/23/2016, 1:02 PM

## 2016-08-23 NOTE — Progress Notes (Signed)
Spoke w/ Dr Rito Ehrlich by phone.  Pt had been admitted from the office 04/10 for hyponatremia w/ Na 121 and ARF.  Lasix had been 60 mg bid w/ Metolazone 2.5 mg 3 x week.  Pt euvolemic and ready for d/c.  Rec resume Lasix tomorrow at 40 mg bid and d/c metolazone. Hold ACE/ARB till renal function confirmed stable on diuretics.  Have made him f/u appt next week.  Theodore Demark, Cordelia Poche 08/23/2016 10:50 AM Beeper [phone]

## 2016-08-23 NOTE — Progress Notes (Signed)
Pt resting well on Cpap no issues to report

## 2016-08-23 NOTE — Progress Notes (Signed)
ANTICOAGULATION CONSULT NOTE  Pharmacy Consult for warfarin Indication: atrial fibrillation and history of DVT  No Known Allergies  Vital Signs: Temp: 98.9 F (37.2 C) (04/12 0608) Temp Source: Oral (04/12 0608) BP: 118/59 (04/12 0608) Pulse Rate: 91 (04/12 0608)  Labs:  Recent Labs  08/21/16 1156 08/22/16 0312 08/23/16 0257  HGB 10.3* 10.4* 10.1*  HCT 32.0* 32.8* 32.9*  PLT 310 275 279  LABPROT 22.7* 21.8* 19.9*  INR 1.96 1.88 1.67  CREATININE 3.09* 2.18* 1.78*    Estimated Creatinine Clearance: 40.6 mL/min (A) (by C-G formula based on SCr of 1.78 mg/dL (H)).  Assessment: 76 yo male with history of afib. Pharmacy consulted to dose warfarin. INR this AM = 1.67, decreased, dose missed 4/10  HgB stable (? GI bleed)  Goal of Therapy:  INR 2-3 Monitor platelets by anticoagulation protocol: Yes   Plan:  Warfarin 6 mg po x 1 tonight Daily INR  Thank you Okey Regal, PharmD [phone]  08/23/2016,8:51 AM

## 2016-08-24 ENCOUNTER — Telehealth: Payer: Self-pay | Admitting: Physician Assistant

## 2016-08-24 NOTE — Telephone Encounter (Signed)
08/24/2016 Received faxed medical records from [facility] for upcoming appointment with Theodore Demark on 08/28/2016.

## 2016-08-26 DIAGNOSIS — N183 Chronic kidney disease, stage 3 (moderate): Secondary | ICD-10-CM | POA: Diagnosis not present

## 2016-08-26 DIAGNOSIS — R2689 Other abnormalities of gait and mobility: Secondary | ICD-10-CM | POA: Diagnosis not present

## 2016-08-26 DIAGNOSIS — M6281 Muscle weakness (generalized): Secondary | ICD-10-CM | POA: Diagnosis not present

## 2016-08-26 DIAGNOSIS — I130 Hypertensive heart and chronic kidney disease with heart failure and stage 1 through stage 4 chronic kidney disease, or unspecified chronic kidney disease: Secondary | ICD-10-CM | POA: Diagnosis not present

## 2016-08-26 DIAGNOSIS — I5033 Acute on chronic diastolic (congestive) heart failure: Secondary | ICD-10-CM | POA: Diagnosis not present

## 2016-08-26 DIAGNOSIS — E1122 Type 2 diabetes mellitus with diabetic chronic kidney disease: Secondary | ICD-10-CM | POA: Diagnosis not present

## 2016-08-27 DIAGNOSIS — I130 Hypertensive heart and chronic kidney disease with heart failure and stage 1 through stage 4 chronic kidney disease, or unspecified chronic kidney disease: Secondary | ICD-10-CM | POA: Diagnosis not present

## 2016-08-27 DIAGNOSIS — Z7901 Long term (current) use of anticoagulants: Secondary | ICD-10-CM | POA: Diagnosis not present

## 2016-08-27 DIAGNOSIS — I4891 Unspecified atrial fibrillation: Secondary | ICD-10-CM | POA: Diagnosis not present

## 2016-08-27 DIAGNOSIS — N183 Chronic kidney disease, stage 3 (moderate): Secondary | ICD-10-CM | POA: Diagnosis not present

## 2016-08-27 DIAGNOSIS — E1122 Type 2 diabetes mellitus with diabetic chronic kidney disease: Secondary | ICD-10-CM | POA: Diagnosis not present

## 2016-08-27 DIAGNOSIS — E119 Type 2 diabetes mellitus without complications: Secondary | ICD-10-CM | POA: Diagnosis not present

## 2016-08-27 DIAGNOSIS — I10 Essential (primary) hypertension: Secondary | ICD-10-CM | POA: Diagnosis not present

## 2016-08-27 DIAGNOSIS — F518 Other sleep disorders not due to a substance or known physiological condition: Secondary | ICD-10-CM | POA: Diagnosis not present

## 2016-08-27 DIAGNOSIS — M6281 Muscle weakness (generalized): Secondary | ICD-10-CM | POA: Diagnosis not present

## 2016-08-27 DIAGNOSIS — R2689 Other abnormalities of gait and mobility: Secondary | ICD-10-CM | POA: Diagnosis not present

## 2016-08-27 DIAGNOSIS — I5033 Acute on chronic diastolic (congestive) heart failure: Secondary | ICD-10-CM | POA: Diagnosis not present

## 2016-08-27 DIAGNOSIS — I509 Heart failure, unspecified: Secondary | ICD-10-CM | POA: Diagnosis not present

## 2016-08-28 ENCOUNTER — Ambulatory Visit (INDEPENDENT_AMBULATORY_CARE_PROVIDER_SITE_OTHER): Payer: Medicare Other | Admitting: Physician Assistant

## 2016-08-28 ENCOUNTER — Encounter: Payer: Self-pay | Admitting: Physician Assistant

## 2016-08-28 VITALS — BP 122/64 | HR 100 | Ht 67.0 in | Wt 237.0 lb

## 2016-08-28 DIAGNOSIS — Z7901 Long term (current) use of anticoagulants: Secondary | ICD-10-CM | POA: Diagnosis not present

## 2016-08-28 DIAGNOSIS — N183 Chronic kidney disease, stage 3 (moderate): Secondary | ICD-10-CM | POA: Diagnosis not present

## 2016-08-28 DIAGNOSIS — I130 Hypertensive heart and chronic kidney disease with heart failure and stage 1 through stage 4 chronic kidney disease, or unspecified chronic kidney disease: Secondary | ICD-10-CM | POA: Diagnosis not present

## 2016-08-28 DIAGNOSIS — I4819 Other persistent atrial fibrillation: Secondary | ICD-10-CM

## 2016-08-28 DIAGNOSIS — I5033 Acute on chronic diastolic (congestive) heart failure: Secondary | ICD-10-CM | POA: Diagnosis not present

## 2016-08-28 DIAGNOSIS — E1122 Type 2 diabetes mellitus with diabetic chronic kidney disease: Secondary | ICD-10-CM | POA: Diagnosis not present

## 2016-08-28 DIAGNOSIS — I5032 Chronic diastolic (congestive) heart failure: Secondary | ICD-10-CM | POA: Diagnosis not present

## 2016-08-28 DIAGNOSIS — M6281 Muscle weakness (generalized): Secondary | ICD-10-CM | POA: Diagnosis not present

## 2016-08-28 DIAGNOSIS — I481 Persistent atrial fibrillation: Secondary | ICD-10-CM

## 2016-08-28 DIAGNOSIS — R2689 Other abnormalities of gait and mobility: Secondary | ICD-10-CM | POA: Diagnosis not present

## 2016-08-28 DIAGNOSIS — Z79899 Other long term (current) drug therapy: Secondary | ICD-10-CM | POA: Diagnosis not present

## 2016-08-28 DIAGNOSIS — I482 Chronic atrial fibrillation: Secondary | ICD-10-CM | POA: Diagnosis not present

## 2016-08-28 LAB — BASIC METABOLIC PANEL
BUN: 24 mg/dL (ref 7–25)
CO2: 28 mmol/L (ref 20–31)
Calcium: 9.5 mg/dL (ref 8.6–10.3)
Chloride: 97 mmol/L — ABNORMAL LOW (ref 98–110)
Creat: 1.77 mg/dL — ABNORMAL HIGH (ref 0.70–1.18)
Glucose, Bld: 133 mg/dL — ABNORMAL HIGH (ref 65–99)
Potassium: 3.6 mmol/L (ref 3.5–5.3)
Sodium: 138 mmol/L (ref 135–146)

## 2016-08-28 NOTE — Patient Instructions (Signed)
Medication Instructions:  .Your physician recommends that you continue on your current medications as directed. Please refer to the Current Medication list given to you today.  -OK TO TAKE EXTRA LASIX  AS NEED WITH WEIGHT GAIN >3 LBS DAILY-OR- >5 LBS IN A WEEK  If you need a refill on your cardiac medications before your next appointment, please call your pharmacy.  Labwork: BMP TODAY AT [facility] ON THE 1ST FLOOR  Follow-Up: Your physician wants you to follow-up in: KEEP APPT WITH DR Allyson Sabal 09-19-2016 @ 9AM.  Special Instructions: CONTINUE DAILY WEIGHTS    Thank you for choosing [facility] at [facility]!!    RHONDA BARRETT, Franchot Gallo, LPN

## 2016-08-28 NOTE — Progress Notes (Signed)
Cardiology Office Note   Date:  08/28/2016   ID:  Isaiah [name], DOB 1940-07-05, MRN [id number]  PCP:  Burman Blacksmith., MD  Cardiologist:  Dr Allyson Sabal, 08/17/2015  Theodore Demark, PA-C 08/21/2016  Chief Complaint  Patient presents with  . Follow-up    History of Present Illness: Isaiah [name] is a 76 y.o. male with a history of HTN, IDDM, CVA, D-CHF, DVT, PAF on coumadin.  Admit 03/30-04/07/2016 for acute on chronic D-CHF and iron-def anemia with +FOBT, INR was subtherapeutic. D/c on oxygen. Cr 1.5 at discharge. Admit 04/10-04/12 with hyponatremia, ARF, Na+ 121, BUN/Cr 63/3.09. At d/c, Na+ 131, BUN/Cr 43/1.78. Lasix resumed at 40 mg bid, off metolazone.  Isaiah [name] presents for post-hospital follow up.  He saw Dr [place] (PCP) yesterday. She was concerned that he had some fluid in his lungs.   Pt feels that his breathing is doing better. He is living with his daughter-in-law in a 1 level house and she is able to care for him more easily than his daughter could at this time. His daughter has 2 small children and steps are unavoidable. She helps with his care and his medications.   He has not been weighing himself daily but can start. Daughter-in-law is trying to make sure he eats healthy and hopes to get him to lose some weight. He is starting to walk a little more and plans to increase this. Denies LE edema, PND. Has some chronic orthopnea, no change. No chest pain. No palpitations, no presyncope or syncope. No falls. Uses a walker.   Past Medical History:  Diagnosis Date  . Atrial fibrillation (HCC)   . CAD (coronary artery disease)   . CHF (congestive heart failure) (HCC)   . Diabetes mellitus   . DVT (deep venous thrombosis) (HCC)   . Fall at home 10/2015  . Hyperlipidemia   . Hypertension   . Left-sided carotid artery disease (HCC)   . Myocardial infarction (HCC)   . NSTEMI (non-ST elevated myocardial infarction) (HCC)   . Peripheral vascular disease (HCC)   .  Stroke (HCC)   . Venous insufficiency     Past Surgical History:  Procedure Laterality Date  . CORONARY ARTERY BYPASS GRAFT  1997   vein harvest from right leg  . CORONARY STENT INTERVENTION N/A 08/07/2016   Procedure: Coronary Stent Intervention;  Surgeon: Corky Crafts, MD;  [facility] INVASIVE CV LAB;  Service: Cardiovascular;  Laterality: N/A;  RCA  . ESOPHAGOGASTRODUODENOSCOPY N/A 08/12/2016   Procedure: ESOPHAGOGASTRODUODENOSCOPY (EGD);  Surgeon: Carman Ching, MD;  Location: [facility] ENDOSCOPY;  Service: Endoscopy;  Laterality: N/A;  . INGUINAL HERNIA REPAIR  1980's   Bilateral,  done in [place]  . LEFT HEART CATH AND CORS/GRAFTS ANGIOGRAPHY N/A 08/07/2016   Procedure: Left Heart Cath and Cors/Grafts Angiography;  Surgeon: Corky Crafts, MD;  Location: [facility] INVASIVE CV LAB;  Service: Cardiovascular;  Laterality: N/A;  . PR VEIN BYPASS GRAFT,AORTO-FEM-POP  1980   FEM-FEM BPG by [name]    Current Outpatient Prescriptions  Medication Sig Dispense Refill  . allopurinol (ZYLOPRIM) 100 MG tablet TAKE 2 TABLETS BY MOUTH DAILY. (Patient taking differently: TAKE 1 TABLET BY MOUTH TWICE DAILY) 30 tablet 0  . clopidogrel (PLAVIX) 75 MG tablet Take 1 tablet (75 mg total) by mouth daily with breakfast. 30 tablet 0  . ferrous sulfate 325 (65 FE) MG tablet Take 1 tablet (325 mg total) by mouth 2 (two) times daily with a meal.  90 tablet 0  . furosemide (LASIX) 40 MG tablet Take 1 tablet (40 mg total) by mouth 2 (two) times daily. 90 tablet 0  . gabapentin (NEURONTIN) 100 MG capsule Take 100 mg by mouth 3 (three) times daily.     Marland Kitchen glipiZIDE (GLUCOTROL) 5 MG tablet Take 5 mg by mouth 2 (two) times daily before a meal.    . hydrALAZINE (APRESOLINE) 100 MG tablet Take 100 mg by mouth 2 (two) times daily.    . insulin aspart protamine- aspart (NOVOLOG MIX 70/30) (70-30) 100 UNIT/ML injection Inject 100 Units into the skin 3 (three) times daily before meals.    . isosorbide mononitrate  (IMDUR) 60 MG 24 hr tablet Take 1 tablet (60 mg total) by mouth daily. Appointment needed for future refills 30 tablet 0  . Multiple Vitamin (MULTIVITAMIN WITH MINERALS) TABS tablet Take 1 tablet by mouth daily.    . pantoprazole (PROTONIX) 40 MG tablet Take 40 mg by mouth 2 (two) times daily before a meal.     . pravastatin (PRAVACHOL) 40 MG tablet Take 40 mg by mouth at bedtime.     Marland Kitchen PRESCRIPTION MEDICATION Inhale into the lungs at bedtime. CPAP    . Psyllium (METAMUCIL PO) Take 2 capsules by mouth at bedtime.    Marland Kitchen QUEtiapine (SEROQUEL) 50 MG tablet Take 50 mg by mouth daily with supper.    . traMADol (ULTRAM) 50 MG tablet Take 50-100 mg by mouth 3 (three) times daily as needed (pain).     Marland Kitchen warfarin (COUMADIN) 4 MG tablet Take 4 mg by mouth daily with supper.     No current facility-administered medications for this visit.     Allergies:   Patient has no known allergies.    Social History:  The patient  reports that he quit smoking about 38 years ago. His smoking use included Cigarettes. He has never used smokeless tobacco. He reports that he does not drink alcohol or use drugs.   Family History:  The patient's family history includes Cancer in his father; Diabetes in his daughter, father, and sister; Heart attack in his brother and father; Heart disease in his father, mother, and sister; Hyperlipidemia in his brother, daughter, father, mother, and sister; Hypertension in his brother, daughter, father, mother, sister, and son; Other in his brother, daughter, father, mother, and sister; Varicose Veins in his daughter, mother, and sister.    ROS:  Please see the history of present illness. All other systems are reviewed and negative.    PHYSICAL EXAM: VS:  BP 122/64   Pulse 100   Ht  (1.702 m)   Wt 237 lb (107.5 kg)   BMI 37.12 kg/m  , BMI Body mass index is 37.12 kg/m. GEN: Well nourished, well developed, male in no acute distress  HEENT: normal for age  Neck: minimal JVD,  bilateral carotid bruits, no masses Cardiac: Irreg R&R; no murmur, no rubs, or gallops Respiratory:  Decreased BS bases bilaterally, normal work of breathing GI: soft, nontender, nondistended, + BS MS: no deformity or atrophy; no edema; distal pulses are 2+ in all 4 extremities   Skin: warm and dry, no rash Neuro:  Strength and sensation are intact Psych: euthymic mood, full affect   EKG:  EKG is ordered today. The ekg ordered today demonstrates Atrial fib, rate 81, no acute ischemic changes   Recent Labs: 08/11/2016: TSH 2.504 08/12/2016: Magnesium 2.2 08/21/2016: ALT 17; B Natriuretic Peptide 120.7 08/23/2016: BUN 43; Creatinine, Ser 1.78; Hemoglobin  10.1; Platelets 279; Potassium 4.4; Sodium 131    Lipid Panel    Component Value Date/Time   CHOL 79 08/07/2016 1054   TRIG 154 (H) 08/07/2016 1054   HDL 26 (L) 08/07/2016 1054   CHOLHDL 3.0 08/07/2016 1054   VLDL 31 08/07/2016 1054   LDLCALC 22 08/07/2016 1054   LDLDIRECT 43 01/28/2013 1127     Wt Readings from Last 3 Encounters:  08/28/16 237 lb (107.5 kg)  08/23/16 222 lb 4.8 oz (100.8 kg)  08/21/16 244 lb 3.2 oz (110.8 kg)     Other studies Reviewed: Additional studies/ records that were reviewed today include: office notes, hospital records and testing.  ASSESSMENT AND PLAN:  1.  Chronic diastolic CHF: Pt weight is up on our scales, but respiratory status is good by exam and by symptoms. He is to continue Lasix at the current dose, OK to take an extra 40 mg Lasix daily prn for weight gain. He has good family support, that will help. It is easy for him to increase his activity with his walker, he is encouraged to do this. Recheck BMET today.  2. Afib: He is in this today. Duration unclear. His rate is controlled, continue coumadin. Managed by his PCP.   Current medicines are reviewed at length with the patient today.  The patient does not have concerns regarding medicines.  The following changes have been made:  Add  prn Lasix to standing dose  Labs/ tests ordered today include: No orders of the defined types were placed in this encounter.    Disposition:   FU with Dr Allyson Sabal  Signed, Leanna Battles  08/28/2016 9:57 AM    [facility] Phone: [phone]; Fax: [phone]  This note was written with the assistance of speech recognition software. Please excuse any transcriptional errors.

## 2016-08-29 ENCOUNTER — Ambulatory Visit (INDEPENDENT_AMBULATORY_CARE_PROVIDER_SITE_OTHER)
Admission: RE | Admit: 2016-08-29 | Discharge: 2016-08-29 | Disposition: A | Discharge status: Home / Self Care | Payer: Medicare Other | Source: Ambulatory Visit | Attending: Vascular Surgery | Admitting: Vascular Surgery

## 2016-08-29 ENCOUNTER — Ambulatory Visit (INDEPENDENT_AMBULATORY_CARE_PROVIDER_SITE_OTHER): Payer: Medicare Other | Admitting: Family

## 2016-08-29 ENCOUNTER — Encounter: Payer: Self-pay | Admitting: Family

## 2016-08-29 ENCOUNTER — Ambulatory Visit (HOSPITAL_COMMUNITY)
Admission: RE | Admit: 2016-08-29 | Discharge: 2016-08-29 | Disposition: A | Discharge status: Home / Self Care | Payer: Medicare Other | Source: Ambulatory Visit | Attending: Vascular Surgery | Admitting: Vascular Surgery

## 2016-08-29 VITALS — BP 134/61 | HR 84 | Temp 98.7°F | Resp 18 | Ht 67.0 in | Wt 237.6 lb

## 2016-08-29 DIAGNOSIS — I739 Peripheral vascular disease, unspecified: Secondary | ICD-10-CM

## 2016-08-29 DIAGNOSIS — I6523 Occlusion and stenosis of bilateral carotid arteries: Secondary | ICD-10-CM | POA: Diagnosis not present

## 2016-08-29 DIAGNOSIS — I872 Venous insufficiency (chronic) (peripheral): Secondary | ICD-10-CM | POA: Diagnosis not present

## 2016-08-29 DIAGNOSIS — E1151 Type 2 diabetes mellitus with diabetic peripheral angiopathy without gangrene: Secondary | ICD-10-CM

## 2016-08-29 DIAGNOSIS — I70213 Atherosclerosis of native arteries of extremities with intermittent claudication, bilateral legs: Secondary | ICD-10-CM

## 2016-08-29 DIAGNOSIS — I7389 Other specified peripheral vascular diseases: Secondary | ICD-10-CM | POA: Insufficient documentation

## 2016-08-29 DIAGNOSIS — Z87891 Personal history of nicotine dependence: Secondary | ICD-10-CM | POA: Diagnosis not present

## 2016-08-29 DIAGNOSIS — R0989 Other specified symptoms and signs involving the circulatory and respiratory systems: Secondary | ICD-10-CM | POA: Diagnosis present

## 2016-08-29 NOTE — Patient Instructions (Signed)
Peripheral Vascular Disease Peripheral vascular disease (PVD) is a disease of the blood vessels that are not part of your heart and brain. A simple term for PVD is poor circulation. In most cases, PVD narrows the blood vessels that carry blood from your heart to the rest of your body. This can result in a decreased supply of blood to your arms, legs, and internal organs, like your stomach or kidneys. However, it most often affects a person's lower legs and feet. There are two types of PVD.  Organic PVD. This is the more common type. It is caused by damage to the structure of blood vessels.  Functional PVD. This is caused by conditions that make blood vessels contract and tighten (spasm). Without treatment, PVD tends to get worse over time. PVD can also lead to acute ischemic limb. This is when an arm or limb suddenly has trouble getting enough blood. This is a medical emergency. Follow these instructions at home:  Take medicines only as told by your doctor.  Do not use any tobacco products, including cigarettes, chewing tobacco, or electronic cigarettes. If you need help quitting, ask your doctor.  Lose weight if you are overweight, and maintain a healthy weight as told by your doctor.  Eat a diet that is low in fat and cholesterol. If you need help, ask your doctor.  Exercise regularly. Ask your doctor for some good activities for you.  Take good care of your feet.  Wear comfortable shoes that fit well.  Check your feet often for any cuts or sores. Contact a doctor if:  You have cramps in your legs while walking.  You have leg pain when you are at rest.  You have coldness in a leg or foot.  Your skin changes.  You are unable to get or have an erection (erectile dysfunction).  You have cuts or sores on your feet that are not healing. Get help right away if:  Your arm or leg turns cold and blue.  Your arms or legs become red, warm, swollen, painful, or numb.  You have  chest pain or trouble breathing.  You suddenly have weakness in your face, arm, or leg.  You become very confused or you cannot speak.  You suddenly have a very bad headache.  You suddenly cannot see. This information is not intended to replace advice given to you by your health care provider. Make sure you discuss any questions you have with your health care provider. Document Released: 07/25/2009 Document Revised: 10/06/2015 Document Reviewed: 10/08/2013 Elsevier Interactive Patient Education  2017 [employer]    Stroke Prevention Some medical conditions and behaviors are associated with an increased chance of having a stroke. You may prevent a stroke by making healthy choices and managing medical conditions. How can I reduce my risk of having a stroke?  Stay physically active. Get at least 30 minutes of activity on most or all days.  Do not smoke. It may also be helpful to avoid exposure to secondhand smoke.  Limit alcohol use. Moderate alcohol use is considered to be:  No more than 2 drinks per day for men.  No more than 1 drink per day for nonpregnant women.  Eat healthy foods. This involves:  Eating 5 or more servings of fruits and vegetables a day.  Making dietary changes that address high blood pressure (hypertension), high cholesterol, diabetes, or obesity.  Manage your cholesterol levels.  Making food choices that are high in fiber and low in saturated fat,   trans fat, and cholesterol may control cholesterol levels.  Take any prescribed medicines to control cholesterol as directed by your health care provider.  Manage your diabetes.  Controlling your carbohydrate and sugar intake is recommended to manage diabetes.  Take any prescribed medicines to control diabetes as directed by your health care provider.  Control your hypertension.  Making food choices that are low in salt (sodium), saturated fat, trans fat, and cholesterol is recommended to manage  hypertension.  Ask your health care provider if you need treatment to lower your blood pressure. Take any prescribed medicines to control hypertension as directed by your health care provider.  If you are 18-39 years of age, have your blood pressure checked every 3-5 years. If you are 40 years of age or older, have your blood pressure checked every year.  Maintain a healthy weight.  Reducing calorie intake and making food choices that are low in sodium, saturated fat, trans fat, and cholesterol are recommended to manage weight.  Stop drug abuse.  Avoid taking birth control pills.  Talk to your health care provider about the risks of taking birth control pills if you are over 35 years old, smoke, get migraines, or have ever had a blood clot.  Get evaluated for sleep disorders (sleep apnea).  Talk to your health care provider about getting a sleep evaluation if you snore a lot or have excessive sleepiness.  Take medicines only as directed by your health care provider.  For some people, aspirin or blood thinners (anticoagulants) are helpful in reducing the risk of forming abnormal blood clots that can lead to stroke. If you have the irregular heart rhythm of atrial fibrillation, you should be on a blood thinner unless there is a good reason you cannot take them.  Understand all your medicine instructions.  Make sure that other conditions (such as anemia or atherosclerosis) are addressed. Get help right away if:  You have sudden weakness or numbness of the face, arm, or leg, especially on one side of the body.  Your face or eyelid droops to one side.  You have sudden confusion.  You have trouble speaking (aphasia) or understanding.  You have sudden trouble seeing in one or both eyes.  You have sudden trouble walking.  You have dizziness.  You have a loss of balance or coordination.  You have a sudden, severe headache with no known cause.  You have new chest pain or an  irregular heartbeat. Any of these symptoms may represent a serious problem that is an emergency. Do not wait to see if the symptoms will go away. Get medical help at once. Call your local emergency services (911 in [place] Do not drive yourself to the hospital. This information is not intended to replace advice given to you by your health care provider. Make sure you discuss any questions you have with your health care provider. Document Released: 06/07/2004 Document Revised: 10/06/2015 Document Reviewed: 10/31/2012 Elsevier Interactive Patient Education  2017 [employer].   

## 2016-08-29 NOTE — Progress Notes (Signed)
[facility] [place] HISTORY AND PHYSICAL   MRN : [id number]  History of Present Illness:   Isaiah [name] is a 76 y.o. male patient that Dr. Scot Dock evaluated in March 2015. At that time he had 40-59% right carotid stenosis with no significant stenosis on the left. There had been no significant change compared to a study in September 2014. He had a previous CT angiogram which suggested a greater than 80% right carotid stenosis however we did not find this on duplex in September 2014.  In May 2014 he had an episode of transient loss of vision in both eyes. He had been having problems with low blood pressure and dizziness at that time also.  Patient has not had previous carotid artery intervention.  The patient denies any history of TIA or stroke symptoms, specifically the patient denies a history of amaurosis fugax or monocular blindness, denies a history unilateral of facial drooping, denies a history of hemiplegia, and denies a history of receptive or expressive aphasia.   He has a history of fem-fem bypass graft in 1980, infected graft removed about 2012 by Dr. Scot Dock. His legs feel like "they are going to give out" after walking about 200 feet; his breathing also limits his walking.  He has a hx of cellulitis in both lower legs, more than once.  He uses CPAP for OSA.   He was admitted to [facility] on 03/30-04/07/2016 for acute on chronic D-CHF and iron-def anemia with +FOBT, INR was subtherapeutic. D/c on oxygen. Cr 1.5 at discharge. He was then admitted to [facility] on 04/10-04/12 with hyponatremia, ARF, Na+ 121, BUN/Cr 63/3.09. At d/c, Na+ 131, BUN/Cr 43/1.78. Lasix resumed at 40 mg bid, off metolazone. Pt was evaluated yesterday at his cardiologist office by R. Barrett, PA-C.   Pt Diabetic: yes, he was taking U-500, very insulin resistant, but could no longer afford, is now taking 100 units 70/30 bid and 80 units at HS. His endocrinologist is Dr. Iran Planas Pt  smoker: former smoker, quit in 1980  Pt meds include: Statin : yes ASA: no Other anticoagulants/antiplatelets: coumadin, has a history of atrial fib, he is in and out of atrial fib, states he started coumadin for possible DVT, and Plavix     Current Outpatient Prescriptions  Medication Sig Dispense Refill  . allopurinol (ZYLOPRIM) 100 MG tablet TAKE 2 TABLETS BY MOUTH DAILY. (Patient taking differently: TAKE 1 TABLET BY MOUTH TWICE DAILY) 30 tablet 0  . clopidogrel (PLAVIX) 75 MG tablet Take 1 tablet (75 mg total) by mouth daily with breakfast. 30 tablet 0  . ferrous sulfate 325 (65 FE) MG tablet Take 1 tablet (325 mg total) by mouth 2 (two) times daily with a meal. 90 tablet 0  . furosemide (LASIX) 40 MG tablet Take 1 tablet (40 mg total) by mouth 2 (two) times daily. 90 tablet 0  . gabapentin (NEURONTIN) 100 MG capsule Take 100 mg by mouth 3 (three) times daily.     Marland Kitchen glipiZIDE (GLUCOTROL) 5 MG tablet Take 5 mg by mouth 2 (two) times daily before a meal.    . hydrALAZINE (APRESOLINE) 100 MG tablet Take 100 mg by mouth 2 (two) times daily.    . insulin aspart protamine- aspart (NOVOLOG MIX 70/30) (70-30) 100 UNIT/ML injection Inject 100 Units into the skin 3 (three) times daily before meals.    . isosorbide mononitrate (IMDUR) 60 MG 24 hr tablet Take 1 tablet (60 mg total) by mouth daily. Appointment needed for  future refills 30 tablet 0  . Multiple Vitamin (MULTIVITAMIN WITH MINERALS) TABS tablet Take 1 tablet by mouth daily.    . pantoprazole (PROTONIX) 40 MG tablet Take 40 mg by mouth 2 (two) times daily before a meal.     . pravastatin (PRAVACHOL) 40 MG tablet Take 40 mg by mouth at bedtime.     Marland Kitchen PRESCRIPTION MEDICATION Inhale into the lungs at bedtime. CPAP    . Psyllium (METAMUCIL PO) Take 2 capsules by mouth at bedtime.    Marland Kitchen QUEtiapine (SEROQUEL) 50 MG tablet Take 50 mg by mouth daily with supper.    . traMADol (ULTRAM) 50 MG tablet Take 50-100 mg by mouth 3 (three) times daily  as needed (pain).     Marland Kitchen warfarin (COUMADIN) 4 MG tablet Take 4 mg by mouth daily with supper.     No current facility-administered medications for this visit.     Past Medical History:  Diagnosis Date  . Atrial fibrillation ([place])   . CAD (coronary artery disease)   . CHF (congestive heart failure) ([place])   . Diabetes mellitus   . DVT (deep venous thrombosis) ([place])   . Fall at home 10/2015  . Hyperlipidemia   . Hypertension   . Left-sided carotid artery disease ([place])   . Myocardial infarction ([place])   . NSTEMI (non-ST elevated myocardial infarction) ([place])   . Peripheral vascular disease ([place])   . Stroke ([place])   . Venous insufficiency     Social History Social History  Substance Use Topics  . Smoking status: Former Smoker    Types: Cigarettes    Quit date: 05/14/1978  . Smokeless tobacco: Never Used  . Alcohol use No    Family History Family History  Problem Relation Age of Onset  . Other Mother     varicose veins  . Heart disease Mother   . Hyperlipidemia Mother   . Hypertension Mother   . Varicose Veins Mother   . Cancer Father   . Diabetes Father   . Heart disease Father     before age 80  . Hyperlipidemia Father   . Hypertension Father   . Other Father     varicose veins  . Heart attack Father   . Diabetes Daughter   . Hyperlipidemia Daughter   . Hypertension Daughter   . Other Daughter     varicose veins  . Varicose Veins Daughter   . Hypertension Son   . Diabetes Sister   . Heart disease Sister     DVT  . Other Sister     varicose veins  . Hyperlipidemia Sister   . Hypertension Sister   . Varicose Veins Sister   . Other Brother     varicose veins  . Hyperlipidemia Brother   . Hypertension Brother   . Heart attack Brother     Surgical History Past Surgical History:  Procedure Laterality Date  . CORONARY ARTERY BYPASS GRAFT  1997   vein harvest from right leg  . CORONARY STENT INTERVENTION N/A 08/07/2016   Procedure: Coronary Stent  Intervention;  Surgeon: Jettie Booze, MD;  Location: [facility];  Service: Cardiovascular;  Laterality: N/A;  RCA  . ESOPHAGOGASTRODUODENOSCOPY N/A 08/12/2016   Procedure: ESOPHAGOGASTRODUODENOSCOPY (EGD);  Surgeon: Laurence Spates, MD;  Location: [facility] ENDOSCOPY;  Service: Endoscopy;  Laterality: N/A;  . INGUINAL HERNIA REPAIR  1980's   Bilateral,  done in [place]  . LEFT HEART CATH AND CORS/GRAFTS ANGIOGRAPHY N/A 08/07/2016  Procedure: Left Heart Cath and Cors/Grafts Angiography;  Surgeon: Jettie Booze, MD;  Location: [facility];  Service: Cardiovascular;  Laterality: N/A;  . PR VEIN BYPASS GRAFT,AORTO-FEM-POP  1980   FEM-FEM BPG by Dr. Deon Pilling    No Known Allergies  Current Outpatient Prescriptions  Medication Sig Dispense Refill  . allopurinol (ZYLOPRIM) 100 MG tablet TAKE 2 TABLETS BY MOUTH DAILY. (Patient taking differently: TAKE 1 TABLET BY MOUTH TWICE DAILY) 30 tablet 0  . clopidogrel (PLAVIX) 75 MG tablet Take 1 tablet (75 mg total) by mouth daily with breakfast. 30 tablet 0  . ferrous sulfate 325 (65 FE) MG tablet Take 1 tablet (325 mg total) by mouth 2 (two) times daily with a meal. 90 tablet 0  . furosemide (LASIX) 40 MG tablet Take 1 tablet (40 mg total) by mouth 2 (two) times daily. 90 tablet 0  . gabapentin (NEURONTIN) 100 MG capsule Take 100 mg by mouth 3 (three) times daily.     Marland Kitchen glipiZIDE (GLUCOTROL) 5 MG tablet Take 5 mg by mouth 2 (two) times daily before a meal.    . hydrALAZINE (APRESOLINE) 100 MG tablet Take 100 mg by mouth 2 (two) times daily.    . insulin aspart protamine- aspart (NOVOLOG MIX 70/30) (70-30) 100 UNIT/ML injection Inject 100 Units into the skin 3 (three) times daily before meals.    . isosorbide mononitrate (IMDUR) 60 MG 24 hr tablet Take 1 tablet (60 mg total) by mouth daily. Appointment needed for future refills 30 tablet 0  . Multiple Vitamin (MULTIVITAMIN WITH MINERALS) TABS tablet Take 1 tablet by mouth daily.    .  pantoprazole (PROTONIX) 40 MG tablet Take 40 mg by mouth 2 (two) times daily before a meal.     . pravastatin (PRAVACHOL) 40 MG tablet Take 40 mg by mouth at bedtime.     Marland Kitchen PRESCRIPTION MEDICATION Inhale into the lungs at bedtime. CPAP    . Psyllium (METAMUCIL PO) Take 2 capsules by mouth at bedtime.    Marland Kitchen QUEtiapine (SEROQUEL) 50 MG tablet Take 50 mg by mouth daily with supper.    . traMADol (ULTRAM) 50 MG tablet Take 50-100 mg by mouth 3 (three) times daily as needed (pain).     Marland Kitchen warfarin (COUMADIN) 4 MG tablet Take 4 mg by mouth daily with supper.     No current facility-administered medications for this visit.      REVIEW OF SYSTEMS: See HPI for pertinent positives and negatives.  Physical Examination Vitals:   08/29/16 1406 08/29/16 1408  BP: (!) 127/56 134/61  Pulse: 84   Resp: 18   Temp: 98.7 F (37.1 C)   TempSrc: Oral   SpO2: 97%   Weight: 237 lb 9.6 oz (107.8 kg)   Height: '5\' 7"'  (1.702 m)    Body mass index is 37.21 kg/m.  General:  WDWN obese male in NAD, large neck. GAIT:steady, using walker Eyes: PERRLA Pulmonary: Respirations are slightly labored at rest, distant sounds in all posterior fields, + rales in both bases, no rhonchi or wheezing.  Cardiac: Irregular rhythm, controlled rate, no detected murmur, heart sounds are distant.  VASCULAR EXAM Carotid Bruits Right Left   positive positive   Aorta is not palpable. Radial pulses are 2+ palpable and equal.       LE Pulses Right Left   FEMORAL 1+palpable  1+ palpable     POPLITEAL not palpable  not palpable   POSTERIOR TIBIAL not palpable  not palpable  DORSALIS PEDIS  ANTERIOR TIBIAL not palpable  2+ palpable    PERONEAL not Palpable   not Palpable     Gastrointestinal: soft, nontender, BS WNL, no  r/g,ventral and umbilical hernias, both reducible, obese.  Musculoskeletal: No muscle atrophy/wasting. M/S 5/5 throughout, Extremities without ischemic changes except mild erythema in both lower legs that pt states has been present since he had cellulitis, was treated. Mild venous stasis dermatitis in both lower legs. Trace bilateral pitting edema in both lower legs.  Neurologic: A&O X 3; Appropriate Affect, Speech is loud, pt is talkative, CN 2-12 intact except is hard of hearing, Pain and light touch intact in extremities, Motor exam as listed above     ASSESSMENT:  Isaiah [name] is a 76 y.o. male who has no hx of stroke or TIA. He has a history of fem-fem bypass graft in 1980, infected graft removed about 2012 by Dr. Scot Dock.   He is on coumadin with a history of recurrent atrial fib and possible history of DVT; pt states his lower legs have remained somewhat red since he was treated for cellulitis, but have improved significantly with treatment.  He has mild/moderate chronic venous stasis changes in both lower legs. He has generalized legs weakness after walking about 200 feet, resolves with rest.   He has no ischemic changes in his feet or legs. He has a history of CHF and OSA, his walking is also limited by dyspnea.  He is obese.  He has extreme insulin resistance requiring large amounts of insulin (280 units daily of mixed insulin) for some measure of glycemic control, he is under the care of an endocrinologist. Fortunately he has not smoked since 1980.    DATA: Today's carotid duplex suggests <40% bilateral ICA stenosis.Unable to achieve velocity range of 60-79% in right ICA stated on previous exams, but unchanged from the last exam on 12-16-15.   ABI's and TBI's remain unreliable due to non compressible calcified vessels. Right LE waveforms remain monophasic, left LE waveforms are stable with bi and triphasic waveforms.  Serum creatinine was 1.77 yesterday.  eGFR on  4-10-118 was 18, stage 4 CKD   PLAN:   He will be receiving [facility] visits soon, pt states HH will also instruct him in exercises. Daily seated leg exercises by HH, and I also instructed pt and his sister in daily seated leg exercises, since he is unsteady on his feet.   Based on today's exam and non-invasive vascular lab results, the patient will follow up in 6 months with the following tests: ABI's and carotid duplex.  I discussed in depth with the patient the nature of atherosclerosis, and emphasized the importance of maximal medical management including strict control of blood pressure, blood glucose, and lipid levels, obtaining regular exercise, and cessation of smoking.  The patient is aware that without maximal medical management the underlying atherosclerotic disease process will progress, limiting the benefit of any interventions.  The patient was given information about stroke prevention and what symptoms should prompt the patient to seek immediate medical care.  The patient was given information about PAD including signs, symptoms, treatment, what symptoms should prompt the patient to seek immediate medical care, and risk reduction measures to take. Thank you for allowing [place] to participate in this patient's care.  Clemon Chambers, RN, MSN, FNP-C [facility] Office: [phone]  Clinic MD: Scot Dock 08/29/2016 2:10 PM

## 2016-08-29 NOTE — Addendum Note (Signed)
Addended by: Burton Apley A on: 08/29/2016 04:36 PM   Modules accepted: Orders

## 2016-08-30 DIAGNOSIS — I5033 Acute on chronic diastolic (congestive) heart failure: Secondary | ICD-10-CM | POA: Diagnosis not present

## 2016-08-30 DIAGNOSIS — N183 Chronic kidney disease, stage 3 (moderate): Secondary | ICD-10-CM | POA: Diagnosis not present

## 2016-08-30 DIAGNOSIS — M6281 Muscle weakness (generalized): Secondary | ICD-10-CM | POA: Diagnosis not present

## 2016-08-30 DIAGNOSIS — I130 Hypertensive heart and chronic kidney disease with heart failure and stage 1 through stage 4 chronic kidney disease, or unspecified chronic kidney disease: Secondary | ICD-10-CM | POA: Diagnosis not present

## 2016-08-30 DIAGNOSIS — E1122 Type 2 diabetes mellitus with diabetic chronic kidney disease: Secondary | ICD-10-CM | POA: Diagnosis not present

## 2016-08-30 DIAGNOSIS — R2689 Other abnormalities of gait and mobility: Secondary | ICD-10-CM | POA: Diagnosis not present

## 2016-08-31 ENCOUNTER — Telehealth: Payer: Self-pay | Admitting: Cardiovascular Disease

## 2016-08-31 ENCOUNTER — Other Ambulatory Visit: Payer: Self-pay | Admitting: Cardiovascular Disease

## 2016-08-31 DIAGNOSIS — I130 Hypertensive heart and chronic kidney disease with heart failure and stage 1 through stage 4 chronic kidney disease, or unspecified chronic kidney disease: Secondary | ICD-10-CM | POA: Diagnosis not present

## 2016-08-31 DIAGNOSIS — Z7901 Long term (current) use of anticoagulants: Secondary | ICD-10-CM | POA: Diagnosis not present

## 2016-08-31 DIAGNOSIS — I5033 Acute on chronic diastolic (congestive) heart failure: Secondary | ICD-10-CM | POA: Diagnosis not present

## 2016-08-31 DIAGNOSIS — N183 Chronic kidney disease, stage 3 (moderate): Secondary | ICD-10-CM | POA: Diagnosis not present

## 2016-08-31 DIAGNOSIS — R2689 Other abnormalities of gait and mobility: Secondary | ICD-10-CM | POA: Diagnosis not present

## 2016-08-31 DIAGNOSIS — E1122 Type 2 diabetes mellitus with diabetic chronic kidney disease: Secondary | ICD-10-CM | POA: Diagnosis not present

## 2016-08-31 DIAGNOSIS — M6281 Muscle weakness (generalized): Secondary | ICD-10-CM | POA: Diagnosis not present

## 2016-08-31 MED ORDER — POTASSIUM CHLORIDE ER 10 MEQ PO TBCR: 10.0000 meq | EXTENDED_RELEASE_TABLET | Freq: Every day | ORAL | 5 refills | Status: DC

## 2016-08-31 NOTE — Telephone Encounter (Signed)
Follow Up    Pt daughter returning phone call from nurse. Requesting call back

## 2016-08-31 NOTE — Telephone Encounter (Signed)
LMTCB/results sent to MyChart  Notes recorded by Lindell Spar, RN on 08/31/2016 at 11:27 AM EDT LM on daughter, Donna's, mobile VM to return call ------  Notes recorded by Darrol Jump, PA-C on 08/30/2016 at 6:07 PM EDT Pt of Dr Allyson Sabal Please let his dtr-in-law know that his labs are stable.  He needs to take K+ 10 meq daily.  Keep f/u appt with Dr Allyson Sabal. Thanks

## 2016-09-03 DIAGNOSIS — E1122 Type 2 diabetes mellitus with diabetic chronic kidney disease: Secondary | ICD-10-CM | POA: Diagnosis not present

## 2016-09-03 DIAGNOSIS — I5033 Acute on chronic diastolic (congestive) heart failure: Secondary | ICD-10-CM | POA: Diagnosis not present

## 2016-09-03 DIAGNOSIS — N183 Chronic kidney disease, stage 3 (moderate): Secondary | ICD-10-CM | POA: Diagnosis not present

## 2016-09-03 DIAGNOSIS — M6281 Muscle weakness (generalized): Secondary | ICD-10-CM | POA: Diagnosis not present

## 2016-09-03 DIAGNOSIS — R2689 Other abnormalities of gait and mobility: Secondary | ICD-10-CM | POA: Diagnosis not present

## 2016-09-03 DIAGNOSIS — I130 Hypertensive heart and chronic kidney disease with heart failure and stage 1 through stage 4 chronic kidney disease, or unspecified chronic kidney disease: Secondary | ICD-10-CM | POA: Diagnosis not present

## 2016-09-03 NOTE — Telephone Encounter (Signed)
Left message for pt to call.

## 2016-09-04 DIAGNOSIS — M6281 Muscle weakness (generalized): Secondary | ICD-10-CM | POA: Diagnosis not present

## 2016-09-04 DIAGNOSIS — I130 Hypertensive heart and chronic kidney disease with heart failure and stage 1 through stage 4 chronic kidney disease, or unspecified chronic kidney disease: Secondary | ICD-10-CM | POA: Diagnosis not present

## 2016-09-04 DIAGNOSIS — I5033 Acute on chronic diastolic (congestive) heart failure: Secondary | ICD-10-CM | POA: Diagnosis not present

## 2016-09-04 DIAGNOSIS — N183 Chronic kidney disease, stage 3 (moderate): Secondary | ICD-10-CM | POA: Diagnosis not present

## 2016-09-04 DIAGNOSIS — Z7901 Long term (current) use of anticoagulants: Secondary | ICD-10-CM | POA: Diagnosis not present

## 2016-09-04 DIAGNOSIS — E1122 Type 2 diabetes mellitus with diabetic chronic kidney disease: Secondary | ICD-10-CM | POA: Diagnosis not present

## 2016-09-04 DIAGNOSIS — R2689 Other abnormalities of gait and mobility: Secondary | ICD-10-CM | POA: Diagnosis not present

## 2016-09-05 DIAGNOSIS — I5033 Acute on chronic diastolic (congestive) heart failure: Secondary | ICD-10-CM | POA: Diagnosis not present

## 2016-09-05 DIAGNOSIS — I130 Hypertensive heart and chronic kidney disease with heart failure and stage 1 through stage 4 chronic kidney disease, or unspecified chronic kidney disease: Secondary | ICD-10-CM | POA: Diagnosis not present

## 2016-09-05 DIAGNOSIS — N183 Chronic kidney disease, stage 3 (moderate): Secondary | ICD-10-CM | POA: Diagnosis not present

## 2016-09-05 DIAGNOSIS — M6281 Muscle weakness (generalized): Secondary | ICD-10-CM | POA: Diagnosis not present

## 2016-09-05 DIAGNOSIS — R2689 Other abnormalities of gait and mobility: Secondary | ICD-10-CM | POA: Diagnosis not present

## 2016-09-05 DIAGNOSIS — E1122 Type 2 diabetes mellitus with diabetic chronic kidney disease: Secondary | ICD-10-CM | POA: Diagnosis not present

## 2016-09-05 NOTE — Telephone Encounter (Signed)
Left message

## 2016-09-05 NOTE — Telephone Encounter (Signed)
Garrett,Donna pt's daughter-in-law notified she states that she will keep Dr Allyson Sabal appt in May

## 2016-09-07 DIAGNOSIS — I5033 Acute on chronic diastolic (congestive) heart failure: Secondary | ICD-10-CM | POA: Diagnosis not present

## 2016-09-07 DIAGNOSIS — M6281 Muscle weakness (generalized): Secondary | ICD-10-CM | POA: Diagnosis not present

## 2016-09-07 DIAGNOSIS — Z7901 Long term (current) use of anticoagulants: Secondary | ICD-10-CM | POA: Diagnosis not present

## 2016-09-07 DIAGNOSIS — E1122 Type 2 diabetes mellitus with diabetic chronic kidney disease: Secondary | ICD-10-CM | POA: Diagnosis not present

## 2016-09-07 DIAGNOSIS — R2689 Other abnormalities of gait and mobility: Secondary | ICD-10-CM | POA: Diagnosis not present

## 2016-09-07 DIAGNOSIS — N183 Chronic kidney disease, stage 3 (moderate): Secondary | ICD-10-CM | POA: Diagnosis not present

## 2016-09-07 DIAGNOSIS — I130 Hypertensive heart and chronic kidney disease with heart failure and stage 1 through stage 4 chronic kidney disease, or unspecified chronic kidney disease: Secondary | ICD-10-CM | POA: Diagnosis not present

## 2016-09-10 DIAGNOSIS — E1122 Type 2 diabetes mellitus with diabetic chronic kidney disease: Secondary | ICD-10-CM | POA: Diagnosis not present

## 2016-09-10 DIAGNOSIS — R2689 Other abnormalities of gait and mobility: Secondary | ICD-10-CM | POA: Diagnosis not present

## 2016-09-10 DIAGNOSIS — Z7901 Long term (current) use of anticoagulants: Secondary | ICD-10-CM | POA: Diagnosis not present

## 2016-09-10 DIAGNOSIS — M6281 Muscle weakness (generalized): Secondary | ICD-10-CM | POA: Diagnosis not present

## 2016-09-10 DIAGNOSIS — I130 Hypertensive heart and chronic kidney disease with heart failure and stage 1 through stage 4 chronic kidney disease, or unspecified chronic kidney disease: Secondary | ICD-10-CM | POA: Diagnosis not present

## 2016-09-10 DIAGNOSIS — I5033 Acute on chronic diastolic (congestive) heart failure: Secondary | ICD-10-CM | POA: Diagnosis not present

## 2016-09-10 DIAGNOSIS — N183 Chronic kidney disease, stage 3 (moderate): Secondary | ICD-10-CM | POA: Diagnosis not present

## 2016-09-12 DIAGNOSIS — E1122 Type 2 diabetes mellitus with diabetic chronic kidney disease: Secondary | ICD-10-CM | POA: Diagnosis not present

## 2016-09-12 DIAGNOSIS — M6281 Muscle weakness (generalized): Secondary | ICD-10-CM | POA: Diagnosis not present

## 2016-09-12 DIAGNOSIS — R2689 Other abnormalities of gait and mobility: Secondary | ICD-10-CM | POA: Diagnosis not present

## 2016-09-12 DIAGNOSIS — I5033 Acute on chronic diastolic (congestive) heart failure: Secondary | ICD-10-CM | POA: Diagnosis not present

## 2016-09-12 DIAGNOSIS — N183 Chronic kidney disease, stage 3 (moderate): Secondary | ICD-10-CM | POA: Diagnosis not present

## 2016-09-12 DIAGNOSIS — I130 Hypertensive heart and chronic kidney disease with heart failure and stage 1 through stage 4 chronic kidney disease, or unspecified chronic kidney disease: Secondary | ICD-10-CM | POA: Diagnosis not present

## 2016-09-14 DIAGNOSIS — E1122 Type 2 diabetes mellitus with diabetic chronic kidney disease: Secondary | ICD-10-CM | POA: Diagnosis not present

## 2016-09-14 DIAGNOSIS — R2689 Other abnormalities of gait and mobility: Secondary | ICD-10-CM | POA: Diagnosis not present

## 2016-09-14 DIAGNOSIS — N189 Chronic kidney disease, unspecified: Secondary | ICD-10-CM | POA: Diagnosis not present

## 2016-09-14 DIAGNOSIS — I130 Hypertensive heart and chronic kidney disease with heart failure and stage 1 through stage 4 chronic kidney disease, or unspecified chronic kidney disease: Secondary | ICD-10-CM | POA: Diagnosis not present

## 2016-09-14 DIAGNOSIS — I5033 Acute on chronic diastolic (congestive) heart failure: Secondary | ICD-10-CM | POA: Diagnosis not present

## 2016-09-14 DIAGNOSIS — N183 Chronic kidney disease, stage 3 (moderate): Secondary | ICD-10-CM | POA: Diagnosis not present

## 2016-09-14 DIAGNOSIS — M6281 Muscle weakness (generalized): Secondary | ICD-10-CM | POA: Diagnosis not present

## 2016-09-17 DIAGNOSIS — M6281 Muscle weakness (generalized): Secondary | ICD-10-CM | POA: Diagnosis not present

## 2016-09-17 DIAGNOSIS — R2689 Other abnormalities of gait and mobility: Secondary | ICD-10-CM | POA: Diagnosis not present

## 2016-09-17 DIAGNOSIS — E1122 Type 2 diabetes mellitus with diabetic chronic kidney disease: Secondary | ICD-10-CM | POA: Diagnosis not present

## 2016-09-17 DIAGNOSIS — I130 Hypertensive heart and chronic kidney disease with heart failure and stage 1 through stage 4 chronic kidney disease, or unspecified chronic kidney disease: Secondary | ICD-10-CM | POA: Diagnosis not present

## 2016-09-17 DIAGNOSIS — I5033 Acute on chronic diastolic (congestive) heart failure: Secondary | ICD-10-CM | POA: Diagnosis not present

## 2016-09-17 DIAGNOSIS — N183 Chronic kidney disease, stage 3 (moderate): Secondary | ICD-10-CM | POA: Diagnosis not present

## 2016-09-18 DIAGNOSIS — I5033 Acute on chronic diastolic (congestive) heart failure: Secondary | ICD-10-CM | POA: Diagnosis not present

## 2016-09-18 DIAGNOSIS — I130 Hypertensive heart and chronic kidney disease with heart failure and stage 1 through stage 4 chronic kidney disease, or unspecified chronic kidney disease: Secondary | ICD-10-CM | POA: Diagnosis not present

## 2016-09-18 DIAGNOSIS — E1122 Type 2 diabetes mellitus with diabetic chronic kidney disease: Secondary | ICD-10-CM | POA: Diagnosis not present

## 2016-09-18 DIAGNOSIS — Z7901 Long term (current) use of anticoagulants: Secondary | ICD-10-CM | POA: Diagnosis not present

## 2016-09-18 DIAGNOSIS — R2689 Other abnormalities of gait and mobility: Secondary | ICD-10-CM | POA: Diagnosis not present

## 2016-09-18 DIAGNOSIS — M6281 Muscle weakness (generalized): Secondary | ICD-10-CM | POA: Diagnosis not present

## 2016-09-18 DIAGNOSIS — N183 Chronic kidney disease, stage 3 (moderate): Secondary | ICD-10-CM | POA: Diagnosis not present

## 2016-09-19 ENCOUNTER — Encounter: Payer: Self-pay | Admitting: Cardiovascular Disease

## 2016-09-19 ENCOUNTER — Ambulatory Visit (INDEPENDENT_AMBULATORY_CARE_PROVIDER_SITE_OTHER): Payer: Medicare Other | Admitting: Cardiovascular Disease

## 2016-09-19 VITALS — BP 176/68 | HR 71 | Ht 67.0 in | Wt 248.0 lb

## 2016-09-19 DIAGNOSIS — I482 Chronic atrial fibrillation, unspecified: Secondary | ICD-10-CM

## 2016-09-19 DIAGNOSIS — E7800 Pure hypercholesterolemia, unspecified: Secondary | ICD-10-CM

## 2016-09-19 DIAGNOSIS — I6523 Occlusion and stenosis of bilateral carotid arteries: Secondary | ICD-10-CM | POA: Diagnosis not present

## 2016-09-19 DIAGNOSIS — I10 Essential (primary) hypertension: Secondary | ICD-10-CM | POA: Diagnosis not present

## 2016-09-19 DIAGNOSIS — I2510 Atherosclerotic heart disease of native coronary artery without angina pectoris: Secondary | ICD-10-CM | POA: Diagnosis not present

## 2016-09-19 NOTE — Assessment & Plan Note (Signed)
History of hyperlipidemia on statin therapy with recent lipid profile performed 08/07/16 related to postural 79, LDL 22 and HDL 26.

## 2016-09-19 NOTE — Assessment & Plan Note (Addendum)
History of essential hypertension blood pressure measured at 176/68 with recheck at 154/80. He is on hydralazine. Continue current meds at current dosing.

## 2016-09-19 NOTE — Assessment & Plan Note (Signed)
Chronic atrial fibrillation rate controlled on Coumadin anticoagulation followed by his PCP

## 2016-09-19 NOTE — Patient Instructions (Signed)
Medication Instructions: Your physician recommends that you continue on your current medications as directed. Please refer to the Current Medication list given to you today.   Follow-Up: We request that you follow-up in: 3 months with Theodore Demarkhonda Barrett, PA-C and in 6 months with Dr San MorelleBerry  You will receive a reminder letter in the mail two months in advance. If you don't receive a letter, please call our office to schedule the follow-up appointment.  If you need a refill on your cardiac medications before your next appointment, please call your pharmacy.

## 2016-09-19 NOTE — Progress Notes (Signed)
09/19/2016 Isaiah [name]   1940/11/22  [id number]  Primary Physician [place], [name], MD Primary Cardiologist: Isaiah Gess MD [facility] [name]  HPI:  [name] is a 76 year old moderately overweight divorced Caucasian male father of 5 children, grandfather of over 20 grandchildren who is accompanied by his granddaughter Isaiah [name] today.Marland Kitchen He was formerly a patient of Dr. Kandis [name]. i last saw him in the office 09/06/15.Marland Kitchen His primary healthcare provider is Sarah [place] at the [place] [name] in [name]. He has a long complicated past medical history. His risk factors include remote tobacco abuse having quit 20 years ago, diabetes, hypertension, hyperlipidemia. He had an anterior wall myocardial infarction back in 1994 treated with IV streptokinase, and angioplasty. Several years later he had stenting of his LAD which ultimately developed "in-stent restenosis requiring multivessel coronary artery bypass grafting in 1997. He has not required angiography since. His last Myoview performed using was nonischemic. His other problems include peripheral vascular disease with a known occluded right external iliac artery, remote history of left to right femorofemoral crossover grafting by Dr. Marcy [name]. This ultimately became infected and was removed by Dr. Cari [name] with patch angioplasty. He denies claudication. He has had a stroke in the past and has a right internal carotid artery stenosis by CT angiography which has been followed by 2 puffs ultrasound as well. His other problems include chronic atrial fibrillation on Coumadin anticoagulation, obstructive sleep apnea on C Pap, and venous insufficiency.  Since I saw him a little over a year ago, he was hospitalized 08/05/16 through the 29th. He had a non-STEMI. Intracardiac catheterization by [name] 3/27 revealing patent grafts with a high-grade proximal dominant RCA lesion which was stented with a drug-eluting stent. He was readmitted  08/21/16 with hyponatremia and hypotension. He also was positive for fecal blood and his aspirin was discontinued. His medications were adjusted. He is currently being seen by home health. He feels clinically improved.  Current Outpatient Prescriptions  Medication Sig Dispense Refill  . allopurinol (ZYLOPRIM) 100 MG tablet TAKE 2 TABLETS BY MOUTH DAILY. (Patient taking differently: TAKE 1 TABLET BY MOUTH TWICE DAILY) 30 tablet 0  . clopidogrel (PLAVIX) 75 MG tablet Take 1 tablet (75 mg total) by mouth daily with breakfast. 30 tablet 0  . ferrous sulfate 325 (65 FE) MG tablet Take 1 tablet (325 mg total) by mouth 2 (two) times daily with a meal. 90 tablet 0  . furosemide (LASIX) 40 MG tablet Take 1 tablet (40 mg total) by mouth 2 (two) times daily. 90 tablet 0  . gabapentin (NEURONTIN) 100 MG capsule Take 100 mg by mouth 3 (three) times daily.     Marland Kitchen glipiZIDE (GLUCOTROL) 5 MG tablet Take 5 mg by mouth 2 (two) times daily before a meal.    . hydrALAZINE (APRESOLINE) 100 MG tablet Take 100 mg by mouth 2 (two) times daily.    . insulin aspart protamine- aspart (NOVOLOG MIX 70/30) (70-30) 100 UNIT/ML injection Inject 100 Units into the skin 3 (three) times daily before meals.    . isosorbide mononitrate (IMDUR) 60 MG 24 hr tablet Take 1 tablet (60 mg total) by mouth daily. Appointment needed for future refills 30 tablet 0  . Multiple Vitamin (MULTIVITAMIN WITH MINERALS) TABS tablet Take 1 tablet by mouth daily.    . pantoprazole (PROTONIX) 40 MG tablet Take 40 mg by mouth 2 (two) times daily before a meal.     . potassium chloride (K-DUR) 10 MEQ  tablet Take 1 tablet (10 mEq total) by mouth daily. 30 tablet 5  . pravastatin (PRAVACHOL) 40 MG tablet Take 40 mg by mouth at bedtime.     Marland Kitchen. PRESCRIPTION MEDICATION Inhale into the lungs at bedtime. CPAP    . Psyllium (METAMUCIL PO) Take 2 capsules by mouth at bedtime.    Marland Kitchen. QUEtiapine (SEROQUEL) 50 MG tablet Take 50 mg by mouth daily with supper.    . traMADol  (ULTRAM) 50 MG tablet Take 50-100 mg by mouth 3 (three) times daily as needed (pain).     Marland Kitchen. warfarin (COUMADIN) 4 MG tablet Take 4 mg by mouth daily with supper.     No current facility-administered medications for this visit.     No Known Allergies  Social History   Social History  . Marital status: Divorced    Spouse name: N/A  . Number of children: N/A  . Years of education: N/A   Occupational History  . Not on file.   Social History Main Topics  . Smoking status: Former Smoker    Types: Cigarettes    Quit date: 05/14/1978  . Smokeless tobacco: Never Used  . Alcohol use No  . Drug use: No  . Sexual activity: Not on file   Other Topics Concern  . Not on file   Social History Narrative  . No narrative on file     Review of Systems: General: negative for chills, fever, night sweats or weight changes.  Cardiovascular: negative for chest pain, dyspnea on exertion, edema, orthopnea, palpitations, paroxysmal nocturnal dyspnea or shortness of breath Dermatological: negative for rash Respiratory: negative for cough or wheezing Urologic: negative for hematuria Abdominal: negative for nausea, vomiting, diarrhea, bright red blood per rectum, melena, or hematemesis Neurologic: negative for visual changes, syncope, or dizziness All other systems reviewed and are otherwise negative except as noted above.    Blood pressure (!) 176/68, pulse 71, height 5\' 7"  (1.702 m), weight 248 lb (112.5 kg).  General appearance: alert and no distress Neck: no adenopathy, no carotid bruit, no JVD, supple, symmetrical, trachea midline and thyroid not enlarged, symmetric, no tenderness/mass/nodules Lungs: clear to auscultation bilaterally Heart: regular rate and rhythm, S1, S2 normal, no murmur, click, rub or gallop Extremities: extremities normal, atraumatic, no cyanosis or edema  EKG atrial fibrillation with a ventricular response of 71, nonspecific IVCD with anterolateral myocardial  infarction. I personally reviewed this EKG  ASSESSMENT AND PLAN:   Coronary artery disease History of coronary artery disease status post anterior wall mitral infarction back in 1994 treated with streptokinase and angioplasty. He openly had coronary artery bypass graft in 1997. He was admitted with unstable angina in March and underwent catheterization by Dr. Eldridge DaceVaranasi 08/07/16 revealing a patent LIMA to the LAD, patent vein to marginal branch, diagonal branch and 80% proximal dominant RCA which was stented using a science 4 mm x 23 mm long drug-eluting stent. He is on aspirin and Plavix. He denies chest pain or shortness of breath. He recently was noted to be somewhat anemic and a fecal occult blood test was positive. He also is on Coumadin for atrial fibrillation. Aspirin was discontinued.  Chronic atrial fibrillation (HCC) Chronic atrial fibrillation rate controlled on Coumadin anticoagulation followed by his PCP  Essential hypertension History of essential hypertension blood pressure measured at 176/68 with recheck at 154/80. He is on hydralazine. Continue current meds at current dosing.  Hyperlipidemia History of hyperlipidemia on statin therapy with recent lipid profile performed 08/07/16 related to postural  79, LDL 22 and HDL 26.      Isaiah Gess MD [name], [name] 09/19/2016 10:41 AM

## 2016-09-19 NOTE — Assessment & Plan Note (Signed)
History of coronary artery disease status post anterior wall mitral infarction back in 1994 treated with streptokinase and angioplasty. He openly had coronary artery bypass graft in 1997. He was admitted with unstable angina in March and underwent catheterization by Dr. Eldridge DaceVaranasi 08/07/16 revealing a patent LIMA to the LAD, patent vein to marginal branch, diagonal branch and 80% proximal dominant RCA which was stented using a science 4 mm x 23 mm long drug-eluting stent. He is on aspirin and Plavix. He denies chest pain or shortness of breath. He recently was noted to be somewhat anemic and a fecal occult blood test was positive. He also is on Coumadin for atrial fibrillation. Aspirin was discontinued.

## 2016-09-24 DIAGNOSIS — M6281 Muscle weakness (generalized): Secondary | ICD-10-CM | POA: Diagnosis not present

## 2016-09-24 DIAGNOSIS — N183 Chronic kidney disease, stage 3 (moderate): Secondary | ICD-10-CM | POA: Diagnosis not present

## 2016-09-24 DIAGNOSIS — R2689 Other abnormalities of gait and mobility: Secondary | ICD-10-CM | POA: Diagnosis not present

## 2016-09-24 DIAGNOSIS — I5033 Acute on chronic diastolic (congestive) heart failure: Secondary | ICD-10-CM | POA: Diagnosis not present

## 2016-09-24 DIAGNOSIS — Z7901 Long term (current) use of anticoagulants: Secondary | ICD-10-CM | POA: Diagnosis not present

## 2016-09-24 DIAGNOSIS — I130 Hypertensive heart and chronic kidney disease with heart failure and stage 1 through stage 4 chronic kidney disease, or unspecified chronic kidney disease: Secondary | ICD-10-CM | POA: Diagnosis not present

## 2016-09-24 DIAGNOSIS — E1122 Type 2 diabetes mellitus with diabetic chronic kidney disease: Secondary | ICD-10-CM | POA: Diagnosis not present

## 2016-10-01 DIAGNOSIS — M6281 Muscle weakness (generalized): Secondary | ICD-10-CM | POA: Diagnosis not present

## 2016-10-01 DIAGNOSIS — N183 Chronic kidney disease, stage 3 (moderate): Secondary | ICD-10-CM | POA: Diagnosis not present

## 2016-10-01 DIAGNOSIS — Z7901 Long term (current) use of anticoagulants: Secondary | ICD-10-CM | POA: Diagnosis not present

## 2016-10-01 DIAGNOSIS — I5033 Acute on chronic diastolic (congestive) heart failure: Secondary | ICD-10-CM | POA: Diagnosis not present

## 2016-10-01 DIAGNOSIS — I130 Hypertensive heart and chronic kidney disease with heart failure and stage 1 through stage 4 chronic kidney disease, or unspecified chronic kidney disease: Secondary | ICD-10-CM | POA: Diagnosis not present

## 2016-10-01 DIAGNOSIS — E1122 Type 2 diabetes mellitus with diabetic chronic kidney disease: Secondary | ICD-10-CM | POA: Diagnosis not present

## 2016-10-01 DIAGNOSIS — R2689 Other abnormalities of gait and mobility: Secondary | ICD-10-CM | POA: Diagnosis not present

## 2016-10-11 DIAGNOSIS — I4891 Unspecified atrial fibrillation: Secondary | ICD-10-CM | POA: Diagnosis not present

## 2016-10-11 DIAGNOSIS — Z7901 Long term (current) use of anticoagulants: Secondary | ICD-10-CM | POA: Diagnosis not present

## 2016-10-13 NOTE — Addendum Note (Signed)
Addendum  created 10/13/16 0800 by Keriana Sarsfield, MD   Sign clinical note    

## 2016-10-30 DIAGNOSIS — Z7901 Long term (current) use of anticoagulants: Secondary | ICD-10-CM | POA: Diagnosis not present

## 2016-10-30 DIAGNOSIS — I4891 Unspecified atrial fibrillation: Secondary | ICD-10-CM | POA: Diagnosis not present

## 2016-11-09 DIAGNOSIS — Z7901 Long term (current) use of anticoagulants: Secondary | ICD-10-CM | POA: Diagnosis not present

## 2016-11-26 DIAGNOSIS — I10 Essential (primary) hypertension: Secondary | ICD-10-CM | POA: Diagnosis not present

## 2016-11-26 DIAGNOSIS — Z7901 Long term (current) use of anticoagulants: Secondary | ICD-10-CM | POA: Diagnosis not present

## 2016-11-26 DIAGNOSIS — E119 Type 2 diabetes mellitus without complications: Secondary | ICD-10-CM | POA: Diagnosis not present

## 2016-11-26 DIAGNOSIS — N184 Chronic kidney disease, stage 4 (severe): Secondary | ICD-10-CM | POA: Diagnosis not present

## 2016-11-26 DIAGNOSIS — I509 Heart failure, unspecified: Secondary | ICD-10-CM | POA: Diagnosis not present

## 2016-12-24 DIAGNOSIS — Z7901 Long term (current) use of anticoagulants: Secondary | ICD-10-CM | POA: Diagnosis not present

## 2017-01-21 DIAGNOSIS — Z7901 Long term (current) use of anticoagulants: Secondary | ICD-10-CM | POA: Diagnosis not present

## 2017-02-18 DIAGNOSIS — Z7901 Long term (current) use of anticoagulants: Secondary | ICD-10-CM | POA: Diagnosis not present

## 2017-03-06 ENCOUNTER — Encounter (HOSPITAL_COMMUNITY): Payer: Medicare Other

## 2017-03-06 ENCOUNTER — Ambulatory Visit: Payer: Medicare Other | Admitting: Family

## 2017-03-22 DIAGNOSIS — Z7901 Long term (current) use of anticoagulants: Secondary | ICD-10-CM | POA: Diagnosis not present

## 2017-03-26 DIAGNOSIS — I10 Essential (primary) hypertension: Secondary | ICD-10-CM | POA: Diagnosis not present

## 2017-03-26 DIAGNOSIS — I4891 Unspecified atrial fibrillation: Secondary | ICD-10-CM | POA: Diagnosis not present

## 2017-03-26 DIAGNOSIS — E876 Hypokalemia: Secondary | ICD-10-CM | POA: Diagnosis not present

## 2017-03-26 DIAGNOSIS — E119 Type 2 diabetes mellitus without complications: Secondary | ICD-10-CM | POA: Diagnosis not present

## 2017-03-26 DIAGNOSIS — G2581 Restless legs syndrome: Secondary | ICD-10-CM | POA: Diagnosis not present

## 2017-03-26 DIAGNOSIS — N184 Chronic kidney disease, stage 4 (severe): Secondary | ICD-10-CM | POA: Diagnosis not present

## 2017-03-26 DIAGNOSIS — F518 Other sleep disorders not due to a substance or known physiological condition: Secondary | ICD-10-CM | POA: Diagnosis not present

## 2017-03-26 DIAGNOSIS — Z7901 Long term (current) use of anticoagulants: Secondary | ICD-10-CM | POA: Diagnosis not present

## 2017-03-26 DIAGNOSIS — I509 Heart failure, unspecified: Secondary | ICD-10-CM | POA: Diagnosis not present

## 2017-03-26 DIAGNOSIS — M542 Cervicalgia: Secondary | ICD-10-CM | POA: Diagnosis not present

## 2017-04-02 DIAGNOSIS — N184 Chronic kidney disease, stage 4 (severe): Secondary | ICD-10-CM | POA: Diagnosis not present

## 2017-04-02 DIAGNOSIS — F518 Other sleep disorders not due to a substance or known physiological condition: Secondary | ICD-10-CM | POA: Diagnosis not present

## 2017-04-02 DIAGNOSIS — G2581 Restless legs syndrome: Secondary | ICD-10-CM | POA: Diagnosis not present

## 2017-04-02 DIAGNOSIS — Z7901 Long term (current) use of anticoagulants: Secondary | ICD-10-CM | POA: Diagnosis not present

## 2017-04-02 DIAGNOSIS — I10 Essential (primary) hypertension: Secondary | ICD-10-CM | POA: Diagnosis not present

## 2017-04-02 DIAGNOSIS — I509 Heart failure, unspecified: Secondary | ICD-10-CM | POA: Diagnosis not present

## 2017-04-02 DIAGNOSIS — M542 Cervicalgia: Secondary | ICD-10-CM | POA: Diagnosis not present

## 2017-04-02 DIAGNOSIS — I4891 Unspecified atrial fibrillation: Secondary | ICD-10-CM | POA: Diagnosis not present

## 2017-04-02 DIAGNOSIS — E119 Type 2 diabetes mellitus without complications: Secondary | ICD-10-CM | POA: Diagnosis not present

## 2017-04-02 DIAGNOSIS — E876 Hypokalemia: Secondary | ICD-10-CM | POA: Diagnosis not present

## 2017-04-29 DIAGNOSIS — Z7901 Long term (current) use of anticoagulants: Secondary | ICD-10-CM | POA: Diagnosis not present

## 2017-05-27 DIAGNOSIS — Z7901 Long term (current) use of anticoagulants: Secondary | ICD-10-CM | POA: Diagnosis not present

## 2017-06-24 DIAGNOSIS — Z7901 Long term (current) use of anticoagulants: Secondary | ICD-10-CM | POA: Diagnosis not present

## 2017-07-01 DIAGNOSIS — F518 Other sleep disorders not due to a substance or known physiological condition: Secondary | ICD-10-CM | POA: Diagnosis not present

## 2017-07-01 DIAGNOSIS — I509 Heart failure, unspecified: Secondary | ICD-10-CM | POA: Diagnosis not present

## 2017-07-01 DIAGNOSIS — E119 Type 2 diabetes mellitus without complications: Secondary | ICD-10-CM | POA: Diagnosis not present

## 2017-07-01 DIAGNOSIS — Z7901 Long term (current) use of anticoagulants: Secondary | ICD-10-CM | POA: Diagnosis not present

## 2017-07-01 DIAGNOSIS — I10 Essential (primary) hypertension: Secondary | ICD-10-CM | POA: Diagnosis not present

## 2017-07-01 DIAGNOSIS — I4891 Unspecified atrial fibrillation: Secondary | ICD-10-CM | POA: Diagnosis not present

## 2017-07-03 DIAGNOSIS — H2513 Age-related nuclear cataract, bilateral: Secondary | ICD-10-CM | POA: Diagnosis not present

## 2017-07-08 DIAGNOSIS — H2512 Age-related nuclear cataract, left eye: Secondary | ICD-10-CM | POA: Diagnosis not present

## 2017-07-08 DIAGNOSIS — Z01818 Encounter for other preprocedural examination: Secondary | ICD-10-CM | POA: Diagnosis not present

## 2017-07-08 DIAGNOSIS — H25812 Combined forms of age-related cataract, left eye: Secondary | ICD-10-CM | POA: Diagnosis not present

## 2017-07-22 DIAGNOSIS — Z7901 Long term (current) use of anticoagulants: Secondary | ICD-10-CM | POA: Diagnosis not present

## 2017-07-24 DIAGNOSIS — H25811 Combined forms of age-related cataract, right eye: Secondary | ICD-10-CM | POA: Diagnosis not present

## 2017-07-24 DIAGNOSIS — H2511 Age-related nuclear cataract, right eye: Secondary | ICD-10-CM | POA: Diagnosis not present

## 2017-08-13 DIAGNOSIS — R5383 Other fatigue: Secondary | ICD-10-CM | POA: Diagnosis not present

## 2017-08-13 DIAGNOSIS — G4733 Obstructive sleep apnea (adult) (pediatric): Secondary | ICD-10-CM | POA: Diagnosis not present

## 2017-08-13 DIAGNOSIS — R0600 Dyspnea, unspecified: Secondary | ICD-10-CM | POA: Diagnosis not present

## 2017-08-19 DIAGNOSIS — Z7901 Long term (current) use of anticoagulants: Secondary | ICD-10-CM | POA: Diagnosis not present

## 2017-09-10 DIAGNOSIS — G4733 Obstructive sleep apnea (adult) (pediatric): Secondary | ICD-10-CM | POA: Diagnosis not present

## 2017-09-16 DIAGNOSIS — Z7901 Long term (current) use of anticoagulants: Secondary | ICD-10-CM | POA: Diagnosis not present

## 2017-09-17 DIAGNOSIS — R0600 Dyspnea, unspecified: Secondary | ICD-10-CM | POA: Diagnosis not present

## 2017-09-17 DIAGNOSIS — R5383 Other fatigue: Secondary | ICD-10-CM | POA: Diagnosis not present

## 2017-09-17 DIAGNOSIS — G4733 Obstructive sleep apnea (adult) (pediatric): Secondary | ICD-10-CM | POA: Diagnosis not present

## 2017-10-10 DIAGNOSIS — N189 Chronic kidney disease, unspecified: Secondary | ICD-10-CM | POA: Diagnosis not present

## 2017-10-10 DIAGNOSIS — E119 Type 2 diabetes mellitus without complications: Secondary | ICD-10-CM | POA: Diagnosis not present

## 2017-10-10 DIAGNOSIS — R42 Dizziness and giddiness: Secondary | ICD-10-CM | POA: Diagnosis not present

## 2017-10-10 DIAGNOSIS — J8410 Pulmonary fibrosis, unspecified: Secondary | ICD-10-CM | POA: Diagnosis not present

## 2017-10-10 DIAGNOSIS — I5020 Unspecified systolic (congestive) heart failure: Secondary | ICD-10-CM | POA: Diagnosis not present

## 2017-10-10 DIAGNOSIS — D649 Anemia, unspecified: Secondary | ICD-10-CM | POA: Diagnosis not present

## 2017-10-10 DIAGNOSIS — N179 Acute kidney failure, unspecified: Secondary | ICD-10-CM | POA: Diagnosis not present

## 2017-10-10 DIAGNOSIS — R4182 Altered mental status, unspecified: Secondary | ICD-10-CM | POA: Diagnosis not present

## 2017-10-10 DIAGNOSIS — K922 Gastrointestinal hemorrhage, unspecified: Secondary | ICD-10-CM | POA: Diagnosis not present

## 2017-10-10 DIAGNOSIS — I129 Hypertensive chronic kidney disease with stage 1 through stage 4 chronic kidney disease, or unspecified chronic kidney disease: Secondary | ICD-10-CM | POA: Diagnosis not present

## 2017-10-11 DIAGNOSIS — I5032 Chronic diastolic (congestive) heart failure: Secondary | ICD-10-CM | POA: Diagnosis not present

## 2017-10-11 DIAGNOSIS — E119 Type 2 diabetes mellitus without complications: Secondary | ICD-10-CM | POA: Diagnosis not present

## 2017-10-11 DIAGNOSIS — E875 Hyperkalemia: Secondary | ICD-10-CM | POA: Diagnosis present

## 2017-10-11 DIAGNOSIS — R410 Disorientation, unspecified: Secondary | ICD-10-CM | POA: Diagnosis not present

## 2017-10-11 DIAGNOSIS — K5900 Constipation, unspecified: Secondary | ICD-10-CM | POA: Diagnosis present

## 2017-10-11 DIAGNOSIS — I110 Hypertensive heart disease with heart failure: Secondary | ICD-10-CM | POA: Diagnosis not present

## 2017-10-11 DIAGNOSIS — R5383 Other fatigue: Secondary | ICD-10-CM | POA: Diagnosis not present

## 2017-10-11 DIAGNOSIS — Z7409 Other reduced mobility: Secondary | ICD-10-CM | POA: Diagnosis not present

## 2017-10-11 DIAGNOSIS — R0602 Shortness of breath: Secondary | ICD-10-CM | POA: Diagnosis not present

## 2017-10-11 DIAGNOSIS — D539 Nutritional anemia, unspecified: Secondary | ICD-10-CM | POA: Diagnosis present

## 2017-10-11 DIAGNOSIS — K922 Gastrointestinal hemorrhage, unspecified: Secondary | ICD-10-CM | POA: Diagnosis not present

## 2017-10-11 DIAGNOSIS — M109 Gout, unspecified: Secondary | ICD-10-CM | POA: Diagnosis not present

## 2017-10-11 DIAGNOSIS — D62 Acute posthemorrhagic anemia: Secondary | ICD-10-CM | POA: Diagnosis not present

## 2017-10-11 DIAGNOSIS — Z7901 Long term (current) use of anticoagulants: Secondary | ICD-10-CM | POA: Diagnosis not present

## 2017-10-11 DIAGNOSIS — Z951 Presence of aortocoronary bypass graft: Secondary | ICD-10-CM | POA: Diagnosis not present

## 2017-10-11 DIAGNOSIS — Z743 Need for continuous supervision: Secondary | ICD-10-CM | POA: Diagnosis not present

## 2017-10-11 DIAGNOSIS — I2510 Atherosclerotic heart disease of native coronary artery without angina pectoris: Secondary | ICD-10-CM | POA: Diagnosis not present

## 2017-10-11 DIAGNOSIS — K921 Melena: Secondary | ICD-10-CM | POA: Diagnosis not present

## 2017-10-11 DIAGNOSIS — Z86718 Personal history of other venous thrombosis and embolism: Secondary | ICD-10-CM | POA: Diagnosis not present

## 2017-10-11 DIAGNOSIS — D649 Anemia, unspecified: Secondary | ICD-10-CM | POA: Diagnosis not present

## 2017-10-11 DIAGNOSIS — Z794 Long term (current) use of insulin: Secondary | ICD-10-CM | POA: Diagnosis not present

## 2017-10-11 DIAGNOSIS — R279 Unspecified lack of coordination: Secondary | ICD-10-CM | POA: Diagnosis not present

## 2017-10-11 DIAGNOSIS — I482 Chronic atrial fibrillation: Secondary | ICD-10-CM | POA: Diagnosis not present

## 2017-10-12 DIAGNOSIS — M1A9XX0 Chronic gout, unspecified, without tophus (tophi): Secondary | ICD-10-CM | POA: Insufficient documentation

## 2017-10-14 DIAGNOSIS — Z7901 Long term (current) use of anticoagulants: Secondary | ICD-10-CM | POA: Diagnosis not present

## 2017-10-15 DIAGNOSIS — R2689 Other abnormalities of gait and mobility: Secondary | ICD-10-CM | POA: Diagnosis not present

## 2017-10-15 DIAGNOSIS — M6281 Muscle weakness (generalized): Secondary | ICD-10-CM | POA: Diagnosis not present

## 2017-10-15 DIAGNOSIS — I5032 Chronic diastolic (congestive) heart failure: Secondary | ICD-10-CM | POA: Diagnosis not present

## 2017-10-15 DIAGNOSIS — Z7901 Long term (current) use of anticoagulants: Secondary | ICD-10-CM | POA: Diagnosis not present

## 2017-10-15 DIAGNOSIS — I130 Hypertensive heart and chronic kidney disease with heart failure and stage 1 through stage 4 chronic kidney disease, or unspecified chronic kidney disease: Secondary | ICD-10-CM | POA: Diagnosis not present

## 2017-10-15 DIAGNOSIS — M109 Gout, unspecified: Secondary | ICD-10-CM | POA: Diagnosis not present

## 2017-10-15 DIAGNOSIS — E1151 Type 2 diabetes mellitus with diabetic peripheral angiopathy without gangrene: Secondary | ICD-10-CM | POA: Diagnosis not present

## 2017-10-15 DIAGNOSIS — I200 Unstable angina: Secondary | ICD-10-CM | POA: Diagnosis not present

## 2017-10-15 DIAGNOSIS — N183 Chronic kidney disease, stage 3 (moderate): Secondary | ICD-10-CM | POA: Diagnosis not present

## 2017-10-15 DIAGNOSIS — Z9181 History of falling: Secondary | ICD-10-CM | POA: Diagnosis not present

## 2017-10-15 DIAGNOSIS — E1122 Type 2 diabetes mellitus with diabetic chronic kidney disease: Secondary | ICD-10-CM | POA: Diagnosis not present

## 2017-10-15 DIAGNOSIS — I4891 Unspecified atrial fibrillation: Secondary | ICD-10-CM | POA: Diagnosis not present

## 2017-10-15 DIAGNOSIS — Z794 Long term (current) use of insulin: Secondary | ICD-10-CM | POA: Diagnosis not present

## 2017-10-16 DIAGNOSIS — R2689 Other abnormalities of gait and mobility: Secondary | ICD-10-CM | POA: Diagnosis not present

## 2017-10-16 DIAGNOSIS — I5032 Chronic diastolic (congestive) heart failure: Secondary | ICD-10-CM | POA: Diagnosis not present

## 2017-10-16 DIAGNOSIS — I130 Hypertensive heart and chronic kidney disease with heart failure and stage 1 through stage 4 chronic kidney disease, or unspecified chronic kidney disease: Secondary | ICD-10-CM | POA: Diagnosis not present

## 2017-10-16 DIAGNOSIS — N183 Chronic kidney disease, stage 3 (moderate): Secondary | ICD-10-CM | POA: Diagnosis not present

## 2017-10-16 DIAGNOSIS — E1122 Type 2 diabetes mellitus with diabetic chronic kidney disease: Secondary | ICD-10-CM | POA: Diagnosis not present

## 2017-10-16 DIAGNOSIS — M6281 Muscle weakness (generalized): Secondary | ICD-10-CM | POA: Diagnosis not present

## 2017-10-17 DIAGNOSIS — D649 Anemia, unspecified: Secondary | ICD-10-CM | POA: Diagnosis not present

## 2017-10-17 DIAGNOSIS — I509 Heart failure, unspecified: Secondary | ICD-10-CM | POA: Diagnosis not present

## 2017-10-21 DIAGNOSIS — M6281 Muscle weakness (generalized): Secondary | ICD-10-CM | POA: Diagnosis not present

## 2017-10-21 DIAGNOSIS — I130 Hypertensive heart and chronic kidney disease with heart failure and stage 1 through stage 4 chronic kidney disease, or unspecified chronic kidney disease: Secondary | ICD-10-CM | POA: Diagnosis not present

## 2017-10-21 DIAGNOSIS — N183 Chronic kidney disease, stage 3 (moderate): Secondary | ICD-10-CM | POA: Diagnosis not present

## 2017-10-21 DIAGNOSIS — E1122 Type 2 diabetes mellitus with diabetic chronic kidney disease: Secondary | ICD-10-CM | POA: Diagnosis not present

## 2017-10-21 DIAGNOSIS — I5032 Chronic diastolic (congestive) heart failure: Secondary | ICD-10-CM | POA: Diagnosis not present

## 2017-10-21 DIAGNOSIS — R2689 Other abnormalities of gait and mobility: Secondary | ICD-10-CM | POA: Diagnosis not present

## 2017-10-22 DIAGNOSIS — G4733 Obstructive sleep apnea (adult) (pediatric): Secondary | ICD-10-CM | POA: Diagnosis not present

## 2017-10-28 DIAGNOSIS — I5032 Chronic diastolic (congestive) heart failure: Secondary | ICD-10-CM | POA: Diagnosis not present

## 2017-10-28 DIAGNOSIS — N183 Chronic kidney disease, stage 3 (moderate): Secondary | ICD-10-CM | POA: Diagnosis not present

## 2017-10-28 DIAGNOSIS — I130 Hypertensive heart and chronic kidney disease with heart failure and stage 1 through stage 4 chronic kidney disease, or unspecified chronic kidney disease: Secondary | ICD-10-CM | POA: Diagnosis not present

## 2017-10-28 DIAGNOSIS — R2689 Other abnormalities of gait and mobility: Secondary | ICD-10-CM | POA: Diagnosis not present

## 2017-10-28 DIAGNOSIS — M6281 Muscle weakness (generalized): Secondary | ICD-10-CM | POA: Diagnosis not present

## 2017-10-28 DIAGNOSIS — E1122 Type 2 diabetes mellitus with diabetic chronic kidney disease: Secondary | ICD-10-CM | POA: Diagnosis not present

## 2017-10-30 DIAGNOSIS — E1122 Type 2 diabetes mellitus with diabetic chronic kidney disease: Secondary | ICD-10-CM | POA: Diagnosis not present

## 2017-10-30 DIAGNOSIS — R2689 Other abnormalities of gait and mobility: Secondary | ICD-10-CM | POA: Diagnosis not present

## 2017-10-30 DIAGNOSIS — I130 Hypertensive heart and chronic kidney disease with heart failure and stage 1 through stage 4 chronic kidney disease, or unspecified chronic kidney disease: Secondary | ICD-10-CM | POA: Diagnosis not present

## 2017-10-30 DIAGNOSIS — I5032 Chronic diastolic (congestive) heart failure: Secondary | ICD-10-CM | POA: Diagnosis not present

## 2017-10-30 DIAGNOSIS — M6281 Muscle weakness (generalized): Secondary | ICD-10-CM | POA: Diagnosis not present

## 2017-10-30 DIAGNOSIS — N183 Chronic kidney disease, stage 3 (moderate): Secondary | ICD-10-CM | POA: Diagnosis not present

## 2017-11-04 DIAGNOSIS — M6281 Muscle weakness (generalized): Secondary | ICD-10-CM | POA: Diagnosis not present

## 2017-11-04 DIAGNOSIS — N183 Chronic kidney disease, stage 3 (moderate): Secondary | ICD-10-CM | POA: Diagnosis not present

## 2017-11-04 DIAGNOSIS — I130 Hypertensive heart and chronic kidney disease with heart failure and stage 1 through stage 4 chronic kidney disease, or unspecified chronic kidney disease: Secondary | ICD-10-CM | POA: Diagnosis not present

## 2017-11-04 DIAGNOSIS — I5032 Chronic diastolic (congestive) heart failure: Secondary | ICD-10-CM | POA: Diagnosis not present

## 2017-11-04 DIAGNOSIS — E1122 Type 2 diabetes mellitus with diabetic chronic kidney disease: Secondary | ICD-10-CM | POA: Diagnosis not present

## 2017-11-04 DIAGNOSIS — R2689 Other abnormalities of gait and mobility: Secondary | ICD-10-CM | POA: Diagnosis not present

## 2017-11-06 DIAGNOSIS — I130 Hypertensive heart and chronic kidney disease with heart failure and stage 1 through stage 4 chronic kidney disease, or unspecified chronic kidney disease: Secondary | ICD-10-CM | POA: Diagnosis not present

## 2017-11-06 DIAGNOSIS — I5032 Chronic diastolic (congestive) heart failure: Secondary | ICD-10-CM | POA: Diagnosis not present

## 2017-11-06 DIAGNOSIS — N183 Chronic kidney disease, stage 3 (moderate): Secondary | ICD-10-CM | POA: Diagnosis not present

## 2017-11-06 DIAGNOSIS — M6281 Muscle weakness (generalized): Secondary | ICD-10-CM | POA: Diagnosis not present

## 2017-11-06 DIAGNOSIS — E1122 Type 2 diabetes mellitus with diabetic chronic kidney disease: Secondary | ICD-10-CM | POA: Diagnosis not present

## 2017-11-06 DIAGNOSIS — R2689 Other abnormalities of gait and mobility: Secondary | ICD-10-CM | POA: Diagnosis not present

## 2017-11-11 DIAGNOSIS — Z7901 Long term (current) use of anticoagulants: Secondary | ICD-10-CM | POA: Diagnosis not present

## 2017-11-18 DIAGNOSIS — R0600 Dyspnea, unspecified: Secondary | ICD-10-CM | POA: Diagnosis not present

## 2017-11-18 DIAGNOSIS — R5383 Other fatigue: Secondary | ICD-10-CM | POA: Diagnosis not present

## 2017-11-18 DIAGNOSIS — G4733 Obstructive sleep apnea (adult) (pediatric): Secondary | ICD-10-CM | POA: Diagnosis not present

## 2017-12-09 DIAGNOSIS — Z7901 Long term (current) use of anticoagulants: Secondary | ICD-10-CM | POA: Diagnosis not present

## 2018-01-06 DIAGNOSIS — Z7901 Long term (current) use of anticoagulants: Secondary | ICD-10-CM | POA: Diagnosis not present

## 2018-02-04 DIAGNOSIS — N184 Chronic kidney disease, stage 4 (severe): Secondary | ICD-10-CM | POA: Diagnosis not present

## 2018-02-04 DIAGNOSIS — I10 Essential (primary) hypertension: Secondary | ICD-10-CM | POA: Diagnosis not present

## 2018-02-04 DIAGNOSIS — I509 Heart failure, unspecified: Secondary | ICD-10-CM | POA: Diagnosis not present

## 2018-02-04 DIAGNOSIS — D649 Anemia, unspecified: Secondary | ICD-10-CM | POA: Diagnosis not present

## 2018-02-04 DIAGNOSIS — Z23 Encounter for immunization: Secondary | ICD-10-CM | POA: Diagnosis not present

## 2018-02-04 DIAGNOSIS — E119 Type 2 diabetes mellitus without complications: Secondary | ICD-10-CM | POA: Diagnosis not present

## 2018-02-04 DIAGNOSIS — M79606 Pain in leg, unspecified: Secondary | ICD-10-CM | POA: Diagnosis not present

## 2018-02-04 DIAGNOSIS — Z7901 Long term (current) use of anticoagulants: Secondary | ICD-10-CM | POA: Diagnosis not present

## 2018-03-03 DIAGNOSIS — Z7901 Long term (current) use of anticoagulants: Secondary | ICD-10-CM | POA: Diagnosis not present

## 2018-03-03 DIAGNOSIS — G4733 Obstructive sleep apnea (adult) (pediatric): Secondary | ICD-10-CM | POA: Diagnosis not present

## 2018-03-31 DIAGNOSIS — Z7901 Long term (current) use of anticoagulants: Secondary | ICD-10-CM | POA: Diagnosis not present

## 2018-04-28 DIAGNOSIS — Z7901 Long term (current) use of anticoagulants: Secondary | ICD-10-CM | POA: Diagnosis not present

## 2018-12-09 IMAGING — CR DG CHEST 1V PORT
1 series · 1 of 1 positions shown · non-contrast
Comparison: Radiographs 5 days prior 08/05/2016

CLINICAL DATA: Shortness of breath, weakness.

EXAM:
PORTABLE CHEST 1 VIEW

[AP]
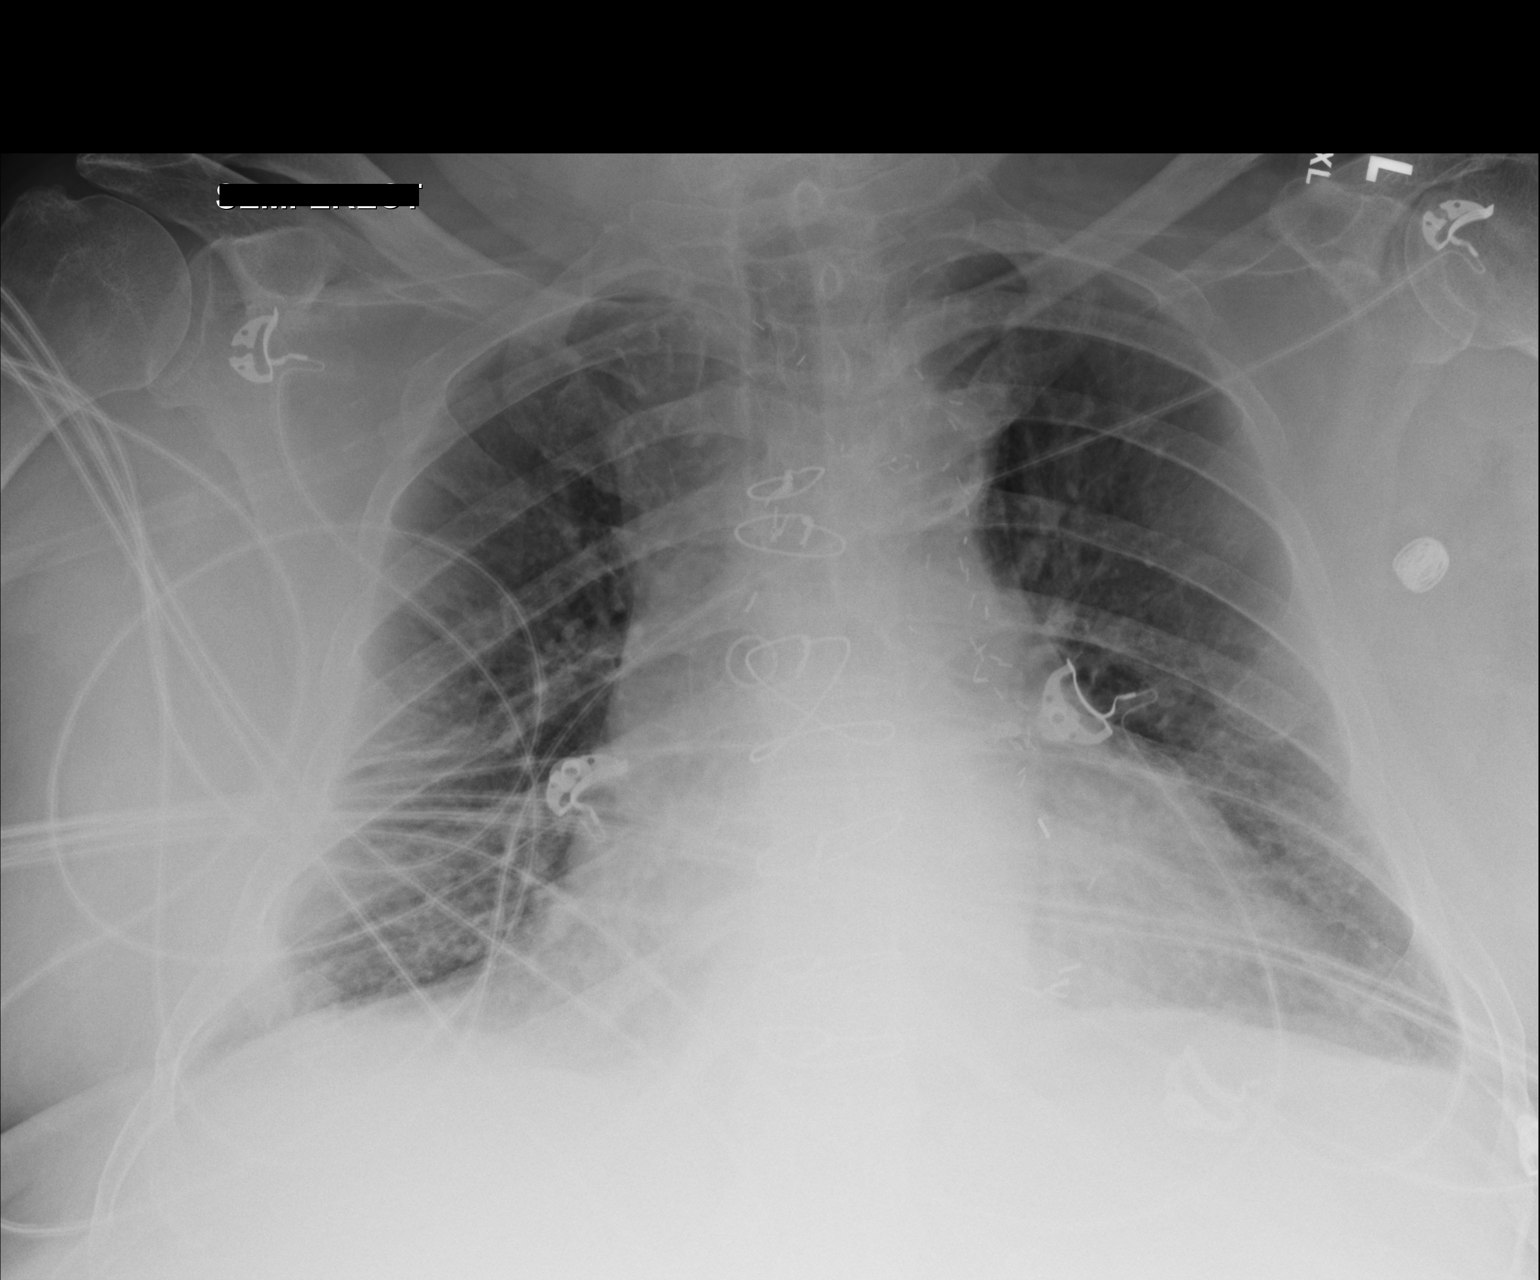

[1 of 1 positions shown; findings below may reference images not displayed]

FINDINGS: Stable cardiomegaly, post median sternotomy and CABG.
Atherosclerosis of the thoracic aorta. Trace chronic blunting of the
costophrenic angles. No pulmonary edema. No focal airspace disease.
No pneumothorax. Multiple overlying monitoring devices project over
the lower chest.
IMPRESSION: Stable cardiomegaly post CABG.  Thoracic aortic atherosclerosis.

No acute abnormality.

## 2018-12-10 DIAGNOSIS — I361 Nonrheumatic tricuspid (valve) insufficiency: Secondary | ICD-10-CM | POA: Diagnosis not present

## 2018-12-10 DIAGNOSIS — R791 Abnormal coagulation profile: Secondary | ICD-10-CM

## 2018-12-10 DIAGNOSIS — R7989 Other specified abnormal findings of blood chemistry: Secondary | ICD-10-CM

## 2018-12-10 DIAGNOSIS — I482 Chronic atrial fibrillation, unspecified: Secondary | ICD-10-CM

## 2018-12-10 DIAGNOSIS — K8020 Calculus of gallbladder without cholecystitis without obstruction: Secondary | ICD-10-CM

## 2018-12-10 DIAGNOSIS — I160 Hypertensive urgency: Secondary | ICD-10-CM

## 2018-12-10 DIAGNOSIS — R109 Unspecified abdominal pain: Secondary | ICD-10-CM | POA: Diagnosis not present

## 2018-12-10 DIAGNOSIS — E1165 Type 2 diabetes mellitus with hyperglycemia: Secondary | ICD-10-CM

## 2018-12-10 DIAGNOSIS — N183 Chronic kidney disease, stage 3 (moderate): Secondary | ICD-10-CM | POA: Diagnosis not present

## 2018-12-10 DIAGNOSIS — S22089A Unspecified fracture of T11-T12 vertebra, initial encounter for closed fracture: Secondary | ICD-10-CM

## 2018-12-11 DIAGNOSIS — I160 Hypertensive urgency: Secondary | ICD-10-CM | POA: Diagnosis not present

## 2018-12-11 DIAGNOSIS — R109 Unspecified abdominal pain: Secondary | ICD-10-CM | POA: Diagnosis not present

## 2018-12-11 DIAGNOSIS — N183 Chronic kidney disease, stage 3 (moderate): Secondary | ICD-10-CM | POA: Diagnosis not present

## 2018-12-11 DIAGNOSIS — K8020 Calculus of gallbladder without cholecystitis without obstruction: Secondary | ICD-10-CM | POA: Diagnosis not present

## 2018-12-12 ENCOUNTER — Inpatient Hospital Stay (HOSPITAL_COMMUNITY)
Admission: AD | Admit: 2018-12-12 | Discharge: 2018-12-16 | DRG: 460 | Disposition: A | Discharge status: Home Health | Payer: Medicare Other | Source: Other Acute Inpatient Hospital | Attending: Neurological Surgery | Admitting: Neurological Surgery

## 2018-12-12 ENCOUNTER — Inpatient Hospital Stay (HOSPITAL_COMMUNITY): Payer: Medicare Other

## 2018-12-12 DIAGNOSIS — M4814 Ankylosing hyperostosis [Forestier], thoracic region: Secondary | ICD-10-CM | POA: Diagnosis present

## 2018-12-12 DIAGNOSIS — E785 Hyperlipidemia, unspecified: Secondary | ICD-10-CM | POA: Diagnosis present

## 2018-12-12 DIAGNOSIS — I110 Hypertensive heart disease with heart failure: Secondary | ICD-10-CM | POA: Diagnosis present

## 2018-12-12 DIAGNOSIS — S22078A Other fracture of T9-T10 vertebra, initial encounter for closed fracture: Secondary | ICD-10-CM | POA: Diagnosis present

## 2018-12-12 DIAGNOSIS — Z8249 Family history of ischemic heart disease and other diseases of the circulatory system: Secondary | ICD-10-CM

## 2018-12-12 DIAGNOSIS — Z20828 Contact with and (suspected) exposure to other viral communicable diseases: Secondary | ICD-10-CM | POA: Diagnosis present

## 2018-12-12 DIAGNOSIS — I509 Heart failure, unspecified: Secondary | ICD-10-CM | POA: Diagnosis present

## 2018-12-12 DIAGNOSIS — E1165 Type 2 diabetes mellitus with hyperglycemia: Secondary | ICD-10-CM | POA: Diagnosis not present

## 2018-12-12 DIAGNOSIS — K8020 Calculus of gallbladder without cholecystitis without obstruction: Secondary | ICD-10-CM | POA: Diagnosis not present

## 2018-12-12 DIAGNOSIS — W19XXXA Unspecified fall, initial encounter: Secondary | ICD-10-CM | POA: Diagnosis present

## 2018-12-12 DIAGNOSIS — Z419 Encounter for procedure for purposes other than remedying health state, unspecified: Secondary | ICD-10-CM

## 2018-12-12 DIAGNOSIS — L8990 Pressure ulcer of unspecified site, unspecified stage: Secondary | ICD-10-CM | POA: Insufficient documentation

## 2018-12-12 DIAGNOSIS — I2510 Atherosclerotic heart disease of native coronary artery without angina pectoris: Secondary | ICD-10-CM | POA: Diagnosis present

## 2018-12-12 DIAGNOSIS — Z833 Family history of diabetes mellitus: Secondary | ICD-10-CM

## 2018-12-12 DIAGNOSIS — I160 Hypertensive urgency: Secondary | ICD-10-CM | POA: Diagnosis not present

## 2018-12-12 DIAGNOSIS — Z87891 Personal history of nicotine dependence: Secondary | ICD-10-CM | POA: Diagnosis not present

## 2018-12-12 DIAGNOSIS — S24103A Unspecified injury at T7-T10 level of thoracic spinal cord, initial encounter: Secondary | ICD-10-CM | POA: Diagnosis present

## 2018-12-12 DIAGNOSIS — Y92009 Unspecified place in unspecified non-institutional (private) residence as the place of occurrence of the external cause: Secondary | ICD-10-CM

## 2018-12-12 DIAGNOSIS — I252 Old myocardial infarction: Secondary | ICD-10-CM

## 2018-12-12 DIAGNOSIS — F05 Delirium due to known physiological condition: Secondary | ICD-10-CM | POA: Diagnosis not present

## 2018-12-12 DIAGNOSIS — N183 Chronic kidney disease, stage 3 (moderate): Secondary | ICD-10-CM | POA: Diagnosis not present

## 2018-12-12 DIAGNOSIS — S22009A Unspecified fracture of unspecified thoracic vertebra, initial encounter for closed fracture: Secondary | ICD-10-CM

## 2018-12-12 DIAGNOSIS — Z8349 Family history of other endocrine, nutritional and metabolic diseases: Secondary | ICD-10-CM | POA: Diagnosis not present

## 2018-12-12 DIAGNOSIS — R109 Unspecified abdominal pain: Secondary | ICD-10-CM | POA: Diagnosis not present

## 2018-12-12 DIAGNOSIS — I480 Paroxysmal atrial fibrillation: Secondary | ICD-10-CM | POA: Diagnosis present

## 2018-12-12 DIAGNOSIS — E1151 Type 2 diabetes mellitus with diabetic peripheral angiopathy without gangrene: Secondary | ICD-10-CM | POA: Diagnosis present

## 2018-12-12 DIAGNOSIS — Z8673 Personal history of transient ischemic attack (TIA), and cerebral infarction without residual deficits: Secondary | ICD-10-CM

## 2018-12-12 LAB — COMPREHENSIVE METABOLIC PANEL
ALT: 13 U/L (ref 0–44)
AST: 25 U/L (ref 15–41)
Albumin: 3.4 g/dL — ABNORMAL LOW (ref 3.5–5.0)
Alkaline Phosphatase: 109 U/L (ref 38–126)
Anion gap: 9 (ref 5–15)
BUN: 22 mg/dL (ref 8–23)
CO2: 28 mmol/L (ref 22–32)
Calcium: 8.8 mg/dL — ABNORMAL LOW (ref 8.9–10.3)
Chloride: 98 mmol/L (ref 98–111)
Creatinine, Ser: 1.36 mg/dL — ABNORMAL HIGH (ref 0.61–1.24)
GFR calc Af Amer: 57 mL/min — ABNORMAL LOW (ref >60)
GFR calc non Af Amer: 49 mL/min — ABNORMAL LOW (ref >60)
Glucose, Bld: 215 mg/dL — ABNORMAL HIGH (ref 70–99)
Potassium: 3.6 mmol/L (ref 3.5–5.1)
Sodium: 135 mmol/L (ref 135–145)
Total Bilirubin: 0.4 mg/dL (ref 0.3–1.2)
Total Protein: 7.1 g/dL (ref 6.5–8.1)

## 2018-12-12 LAB — TYPE AND SCREEN
ABO/RH(D): A POS
Antibody Screen: NEGATIVE

## 2018-12-12 LAB — CBC
HCT: 36.7 % — ABNORMAL LOW (ref 39.0–52.0)
Hemoglobin: 11.6 g/dL — ABNORMAL LOW (ref 13.0–17.0)
MCH: 33.5 pg (ref 26.0–34.0)
MCHC: 31.6 g/dL (ref 30.0–36.0)
MCV: 106.1 fL — ABNORMAL HIGH (ref 80.0–100.0)
Platelets: 196 10*3/uL (ref 150–400)
RBC: 3.46 MIL/uL — ABNORMAL LOW (ref 4.22–5.81)
RDW: 14.2 % (ref 11.5–15.5)
WBC: 6.9 10*3/uL (ref 4.0–10.5)
nRBC: 0 % (ref 0.0–0.2)

## 2018-12-12 LAB — HEMOGLOBIN A1C
Hgb A1c MFr Bld: 10 % — ABNORMAL HIGH (ref 4.8–5.6)
Mean Plasma Glucose: 240.3 mg/dL

## 2018-12-12 LAB — PROTIME-INR
INR: 1.9 — ABNORMAL HIGH (ref 0.8–1.2)
Prothrombin Time: 21.3 seconds — ABNORMAL HIGH (ref 11.4–15.2)

## 2018-12-12 LAB — GLUCOSE, CAPILLARY: Glucose-Capillary: 260 mg/dL — ABNORMAL HIGH (ref 70–99)

## 2018-12-12 LAB — APTT: aPTT: 45 seconds — ABNORMAL HIGH (ref 24–36)

## 2018-12-12 MED ORDER — PANTOPRAZOLE SODIUM 40 MG PO TBEC
40.0000 mg | DELAYED_RELEASE_TABLET | Freq: Two times a day (BID) | ORAL | Status: DC
  Administered 2018-12-13 – 2018-12-16 (×7): 40 mg via ORAL
  Filled 2018-12-12 (×7): qty 1

## 2018-12-12 MED ORDER — OXYCODONE HCL 5 MG PO TABS
10.0000 mg | ORAL_TABLET | ORAL | Status: DC | PRN
  Administered 2018-12-12 – 2018-12-16 (×9): 10 mg via ORAL
  Filled 2018-12-12 (×10): qty 2

## 2018-12-12 MED ORDER — FUROSEMIDE 10 MG/ML IJ SOLN
40.0000 mg | Freq: Once | INTRAMUSCULAR | Status: AC
  Administered 2018-12-13: 40 mg via INTRAVENOUS
  Filled 2018-12-12: qty 4

## 2018-12-12 MED ORDER — INSULIN ASPART 100 UNIT/ML ~~LOC~~ SOLN
0.0000 [IU] | Freq: Three times a day (TID) | SUBCUTANEOUS | Status: DC
  Administered 2018-12-13 – 2018-12-14 (×4): 5 [IU] via SUBCUTANEOUS
  Administered 2018-12-14: 3 [IU] via SUBCUTANEOUS
  Administered 2018-12-14: 5 [IU] via SUBCUTANEOUS
  Administered 2018-12-15 (×3): 3 [IU] via SUBCUTANEOUS
  Administered 2018-12-16: 2 [IU] via SUBCUTANEOUS
  Administered 2018-12-16: 5 [IU] via SUBCUTANEOUS

## 2018-12-12 MED ORDER — QUETIAPINE FUMARATE 50 MG PO TABS
50.0000 mg | ORAL_TABLET | Freq: Every day | ORAL | Status: DC
  Administered 2018-12-13 – 2018-12-16 (×4): 50 mg via ORAL
  Filled 2018-12-12 (×4): qty 1

## 2018-12-12 MED ORDER — SODIUM CHLORIDE 0.9% IV SOLUTION
Freq: Once | INTRAVENOUS | Status: AC
  Administered 2018-12-12: 23:00:00 via INTRAVENOUS

## 2018-12-12 MED ORDER — POTASSIUM CHLORIDE CRYS ER 10 MEQ PO TBCR
10.0000 meq | EXTENDED_RELEASE_TABLET | Freq: Every day | ORAL | Status: DC
  Administered 2018-12-13 – 2018-12-16 (×4): 10 meq via ORAL
  Filled 2018-12-12 (×5): qty 1

## 2018-12-12 MED ORDER — ALLOPURINOL 100 MG PO TABS
100.0000 mg | ORAL_TABLET | Freq: Two times a day (BID) | ORAL | Status: DC
  Administered 2018-12-12 – 2018-12-16 (×7): 100 mg via ORAL
  Filled 2018-12-12 (×7): qty 1

## 2018-12-12 MED ORDER — HYDRALAZINE HCL 50 MG PO TABS
100.0000 mg | ORAL_TABLET | Freq: Two times a day (BID) | ORAL | Status: DC
  Administered 2018-12-12 – 2018-12-16 (×6): 100 mg via ORAL
  Filled 2018-12-12 (×7): qty 2

## 2018-12-12 MED ORDER — MENTHOL 3 MG MT LOZG: 1.0000 | LOZENGE | OROMUCOSAL | Status: DC | PRN

## 2018-12-12 MED ORDER — ONDANSETRON HCL 4 MG PO TABS: 4.0000 mg | ORAL_TABLET | Freq: Four times a day (QID) | ORAL | Status: DC | PRN

## 2018-12-12 MED ORDER — PHENOL 1.4 % MT LIQD: 1.0000 | OROMUCOSAL | Status: DC | PRN

## 2018-12-12 MED ORDER — ACETAMINOPHEN 325 MG PO TABS
650.0000 mg | ORAL_TABLET | ORAL | Status: DC | PRN
  Administered 2018-12-14 – 2018-12-16 (×2): 650 mg via ORAL
  Filled 2018-12-12 (×2): qty 2

## 2018-12-12 MED ORDER — SODIUM CHLORIDE 0.9 % IV SOLN
INTRAVENOUS | Status: DC
  Administered 2018-12-13 – 2018-12-15 (×4): via INTRAVENOUS

## 2018-12-12 MED ORDER — HYDROMORPHONE HCL 1 MG/ML IJ SOLN
0.5000 mg | INTRAMUSCULAR | Status: DC | PRN
  Administered 2018-12-13 – 2018-12-16 (×6): 0.5 mg via INTRAVENOUS
  Filled 2018-12-12 (×6): qty 0.5

## 2018-12-12 MED ORDER — ISOSORBIDE MONONITRATE ER 60 MG PO TB24
60.0000 mg | ORAL_TABLET | Freq: Every day | ORAL | Status: DC
  Administered 2018-12-13 – 2018-12-16 (×4): 60 mg via ORAL
  Filled 2018-12-12 (×4): qty 1

## 2018-12-12 MED ORDER — GABAPENTIN 100 MG PO CAPS
100.0000 mg | ORAL_CAPSULE | Freq: Three times a day (TID) | ORAL | Status: DC
  Administered 2018-12-13 – 2018-12-16 (×10): 100 mg via ORAL
  Filled 2018-12-12 (×11): qty 1

## 2018-12-12 MED ORDER — ONDANSETRON HCL 4 MG/2ML IJ SOLN: 4.0000 mg | Freq: Four times a day (QID) | INTRAMUSCULAR | Status: DC | PRN

## 2018-12-12 MED ORDER — DOCUSATE SODIUM 100 MG PO CAPS
100.0000 mg | ORAL_CAPSULE | Freq: Two times a day (BID) | ORAL | Status: DC
  Administered 2018-12-12 – 2018-12-16 (×7): 100 mg via ORAL
  Filled 2018-12-12 (×9): qty 1

## 2018-12-12 MED ORDER — OXYCODONE HCL 5 MG PO TABS: 5.0000 mg | ORAL_TABLET | ORAL | Status: DC | PRN

## 2018-12-12 MED ORDER — PRAVASTATIN SODIUM 40 MG PO TABS
40.0000 mg | ORAL_TABLET | Freq: Every day | ORAL | Status: DC
  Administered 2018-12-12 – 2018-12-15 (×4): 40 mg via ORAL
  Filled 2018-12-12 (×4): qty 1

## 2018-12-12 MED ORDER — VITAMIN K1 10 MG/ML IJ SOLN
10.0000 mg | Freq: Once | INTRAVENOUS | Status: AC
  Administered 2018-12-12: 10 mg via INTRAVENOUS
  Filled 2018-12-12 (×2): qty 1

## 2018-12-12 MED ORDER — ACETAMINOPHEN 650 MG RE SUPP: 650.0000 mg | RECTAL | Status: DC | PRN

## 2018-12-12 MED ORDER — FUROSEMIDE 40 MG PO TABS
40.0000 mg | ORAL_TABLET | Freq: Two times a day (BID) | ORAL | Status: DC
  Administered 2018-12-13 – 2018-12-16 (×7): 40 mg via ORAL
  Filled 2018-12-12 (×7): qty 1

## 2018-12-12 NOTE — H&P (Addendum)
Neurosurgery H&P  CC: back pain  HPI: This is a 78 y.o. man that presents with after a fall onto his back from standing with immediate onset severe back pain. No radicular pain or leg symptoms, no neck pain, has had severe thoracic back pain with muscle spasms with any movement since that time. No new weakness, numbness, or parasthesias, no recent change in bowel or bladder function. No recent use of anti-platelet medications, was taking warfarin. His med list has plavix, which he states he stopped taking 1y ago because the combination of that and warfarin was causing a drop in his Hb, I presume due to occult GI bleed.   With regard to his CHF, he says he can walk about 17600ft before having to stop and rest.  ROS: A 14 point ROS was performed and is negative except as noted in the HPI.   PMHx:  Past Medical History:  Diagnosis Date  . Atrial fibrillation (HCC)   . CAD (coronary artery disease)   . CHF (congestive heart failure) (HCC)   . Diabetes mellitus   . DVT (deep venous thrombosis) (HCC)   . Fall at home 10/2015  . Hyperlipidemia   . Hypertension   . Left-sided carotid artery disease (HCC)   . Myocardial infarction (HCC)   . NSTEMI (non-ST elevated myocardial infarction) (HCC)   . Peripheral vascular disease (HCC)   . Stroke (HCC)   . Venous insufficiency    FamHx:  Family History  Problem Relation Age of Onset  . Other Mother        varicose veins  . Heart disease Mother   . Hyperlipidemia Mother   . Hypertension Mother   . Varicose Veins Mother   . Cancer Father   . Diabetes Father   . Heart disease Father        before age 78  . Hyperlipidemia Father   . Hypertension Father   . Other Father        varicose veins  . Heart attack Father   . Diabetes Daughter   . Hyperlipidemia Daughter   . Hypertension Daughter   . Other Daughter        varicose veins  . Varicose Veins Daughter   . Hypertension Son   . Diabetes Sister   . Heart disease Sister        DVT   . Other Sister        varicose veins  . Hyperlipidemia Sister   . Hypertension Sister   . Varicose Veins Sister   . Other Brother        varicose veins  . Hyperlipidemia Brother   . Hypertension Brother   . Heart attack Brother    SocHx:  reports that he quit smoking about 40 years ago. His smoking use included cigarettes. He has never used smokeless tobacco. He reports that he does not drink alcohol or use drugs.  Exam: Vital signs in last 24 hours: Temp:  [98.3 F (36.8 C)] 98.3 F (36.8 C) (07/31 2014) Pulse Rate:  [60] 60 (07/31 2014) Resp:  [18] 18 (07/31 2014) BP: (159)/(61) 159/61 (07/31 2014) SpO2:  [95 %] 95 % (07/31 2014) General: Awake, alert, cooperative, lying in bed in NAD Head: normocephalic and atruamatic HEENT: neck supple Pulmonary: breathing room air comfortably, no evidence of increased work of breathing Cardiac: RRR  Abdomen: S NT ND Extremities: warm and well perfused x4 Neuro: AOx3, PERRL, EOMI, FS Strength 5/5 x4, SILTx4, no hoffman's, no clonus  Assessment and Plan: 78 y.o. man s/p fall with severe back pain. OSH CT A/P with poor resolution through the bony elements, but appears to show diffuse spontaneous ankylosis and at T10-11 a likely fracture through the anterior osteophytes, disc space, and posterior vertebral osteophytes. MRI T-spine personally reviewed, which shows the above as well as a small epidural hematoma and edema that tracks into the posterior elements. CT T-spine confirmed the presence of a 3-column unstable fracture pattern.  Neuro -NPO p MN for OR for posterior instrumented fusion -T/L spine precautions, log roll if patient requires transfer -cont home gabapentin, quetiapine  Cardiopulm -cont home CPAP -cont home imdur 60qd, hydralazine 100bid,   FENGI -cont home allopurinol, furosemide 40bid -hold home glipizide -start insulin sliding scale -start 75cc/hr NS when he becomes NPO -creatinine bump on screening labs, will  monitor post-op, already on gentle IVF given his CHF  Heme/ID  -hold warfarin (for PAF), gave IV vit K 10mg  x1, currently getting 1U FFP followed by 40 IV lasix, repeat INR at 05:00 preop  Dispo/PPx -SCDs/TEDs, hold SQH until POD2 -PT on hold until post-op -I tried to call his daughter to update her, but she didn't answer, left a message and will try to contact her again tomorrow morning preop  Judith Part, MD 12/12/18 8:31 PM [facility]

## 2018-12-13 ENCOUNTER — Encounter (HOSPITAL_COMMUNITY)
Admission: AD | Disposition: A | Discharge status: Home Health | Payer: Self-pay | Source: Other Acute Inpatient Hospital | Attending: Neurological Surgery

## 2018-12-13 ENCOUNTER — Inpatient Hospital Stay (HOSPITAL_COMMUNITY): Payer: Medicare Other | Admitting: Anesthesiology

## 2018-12-13 ENCOUNTER — Encounter (HOSPITAL_COMMUNITY): Payer: Self-pay | Admitting: *Deleted

## 2018-12-13 ENCOUNTER — Inpatient Hospital Stay (HOSPITAL_COMMUNITY): Payer: Medicare Other

## 2018-12-13 LAB — GLUCOSE, CAPILLARY
Glucose-Capillary: 218 mg/dL — ABNORMAL HIGH (ref 70–99)
Glucose-Capillary: 205 mg/dL — ABNORMAL HIGH (ref 70–99)
Glucose-Capillary: 249 mg/dL — ABNORMAL HIGH (ref 70–99)
Glucose-Capillary: 264 mg/dL — ABNORMAL HIGH (ref 70–99)

## 2018-12-13 LAB — PREPARE FRESH FROZEN PLASMA: Unit division: 0

## 2018-12-13 LAB — PROTIME-INR
INR: 1.3 — ABNORMAL HIGH (ref 0.8–1.2)
Prothrombin Time: 15.8 seconds — ABNORMAL HIGH (ref 11.4–15.2)

## 2018-12-13 LAB — BPAM FFP
Blood Product Expiration Date: 202008052359
ISSUE DATE / TIME: 202007312325
Unit Type and Rh: 6200

## 2018-12-13 LAB — SARS CORONAVIRUS 2 BY RT PCR (HOSPITAL ORDER, PERFORMED IN ~~LOC~~ HOSPITAL LAB): SARS Coronavirus 2: NEGATIVE

## 2018-12-13 LAB — SURGICAL PCR SCREEN
MRSA, PCR: NEGATIVE
Staphylococcus aureus: NEGATIVE

## 2018-12-13 SURGERY — POSTERIOR LUMBAR FUSION 2 LEVEL
Anesthesia: General | Site: Back

## 2018-12-13 MED ORDER — FENTANYL CITRATE (PF) 250 MCG/5ML IJ SOLN
INTRAMUSCULAR | Status: AC
  Filled 2018-12-13: qty 5

## 2018-12-13 MED ORDER — LIDOCAINE-EPINEPHRINE 1 %-1:100000 IJ SOLN
INTRAMUSCULAR | Status: DC | PRN
  Administered 2018-12-13: 5 mL via INTRADERMAL

## 2018-12-13 MED ORDER — BUPIVACAINE HCL (PF) 0.5 % IJ SOLN
INTRAMUSCULAR | Status: DC | PRN
  Administered 2018-12-13: 5 mL

## 2018-12-13 MED ORDER — PHENYLEPHRINE 40 MCG/ML (10ML) SYRINGE FOR IV PUSH (FOR BLOOD PRESSURE SUPPORT)
PREFILLED_SYRINGE | INTRAVENOUS | Status: AC
  Filled 2018-12-13: qty 10

## 2018-12-13 MED ORDER — DEXAMETHASONE SODIUM PHOSPHATE 10 MG/ML IJ SOLN
INTRAMUSCULAR | Status: DC | PRN
  Administered 2018-12-13: 10 mg via INTRAVENOUS

## 2018-12-13 MED ORDER — LIDOCAINE 2% (20 MG/ML) 5 ML SYRINGE
INTRAMUSCULAR | Status: DC | PRN
  Administered 2018-12-13: 100 mg via INTRAVENOUS

## 2018-12-13 MED ORDER — ONDANSETRON HCL 4 MG/2ML IJ SOLN: 4.0000 mg | Freq: Once | INTRAMUSCULAR | Status: DC | PRN

## 2018-12-13 MED ORDER — THROMBIN 5000 UNITS EX SOLR
CUTANEOUS | Status: AC
  Filled 2018-12-13: qty 5000

## 2018-12-13 MED ORDER — HYDROMORPHONE HCL 1 MG/ML IJ SOLN: 0.2500 mg | INTRAMUSCULAR | Status: DC | PRN

## 2018-12-13 MED ORDER — ACETAMINOPHEN 10 MG/ML IV SOLN
INTRAVENOUS | Status: AC
  Filled 2018-12-13: qty 100

## 2018-12-13 MED ORDER — LIDOCAINE 2% (20 MG/ML) 5 ML SYRINGE
INTRAMUSCULAR | Status: AC
  Filled 2018-12-13: qty 5

## 2018-12-13 MED ORDER — MIDAZOLAM HCL 5 MG/5ML IJ SOLN
INTRAMUSCULAR | Status: DC | PRN
  Administered 2018-12-13 (×2): 1 mg via INTRAVENOUS

## 2018-12-13 MED ORDER — BUPIVACAINE HCL (PF) 0.5 % IJ SOLN
INTRAMUSCULAR | Status: AC
  Filled 2018-12-13: qty 30

## 2018-12-13 MED ORDER — 0.9 % SODIUM CHLORIDE (POUR BTL) OPTIME
TOPICAL | Status: DC | PRN
  Administered 2018-12-13: 11:00:00 1000 mL

## 2018-12-13 MED ORDER — ROCURONIUM BROMIDE 10 MG/ML (PF) SYRINGE
PREFILLED_SYRINGE | INTRAVENOUS | Status: DC | PRN
  Administered 2018-12-13: 100 mg via INTRAVENOUS

## 2018-12-13 MED ORDER — PROPOFOL 10 MG/ML IV BOLUS
INTRAVENOUS | Status: DC | PRN
  Administered 2018-12-13: 100 mg via INTRAVENOUS

## 2018-12-13 MED ORDER — ONDANSETRON HCL 4 MG/2ML IJ SOLN
INTRAMUSCULAR | Status: DC | PRN
  Administered 2018-12-13: 4 mg via INTRAVENOUS

## 2018-12-13 MED ORDER — CEFAZOLIN SODIUM-DEXTROSE 2-4 GM/100ML-% IV SOLN
2.0000 g | Freq: Once | INTRAVENOUS | Status: AC
  Administered 2018-12-13: 2 g via INTRAVENOUS

## 2018-12-13 MED ORDER — INSULIN ASPART 100 UNIT/ML ~~LOC~~ SOLN
SUBCUTANEOUS | Status: AC
  Filled 2018-12-13: qty 1

## 2018-12-13 MED ORDER — FENTANYL CITRATE (PF) 250 MCG/5ML IJ SOLN
INTRAMUSCULAR | Status: DC | PRN
  Administered 2018-12-13 (×2): 50 ug via INTRAVENOUS
  Administered 2018-12-13: 200 ug via INTRAVENOUS
  Administered 2018-12-13 (×2): 50 ug via INTRAVENOUS

## 2018-12-13 MED ORDER — SUGAMMADEX SODIUM 200 MG/2ML IV SOLN
INTRAVENOUS | Status: DC | PRN
  Administered 2018-12-13: 200 mg via INTRAVENOUS

## 2018-12-13 MED ORDER — LACTATED RINGERS IV SOLN
INTRAVENOUS | Status: DC
  Administered 2018-12-13 (×3): via INTRAVENOUS

## 2018-12-13 MED ORDER — CEFAZOLIN SODIUM-DEXTROSE 2-4 GM/100ML-% IV SOLN
INTRAVENOUS | Status: AC
  Filled 2018-12-13: qty 100

## 2018-12-13 MED ORDER — OXYCODONE HCL 5 MG PO TABS
ORAL_TABLET | ORAL | Status: AC
  Filled 2018-12-13: qty 2

## 2018-12-13 MED ORDER — BACITRACIN ZINC 500 UNIT/GM EX OINT
TOPICAL_OINTMENT | CUTANEOUS | Status: AC
  Filled 2018-12-13: qty 28.35

## 2018-12-13 MED ORDER — DEXAMETHASONE SODIUM PHOSPHATE 10 MG/ML IJ SOLN
INTRAMUSCULAR | Status: AC
  Filled 2018-12-13: qty 1

## 2018-12-13 MED ORDER — MUPIROCIN 2 % EX OINT
1.0000 "application " | TOPICAL_OINTMENT | Freq: Two times a day (BID) | CUTANEOUS | Status: DC
  Administered 2018-12-13 – 2018-12-16 (×7): 1 via NASAL
  Filled 2018-12-13: qty 22

## 2018-12-13 MED ORDER — PROPOFOL 10 MG/ML IV BOLUS
INTRAVENOUS | Status: AC
  Filled 2018-12-13: qty 20

## 2018-12-13 MED ORDER — GLYCOPYRROLATE PF 0.2 MG/ML IJ SOSY
PREFILLED_SYRINGE | INTRAMUSCULAR | Status: AC
  Filled 2018-12-13: qty 4

## 2018-12-13 MED ORDER — ONDANSETRON HCL 4 MG/2ML IJ SOLN
INTRAMUSCULAR | Status: AC
  Filled 2018-12-13: qty 2

## 2018-12-13 MED ORDER — MIDAZOLAM HCL 2 MG/2ML IJ SOLN
INTRAMUSCULAR | Status: AC
  Filled 2018-12-13: qty 2

## 2018-12-13 MED ORDER — INSULIN ASPART 100 UNIT/ML ~~LOC~~ SOLN
0.0000 [IU] | Freq: Every day | SUBCUTANEOUS | Status: DC
  Administered 2018-12-13: 5 [IU] via SUBCUTANEOUS

## 2018-12-13 MED ORDER — EPHEDRINE SULFATE 50 MG/ML IJ SOLN
INTRAMUSCULAR | Status: DC | PRN
  Administered 2018-12-13 (×4): 5 mg via INTRAVENOUS

## 2018-12-13 MED ORDER — THROMBIN 5000 UNITS EX SOLR
OROMUCOSAL | Status: DC | PRN
  Administered 2018-12-13: 11:00:00 5 mL via TOPICAL

## 2018-12-13 MED ORDER — MEPERIDINE HCL 25 MG/ML IJ SOLN: 6.2500 mg | INTRAMUSCULAR | Status: DC | PRN

## 2018-12-13 MED ORDER — SODIUM CHLORIDE 0.9 % IV SOLN
INTRAVENOUS | Status: DC | PRN
  Administered 2018-12-13: 25 ug/min via INTRAVENOUS

## 2018-12-13 MED ORDER — ARTIFICIAL TEARS OPHTHALMIC OINT
TOPICAL_OINTMENT | OPHTHALMIC | Status: AC
  Filled 2018-12-13: qty 10.5

## 2018-12-13 MED ORDER — GLYCOPYRROLATE 0.2 MG/ML IJ SOLN
INTRAMUSCULAR | Status: DC | PRN
  Administered 2018-12-13 (×3): 0.1 mg via INTRAVENOUS

## 2018-12-13 MED ORDER — SODIUM CHLORIDE 0.9 % IV SOLN
INTRAVENOUS | Status: DC | PRN
  Administered 2018-12-13: 11:00:00 500 mL

## 2018-12-13 MED ORDER — INSULIN ASPART 100 UNIT/ML ~~LOC~~ SOLN: 0.0000 [IU] | Freq: Three times a day (TID) | SUBCUTANEOUS | Status: DC

## 2018-12-13 MED ORDER — LIDOCAINE-EPINEPHRINE 1 %-1:100000 IJ SOLN
INTRAMUSCULAR | Status: AC
  Filled 2018-12-13: qty 1

## 2018-12-13 MED ORDER — ARTIFICIAL TEARS OPHTHALMIC OINT
TOPICAL_OINTMENT | OPHTHALMIC | Status: DC | PRN
  Administered 2018-12-13: 1 via OPHTHALMIC

## 2018-12-13 MED ORDER — KETOROLAC TROMETHAMINE 30 MG/ML IJ SOLN
INTRAMUSCULAR | Status: AC
  Filled 2018-12-13: qty 1

## 2018-12-13 SURGICAL SUPPLY — 73 items
ADH SKN CLS APL DERMABOND .7 (GAUZE/BANDAGES/DRESSINGS) ×1
APL SKNCLS STERI-STRIP NONHPOA (GAUZE/BANDAGES/DRESSINGS)
BAG DECANTER FOR FLEXI CONT (MISCELLANEOUS) ×2 IMPLANT
BASKET BONE COLLECTION (BASKET) ×2 IMPLANT
BENZOIN TINCTURE PRP APPL 2/3 (GAUZE/BANDAGES/DRESSINGS) IMPLANT
BLADE CLIPPER SURG (BLADE) IMPLANT
BLADE SURG 11 STRL SS (BLADE) ×2 IMPLANT
BONE CANC CHIPS 40CC CAN1/2 (Bone Implant) ×2 IMPLANT
BUR MATCHSTICK NEURO 3.0 LAGG (BURR) ×2 IMPLANT
BUR PRECISION FLUTE 5.0 (BURR) ×2 IMPLANT
CANISTER SUCT 3000ML PPV (MISCELLANEOUS) ×2 IMPLANT
CHIPS CANC BONE 40CC CAN1/2 (Bone Implant) ×1 IMPLANT
CONT SPEC 4OZ CLIKSEAL STRL BL (MISCELLANEOUS) ×2 IMPLANT
COVER BACK TABLE 60X90IN (DRAPES) ×2 IMPLANT
COVER WAND RF STERILE (DRAPES) ×2 IMPLANT
DECANTER SPIKE VIAL GLASS SM (MISCELLANEOUS) ×2 IMPLANT
DERMABOND ADVANCED (GAUZE/BANDAGES/DRESSINGS) ×1
DERMABOND ADVANCED .7 DNX12 (GAUZE/BANDAGES/DRESSINGS) ×1 IMPLANT
DRAPE C-ARM 42X72 X-RAY (DRAPES) ×1 IMPLANT
DRAPE C-ARMOR (DRAPES) ×1 IMPLANT
DRAPE LAPAROTOMY 100X72X124 (DRAPES) ×2 IMPLANT
DRAPE SURG 17X23 STRL (DRAPES) ×2 IMPLANT
DRSG OPSITE POSTOP 4X10 (GAUZE/BANDAGES/DRESSINGS) ×1 IMPLANT
DURAPREP 26ML APPLICATOR (WOUND CARE) ×2 IMPLANT
ELECT REM PT RETURN 9FT ADLT (ELECTROSURGICAL) ×2
ELECTRODE REM PT RTRN 9FT ADLT (ELECTROSURGICAL) ×1 IMPLANT
GAUZE 4X4 16PLY RFD (DISPOSABLE) IMPLANT
GAUZE SPONGE 4X4 12PLY STRL (GAUZE/BANDAGES/DRESSINGS) IMPLANT
GLOVE BIO SURGEON STRL SZ 6.5 (GLOVE) ×1 IMPLANT
GLOVE BIO SURGEON STRL SZ7 (GLOVE) ×3 IMPLANT
GLOVE BIO SURGEON STRL SZ7.5 (GLOVE) ×4 IMPLANT
GLOVE BIOGEL PI IND STRL 7.5 (GLOVE) ×2 IMPLANT
GLOVE BIOGEL PI INDICATOR 7.5 (GLOVE) ×3
GLOVE EXAM NITRILE LRG STRL (GLOVE) IMPLANT
GLOVE EXAM NITRILE XL STR (GLOVE) IMPLANT
GLOVE EXAM NITRILE XS STR PU (GLOVE) IMPLANT
GOWN STRL REUS W/ TWL LRG LVL3 (GOWN DISPOSABLE) ×4 IMPLANT
GOWN STRL REUS W/ TWL XL LVL3 (GOWN DISPOSABLE) IMPLANT
GOWN STRL REUS W/TWL 2XL LVL3 (GOWN DISPOSABLE) IMPLANT
GOWN STRL REUS W/TWL LRG LVL3 (GOWN DISPOSABLE) ×8
GOWN STRL REUS W/TWL XL LVL3 (GOWN DISPOSABLE)
GRAFT BNE CHIP CANC 1-8 40 (Bone Implant) IMPLANT
HEMOSTAT POWDER KIT SURGIFOAM (HEMOSTASIS) ×2 IMPLANT
KIT BASIN OR (CUSTOM PROCEDURE TRAY) ×2 IMPLANT
KIT POSITION SURG JACKSON T1 (MISCELLANEOUS) ×2 IMPLANT
KIT TURNOVER KIT B (KITS) ×2 IMPLANT
MILL MEDIUM DISP (BLADE) ×3 IMPLANT
NDL HYPO 18GX1.5 BLUNT FILL (NEEDLE) IMPLANT
NDL SPNL 18GX3.5 QUINCKE PK (NEEDLE) IMPLANT
NEEDLE HYPO 18GX1.5 BLUNT FILL (NEEDLE) IMPLANT
NEEDLE HYPO 22GX1.5 SAFETY (NEEDLE) ×2 IMPLANT
NEEDLE SPNL 18GX3.5 QUINCKE PK (NEEDLE) ×2 IMPLANT
NS IRRIG 1000ML POUR BTL (IV SOLUTION) ×2 IMPLANT
PACK LAMINECTOMY NEURO (CUSTOM PROCEDURE TRAY) ×2 IMPLANT
ROD PED SOLERA STRT 5.5X500 (Rod) ×1 IMPLANT
SCREW 5.5X40 (Screw) ×7 IMPLANT
SCREW FENS SOLERA 5.5X40 (Screw) ×1 IMPLANT
SCREW SET SOLERA (Screw) ×24 IMPLANT
SCREW SET SOLERA TI5.5 (Screw) IMPLANT
SCREW SPINAL 4.5X35MM COBALT (Screw) ×2 IMPLANT
SCREW SPINAL 4.5X40MM SOLERA (Screw) ×2 IMPLANT
SPONGE LAP 4X18 RFD (DISPOSABLE) IMPLANT
SPONGE SURGIFOAM ABS GEL 100 (HEMOSTASIS) ×1 IMPLANT
STRIP CLOSURE SKIN 1/2X4 (GAUZE/BANDAGES/DRESSINGS) IMPLANT
SUT MNCRL AB 3-0 PS2 18 (SUTURE) ×2 IMPLANT
SUT VIC AB 0 CT1 18XCR BRD8 (SUTURE) ×1 IMPLANT
SUT VIC AB 0 CT1 8-18 (SUTURE) ×6
SUT VIC AB 2-0 CP2 18 (SUTURE) ×4 IMPLANT
SYR 3ML LL SCALE MARK (SYRINGE) IMPLANT
TOWEL GREEN STERILE (TOWEL DISPOSABLE) ×2 IMPLANT
TOWEL GREEN STERILE FF (TOWEL DISPOSABLE) ×2 IMPLANT
TRAY FOLEY MTR SLVR 16FR STAT (SET/KITS/TRAYS/PACK) ×2 IMPLANT
WATER STERILE IRR 1000ML POUR (IV SOLUTION) ×2 IMPLANT

## 2018-12-13 NOTE — Brief Op Note (Signed)
12/13/2018  1:11 PM  PATIENT:  Isaiah [name]  78 y.o. male  PRE-OPERATIVE DIAGNOSIS:  THORACIC SPINE FRACTURE  POST-OPERATIVE DIAGNOSIS:  THORACIC SPINE FRACTURE  PROCEDURE:  Procedure(s): THORACIC EIGHT - LUMBAR ONE POSTERIOR  INSTRUMENTED SPINAL FUSION (N/A)  SURGEON:  Surgeon(s) and Role:    * Meera Vasco, Joyice Faster, MD - Primary  PHYSICIAN ASSISTANT:   ASSISTANTS: none   ANESTHESIA:   general  EBL:  300 mL   BLOOD ADMINISTERED:none  DRAINS: none   LOCAL MEDICATIONS USED:  LIDOCAINE   SPECIMEN:  No Specimen  DISPOSITION OF SPECIMEN:  N/A  COUNTS:  YES  TOURNIQUET:  * No tourniquets in log *  DICTATION: .Note written in EPIC  PLAN OF CARE: Admit to inpatient   PATIENT DISPOSITION:  PACU - hemodynamically stable.   Delay start of Pharmacological VTE agent (>24hrs) due to surgical blood loss or risk of bleeding: yes

## 2018-12-13 NOTE — Anesthesia Preprocedure Evaluation (Addendum)
Anesthesia Evaluation  Patient identified by MRN, date of birth, ID band Patient awake    Reviewed: Allergy & Precautions, NPO status , Patient's Chart, lab work & pertinent test results  Airway Mallampati: II  TM Distance: >3 FB Neck ROM: Full    Dental   Pulmonary sleep apnea , former smoker,    Pulmonary exam normal        Cardiovascular hypertension, Pt. on medications + CAD, + Past MI, + Cardiac Stents, + CABG and +CHF  Normal cardiovascular exam     Neuro/Psych CVA    GI/Hepatic   Endo/Other  diabetes  Renal/GU Renal InsufficiencyRenal disease     Musculoskeletal   Abdominal   Peds  Hematology   Anesthesia Other Findings   Reproductive/Obstetrics                            Anesthesia Physical Anesthesia Plan  ASA: III  Anesthesia Plan: General   Post-op Pain Management:    Induction: Intravenous  PONV Risk Score and Plan: 2 and Ondansetron and Treatment may vary due to age or medical condition  Airway Management Planned: Oral ETT  Additional Equipment: Arterial line  Intra-op Plan:   Post-operative Plan: Extubation in OR  Informed Consent: I have reviewed the patients History and Physical, chart, labs and discussed the procedure including the risks, benefits and alternatives for the proposed anesthesia with the patient or authorized representative who has indicated his/her understanding and acceptance.       Plan Discussed with: CRNA and Surgeon  Anesthesia Plan Comments:         Anesthesia Quick Evaluation

## 2018-12-13 NOTE — Anesthesia Postprocedure Evaluation (Signed)
Anesthesia Post Note  Patient: AZARYAH OLEKSY  Procedure(s) Performed: THORACIC EIGHT - LUMBAR ONE POSTERIOR  INSTRUMENTED SPINAL FUSION (N/A Back)     Patient location during evaluation: PACU Anesthesia Type: General Level of consciousness: awake and alert Pain management: pain level controlled Vital Signs Assessment: post-procedure vital signs reviewed and stable Respiratory status: spontaneous breathing, nonlabored ventilation, respiratory function stable and patient connected to nasal cannula oxygen Cardiovascular status: blood pressure returned to baseline and stable Postop Assessment: no apparent nausea or vomiting Anesthetic complications: no    Last Vitals:  Vitals:   12/13/18 1345 12/13/18 1546  BP: (!) 156/45 (!) 127/107  Pulse: (!) 58 (!) 54  Resp: 15 12  Temp:  36.6 C  SpO2: 95% 97%    Last Pain:  Vitals:   12/13/18 1546  TempSrc: Oral  PainSc:                  Season Astacio DAVID

## 2018-12-13 NOTE — Anesthesia Procedure Notes (Signed)
Procedure Name: Intubation Date/Time: 12/13/2018 9:21 AM Performed by: Clearnce Sorrel, CRNA Pre-anesthesia Checklist: Patient identified, Emergency Drugs available, Suction available, Patient being monitored and Timeout performed Patient Re-evaluated:Patient Re-evaluated prior to induction Oxygen Delivery Method: Circle system utilized Preoxygenation: Pre-oxygenation with 100% oxygen Induction Type: IV induction Ventilation: Oral airway inserted - appropriate to patient size and Mask ventilation without difficulty Laryngoscope Size: Mac and 4 Grade View: Grade I Tube type: Oral Tube size: 7.5 mm Number of attempts: 1 Airway Equipment and Method: Stylet Placement Confirmation: ETT inserted through vocal cords under direct vision,  positive ETCO2 and breath sounds checked- equal and bilateral Secured at: 22 cm Tube secured with: Tape Dental Injury: Teeth and Oropharynx as per pre-operative assessment

## 2018-12-13 NOTE — Progress Notes (Signed)
Assisted Patient with Home CPAP machine and placement of O2 bleed in. Patient doing well and no issues at this time. On home machine and resting.

## 2018-12-13 NOTE — Transfer of Care (Signed)
Immediate Anesthesia Transfer of Care Note  Patient: Isaiah [name]  Procedure(s) Performed: THORACIC EIGHT - LUMBAR ONE POSTERIOR  INSTRUMENTED SPINAL FUSION (N/A Back)  Patient Location: PACU  Anesthesia Type:General  Level of Consciousness: awake and alert   Airway & Oxygen Therapy: Patient Spontanous Breathing and Patient connected to face mask oxygen  Post-op Assessment: Report given to RN and Post -op Vital signs reviewed and stable  Post vital signs: Reviewed and stable  Last Vitals:  Vitals Value Taken Time  BP 157/45 12/13/18 1315  Temp    Pulse 52 12/13/18 1317  Resp 16 12/13/18 1317  SpO2 99 % 12/13/18 1317  Vitals shown include unvalidated device data.  Last Pain:  Vitals:   12/13/18 0735  TempSrc: Axillary  PainSc: 4       Patients Stated Pain Goal: 0 (24/23/53 6144)  Complications: No apparent anesthesia complications

## 2018-12-13 NOTE — Op Note (Signed)
PATIENT: Isaiah [name]  DAY OF SURGERY: 12/13/18   PRE-OPERATIVE DIAGNOSIS:  Unstable closed fracture of the thoracic spine   POST-OPERATIVE DIAGNOSIS:  Unstable closed fracture of the thoracic spine   PROCEDURE:  T8-L1 posterior thoracolumbar instrumented fusion   SURGEON:  Surgeon(s) and Role:    Judith Part, MD - Primary   ANESTHESIA: ETGA   BRIEF HISTORY: This is a 78 year old man who presented with severe back pain after a fall. The patient was found to have DISH changes in his spine with a 3 column fracture at T10-11. Of note, this fracture is located at T10-11 when counting up from the sacrum and T9-10 when counting down from the occiput. Since it is fairly caudal in the thoracic spine, I used the sacrum for localization and will therefore use T10-11 as the level of fracture for my operative report, so two levels above and below would mean a T8-L1 instrumented fusion. This was discussed with the patient as well as risks, benefits, and alternatives and wished to proceed with surgical treatment.   OPERATIVE DETAIL: The patient was taken to the operating room and anesthesia was induced by the anesthesia team. They were placed on the OR table in the prone position with padding of all pressure points. A formal time out was performed with two patient identifiers and confirmed the operative site. The operative site was marked, hair was clipped with surgical clippers, the area was then prepped and draped in a sterile fashion. Fluoro was used to localize the operative level and a midline incision was placed to expose from T8 to L1. Subperiosteal dissection was performed bilaterally and fluoroscopy was again used to confirm the surgical level. The fracture in the posterior elements was clearly evident visually in the field. This was confirmed on fluoroscopy, where there was some fish-mouthing of the disc space and some anterolisthesis of T10-11 when compared to his preop films.    Instrumentation was then performed. Fluoroscopy was used to guide placement of bilateral pedicle screws ([employer]) bilaterally at T8, T9, T10, T11, T12, and L1. These were placed by localizing the pedicle with standard landmarks, drilling a pilot hole, cannulating the pedicle with an awl, palpating for pedicle wall breaches, tapping, palpating, and then placing the screw. These were connected with rods bilaterally and final tightened according to manufacturer torque specifications. Portions of the rib heads were removed to allow for the screw head to be placed. These pieces of bone were morselized to provide local autograft for the fusion. The bone was thoroughly decorticated over the fusion surface and the previously resected bone fragments were morselized and used as autograft with the addition of 40cc of cortical bone chips (Medtronic).   Hemostasis was obtained, all instrument and sponge counts were correct, the incision was then closed in layers. The patient was then returned to anesthesia for emergence. No apparent complications at the completion of the procedure.   EBL:  345mL   DRAINS: none   SPECIMENS: none   Judith Part, MD 12/13/18 8:59 AM

## 2018-12-13 NOTE — Anesthesia Procedure Notes (Signed)
Arterial Line Insertion Start/End8/05/2018 8:45 AM, 12/13/2018 8:50 AM Performed by: Clearnce Sorrel, CRNA, CRNA  Patient location: Pre-op. Preanesthetic checklist: patient identified, IV checked, site marked, risks and benefits discussed, surgical consent, monitors and equipment checked, pre-op evaluation, timeout performed and anesthesia consent Lidocaine 1% used for infiltration Right, radial was placed Catheter size: 20 Fr Hand hygiene performed  and maximum sterile barriers used   Attempts: 1 Procedure performed without using ultrasound guided technique. Following insertion, dressing applied. Post procedure assessment: normal and unchanged

## 2018-12-14 ENCOUNTER — Other Ambulatory Visit: Payer: Self-pay

## 2018-12-14 ENCOUNTER — Inpatient Hospital Stay (HOSPITAL_COMMUNITY): Payer: Medicare Other

## 2018-12-14 LAB — GLUCOSE, CAPILLARY
Glucose-Capillary: 186 mg/dL — ABNORMAL HIGH (ref 70–99)
Glucose-Capillary: 248 mg/dL — ABNORMAL HIGH (ref 70–99)
Glucose-Capillary: 232 mg/dL — ABNORMAL HIGH (ref 70–99)
Glucose-Capillary: 165 mg/dL — ABNORMAL HIGH (ref 70–99)

## 2018-12-14 LAB — RENAL FUNCTION PANEL
Albumin: 2.9 g/dL — ABNORMAL LOW (ref 3.5–5.0)
Anion gap: 10 (ref 5–15)
BUN: 28 mg/dL — ABNORMAL HIGH (ref 8–23)
CO2: 27 mmol/L (ref 22–32)
Calcium: 8.6 mg/dL — ABNORMAL LOW (ref 8.9–10.3)
Chloride: 101 mmol/L (ref 98–111)
Creatinine, Ser: 1.64 mg/dL — ABNORMAL HIGH (ref 0.61–1.24)
GFR calc Af Amer: 46 mL/min — ABNORMAL LOW (ref >60)
GFR calc non Af Amer: 39 mL/min — ABNORMAL LOW (ref >60)
Glucose, Bld: 190 mg/dL — ABNORMAL HIGH (ref 70–99)
Phosphorus: 3 mg/dL (ref 2.5–4.6)
Potassium: 3.7 mmol/L (ref 3.5–5.1)
Sodium: 138 mmol/L (ref 135–145)

## 2018-12-14 MED ORDER — GLIPIZIDE 5 MG PO TABS
5.0000 mg | ORAL_TABLET | Freq: Two times a day (BID) | ORAL | Status: DC
  Administered 2018-12-14 – 2018-12-16 (×6): 5 mg via ORAL
  Filled 2018-12-14 (×6): qty 1

## 2018-12-14 NOTE — Progress Notes (Signed)
Neurosurgery Service Progress Note  Subjective: No acute events overnight. Back pain improved from preop, pt reports ambulating well this morning, but with some back soreness, no lower extremity Sx  Objective: Vitals:   12/13/18 1546 12/13/18 2121 12/13/18 2359 12/14/18 0436  BP: (!) 127/107 (!) 145/47 112/87 (!) 140/40  Pulse: (!) 54 75 (!) 56 (!) 55  Resp: 12 18 18 18   Temp: 97.9 F (36.6 C) 98.9 F (37.2 C) 97.9 F (36.6 C) 99.1 F (37.3 C)  TempSrc: Oral Oral Oral Oral  SpO2: 97% 97% 100% 99%  Weight:      Height:       Temp (24hrs), Avg:98.1 F (36.7 C), Min:97 F (36.1 C), Max:99.1 F (37.3 C)  CBC Latest Ref Rng & Units 12/12/2018 08/23/2016 08/22/2016  WBC 4.0 - 10.5 K/uL 6.9 7.2 8.2  Hemoglobin 13.0 - 17.0 g/dL 11.6(L) 10.1(L) 10.4(L)  Hematocrit 39.0 - 52.0 % 36.7(L) 32.9(L) 32.8(L)  Platelets 150 - 400 K/uL 196 279 275   BMP Latest Ref Rng & Units 12/12/2018 08/28/2016 08/23/2016  Glucose 70 - 99 mg/dL 161(W215(H) 960(A133(H) 540(J196(H)  BUN 8 - 23 mg/dL 22 24 81(X43(H)  Creatinine 0.61 - 1.24 mg/dL 9.14(N1.36(H) 8.29(F1.77(H) 6.21(H1.78(H)  Sodium 135 - 145 mmol/L 135 138 131(L)  Potassium 3.5 - 5.1 mmol/L 3.6 3.6 4.4  Chloride 98 - 111 mmol/L 98 97(L) 95(L)  CO2 22 - 32 mmol/L 28 28 27   Calcium 8.9 - 10.3 mg/dL 0.8(M8.8(L) 9.5 9.6    Intake/Output Summary (Last 24 hours) at 12/14/2018 0649 Last data filed at 12/14/2018 0500 Gross per 24 hour  Intake 2658.48 ml  Output 2125 ml  Net 533.48 ml    Current Facility-Administered Medications:  .  0.9 %  sodium chloride infusion, , Intravenous, Continuous, Sandon Yoho, Clovis Puhomas A, MD, Last Rate: 75 mL/hr at 12/13/18 1343 .  acetaminophen (TYLENOL) tablet 650 mg, 650 mg, Oral, Q4H PRN **OR** acetaminophen (TYLENOL) suppository 650 mg, 650 mg, Rectal, Q4H PRN, Jadene Pierinistergard, Emeric Novinger A, MD .  allopurinol (ZYLOPRIM) tablet 100 mg, 100 mg, Oral, BID, Jadene Pierinistergard, Annalicia Renfrew A, MD, 100 mg at 12/13/18 2221 .  docusate sodium (COLACE) capsule 100 mg, 100 mg, Oral, BID, Emiel Kielty,  Treson Laura A, MD, 100 mg at 12/13/18 2222 .  furosemide (LASIX) tablet 40 mg, 40 mg, Oral, BID, Jadene Pierinistergard, Laporche Martelle A, MD, 40 mg at 12/13/18 1806 .  gabapentin (NEURONTIN) capsule 100 mg, 100 mg, Oral, TID, Jadene Pierinistergard, Fard Borunda A, MD, 100 mg at 12/13/18 2222 .  hydrALAZINE (APRESOLINE) tablet 100 mg, 100 mg, Oral, BID, Jadene Pierinistergard, Latrenda Irani A, MD, 100 mg at 12/13/18 2222 .  HYDROmorphone (DILAUDID) injection 0.5 mg, 0.5 mg, Intravenous, Q3H PRN, Jadene Pierinistergard, Aubriella Perezgarcia A, MD, 0.5 mg at 12/13/18 1845 .  insulin aspart (novoLOG) injection 0-15 Units, 0-15 Units, Subcutaneous, TID WC, Jadene Pierinistergard, Aysel Gilchrest A, MD, 3 Units at 12/14/18 0631 .  insulin aspart (novoLOG) injection 0-5 Units, 0-5 Units, Subcutaneous, QHS, Jadene Pierinistergard, Aldrick Derrig A, MD, 5 Units at 12/13/18 2259 .  isosorbide mononitrate (IMDUR) 24 hr tablet 60 mg, 60 mg, Oral, Daily, Jadene Pierinistergard, Lakin Rhine A, MD, 60 mg at 12/13/18 1806 .  lactated ringers infusion, , Intravenous, Continuous, Latrisa Hellums, Clovis Puhomas A, MD, Last Rate: 10 mL/hr at 12/13/18 0820 .  menthol-cetylpyridinium (CEPACOL) lozenge 3 mg, 1 lozenge, Oral, PRN **OR** phenol (CHLORASEPTIC) mouth spray 1 spray, 1 spray, Mouth/Throat, PRN, Cathalina Barcia, Clovis Puhomas A, MD .  mupirocin ointment (BACTROBAN) 2 % 1 application, 1 application, Nasal, BID, Jadene Pierinistergard, Chantele Corado A, MD, 1 application at 12/13/18 2223 .  ondansetron (ZOFRAN) tablet 4 mg, 4 mg, Oral, Q6H PRN **OR** ondansetron (ZOFRAN) injection 4 mg, 4 mg, Intravenous, Q6H PRN, Huntley Demedeiros A, MD .  oxyCODONE (Oxy IR/ROXICODONE) immediate release tablet 10 mg, 10 mg, Oral, Q4H PRN, Judith Part, MD, 10 mg at 12/14/18 0244 .  oxyCODONE (Oxy IR/ROXICODONE) immediate release tablet 5 mg, 5 mg, Oral, Q4H PRN, Gao Mitnick A, MD .  pantoprazole (PROTONIX) EC tablet 40 mg, 40 mg, Oral, BID AC, Judith Part, MD, 40 mg at 12/14/18 0610 .  potassium chloride (K-DUR) CR tablet 10 mEq, 10 mEq, Oral, Daily, Judith Part, MD, 10 mEq at 12/13/18 1805 .   pravastatin (PRAVACHOL) tablet 40 mg, 40 mg, Oral, QHS, Lyfe Reihl, Joyice Faster, MD, 40 mg at 12/13/18 2222 .  QUEtiapine (SEROQUEL) tablet 50 mg, 50 mg, Oral, Q supper, Judith Part, MD, 50 mg at 12/13/18 1806   Physical Exam: AOx3, PERRL, EOMI, FS, Strength 5/5 x4, SILTx4 Incision c/d/i with dressing  Assessment & Plan: 78 y.o. man w/ likely AS vs DISH s/p fall with 3 column thoracic fracture s/p posterior instrumented fusion, recovering well. Post-op uprights show hardware in good position  Neuro -XR reviewed, show hardware in good position, stable 49mm anterolisthesis at fracture site, which is stable from intra-op -cont home gabapentin, quetiapine  Cardiopulm -cont home CPAP -cont home imdur 60qd, hydralazine 100bid  FENGI -cont home allopurinol, furosemide 40bid -restart home glipizide no that he's no longer npo -keep ISS the same unless glipizide doesn't help -RFP today to eval renal function  Heme/ID  -warfarin (for PAF) on hold until 12/20/2018  Dispo/PPx -PT/OT eval -SCDs/TEDs, hold SQH until 12/15/2018  Judith Part  12/14/18 6:49 AM

## 2018-12-14 NOTE — Discharge Summary (Signed)
Discharge Summary  Date of Admission: 12/12/2018  Date of Discharge: 12/16/2018  Attending Physician: Emelda Brothers, MD  Hospital Course: Patient was admitted after a fall with severe back pain. CT and MRI showed an epidural hematoma at T10-11 with ankylotic changes c/w DISH or AS with a 3 column fracture through T10-11. He was taken to the OR for a T8-L1 posterior spinal instrumented fusion. He was recovered in PACU and transferred to 3W. His hospital course was notable for pre-renal azotemia that resolved to his baseline creatinine and some hyperglycemia that improved with starting his home regimen. He was seen by PT/OT, who recommended SNF but the patient preferred to return home with home health. The patient was discharged home on 12/16/2018. He will follow up in clinic with me in 2 weeks.  Neurologic exam at discharge:  AOx3, PERRL, EOMI, FS, TM Strength 5/5 x4, SILTx4  Discharge diagnosis: Unstable thoracic spine fracture  Judith Part, MD 12/14/18 8:35 AM

## 2018-12-14 NOTE — Progress Notes (Addendum)
Pt's SPO2 trended down to 85%. Put on 1 liter of oxygen. Saturating at 95%.

## 2018-12-15 DIAGNOSIS — L8990 Pressure ulcer of unspecified site, unspecified stage: Secondary | ICD-10-CM | POA: Insufficient documentation

## 2018-12-15 LAB — GLUCOSE, CAPILLARY
Glucose-Capillary: 156 mg/dL — ABNORMAL HIGH (ref 70–99)
Glucose-Capillary: 179 mg/dL — ABNORMAL HIGH (ref 70–99)
Glucose-Capillary: 174 mg/dL — ABNORMAL HIGH (ref 70–99)
Glucose-Capillary: 195 mg/dL — ABNORMAL HIGH (ref 70–99)

## 2018-12-15 MED ORDER — HEPARIN SODIUM (PORCINE) 5000 UNIT/ML IJ SOLN
5000.0000 [IU] | Freq: Three times a day (TID) | INTRAMUSCULAR | Status: DC
  Administered 2018-12-15 – 2018-12-16 (×4): 5000 [IU] via SUBCUTANEOUS
  Filled 2018-12-15 (×5): qty 1

## 2018-12-15 NOTE — Evaluation (Signed)
Occupational Therapy Evaluation Patient Details Name: Isaiah ReinRobert W Sliwa MRN: [id number] DOB: [birth year] Today's Date: 12/15/2018    History of Present Illness 78 yo male with onset of fall at home was admitted and noted spontaneous ankylosis and at T10-11 a likely fracture through the anterior osteophytes, posterior small epidural hematoma, posterior vertebral osteophytes, and was given T8-L1 posterior instrumented fusion.  Pt is noted to be sundowning in the evening.  PMHx:  a-fib, CHF, DM, DVT, carotid artery disease, NSTEMI, stroke and PVD   Clinical Impression   PTA patient reports independent with ADLs and mobility using cane. Admitted for above and limited by problem list below, including back pain, impaired balance, body habitus, decreased activity tolerance, generalized weakness, and impaired cognition.  Patient currently requires mod assist +2 for bed mobility, max assist +2 for transfers and toileting standing, min assist for UB ADLs and max assist for LB ADLs.  Patient educated on back precautions and ADL compensatory techniques, but requires cueing throughout session for posture and techniques.  Patient will benefit from continued OT services while admitted and after dc at SNF level in order to maximize independence and safety with ADLs/ mobility prior to dc home.     Follow Up Recommendations  SNF;Supervision/Assistance - 24 hour    Equipment Recommendations  3 in 1 bedside commode    Recommendations for Other Services PT consult(as ordered)     Precautions / Restrictions Precautions Precautions: Fall Precaution Comments: monitor O2 sats Required Braces or Orthoses: (no brace needed order) Restrictions Weight Bearing Restrictions: No      Mobility Bed Mobility Overal bed mobility: Needs Assistance Bed Mobility: Sidelying to Sit;Rolling Rolling: Mod assist Sidelying to sit: Mod assist;+2 for physical assistance;+2 for safety/equipment Supine to sit: +2 for physical  assistance;+2 for safety/equipment;Mod assist     General bed mobility comments: mod to roll and mod of 2 to sit up from side  Transfers Overall transfer level: Needs assistance Equipment used: Rolling walker (2 wheeled);2 person hand held assist Transfers: Sit to/from Stand Sit to Stand: Max assist;+2 safety/equipment;+2 physical assistance         General transfer comment: +2 assist to power up to EOB with max assist to maintain balance while using urinal    Balance Overall balance assessment: Needs assistance Sitting-balance support: Feet supported Sitting balance-Leahy Scale: Fair     Standing balance support: Bilateral upper extremity supported;During functional activity Standing balance-Leahy Scale: Poor Standing balance comment: relaint on B UE support, used urinal in standing with 0-1 hand support with heavy R lateral lean                            ADL either performed or assessed with clinical judgement   ADL Overall ADL's : Needs assistance/impaired     Grooming: Set up;Sitting   Upper Body Bathing: Set up;Sitting   Lower Body Bathing: Moderate assistance;Sit to/from stand Lower Body Bathing Details (indicate cue type and reason): requires assist for LB at baseline  Upper Body Dressing : Minimal assistance;Sitting   Lower Body Dressing: Maximal assistance;Sit to/from stand;+2 for safety/equipment Lower Body Dressing Details (indicate cue type and reason): requires assist for LB at baseline  Toilet Transfer: Maximal assistance;+2 for physical assistance;+2 for safety/equipment;Ambulation;RW   Toileting- Clothing Manipulation and Hygiene: Maximal assistance;+2 for physical assistance;+2 for safety/equipment;Sit to/from stand Toileting - Clothing Manipulation Details (indicate cue type and reason): toileting in standing using urinal with heavy R sided lateral lean  Functional mobility during ADLs: Rolling walker General ADL Comments: pt limited  by pain, precautions, balance      Vision         Perception     Praxis      Pertinent Vitals/Pain Pain Assessment: Faces Faces Pain Scale: Hurts little more Pain Location: back Pain Descriptors / Indicators: Operative site guarding Pain Intervention(s): Limited activity within patient's tolerance;Monitored during session;Repositioned     Hand Dominance Right   Extremity/Trunk Assessment Upper Extremity Assessment Upper Extremity Assessment: Generalized weakness   Lower Extremity Assessment Lower Extremity Assessment: Defer to PT evaluation   Cervical / Trunk Assessment Cervical / Trunk Assessment: Other exceptions Cervical / Trunk Exceptions: s/p back surgery   Communication Communication Communication: HOH   Cognition Arousal/Alertness: Awake/alert Behavior During Therapy: Flat affect Overall Cognitive Status: History of cognitive impairments - at baseline                                 General Comments: pt able to follow commands with increasd time, poor recall of back precautions    General Comments  on 1L upon entry, removed during ADLs with noted decreased saturation level to 88% recoved with O2 reapplied    Exercises Exercises: Other exercises(Functional strength is 4- to 4)   Shoulder Instructions      Home Living Family/patient expects to be discharged to:: Private residence Living Arrangements: Children Available Help at Discharge: Family;Available PRN/intermittently Type of Home: House Home Access: Ramped entrance     Home Layout: Two level;Able to live on main level with bedroom/bathroom     Bathroom Shower/Tub: Walk-in shower   Bathroom Toilet: Handicapped height     Home Equipment: Environmental consultant - 2 wheels;Cane - single point          Prior Functioning/Environment Level of Independence: Independent with assistive device(s)        Comments: used cane, independent ADLs         OT Problem List: Decreased  strength;Decreased activity tolerance;Impaired balance (sitting and/or standing);Decreased safety awareness;Decreased knowledge of use of DME or AE;Pain;Decreased cognition;Decreased coordination;Decreased knowledge of precautions      OT Treatment/Interventions: Self-care/ADL training;Therapeutic exercise;DME and/or AE instruction;Therapeutic activities;Cognitive remediation/compensation;Balance training;Patient/family education    OT Goals(Current goals can be found in the care plan section) Acute Rehab OT Goals Patient Stated Goal: to get stronger and get home OT Goal Formulation: With patient Time For Goal Achievement: 12/29/18 Potential to Achieve Goals: Good  OT Frequency: Min 2X/week   Barriers to D/C: Decreased caregiver support          Co-evaluation PT/OT/SLP Co-Evaluation/Treatment: Yes Reason for Co-Treatment: Complexity of the patient's impairments (multi-system involvement);Necessary to address cognition/behavior during functional activity;For patient/therapist safety;To address functional/ADL transfers PT goals addressed during session: Mobility/safety with mobility;Balance;Proper use of DME OT goals addressed during session: ADL's and self-care      AM-PAC OT "6 Clicks" Daily Activity     Outcome Measure Help from another person eating meals?: A Little Help from another person taking care of personal grooming?: A Little Help from another person toileting, which includes using toliet, bedpan, or urinal?: A Lot Help from another person bathing (including washing, rinsing, drying)?: A Lot Help from another person to put on and taking off regular upper body clothing?: A Little Help from another person to put on and taking off regular lower body clothing?: A Lot 6 Click Score: 15   End of Session Equipment  Utilized During Treatment: Gait belt;Rolling walker;Oxygen(1L) Nurse Communication: Mobility status;Precautions  Activity Tolerance: Patient tolerated treatment  well Patient left: in chair;with call bell/phone within reach;with chair alarm set  OT Visit Diagnosis: Other abnormalities of gait and mobility (R26.89);Muscle weakness (generalized) (M62.81);Pain Pain - part of body: (back)                Time: 1041-1110 OT Time Calculation (min): 29 min Charges:  OT General Charges $OT Visit: 1 Visit OT Evaluation $OT Eval Moderate Complexity: 1 Mod  Chancy Milroyhristie S Nijel Flink, OT Acute Rehabilitation Services Pager [phone] Office [phone]   Chancy MilroyChristie S Hernan Turnage 12/15/2018, 1:35 PM

## 2018-12-15 NOTE — Evaluation (Signed)
Physical Therapy Evaluation Patient Details Name: Isaiah ReinRobert W Crook MRN: [id number] DOB: 1940/10/06 Today's Date: 12/15/2018   History of Present Illness  78 yo male with onset of fall at home was admitted and noted spontaneous ankylosis and at T10-11 a likely fracture through the anterior osteophytes, posterior small epidural hematoma, posterior vertebral osteophytes, and was given T8-L1 posterior instrumented fusion.  Pt is noted to be sundowning in the evening.  PMHx:  a-fib, CHF, DM, DVT, carotid artery disease, NSTEMI, stroke and PVD    Clinical Impression  Pt was assessed with OT to determine his tolerance for movement, and to cue posture and body mechanics with bed mob and gait.  Pt is not familiar with precautions, and verbally and tactilely cued him for the correct sequence for eval.  Will continue on with dense training but expect him to be repetitively instructed with rehab referral to SNF.  Follow acutely to work on mobility with 2 person assist, to attempt stairs if able and focus on balance and control of posture with walker.    Follow Up Recommendations SNF    Equipment Recommendations  None recommended by PT    Recommendations for Other Services       Precautions / Restrictions Precautions Precautions: Fall Precaution Comments: monitor O2 sats Required Braces or Orthoses: (no back brace needed) Restrictions Weight Bearing Restrictions: No      Mobility  Bed Mobility Overal bed mobility: Needs Assistance Bed Mobility: Sidelying to Sit;Rolling Rolling: Mod assist Sidelying to sit: Mod assist;+2 for physical assistance;+2 for safety/equipment Supine to sit: +2 for physical assistance;+2 for safety/equipment;Mod assist     General bed mobility comments: mod to roll and mod of 2 to sit up from side  Transfers Overall transfer level: Needs assistance Equipment used: Rolling walker (2 wheeled);2 person hand held assist Transfers: Sit to/from Stand Sit to Stand: Max  assist;+2 safety/equipment;+2 physical assistance         General transfer comment: max to stand with 2 and maintain standing to use urinal  Ambulation/Gait Ambulation/Gait assistance: Mod assist;+2 physical assistance;+2 safety/equipment Gait Distance (Feet): 100 Feet Assistive device: Rolling walker (2 wheeled);1 person hand held assist;Pushed wheelchair Gait Pattern/deviations: Step-through pattern;Decreased stride length;Wide base of support;Trunk flexed(continual assist and cues for posture) Gait velocity: reduced Gait velocity interpretation: <1.31 ft/sec, indicative of household ambulator General Gait Details: pt is up to walk with belt around upper torso, has tolerated a longer distance with room air but dropped to 88% with gait, replaced cannula in room  Stairs            Wheelchair Mobility    Modified Rankin (Stroke Patients Only)       Balance Overall balance assessment: Needs assistance Sitting-balance support: Feet supported Sitting balance-Leahy Scale: Fair     Standing balance support: Bilateral upper extremity supported;During functional activity Standing balance-Leahy Scale: Poor                               Pertinent Vitals/Pain Pain Assessment: Faces Faces Pain Scale: Hurts little more Pain Location: spine to initiate movement Pain Descriptors / Indicators: Operative site guarding Pain Intervention(s): Limited activity within patient's tolerance;Monitored during session;Premedicated before session;Repositioned    Home Living Family/patient expects to be discharged to:: Private residence Living Arrangements: Children Available Help at Discharge: Family;Available PRN/intermittently Type of Home: House Home Access: Ramped entrance     Home Layout: Two level;Able to live on main level with bedroom/bathroom  Home Equipment: [place] - 2 wheels;Cane - single point      Prior Function Level of Independence: Independent with assistive  device(s)         Comments: used cane, independent ADLs      Hand Dominance   Dominant Hand: Right    Extremity/Trunk Assessment   Upper Extremity Assessment Upper Extremity Assessment: Defer to OT evaluation    Lower Extremity Assessment Lower Extremity Assessment: Generalized weakness    Cervical / Trunk Assessment Cervical / Trunk Assessment: Other exceptions(new T8 to L1 fusion posteriorly)  Communication   Communication: HOH  Cognition Arousal/Alertness: Awake/alert Behavior During Therapy: Flat affect Overall Cognitive Status: History of cognitive impairments - at baseline                                 General Comments: history of dementia      General Comments General comments (skin integrity, edema, etc.): Pt was able to walk with close guard chair but is continually needing assist for posture and reminders for safety.  Dropped sat on room air to 88% and will need supplemental O2, needs SNF    Exercises     Assessment/Plan    PT Assessment Patient needs continued PT services  PT Problem List Decreased strength;Decreased range of motion;Decreased activity tolerance;Decreased balance;Decreased mobility;Decreased coordination;Decreased cognition;Decreased knowledge of use of DME;Decreased safety awareness;Cardiopulmonary status limiting activity;Decreased knowledge of precautions;Decreased skin integrity;Pain       PT Treatment Interventions DME instruction;Gait training;Stair training;Functional mobility training;Therapeutic activities;Therapeutic exercise;Balance training;Neuromuscular re-education;Patient/family education    PT Goals (Current goals can be found in the Care Plan section)  Acute Rehab PT Goals Patient Stated Goal: to get stronger and get home PT Goal Formulation: With patient Time For Goal Achievement: 12/29/18 Potential to Achieve Goals: Good    Frequency Min 2X/week   Barriers to discharge Inaccessible home  environment;Decreased caregiver support family not there at all times, has stairs in his home    Co-evaluation PT/OT/SLP Co-Evaluation/Treatment: Yes Reason for Co-Treatment: Complexity of the patient's impairments (multi-system involvement);For patient/therapist safety;Necessary to address cognition/behavior during functional activity PT goals addressed during session: Mobility/safety with mobility;Balance;Proper use of DME         AM-PAC PT "6 Clicks" Mobility  Outcome Measure Help needed turning from your back to your side while in a flat bed without using bedrails?: A Lot Help needed moving from lying on your back to sitting on the side of a flat bed without using bedrails?: A Lot Help needed moving to and from a bed to a chair (including a wheelchair)?: A Lot Help needed standing up from a chair using your arms (e.g., wheelchair or bedside chair)?: A Lot Help needed to walk in hospital room?: A Lot Help needed climbing 3-5 steps with a railing? : A Lot 6 Click Score: 12    End of Session Equipment Utilized During Treatment: Gait belt;Oxygen Activity Tolerance: Patient limited by fatigue;Treatment limited secondary to medical complications (Comment) Patient left: in chair;with call bell/phone within reach;with chair alarm set Nurse Communication: Mobility status PT Visit Diagnosis: Unsteadiness on feet (R26.81);Other abnormalities of gait and mobility (R26.89);Muscle weakness (generalized) (M62.81);Difficulty in walking, not elsewhere classified (R26.2)    Time: 4270-6237 PT Time Calculation (min) (ACUTE ONLY): 31 min   Charges:   PT Evaluation $PT Eval Moderate Complexity: 1 Mod PT Treatments $Gait Training: 8-22 mins       Ramond Dial 12/15/2018, 1:09  PM   Mee Hives, PT MS Acute Rehab Dept. Number: [facility] [place]

## 2018-12-15 NOTE — Care Management Important Message (Signed)
Important Message  Patient Details  Name: Isaiah [name] MRN: [id number] Date of Birth: [birth year]   Medicare Important Message Given:  Yes     [name] 12/15/2018, 4:21 PM

## 2018-12-15 NOTE — Progress Notes (Addendum)
Neurosurgery Service Progress Note  Subjective: No acute events overnight. Some sundowning / confusion with medications, back to baseline this morning, increasing soreness as he increases activity  Objective: Vitals:   12/14/18 2152 12/14/18 2304 12/15/18 0341 12/15/18 0834  BP: (!) 101/47 (!) 118/48 (!) 127/40 (!) 119/46  Pulse: (!) 59 62 (!) 51 (!) 57  Resp: 18 18 18 15   Temp: 99.1 F (37.3 C) 99.3 F (37.4 C) 98.9 F (37.2 C) 98.2 F (36.8 C)  TempSrc: Oral Oral Oral Oral  SpO2: 97% 93% 96% 96%  Weight:      Height:       Temp (24hrs), Avg:98.8 F (37.1 C), Min:98.2 F (36.8 C), Max:99.3 F (37.4 C)  CBC Latest Ref Rng & Units 12/12/2018 08/23/2016 08/22/2016  WBC 4.0 - 10.5 K/uL 6.9 7.2 8.2  Hemoglobin 13.0 - 17.0 g/dL 11.6(L) 10.1(L) 10.4(L)  Hematocrit 39.0 - 52.0 % 36.7(L) 32.9(L) 32.8(L)  Platelets 150 - 400 K/uL 196 279 275   BMP Latest Ref Rng & Units 12/14/2018 12/12/2018 08/28/2016  Glucose 70 - 99 mg/dL 190(H) 215(H) 133(H)  BUN 8 - 23 mg/dL 28(H) 22 24  Creatinine 0.61 - 1.24 mg/dL 1.64(H) 1.36(H) 1.77(H)  Sodium 135 - 145 mmol/L 138 135 138  Potassium 3.5 - 5.1 mmol/L 3.7 3.6 3.6  Chloride 98 - 111 mmol/L 101 98 97(L)  CO2 22 - 32 mmol/L 27 28 28   Calcium 8.9 - 10.3 mg/dL 8.6(L) 8.8(L) 9.5    Intake/Output Summary (Last 24 hours) at 12/15/2018 0935 Last data filed at 12/15/2018 0830 Gross per 24 hour  Intake 1209.92 ml  Output 450 ml  Net 759.92 ml    Current Facility-Administered Medications:  .  0.9 %  sodium chloride infusion, , Intravenous, Continuous, Chloe Miyoshi, Joyice Faster, MD, Last Rate: 75 mL/hr at 12/14/18 1455 .  acetaminophen (TYLENOL) tablet 650 mg, 650 mg, Oral, Q4H PRN, 650 mg at 12/14/18 2201 **OR** acetaminophen (TYLENOL) suppository 650 mg, 650 mg, Rectal, Q4H PRN, Judith Part, MD .  allopurinol (ZYLOPRIM) tablet 100 mg, 100 mg, Oral, BID, Judith Part, MD, 100 mg at 12/15/18 0929 .  docusate sodium (COLACE) capsule 100 mg, 100  mg, Oral, BID, Toneshia Coello A, MD, 100 mg at 12/15/18 0930 .  furosemide (LASIX) tablet 40 mg, 40 mg, Oral, BID, Wilbur Oakland, Joyice Faster, MD, 40 mg at 12/15/18 0830 .  gabapentin (NEURONTIN) capsule 100 mg, 100 mg, Oral, TID, Judith Part, MD, 100 mg at 12/15/18 0930 .  glipiZIDE (GLUCOTROL) tablet 5 mg, 5 mg, Oral, BID AC, Cyprian Gongaware A, MD, 5 mg at 12/15/18 4315 .  hydrALAZINE (APRESOLINE) tablet 100 mg, 100 mg, Oral, BID, Judith Part, MD, 100 mg at 12/15/18 0930 .  HYDROmorphone (DILAUDID) injection 0.5 mg, 0.5 mg, Intravenous, Q3H PRN, Judith Part, MD, 0.5 mg at 12/15/18 0645 .  insulin aspart (novoLOG) injection 0-15 Units, 0-15 Units, Subcutaneous, TID WC, Judith Part, MD, 3 Units at 12/15/18 0645 .  insulin aspart (novoLOG) injection 0-5 Units, 0-5 Units, Subcutaneous, QHS, Judith Part, MD, 5 Units at 12/13/18 2259 .  isosorbide mononitrate (IMDUR) 24 hr tablet 60 mg, 60 mg, Oral, Daily, Judith Part, MD, 60 mg at 12/15/18 0929 .  lactated ringers infusion, , Intravenous, Continuous, Jahaziel Francois, Joyice Faster, MD, Last Rate: 10 mL/hr at 12/13/18 0820 .  menthol-cetylpyridinium (CEPACOL) lozenge 3 mg, 1 lozenge, Oral, PRN **OR** phenol (CHLORASEPTIC) mouth spray 1 spray, 1 spray, Mouth/Throat, PRN, Tavaria Mackins, Joyice Faster,  MD .  mupirocin ointment (BACTROBAN) 2 % 1 application, 1 application, Nasal, BID, Magdala Brahmbhatt, Clovis Puhomas A, MD, 1 application at 12/15/18 0930 .  ondansetron (ZOFRAN) tablet 4 mg, 4 mg, Oral, Q6H PRN **OR** ondansetron (ZOFRAN) injection 4 mg, 4 mg, Intravenous, Q6H PRN, Angle Karel A, MD .  oxyCODONE (Oxy IR/ROXICODONE) immediate release tablet 10 mg, 10 mg, Oral, Q4H PRN, Jadene Pierinistergard, Justus Duerr A, MD, 10 mg at 12/15/18 0510 .  oxyCODONE (Oxy IR/ROXICODONE) immediate release tablet 5 mg, 5 mg, Oral, Q4H PRN, Yanelly Cantrelle A, MD .  pantoprazole (PROTONIX) EC tablet 40 mg, 40 mg, Oral, BID AC, Jadene Pierinistergard, Chantavia Bazzle A, MD, 40 mg at  12/15/18 0647 .  potassium chloride (K-DUR) CR tablet 10 mEq, 10 mEq, Oral, Daily, Jadene Pierinistergard, Velina Drollinger A, MD, 10 mEq at 12/15/18 0929 .  pravastatin (PRAVACHOL) tablet 40 mg, 40 mg, Oral, QHS, Jadene Pierinistergard, Tuvia Woodrick A, MD, 40 mg at 12/14/18 2202 .  QUEtiapine (SEROQUEL) tablet 50 mg, 50 mg, Oral, Q supper, Jadene Pierinistergard, Skila Rollins A, MD, 50 mg at 12/14/18 1742   Physical Exam: AOx3, PERRL, EOMI, FS, Strength 5/5 x4, SILTx4 Incision c/d/i with dressing  Assessment & Plan: 78 y.o. man w/ likely AS vs DISH s/p fall with 3 column thoracic fracture s/p posterior instrumented fusion, recovering well. Post-op uprights show hardware in good position  Neuro -XR reviewed, show hardware in good position, stable 5mm anterolisthesis at fracture site, which is stable from intra-op -cont home gabapentin, quetiapine -I don't think we should increase his opiates given his delirium qhs  Cardiopulm -cont home CPAP -cont home imdur 60qd, hydralazine 100bid  FENGI -cont home allopurinol, furosemide 40bid -BG improved, keep current regimen -Renal function appears to be near baseline, Cr from 2018 1.78  Heme/ID  -warfarin (for PAF) on hold until 12/20/2018  Dispo/PPx -PT/OT eval -SCDs/TEDs, SQH -possible discharge tomorrow pending above  Jadene Pierinihomas A Basia Mcginty  12/15/18 9:35 AM

## 2018-12-16 LAB — GLUCOSE, CAPILLARY
Glucose-Capillary: 133 mg/dL — ABNORMAL HIGH (ref 70–99)
Glucose-Capillary: 109 mg/dL — ABNORMAL HIGH (ref 70–99)
Glucose-Capillary: 232 mg/dL — ABNORMAL HIGH (ref 70–99)

## 2018-12-16 LAB — OCCULT BLOOD X 1 CARD TO LAB, STOOL: Fecal Occult Bld: POSITIVE — AB

## 2018-12-16 MED ORDER — OXYCODONE HCL 5 MG PO TABS: 5.0000 mg | ORAL_TABLET | ORAL | 0 refills | Status: DC | PRN

## 2018-12-16 MED ORDER — DOCUSATE SODIUM 100 MG PO CAPS: 100.0000 mg | ORAL_CAPSULE | Freq: Two times a day (BID) | ORAL | 0 refills | Status: DC

## 2018-12-16 NOTE — TOC Initial Note (Signed)
Transition of Care [facility]) - Initial/Assessment Note    Patient Details  Name: Isaiah [name] MRN: [id number] Date of Birth: [birth year]  Transition [facility]) CM/SW Contact:    Pollie Friar, RN Phone Number: 12/16/2018, 2:46 PM  Clinical Narrative:                 CM met with the patient and his daughter at the bedside. They prefer for patient to d/c home when medically ready. Pt will have 24 hour supervision and support.  DME ordered to be delivered to the room. Daughter requesting [facility] for home therapies.  Daughter to provide transport home when medically ready.  Expected Discharge Plan: [place] Barriers to Discharge: Continued Medical Work up   Patient Goals and CMS Choice   CMS Medicare.gov Compare Post Acute Care list provided to:: Patient Represenative (must comment) Choice offered to / list presented to : Adult Children(daughter)  Expected Discharge Plan and Services Expected Discharge Plan: [place]   Discharge Planning Services: CM Consult Post Acute Care Choice: Home Health, Durable Medical Equipment Living arrangements for the past 2 months: Single Family Home                 DME Arranged: 3-N-1, Walker rolling DME Agency: [employer] Date DME Agency Contacted: 12/16/18   Representative spoke with at DME Agency: Zack HH Arranged: PT, OT [place] Agency: [place]        Prior Living Arrangements/Services Living arrangements for the past 2 months: [place] Lives with:: Adult Children Patient language and need for interpreter reviewed:: Yes(no needs) Do you feel safe going back to the place where you live?: Yes      Need for Family Participation in Patient Care: Yes (Comment)(24 hour supervision) Care giver support system in place?: Yes (comment)(daughter and other family to provide 24 hour supervision and support) Current home services: DME(cane) Criminal Activity/Legal Involvement  Pertinent to Current Situation/Hospitalization: No - Comment as needed  Activities of Daily Living Home Assistive Devices/Equipment: CBG Meter, CPAP, Dentures (specify type), Hearing aid ADL Screening (condition at time of admission) Patient's cognitive ability adequate to safely complete daily activities?: Yes Is the patient deaf or have difficulty hearing?: Yes Does the patient have difficulty seeing, even when wearing glasses/contacts?: No Does the patient have difficulty concentrating, remembering, or making decisions?: No Patient able to express need for assistance with ADLs?: Yes Does the patient have difficulty dressing or bathing?: No Independently performs ADLs?: Yes (appropriate for developmental age) Does the patient have difficulty walking or climbing stairs?: Yes Weakness of Legs: None Weakness of Arms/Hands: None  Permission Sought/Granted                  Emotional Assessment Appearance:: Appears stated age Attitude/Demeanor/Rapport: Engaged Affect (typically observed): Accepting Orientation: : Oriented to Self, Oriented to Place, Oriented to  Time   Psych Involvement: No (comment)  Admission diagnosis:  SPINE FRACTURE Patient Active Problem List   Diagnosis Date Noted  . Pressure injury of skin 12/15/2018  . Thoracic spine fracture ([place]) 12/12/2018  . Hyponatremia 08/22/2016  . ARF (acute renal failure) ([place]) 08/21/2016  . Hypokalemia 08/11/2016  . Occult GI bleeding 08/11/2016  . General weakness 08/11/2016  . GI bleed 08/11/2016  . Weakness   . Non-ST elevation (NSTEMI) myocardial infarction ([place]) 08/08/2016  . Unstable angina ([place]) 08/06/2016  . Chest pain 08/05/2016  . CKD (chronic kidney disease), stage III (  [place]) 08/05/2016  . Normochromic normocytic anemia 08/05/2016  . Pain in joint, lower leg 10/01/2014  . Coronary artery disease 04/27/2014  . Chronic atrial fibrillation 04/27/2014  . Essential hypertension 04/27/2014  . Hyperlipidemia  04/27/2014  . Type 2 diabetes mellitus with vascular disease ([place]) 04/27/2014  . Obstructive sleep apnea 04/27/2014  . Stroke ([place]) 04/27/2014  . On continuous oral anticoagulation 04/27/2014  . Occlusion and stenosis of carotid artery without mention of cerebral infarction 02/04/2013  . Aftercare following surgery of the circulatory system, [place] 01/09/2012  . Chronic total occlusion of artery of the extremities ([place]) 01/09/2012   PCP:  [name], [name], MD Pharmacy:   [facility], [place] - [address] [place]  [address] Phone: [phone] Fax: [phone]     Social Determinants of Health (SDOH) Interventions    Readmission Risk Interventions No flowsheet data found.

## 2018-12-16 NOTE — Progress Notes (Signed)
Pt d/c home. No new concerns pt is stable. Occult blood positive. D/c instructions given to patient and reviewed with daughter. Pt/daughter verbalize understanding. Pt is transported out of hospital by family.

## 2018-12-16 NOTE — Progress Notes (Signed)
Physical Therapy Treatment Patient Details Name: Isaiah [name] MRN: [id number] DOB: 04-27-1941 Today's Date: 12/16/2018    History of Present Illness 78 yo male with onset of fall at home was admitted and noted spontaneous ankylosis and at T10-11 a likely fracture through the anterior osteophytes, posterior small epidural hematoma, posterior vertebral osteophytes, and was given T8-L1 posterior instrumented fusion.  Pt is noted to be sundowning in the evening.  PMHx:  a-fib, CHF, DM, DVT, carotid artery disease, NSTEMI, stroke and PVD    PT Comments    Pt progressing towards goals. Continues to be limited by fatigue. Required min to mod A +2 for safety and stability throughout mobility tasks this session. Pt unable to maintain static standing without external assist or use of BUE. Feel current recommendations appropriate, however, if pt and family refuses, feel he will require max HH services and 24/7 assist. Will continue to follow acutely to maximize functional mobility independence and safety.     Follow Up Recommendations  SNF;Supervision/Assistance - 24 hour(if family refuses will need max HH services)     Equipment Recommendations  Rolling walker with 5" wheels    Recommendations for Other Services       Precautions / Restrictions Precautions Precautions: Fall;Back Precaution Booklet Issued: No Precaution Comments: Reviewed back precautions during session. Will need to give handout during next session.  Restrictions Weight Bearing Restrictions: No    Mobility  Bed Mobility               General bed mobility comments: In chair upon entry.   Transfers Overall transfer level: Needs assistance Equipment used: Rolling walker (2 wheeled) Transfers: Sit to/from Stand Sit to Stand: Min assist;Mod assist;+2 physical assistance;+2 safety/equipment         General transfer comment: Min to mod A +2 for lift assist and steadying to stand. Cues for safe hand placement. Once  standing, pt requiring min A to maintain standing balance when not holding onto RW.   Ambulation/Gait Ambulation/Gait assistance: Min assist;+2 safety/equipment Gait Distance (Feet): 20 Feet Assistive device: Rolling walker (2 wheeled) Gait Pattern/deviations: Step-through pattern;Decreased stride length;Wide base of support;Trunk flexed Gait velocity: Decreased   General Gait Details: Slow, unsteady gait. Pt fatiguing easily and requiring min A for steadying throughout. Required continuous cues for upright posture and proximity to device.    Stairs             Wheelchair Mobility    Modified Rankin (Stroke Patients Only)       Balance Overall balance assessment: Needs assistance Sitting-balance support: Feet supported Sitting balance-Leahy Scale: Fair     Standing balance support: No upper extremity supported;Bilateral upper extremity supported;During functional activity Standing balance-Leahy Scale: Poor Standing balance comment: Reliant on BUE support and external support for balance                            Cognition Arousal/Alertness: Awake/alert Behavior During Therapy: Flat affect Overall Cognitive Status: History of cognitive impairments - at baseline                                        Exercises      General Comments General comments (skin integrity, edema, etc.): Pt requiring assist for clean up following BM.       Pertinent Vitals/Pain Pain Assessment: Faces Faces Pain Scale: Hurts even more  Pain Location: back Pain Descriptors / Indicators: Aching;Operative site guarding Pain Intervention(s): Limited activity within patient's tolerance;Monitored during session;Repositioned    Home Living                      Prior Function            PT Goals (current goals can now be found in the care plan section) Acute Rehab PT Goals Patient Stated Goal: to get stronger and get home PT Goal Formulation: With  patient Time For Goal Achievement: 12/29/18 Potential to Achieve Goals: Good Progress towards PT goals: Progressing toward goals    Frequency    Min 5X/week      PT Plan Frequency needs to be updated    Co-evaluation              AM-PAC PT "6 Clicks" Mobility   Outcome Measure  Help needed turning from your back to your side while in a flat bed without using bedrails?: A Lot Help needed moving from lying on your back to sitting on the side of a flat bed without using bedrails?: A Lot Help needed moving to and from a bed to a chair (including a wheelchair)?: A Little Help needed standing up from a chair using your arms (e.g., wheelchair or bedside chair)?: A Lot Help needed to walk in hospital room?: A Little Help needed climbing 3-5 steps with a railing? : A Lot 6 Click Score: 14    End of Session Equipment Utilized During Treatment: Gait belt Activity Tolerance: Patient limited by fatigue;Patient limited by pain Patient left: in chair;with call bell/phone within reach;with chair alarm set Nurse Communication: Mobility status;Other (comment)(pt had BM ) PT Visit Diagnosis: Unsteadiness on feet (R26.81);Other abnormalities of gait and mobility (R26.89);Muscle weakness (generalized) (M62.81);Difficulty in walking, not elsewhere classified (R26.2)     Time: 4235-3614 PT Time Calculation (min) (ACUTE ONLY): 23 min  Charges:  $Gait Training: 8-22 mins $Therapeutic Activity: 8-22 mins                     Leighton Ruff, PT, DPT  Acute Rehabilitation Services  Pager: [phone] Office: [phone]    Rudean Hitt 12/16/2018, 5:20 PM

## 2018-12-16 NOTE — Discharge Instructions (Signed)
Discharge Instructions ° °No restriction in activities, slowly increase your activity back to normal.  ° °Your incision is closed with staples. We will remove these in clinic in 2 weeks at your follow up appointment.  ° °Okay to shower on the day of discharge. Use regular soap and water and try to be gentle when cleaning your incision. No need for a dressing on your incision. In the first few days after surgery, there may be some bloody drainage from your wound. If this happens, you can cover your incision with a gauze dressing to prevent it from staining your clothes or bed linens. If your incision begins to itch, rub some bacitracin or neosporin ointment on it instead of scratching it. ° °Follow up with Dr. Jeryl Umholtz in 2 weeks after discharge. If you do not already have a discharge appointment, please call his office at [phone] to schedule a follow up appointment. If you have any concerns or questions, please call the office and let us know. °

## 2018-12-16 NOTE — Progress Notes (Signed)
Neurosurgery Service Progress Note  Subjective: No acute events overnight, no new complaints  Objective: Vitals:   12/15/18 1933 12/16/18 0012 12/16/18 0335 12/16/18 0842  BP: (!) 120/59 122/61 (!) 129/57 (!) 112/47  Pulse: 60 67 61 (!) 47  Resp: 18 18 18 20   Temp: 98.1 F (36.7 C) 98 F (36.7 C) 98.1 F (36.7 C) 98.9 F (37.2 C)  TempSrc: Oral Oral Oral Oral  SpO2: 94% 96% 98% 98%  Weight:      Height:       Temp (24hrs), Avg:98.3 F (36.8 C), Min:98 F (36.7 C), Max:98.9 F (37.2 C)  CBC Latest Ref Rng & Units 12/12/2018 08/23/2016 08/22/2016  WBC 4.0 - 10.5 K/uL 6.9 7.2 8.2  Hemoglobin 13.0 - 17.0 g/dL 11.6(L) 10.1(L) 10.4(L)  Hematocrit 39.0 - 52.0 % 36.7(L) 32.9(L) 32.8(L)  Platelets 150 - 400 K/uL 196 279 275   BMP Latest Ref Rng & Units 12/14/2018 12/12/2018 08/28/2016  Glucose 70 - 99 mg/dL 409(W190(H) 119(J215(H) 478(G133(H)  BUN 8 - 23 mg/dL 95(A28(H) 22 24  Creatinine 0.61 - 1.24 mg/dL 2.13(Y1.64(H) 8.65(H1.36(H) 8.46(N1.77(H)  Sodium 135 - 145 mmol/L 138 135 138  Potassium 3.5 - 5.1 mmol/L 3.7 3.6 3.6  Chloride 98 - 111 mmol/L 101 98 97(L)  CO2 22 - 32 mmol/L 27 28 28   Calcium 8.9 - 10.3 mg/dL 6.2(X8.6(L) 5.2(W8.8(L) 9.5    Intake/Output Summary (Last 24 hours) at 12/16/2018 1010 Last data filed at 12/16/2018 0013 Gross per 24 hour  Intake 530 ml  Output 500 ml  Net 30 ml    Current Facility-Administered Medications:  .  0.9 %  sodium chloride infusion, , Intravenous, Continuous, Jadene Pierinistergard, Sarahelizabeth Conway A, MD, Last Rate: 75 mL/hr at 12/15/18 2007 .  acetaminophen (TYLENOL) tablet 650 mg, 650 mg, Oral, Q4H PRN, 650 mg at 12/16/18 0134 **OR** acetaminophen (TYLENOL) suppository 650 mg, 650 mg, Rectal, Q4H PRN, Jadene Pierinistergard, Kesia Dalto A, MD .  allopurinol (ZYLOPRIM) tablet 100 mg, 100 mg, Oral, BID, Jadene Pierinistergard, Chico Cawood A, MD, 100 mg at 12/16/18 0952 .  docusate sodium (COLACE) capsule 100 mg, 100 mg, Oral, BID, Jadene Pierinistergard, Nema Oatley A, MD, 100 mg at 12/16/18 0952 .  furosemide (LASIX) tablet 40 mg, 40 mg, Oral, BID, Jadene Pierinistergard,  Vangie Henthorn A, MD, 40 mg at 12/16/18 0952 .  gabapentin (NEURONTIN) capsule 100 mg, 100 mg, Oral, TID, Jadene Pierinistergard, Ekaterini Capitano A, MD, 100 mg at 12/16/18 0952 .  glipiZIDE (GLUCOTROL) tablet 5 mg, 5 mg, Oral, BID AC, Cleavon Goldman A, MD, 5 mg at 12/16/18 0634 .  heparin injection 5,000 Units, 5,000 Units, Subcutaneous, Q8H, Jadene Pierinistergard, Deanta Mincey A, MD, 5,000 Units at 12/16/18 [phone] .  hydrALAZINE (APRESOLINE) tablet 100 mg, 100 mg, Oral, BID, Jadene Pierinistergard, Chrisangel Eskenazi A, MD, 100 mg at 12/16/18 0952 .  HYDROmorphone (DILAUDID) injection 0.5 mg, 0.5 mg, Intravenous, Q3H PRN, Jadene Pierinistergard, Yazlin Ekblad A, MD, 0.5 mg at 12/15/18 1234 .  insulin aspart (novoLOG) injection 0-15 Units, 0-15 Units, Subcutaneous, TID WC, Jadene Pierinistergard, Krishauna Schatzman A, MD, 2 Units at 12/16/18 [id number] .  insulin aspart (novoLOG) injection 0-5 Units, 0-5 Units, Subcutaneous, QHS, Jadene Pierinistergard, Reesha Debes A, MD, 5 Units at 12/13/18 2259 .  isosorbide mononitrate (IMDUR) 24 hr tablet 60 mg, 60 mg, Oral, Daily, Jadene Pierinistergard, Jaiveer Panas A, MD, 60 mg at 12/16/18 0952 .  lactated ringers infusion, , Intravenous, Continuous, Georgia Baria, Clovis Puhomas A, MD, Last Rate: 10 mL/hr at 12/13/18 0820 .  menthol-cetylpyridinium (CEPACOL) lozenge 3 mg, 1 lozenge, Oral, PRN **OR** phenol (CHLORASEPTIC) mouth spray 1 spray, 1 spray, Mouth/Throat, PRN, Eathan Groman,  Joyice Faster, MD .  mupirocin ointment (BACTROBAN) 2 % 1 application, 1 application, Nasal, BID, Judith Part, MD, 1 application at 65/03/54 [phone] .  ondansetron (ZOFRAN) tablet 4 mg, 4 mg, Oral, Q6H PRN **OR** ondansetron (ZOFRAN) injection 4 mg, 4 mg, Intravenous, Q6H PRN, Daesean Lazarz A, MD .  oxyCODONE (Oxy IR/ROXICODONE) immediate release tablet 10 mg, 10 mg, Oral, Q4H PRN, Judith Part, MD, 10 mg at 12/16/18 0254 .  oxyCODONE (Oxy IR/ROXICODONE) immediate release tablet 5 mg, 5 mg, Oral, Q4H PRN, Merla Sawka A, MD .  pantoprazole (PROTONIX) EC tablet 40 mg, 40 mg, Oral, BID AC, Judith Part, MD, 40 mg at 12/16/18 1275 .   potassium chloride (K-DUR) CR tablet 10 mEq, 10 mEq, Oral, Daily, Judith Part, MD, 10 mEq at 12/16/18 0952 .  pravastatin (PRAVACHOL) tablet 40 mg, 40 mg, Oral, QHS, Judith Part, MD, 40 mg at 12/15/18 2138 .  QUEtiapine (SEROQUEL) tablet 50 mg, 50 mg, Oral, Q supper, Judith Part, MD, 50 mg at 12/15/18 1637   Physical Exam: AOx3, PERRL, EOMI, FS, Strength 5/5 x4, SILTx4 Incision c/d/i with dressing  Assessment & Plan: 78 y.o. man w/ likely AS vs DISH s/p fall with 3 column thoracic fracture s/p posterior instrumented fusion, recovering well. Post-op uprights show hardware in good position.  Neuro -cont home gabapentin, quetiapine  Cardiopulm -cont home CPAP -cont home imdur 60qd, hydralazine 100bid  FENGI -cont home allopurinol, furosemide 40bid -BG improved, keep current regimen  Heme/ID  -warfarin (for PAF) on hold until 12/20/2018  Dispo/PPx -PT/OT eval -SCDs/TEDs, SQH -PT rec SNF, will c/s SW for placement  Marcello Moores A Jenniah Bhavsar  12/16/18 10:10 AM

## 2018-12-20 IMAGING — DX DG CHEST 1V PORT
1 series · 1 of 1 positions shown · non-contrast
Comparison: Portable exam 4174 hours compared to 08/10/2016

CLINICAL DATA: Dark stools, ongoing weakness, anemia, shortness of
breath with exertion, history diabetes mellitus, hypertension,
atrial fibrillation, coronary artery disease post MI, CHF, prior
stroke, sleep apnea

EXAM:
PORTABLE CHEST 1 VIEW

[chest ap]
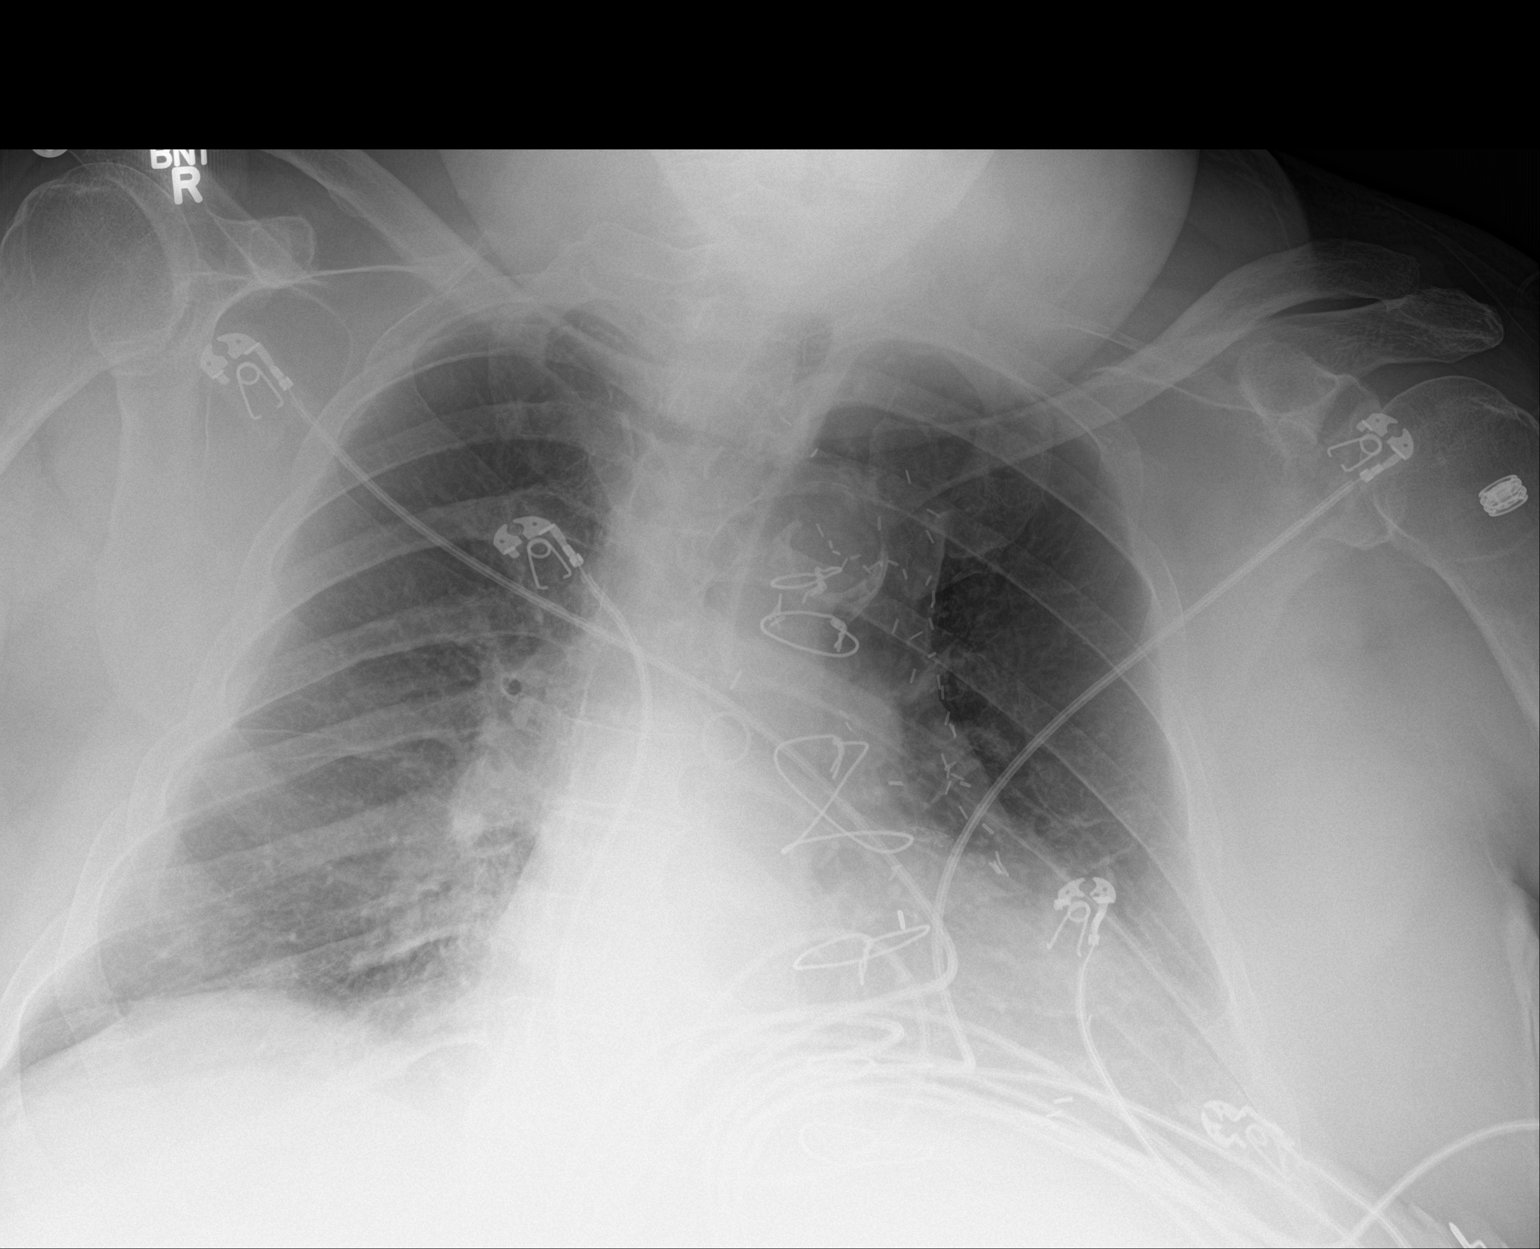

[1 of 1 positions shown; findings below may reference images not displayed]

FINDINGS: Rotated to the LEFT.

Upper normal heart size post CABG.

Atherosclerotic calcification aorta.

Mediastinal contours and pulmonary vascularity normal.

Bibasilar atelectasis without gross infiltrate, pleural effusion, or
pneumothorax.

Bones demineralized.
IMPRESSION: Post CABG with aortic atherosclerosis.

Bibasilar atelectasis.

## 2018-12-31 DIAGNOSIS — S22000A Wedge compression fracture of unspecified thoracic vertebra, initial encounter for closed fracture: Secondary | ICD-10-CM | POA: Insufficient documentation

## 2020-03-14 ENCOUNTER — Encounter (HOSPITAL_COMMUNITY): Payer: Self-pay | Admitting: Internal Medicine

## 2020-03-14 ENCOUNTER — Inpatient Hospital Stay (HOSPITAL_COMMUNITY)
Admission: EM | Admit: 2020-03-14 | Discharge: 2020-04-05 | DRG: 871 | Disposition: A | Discharge status: Home Health | Payer: Medicare Other | Attending: Internal Medicine | Admitting: Internal Medicine

## 2020-03-14 ENCOUNTER — Emergency Department (HOSPITAL_COMMUNITY): Payer: Medicare Other

## 2020-03-14 ENCOUNTER — Other Ambulatory Visit: Payer: Self-pay

## 2020-03-14 DIAGNOSIS — I2510 Atherosclerotic heart disease of native coronary artery without angina pectoris: Secondary | ICD-10-CM | POA: Diagnosis present

## 2020-03-14 DIAGNOSIS — I5021 Acute systolic (congestive) heart failure: Secondary | ICD-10-CM | POA: Diagnosis not present

## 2020-03-14 DIAGNOSIS — R6521 Severe sepsis with septic shock: Secondary | ICD-10-CM | POA: Diagnosis present

## 2020-03-14 DIAGNOSIS — K819 Cholecystitis, unspecified: Secondary | ICD-10-CM

## 2020-03-14 DIAGNOSIS — G4733 Obstructive sleep apnea (adult) (pediatric): Secondary | ICD-10-CM | POA: Diagnosis present

## 2020-03-14 DIAGNOSIS — A419 Sepsis, unspecified organism: Secondary | ICD-10-CM | POA: Diagnosis not present

## 2020-03-14 DIAGNOSIS — N183 Chronic kidney disease, stage 3 unspecified: Secondary | ICD-10-CM | POA: Diagnosis not present

## 2020-03-14 DIAGNOSIS — E860 Dehydration: Secondary | ICD-10-CM | POA: Diagnosis present

## 2020-03-14 DIAGNOSIS — K5731 Diverticulosis of large intestine without perforation or abscess with bleeding: Secondary | ICD-10-CM | POA: Diagnosis not present

## 2020-03-14 DIAGNOSIS — E861 Hypovolemia: Secondary | ICD-10-CM | POA: Diagnosis not present

## 2020-03-14 DIAGNOSIS — E785 Hyperlipidemia, unspecified: Secondary | ICD-10-CM | POA: Diagnosis present

## 2020-03-14 DIAGNOSIS — I5082 Biventricular heart failure: Secondary | ICD-10-CM

## 2020-03-14 DIAGNOSIS — E6609 Other obesity due to excess calories: Secondary | ICD-10-CM

## 2020-03-14 DIAGNOSIS — J9601 Acute respiratory failure with hypoxia: Secondary | ICD-10-CM | POA: Diagnosis not present

## 2020-03-14 DIAGNOSIS — L89312 Pressure ulcer of right buttock, stage 2: Secondary | ICD-10-CM | POA: Diagnosis present

## 2020-03-14 DIAGNOSIS — R0902 Hypoxemia: Secondary | ICD-10-CM

## 2020-03-14 DIAGNOSIS — N1832 Chronic kidney disease, stage 3b: Secondary | ICD-10-CM | POA: Diagnosis present

## 2020-03-14 DIAGNOSIS — I469 Cardiac arrest, cause unspecified: Secondary | ICD-10-CM | POA: Diagnosis not present

## 2020-03-14 DIAGNOSIS — I21A1 Myocardial infarction type 2: Secondary | ICD-10-CM | POA: Diagnosis not present

## 2020-03-14 DIAGNOSIS — I4901 Ventricular fibrillation: Secondary | ICD-10-CM | POA: Diagnosis not present

## 2020-03-14 DIAGNOSIS — N179 Acute kidney failure, unspecified: Secondary | ICD-10-CM | POA: Diagnosis present

## 2020-03-14 DIAGNOSIS — Z8249 Family history of ischemic heart disease and other diseases of the circulatory system: Secondary | ICD-10-CM

## 2020-03-14 DIAGNOSIS — I462 Cardiac arrest due to underlying cardiac condition: Secondary | ICD-10-CM | POA: Diagnosis not present

## 2020-03-14 DIAGNOSIS — G9341 Metabolic encephalopathy: Secondary | ICD-10-CM | POA: Diagnosis present

## 2020-03-14 DIAGNOSIS — I482 Chronic atrial fibrillation, unspecified: Secondary | ICD-10-CM | POA: Diagnosis present

## 2020-03-14 DIAGNOSIS — E878 Other disorders of electrolyte and fluid balance, not elsewhere classified: Secondary | ICD-10-CM | POA: Diagnosis present

## 2020-03-14 DIAGNOSIS — I4821 Permanent atrial fibrillation: Secondary | ICD-10-CM | POA: Diagnosis present

## 2020-03-14 DIAGNOSIS — R1011 Right upper quadrant pain: Secondary | ICD-10-CM | POA: Diagnosis present

## 2020-03-14 DIAGNOSIS — E1165 Type 2 diabetes mellitus with hyperglycemia: Secondary | ICD-10-CM | POA: Diagnosis not present

## 2020-03-14 DIAGNOSIS — K8000 Calculus of gallbladder with acute cholecystitis without obstruction: Secondary | ICD-10-CM | POA: Diagnosis present

## 2020-03-14 DIAGNOSIS — E871 Hypo-osmolality and hyponatremia: Secondary | ICD-10-CM | POA: Diagnosis not present

## 2020-03-14 DIAGNOSIS — D500 Iron deficiency anemia secondary to blood loss (chronic): Secondary | ICD-10-CM | POA: Diagnosis not present

## 2020-03-14 DIAGNOSIS — E875 Hyperkalemia: Secondary | ICD-10-CM | POA: Diagnosis not present

## 2020-03-14 DIAGNOSIS — I5043 Acute on chronic combined systolic (congestive) and diastolic (congestive) heart failure: Secondary | ICD-10-CM | POA: Diagnosis present

## 2020-03-14 DIAGNOSIS — R571 Hypovolemic shock: Secondary | ICD-10-CM | POA: Diagnosis not present

## 2020-03-14 DIAGNOSIS — E1151 Type 2 diabetes mellitus with diabetic peripheral angiopathy without gangrene: Secondary | ICD-10-CM | POA: Diagnosis present

## 2020-03-14 DIAGNOSIS — I130 Hypertensive heart and chronic kidney disease with heart failure and stage 1 through stage 4 chronic kidney disease, or unspecified chronic kidney disease: Secondary | ICD-10-CM | POA: Diagnosis present

## 2020-03-14 DIAGNOSIS — D649 Anemia, unspecified: Secondary | ICD-10-CM | POA: Diagnosis not present

## 2020-03-14 DIAGNOSIS — Z20822 Contact with and (suspected) exposure to covid-19: Secondary | ICD-10-CM | POA: Diagnosis present

## 2020-03-14 DIAGNOSIS — I493 Ventricular premature depolarization: Secondary | ICD-10-CM | POA: Diagnosis not present

## 2020-03-14 DIAGNOSIS — Z955 Presence of coronary angioplasty implant and graft: Secondary | ICD-10-CM

## 2020-03-14 DIAGNOSIS — R579 Shock, unspecified: Secondary | ICD-10-CM | POA: Diagnosis not present

## 2020-03-14 DIAGNOSIS — Z86718 Personal history of other venous thrombosis and embolism: Secondary | ICD-10-CM

## 2020-03-14 DIAGNOSIS — Z9119 Patient's noncompliance with other medical treatment and regimen: Secondary | ICD-10-CM

## 2020-03-14 DIAGNOSIS — Z8673 Personal history of transient ischemic attack (TIA), and cerebral infarction without residual deficits: Secondary | ICD-10-CM

## 2020-03-14 DIAGNOSIS — E873 Alkalosis: Secondary | ICD-10-CM | POA: Diagnosis present

## 2020-03-14 DIAGNOSIS — I10 Essential (primary) hypertension: Secondary | ICD-10-CM | POA: Diagnosis present

## 2020-03-14 DIAGNOSIS — K7460 Unspecified cirrhosis of liver: Secondary | ICD-10-CM | POA: Diagnosis present

## 2020-03-14 DIAGNOSIS — G9340 Encephalopathy, unspecified: Secondary | ICD-10-CM

## 2020-03-14 DIAGNOSIS — Z7901 Long term (current) use of anticoagulants: Secondary | ICD-10-CM

## 2020-03-14 DIAGNOSIS — R0602 Shortness of breath: Secondary | ICD-10-CM

## 2020-03-14 DIAGNOSIS — E1159 Type 2 diabetes mellitus with other circulatory complications: Secondary | ICD-10-CM | POA: Diagnosis not present

## 2020-03-14 DIAGNOSIS — J9602 Acute respiratory failure with hypercapnia: Secondary | ICD-10-CM | POA: Diagnosis not present

## 2020-03-14 DIAGNOSIS — A4181 Sepsis due to Enterococcus: Principal | ICD-10-CM | POA: Diagnosis present

## 2020-03-14 DIAGNOSIS — E1142 Type 2 diabetes mellitus with diabetic polyneuropathy: Secondary | ICD-10-CM | POA: Diagnosis present

## 2020-03-14 DIAGNOSIS — H9190 Unspecified hearing loss, unspecified ear: Secondary | ICD-10-CM | POA: Diagnosis present

## 2020-03-14 DIAGNOSIS — Z83438 Family history of other disorder of lipoprotein metabolism and other lipidemia: Secondary | ICD-10-CM

## 2020-03-14 DIAGNOSIS — E1122 Type 2 diabetes mellitus with diabetic chronic kidney disease: Secondary | ICD-10-CM | POA: Diagnosis present

## 2020-03-14 DIAGNOSIS — M109 Gout, unspecified: Secondary | ICD-10-CM | POA: Diagnosis present

## 2020-03-14 DIAGNOSIS — R339 Retention of urine, unspecified: Secondary | ICD-10-CM | POA: Diagnosis not present

## 2020-03-14 DIAGNOSIS — Z6833 Body mass index (BMI) 33.0-33.9, adult: Secondary | ICD-10-CM

## 2020-03-14 DIAGNOSIS — Z87891 Personal history of nicotine dependence: Secondary | ICD-10-CM

## 2020-03-14 DIAGNOSIS — Z4659 Encounter for fitting and adjustment of other gastrointestinal appliance and device: Secondary | ICD-10-CM

## 2020-03-14 DIAGNOSIS — R4189 Other symptoms and signs involving cognitive functions and awareness: Secondary | ICD-10-CM | POA: Diagnosis present

## 2020-03-14 DIAGNOSIS — Z01818 Encounter for other preprocedural examination: Secondary | ICD-10-CM

## 2020-03-14 DIAGNOSIS — L8990 Pressure ulcer of unspecified site, unspecified stage: Secondary | ICD-10-CM | POA: Diagnosis present

## 2020-03-14 DIAGNOSIS — D539 Nutritional anemia, unspecified: Secondary | ICD-10-CM | POA: Diagnosis not present

## 2020-03-14 DIAGNOSIS — Z79899 Other long term (current) drug therapy: Secondary | ICD-10-CM

## 2020-03-14 DIAGNOSIS — D62 Acute posthemorrhagic anemia: Secondary | ICD-10-CM | POA: Diagnosis not present

## 2020-03-14 DIAGNOSIS — E7800 Pure hypercholesterolemia, unspecified: Secondary | ICD-10-CM | POA: Diagnosis not present

## 2020-03-14 DIAGNOSIS — Z809 Family history of malignant neoplasm, unspecified: Secondary | ICD-10-CM

## 2020-03-14 DIAGNOSIS — I252 Old myocardial infarction: Secondary | ICD-10-CM

## 2020-03-14 DIAGNOSIS — L89322 Pressure ulcer of left buttock, stage 2: Secondary | ICD-10-CM | POA: Diagnosis present

## 2020-03-14 DIAGNOSIS — E876 Hypokalemia: Secondary | ICD-10-CM | POA: Diagnosis not present

## 2020-03-14 DIAGNOSIS — K810 Acute cholecystitis: Secondary | ICD-10-CM | POA: Diagnosis present

## 2020-03-14 DIAGNOSIS — R059 Cough, unspecified: Secondary | ICD-10-CM

## 2020-03-14 DIAGNOSIS — K2960 Other gastritis without bleeding: Secondary | ICD-10-CM | POA: Diagnosis not present

## 2020-03-14 DIAGNOSIS — Z833 Family history of diabetes mellitus: Secondary | ICD-10-CM

## 2020-03-14 DIAGNOSIS — Z951 Presence of aortocoronary bypass graft: Secondary | ICD-10-CM

## 2020-03-14 DIAGNOSIS — N1831 Chronic kidney disease, stage 3a: Secondary | ICD-10-CM | POA: Diagnosis not present

## 2020-03-14 DIAGNOSIS — R7989 Other specified abnormal findings of blood chemistry: Secondary | ICD-10-CM

## 2020-03-14 DIAGNOSIS — Z794 Long term (current) use of insulin: Secondary | ICD-10-CM

## 2020-03-14 DIAGNOSIS — F0390 Unspecified dementia without behavioral disturbance: Secondary | ICD-10-CM | POA: Diagnosis present

## 2020-03-14 DIAGNOSIS — L89892 Pressure ulcer of other site, stage 2: Secondary | ICD-10-CM | POA: Diagnosis not present

## 2020-03-14 DIAGNOSIS — K5900 Constipation, unspecified: Secondary | ICD-10-CM | POA: Diagnosis not present

## 2020-03-14 HISTORY — DX: Unspecified dementia, unspecified severity, without behavioral disturbance, psychotic disturbance, mood disturbance, and anxiety: F03.90

## 2020-03-14 HISTORY — DX: Methicillin resistant Staphylococcus aureus infection, unspecified site: A49.02

## 2020-03-14 HISTORY — DX: Obesity, unspecified: E66.9

## 2020-03-14 HISTORY — DX: Sleep apnea, unspecified: G47.30

## 2020-03-14 LAB — COMPREHENSIVE METABOLIC PANEL
ALT: 13 U/L (ref 0–44)
AST: 24 U/L (ref 15–41)
Albumin: 2.8 g/dL — ABNORMAL LOW (ref 3.5–5.0)
Alkaline Phosphatase: 82 U/L (ref 38–126)
Anion gap: 17 — ABNORMAL HIGH (ref 5–15)
BUN: 73 mg/dL — ABNORMAL HIGH (ref 8–23)
CO2: 35 mmol/L — ABNORMAL HIGH (ref 22–32)
Calcium: 10 mg/dL (ref 8.9–10.3)
Chloride: 78 mmol/L — ABNORMAL LOW (ref 98–111)
Creatinine, Ser: 3.45 mg/dL — ABNORMAL HIGH (ref 0.61–1.24)
GFR, Estimated: 17 mL/min — ABNORMAL LOW (ref >60)
Glucose, Bld: 306 mg/dL — ABNORMAL HIGH (ref 70–99)
Potassium: 3.5 mmol/L (ref 3.5–5.1)
Sodium: 130 mmol/L — ABNORMAL LOW (ref 135–145)
Total Bilirubin: 1.1 mg/dL (ref 0.3–1.2)
Total Protein: 7.8 g/dL (ref 6.5–8.1)

## 2020-03-14 LAB — URINALYSIS, MICROSCOPIC (REFLEX)

## 2020-03-14 LAB — LIPASE, BLOOD: Lipase: 35 U/L (ref 11–51)

## 2020-03-14 LAB — URINALYSIS, ROUTINE W REFLEX MICROSCOPIC
Bilirubin Urine: NEGATIVE
Glucose, UA: NEGATIVE mg/dL
Ketones, ur: NEGATIVE mg/dL
Leukocytes,Ua: NEGATIVE
Nitrite: NEGATIVE
Protein, ur: 100 mg/dL — AB
Specific Gravity, Urine: >1.03 — ABNORMAL HIGH (ref 1.005–1.030)
pH: 5 (ref 5.0–8.0)

## 2020-03-14 LAB — CBC WITH DIFFERENTIAL/PLATELET
Abs Immature Granulocytes: 0.2 10*3/uL — ABNORMAL HIGH (ref 0.00–0.07)
Basophils Absolute: 0.1 10*3/uL (ref 0.0–0.1)
Basophils Relative: 0 %
Eosinophils Absolute: 0 10*3/uL (ref 0.0–0.5)
Eosinophils Relative: 0 %
HCT: 43.4 % (ref 39.0–52.0)
Hemoglobin: 13.9 g/dL (ref 13.0–17.0)
Immature Granulocytes: 1 %
Lymphocytes Relative: 8 %
Lymphs Abs: 1.7 10*3/uL (ref 0.7–4.0)
MCH: 33.7 pg (ref 26.0–34.0)
MCHC: 32 g/dL (ref 30.0–36.0)
MCV: 105.3 fL — ABNORMAL HIGH (ref 80.0–100.0)
Monocytes Absolute: 0.7 10*3/uL (ref 0.1–1.0)
Monocytes Relative: 3 %
Neutro Abs: 18.6 10*3/uL — ABNORMAL HIGH (ref 1.7–7.7)
Neutrophils Relative %: 88 %
Platelets: 141 10*3/uL — ABNORMAL LOW (ref 150–400)
RBC: 4.12 MIL/uL — ABNORMAL LOW (ref 4.22–5.81)
RDW: 14.2 % (ref 11.5–15.5)
WBC: 21.2 10*3/uL — ABNORMAL HIGH (ref 4.0–10.5)
nRBC: 0 % (ref 0.0–0.2)

## 2020-03-14 LAB — GLUCOSE, CAPILLARY: Glucose-Capillary: 192 mg/dL — ABNORMAL HIGH (ref 70–99)

## 2020-03-14 LAB — LACTIC ACID, PLASMA
Lactic Acid, Venous: 2.2 mmol/L (ref 0.5–1.9)
Lactic Acid, Venous: 1.2 mmol/L (ref 0.5–1.9)

## 2020-03-14 LAB — HEMOGLOBIN A1C
Hgb A1c MFr Bld: 12.1 % — ABNORMAL HIGH (ref 4.8–5.6)
Mean Plasma Glucose: 300.57 mg/dL

## 2020-03-14 LAB — RESPIRATORY PANEL BY RT PCR (FLU A&B, COVID)
Influenza A by PCR: NEGATIVE
Influenza B by PCR: NEGATIVE
SARS Coronavirus 2 by RT PCR: NEGATIVE

## 2020-03-14 LAB — PROTIME-INR
INR: 2.7 — ABNORMAL HIGH (ref 0.8–1.2)
Prothrombin Time: 28.1 seconds — ABNORMAL HIGH (ref 11.4–15.2)

## 2020-03-14 LAB — APTT: aPTT: 44 seconds — ABNORMAL HIGH (ref 24–36)

## 2020-03-14 LAB — CBG MONITORING, ED: Glucose-Capillary: 233 mg/dL — ABNORMAL HIGH (ref 70–99)

## 2020-03-14 LAB — AMMONIA: Ammonia: 17 umol/L (ref 9–35)

## 2020-03-14 LAB — BRAIN NATRIURETIC PEPTIDE: B Natriuretic Peptide: 532.7 pg/mL — ABNORMAL HIGH (ref 0.0–100.0)

## 2020-03-14 LAB — PROCALCITONIN: Procalcitonin: 7.85 ng/mL

## 2020-03-14 MED ORDER — ISOSORBIDE MONONITRATE ER 60 MG PO TB24: 60.0000 mg | ORAL_TABLET | Freq: Every day | ORAL | Status: DC

## 2020-03-14 MED ORDER — ONDANSETRON HCL 4 MG PO TABS: 4.0000 mg | ORAL_TABLET | Freq: Four times a day (QID) | ORAL | Status: DC | PRN

## 2020-03-14 MED ORDER — HYDRALAZINE HCL 50 MG PO TABS
100.0000 mg | ORAL_TABLET | Freq: Two times a day (BID) | ORAL | Status: DC
  Filled 2020-03-14 (×2): qty 2

## 2020-03-14 MED ORDER — ONDANSETRON HCL 4 MG/2ML IJ SOLN: 4.0000 mg | Freq: Four times a day (QID) | INTRAMUSCULAR | Status: DC | PRN

## 2020-03-14 MED ORDER — GABAPENTIN 300 MG PO CAPS: 300.0000 mg | ORAL_CAPSULE | Freq: Every day | ORAL | Status: DC

## 2020-03-14 MED ORDER — PRAVASTATIN SODIUM 40 MG PO TABS
40.0000 mg | ORAL_TABLET | Freq: Every day | ORAL | Status: DC
  Filled 2020-03-14 (×2): qty 1

## 2020-03-14 MED ORDER — INSULIN ASPART 100 UNIT/ML ~~LOC~~ SOLN: 0.0000 [IU] | Freq: Every day | SUBCUTANEOUS | Status: DC

## 2020-03-14 MED ORDER — SODIUM CHLORIDE 0.9 % IV SOLN
2.0000 g | Freq: Once | INTRAVENOUS | Status: AC
  Administered 2020-03-14: 2 g via INTRAVENOUS
  Filled 2020-03-14: qty 2

## 2020-03-14 MED ORDER — ACETAMINOPHEN 325 MG PO TABS: 650.0000 mg | ORAL_TABLET | Freq: Four times a day (QID) | ORAL | Status: DC | PRN

## 2020-03-14 MED ORDER — VITAMIN K1 10 MG/ML IJ SOLN
10.0000 mg | Freq: Once | INTRAVENOUS | Status: AC
  Administered 2020-03-14: 10 mg via INTRAVENOUS
  Filled 2020-03-14: qty 1

## 2020-03-14 MED ORDER — ALLOPURINOL 100 MG PO TABS
100.0000 mg | ORAL_TABLET | Freq: Two times a day (BID) | ORAL | Status: DC
  Filled 2020-03-14 (×3): qty 1

## 2020-03-14 MED ORDER — MORPHINE SULFATE (PF) 2 MG/ML IV SOLN
1.0000 mg | INTRAVENOUS | Status: DC | PRN
  Administered 2020-03-14 (×3): 2 mg via INTRAVENOUS
  Filled 2020-03-14 (×3): qty 1

## 2020-03-14 MED ORDER — LACTATED RINGERS IV SOLN: INTRAVENOUS | Status: DC

## 2020-03-14 MED ORDER — SODIUM CHLORIDE 0.9 % IV BOLUS
500.0000 mL | Freq: Once | INTRAVENOUS | Status: AC
  Administered 2020-03-14: 500 mL via INTRAVENOUS

## 2020-03-14 MED ORDER — INSULIN ASPART 100 UNIT/ML ~~LOC~~ SOLN
0.0000 [IU] | Freq: Three times a day (TID) | SUBCUTANEOUS | Status: DC
  Administered 2020-03-14: 5 [IU] via SUBCUTANEOUS

## 2020-03-14 MED ORDER — SODIUM CHLORIDE 0.9% FLUSH
3.0000 mL | Freq: Two times a day (BID) | INTRAVENOUS | Status: DC
  Administered 2020-03-14 – 2020-04-05 (×41): 3 mL via INTRAVENOUS

## 2020-03-14 MED ORDER — ACETAMINOPHEN 650 MG RE SUPP: 650.0000 mg | Freq: Four times a day (QID) | RECTAL | Status: DC | PRN

## 2020-03-14 MED ORDER — METRONIDAZOLE IN NACL 5-0.79 MG/ML-% IV SOLN
500.0000 mg | Freq: Once | INTRAVENOUS | Status: AC
  Administered 2020-03-14: 500 mg via INTRAVENOUS
  Filled 2020-03-14: qty 100

## 2020-03-14 MED ORDER — QUETIAPINE FUMARATE 50 MG PO TABS
50.0000 mg | ORAL_TABLET | Freq: Every day | ORAL | Status: DC
  Administered 2020-03-14: 50 mg via ORAL
  Filled 2020-03-14: qty 1

## 2020-03-14 MED ORDER — OXYCODONE HCL 5 MG PO TABS: 5.0000 mg | ORAL_TABLET | ORAL | Status: DC | PRN

## 2020-03-14 MED ORDER — GABAPENTIN 300 MG PO CAPS
300.0000 mg | ORAL_CAPSULE | Freq: Two times a day (BID) | ORAL | Status: DC
  Administered 2020-03-14: 300 mg via ORAL
  Filled 2020-03-14: qty 1
  Filled 2020-03-14: qty 3

## 2020-03-14 MED ORDER — METRONIDAZOLE IN NACL 5-0.79 MG/ML-% IV SOLN
500.0000 mg | Freq: Three times a day (TID) | INTRAVENOUS | Status: DC
  Administered 2020-03-14 – 2020-03-15 (×3): 500 mg via INTRAVENOUS
  Filled 2020-03-14 (×3): qty 100

## 2020-03-14 MED ORDER — LACTATED RINGERS IV BOLUS (SEPSIS)
500.0000 mL | Freq: Once | INTRAVENOUS | Status: AC
  Administered 2020-03-14: 500 mL via INTRAVENOUS

## 2020-03-14 MED ORDER — SODIUM CHLORIDE 0.9 % IV SOLN: 2.0000 g | INTRAVENOUS | Status: DC

## 2020-03-14 NOTE — Progress Notes (Signed)
RT set up CPAP and placed on patient with 2L O2 bled into circuit. Patient tolerating CPAP well at this time. RT will monitor as needed.

## 2020-03-14 NOTE — ED Triage Notes (Signed)
Per EMS patient presented with generalized weakness since Sunday morning. Patient presents with pressure ulcer on his buttocks. When EMS arrived patient was 87% on room air. They applied 3L  Of O2 [place] and patients O2 went up to 92%. Family states that patient is usually ambulatory with walker but has been unable to ambulate since Sunday morning.

## 2020-03-14 NOTE — Consult Note (Signed)
Chief Complaint: Patient was seen in consultation today for percutaneous cholecystostomy drain placement Chief Complaint  Patient presents with  . Weakness  . Skin Ulcer   at the request of CCS  Referring Physician(s): Dr Alvira Monday  Supervising Physician: Simonne Come  Patient Status: [facility] - ED  History of Present Illness: Isaiah [name] is a 79 y.o. male   CAD; CHF; Afib (on coumadin) INR 2.7 today Hx DVT; DM; HTN; HLD; PVD  Presented to ED AMS and abd pain No fever or SOB  ED eval reveals leukocytosis Now with low grade temp Cr 3.45  CT: IMPRESSION: Motion degraded images. Distended gallbladder with layering gallstones and mild pericholecystic stranding, raising concern for acute cholecystitis.  Request made for perc chole drain  Dr Grace Isaac has reviewed imaging and approves procedure CCS note:  Pt and Dtr understand the reasoning for a perc chole drain as well as the plan for cardiac preoperative clearance as an outpatient with likely plans to proceed with interval lap chole in 6-8 weeks if cleared.  CCS and TR[facility] will treat anticoagulation this pm Plan for chole drain in am  Past Medical History:  Diagnosis Date  . Atrial fibrillation (HCC)    on Coumadin  . CAD (coronary artery disease)    s/p remote CABG, stent in 2019  . CHF (congestive heart failure) (HCC)   . Diabetes mellitus   . DVT (deep venous thrombosis) (HCC)   . Fall at home 10/2015  . Hyperlipidemia   . Hypertension   . Left-sided carotid artery disease (HCC)   . Peripheral vascular disease (HCC)   . Stroke (HCC)   . Venous insufficiency     Past Surgical History:  Procedure Laterality Date  . CORONARY ARTERY BYPASS GRAFT  1997   vein harvest from right leg  . CORONARY STENT INTERVENTION N/A 08/07/2016   Procedure: Coronary Stent Intervention;  Surgeon: Corky Crafts, MD;  Location[facility] INVASIVE CV LAB;  Service: Cardiovascular;  Laterality: N/A;  RCA  .  ESOPHAGOGASTRODUODENOSCOPY N/A 08/12/2016   Procedure: ESOPHAGOGASTRODUODENOSCOPY (EGD);  Surgeon: Carman Ching, MD;  Location: [facility] ENDOSCOPY;  Service: Endoscopy;  Laterality: N/A;  . INGUINAL HERNIA REPAIR  1980's   Bilateral,  done in [place]  . LEFT HEART CATH AND CORS/GRAFTS ANGIOGRAPHY N/A 08/07/2016   Procedure: Left Heart Cath and Cors/Grafts Angiography;  Surgeon: Corky Crafts, MD;  Location: [facility] INVASIVE CV LAB;  Service: Cardiovascular;  Laterality: N/A;  . PR VEIN BYPASS GRAFT,AORTO-FEM-POP  1980   FEM-FEM BPG by [name]    Allergies: Patient has no known allergies.  Medications: Prior to Admission medications   Medication Sig Start Date End Date Taking? Authorizing Provider  allopurinol (ZYLOPRIM) 100 MG tablet TAKE 2 TABLETS BY MOUTH DAILY. Patient taking differently: Take 100 mg by mouth 2 (two) times daily.  05/03/14   Runell Gess, MD  colchicine 0.6 MG tablet Take 0.6 mg by mouth daily as needed (gout attacks).    [provider]  docusate sodium (COLACE) 100 MG capsule Take 1 capsule (100 mg total) by mouth 2 (two) times daily. 12/16/18   Jadene Pierini, MD  ferrous sulfate 325 (65 FE) MG tablet Take 1 tablet (325 mg total) by mouth 2 (two) times daily with a meal. 08/09/16   Ghimire, Werner Lean, MD  furosemide (LASIX) 40 MG tablet Take 1 tablet (40 mg total) by mouth 2 (two) times daily. 08/24/16   Osvaldo Shipper, MD  gabapentin (NEURONTIN)  100 MG capsule Take 100 mg by mouth 3 (three) times daily with meals.  01/28/13   [provider]  gabapentin (NEURONTIN) 300 MG capsule Take 300 mg by mouth at bedtime.    [provider]  glipiZIDE (GLUCOTROL) 5 MG tablet Take 5 mg by mouth 2 (two) times daily before a meal.    [provider]  hydrALAZINE (APRESOLINE) 100 MG tablet Take 100 mg by mouth 2 (two) times daily.    [provider]  insulin aspart protamine- aspart (NOVOLOG MIX 70/30) (70-30) 100 UNIT/ML  injection Inject 35 Units into the skin 3 (three) times daily as needed (CBG >200).     [provider]  isosorbide mononitrate (IMDUR) 60 MG 24 hr tablet Take 1 tablet (60 mg total) by mouth daily. Appointment needed for future refills Patient taking differently: Take 60 mg by mouth daily.  05/03/15   Runell Gess, MD  Multiple Vitamin (MULTIVITAMIN WITH MINERALS) TABS tablet Take 1 tablet by mouth daily.    [provider]  nitroGLYCERIN (NITROSTAT) 0.4 MG SL tablet Place 0.4 mg under the tongue every 5 (five) minutes as needed for chest pain.    [provider]  oxyCODONE (OXY IR/ROXICODONE) 5 MG immediate release tablet Take 1 tablet (5 mg total) by mouth every 4 (four) hours as needed (pain). 12/16/18   Jadene Pierini, MD  pantoprazole (PROTONIX) 40 MG tablet Take 40 mg by mouth 2 (two) times daily before a meal.     [provider]  potassium chloride (K-DUR) 10 MEQ tablet Take 1 tablet (10 mEq total) by mouth daily. 08/31/16   Barrett, Joline Salt, PA-C  pravastatin (PRAVACHOL) 40 MG tablet Take 40 mg by mouth at bedtime.     [provider]  PRESCRIPTION MEDICATION Inhale into the lungs at bedtime. CPAP    [provider]  Psyllium (METAMUCIL PO) Take 2 capsules by mouth at bedtime.    [provider]  QUEtiapine (SEROQUEL) 50 MG tablet Take 50 mg by mouth daily with supper.    [provider]     Family History  Problem Relation Age of Onset  . Other Mother        varicose veins  . Heart disease Mother   . Hyperlipidemia Mother   . Hypertension Mother   . Varicose Veins Mother   . Cancer Father   . Diabetes Father   . Heart disease Father        before age 67  . Hyperlipidemia Father   . Hypertension Father   . Other Father        varicose veins  . Heart attack Father   . Diabetes Daughter   . Hyperlipidemia Daughter   . Hypertension Daughter   . Other Daughter        varicose veins  . Varicose  Veins Daughter   . Hypertension Son   . Diabetes Sister   . Heart disease Sister        DVT  . Other Sister        varicose veins  . Hyperlipidemia Sister   . Hypertension Sister   . Varicose Veins Sister   . Other Brother        varicose veins  . Hyperlipidemia Brother   . Hypertension Brother   . Heart attack Brother     Social History   Socioeconomic History  . Marital status: Divorced    Spouse name: Not on file  .  Number of children: Not on file  . Years of education: Not on file  . Highest education level: Not on file  Occupational History  . Not on file  Tobacco Use  . Smoking status: Former Smoker    Types: Cigarettes    Quit date: 05/14/1978    Years since quitting: 41.8  . Smokeless tobacco: Never Used  Substance and Sexual Activity  . Alcohol use: No  . Drug use: No  . Sexual activity: Not on file  Other Topics Concern  . Not on file  Social History Narrative  . Not on file   Social Determinants of Health   Financial Resource Strain:   . Difficulty of Paying Living Expenses: Not on file  Food Insecurity:   . Worried About Programme researcher, broadcasting/film/video in the Last Year: Not on file  . Ran Out of Food in the Last Year: Not on file  Transportation Needs:   . Lack of Transportation (Medical): Not on file  . Lack of Transportation (Non-Medical): Not on file  Physical Activity:   . Days of Exercise per Week: Not on file  . Minutes of Exercise per Session: Not on file  Stress:   . Feeling of Stress : Not on file  Social Connections:   . Frequency of Communication with Friends and Family: Not on file  . Frequency of Social Gatherings with Friends and Family: Not on file  . Attends Religious Services: Not on file  . Active Member of Clubs or Organizations: Not on file  . Attends Banker Meetings: Not on file  . Marital Status: Not on file    Review of Systems: A 12 point ROS discussed and pertinent positives are indicated in the HPI above.  All  other systems are negative.  Review of Systems  Constitutional: Positive for activity change and fever.  Respiratory: Negative for cough and shortness of breath.   Cardiovascular: Negative for chest pain.  Gastrointestinal: Positive for abdominal pain and constipation.  Psychiatric/Behavioral: Positive for confusion. Negative for behavioral problems.    Vital Signs: BP (!) 115/96 (BP Location: Right Arm)   Pulse 64   Temp (!) 100.6 F (38.1 C) (Rectal)   Resp 16   Ht 5\' 6"  (1.676 m)   Wt 210 lb (95.3 kg)   SpO2 98%   BMI 33.89 kg/m   Physical Exam Vitals reviewed.  Cardiovascular:     Rate and Rhythm: Normal rate and regular rhythm.     Heart sounds: Normal heart sounds.  Pulmonary:     Effort: Pulmonary effort is normal.     Breath sounds: Normal breath sounds.  Abdominal:     Palpations: Abdomen is soft.     Tenderness: There is abdominal tenderness.  Musculoskeletal:        General: Normal range of motion.  Skin:    General: Skin is warm.  Neurological:     General: No focal deficit present.     Mental Status: He is alert.  Psychiatric:        Behavior: Behavior normal.     Comments: Consented with Dtr at bedside Signed by Dtr     Imaging: CT ABDOMEN PELVIS WO CONTRAST  Result Date: 03/14/2020 CLINICAL DATA:  Right upper quadrant abdominal pain EXAM: CT ABDOMEN AND PELVIS WITHOUT CONTRAST TECHNIQUE: Multidetector CT imaging of the abdomen and pelvis was performed following the standard protocol without IV contrast. COMPARISON:  12/08/2018 FINDINGS: Motion degraded images. Lower chest: Trace bilateral pleural effusions.  Associated lower lobe opacities, likely atelectasis. Hepatobiliary: Cirrhosis.  No focal hepatic lesion is seen. Distended gallbladder with layering gallstones (series 3/image 36). Mild pericholecystic stranding (series 3/image 40), raising concern for acute cholecystitis. No intrahepatic or extrahepatic ductal dilatation. Pancreas: Within normal  limits. Spleen: Within normal limits. Adrenals/Urinary Tract: Adrenal glands are within normal limits. 2.3 cm medial right lower pole renal cyst (series 3/image 45). Bilateral kidneys are otherwise unremarkable, noting renal vascular calcifications. No hydronephrosis. Mildly thick-walled bladder, although underdistended. Stomach/Bowel: Stomach is within normal limits. No evidence of bowel obstruction. Appendix is not discretely visualized. Scattered sigmoid diverticulosis, without evidence of diverticulitis. Vascular/Lymphatic: No evidence of abdominal aortic aneurysm. Extensive atherosclerotic calcifications of the abdominal aorta and branch vessels. No suspicious abdominopelvic lymphadenopathy. Reproductive: Prostate is unremarkable. Other: No abdominopelvic ascites. Musculoskeletal: Degenerative changes of the visualized thoracolumbar spine. Posterior thoracic fixation ending at L1, incompletely visualized. Median sternotomy. IMPRESSION: Motion degraded images. Distended gallbladder with layering gallstones and mild pericholecystic stranding, raising concern for acute cholecystitis. Additional ancillary findings as above. Electronically Signed   By: Charline BillsSriyesh  Krishnan M.D.   On: 03/14/2020 09:50   CT Head Wo Contrast  Result Date: 03/14/2020 CLINICAL DATA:  Altered mental status/delirium EXAM: CT HEAD WITHOUT CONTRAST TECHNIQUE: Contiguous axial images were obtained from the base of the skull through the vertex without intravenous contrast. COMPARISON:  08/11/2016 FINDINGS: Brain: No evidence of acute infarction, hemorrhage, hydrocephalus, extra-axial collection or mass lesion/mass effect. Global cortical atrophy. Subcortical white matter and periventricular small vessel ischemic changes. Vascular: Intracranial atherosclerosis. Skull: Normal. Negative for fracture or focal lesion. Sinuses/Orbits: The visualized paranasal sinuses are essentially clear. The mastoid air cells are unopacified. Other: None.  IMPRESSION: No evidence of acute intracranial abnormality. Atrophy with small vessel ischemic changes. Electronically Signed   By: Charline BillsSriyesh  Krishnan M.D.   On: 03/14/2020 09:36   DG Chest Portable 1 View  Result Date: 03/14/2020 CLINICAL DATA:  Shortness of breath and fatigue EXAM: PORTABLE CHEST 1 VIEW COMPARISON:  12/08/2018 FINDINGS: Signs of previous CABG procedure. Aortic atherosclerotic calcifications. Heart size normal. Small pleural effusions are suspected with blunting of the costophrenic angles. Pulmonary vascular congestion. IMPRESSION: Suspect small bilateral pleural effusions and pulmonary vascular congestion. No signs of pneumonia. Electronically Signed   By: Signa Kellaylor  Stroud M.D.   On: 03/14/2020 07:02    Labs:  CBC: Recent Labs    03/14/20 0743  WBC 21.2*  HGB 13.9  HCT 43.4  PLT 141*    COAGS: Recent Labs    03/14/20 0911  INR 2.7*  APTT 44*    BMP: Recent Labs    03/14/20 0743  NA 130*  K 3.5  CL 78*  CO2 35*  GLUCOSE 306*  BUN 73*  CALCIUM 10.0  CREATININE 3.45*  GFRNONAA 17*    LIVER FUNCTION TESTS: Recent Labs    03/14/20 0743  BILITOT 1.1  AST 24  ALT 13  ALKPHOS 82  PROT 7.8  ALBUMIN 2.8*    TUMOR MARKERS: No results for input(s): AFPTM, CEA, CA199, CHROMGRNA in the last 8760 hours.  Assessment and Plan:  Abd pain; AMS Cholecystitis Leukocytosis; fever INR 2.7 (on coumadin-- Afib) Likely will plan for lap chole 6-8 weeks per CCS Plan for percutaneous cholecystostomy drain placement in IR 11/2 CCS and TRH reversing anticoagulation this pm (IV Vit K) Check INR in am Risks and benefits discussed with the patient and daughter including, but not limited to bleeding, infection, gallbladder perforation, bile leak, sepsis or even death.  All  questions were answered, patient is agreeable to proceed. Consent signed and in chart.  Thank you for this interesting consult.  I greatly enjoyed meeting Isaiah [name] and look forward to  participating in their care.  A copy of this report was sent to the requesting provider on this date.  Electronically Signed: Robet Leu, PA-C 03/14/2020, 1:22 PM   I spent a total of 40 Minutes    in face to face in clinical consultation, greater than 50% of which was counseling/coordinating care for perc chole drain

## 2020-03-14 NOTE — ED Provider Notes (Signed)
[facility]   CSN: [id number] Arrival date & time: 03/14/20  0551     History Chief Complaint  Patient presents with  . Weakness  . Skin Ulcer    Isaiah [name] is a 79 y.o. male.  HPI      79 year old male with history of coronary artery disease, CHF, atrial fibrillation on Coumadin, DVT, hypertension, hyperlipidemia, peripheral vascular disease, CVA, history of thoracic back surgery last year, presents with concern for altered mental status and generalized weakness.  Daughter reports that on Friday he had some abdominal discomfort and bloating, and on Saturday developed some generalized weakness.  Yesterday, he spent the whole day in bed, and when she encouraged him to get up to go to the bathroom noted he had generalized weakness with buckling of both knees when they attempted to get him up.  Normally he ambulates without problems with his walker.  He is normally alert and oriented, however has developed confusion over the past 2 days.  Over the weekend has developed confusion, decreased appetite.  Denies trauma, chest pain, cough, shortness of breath, vomiting, diarrhea, black or bloody stools, urinary symptoms. Has had decreased urination for the past day.   Found to have hypoxia on EMS arrival No sick contacts, Moderna COVID 19 vaccine  On gabapentin Typical BP in 100s, sometimes upper 90s  Past Medical History:  Diagnosis Date  . Atrial fibrillation (HCC)   . CAD (coronary artery disease)   . CHF (congestive heart failure) (HCC)   . Diabetes mellitus   . DVT (deep venous thrombosis) (HCC)   . Fall at home 10/2015  . Hyperlipidemia   . Hypertension   . Left-sided carotid artery disease (HCC)   . Myocardial infarction (HCC)   . NSTEMI (non-ST elevated myocardial infarction) (HCC)   . Peripheral vascular disease (HCC)   . Stroke (HCC)   . Venous insufficiency     Patient Active Problem List   Diagnosis Date  Noted  . Pressure injury of skin 12/15/2018  . Thoracic spine fracture (HCC) 12/12/2018  . Hyponatremia 08/22/2016  . ARF (acute renal failure) (HCC) 08/21/2016  . Hypokalemia 08/11/2016  . Occult GI bleeding 08/11/2016  . General weakness 08/11/2016  . GI bleed 08/11/2016  . Weakness   . Non-ST elevation (NSTEMI) myocardial infarction (HCC) 08/08/2016  . Unstable angina (HCC) 08/06/2016  . Chest pain 08/05/2016  . CKD (chronic kidney disease), stage III (HCC) 08/05/2016  . Normochromic normocytic anemia 08/05/2016  . Pain in joint, lower leg 10/01/2014  . Coronary artery disease 04/27/2014  . Chronic atrial fibrillation (HCC) 04/27/2014  . Essential hypertension 04/27/2014  . Hyperlipidemia 04/27/2014  . Type 2 diabetes mellitus with vascular disease (HCC) 04/27/2014  . Obstructive sleep apnea 04/27/2014  . Stroke (HCC) 04/27/2014  . On continuous oral anticoagulation 04/27/2014  . Occlusion and stenosis of carotid artery without mention of cerebral infarction 02/04/2013  . Aftercare following surgery of the circulatory system, NEC 01/09/2012  . Chronic total occlusion of artery of the extremities (HCC) 01/09/2012    Past Surgical History:  Procedure Laterality Date  . CORONARY ARTERY BYPASS GRAFT  1997   vein harvest from right leg  . CORONARY STENT INTERVENTION N/A 08/07/2016   Procedure: Coronary Stent Intervention;  Surgeon: Corky CraftsJayadeep S Varanasi, MD;  [facility];  Service: Cardiovascular;  Laterality: N/A;  RCA  . ESOPHAGOGASTRODUODENOSCOPY N/A 08/12/2016   Procedure: ESOPHAGOGASTRODUODENOSCOPY (EGD);  Surgeon: Carman ChingJames Edwards, MD;  Location: [facility] ENDOSCOPY;  Service: Endoscopy;  Laterality: N/A;  . INGUINAL HERNIA REPAIR  1980's   Bilateral,  done in [place]  . LEFT HEART CATH AND CORS/GRAFTS ANGIOGRAPHY N/A 08/07/2016   Procedure: Left Heart Cath and Cors/Grafts Angiography;  Surgeon: Corky Crafts, MD;  Location: [facility] INVASIVE CV LAB;  Service:  Cardiovascular;  Laterality: N/A;  . PR VEIN BYPASS GRAFT,AORTO-FEM-POP  1980   FEM-FEM BPG by [name]       Family History  Problem Relation Age of Onset  . Other Mother        varicose veins  . Heart disease Mother   . Hyperlipidemia Mother   . Hypertension Mother   . Varicose Veins Mother   . Cancer Father   . Diabetes Father   . Heart disease Father        before age 51  . Hyperlipidemia Father   . Hypertension Father   . Other Father        varicose veins  . Heart attack Father   . Diabetes Daughter   . Hyperlipidemia Daughter   . Hypertension Daughter   . Other Daughter        varicose veins  . Varicose Veins Daughter   . Hypertension Son   . Diabetes Sister   . Heart disease Sister        DVT  . Other Sister        varicose veins  . Hyperlipidemia Sister   . Hypertension Sister   . Varicose Veins Sister   . Other Brother        varicose veins  . Hyperlipidemia Brother   . Hypertension Brother   . Heart attack Brother     Social History   Tobacco Use  . Smoking status: Former Smoker    Types: Cigarettes    Quit date: 05/14/1978    Years since quitting: 41.8  . Smokeless tobacco: Never Used  Substance Use Topics  . Alcohol use: No  . Drug use: No    Home Medications Prior to Admission medications   Medication Sig Start Date End Date Taking? Authorizing Provider  allopurinol (ZYLOPRIM) 100 MG tablet TAKE 2 TABLETS BY MOUTH DAILY. Patient taking differently: Take 100 mg by mouth 2 (two) times daily.  05/03/14   Runell Gess, MD  colchicine 0.6 MG tablet Take 0.6 mg by mouth daily as needed (gout attacks).    [provider]  docusate sodium (COLACE) 100 MG capsule Take 1 capsule (100 mg total) by mouth 2 (two) times daily. 12/16/18   Jadene Pierini, MD  ferrous sulfate 325 (65 FE) MG tablet Take 1 tablet (325 mg total) by mouth 2 (two) times daily with a meal. 08/09/16   Ghimire, Werner Lean, MD  furosemide (LASIX) 40 MG tablet Take  1 tablet (40 mg total) by mouth 2 (two) times daily. 08/24/16   Osvaldo Shipper, MD  gabapentin (NEURONTIN) 100 MG capsule Take 100 mg by mouth 3 (three) times daily with meals.  01/28/13   [provider]  gabapentin (NEURONTIN) 300 MG capsule Take 300 mg by mouth at bedtime.    [provider]  glipiZIDE (GLUCOTROL) 5 MG tablet Take 5 mg by mouth 2 (two) times daily before a meal.    [provider]  hydrALAZINE (APRESOLINE) 100 MG tablet Take 100 mg by mouth 2 (two) times daily.    [provider]  insulin aspart protamine- aspart (NOVOLOG MIX 70/30) (70-30) 100  UNIT/ML injection Inject 35 Units into the skin 3 (three) times daily as needed (CBG >200).     [provider]  isosorbide mononitrate (IMDUR) 60 MG 24 hr tablet Take 1 tablet (60 mg total) by mouth daily. Appointment needed for future refills Patient taking differently: Take 60 mg by mouth daily.  05/03/15   Runell Gess, MD  Multiple Vitamin (MULTIVITAMIN WITH MINERALS) TABS tablet Take 1 tablet by mouth daily.    [provider]  nitroGLYCERIN (NITROSTAT) 0.4 MG SL tablet Place 0.4 mg under the tongue every 5 (five) minutes as needed for chest pain.    [provider]  oxyCODONE (OXY IR/ROXICODONE) 5 MG immediate release tablet Take 1 tablet (5 mg total) by mouth every 4 (four) hours as needed (pain). 12/16/18   Jadene Pierini, MD  pantoprazole (PROTONIX) 40 MG tablet Take 40 mg by mouth 2 (two) times daily before a meal.     [provider]  potassium chloride (K-DUR) 10 MEQ tablet Take 1 tablet (10 mEq total) by mouth daily. 08/31/16   Barrett, Joline Salt, PA-C  pravastatin (PRAVACHOL) 40 MG tablet Take 40 mg by mouth at bedtime.     [provider]  PRESCRIPTION MEDICATION Inhale into the lungs at bedtime. CPAP    [provider]  Psyllium (METAMUCIL PO) Take 2 capsules by mouth at bedtime.    [provider]  QUEtiapine (SEROQUEL)  50 MG tablet Take 50 mg by mouth daily with supper.    [provider]    Allergies    Patient has no known allergies.  Review of Systems   Review of Systems  Unable to perform ROS: Mental status change  Constitutional: Positive for activity change, appetite change, fatigue and fever (noted on arrival to ED).  HENT: Negative for sore throat.   Eyes: Negative for visual disturbance.  Respiratory: Negative for cough and shortness of breath.   Cardiovascular: Negative for chest pain.  Gastrointestinal: Positive for nausea. Negative for abdominal pain (Friday but denies now), diarrhea and vomiting.  Genitourinary: Negative for difficulty urinating (decreased urination).  Skin: Negative for rash.  Neurological: Negative for syncope and headaches.  Psychiatric/Behavioral: Positive for confusion.    Physical Exam Updated Vital Signs BP (!) 143/69   Pulse 68   Temp 98.5 F (36.9 C) (Oral)   Resp 17   Ht  (1.676 m)   Wt 95.3 kg   SpO2 100%   BMI 33.89 kg/m   Physical Exam Vitals and nursing note reviewed.  Constitutional:      General: He is not in acute distress.    Appearance: He is well-developed. He is not diaphoretic.     Comments: Sleepy  HENT:     Head: Normocephalic and atraumatic.  Eyes:     Conjunctiva/sclera: Conjunctivae normal.  Cardiovascular:     Rate and Rhythm: Normal rate and regular rhythm.     Heart sounds: Normal heart sounds. No murmur heard.  No friction rub. No gallop.   Pulmonary:     Effort: Pulmonary effort is normal. No respiratory distress.     Breath sounds: Rales present. No wheezing.  Abdominal:     General: There is no distension.     Palpations: Abdomen is soft.     Tenderness: There is abdominal tenderness in the right upper quadrant. There is no guarding. Positive signs include Murphy's sign.  Musculoskeletal:     Cervical back: Normal range of motion.  Skin:  General: Skin is warm and dry.     Comments: Sacral  pressure ulcers Stage II, mild surrounding erythema, no purulence or fluctuance  Neurological:     Mental Status: He is alert.     Comments: Sleepy, generally weak No pronator drift or LE drift, noted to have some myoclonic jerking during exam     ED Results / Procedures / Treatments   Labs (all labs ordered are listed, but only abnormal results are displayed) Labs Reviewed  CBC WITH DIFFERENTIAL/PLATELET - Abnormal; Notable for the following components:      Result Value   WBC 21.2 (*)    RBC 4.12 (*)    MCV 105.3 (*)    Platelets 141 (*)    Neutro Abs 18.6 (*)    Abs Immature Granulocytes 0.20 (*)    All other components within normal limits  COMPREHENSIVE METABOLIC PANEL - Abnormal; Notable for the following components:   Sodium 130 (*)    Chloride 78 (*)    CO2 35 (*)    Glucose, Bld 306 (*)    BUN 73 (*)    Creatinine, Ser 3.45 (*)    Albumin 2.8 (*)    GFR, Estimated 17 (*)    Anion gap 17 (*)    All other components within normal limits  LACTIC ACID, PLASMA - Abnormal; Notable for the following components:   Lactic Acid, Venous 2.2 (*)    All other components within normal limits  URINALYSIS, ROUTINE W REFLEX MICROSCOPIC - Abnormal; Notable for the following components:   APPearance HAZY (*)    Specific Gravity, Urine >1.030 (*)    Hgb urine dipstick TRACE (*)    Protein, ur 100 (*)    All other components within normal limits  BRAIN NATRIURETIC PEPTIDE - Abnormal; Notable for the following components:   B Natriuretic Peptide 532.7 (*)    All other components within normal limits  PROTIME-INR - Abnormal; Notable for the following components:   Prothrombin Time 28.1 (*)    INR 2.7 (*)    All other components within normal limits  APTT - Abnormal; Notable for the following components:   aPTT 44 (*)    All other components within normal limits  URINALYSIS, MICROSCOPIC (REFLEX) - Abnormal; Notable for the following components:   Bacteria, UA RARE (*)    Non  Squamous Epithelial PRESENT (*)    All other components within normal limits  RESPIRATORY PANEL BY RT PCR (FLU A&B, COVID)  CULTURE, BLOOD (ROUTINE X 2)  CULTURE, BLOOD (ROUTINE X 2)  URINE CULTURE  AMMONIA  LIPASE, BLOOD  LACTIC ACID, PLASMA    EKG EKG Interpretation  Date/Time:  Monday March 14 2020 05:52:27 EDT Ventricular Rate:  85 PR Interval:    QRS Duration: 106 QT Interval:  385 QTC Calculation: 458 R Axis:   35 Text Interpretation: Accelerated junctional rhythm Borderline low voltage, extremity leads Consider anterior infarct Confirmed by Marily Memos [phone]) on 03/14/2020 5:58:33 AM   Radiology CT ABDOMEN PELVIS WO CONTRAST  Result Date: 03/14/2020 CLINICAL DATA:  Right upper quadrant abdominal pain EXAM: CT ABDOMEN AND PELVIS WITHOUT CONTRAST TECHNIQUE: Multidetector CT imaging of the abdomen and pelvis was performed following the standard protocol without IV contrast. COMPARISON:  12/08/2018 FINDINGS: Motion degraded images. Lower chest: Trace bilateral pleural effusions. Associated lower lobe opacities, likely atelectasis. Hepatobiliary: Cirrhosis.  No focal hepatic lesion is seen. Distended gallbladder with layering gallstones (series 3/image 36). Mild pericholecystic stranding (series 3/image 40), raising concern  for acute cholecystitis. No intrahepatic or extrahepatic ductal dilatation. Pancreas: Within normal limits. Spleen: Within normal limits. Adrenals/Urinary Tract: Adrenal glands are within normal limits. 2.3 cm medial right lower pole renal cyst (series 3/image 45). Bilateral kidneys are otherwise unremarkable, noting renal vascular calcifications. No hydronephrosis. Mildly thick-walled bladder, although underdistended. Stomach/Bowel: Stomach is within normal limits. No evidence of bowel obstruction. Appendix is not discretely visualized. Scattered sigmoid diverticulosis, without evidence of diverticulitis. Vascular/Lymphatic: No evidence of abdominal aortic  aneurysm. Extensive atherosclerotic calcifications of the abdominal aorta and branch vessels. No suspicious abdominopelvic lymphadenopathy. Reproductive: Prostate is unremarkable. Other: No abdominopelvic ascites. Musculoskeletal: Degenerative changes of the visualized thoracolumbar spine. Posterior thoracic fixation ending at L1, incompletely visualized. Median sternotomy. IMPRESSION: Motion degraded images. Distended gallbladder with layering gallstones and mild pericholecystic stranding, raising concern for acute cholecystitis. Additional ancillary findings as above. Electronically Signed   By: Charline Bills M.D.   On: 03/14/2020 09:50   CT Head Wo Contrast  Result Date: 03/14/2020 CLINICAL DATA:  Altered mental status/delirium EXAM: CT HEAD WITHOUT CONTRAST TECHNIQUE: Contiguous axial images were obtained from the base of the skull through the vertex without intravenous contrast. COMPARISON:  08/11/2016 FINDINGS: Brain: No evidence of acute infarction, hemorrhage, hydrocephalus, extra-axial collection or mass lesion/mass effect. Global cortical atrophy. Subcortical white matter and periventricular small vessel ischemic changes. Vascular: Intracranial atherosclerosis. Skull: Normal. Negative for fracture or focal lesion. Sinuses/Orbits: The visualized paranasal sinuses are essentially clear. The mastoid air cells are unopacified. Other: None. IMPRESSION: No evidence of acute intracranial abnormality. Atrophy with small vessel ischemic changes. Electronically Signed   By: Charline Bills M.D.   On: 03/14/2020 09:36   DG Chest Portable 1 View  Result Date: 03/14/2020 CLINICAL DATA:  Shortness of breath and fatigue EXAM: PORTABLE CHEST 1 VIEW COMPARISON:  12/08/2018 FINDINGS: Signs of previous CABG procedure. Aortic atherosclerotic calcifications. Heart size normal. Small pleural effusions are suspected with blunting of the costophrenic angles. Pulmonary vascular congestion. IMPRESSION: Suspect small  bilateral pleural effusions and pulmonary vascular congestion. No signs of pneumonia. Electronically Signed   By: Signa Kell M.D.   On: 03/14/2020 07:02    Procedures .Critical Care Performed by: Alvira Monday, MD Authorized by: Alvira Monday, MD   Critical care provider statement:    Critical care time (minutes):  60   Critical care was time spent personally by me on the following activities:  Discussions with consultants, evaluation of patient's response to treatment, examination of patient, ordering and performing treatments and interventions, ordering and review of laboratory studies, ordering and review of radiographic studies, pulse oximetry, re-evaluation of patient's condition, obtaining history from patient or surrogate and review of old charts   (including critical care time)  Medications Ordered in ED Medications  ceFEPIme (MAXIPIME) 2 g in sodium chloride 0.9 % 100 mL IVPB (has no administration in time range)  ceFEPIme (MAXIPIME) 2 g in sodium chloride 0.9 % 100 mL IVPB (0 g Intravenous Stopped 03/14/20 0845)  metroNIDAZOLE (FLAGYL) IVPB 500 mg (0 mg Intravenous Stopped 03/14/20 0935)  sodium chloride 0.9 % bolus 500 mL (0 mLs Intravenous Stopped 03/14/20 1016)    ED Course  I have reviewed the triage vital signs and the nursing notes.  Pertinent labs & imaging results that were available during my care of the patient were reviewed by me and considered in my medical decision making (see chart for details).    MDM Rules/Calculators/A&P  79 year old male with history of coronary artery disease, CHF, atrial fibrillation on Coumadin, DVT, hypertension, hyperlipidemia, peripheral vascular disease, CVA, history of thoracic back surgery last year, presents with concern for altered mental status and generalized weakness.  Noted to have hypoxia and fever to 100.6 on arrival. Hx and exam most concerning for sepsis secondary to cholecystitis or other  biliary etiology, however ddx includes ICH, PE, other intraabdominal infection, UTI, CHF, medication effects.  No sign of pneumonia or pulmonary edema on CXR.  Given empiric flagyl/cefepime for suspected intraabdominal source of sepsis while UA and CT pending.  Labs significant for acute on chronic kidney injury with Cr of 3.45. Hypochloremic metabolic alkalosis possibly secondary to diuretics with additional anion gap metabolic acidosis likely related to lactic acid.  Concern on exam for rales, pulmonary vascular congestion on XR and hypoxia---mixed picture of volume status by exam, history, XR and labs. Given 500cc NS but not 30cc/kg per sepsis protocol given concern for possible volume overload and no signs of severe sepsis.  Hypoxia may be secondary to sepsis, CHF--doubt PE given pt on coumadin.    Encephalopathy likely secondary to acute illness and possible medication effect related to AKI.  CT head WNL, no focal neurologic findings on exam. Myoclous likely secondary to gabapentin use and renal failure.   CT abdomen pelvis shows distended gallbladder with layering gallstones and mild pericholecystic stranding.  Feel this is consistent with cholecystitis clinically and discussed with General Surgery who will come to bedside for evaluation.   Will admit to hospitalist given AKI, sepsis, CHF.     Final Clinical Impression(s) / ED Diagnoses Final diagnoses:  Cholecystitis  Sepsis with acute renal failure without septic shock, due to unspecified organism, unspecified acute renal failure type (HCC)  Acute kidney injury (HCC)  Elevated brain natriuretic peptide (BNP) level  Hypoxia  Encephalopathy    Rx / DC Orders ED Discharge Orders    None       Alvira Monday, MD 03/14/20 1105

## 2020-03-14 NOTE — Consult Note (Signed)
[facility] Consult Note  Isaiah ReinRobert W Janak 1941-02-26  [id number].    Requesting MD: Alvira MondayErin Schlossman Chief Complaint/Reason for Consult: cholecystitis  HPI:  Isaiah [name] is a 79yo male with multiple medical problems including CAD, CHF (ECHO 07/2016 EF 55-65%), A Fib on coumadin - INR 2.7, Hx DVT, DM, HTN, HLD, PVD, Hx CVA (although daughter states he has never had a CVA) who [facility] with altered mental status and generalized weakness. Daughter at bedside who helped with history taking. She states that he had a good day on Friday, but then he complained of abdominal discomfort and bloating on Saturday.   He never had nausea or vomiting.  He has not been eating or drinking well since Saturday as he says " I've been out of it.".  He has had constipation so no diarrhea.  No fevers, CP, SOB, dysuria, etc. Over the past 2 days he has become more confused. He has been brought to the ED for evaluation secondary to worsening abdominal pain and AMS.  ED work up included CT scan of his head which was negative along with a CT of his A/P which shows a distended gallbladder with layering gallstones and mild pericholecystic stranding, raising concern for acute cholecystitis along with cirrhosis. WBC 21.2, LFTs and lipase WNL, Cr 3.45.  He takes coumadin for his A fib and a history of DVTs with an INR of 2.7.  General surgery asked to see for possible cholecystitis.    ROS  See HPI otherwise all systems reviewed and otherwise negative except ambulates with a walker and does most ADLs.  Lives with his daughter.   Family History  Problem Relation Age of Onset  . Other Mother        varicose veins  . Heart disease Mother   . Hyperlipidemia Mother   . Hypertension Mother   . Varicose Veins Mother   . Cancer Father   . Diabetes Father   . Heart disease Father        before age 79  . Hyperlipidemia Father   . Hypertension Father   . Other Father        varicose veins  .  Heart attack Father   . Diabetes Daughter   . Hyperlipidemia Daughter   . Hypertension Daughter   . Other Daughter        varicose veins  . Varicose Veins Daughter   . Hypertension Son   . Diabetes Sister   . Heart disease Sister        DVT  . Other Sister        varicose veins  . Hyperlipidemia Sister   . Hypertension Sister   . Varicose Veins Sister   . Other Brother        varicose veins  . Hyperlipidemia Brother   . Hypertension Brother   . Heart attack Brother     Past Medical History:  Diagnosis Date  . Atrial fibrillation (HCC)   . CAD (coronary artery disease)   . CHF (congestive heart failure) (HCC)   . Diabetes mellitus   . DVT (deep venous thrombosis) (HCC)   . Fall at home 10/2015  . Hyperlipidemia   . Hypertension   . Left-sided carotid artery disease (HCC)   . Myocardial infarction (HCC)   . NSTEMI (non-ST elevated myocardial infarction) (HCC)   . Peripheral vascular disease (HCC)   . Stroke (HCC)   . Venous insufficiency     Past Surgical  History:  Procedure Laterality Date  . CORONARY ARTERY BYPASS GRAFT  1997   vein harvest from right leg  . CORONARY STENT INTERVENTION N/A 08/07/2016   Procedure: Coronary Stent Intervention;  Surgeon: Corky Crafts, MD;  Location[facility] INVASIVE CV LAB;  Service: Cardiovascular;  Laterality: N/A;  RCA  . ESOPHAGOGASTRODUODENOSCOPY N/A 08/12/2016   Procedure: ESOPHAGOGASTRODUODENOSCOPY (EGD);  Surgeon: Carman Ching, MD;  Location: [facility] ENDOSCOPY;  Service: Endoscopy;  Laterality: N/A;  . INGUINAL HERNIA REPAIR  1980's   Bilateral,  done in [place]  . LEFT HEART CATH AND CORS/GRAFTS ANGIOGRAPHY N/A 08/07/2016   Procedure: Left Heart Cath and Cors/Grafts Angiography;  Surgeon: Corky Crafts, MD;  Location: [facility] INVASIVE CV LAB;  Service: Cardiovascular;  Laterality: N/A;  . PR VEIN BYPASS GRAFT,AORTO-FEM-POP  1980   FEM-FEM BPG by [name]    Social History:  reports that he quit smoking about 41 years  ago. His smoking use included cigarettes. He has never used smokeless tobacco. He reports that he does not drink alcohol and does not use drugs.  Allergies: No Known Allergies  (Not in a hospital admission)   Prior to Admission medications   Medication Sig Start Date End Date Taking? Authorizing Provider  allopurinol (ZYLOPRIM) 100 MG tablet TAKE 2 TABLETS BY MOUTH DAILY. Patient taking differently: Take 100 mg by mouth 2 (two) times daily.  05/03/14   Runell Gess, MD  colchicine 0.6 MG tablet Take 0.6 mg by mouth daily as needed (gout attacks).    [provider]  docusate sodium (COLACE) 100 MG capsule Take 1 capsule (100 mg total) by mouth 2 (two) times daily. 12/16/18   Jadene Pierini, MD  ferrous sulfate 325 (65 FE) MG tablet Take 1 tablet (325 mg total) by mouth 2 (two) times daily with a meal. 08/09/16   Ghimire, Werner Lean, MD  furosemide (LASIX) 40 MG tablet Take 1 tablet (40 mg total) by mouth 2 (two) times daily. 08/24/16   Osvaldo Shipper, MD  gabapentin (NEURONTIN) 100 MG capsule Take 100 mg by mouth 3 (three) times daily with meals.  01/28/13   [provider]  gabapentin (NEURONTIN) 300 MG capsule Take 300 mg by mouth at bedtime.    [provider]  glipiZIDE (GLUCOTROL) 5 MG tablet Take 5 mg by mouth 2 (two) times daily before a meal.    [provider]  hydrALAZINE (APRESOLINE) 100 MG tablet Take 100 mg by mouth 2 (two) times daily.    [provider]  insulin aspart protamine- aspart (NOVOLOG MIX 70/30) (70-30) 100 UNIT/ML injection Inject 35 Units into the skin 3 (three) times daily as needed (CBG >200).     [provider]  isosorbide mononitrate (IMDUR) 60 MG 24 hr tablet Take 1 tablet (60 mg total) by mouth daily. Appointment needed for future refills Patient taking differently: Take 60 mg by mouth daily.  05/03/15   Runell Gess, MD  Multiple Vitamin (MULTIVITAMIN WITH MINERALS) TABS tablet Take 1 tablet by  mouth daily.    [provider]  nitroGLYCERIN (NITROSTAT) 0.4 MG SL tablet Place 0.4 mg under the tongue every 5 (five) minutes as needed for chest pain.    [provider]  oxyCODONE (OXY IR/ROXICODONE) 5 MG immediate release tablet Take 1 tablet (5 mg total) by mouth every 4 (four) hours as needed (pain). 12/16/18   Jadene Pierini, MD  pantoprazole (PROTONIX) 40 MG tablet Take 40 mg by mouth 2 (two)  times daily before a meal.     [provider]  potassium chloride (K-DUR) 10 MEQ tablet Take 1 tablet (10 mEq total) by mouth daily. 08/31/16   Barrett, Joline Salt, PA-C  pravastatin (PRAVACHOL) 40 MG tablet Take 40 mg by mouth at bedtime.     [provider]  PRESCRIPTION MEDICATION Inhale into the lungs at bedtime. CPAP    [provider]  Psyllium (METAMUCIL PO) Take 2 capsules by mouth at bedtime.    [provider]  QUEtiapine (SEROQUEL) 50 MG tablet Take 50 mg by mouth daily with supper.    [provider]    Blood pressure (!) 130/56, pulse 68, temperature (!) 100.6 F (38.1 C), temperature source Rectal, resp. rate 19, height 5\' 6"  (1.676 m), weight 95.3 kg, SpO2 99 %. Physical Exam: General: pleasant, WD/WN white male who is laying in bed in NAD HEENT: head is normocephalic, atraumatic.  Sclera are noninjected.  PERRL.  Ears and nose without any masses or lesions.  Mouth is pink and dry.  Heart: irregular in the 60s.  Normal s1,s2. No obvious murmurs, gallops, or rubs noted.  Palpable radial and pedal pulses bilaterally  Lungs: CTAB, no wheezes, rhonchi, or rales noted.  Respiratory effort nonlabored Abd: soft, ND, hypoactive BS, and very tender in his RUQ with + Murphy's sign.  He is also diffusely tender, but not as significant as RUQ. no masses or organomegaly.  + reducible umbilical hernia MS: all 4 extremities are symmetrical with no cyanosis, clubbing, or edema.  Tobacco twine noted around both ankles (supposedly helps  reduce leg pain per daughter) Skin: warm and dry with no masses, lesions, or rashes Psych: A&Ox4 but with delayed response time and lethargy Neuro: cranial nerves grossly intact, sensation normal throughout  Results for orders placed or performed during the hospital encounter of 03/14/20 (from the past 48 hour(s))  CBC with Differential     Status: Abnormal   Collection Time: 03/14/20  7:43 AM  Result Value Ref Range   WBC 21.2 (H) 4.0 - 10.5 K/uL   RBC 4.12 (L) 4.22 - 5.81 MIL/uL   Hemoglobin 13.9 13.0 - 17.0 g/dL   HCT 91.4 39 - 52 %   MCV 105.3 (H) 80.0 - 100.0 fL   MCH 33.7 26.0 - 34.0 pg   MCHC 32.0 30.0 - 36.0 g/dL   RDW 78.2 95.6 - 21.3 %   Platelets 141 (L) 150 - 400 K/uL   nRBC 0.0 0.0 - 0.2 %   Neutrophils Relative % 88 %   Neutro Abs 18.6 (H) 1.7 - 7.7 K/uL   Lymphocytes Relative 8 %   Lymphs Abs 1.7 0.7 - 4.0 K/uL   Monocytes Relative 3 %   Monocytes Absolute 0.7 0.1 - 1.0 K/uL   Eosinophils Relative 0 %   Eosinophils Absolute 0.0 0.0 - 0.5 K/uL   Basophils Relative 0 %   Basophils Absolute 0.1 0.0 - 0.1 K/uL   Immature Granulocytes 1 %   Abs Immature Granulocytes 0.20 (H) 0.00 - 0.07 K/uL    Comment: Performed at [facility], [address], [place], [place] [address]  Comprehensive metabolic panel     Status: Abnormal   Collection Time: 03/14/20  7:43 AM  Result Value Ref Range   Sodium 130 (L) 135 - 145 mmol/L   Potassium 3.5 3.5 - 5.1 mmol/L   Chloride 78 (L) 98 - 111 mmol/L   CO2 35 (H) 22 - 32 mmol/L  Glucose, Bld 306 (H) 70 - 99 mg/dL    Comment: Glucose reference range applies only to samples taken after fasting for at least 8 hours.   BUN 73 (H) 8 - 23 mg/dL   Creatinine, Ser 8.52 (H) 0.61 - 1.24 mg/dL   Calcium 77.8 8.9 - 24.2 mg/dL   Total Protein 7.8 6.5 - 8.1 g/dL   Albumin 2.8 (L) 3.5 - 5.0 g/dL   AST 24 15 - 41 U/L   ALT 13 0 - 44 U/L   Alkaline Phosphatase 82 38 - 126 U/L   Total Bilirubin 1.1 0.3 - 1.2 mg/dL   GFR, Estimated 17  (L) >60 mL/min    Comment: (NOTE) Calculated using the CKD-EPI Creatinine Equation (2021)    Anion gap 17 (H) 5 - 15    Comment: Performed at [facility], [address] [place], [place] [address]  Lactic acid, plasma     Status: Abnormal   Collection Time: 03/14/20  7:43 AM  Result Value Ref Range   Lactic Acid, Venous 2.2 (HH) 0.5 - 1.9 mmol/L    Comment: CRITICAL RESULT CALLED TO, READ BACK BY AND VERIFIED WITH: Naiomy Watters MOON,RN AT 0847 03/14/2020 BY ZBEECH. Performed at [facility], [address], [place], [place] [address]   Ammonia     Status: None   Collection Time: 03/14/20  7:44 AM  Result Value Ref Range   Ammonia 17 9 - 35 umol/L    Comment: Performed at [facility], [address] [place], [place] [address]  Brain natriuretic peptide     Status: Abnormal   Collection Time: 03/14/20  7:52 AM  Result Value Ref Range   B Natriuretic Peptide 532.7 (H) 0.0 - 100.0 pg/mL    Comment: Performed at [facility], [address], [place], [place] [address]  Respiratory Panel by RT PCR (Flu A&B, Covid) - Nasopharyngeal Swab     Status: None   Collection Time: 03/14/20  8:03 AM   Specimen: Nasopharyngeal Swab  Result Value Ref Range   SARS Coronavirus 2 by RT PCR NEGATIVE NEGATIVE    Comment: (NOTE) SARS-CoV-2 target nucleic acids are NOT DETECTED.  The SARS-CoV-2 RNA is generally detectable in upper respiratoy specimens during the acute phase of infection. The lowest concentration of SARS-CoV-2 viral copies this assay can detect is 131 copies/mL. A negative result does not preclude SARS-Cov-2 infection and should not be used as the sole basis for treatment or other patient management decisions. A negative result may occur with  improper specimen collection/handling, submission of specimen other than nasopharyngeal swab, presence of viral mutation(s) within the areas targeted by this assay, and inadequate number of viral copies (<131 copies/mL). A  negative result must be combined with clinical observations, patient history, and epidemiological information. The expected result is Negative.  Fact Sheet for Patients:  https://www.moore.com/  Fact Sheet for Healthcare Providers:  https://www.young.biz/  This test is no t yet approved or cleared by the [place] FDA and  has been authorized for detection and/or diagnosis of SARS-CoV-2 by FDA under an Emergency Use Authorization (EUA). This EUA will remain  in effect (meaning this test can be used) for the duration of the COVID-19 declaration under Section 564(b)(1) of the Act, 21 U.S.C. section 360bbb-3(b)(1), unless the authorization is terminated or revoked sooner.     Influenza A by PCR NEGATIVE NEGATIVE   Influenza B by PCR NEGATIVE NEGATIVE    Comment: (NOTE) The  Xpert Xpress SARS-CoV-2/FLU/RSV assay is intended as an aid in  the diagnosis of influenza from Nasopharyngeal swab specimens and  should not be used as a sole basis for treatment. Nasal washings and  aspirates are unacceptable for Xpert Xpress SARS-CoV-2/FLU/RSV  testing.  Fact Sheet for Patients: https://www.moore.com/  Fact Sheet for Healthcare Providers: https://www.young.biz/  This test is not yet approved or cleared by the [place] FDA and  has been authorized for detection and/or diagnosis of SARS-CoV-2 by  FDA under an Emergency Use Authorization (EUA). This EUA will remain  in effect (meaning this test can be used) for the duration of the  Covid-19 declaration under Section 564(b)(1) of the Act, 21  U.S.C. section 360bbb-3(b)(1), unless the authorization is  terminated or revoked. Performed at [facility], [address], [place], [place] [address]   Lipase, blood     Status: None   Collection Time: 03/14/20  8:18 AM  Result Value Ref Range   Lipase 35 11 - 51 U/L    Comment: Performed at [facility], [address] [place], [place] [address]  Urinalysis, Routine w reflex microscopic     Status: Abnormal   Collection Time: 03/14/20  8:40 AM  Result Value Ref Range   Color, Urine YELLOW YELLOW   APPearance HAZY (A) CLEAR   Specific Gravity, Urine >1.030 (H) 1.005 - 1.030   pH 5.0 5.0 - 8.0   Glucose, UA NEGATIVE NEGATIVE mg/dL   Hgb urine dipstick TRACE (A) NEGATIVE   Bilirubin Urine NEGATIVE NEGATIVE   Ketones, ur NEGATIVE NEGATIVE mg/dL   Protein, ur 324 (A) NEGATIVE mg/dL   Nitrite NEGATIVE NEGATIVE   Leukocytes,Ua NEGATIVE NEGATIVE    Comment: Performed at [facility], [address], [place], [place] [address]  Urinalysis, Microscopic (reflex)     Status: Abnormal   Collection Time: 03/14/20  8:40 AM  Result Value Ref Range   RBC / HPF 6-10 0 - 5 RBC/hpf   WBC, UA 0-5 0 - 5 WBC/hpf   Bacteria, UA RARE (A) NONE SEEN   Squamous Epithelial / LPF 0-5 0 - 5   Non Squamous Epithelial PRESENT (A) NONE SEEN   Hyaline Casts, UA PRESENT    Granular Casts, UA PRESENT    Amorphous Crystal PRESENT     Comment: Performed at [facility], [address] [place], [place] [address]  Protime-INR     Status: Abnormal   Collection Time: 03/14/20  9:11 AM  Result Value Ref Range   Prothrombin Time 28.1 (H) 11.4 - 15.2 seconds   INR 2.7 (H) 0.8 - 1.2    Comment: (NOTE) INR goal varies based on device and disease states. Performed at [facility], [address] [place], [place] [address]   APTT     Status: Abnormal   Collection Time: 03/14/20  9:11 AM  Result Value Ref Range   aPTT 44 (H) 24 - 36 seconds    Comment:        IF BASELINE aPTT IS ELEVATED, SUGGEST PATIENT RISK ASSESSMENT BE USED TO DETERMINE APPROPRIATE ANTICOAGULANT THERAPY. Performed at [facility], [address], [place], [place] [address]    CT ABDOMEN PELVIS WO CONTRAST  Result Date: 03/14/2020 CLINICAL DATA:  Right upper quadrant abdominal pain EXAM: CT ABDOMEN AND PELVIS  WITHOUT CONTRAST TECHNIQUE: Multidetector CT imaging of the abdomen and pelvis was performed following the standard protocol without IV contrast. COMPARISON:  12/08/2018 FINDINGS: Motion degraded images. Lower chest: Trace bilateral pleural effusions. Associated lower lobe opacities, likely atelectasis. Hepatobiliary: Cirrhosis.  No focal hepatic lesion is seen. Distended gallbladder with layering gallstones (series 3/image 36). Mild pericholecystic stranding (series 3/image 40), raising concern for acute cholecystitis. No intrahepatic or extrahepatic ductal dilatation. Pancreas: Within normal limits. Spleen: Within normal limits. Adrenals/Urinary Tract: Adrenal glands are within normal limits. 2.3 cm medial right lower pole renal cyst (series 3/image 45). Bilateral kidneys are otherwise unremarkable, noting renal vascular calcifications. No hydronephrosis. Mildly thick-walled bladder, although underdistended. Stomach/Bowel: Stomach is within normal limits. No evidence of bowel obstruction. Appendix is not discretely visualized. Scattered sigmoid diverticulosis, without evidence of diverticulitis. Vascular/Lymphatic: No evidence of abdominal aortic aneurysm. Extensive atherosclerotic calcifications of the abdominal aorta and branch vessels. No suspicious abdominopelvic lymphadenopathy. Reproductive: Prostate is unremarkable. Other: No abdominopelvic ascites. Musculoskeletal: Degenerative changes of the visualized thoracolumbar spine. Posterior thoracic fixation ending at L1, incompletely visualized. Median sternotomy. IMPRESSION: Motion degraded images. Distended gallbladder with layering gallstones and mild pericholecystic stranding, raising concern for acute cholecystitis. Additional ancillary findings as above. Electronically Signed   By: Charline Bills M.D.   On: 03/14/2020 09:50   CT Head Wo Contrast  Result Date: 03/14/2020 CLINICAL DATA:  Altered mental status/delirium EXAM: CT HEAD WITHOUT CONTRAST  TECHNIQUE: Contiguous axial images were obtained from the base of the skull through the vertex without intravenous contrast. COMPARISON:  08/11/2016 FINDINGS: Brain: No evidence of acute infarction, hemorrhage, hydrocephalus, extra-axial collection or mass lesion/mass effect. Global cortical atrophy. Subcortical white matter and periventricular small vessel ischemic changes. Vascular: Intracranial atherosclerosis. Skull: Normal. Negative for fracture or focal lesion. Sinuses/Orbits: The visualized paranasal sinuses are essentially clear. The mastoid air cells are unopacified. Other: None. IMPRESSION: No evidence of acute intracranial abnormality. Atrophy with small vessel ischemic changes. Electronically Signed   By: Charline Bills M.D.   On: 03/14/2020 09:36   DG Chest Portable 1 View  Result Date: 03/14/2020 CLINICAL DATA:  Shortness of breath and fatigue EXAM: PORTABLE CHEST 1 VIEW COMPARISON:  12/08/2018 FINDINGS: Signs of previous CABG procedure. Aortic atherosclerotic calcifications. Heart size normal. Small pleural effusions are suspected with blunting of the costophrenic angles. Pulmonary vascular congestion. IMPRESSION: Suspect small bilateral pleural effusions and pulmonary vascular congestion. No signs of pneumonia. Electronically Signed   By: Signa Kell M.D.   On: 03/14/2020 07:02      Assessment/Plan CAD - followed by Dr. Allyson Sabal CHF - ECHO 07/2016 EF 55-65% A Fib on coumadin - INR 2.7.  D/w primary service who is going to reverse today with  of IV Vit K and recheck in INR in am  Hx DVT DM HTN HLD PVD Hx CVA Obesity BMI 33.89  Acute cholecystitis The patient clinically and radiographically has cholecystitis.  He is very tender in the RUQ.  Maxipime and flagyl has been started.  I have discussed him with IR who is agreeable to place a perc chole tube given his current medical state and other co-morbidities.  His INR will need to be reversed prior to placing his tube since he  is otherwise hemodynamically stable at this time.  He will be given  of Vit K today with repeat INR in the morning.  Would keep him NPO except some sips and chips today and NPO x meds overnight for procedure in the am.  This plan has been discussed with all members of the team as well as the patient and his daughter.  They understand the reasoning for  a perc chole drain as well as the plan for cardiac preoperative clearance as an outpatient with likely plans to proceed with interval lap chole in 6-8 weeks if cleared.  ID - maxipime/flagyl VTE - reverse INR with Vit K FEN - IVF, NPO Foley - none Follow up - TBD  Letha Cape, [facility] 03/14/2020, 11:24 AM Please see Amion for pager number during day hours 7:00am-4:30pm

## 2020-03-14 NOTE — Progress Notes (Signed)
Pharmacy Antibiotic Note  Isaiah [name] is a 79 y.o. male admitted on 03/14/2020 with intra-abdominal infection .  Pharmacy has been consulted for Cefepime dosing. WBC elevated at 21.2, LA 2.2, SCr elevated at 3.45 (BL ~ 1.6).   Plan: -Start Cefepime 2 gm IV Q 24 hours -Monitor CBC, renal fx, cultures and clinical progress     Temp (24hrs), Avg:98.5 F (36.9 C), Min:98.5 F (36.9 C), Max:98.5 F (36.9 C)  No results for input(s): WBC, CREATININE, LATICACIDVEN, VANCOTROUGH, VANCOPEAK, VANCORANDOM, GENTTROUGH, GENTPEAK, GENTRANDOM, TOBRATROUGH, TOBRAPEAK, TOBRARND, AMIKACINPEAK, AMIKACINTROU, AMIKACIN in the last 168 hours.  CrCl cannot be calculated (Patient's most recent lab result is older than the maximum 21 days allowed.).    No Known Allergies  Antimicrobials this admission: Cefepime 11/1 >>   Dose adjustments this admission:  Microbiology results: 11/1 BCx:  11/1 UCx:    Thank you for allowing pharmacy to be a part of this patient's care.  Vinnie Level, PharmD., BCPS, BCCCP Clinical Pharmacist Please refer to [facility] for unit-specific pharmacist

## 2020-03-14 NOTE — H&P (Signed)
History and Physical    Isaiah [name] EAV:[id number] DOB: Sep 19, 1940 DOA: 03/14/2020  PCP: [place], [name], MD Consultants:  Maurice Small - neurosurgery; Allyson Sabal - cardiology Patient coming from:  Home - lives with daughter Banker at [facility]) and her husband and children; NOK: Daughter, [phone]  Chief Complaint: Weakness, pressure ulcer  HPI: Isaiah [name] is a 79 y.o. male with medical history significant of PVD; CAD; HTN; HLD; DM; afib on Coumadin; and CHF presenting with weakness and a buttock pressure ulcer.  Everything was normal Friday, it was a great day.  Saturday AM, he was complaining of gas and bloating.  He sat in his lift chair during the day.  That evening, he was somewhat confused.  He didn't eat that day.  Sunday, he slept all day.  She tried to get him up and he was too weak to stand, mumbling, confused.  He is having RUQ abdominal pain.  No fever at home, although he was flushed.  He has had a pressure ulcer since back surgery a year ago, has a scab over it and it pulled off.      ED Course:  Concern for sepsis, AKI, likely cholecystitis.  Abdominal pain Friday, delirium and weakness over the weekend.  Febrile to 100.6, hypoxic on arrival.  Significant RUQ TTP.  Labs with AKI, leukocytosis.  CT with concern for gallbladder pathology.  Surgery to evaluate.  ?volume overload so not given 30 cc/kg bolus.  New cirrhosis on CT, negative NH4.  Myoclonic jerking, likely encephalopathy plus Neurontin with AKI.  Review of Systems: As per HPI; otherwise review of systems reviewed and negative.   Ambulatory Status:  Ambulates with a walker  COVID Vaccine Status:  Complete  Past Medical History:  Diagnosis Date  . Atrial fibrillation (HCC)    on Coumadin  . CAD (coronary artery disease)    s/p remote CABG, stent in 2019  . CHF (congestive heart failure) (HCC)   . Diabetes mellitus   . DVT (deep venous thrombosis) (HCC)   . Fall at home 10/2015  . Hyperlipidemia   .  Hypertension   . Left-sided carotid artery disease (HCC)   . Peripheral vascular disease (HCC)   . Stroke (HCC)   . Venous insufficiency     Past Surgical History:  Procedure Laterality Date  . CORONARY ARTERY BYPASS GRAFT  1997   vein harvest from right leg  . CORONARY STENT INTERVENTION N/A 08/07/2016   Procedure: Coronary Stent Intervention;  Surgeon: Corky Crafts, MD;  Location: [facility] INVASIVE CV LAB;  Service: Cardiovascular;  Laterality: N/A;  RCA  . ESOPHAGOGASTRODUODENOSCOPY N/A 08/12/2016   Procedure: ESOPHAGOGASTRODUODENOSCOPY (EGD);  Surgeon: Carman Ching, MD;  Location: [facility] ENDOSCOPY;  Service: Endoscopy;  Laterality: N/A;  . INGUINAL HERNIA REPAIR  1980's   Bilateral,  done in [place]  . LEFT HEART CATH AND CORS/GRAFTS ANGIOGRAPHY N/A 08/07/2016   Procedure: Left Heart Cath and Cors/Grafts Angiography;  Surgeon: Corky Crafts, MD;  Location: [facility] INVASIVE CV LAB;  Service: Cardiovascular;  Laterality: N/A;  . PR VEIN BYPASS GRAFT,AORTO-FEM-POP  1980   FEM-FEM BPG by [name]    Social History   Socioeconomic History  . Marital status: Divorced    Spouse name: Not on file  . Number of children: Not on file  . Years of education: Not on file  . Highest education level: Not on file  Occupational History  . Not on file  Tobacco Use  . Smoking status:  Former Smoker    Types: Cigarettes    Quit date: 05/14/1978    Years since quitting: 41.8  . Smokeless tobacco: Never Used  Substance and Sexual Activity  . Alcohol use: No  . Drug use: No  . Sexual activity: Not on file  Other Topics Concern  . Not on file  Social History Narrative  . Not on file   Social Determinants of Health   Financial Resource Strain:   . Difficulty of Paying Living Expenses: Not on file  Food Insecurity:   . Worried About Programme researcher, broadcasting/film/video in the Last Year: Not on file  . Ran Out of Food in the Last Year: Not on file  Transportation Needs:   . Lack of Transportation  (Medical): Not on file  . Lack of Transportation (Non-Medical): Not on file  Physical Activity:   . Days of Exercise per Week: Not on file  . Minutes of Exercise per Session: Not on file  Stress:   . Feeling of Stress : Not on file  Social Connections:   . Frequency of Communication with Friends and Family: Not on file  . Frequency of Social Gatherings with Friends and Family: Not on file  . Attends Religious Services: Not on file  . Active Member of Clubs or Organizations: Not on file  . Attends Banker Meetings: Not on file  . Marital Status: Not on file  Intimate Partner Violence:   . Fear of Current or Ex-Partner: Not on file  . Emotionally Abused: Not on file  . Physically Abused: Not on file  . Sexually Abused: Not on file    No Known Allergies  Family History  Problem Relation Age of Onset  . Other Mother        varicose veins  . Heart disease Mother   . Hyperlipidemia Mother   . Hypertension Mother   . Varicose Veins Mother   . Cancer Father   . Diabetes Father   . Heart disease Father        before age 12  . Hyperlipidemia Father   . Hypertension Father   . Other Father        varicose veins  . Heart attack Father   . Diabetes Daughter   . Hyperlipidemia Daughter   . Hypertension Daughter   . Other Daughter        varicose veins  . Varicose Veins Daughter   . Hypertension Son   . Diabetes Sister   . Heart disease Sister        DVT  . Other Sister        varicose veins  . Hyperlipidemia Sister   . Hypertension Sister   . Varicose Veins Sister   . Other Brother        varicose veins  . Hyperlipidemia Brother   . Hypertension Brother   . Heart attack Brother     Prior to Admission medications   Medication Sig Start Date End Date Taking? Authorizing Provider  allopurinol (ZYLOPRIM) 100 MG tablet TAKE 2 TABLETS BY MOUTH DAILY. Patient taking differently: Take 100 mg by mouth 2 (two) times daily.  05/03/14   Runell Gess, MD    colchicine 0.6 MG tablet Take 0.6 mg by mouth daily as needed (gout attacks).    [provider]  docusate sodium (COLACE) 100 MG capsule Take 1 capsule (100 mg total) by mouth 2 (two) times daily. 12/16/18   Jadene Pierini, MD  ferrous  sulfate 325 (65 FE) MG tablet Take 1 tablet (325 mg total) by mouth 2 (two) times daily with a meal. 08/09/16   Ghimire, Werner Lean, MD  furosemide (LASIX) 40 MG tablet Take 1 tablet (40 mg total) by mouth 2 (two) times daily. 08/24/16   Osvaldo Shipper, MD  gabapentin (NEURONTIN) 100 MG capsule Take 100 mg by mouth 3 (three) times daily with meals.  01/28/13   [provider]  gabapentin (NEURONTIN) 300 MG capsule Take 300 mg by mouth at bedtime.    [provider]  glipiZIDE (GLUCOTROL) 5 MG tablet Take 5 mg by mouth 2 (two) times daily before a meal.    [provider]  hydrALAZINE (APRESOLINE) 100 MG tablet Take 100 mg by mouth 2 (two) times daily.    [provider]  insulin aspart protamine- aspart (NOVOLOG MIX 70/30) (70-30) 100 UNIT/ML injection Inject 35 Units into the skin 3 (three) times daily as needed (CBG >200).     [provider]  isosorbide mononitrate (IMDUR) 60 MG 24 hr tablet Take 1 tablet (60 mg total) by mouth daily. Appointment needed for future refills Patient taking differently: Take 60 mg by mouth daily.  05/03/15   Runell Gess, MD  Multiple Vitamin (MULTIVITAMIN WITH MINERALS) TABS tablet Take 1 tablet by mouth daily.    [provider]  nitroGLYCERIN (NITROSTAT) 0.4 MG SL tablet Place 0.4 mg under the tongue every 5 (five) minutes as needed for chest pain.    [provider]  oxyCODONE (OXY IR/ROXICODONE) 5 MG immediate release tablet Take 1 tablet (5 mg total) by mouth every 4 (four) hours as needed (pain). 12/16/18   Jadene Pierini, MD  pantoprazole (PROTONIX) 40 MG tablet Take 40 mg by mouth 2 (two) times daily before a meal.     [provider]   potassium chloride (K-DUR) 10 MEQ tablet Take 1 tablet (10 mEq total) by mouth daily. 08/31/16   Barrett, Joline Salt, PA-C  pravastatin (PRAVACHOL) 40 MG tablet Take 40 mg by mouth at bedtime.     [provider]  PRESCRIPTION MEDICATION Inhale into the lungs at bedtime. CPAP    [provider]  Psyllium (METAMUCIL PO) Take 2 capsules by mouth at bedtime.    [provider]  QUEtiapine (SEROQUEL) 50 MG tablet Take 50 mg by mouth daily with supper.    [provider]    Physical Exam: Vitals:   03/14/20 1315 03/14/20 1415 03/14/20 1418 03/14/20 1817  BP: (!) 121/104 (!) 104/53  (!) 134/114  Pulse: 79 61 68 69  Resp: 16 (!) 22 20 20   Temp:    99.1 F (37.3 C)  TempSrc:    Oral  SpO2: 97% 98% 97% 98%  Weight:      Height:         . General:  Appears calm and comfortable and is NAD . Eyes:  PERRL, EOMI, normal lids, iris . ENT:  grossly normal hearing, lips & tongue, moderately dry mm; suboptimal dentition . Neck:  no LAD, masses or thyromegaly . Cardiovascular:  RRR, no m/r/g. No LE edema.  Respiratory:   CTA bilaterally with no wheezes/rales/rhonchi.  Normal respiratory effort. . Abdomen:  soft, diffusely tender but exquisitely so in the RUQ . Skin:  Nurse daughter reports stage 2 pressure ulcer on B buttocks - unable to examine patient due to abdominal pain when we tried to roll him . Musculoskeletal:  Mildly decreased tone BUE <  BLE, good ROM, no bony abnormality . Psychiatric:  grossly normal mood and affect, speech fluent and appropriate, AOx3 . Neurologic:  CN 2-12 grossly intact, moves all extremities in coordinated fashion    Radiological Exams on Admission: Independently reviewed - see discussion in A/P where applicable  CT ABDOMEN PELVIS WO CONTRAST  Result Date: 03/14/2020 CLINICAL DATA:  Right upper quadrant abdominal pain EXAM: CT ABDOMEN AND PELVIS WITHOUT CONTRAST TECHNIQUE: Multidetector CT imaging of the abdomen and pelvis  was performed following the standard protocol without IV contrast. COMPARISON:  12/08/2018 FINDINGS: Motion degraded images. Lower chest: Trace bilateral pleural effusions. Associated lower lobe opacities, likely atelectasis. Hepatobiliary: Cirrhosis.  No focal hepatic lesion is seen. Distended gallbladder with layering gallstones (series 3/image 36). Mild pericholecystic stranding (series 3/image 40), raising concern for acute cholecystitis. No intrahepatic or extrahepatic ductal dilatation. Pancreas: Within normal limits. Spleen: Within normal limits. Adrenals/Urinary Tract: Adrenal glands are within normal limits. 2.3 cm medial right lower pole renal cyst (series 3/image 45). Bilateral kidneys are otherwise unremarkable, noting renal vascular calcifications. No hydronephrosis. Mildly thick-walled bladder, although underdistended. Stomach/Bowel: Stomach is within normal limits. No evidence of bowel obstruction. Appendix is not discretely visualized. Scattered sigmoid diverticulosis, without evidence of diverticulitis. Vascular/Lymphatic: No evidence of abdominal aortic aneurysm. Extensive atherosclerotic calcifications of the abdominal aorta and branch vessels. No suspicious abdominopelvic lymphadenopathy. Reproductive: Prostate is unremarkable. Other: No abdominopelvic ascites. Musculoskeletal: Degenerative changes of the visualized thoracolumbar spine. Posterior thoracic fixation ending at L1, incompletely visualized. Median sternotomy. IMPRESSION: Motion degraded images. Distended gallbladder with layering gallstones and mild pericholecystic stranding, raising concern for acute cholecystitis. Additional ancillary findings as above. Electronically Signed   By: Charline Bills M.D.   On: 03/14/2020 09:50   CT Head Wo Contrast  Result Date: 03/14/2020 CLINICAL DATA:  Altered mental status/delirium EXAM: CT HEAD WITHOUT CONTRAST TECHNIQUE: Contiguous axial images were obtained from the base of the skull  through the vertex without intravenous contrast. COMPARISON:  08/11/2016 FINDINGS: Brain: No evidence of acute infarction, hemorrhage, hydrocephalus, extra-axial collection or mass lesion/mass effect. Global cortical atrophy. Subcortical white matter and periventricular small vessel ischemic changes. Vascular: Intracranial atherosclerosis. Skull: Normal. Negative for fracture or focal lesion. Sinuses/Orbits: The visualized paranasal sinuses are essentially clear. The mastoid air cells are unopacified. Other: None. IMPRESSION: No evidence of acute intracranial abnormality. Atrophy with small vessel ischemic changes. Electronically Signed   By: Charline Bills M.D.   On: 03/14/2020 09:36   DG Chest Portable 1 View  Result Date: 03/14/2020 CLINICAL DATA:  Shortness of breath and fatigue EXAM: PORTABLE CHEST 1 VIEW COMPARISON:  12/08/2018 FINDINGS: Signs of previous CABG procedure. Aortic atherosclerotic calcifications. Heart size normal. Small pleural effusions are suspected with blunting of the costophrenic angles. Pulmonary vascular congestion. IMPRESSION: Suspect small bilateral pleural effusions and pulmonary vascular congestion. No signs of pneumonia. Electronically Signed   By: Signa Kell M.D.   On: 03/14/2020 07:02    EKG: Independently reviewed.  Accelerated junctional rhythm with rate 85; nonspecific ST changes with no evidence of acute ischemia   Labs on Admission: I have personally reviewed the available labs and imaging studies at the time of the admission.  Pertinent labs:   Na++ 130 CO2 35 Glucose 30 BUN 73/Creatinine 3.45/GFR 17; 28/1.64/39 in 12/2018 BNP 523.7 Lactate 2.2 WBC 21.2 Platelets 141 UA: trace Hgb, 100 protein, rare bacteria COVID/flu negative   Assessment/Plan Principal Problem:   Sepsis due to undetermined organism [facility]) Active Problems:   Coronary artery disease  Chronic atrial fibrillation (HCC)   Essential hypertension   Hyperlipidemia   Type 2  diabetes mellitus with vascular disease (HCC)   Obstructive sleep apnea   CKD (chronic kidney disease), stage III (HCC)   ARF (acute renal failure) (HCC)   Pressure injury of skin   Acute cholecystitis   Class 1 obesity due to excess calories with body mass index (BMI) of 33.0 to 33.9 in adult   Sepsis due to cholecystitis -Sepsis indicates life-threatening organ dysfunction with mortality >10%, caused by dysregulation to host response.   -SIRS criteria in this patient includes: Leukocytosis, fever -Patient has evidence of acute organ failure with elevated lactate >2; encephalopathy; creatinine >2 that is not easily explained by another condition. -While awaiting blood cultures, this appears to be a preseptic condition. -Sepsis protocol initiated -Worse outcomes are predicted from sepsis with 2 of the following: RR > 22; AMS , GCS < 13; or SBP <100.  This patient meets 1 of these criteria; his AMS has since resolved. -Suspected source is cholecystitis - he has marked RUQ TTP and abnormal findings on CT. -Blood and urine cultures pending --Will admit due to: dehydration that is severe as evidenced by acute renal failure -Treat with IV Cefepime/Flagyl for intraabdominal infection -Will trend lactate to ensure improvement -Will order sepsis protocol procalcitonin level.  Antibiotics would not be indicated for PCT <0.1 and probably should not be used for < 0.25.  >0.5 indicates infection and >>0.5 indicates more serious disease.  As the procalcitonin level normalizes, it will be reasonable to consider de-escalation of antibiotic coverage.  Acute cholecystitis -Patient with severe RUQ pain -Imaging shows a distended gallbladder with layering gallstones and mild pericholecystic stranding -Will keep NPO  -Morphine for pain, Zofran for nausea -Surgery has seen and recommends perc drain -IR will plan to place perc drain tomorrow, once INR is reversed  Acute renal failure -Baseline creatinine is  1.64/GFR 39 -Today's creatinine is  3.45/GFR 17 -Likely due to prerenal failure secondary to dehydration and infection with sepsis, decreased PO intake with continuation of Lasix and Zaroxolyn -Hold diuretics for now -IVF repletion -Follow up renal function by BMP -Avoid ACEI and NSAIDs  Pressure injury -Daughter is an Charity fundraiser and reports grade 2 pressure ulcer on B buttocks -We attempted to roll the patient to examine this and he had excruciating abdominal pain -Will hold evaluation until pain is controlled -Wound care consult requested  CAD -Continue Imdur -No complaint of CP at this time  Afib, on Coumadin -Patient was given 10 mg IV Vitamin K for INR reversal pre-procedure -Will repeat INR in the AM  HTN -Continue hydralazine  HLD -Continue Pravachol  Dementia -Mild baseline cognitive impairment -Worsening impairment with current illness -He was A&O x 3 at the time of my evaluation -Continue Seroquel  DM -Will check A1c; it was 10 in 11/2018, indicating very poor control -hold Glucotrol -He appears to use 70/30 on a prn basis; will hold for now -Cover with moderate-scale SSI -Diabetes coordinator consulted  OSA -Continue CPAP  Obesity -Body mass index is 33.89 kg/m. -Weight loss should be encouraged -Outpatient PCP/bariatric medicine f/u encouraged     Note: This patient has been tested and is negative for the novel coronavirus COVID-19. She has been fully vaccinated against COVID-19.    DVT prophylaxis:  SCDs, reversing Coumadin Code Status:  Full - confirmed with patient/family Family Communication: Daughter present throughout evaluation  Disposition Plan:  The patient is from: home  Anticipated d/c is to:  home [facility]   Anticipated d/c date will depend on clinical response to treatment, but likely 2-3 days  Patient is currently: acutely ill Consults called: Surgery, IR, wound care Admission status: Admit - It is my clinical opinion that  admission to INPATIENT is reasonable and necessary because of the expectation that this patient will require hospital care that crosses at least 2 midnights to treat this condition based on the medical complexity of the problems presented.  Given the aforementioned information, the predictability of an adverse outcome is felt to be significant.     Jonah BlueJennifer Evanne Matsunaga MD [facility]   How to contact the [facility] Attending or Consulting provider 7A - 7P or covering provider during after hours 7P -7A, for this patient?  1. Check the care team in [facility] and look for a) attending/consulting TR[facility] provider listed and b) the [facility] team listed 2. Log into [email or web address] and use [place]'s universal password to access. If you do not have the password, please contact the hospital operator. 3. Locate the [facility] provider you are looking for under [facility] and page to a number that you can be directly reached. 4. If you still have difficulty reaching the provider, please page the [facility] (Director on Call) for the Hospitalists listed on amion for assistance.   03/14/2020, 7:15 PM

## 2020-03-15 ENCOUNTER — Encounter (HOSPITAL_COMMUNITY): Payer: Self-pay | Admitting: Internal Medicine

## 2020-03-15 ENCOUNTER — Telehealth (HOSPITAL_COMMUNITY): Payer: Self-pay | Admitting: *Deleted

## 2020-03-15 ENCOUNTER — Inpatient Hospital Stay (HOSPITAL_COMMUNITY): Payer: Medicare Other | Admitting: Anesthesiology

## 2020-03-15 ENCOUNTER — Inpatient Hospital Stay (HOSPITAL_COMMUNITY): Payer: Medicare Other

## 2020-03-15 ENCOUNTER — Encounter (HOSPITAL_COMMUNITY)
Admission: EM | Disposition: A | Discharge status: Home Health | Payer: Self-pay | Source: Home / Self Care | Attending: Internal Medicine

## 2020-03-15 DIAGNOSIS — K810 Acute cholecystitis: Secondary | ICD-10-CM | POA: Diagnosis not present

## 2020-03-15 DIAGNOSIS — A419 Sepsis, unspecified organism: Secondary | ICD-10-CM | POA: Diagnosis not present

## 2020-03-15 DIAGNOSIS — R579 Shock, unspecified: Secondary | ICD-10-CM | POA: Diagnosis not present

## 2020-03-15 DIAGNOSIS — I469 Cardiac arrest, cause unspecified: Secondary | ICD-10-CM

## 2020-03-15 DIAGNOSIS — N179 Acute kidney failure, unspecified: Secondary | ICD-10-CM

## 2020-03-15 HISTORY — PX: RADIOLOGY WITH ANESTHESIA: SHX6223

## 2020-03-15 LAB — BLOOD GAS, ARTERIAL
Acid-Base Excess: 12 mmol/L — ABNORMAL HIGH (ref 0.0–2.0)
Bicarbonate: 36.8 mmol/L — ABNORMAL HIGH (ref 20.0–28.0)
Drawn by: 30136
FIO2: 40
O2 Saturation: 98.8 %
Patient temperature: 37
pCO2 arterial: 54.8 mmHg — ABNORMAL HIGH (ref 32.0–48.0)
pH, Arterial: 7.442 (ref 7.350–7.450)
pO2, Arterial: 128 mmHg — ABNORMAL HIGH (ref 83.0–108.0)

## 2020-03-15 LAB — GLUCOSE, CAPILLARY
Glucose-Capillary: 233 mg/dL — ABNORMAL HIGH (ref 70–99)
Glucose-Capillary: 263 mg/dL — ABNORMAL HIGH (ref 70–99)
Glucose-Capillary: 250 mg/dL — ABNORMAL HIGH (ref 70–99)
Glucose-Capillary: 233 mg/dL — ABNORMAL HIGH (ref 70–99)
Glucose-Capillary: 319 mg/dL — ABNORMAL HIGH (ref 70–99)

## 2020-03-15 LAB — CBC
HCT: 36.8 % — ABNORMAL LOW (ref 39.0–52.0)
HCT: 39.3 % (ref 39.0–52.0)
Hemoglobin: 11.8 g/dL — ABNORMAL LOW (ref 13.0–17.0)
Hemoglobin: 12.7 g/dL — ABNORMAL LOW (ref 13.0–17.0)
MCH: 33.6 pg (ref 26.0–34.0)
MCH: 33.7 pg (ref 26.0–34.0)
MCHC: 32.1 g/dL (ref 30.0–36.0)
MCHC: 32.3 g/dL (ref 30.0–36.0)
MCV: 104 fL — ABNORMAL HIGH (ref 80.0–100.0)
MCV: 105.1 fL — ABNORMAL HIGH (ref 80.0–100.0)
Platelets: 127 10*3/uL — ABNORMAL LOW (ref 150–400)
Platelets: 142 10*3/uL — ABNORMAL LOW (ref 150–400)
RBC: 3.5 MIL/uL — ABNORMAL LOW (ref 4.22–5.81)
RBC: 3.78 MIL/uL — ABNORMAL LOW (ref 4.22–5.81)
RDW: 13.7 % (ref 11.5–15.5)
RDW: 13.8 % (ref 11.5–15.5)
WBC: 17.1 10*3/uL — ABNORMAL HIGH (ref 4.0–10.5)
WBC: 21.3 10*3/uL — ABNORMAL HIGH (ref 4.0–10.5)
nRBC: 0 % (ref 0.0–0.2)
nRBC: 0 % (ref 0.0–0.2)

## 2020-03-15 LAB — BASIC METABOLIC PANEL
Anion gap: 12 (ref 5–15)
Anion gap: 11 (ref 5–15)
BUN: 83 mg/dL — ABNORMAL HIGH (ref 8–23)
BUN: 85 mg/dL — ABNORMAL HIGH (ref 8–23)
CO2: 33 mmol/L — ABNORMAL HIGH (ref 22–32)
CO2: 34 mmol/L — ABNORMAL HIGH (ref 22–32)
Calcium: 9.2 mg/dL (ref 8.9–10.3)
Calcium: 8.4 mg/dL — ABNORMAL LOW (ref 8.9–10.3)
Chloride: 86 mmol/L — ABNORMAL LOW (ref 98–111)
Chloride: 90 mmol/L — ABNORMAL LOW (ref 98–111)
Creatinine, Ser: 2.75 mg/dL — ABNORMAL HIGH (ref 0.61–1.24)
Creatinine, Ser: 2.53 mg/dL — ABNORMAL HIGH (ref 0.61–1.24)
GFR, Estimated: 23 mL/min — ABNORMAL LOW (ref >60)
GFR, Estimated: 25 mL/min — ABNORMAL LOW (ref >60)
Glucose, Bld: 207 mg/dL — ABNORMAL HIGH (ref 70–99)
Glucose, Bld: 198 mg/dL — ABNORMAL HIGH (ref 70–99)
Potassium: 3 mmol/L — ABNORMAL LOW (ref 3.5–5.1)
Potassium: 3.3 mmol/L — ABNORMAL LOW (ref 3.5–5.1)
Sodium: 131 mmol/L — ABNORMAL LOW (ref 135–145)
Sodium: 135 mmol/L (ref 135–145)

## 2020-03-15 LAB — POCT I-STAT 7, (LYTES, BLD GAS, ICA,H+H)
Acid-Base Excess: 4 mmol/L — ABNORMAL HIGH (ref 0.0–2.0)
Bicarbonate: 27.2 mmol/L (ref 20.0–28.0)
Calcium, Ion: 0.95 mmol/L — ABNORMAL LOW (ref 1.15–1.40)
HCT: 32 % — ABNORMAL LOW (ref 39.0–52.0)
Hemoglobin: 10.9 g/dL — ABNORMAL LOW (ref 13.0–17.0)
O2 Saturation: 100 %
Patient temperature: 99.6
Potassium: 2.6 mmol/L — CL (ref 3.5–5.1)
Sodium: 140 mmol/L (ref 135–145)
TCO2: 28 mmol/L (ref 22–32)
pCO2 arterial: 37.9 mmHg (ref 32.0–48.0)
pH, Arterial: 7.467 — ABNORMAL HIGH (ref 7.350–7.450)
pO2, Arterial: 262 mmHg — ABNORMAL HIGH (ref 83.0–108.0)

## 2020-03-15 LAB — URINE CULTURE: Culture: NO GROWTH

## 2020-03-15 LAB — PROTIME-INR
INR: 1.5 — ABNORMAL HIGH (ref 0.8–1.2)
INR: 1.5 — ABNORMAL HIGH (ref 0.8–1.2)
Prothrombin Time: 17.4 seconds — ABNORMAL HIGH (ref 11.4–15.2)
Prothrombin Time: 17.1 seconds — ABNORMAL HIGH (ref 11.4–15.2)

## 2020-03-15 LAB — MAGNESIUM
Magnesium: 2.6 mg/dL — ABNORMAL HIGH (ref 1.7–2.4)
Magnesium: 2.5 mg/dL — ABNORMAL HIGH (ref 1.7–2.4)

## 2020-03-15 LAB — MRSA PCR SCREENING: MRSA by PCR: NEGATIVE

## 2020-03-15 LAB — TROPONIN I (HIGH SENSITIVITY)
Troponin I (High Sensitivity): 61 ng/L — ABNORMAL HIGH (ref <18)
Troponin I (High Sensitivity): 308 ng/L (ref <18)

## 2020-03-15 LAB — LACTIC ACID, PLASMA
Lactic Acid, Venous: 0.9 mmol/L (ref 0.5–1.9)
Lactic Acid, Venous: 2.6 mmol/L (ref 0.5–1.9)
Lactic Acid, Venous: 2.8 mmol/L (ref 0.5–1.9)

## 2020-03-15 LAB — APTT: aPTT: 35 seconds (ref 24–36)

## 2020-03-15 SURGERY — IR WITH ANESTHESIA
Anesthesia: General

## 2020-03-15 MED ORDER — PIPERACILLIN-TAZOBACTAM 3.375 G IVPB
3.3750 g | Freq: Three times a day (TID) | INTRAVENOUS | Status: AC
  Administered 2020-03-15 – 2020-03-18 (×9): 3.375 g via INTRAVENOUS
  Filled 2020-03-15 (×11): qty 50

## 2020-03-15 MED ORDER — PHENYLEPHRINE HCL-NACL 10-0.9 MG/250ML-% IV SOLN
INTRAVENOUS | Status: AC
  Filled 2020-03-15: qty 250

## 2020-03-15 MED ORDER — FENTANYL 2500MCG IN NS 250ML (10MCG/ML) PREMIX INFUSION
0.0000 ug/h | INTRAVENOUS | Status: DC
  Administered 2020-03-15: 50 ug/h via INTRAVENOUS
  Filled 2020-03-15: qty 250

## 2020-03-15 MED ORDER — PHENYLEPHRINE HCL-NACL 10-0.9 MG/250ML-% IV SOLN
INTRAVENOUS | Status: DC | PRN
  Administered 2020-03-15: 40 ug/min via INTRAVENOUS

## 2020-03-15 MED ORDER — ORAL CARE MOUTH RINSE
15.0000 mL | Freq: Two times a day (BID) | OROMUCOSAL | Status: DC
  Administered 2020-03-15 (×2): 15 mL via OROMUCOSAL

## 2020-03-15 MED ORDER — DOCUSATE SODIUM 50 MG/5ML PO LIQD
100.0000 mg | Freq: Two times a day (BID) | ORAL | Status: DC
  Administered 2020-03-15 – 2020-03-17 (×4): 100 mg
  Filled 2020-03-15 (×4): qty 10

## 2020-03-15 MED ORDER — ONDANSETRON HCL 4 MG PO TABS: 4.0000 mg | ORAL_TABLET | Freq: Four times a day (QID) | ORAL | Status: DC | PRN

## 2020-03-15 MED ORDER — POTASSIUM CHLORIDE 10 MEQ/100ML IV SOLN
10.0000 meq | INTRAVENOUS | Status: AC
  Administered 2020-03-15 (×3): 10 meq via INTRAVENOUS
  Filled 2020-03-15 (×6): qty 100

## 2020-03-15 MED ORDER — ACETAMINOPHEN 160 MG/5ML PO SOLN
650.0000 mg | ORAL | Status: DC
  Administered 2020-03-15 – 2020-03-17 (×7): 650 mg
  Filled 2020-03-15 (×7): qty 20.3

## 2020-03-15 MED ORDER — ASPIRIN 81 MG PO CHEW
81.0000 mg | CHEWABLE_TABLET | Freq: Every day | ORAL | Status: DC
  Administered 2020-03-15 – 2020-03-17 (×3): 81 mg
  Filled 2020-03-15 (×3): qty 1

## 2020-03-15 MED ORDER — MIDAZOLAM HCL 2 MG/2ML IJ SOLN: 1.0000 mg | INTRAMUSCULAR | Status: DC | PRN

## 2020-03-15 MED ORDER — LACTATED RINGERS IV BOLUS
1000.0000 mL | Freq: Once | INTRAVENOUS | Status: AC
  Administered 2020-03-15: 1000 mL via INTRAVENOUS

## 2020-03-15 MED ORDER — FENTANYL CITRATE (PF) 100 MCG/2ML IJ SOLN: 25.0000 ug | INTRAMUSCULAR | Status: DC | PRN

## 2020-03-15 MED ORDER — ROCURONIUM BROMIDE 100 MG/10ML IV SOLN
INTRAVENOUS | Status: DC | PRN
  Administered 2020-03-15: 60 mg via INTRAVENOUS

## 2020-03-15 MED ORDER — IOHEXOL 300 MG/ML  SOLN: 50.0000 mL | Freq: Once | INTRAMUSCULAR | Status: DC | PRN

## 2020-03-15 MED ORDER — GABAPENTIN 250 MG/5ML PO SOLN
300.0000 mg | Freq: Two times a day (BID) | ORAL | Status: DC
  Administered 2020-03-16 – 2020-03-17 (×3): 300 mg
  Filled 2020-03-15 (×5): qty 6

## 2020-03-15 MED ORDER — POTASSIUM CHLORIDE 20 MEQ/15ML (10%) PO SOLN
40.0000 meq | Freq: Once | ORAL | Status: AC
  Administered 2020-03-15: 40 meq
  Filled 2020-03-15: qty 30

## 2020-03-15 MED ORDER — EPINEPHRINE 1 MG/10ML IJ SOSY
PREFILLED_SYRINGE | INTRAMUSCULAR | Status: DC | PRN
  Administered 2020-03-15: .1 mg via INTRAVENOUS

## 2020-03-15 MED ORDER — NALOXONE HCL 0.4 MG/ML IJ SOLN: 0.4000 mg | INTRAMUSCULAR | Status: DC | PRN

## 2020-03-15 MED ORDER — GLYCOPYRROLATE 0.2 MG/ML IJ SOLN
INTRAMUSCULAR | Status: DC | PRN
  Administered 2020-03-15: .2 mg via INTRAVENOUS

## 2020-03-15 MED ORDER — CHLORHEXIDINE GLUCONATE 0.12 % MT SOLN
15.0000 mL | Freq: Two times a day (BID) | OROMUCOSAL | Status: DC
  Administered 2020-03-15 (×2): 15 mL via OROMUCOSAL
  Filled 2020-03-15 (×2): qty 15

## 2020-03-15 MED ORDER — POLYETHYLENE GLYCOL 3350 17 G PO PACK
17.0000 g | PACK | Freq: Every day | ORAL | Status: DC
  Administered 2020-03-16 – 2020-03-17 (×2): 17 g
  Filled 2020-03-15 (×2): qty 1

## 2020-03-15 MED ORDER — FENTANYL CITRATE (PF) 100 MCG/2ML IJ SOLN
25.0000 ug | INTRAMUSCULAR | Status: DC | PRN
  Administered 2020-03-15: 50 ug via INTRAVENOUS

## 2020-03-15 MED ORDER — POTASSIUM CHLORIDE 10 MEQ/100ML IV SOLN
10.0000 meq | INTRAVENOUS | Status: AC
  Administered 2020-03-15 (×3): 10 meq via INTRAVENOUS
  Filled 2020-03-15 (×3): qty 100

## 2020-03-15 MED ORDER — ALLOPURINOL 100 MG PO TABS
100.0000 mg | ORAL_TABLET | Freq: Two times a day (BID) | ORAL | Status: DC
  Administered 2020-03-15 – 2020-03-17 (×4): 100 mg
  Filled 2020-03-15 (×3): qty 1

## 2020-03-15 MED ORDER — INSULIN GLARGINE 100 UNIT/ML ~~LOC~~ SOLN
20.0000 [IU] | Freq: Every day | SUBCUTANEOUS | Status: DC
  Filled 2020-03-15: qty 0.2

## 2020-03-15 MED ORDER — LIDOCAINE HCL 1 % IJ SOLN
INTRAMUSCULAR | Status: AC
  Filled 2020-03-15: qty 20

## 2020-03-15 MED ORDER — NOREPINEPHRINE 4 MG/250ML-% IV SOLN
2.0000 ug/min | INTRAVENOUS | Status: DC
  Administered 2020-03-15: 4 ug/min via INTRAVENOUS
  Administered 2020-03-15: 7 ug/min via INTRAVENOUS
  Filled 2020-03-15 (×2): qty 250

## 2020-03-15 MED ORDER — ACETAMINOPHEN 650 MG RE SUPP
650.0000 mg | RECTAL | Status: DC
  Filled 2020-03-15: qty 1

## 2020-03-15 MED ORDER — NALOXONE HCL 0.4 MG/ML IJ SOLN
INTRAMUSCULAR | Status: AC
  Filled 2020-03-15: qty 1

## 2020-03-15 MED ORDER — SUCCINYLCHOLINE CHLORIDE 20 MG/ML IJ SOLN
INTRAMUSCULAR | Status: DC | PRN
  Administered 2020-03-15: 160 mg via INTRAVENOUS

## 2020-03-15 MED ORDER — HEPARIN (PORCINE) 25000 UT/250ML-% IV SOLN
1400.0000 [IU]/h | INTRAVENOUS | Status: DC
  Administered 2020-03-15: 1050 [IU]/h via INTRAVENOUS
  Administered 2020-03-16: 1400 [IU]/h via INTRAVENOUS
  Filled 2020-03-15 (×2): qty 250

## 2020-03-15 MED ORDER — PROPOFOL 10 MG/ML IV BOLUS
INTRAVENOUS | Status: DC | PRN
  Administered 2020-03-15: 120 mg via INTRAVENOUS

## 2020-03-15 MED ORDER — SODIUM CHLORIDE 0.9 % IV SOLN
2.0000 g | INTRAVENOUS | Status: DC
  Administered 2020-03-15: 2 g via INTRAVENOUS
  Filled 2020-03-15: qty 20

## 2020-03-15 MED ORDER — AMIODARONE HCL IN DEXTROSE 360-4.14 MG/200ML-% IV SOLN
30.0000 mg/h | INTRAVENOUS | Status: DC
  Administered 2020-03-15 – 2020-03-16 (×3): 30 mg/h via INTRAVENOUS
  Filled 2020-03-15 (×3): qty 200

## 2020-03-15 MED ORDER — KETAMINE HCL 10 MG/ML IJ SOLN
INTRAMUSCULAR | Status: DC | PRN
  Administered 2020-03-15: 10 mg via INTRAVENOUS

## 2020-03-15 MED ORDER — AMIODARONE HCL IN DEXTROSE 360-4.14 MG/200ML-% IV SOLN
INTRAVENOUS | Status: AC
  Administered 2020-03-15: 60 mg/h via INTRAVENOUS
  Filled 2020-03-15: qty 200

## 2020-03-15 MED ORDER — PANTOPRAZOLE SODIUM 40 MG IV SOLR
40.0000 mg | INTRAVENOUS | Status: DC
  Administered 2020-03-16 – 2020-03-17 (×2): 40 mg via INTRAVENOUS
  Filled 2020-03-15 (×3): qty 40

## 2020-03-15 MED ORDER — LACTATED RINGERS IV SOLN: INTRAVENOUS | Status: DC | PRN

## 2020-03-15 MED ORDER — PHENYLEPHRINE HCL (PRESSORS) 10 MG/ML IV SOLN
INTRAVENOUS | Status: DC | PRN
  Administered 2020-03-15: 80 ug via INTRAVENOUS

## 2020-03-15 MED ORDER — QUETIAPINE FUMARATE 50 MG PO TABS
50.0000 mg | ORAL_TABLET | Freq: Every day | ORAL | Status: DC
  Administered 2020-03-16: 50 mg
  Filled 2020-03-15: qty 1

## 2020-03-15 MED ORDER — AMIODARONE HCL IN DEXTROSE 360-4.14 MG/200ML-% IV SOLN: 60.0000 mg/h | INTRAVENOUS | Status: AC

## 2020-03-15 MED ORDER — HEPARIN BOLUS VIA INFUSION
4000.0000 [IU] | Freq: Once | INTRAVENOUS | Status: AC
  Administered 2020-03-15: 4000 [IU] via INTRAVENOUS
  Filled 2020-03-15: qty 4000

## 2020-03-15 MED ORDER — KETAMINE HCL 50 MG/5ML IJ SOSY
PREFILLED_SYRINGE | INTRAMUSCULAR | Status: AC
  Filled 2020-03-15: qty 5

## 2020-03-15 MED ORDER — INSULIN ASPART 100 UNIT/ML ~~LOC~~ SOLN
0.0000 [IU] | SUBCUTANEOUS | Status: DC
  Administered 2020-03-15: 5 [IU] via SUBCUTANEOUS

## 2020-03-15 MED ORDER — SODIUM CHLORIDE 0.9 % IV SOLN
250.0000 mL | INTRAVENOUS | Status: DC
  Administered 2020-03-15: 250 mL via INTRAVENOUS

## 2020-03-15 MED ORDER — NOREPINEPHRINE 4 MG/250ML-% IV SOLN: 0.0000 ug/min | INTRAVENOUS | Status: DC

## 2020-03-15 MED ORDER — PRAVASTATIN SODIUM 40 MG PO TABS
40.0000 mg | ORAL_TABLET | Freq: Every day | ORAL | Status: DC
  Administered 2020-03-15 – 2020-03-16 (×2): 40 mg
  Filled 2020-03-15 (×2): qty 1

## 2020-03-15 MED ORDER — DOCUSATE SODIUM 50 MG/5ML PO LIQD: 100.0000 mg | Freq: Two times a day (BID) | ORAL | Status: DC

## 2020-03-15 MED ORDER — EPHEDRINE SULFATE 50 MG/ML IJ SOLN
INTRAMUSCULAR | Status: DC | PRN
  Administered 2020-03-15: 10 mg via INTRAVENOUS

## 2020-03-15 MED ORDER — INSULIN ASPART 100 UNIT/ML ~~LOC~~ SOLN
0.0000 [IU] | Freq: Three times a day (TID) | SUBCUTANEOUS | Status: DC
  Administered 2020-03-15 (×2): 5 [IU] via SUBCUTANEOUS

## 2020-03-15 MED ORDER — ACETAMINOPHEN 325 MG PO TABS
650.0000 mg | ORAL_TABLET | ORAL | Status: DC
  Administered 2020-03-16 – 2020-03-17 (×2): 650 mg
  Filled 2020-03-15 (×2): qty 2

## 2020-03-15 MED ORDER — INSULIN ASPART 100 UNIT/ML ~~LOC~~ SOLN
0.0000 [IU] | SUBCUTANEOUS | Status: DC
  Administered 2020-03-16: 4 [IU] via SUBCUTANEOUS
  Administered 2020-03-16: 11 [IU] via SUBCUTANEOUS
  Administered 2020-03-16: 3 [IU] via SUBCUTANEOUS
  Administered 2020-03-16: 4 [IU] via SUBCUTANEOUS
  Administered 2020-03-16: 11 [IU] via SUBCUTANEOUS
  Administered 2020-03-16: 15 [IU] via SUBCUTANEOUS
  Administered 2020-03-16 – 2020-03-17 (×4): 3 [IU] via SUBCUTANEOUS
  Administered 2020-03-18: 4 [IU] via SUBCUTANEOUS
  Administered 2020-03-19: 3 [IU] via SUBCUTANEOUS
  Administered 2020-03-19 (×2): 4 [IU] via SUBCUTANEOUS
  Administered 2020-03-19 (×2): 3 [IU] via SUBCUTANEOUS
  Administered 2020-03-20: 4 [IU] via SUBCUTANEOUS
  Administered 2020-03-20: 7 [IU] via SUBCUTANEOUS
  Administered 2020-03-20: 4 [IU] via SUBCUTANEOUS
  Administered 2020-03-20: 3 [IU] via SUBCUTANEOUS
  Administered 2020-03-20: 7 [IU] via SUBCUTANEOUS
  Administered 2020-03-21 (×2): 4 [IU] via SUBCUTANEOUS
  Administered 2020-03-21: 7 [IU] via SUBCUTANEOUS
  Administered 2020-03-21 (×2): 3 [IU] via SUBCUTANEOUS
  Administered 2020-03-21 – 2020-03-22 (×6): 4 [IU] via SUBCUTANEOUS
  Administered 2020-03-22 (×2): 3 [IU] via SUBCUTANEOUS
  Administered 2020-03-23: 4 [IU] via SUBCUTANEOUS
  Administered 2020-03-23: 3 [IU] via SUBCUTANEOUS
  Administered 2020-03-23 (×2): 4 [IU] via SUBCUTANEOUS
  Administered 2020-03-23: 3 [IU] via SUBCUTANEOUS
  Administered 2020-03-24 (×3): 4 [IU] via SUBCUTANEOUS
  Administered 2020-03-24: 3 [IU] via SUBCUTANEOUS
  Administered 2020-03-24: 4 [IU] via SUBCUTANEOUS
  Administered 2020-03-24 – 2020-03-25 (×4): 3 [IU] via SUBCUTANEOUS
  Administered 2020-03-25: 7 [IU] via SUBCUTANEOUS
  Administered 2020-03-25 – 2020-03-26 (×5): 4 [IU] via SUBCUTANEOUS
  Administered 2020-03-26: 3 [IU] via SUBCUTANEOUS
  Administered 2020-03-27 (×3): 4 [IU] via SUBCUTANEOUS
  Administered 2020-03-27 – 2020-03-28 (×2): 3 [IU] via SUBCUTANEOUS
  Administered 2020-03-28 (×2): 4 [IU] via SUBCUTANEOUS
  Administered 2020-03-28 – 2020-03-29 (×2): 3 [IU] via SUBCUTANEOUS
  Administered 2020-03-29: 4 [IU] via SUBCUTANEOUS
  Administered 2020-03-29: 7 [IU] via SUBCUTANEOUS
  Administered 2020-03-29: 3 [IU] via SUBCUTANEOUS
  Administered 2020-03-29: 7 [IU] via SUBCUTANEOUS
  Administered 2020-03-29 – 2020-03-30 (×2): 4 [IU] via SUBCUTANEOUS
  Administered 2020-03-30: 3 [IU] via SUBCUTANEOUS
  Administered 2020-03-30 (×4): 4 [IU] via SUBCUTANEOUS
  Administered 2020-03-31: 7 [IU] via SUBCUTANEOUS
  Administered 2020-03-31 – 2020-04-01 (×2): 4 [IU] via SUBCUTANEOUS
  Administered 2020-04-01 (×2): 3 [IU] via SUBCUTANEOUS
  Administered 2020-04-02 (×3): 4 [IU] via SUBCUTANEOUS
  Administered 2020-04-02: 3 [IU] via SUBCUTANEOUS
  Administered 2020-04-03 (×4): 4 [IU] via SUBCUTANEOUS
  Administered 2020-04-04: 3 [IU] via SUBCUTANEOUS
  Administered 2020-04-04: 4 [IU] via SUBCUTANEOUS
  Administered 2020-04-04: 3 [IU] via SUBCUTANEOUS

## 2020-03-15 MED ORDER — VASOPRESSIN 20 UNITS/100 ML INFUSION FOR SHOCK
0.0000 [IU]/min | INTRAVENOUS | Status: DC
  Filled 2020-03-15: qty 100

## 2020-03-15 MED ORDER — CHLORHEXIDINE GLUCONATE CLOTH 2 % EX PADS
6.0000 | MEDICATED_PAD | Freq: Every day | CUTANEOUS | Status: DC
  Administered 2020-03-15 – 2020-03-19 (×4): 6 via TOPICAL

## 2020-03-15 MED ORDER — ONDANSETRON HCL 4 MG/2ML IJ SOLN: 4.0000 mg | Freq: Four times a day (QID) | INTRAMUSCULAR | Status: DC | PRN

## 2020-03-15 MED ORDER — POLYETHYLENE GLYCOL 3350 17 G PO PACK: 17.0000 g | PACK | Freq: Every day | ORAL | Status: DC

## 2020-03-15 MED ORDER — LIDOCAINE HCL (CARDIAC) PF 100 MG/5ML IV SOSY
PREFILLED_SYRINGE | INTRAVENOUS | Status: DC | PRN
  Administered 2020-03-15: 60 mg via INTRAVENOUS

## 2020-03-15 NOTE — Code Documentation (Signed)
  Patient Name: LORENSO [name]   MRN: [id number]   Date of Birth/ Sex: 09-19-1940 , male      Admission Date: 03/14/2020  Attending Provider: Alberteen Sam, *  Primary Diagnosis: Sepsis due to undetermined organism [facility])   Indication: Pt was in his usual state of health until this PM, when he was noted to be in PEA arrest. Code blue was subsequently called. At the time of arrival on scene, ACLS protocol was underway.   Technical Description:  - CPR performance duration:  5 minutes  - Was defibrillation or cardioversion used? No   - Was external pacer placed? Yes  - Was patient intubated pre/post CPR? Yes   Medications Administered: Y = Yes; Blank = No Amiodarone  Y  Atropine    Calcium    Epinephrine  Y  Lidocaine    Magnesium    Norepinephrine    Phenylephrine    Sodium bicarbonate    Vasopressin     Post CPR evaluation:  - Final Status - Was patient successfully resuscitated ? Yes - What is current rhythm? NSR  - What is current hemodynamic status? On pressor support  Miscellaneous Information:  - Labs sent, including: CBC, CMP, Lactate, Troponins, ABG  - Primary team notified?  Yes  - Family Notified? Yes  - Additional notes/ transfer status: Patient noted to be in PEA following intubation for his perc-chole. CPR initiated and Code Blue called. Given Epi with ROSC. However, noted to be in V.fib for which given Amiodarone. Rhythm returned to NSR. However, was hypotensive (confirmed with Aline reading) for which started on Levo and Neo. PCCM consulted. Patient transferred to 53M for further management.      Eliezer Bottom, MD  [facility] PGY-2 03/15/2020, 5:30 PM

## 2020-03-15 NOTE — Anesthesia Preprocedure Evaluation (Addendum)
Anesthesia Evaluation  Patient identified by MRN, date of birth, ID band Patient confused    Reviewed: Allergy & Precautions, NPO status , Patient's Chart, lab work & pertinent test results  Airway Mallampati: III  TM Distance: <3 FB Neck ROM: Full    Dental  (+) Edentulous Upper, Poor Dentition   Pulmonary sleep apnea and Continuous Positive Airway Pressure Ventilation , former smoker,    + rhonchi  + decreased breath sounds      Cardiovascular hypertension, + CAD, + Past MI, + Peripheral Vascular Disease and +CHF  Normal cardiovascular exam+ dysrhythmias Atrial Fibrillation  Rhythm:Regular Rate:Normal  ECHO 2018: - Left ventricle: The cavity size was normal. Wall thickness was  normal. Systolic function was normal. The estimated ejection  fraction was in the range of 55% to 65%. Wall motion was normal;  there were no regional wall motion abnormalities.  - Mitral valve: There was mild regurgitation.  - Right ventricle: Systolic function was mildly reduced.  - Right atrium: The atrium was mildly dilated.  - Pulmonary arteries: Systolic pressure was moderately increased.  PA peak pressure: 44 mm Hg (S).    Neuro/Psych PSYCHIATRIC DISORDERS Dementia CVA    GI/Hepatic negative GI ROS, Neg liver ROS,   Endo/Other  diabetesMorbid obesity  Renal/GU Renal InsufficiencyRenal disease  negative genitourinary   Musculoskeletal negative musculoskeletal ROS (+)   Abdominal (+) + obese,   Peds  Hematology  (+) anemia ,   Anesthesia Other Findings   Reproductive/Obstetrics negative OB ROS                         Anesthesia Physical Anesthesia Plan  ASA: III  Anesthesia Plan: General   Post-op Pain Management:    Induction:   PONV Risk Score and Plan: 2 and Ondansetron and Dexamethasone  Airway Management Planned: Oral ETT  Additional Equipment:   Intra-op Plan:   Post-operative  Plan: Extubation in OR  Informed Consent: I have reviewed the patients History and Physical, chart, labs and discussed the procedure including the risks, benefits and alternatives for the proposed anesthesia with the patient or authorized representative who has indicated his/her understanding and acceptance.     Consent reviewed with POA  Plan Discussed with: CRNA and Anesthesiologist  Anesthesia Plan Comments:        Anesthesia Quick Evaluation

## 2020-03-15 NOTE — Progress Notes (Signed)
[facility] PROGRESS NOTE    Isaiah ALVILLAR  BJY:[id number] DOB: 04-17-1941 DOA: 03/14/2020 PCP: [place], [name], MD      Brief Narrative:  Isaiah [name] is a 79 y.o. M with CAD s/p PCI remote (2018), DM, Afib on warfarin, hx GIB, dCHF, and occult GIB who presented with malaise, confusion and abdominal pain for 2 days, worsening.  Patient was in his usual state of health until 2 days prior to admission, noted to be confused in the evening.  Day before admission, had malaise, didn't eat, slept all day.  In the evening, seemed ill and mumbling and confused, then Monday, delirious and brought to ER.  In the ER, WBC 21K, lactate 2.2, temp 100.47F, encephalopathic.  Cr 3.45 from baseline 1.6.  A1c 12%.  CT abdomen and pelvis showed findings of cholecystitis, consistent with exam.  Patient evaluated by General Surgery who recommended percutaneous drainage.        Assessment & Plan:  Severe sepsis due to acute cholecystitis -Continue antibiotics, no risk factors for PsA, will narrow to CTX/Flagyl -Follow culture data -Consult IR for per tube -Will expect interval cholecystectomy at 6-8 weeks    ADDENDUM: Brief cardiac arrest  Patient in IR for procedure, elected to perform under general anesthesia due to severe OSA, renal failure.  After intubation and before Perc tube placement, patient became hypotensive and bradycardic, lost pulses.  CPR initiated, completed 1 round and 1 round of epi and pulses resumed. Levophed started, IV fluids. -Transfer to ICU -Consult CCM, appreciate cares  Acute kidney injury Improving with fluids.  Likely hemodynamic and sepsis-mediated. -Continue gentle fluids -Hold diuretics   Diabetes with polyneuropathy Glucoses not controlled here or at baseline. A1c 12% at baseline. -Continue SS corrections -Continue gabapentin -Hold glipizide  Atrial fibrillation, chronic CHA2DS2-Vasc 5, will defer bridging. -Hold warfarin for perc  tube -Reverse INR  Chronic diastolic CHF Coronary disease secondary prevention No gross fluid overload -Continue Imdur, hydralazine, pravastatin -Hold Lasix, Zaroxolyn given AKI  Pressure injury, left buttock, stage II, POA Pressure injury, right buttock stage II POA -Consult WOC  Gout -Continue allopurinol  Other medications -Continue Seroquel  Hyponatremia Mild, asymptomatic.  Hypokalemia Mag normal to high  -Supplement K -Trend K           Disposition: Status is: Inpatient  Remains inpatient appropriate because:Hemodynamically unstable   Dispo: The patient is from: Home              Anticipated d/c is to: SNF              Anticipated d/c date is: > 3 days              Patient currently is not medically stable to d/c.              MDM: The below labs and imaging reports were reviewed and summarized above.  Medication management as above.    DVT prophylaxis: SCDs Start: 03/14/20 1422  Code Status: FULL Family Communication: Daughter by phone    Consultants:   Gen Surg  CCM    Antimicrobials:   Vancomycin, flagyl 11/1 >>  Cefepime x1 11/1                      Ceftriaxone 11/2 >>   Culture data:   Blood culture 11/1 ngtd   Urine culture 11/1 NG          Subjective: Patient sleepy this morning, improved with  fluids and Narcan.  Interactive, oriented only to self.  Reported abdominal pain with exam, no complaints at rest.  No respiratory symptoms, no urinary symptoms.  Objective: Vitals:   03/15/20 0343 03/15/20 0400 03/15/20 0500 03/15/20 0728  BP: (!) 126/55 (!) 126/51 (!) 122/57   Pulse:      Resp: 20     Temp:    98.7 F (37.1 C)  TempSrc:    Oral  SpO2: 99%  98%   Weight:      Height:        Intake/Output Summary (Last 24 hours) at 03/15/2020 0824 Last data filed at 03/15/2020 0459 Gross per 24 hour  Intake 785.35 ml  Output 450 ml  Net 335.35 ml   Filed Weights   03/14/20 0810 03/14/20 2205   Weight: 95.3 kg 94.2 kg    Examination: General appearance: Obese adult male, awake but sleepy, in no obvious distress.  Apneic when dozing off. HEENT: Anicteric, conjunctiva pink, lids and lashes normal. No nasal deformity, discharge, epistaxis.  Lips dry, partially edentulous, OP dry, no oral lesions.  Hearing diminished Skin: Warm and dry.  No jaundice.  No suspicious rashes or lesions. Cardiac: RRR, nl S1-S2, no murmurs appreciated.  Capillary refill is brisk.  JVP not visible.  No LE edema.  Radial pulses 2+ and symmetric. Respiratory: Normal respiratory rate and rhythm.  CTAB without rales or wheezes. Abdomen: Abdomen soft.  Moderate RUQ TTP with guarding. No ascites, distension, hepatosplenomegaly.   MSK: No deformities or effusions. Neuro: Awake  But sleepy.  EOMI, moves all extremities weakly but symmetrically. Speech slow.  Face symmetric.  Tongue midline.  Exam limited by sleepiness.    Psych: Sensorium intact and responding to questions, attention diminished  Judgment and insight appear impaired.    Data Reviewed: I have personally reviewed following labs and imaging studies:  CBC: Recent Labs  Lab 03/14/20 0743 03/15/20 0027  WBC 21.2* 17.1*  NEUTROABS 18.6*  --   HGB 13.9 12.7*  HCT 43.4 39.3  MCV 105.3* 104.0*  PLT 141* 127*   Basic Metabolic Panel: Recent Labs  Lab 03/14/20 0743 03/15/20 0027  NA 130* 131*  K 3.5 3.0*  CL 78* 86*  CO2 35* 33*  GLUCOSE 306* 207*  BUN 73* 83*  CREATININE 3.45* 2.75*  CALCIUM 10.0 9.2  MG  --  2.6*   GFR: Estimated Creatinine Clearance: 24.7 mL/min (A) (by C-G formula based on SCr of 2.75 mg/dL (H)). Liver Function Tests: Recent Labs  Lab 03/14/20 0743  AST 24  ALT 13  ALKPHOS 82  BILITOT 1.1  PROT 7.8  ALBUMIN 2.8*   Recent Labs  Lab 03/14/20 0818  LIPASE 35   Recent Labs  Lab 03/14/20 0744  AMMONIA 17   Coagulation Profile: Recent Labs  Lab 03/14/20 0911 03/15/20 0643  INR 2.7* 1.5*   Cardiac  Enzymes: No results for input(s): CKTOTAL, CKMB, CKMBINDEX, TROPONINI in the last 168 hours. BNP (last 3 results) No results for input(s): PROBNP in the last 8760 hours. HbA1C: Recent Labs    03/14/20 2125  HGBA1C 12.1*   CBG: Recent Labs  Lab 03/14/20 1657 03/14/20 2255 03/15/20 0408 03/15/20 0729  GLUCAP 233* 192* 233* 263*   Lipid Profile: No results for input(s): CHOL, HDL, LDLCALC, TRIG, CHOLHDL, LDLDIRECT in the last 72 hours. Thyroid Function Tests: No results for input(s): TSH, T4TOTAL, FREET4, T3FREE, THYROIDAB in the last 72 hours. Anemia Panel: No results for input(s): VITAMINB12, FOLATE, FERRITIN, TIBC,  IRON, RETICCTPCT in the last 72 hours. Urine analysis:    Component Value Date/Time   COLORURINE YELLOW 03/14/2020 0840   APPEARANCEUR HAZY (A) 03/14/2020 0840   LABSPEC >1.030 (H) 03/14/2020 0840   PHURINE 5.0 03/14/2020 0840   GLUCOSEU NEGATIVE 03/14/2020 0840   HGBUR TRACE (A) 03/14/2020 0840   BILIRUBINUR NEGATIVE 03/14/2020 0840   KETONESUR NEGATIVE 03/14/2020 0840   PROTEINUR 100 (A) 03/14/2020 0840   NITRITE NEGATIVE 03/14/2020 0840   LEUKOCYTESUR NEGATIVE 03/14/2020 0840   Sepsis Labs: @LABRCNTIP (procalcitonin:4,lacticacidven:4)  ) Recent Results (from the past 240 hour(s))  Blood culture (routine x 2)     Status: None (Preliminary result)   Collection Time: 03/14/20  7:44 AM   Specimen: BLOOD LEFT ARM  Result Value Ref Range Status   Specimen Description BLOOD LEFT ARM  Final   Special Requests   Final    BOTTLES DRAWN AEROBIC AND ANAEROBIC Blood Culture adequate volume   Culture   Final    NO GROWTH < 24 HOURS Performed at [facility], [address] [place], [place] [address]    Report Status PENDING  Incomplete  Respiratory Panel by RT PCR (Flu A&B, Covid) - Nasopharyngeal Swab     Status: None   Collection Time: 03/14/20  8:03 AM   Specimen: Nasopharyngeal Swab  Result Value Ref Range Status   SARS Coronavirus 2 by RT PCR  NEGATIVE NEGATIVE Final    Comment: (NOTE) SARS-CoV-2 target nucleic acids are NOT DETECTED.  The SARS-CoV-2 RNA is generally detectable in upper respiratoy specimens during the acute phase of infection. The lowest concentration of SARS-CoV-2 viral copies this assay can detect is 131 copies/mL. A negative result does not preclude SARS-Cov-2 infection and should not be used as the sole basis for treatment or other patient management decisions. A negative result may occur with  improper specimen collection/handling, submission of specimen other than nasopharyngeal swab, presence of viral mutation(s) within the areas targeted by this assay, and inadequate number of viral copies (<131 copies/mL). A negative result must be combined with clinical observations, patient history, and epidemiological information. The expected result is Negative.  Fact Sheet for Patients:  https://www.moore.com/https://www.fda.gov/media/142436/download  Fact Sheet for Healthcare Providers:  https://www.young.biz/https://www.fda.gov/media/142435/download  This test is no t yet approved or cleared by the [place] FDA and  has been authorized for detection and/or diagnosis of SARS-CoV-2 by FDA under an Emergency Use Authorization (EUA). This EUA will remain  in effect (meaning this test can be used) for the duration of the COVID-19 declaration under Section 564(b)(1) of the Act, 21 U.S.C. section 360bbb-3(b)(1), unless the authorization is terminated or revoked sooner.     Influenza A by PCR NEGATIVE NEGATIVE Final   Influenza B by PCR NEGATIVE NEGATIVE Final    Comment: (NOTE) The Xpert Xpress SARS-CoV-2/FLU/RSV assay is intended as an aid in  the diagnosis of influenza from Nasopharyngeal swab specimens and  should not be used as a sole basis for treatment. Nasal washings and  aspirates are unacceptable for Xpert Xpress SARS-CoV-2/FLU/RSV  testing.  Fact Sheet for Patients: https://www.moore.com/https://www.fda.gov/media/142436/download  Fact Sheet for  Healthcare Providers: https://www.young.biz/https://www.fda.gov/media/142435/download  This test is not yet approved or cleared by the [place] FDA and  has been authorized for detection and/or diagnosis of SARS-CoV-2 by  FDA under an Emergency Use Authorization (EUA). This EUA will remain  in effect (meaning this test can be used) for the duration of the  Covid-19 declaration under Section 564(b)(1) of the Act, 21  U.S.C. section 360bbb-3(b)(1), unless the authorization is  terminated or revoked. Performed at [facility], [address] [place], [place] [address]   Blood culture (routine x 2)     Status: None (Preliminary result)   Collection Time: 03/14/20  8:18 AM   Specimen: BLOOD RIGHT HAND  Result Value Ref Range Status   Specimen Description BLOOD RIGHT HAND  Final   Special Requests   Final    BOTTLES DRAWN AEROBIC ONLY Blood Culture results may not be optimal due to an inadequate volume of blood received in culture bottles   Culture   Final    NO GROWTH < 24 HOURS Performed at [facility], [address] [place], [place] [address]    Report Status PENDING  Incomplete  Urine culture     Status: None   Collection Time: 03/14/20  8:41 AM   Specimen: Urine, Random  Result Value Ref Range Status   Specimen Description URINE, RANDOM  Final   Special Requests NONE  Final   Culture   Final    NO GROWTH Performed at [facility], [address], [place], [place] [address]    Report Status 03/15/2020 FINAL  Final  MRSA PCR Screening     Status: None   Collection Time: 03/14/20 10:49 PM   Specimen: Nasal Mucosa; Nasopharyngeal  Result Value Ref Range Status   MRSA by PCR NEGATIVE NEGATIVE Final    Comment:        The GeneXpert MRSA Assay (FDA approved for NASAL specimens only), is one component of a comprehensive MRSA colonization surveillance program. It is not intended to diagnose MRSA infection nor to guide or monitor treatment for MRSA infections. Performed  at [facility], [address] [place], [place] [address]          Radiology Studies: CT ABDOMEN PELVIS WO CONTRAST  Result Date: 03/14/2020 CLINICAL DATA:  Right upper quadrant abdominal pain EXAM: CT ABDOMEN AND PELVIS WITHOUT CONTRAST TECHNIQUE: Multidetector CT imaging of the abdomen and pelvis was performed following the standard protocol without IV contrast. COMPARISON:  12/08/2018 FINDINGS: Motion degraded images. Lower chest: Trace bilateral pleural effusions. Associated lower lobe opacities, likely atelectasis. Hepatobiliary: Cirrhosis.  No focal hepatic lesion is seen. Distended gallbladder with layering gallstones (series 3/image 36). Mild pericholecystic stranding (series 3/image 40), raising concern for acute cholecystitis. No intrahepatic or extrahepatic ductal dilatation. Pancreas: Within normal limits. Spleen: Within normal limits. Adrenals/Urinary Tract: Adrenal glands are within normal limits. 2.3 cm medial right lower pole renal cyst (series 3/image 45). Bilateral kidneys are otherwise unremarkable, noting renal vascular calcifications. No hydronephrosis. Mildly thick-walled bladder, although underdistended. Stomach/Bowel: Stomach is within normal limits. No evidence of bowel obstruction. Appendix is not discretely visualized. Scattered sigmoid diverticulosis, without evidence of diverticulitis. Vascular/Lymphatic: No evidence of abdominal aortic aneurysm. Extensive atherosclerotic calcifications of the abdominal aorta and branch vessels. No suspicious abdominopelvic lymphadenopathy. Reproductive: Prostate is unremarkable. Other: No abdominopelvic ascites. Musculoskeletal: Degenerative changes of the visualized thoracolumbar spine. Posterior thoracic fixation ending at L1, incompletely visualized. Median sternotomy. IMPRESSION: Motion degraded images. Distended gallbladder with layering gallstones and mild pericholecystic stranding, raising concern for acute cholecystitis.  Additional ancillary findings as above. Electronically Signed   By: Charline Bills M.D.   On: 03/14/2020 09:50   CT Head Wo Contrast  Result Date: 03/14/2020 CLINICAL DATA:  Altered mental status/delirium EXAM: CT HEAD WITHOUT CONTRAST TECHNIQUE: Contiguous axial images were obtained from the base of the skull  through the vertex without intravenous contrast. COMPARISON:  08/11/2016 FINDINGS: Brain: No evidence of acute infarction, hemorrhage, hydrocephalus, extra-axial collection or mass lesion/mass effect. Global cortical atrophy. Subcortical white matter and periventricular small vessel ischemic changes. Vascular: Intracranial atherosclerosis. Skull: Normal. Negative for fracture or focal lesion. Sinuses/Orbits: The visualized paranasal sinuses are essentially clear. The mastoid air cells are unopacified. Other: None. IMPRESSION: No evidence of acute intracranial abnormality. Atrophy with small vessel ischemic changes. Electronically Signed   By: Charline Bills M.D.   On: 03/14/2020 09:36   DG Chest Portable 1 View  Result Date: 03/14/2020 CLINICAL DATA:  Shortness of breath and fatigue EXAM: PORTABLE CHEST 1 VIEW COMPARISON:  12/08/2018 FINDINGS: Signs of previous CABG procedure. Aortic atherosclerotic calcifications. Heart size normal. Small pleural effusions are suspected with blunting of the costophrenic angles. Pulmonary vascular congestion. IMPRESSION: Suspect small bilateral pleural effusions and pulmonary vascular congestion. No signs of pneumonia. Electronically Signed   By: Signa Kell M.D.   On: 03/14/2020 07:02        Scheduled Meds: . allopurinol  100 mg Oral BID  . chlorhexidine  15 mL Mouth Rinse BID  . gabapentin  300 mg Oral BID  . hydrALAZINE  100 mg Oral BID  . insulin aspart  0-15 Units Subcutaneous TID WC  . insulin aspart  0-5 Units Subcutaneous QHS  . isosorbide mononitrate  60 mg Oral Daily  . mouth rinse  15 mL Mouth Rinse q12n4p  . pravastatin  40 mg Oral  QHS  . QUEtiapine  50 mg Oral Q supper  . sodium chloride flush  3 mL Intravenous Q12H   Continuous Infusions: . ceFEPime (MAXIPIME) IV    . lactated ringers 100 mL/hr at 03/15/20 0135  . metronidazole 500 mg (03/15/20 0002)     LOS: 1 day    Time spent: 35 minutes    Alberteen Sam, MD [facility] 03/15/2020, 8:24 AM     Please page though AMION or Epic secure chat:  For [employer], Higher education careers adviser

## 2020-03-15 NOTE — Anesthesia Procedure Notes (Signed)
Procedure Name: Intubation Date/Time: 03/15/2020 4:30 PM Performed by: Delfino Friesen T, CRNA Pre-anesthesia Checklist: Patient identified, Emergency Drugs available, Suction available and Patient being monitored Patient Re-evaluated:Patient Re-evaluated prior to induction Oxygen Delivery Method: Circle system utilized Preoxygenation: Pre-oxygenation with 100% oxygen Induction Type: IV induction Ventilation: Mask ventilation without difficulty Laryngoscope Size: McGraph, Glidescope and 3 Grade View: Grade I Tube type: Oral Tube size: 7.5 mm Number of attempts: 1 Airway Equipment and Method: Stylet and Oral airway Placement Confirmation: ETT inserted through vocal cords under direct vision,  positive ETCO2 and breath sounds checked- equal and bilateral Secured at: 22 cm Tube secured with: Tape Dental Injury: Teeth and Oropharynx as per pre-operative assessment

## 2020-03-15 NOTE — Progress Notes (Signed)
Inpatient Diabetes Program Recommendations  AACE/ADA: New Consensus Statement on Inpatient Glycemic Control (2015)  Target Ranges:  Prepandial:   less than 140 mg/dL      Peak postprandial:   less than 180 mg/dL (1-2 hours)      Critically ill patients:  140 - 180 mg/dL   Lab Results  Component Value Date   GLUCAP 263 (H) 03/15/2020   HGBA1C 12.1 (H) 03/14/2020    Review of Glycemic Control Results for [name], [name] (MRN [id number]) as of 03/15/2020 10:14  Ref. Range 03/14/2020 16:57 03/14/2020 22:55 03/15/2020 04:08 03/15/2020 07:29  Glucose-Capillary Latest Ref Range: 70 - 99 mg/dL 017 (H) 510 (H) 258 (H) 263 (H)   Diabetes history: DM2 Outpatient Diabetes medications: Glipizide 5 mg bid, 70/30 50 units bid  Current orders for Inpatient glycemic control: Novolog moderate correction tid + hs 0-5 units  Inpatient Diabetes Program Recommendations:   -Add Lantus 20 units daily (0.2 units/kg x 94.2 kg = 19 units) -May need meal coverage when eating  Thank you, Isaiah Hong E. Jerrik Housholder, Isaiah [name], Isaiah [name], Isaiah [name]  Diabetes Coordinator Inpatient Glycemic Control Team Team Pager [phone] (8am-5pm) 03/15/2020 10:18 AM

## 2020-03-15 NOTE — Progress Notes (Signed)
RN was in patient room doing morning assesment. Patient was on Cpap from previous shift. Cpap was removed, oral care performed.  When asked patient for his name, patient responded with a slurred like speech and will fall right back to sleep. daughter was on the phone with her husband who was in the room, and when she heard him speak, she said that was not his baseline. This nurse did a neuro exam and it seen to be WNL. MD Danford was paged to make aware of the above, he came to beside order RN to give narcan, and RT to check ABGs. Rapid was also made aware and is at bedside as well. Patient is more alert after narcan.

## 2020-03-15 NOTE — Anesthesia Procedure Notes (Signed)
Arterial Line Insertion Start/End11/06/2019 4:38 PM Performed by: Mellody Dance, MD  Patient location: OOR procedure area. radial was placed Catheter size: 20 G Hand hygiene performed  and maximum sterile barriers used   Attempts: 1 Procedure performed without using ultrasound guided technique. Following insertion, Biopatch and dressing applied.

## 2020-03-15 NOTE — Progress Notes (Addendum)
Trop leak noted. Hx CABG, echo 2018 preserved. WMA and global LV hypokinesis on bedside echo. Do stat EKG, full echo, trend trops, and heparin gtt, consider cards consult in AM.  Myrla Halsted MD PCCM  Addendum: EKG reviewed, no STEMI, TWI in inferior leads, ST depression V5, V6 new from admission c/w Type 1/2 NSTEMI.

## 2020-03-15 NOTE — Progress Notes (Signed)
Patient's son-in-law's phone charger returned to him from [employer].

## 2020-03-15 NOTE — Progress Notes (Signed)
Responded to code blue in IR. Patient was already intubated & on anesthesia vent upon arrival. ROSC achieved and patient was placed on Servo-I & transported to 6M12 with CRNA, rapid response RN, & RN from IR without any complications. Report was given to 6M RT.

## 2020-03-15 NOTE — Progress Notes (Signed)
eLink Physician-Brief Progress Note Patient Name: BRICE KOSSMAN DOB: 11-Jun-1940 MRN: [id number]   Date of Service  03/15/2020  HPI/Events of Note  Multiple issues: 1. Hyperglycemia - Blood glucose = 233 --> 3.19 and 2. Urinary retention - Bladder scan with 425 mL residual.   eICU Interventions  Plan: 1. Increase to Q 4 hour resistant Novolog SSI. 2. I/O cath PRN.      Intervention Category Major Interventions: Other:;Hyperglycemia - active titration of insulin therapy  Lenell Antu 03/15/2020, 11:46 PM

## 2020-03-15 NOTE — Progress Notes (Signed)
K+ = 3.0 and Na = 131. Pt NPO for IR cholecystostomy in AM. MD paged.

## 2020-03-15 NOTE — Progress Notes (Signed)
Notified E-;ink of blood glucose 319.

## 2020-03-15 NOTE — Progress Notes (Addendum)
[facility] Progress Note     Subjective: CC-  On CPAP. Lethargic but does report persistent abdominal pain. WBC down 17.1, TMAX 99.1 Going for IR placement of perc chole today.  Objective: Vital signs in last 24 hours: Temp:  [98.7 F (37.1 C)-99.1 F (37.3 C)] 98.7 F (37.1 C) (11/02 0728) Pulse Rate:  [61-83] 83 (11/02 0800) Resp:  [16-22] 20 (11/02 0800) BP: (101-143)/(51-114) 118/64 (11/02 0800) SpO2:  [90 %-100 %] 97 % (11/02 0800) Weight:  [94.2 kg] 94.2 kg (11/01 2205)    Intake/Output from previous day: 11/01 0701 - 11/02 0700 In: 785.4 [I.V.:688.8; IV Piggyback:96.5] Out: 450 [Urine:450] Intake/Output this shift: No intake/output data recorded.  PE: Gen:  Lethargic but arousable, opens eyes and nods appropriately to questions, NAD Card: irregular Pulm:  CTAB, no W/R/R, rate and effort normal on CPAP Abd: Soft, protuberant, hypoactive BS, mild diffuse TTP with significant RUQ and epigastric TTP Skin: warm and dry  Lab Results:  Recent Labs    03/14/20 0743 03/15/20 0027  WBC 21.2* 17.1*  HGB 13.9 12.7*  HCT 43.4 39.3  PLT 141* 127*   BMET Recent Labs    03/14/20 0743 03/15/20 0027  NA 130* 131*  K 3.5 3.0*  CL 78* 86*  CO2 35* 33*  GLUCOSE 306* 207*  BUN 73* 83*  CREATININE 3.45* 2.75*  CALCIUM 10.0 9.2   PT/INR Recent Labs    03/14/20 0911 03/15/20 0643  LABPROT 28.1* 17.4*  INR 2.7* 1.5*   CMP     Component Value Date/Time   NA 131 (L) 03/15/2020 0027   K 3.0 (L) 03/15/2020 0027   CL 86 (L) 03/15/2020 0027   CO2 33 (H) 03/15/2020 0027   GLUCOSE 207 (H) 03/15/2020 0027   BUN 83 (H) 03/15/2020 0027   CREATININE 2.75 (H) 03/15/2020 0027   CREATININE 1.77 (H) 08/28/2016 1113   CALCIUM 9.2 03/15/2020 0027   PROT 7.8 03/14/2020 0743   ALBUMIN 2.8 (L) 03/14/2020 0743   AST 24 03/14/2020 0743   ALT 13 03/14/2020 0743   ALKPHOS 82 03/14/2020 0743   BILITOT 1.1 03/14/2020 0743   GFRNONAA 23 (L) 03/15/2020 0027    GFRAA 46 (L) 12/14/2018 0825   Lipase     Component Value Date/Time   LIPASE 35 03/14/2020 0818       Studies/Results: CT ABDOMEN PELVIS WO CONTRAST  Result Date: 03/14/2020 CLINICAL DATA:  Right upper quadrant abdominal pain EXAM: CT ABDOMEN AND PELVIS WITHOUT CONTRAST TECHNIQUE: Multidetector CT imaging of the abdomen and pelvis was performed following the standard protocol without IV contrast. COMPARISON:  12/08/2018 FINDINGS: Motion degraded images. Lower chest: Trace bilateral pleural effusions. Associated lower lobe opacities, likely atelectasis. Hepatobiliary: Cirrhosis.  No focal hepatic lesion is seen. Distended gallbladder with layering gallstones (series 3/image 36). Mild pericholecystic stranding (series 3/image 40), raising concern for acute cholecystitis. No intrahepatic or extrahepatic ductal dilatation. Pancreas: Within normal limits. Spleen: Within normal limits. Adrenals/Urinary Tract: Adrenal glands are within normal limits. 2.3 cm medial right lower pole renal cyst (series 3/image 45). Bilateral kidneys are otherwise unremarkable, noting renal vascular calcifications. No hydronephrosis. Mildly thick-walled bladder, although underdistended. Stomach/Bowel: Stomach is within normal limits. No evidence of bowel obstruction. Appendix is not discretely visualized. Scattered sigmoid diverticulosis, without evidence of diverticulitis. Vascular/Lymphatic: No evidence of abdominal aortic aneurysm. Extensive atherosclerotic calcifications of the abdominal aorta and branch vessels. No suspicious abdominopelvic lymphadenopathy. Reproductive: Prostate is unremarkable. Other: No abdominopelvic ascites. Musculoskeletal: Degenerative changes of the  visualized thoracolumbar spine. Posterior thoracic fixation ending at L1, incompletely visualized. Median sternotomy. IMPRESSION: Motion degraded images. Distended gallbladder with layering gallstones and mild pericholecystic stranding, raising concern  for acute cholecystitis. Additional ancillary findings as above. Electronically Signed   By: Charline Bills M.D.   On: 03/14/2020 09:50   CT Head Wo Contrast  Result Date: 03/14/2020 CLINICAL DATA:  Altered mental status/delirium EXAM: CT HEAD WITHOUT CONTRAST TECHNIQUE: Contiguous axial images were obtained from the base of the skull through the vertex without intravenous contrast. COMPARISON:  08/11/2016 FINDINGS: Brain: No evidence of acute infarction, hemorrhage, hydrocephalus, extra-axial collection or mass lesion/mass effect. Global cortical atrophy. Subcortical white matter and periventricular small vessel ischemic changes. Vascular: Intracranial atherosclerosis. Skull: Normal. Negative for fracture or focal lesion. Sinuses/Orbits: The visualized paranasal sinuses are essentially clear. The mastoid air cells are unopacified. Other: None. IMPRESSION: No evidence of acute intracranial abnormality. Atrophy with small vessel ischemic changes. Electronically Signed   By: Charline Bills M.D.   On: 03/14/2020 09:36   DG Chest Portable 1 View  Result Date: 03/14/2020 CLINICAL DATA:  Shortness of breath and fatigue EXAM: PORTABLE CHEST 1 VIEW COMPARISON:  12/08/2018 FINDINGS: Signs of previous CABG procedure. Aortic atherosclerotic calcifications. Heart size normal. Small pleural effusions are suspected with blunting of the costophrenic angles. Pulmonary vascular congestion. IMPRESSION: Suspect small bilateral pleural effusions and pulmonary vascular congestion. No signs of pneumonia. Electronically Signed   By: Signa Kell M.D.   On: 03/14/2020 07:02    Anti-infectives: Anti-infectives (From admission, onward)   Start     Dose/Rate Route Frequency Ordered Stop   03/15/20 1000  ceFEPIme (MAXIPIME) 2 g in sodium chloride 0.9 % 100 mL IVPB  Status:  Discontinued        2 g 200 mL/hr over 30 Minutes Intravenous Every 24 hours 03/14/20 1020 03/15/20 0843   03/15/20 0930  cefTRIAXone (ROCEPHIN) 2  g in sodium chloride 0.9 % 100 mL IVPB        2 g 200 mL/hr over 30 Minutes Intravenous Every 24 hours 03/15/20 0844     03/14/20 1600  metroNIDAZOLE (FLAGYL) IVPB 500 mg        500 mg 100 mL/hr over 60 Minutes Intravenous Every 8 hours 03/14/20 1427     03/14/20 0800  ceFEPIme (MAXIPIME) 2 g in sodium chloride 0.9 % 100 mL IVPB        2 g 200 mL/hr over 30 Minutes Intravenous  Once 03/14/20 0752 03/14/20 0845   03/14/20 0800  metroNIDAZOLE (FLAGYL) IVPB 500 mg        500 mg 100 mL/hr over 60 Minutes Intravenous  Once 03/14/20 0752 03/14/20 0935       Assessment/Plan CAD - followed by Dr. Allyson Sabal CHF - ECHO 07/2016 EF 55-65% A Fib on coumadin - INR down to 1.5 Hx DVT DM HTN HLD PVD Hx CVA Obesity BMI 33.89  Acute cholecystitis - to IR for perc cholecystostomy tube placement today. Continue IV antibiotics. Ok for clear liquid diet after procedure from surgical standpoint if patient is awake/alert enough. - ultimately plan for preoperative clearance as an outpatient with likely plans to proceed with interval lap chole in 6-8 weeks if cleared.  ID - maxipime 11/1, flagyl 11/1>>, rocephin 11/2>> (will discuss switching back to maxipime/flagyl vs zosyn with primary) VTE - INR reversed with Vit K FEN - IVF, NPO for procedure Foley - none Follow up - TBD   LOS: 1 day    Isaiah [name] A  Alvia Tory, [facility] 03/15/2020, 9:46 AM Please see Amion for pager number during day hours 7:00am-4:30pm

## 2020-03-15 NOTE — Progress Notes (Signed)
Notified E-link patient Troponin 308.

## 2020-03-15 NOTE — Transfer of Care (Signed)
Immediate Anesthesia Transfer of Care Note  Patient: Isaiah [name]  Procedure(s) Performed: IR WITH ANESTHESIA (N/A )  Patient Location: ICU  Anesthesia Type:General  Level of Consciousness: Patient remains intubated per anesthesia plan  Airway & Oxygen Therapy: Patient remains intubated per anesthesia plan and Patient placed on Ventilator (see vital sign flow sheet for setting)  Post-op Assessment: Report given to RN and Post -op Vital signs reviewed and stable  Post vital signs: Reviewed and stable  Last Vitals:  Vitals Value Taken Time  BP 133/56 03/15/20 1816  Temp    Pulse 116 03/15/20 1820  Resp 20 03/15/20 1820  SpO2 100 % 03/15/20 1820  Vitals shown include unvalidated device data.  Last Pain:  Vitals:   03/15/20 1246  TempSrc: Oral  PainSc:          Complications: No complications documented.

## 2020-03-15 NOTE — Progress Notes (Signed)
Critical ABG values given to P. Hoffman, NP. 

## 2020-03-15 NOTE — Progress Notes (Addendum)
ANTICOAGULATION CONSULT NOTE - Initial Consult  Pharmacy Consult for heparin Indication: chest pain/ACS  No Known Allergies  Patient Measurements: Height: 5\' 9"  (175.3 cm) Weight: 94.2 kg (207 lb 10.8 oz) IBW/kg (Calculated) : 70.7 Heparin Dosing Weight: 90 kg  Vital Signs: Temp: 100.3 F (37.9 C) (11/02 2018) Temp Source: Oral (11/02 2018) BP: 113/55 (11/02 2000) Pulse Rate: 105 (11/02 2000)  Labs: Recent Labs    03/14/20 0743 03/14/20 0743 03/14/20 0911 03/15/20 0027 03/15/20 0027 03/15/20 0643 03/15/20 1650 03/15/20 1705 03/15/20 1757 03/15/20 1802 03/15/20 1916  HGB 13.9   < >  --  12.7*   < >  --  11.8*  --   --  10.9*  --   HCT 43.4   < >  --  39.3  --   --  36.8*  --   --  32.0*  --   PLT 141*  --   --  127*  --   --  142*  --   --   --   --   APTT  --   --  44*  --   --   --   --   --  35  --   --   LABPROT  --   --  28.1*  --   --  17.4*  --   --  17.1*  --   --   INR  --   --  2.7*  --   --  1.5*  --   --  1.5*  --   --   CREATININE 3.45*  --   --  2.75*  --   --  2.53*  --   --   --   --   TROPONINIHS  --   --   --   --   --   --   --  61*  --   --  308*   < > = values in this interval not displayed.    Estimated Creatinine Clearance: 26.8 mL/min (A) (by C-G formula based on SCr of 2.53 mg/dL (H)).  Assessment: 79 yo m admitted to ICU after code during intubation in IR prior to perc chole  Has trop elevation Bedside echo shows wall motion abnormalities  Was on warfarin pta - has been on hold  Goal of Therapy:  Heparin level 0.3-0.7 units/ml Monitor platelets by anticoagulation protocol: Yes   Plan:  Heparin bolus 4000 units/hr Heparin gtt 1050 units/hr Daily hep lvl cbc F/u cards consult in am  76, PharmD, BCPS, BCCCP Clinical Pharmacist [phone]  Please check AMION for all [facility] Pharmacy numbers  03/15/2020 9:30 PM

## 2020-03-15 NOTE — Progress Notes (Signed)
Requested order for PRN straight cath due to bladder scan volume .

## 2020-03-15 NOTE — Consult Note (Addendum)
NAME:  Isaiah [name], MRN:  [id number], DOB:  [birth year], LOS: 1 ADMISSION DATE:  03/14/2020, CONSULTATION DATE:  03/15/20 REFERRING MD:  Anesthesia, CHIEF COMPLAINT:  Code Blue     Brief History   79 y.o. M who was admitted for acute cholecystitis and taken to IR for Perc Chole tube placement and coded during the procedure.  Transferred to intensive care   History of present illness   79 y.o. M with PMH of atrial fibrillation, CAD, CHF, DM, OSA, PVD who presented with weakness and sepsis and found to have acute cholecystitis and taken to IR for Perc cholecystostomy tube placement. Shortly after intubation, pt had a PEA arrest, ROSC obtained after one round ACLS, then patient apparently converted to V-fib and lost pulses.  He converted back to sinus quickly with amiodarone.   Pt was transferred to intensive care on multiple pressors.  Of note, had somnolence earlier 11/2 after Morphine, this was was treated with Narcan and improved. Received Ketamine, propofol and rocuronium for   Past Medical History   has a past medical history of Atrial fibrillation (HCC), CAD (coronary artery disease), CHF (congestive heart failure) (HCC), Dementia (HCC), Diabetes mellitus, DVT (deep venous thrombosis) (HCC), Fall at home (10/2015), Hyperlipidemia, Hypertension, Left-sided carotid artery disease (HCC), MRSA infection, Myocardial infarction (HCC), Obesity, Peripheral vascular disease (HCC), Sleep apnea, Stroke (HCC), Stroke (HCC), and Venous insufficiency.   Significant Hospital Events   11/1 Admit to hospitalists 11/2 To IR>code blue>PCCM consult  Consults:  PCCM IR Surgery  Procedures:  11/2 ETT  Significant Diagnostic Tests:  11/1 CT head>> no acute findings, small vessel ischemic changes  11/1 CT abd/pelvis>>Distended gallbladder with layering gallstones and mild pericholecystic stranding, raising concern for acute cholecystitis.  Micro Data:  11/1 Covid and Flu>>negative 11/2 BCx2>> 11/2  UC>>  Antimicrobials:  Cefepime 11/1 Ceftriaxone 11/2 Flagyl 11/1-11/2 Zosyn 11/2-  Interim history/subjective:  As above  Objective   Blood pressure (!) 118/55, pulse 83, temperature 98.7 F (37.1 C), temperature source Oral, resp. rate 20, height 5\' 9"  (1.753 m), weight 94.2 kg, SpO2 100 %.        Intake/Output Summary (Last 24 hours) at 03/15/2020 1717 Last data filed at 03/15/2020 1318 Gross per 24 hour  Intake 785.35 ml  Output 850 ml  Net -64.65 ml   Filed Weights   03/14/20 0810 03/14/20 2205  Weight: 95.3 kg 94.2 kg    General:  Overweight M intubated, unresponsive after RSI medications HEENT: MM pink/dry, ETT in place Neuro: pupils equal and responsive , otherwise unresponsive ~1hr after rocuronium CV: s1s2 rrr, no m/r/g PULM:  synchronous with vent, clear bilaterally  GI: soft, bsx4 active  Extremities: warm/dry, 1+ edema  Skin: no rashes or lesions   Resolved Hospital Problem list     Assessment & Plan:   Cardiac arrest  PEA and VF. Brief arrest. Only received one round of CPR. VF converted without defibrillation, but amiodarone was given.  Intubated prior to procedure.  Received Ketamine 20mg , Propofol 80mg  and Rocuronium  per anesthesia.  ABG unremarkable P: - Transfer to ICU -Echo - Will need to wake him up to assess neurological function after brief arrest. If poor exam will need to CT head, EEG - Target Normothermia  --Maintain full vent support with SAT/SBT as tolerated -titrate Vent setting to maintain SpO2 greater than or equal to 90%. -HOB elevated 30 degrees. -Plateau pressures less than 30 cm H20.  -Follow chest x-ray, ABG prn.   -Bronchial hygiene  and RT/bronchodilator protocol.   Shock septic vs cardiogenic. Likely a hypovolemic component as well.  P: -check lactic and broaden to Zosyn - MAP goal 65 mmHg - Norepinephrine infusion, down-trending after ICU transfer - IVF resuscitation - Add vasopressin - Place CVL if does not  improve with IVF bolus - Echocardiogram - Holding home imdur, hydralazine, lasix, zaroxolyn   Acute encephalopathy brief arrest so hopefully not anoxic injury. Hopefully this is largely anesthesia related.  P: - Minimize sedation as able - PRN fentanyl/versed for RASS goal 0 to -1.   Acute cholecystitis P: -check lactic acid - Surgery, IR following - flagyl, ceftriaxone > transition to Zosyn - Perc chole drain unable to be placed due to decompensation/arrest prior.  - Initial plans for lap chole 6-8 weeks.   DM with polyneuropathy - CBG monitoring and SSI - hold lantus while NPO - hold gabapentin  Atrial fibrillation - Holding AC for supratherapeutic INR and need for instrumentation.  - Telemetry monitoring -Junctional rhythm with ectopy, start amiodarone gtt  AKI Hypokalemia - Volume - BP support - Follow BMP - replace K  Bilateral buttock pressure injuries, present on arrival.  - WOC following.    Best practice:  Diet: NPO Pain/Anxiety/Delirium protocol (if indicated): PRN VAP protocol (if indicated): Per protocol DVT prophylaxis: holding, SCDs GI prophylaxis: PPI Glucose control: SSI Mobility: BR Code Status: FULL Family Communication: Son updated bedside.  Disposition: ICU      Labs   CBC: Recent Labs  Lab 03/14/20 0743 03/15/20 0027 03/15/20 1650  WBC 21.2* 17.1* 21.3*  NEUTROABS 18.6*  --   --   HGB 13.9 12.7* 11.8*  HCT 43.4 39.3 36.8*  MCV 105.3* 104.0* 105.1*  PLT 141* 127* 142*    Basic Metabolic Panel: Recent Labs  Lab 03/14/20 0743 03/15/20 0027  NA 130* 131*  K 3.5 3.0*  CL 78* 86*  CO2 35* 33*  GLUCOSE 306* 207*  BUN 73* 83*  CREATININE 3.45* 2.75*  CALCIUM 10.0 9.2  MG  --  2.6*   GFR: Estimated Creatinine Clearance: 24.7 mL/min (A) (by C-G formula based on SCr of 2.75 mg/dL (H)). Recent Labs  Lab 03/14/20 0743 03/14/20 0818 03/14/20 2125 03/15/20 0027 03/15/20 1650  PROCALCITON  --  7.85  --   --    --   WBC 21.2*  --   --  17.1* 21.3*  LATICACIDVEN 2.2*  --  1.2 0.9  --     Liver Function Tests: Recent Labs  Lab 03/14/20 0743  AST 24  ALT 13  ALKPHOS 82  BILITOT 1.1  PROT 7.8  ALBUMIN 2.8*   Recent Labs  Lab 03/14/20 0818  LIPASE 35   Recent Labs  Lab 03/14/20 0744  AMMONIA 17    ABG    Component Value Date/Time   PHART 7.442 03/15/2020 1140   PCO2ART 54.8 (H) 03/15/2020 1140   PO2ART 128 (H) 03/15/2020 1140   HCO3 36.8 (H) 03/15/2020 1140   O2SAT 98.8 03/15/2020 1140     Coagulation Profile: Recent Labs  Lab 03/14/20 0911 03/15/20 0643  INR 2.7* 1.5*    Cardiac Enzymes: No results for input(s): CKTOTAL, CKMB, CKMBINDEX, TROPONINI in the last 168 hours.  HbA1C: Hgb A1c MFr Bld  Date/Time Value Ref Range Status  03/14/2020 09:25 PM 12.1 (H) 4.8 - 5.6 % Final    Comment:    (NOTE) Pre diabetes:          5.7%-6.4%  Diabetes:              >  6.4%  Glycemic control for   <7.0% adults with diabetes   12/12/2018 09:01 PM 10.0 (H) 4.8 - 5.6 % Final    Comment:    (NOTE) Pre diabetes:          5.7%-6.4% Diabetes:              >6.4% Glycemic control for   <7.0% adults with diabetes     CBG: Recent Labs  Lab 03/14/20 1657 03/14/20 2255 03/15/20 0408 03/15/20 0729 03/15/20 1148  GLUCAP 233* 192* 233* 263* 250*    Review of Systems:   As above   Past Medical History  He,  has a past medical history of Atrial fibrillation (HCC), CAD (coronary artery disease), CHF (congestive heart failure) (HCC), Dementia (HCC), Diabetes mellitus, DVT (deep venous thrombosis) (HCC), Fall at home (10/2015), Hyperlipidemia, Hypertension, Left-sided carotid artery disease (HCC), MRSA infection, Myocardial infarction (HCC), Obesity, Peripheral vascular disease (HCC), Sleep apnea, Stroke (HCC), Stroke (HCC), and Venous insufficiency.   Surgical History    Past Surgical History:  Procedure Laterality Date  . CORONARY ARTERY BYPASS GRAFT  1997   vein harvest  from right leg  . CORONARY STENT INTERVENTION N/A 08/07/2016   Procedure: Coronary Stent Intervention;  Surgeon: Corky Crafts, MD;  Location: [facility] INVASIVE CV LAB;  Service: Cardiovascular;  Laterality: N/A;  RCA  . ESOPHAGOGASTRODUODENOSCOPY N/A 08/12/2016   Procedure: ESOPHAGOGASTRODUODENOSCOPY (EGD);  Surgeon: Carman Ching, MD;  Location: [facility] ENDOSCOPY;  Service: Endoscopy;  Laterality: N/A;  . INGUINAL HERNIA REPAIR  1980's   Bilateral,  done in [place]  . LEFT HEART CATH AND CORS/GRAFTS ANGIOGRAPHY N/A 08/07/2016   Procedure: Left Heart Cath and Cors/Grafts Angiography;  Surgeon: Corky Crafts, MD;  Location: [facility] INVASIVE CV LAB;  Service: Cardiovascular;  Laterality: N/A;  . PR VEIN BYPASS GRAFT,AORTO-FEM-POP  1980   FEM-FEM BPG by [name]     Social History   reports that he quit smoking about 41 years ago. His smoking use included cigarettes. He has never used smokeless tobacco. He reports that he does not drink alcohol and does not use drugs.   Family History   His family history includes Cancer in his father; Diabetes in his daughter, father, and sister; Heart attack in his brother and father; Heart disease in his father, mother, and sister; Hyperlipidemia in his brother, daughter, father, mother, and sister; Hypertension in his brother, daughter, father, mother, sister, and son; Other in his brother, daughter, father, mother, and sister; Varicose Veins in his daughter, mother, and sister.   Allergies No Known Allergies   Home Medications  Prior to Admission medications   Medication Sig Start Date End Date Taking? Authorizing Provider  acetaminophen (TYLENOL) 500 MG tablet Take 500-1,000 mg by mouth every 6 (six) hours as needed for mild pain or headache.   Yes [provider]  allopurinol (ZYLOPRIM) 100 MG tablet TAKE 2 TABLETS BY MOUTH DAILY. Patient taking differently: Take 100 mg by mouth 2 (two) times daily.  05/03/14  Yes Runell Gess, MD   colchicine 0.6 MG tablet Take 0.6 mg by mouth daily as needed (gout attacks).   Yes [provider]  ferrous sulfate 325 (65 FE) MG tablet Take 1 tablet (325 mg total) by mouth 2 (two) times daily with a meal. Patient taking differently: Take 325 mg by mouth daily with breakfast.  08/09/16  Yes Ghimire, Werner Lean, MD  furosemide (LASIX) 40 MG tablet Take 1 tablet (40 mg total) by mouth  2 (two) times daily. Patient taking differently: Take 40 mg by mouth in the morning.  08/24/16  Yes Osvaldo ShipperKrishnan, Gokul, MD  gabapentin (NEURONTIN) 300 MG capsule Take 300-600 mg by mouth See admin instructions. Take 300 mg by mouth three times a day and 600 mg at bedtime   Yes [provider]  glipiZIDE (GLUCOTROL) 5 MG tablet Take 5 mg by mouth 2 (two) times daily before a meal.   Yes [provider]  hydrALAZINE (APRESOLINE) 100 MG tablet Take 100 mg by mouth 2 (two) times daily.   Yes [provider]  insulin aspart protamine- aspart (NOVOLOG MIX 70/30) (70-30) 100 UNIT/ML injection Inject 50 Units into the skin 2 (two) times daily before a meal.    Yes [provider]  isosorbide mononitrate (IMDUR) 60 MG 24 hr tablet Take 1 tablet (60 mg total) by mouth daily. Appointment needed for future refills Patient taking differently: Take 60 mg by mouth daily.  05/03/15  Yes Runell GessBerry, Jonathan J, MD  metolazone (ZAROXOLYN) 2.5 MG tablet Take 2.5 mg by mouth daily as needed (AS DIRECTED FOR MARKED SWELLING).   Yes [provider]  Multiple Vitamin (MULTIVITAMIN WITH MINERALS) TABS tablet Take 1 tablet by mouth daily.   Yes [provider]  nitroGLYCERIN (NITROSTAT) 0.4 MG SL tablet Place 0.4 mg under the tongue every 5 (five) minutes as needed for chest pain.   Yes [provider]  pantoprazole (PROTONIX) 40 MG tablet Take 40 mg by mouth 2 (two) times daily before a meal.    Yes [provider]  potassium chloride (K-DUR) 10 MEQ tablet Take 1 tablet  (10 mEq total) by mouth daily. 08/31/16  Yes Barrett, Joline Salthonda G, PA-C  pravastatin (PRAVACHOL) 40 MG tablet Take 40 mg by mouth at bedtime.    Yes [provider]  PRESCRIPTION MEDICATION Inhale into the lungs See admin instructions. CPAP- At bedtime and during any time of rest   Yes [provider]  Psyllium (METAMUCIL PO) Take 2 capsules by mouth at bedtime.   Yes [provider]  QUEtiapine (SEROQUEL) 50 MG tablet Take 50 mg by mouth at bedtime.    Yes [provider]  warfarin (COUMADIN) 1 MG tablet Take 1 mg by mouth daily as needed (in conjunction with the 5 mg strength, AS DIRECTED- depending on INR).  12/18/19  Yes [provider]  warfarin (COUMADIN) 5 MG tablet Take 5 mg by mouth daily with supper. 01/28/20  Yes [provider]  docusate sodium (COLACE) 100 MG capsule Take 1 capsule (100 mg total) by mouth 2 (two) times daily. Patient not taking: Reported on 03/14/2020 12/16/18   Jadene Pierinistergard, Thomas A, MD  oxyCODONE (OXY IR/ROXICODONE) 5 MG immediate release tablet Take 1 tablet (5 mg total) by mouth every 4 (four) hours as needed (pain). Patient not taking: Reported on 03/14/2020 12/16/18   Jadene Pierinistergard, Thomas A, MD     Critical care time: 55 minutes     CRITICAL CARE Performed by: Darcella GasmanLaura R Kinzleigh Kandler   Total critical care time: 55 minutes  Critical care time was exclusive of separately billable procedures and treating other patients.  Critical care was necessary to treat or prevent imminent or life-threatening deterioration.  Critical care was time spent personally by me on the following activities: development of treatment plan with patient and/or surrogate as well as nursing, discussions with consultants, evaluation of patient's response to treatment, examination of patient, obtaining history from patient or surrogate, ordering and performing  treatments and interventions, ordering and review of laboratory studies, ordering and review of  radiographic studies, pulse oximetry and re-evaluation of patient's condition.  Darcella Gasman Preeti Winegardner, PA-C [place] [facility]  Pager# [phone], if no answer [phone]

## 2020-03-15 NOTE — Consult Note (Addendum)
WOC Nurse Consult Note: Reason for Consult: Stage 2 right and left buttocks Wound type: Stage 2 bilateral buttocks, partial thickness Pressure Injury POA: Yes Measurement:  Right buttock: 2.8 cm x 1.8 cm x 0.1 cm Left buttock: 3.6 cm x 2 cm x 0.1 cm Wound bed: Pink, red, friable Drainage (amount, consistency, odor) scant serosanguinous drainage on foam dressing Periwound: Intact Dressing procedure/placement/frequency: Clean buttocks with soap and water. Pat dry. Wipe surrounding area of left and right buttock wound with skin barrier wipe Hart [place] # [phone]) then apply Hydrocolloid dressing Hart [place] # [phone]), change daily. Order and place patient on standard low air loss mattress. Order and place chair redistribution pad Hart [place] # [phone]) if patient up in chair.   Monitor the wound area(s) for worsening of condition such as: Signs/symptoms of infection, increase in size, development of or worsening of odor, development of pain, or increased pain at the affected locations.   Notify the medical team if any of these develop.  Thank you for the consult. WOC nurse will not follow at this time.   Please re-consult the WOC team if needed.  Renaldo Reel Katrinka Blazing, MSN, RN, CMSRN, Angus Seller, [facility] Wound Treatment Associate Pager [phone]

## 2020-03-15 NOTE — Progress Notes (Signed)
eLink Physician-Brief Progress Note Patient Name: Isaiah [name] DOB: 30-Mar-1941 MRN: [id number]   Date of Service  03/15/2020  HPI/Events of Note  Troponin = 61 --> 308. Patient on Norepinephrine IV infusion, therefore, can't B-Block.  eICU Interventions  Plan: 1. 12 Lead EKG STAT. 2. ASA 81 mg per tube now and Q day.' 3. Heparin IV infusion per pharmacy.     Intervention Category Major Interventions: Other:  Lenell Antu 03/15/2020, 8:42 PM

## 2020-03-15 NOTE — Significant Event (Signed)
Rapid Response Event Note   Reason for Call :  Difficult to arouse  Initial Focused Assessment:  Patient had just received narcan He will open his eyes and and answer a few questions with delayed response.  He is generally weak.  No specific focal weakness. He is waking up more with repeated stimulation Lung sounds decreased at bases  139/60  SR 80  RR 20  O2 sat 97% on RA CBG 263  Dr Maryfrances Bunnell at bedside   Interventions:  Narcan  ABG   Plan of Care:  Follow up on ABG results 7.44/54/128/36.8  Event Summary:   MD Notified: Dr Maryfrances Bunnell at bedside Call Time: 1058 Arrival Time: 1102 End Time:  1130  Marcellina Millin, RN

## 2020-03-16 ENCOUNTER — Inpatient Hospital Stay (HOSPITAL_COMMUNITY): Payer: Medicare Other

## 2020-03-16 ENCOUNTER — Encounter (HOSPITAL_COMMUNITY): Payer: Self-pay | Admitting: Interventional Radiology

## 2020-03-16 DIAGNOSIS — N183 Chronic kidney disease, stage 3 unspecified: Secondary | ICD-10-CM | POA: Diagnosis not present

## 2020-03-16 DIAGNOSIS — K819 Cholecystitis, unspecified: Secondary | ICD-10-CM | POA: Diagnosis not present

## 2020-03-16 DIAGNOSIS — I482 Chronic atrial fibrillation, unspecified: Secondary | ICD-10-CM

## 2020-03-16 DIAGNOSIS — I5021 Acute systolic (congestive) heart failure: Secondary | ICD-10-CM | POA: Diagnosis not present

## 2020-03-16 DIAGNOSIS — I462 Cardiac arrest due to underlying cardiac condition: Secondary | ICD-10-CM

## 2020-03-16 DIAGNOSIS — K810 Acute cholecystitis: Secondary | ICD-10-CM | POA: Diagnosis not present

## 2020-03-16 DIAGNOSIS — N179 Acute kidney failure, unspecified: Secondary | ICD-10-CM | POA: Diagnosis not present

## 2020-03-16 DIAGNOSIS — I469 Cardiac arrest, cause unspecified: Secondary | ICD-10-CM | POA: Diagnosis not present

## 2020-03-16 HISTORY — PX: IR PERC CHOLECYSTOSTOMY: IMG2326

## 2020-03-16 LAB — ECHOCARDIOGRAM COMPLETE
AR max vel: 1.64 cm2
AV Area VTI: 1.61 cm2
AV Area mean vel: 1.59 cm2
AV Mean grad: 2 mmHg
AV Peak grad: 3.5 mmHg
Ao pk vel: 0.94 m/s
Area-P 1/2: 3.53 cm2
Height: 69 in
S' Lateral: 3.03 cm
Weight: 3322.77 oz

## 2020-03-16 LAB — GLUCOSE, CAPILLARY
Glucose-Capillary: 281 mg/dL — ABNORMAL HIGH (ref 70–99)
Glucose-Capillary: 253 mg/dL — ABNORMAL HIGH (ref 70–99)
Glucose-Capillary: 164 mg/dL — ABNORMAL HIGH (ref 70–99)
Glucose-Capillary: 185 mg/dL — ABNORMAL HIGH (ref 70–99)
Glucose-Capillary: 151 mg/dL — ABNORMAL HIGH (ref 70–99)
Glucose-Capillary: 144 mg/dL — ABNORMAL HIGH (ref 70–99)

## 2020-03-16 LAB — CBC
HCT: 35.8 % — ABNORMAL LOW (ref 39.0–52.0)
Hemoglobin: 11.8 g/dL — ABNORMAL LOW (ref 13.0–17.0)
MCH: 33.2 pg (ref 26.0–34.0)
MCHC: 33 g/dL (ref 30.0–36.0)
MCV: 100.8 fL — ABNORMAL HIGH (ref 80.0–100.0)
Platelets: 179 10*3/uL (ref 150–400)
RBC: 3.55 MIL/uL — ABNORMAL LOW (ref 4.22–5.81)
RDW: 13.6 % (ref 11.5–15.5)
WBC: 14.6 10*3/uL — ABNORMAL HIGH (ref 4.0–10.5)
nRBC: 0 % (ref 0.0–0.2)

## 2020-03-16 LAB — HEPARIN LEVEL (UNFRACTIONATED): Heparin Unfractionated: <0.1 IU/mL — ABNORMAL LOW (ref 0.30–0.70)

## 2020-03-16 LAB — BASIC METABOLIC PANEL
Anion gap: 16 — ABNORMAL HIGH (ref 5–15)
BUN: 92 mg/dL — ABNORMAL HIGH (ref 8–23)
CO2: 25 mmol/L (ref 22–32)
Calcium: 8.3 mg/dL — ABNORMAL LOW (ref 8.9–10.3)
Chloride: 90 mmol/L — ABNORMAL LOW (ref 98–111)
Creatinine, Ser: 2.47 mg/dL — ABNORMAL HIGH (ref 0.61–1.24)
GFR, Estimated: 26 mL/min — ABNORMAL LOW (ref >60)
Glucose, Bld: 309 mg/dL — ABNORMAL HIGH (ref 70–99)
Potassium: 4.1 mmol/L (ref 3.5–5.1)
Sodium: 131 mmol/L — ABNORMAL LOW (ref 135–145)

## 2020-03-16 LAB — POCT I-STAT 7, (LYTES, BLD GAS, ICA,H+H)
Acid-Base Excess: 10 mmol/L — ABNORMAL HIGH (ref 0.0–2.0)
Bicarbonate: 32.6 mmol/L — ABNORMAL HIGH (ref 20.0–28.0)
Calcium, Ion: 1.05 mmol/L — ABNORMAL LOW (ref 1.15–1.40)
HCT: 37 % — ABNORMAL LOW (ref 39.0–52.0)
Hemoglobin: 12.6 g/dL — ABNORMAL LOW (ref 13.0–17.0)
O2 Saturation: 100 %
Patient temperature: 99.8
Potassium: 4.2 mmol/L (ref 3.5–5.1)
Sodium: 132 mmol/L — ABNORMAL LOW (ref 135–145)
TCO2: 34 mmol/L — ABNORMAL HIGH (ref 22–32)
pCO2 arterial: 36.4 mmHg (ref 32.0–48.0)
pH, Arterial: 7.562 — ABNORMAL HIGH (ref 7.350–7.450)
pO2, Arterial: 189 mmHg — ABNORMAL HIGH (ref 83.0–108.0)

## 2020-03-16 LAB — TROPONIN I (HIGH SENSITIVITY)
Troponin I (High Sensitivity): 1364 ng/L (ref <18)
Troponin I (High Sensitivity): 982 ng/L (ref <18)

## 2020-03-16 LAB — MAGNESIUM: Magnesium: 2.3 mg/dL (ref 1.7–2.4)

## 2020-03-16 LAB — PHOSPHORUS: Phosphorus: 3.7 mg/dL (ref 2.5–4.6)

## 2020-03-16 MED ORDER — FENTANYL CITRATE (PF) 100 MCG/2ML IJ SOLN
INTRAMUSCULAR | Status: AC
  Filled 2020-03-16: qty 2

## 2020-03-16 MED ORDER — CHLORHEXIDINE GLUCONATE 0.12% ORAL RINSE (MEDLINE KIT)
15.0000 mL | Freq: Two times a day (BID) | OROMUCOSAL | Status: DC
  Administered 2020-03-16 (×2): 15 mL via OROMUCOSAL

## 2020-03-16 MED ORDER — PERFLUTREN LIPID MICROSPHERE
1.0000 mL | INTRAVENOUS | Status: AC | PRN
  Administered 2020-03-16: 4 mL via INTRAVENOUS
  Filled 2020-03-16: qty 10

## 2020-03-16 MED ORDER — HEPARIN (PORCINE) 25000 UT/250ML-% IV SOLN
2050.0000 [IU]/h | INTRAVENOUS | Status: DC
  Administered 2020-03-17: 1700 [IU]/h via INTRAVENOUS
  Administered 2020-03-17 – 2020-03-19 (×3): 2050 [IU]/h via INTRAVENOUS
  Filled 2020-03-16 (×4): qty 250

## 2020-03-16 MED ORDER — MIDAZOLAM HCL 2 MG/2ML IJ SOLN
INTRAMUSCULAR | Status: AC
  Filled 2020-03-16: qty 2

## 2020-03-16 MED ORDER — ORAL CARE MOUTH RINSE
15.0000 mL | OROMUCOSAL | Status: DC
  Administered 2020-03-16 (×6): 15 mL via OROMUCOSAL

## 2020-03-16 MED ORDER — IOHEXOL 300 MG/ML  SOLN
50.0000 mL | Freq: Once | INTRAMUSCULAR | Status: AC | PRN
  Administered 2020-03-16: 10 mL

## 2020-03-16 MED ORDER — HEPARIN BOLUS VIA INFUSION
3000.0000 [IU] | Freq: Once | INTRAVENOUS | Status: AC
  Administered 2020-03-16: 3000 [IU] via INTRAVENOUS
  Filled 2020-03-16: qty 3000

## 2020-03-16 MED ORDER — CHLORHEXIDINE GLUCONATE 0.12% ORAL RINSE (MEDLINE KIT)
15.0000 mL | Freq: Two times a day (BID) | OROMUCOSAL | Status: DC
  Administered 2020-03-17 – 2020-04-05 (×17): 15 mL via OROMUCOSAL

## 2020-03-16 MED ORDER — LIDOCAINE HCL 1 % IJ SOLN
INTRAMUSCULAR | Status: AC
  Filled 2020-03-16: qty 20

## 2020-03-16 MED ORDER — ORAL CARE MOUTH RINSE
15.0000 mL | OROMUCOSAL | Status: DC
  Administered 2020-03-16 – 2020-03-18 (×13): 15 mL via OROMUCOSAL

## 2020-03-16 MED ORDER — INSULIN GLARGINE 100 UNIT/ML ~~LOC~~ SOLN
25.0000 [IU] | Freq: Two times a day (BID) | SUBCUTANEOUS | Status: DC
  Administered 2020-03-16 – 2020-03-17 (×4): 25 [IU] via SUBCUTANEOUS
  Filled 2020-03-16 (×6): qty 0.25

## 2020-03-16 MED ORDER — LIDOCAINE HCL (PF) 1 % IJ SOLN
INTRAMUSCULAR | Status: AC | PRN
  Administered 2020-03-16: 5 mL

## 2020-03-16 NOTE — Progress Notes (Signed)
[facility] Progress Note  1 Day Post-Op  Subjective: CC-  Events from yesterday noted. PEA arrest following intubation for his perc chole. Now in ICU, on the vent, pressor support Levo. Troponin up to 1364. Son at bedside. Patient is awake. Nods yes when asked if he still has abdomen pain. WBC down 14.6, TMAX 100.3.  Objective: Vital signs in last 24 hours: Temp:  [98.2 F (36.8 C)-100.3 F (37.9 C)] 98.2 F (36.8 C) (11/03 0754) Pulse Rate:  [63-124] 64 (11/03 0845) Resp:  [18-27] 21 (11/03 0845) BP: (85-139)/(48-68) 114/64 (11/03 0800) SpO2:  [95 %-100 %] 100 % (11/03 0845) Arterial Line BP: (76-154)/(40-69) 110/51 (11/03 0845) FiO2 (%):  [40 %-100 %] 40 % (11/03 0338)    Intake/Output from previous day: 11/02 0701 - 11/03 0700 In: 5436.1 [I.V.:4200.3; IV Piggyback:1235.8] Out: 1600 [Urine:1000; Emesis/NG output:600] Intake/Output this shift: Total I/O In: 153.6 [I.V.:141.1; IV Piggyback:12.4] Out: -   PE: Gen:  Alert on the vent, NAD Card: RRR Pulm:  mechanically ventilated Abd: Soft, protuberant, hypoactive BS, mild diffuse TTP with significant RUQ and epigastric TTP MSK: lower extremities are cool, 1+ edema  Lab Results:  Recent Labs    03/15/20 1650 03/15/20 1802 03/16/20 0309 03/16/20 0347  WBC 21.3*  --  14.6*  --   HGB 11.8*   < > 11.8* 12.6*  HCT 36.8*   < > 35.8* 37.0*  PLT 142*  --  179  --    < > = values in this interval not displayed.   BMET Recent Labs    03/15/20 1650 03/15/20 1802 03/16/20 0309 03/16/20 0347  NA 135   < > 131* 132*  K 3.3*   < > 4.1 4.2  CL 90*  --  90*  --   CO2 34*  --  25  --   GLUCOSE 198*  --  309*  --   BUN 85*  --  92*  --   CREATININE 2.53*  --  2.47*  --   CALCIUM 8.4*  --  8.3*  --    < > = values in this interval not displayed.   PT/INR Recent Labs    03/15/20 0643 03/15/20 1757  LABPROT 17.4* 17.1*  INR 1.5* 1.5*   CMP     Component Value Date/Time   NA 132 (L) 03/16/2020  0347   K 4.2 03/16/2020 0347   CL 90 (L) 03/16/2020 0309   CO2 25 03/16/2020 0309   GLUCOSE 309 (H) 03/16/2020 0309   BUN 92 (H) 03/16/2020 0309   CREATININE 2.47 (H) 03/16/2020 0309   CREATININE 1.77 (H) 08/28/2016 1113   CALCIUM 8.3 (L) 03/16/2020 0309   PROT 7.8 03/14/2020 0743   ALBUMIN 2.8 (L) 03/14/2020 0743   AST 24 03/14/2020 0743   ALT 13 03/14/2020 0743   ALKPHOS 82 03/14/2020 0743   BILITOT 1.1 03/14/2020 0743   GFRNONAA 26 (L) 03/16/2020 0309   GFRAA 46 (L) 12/14/2018 0825   Lipase     Component Value Date/Time   LIPASE 35 03/14/2020 0818       Studies/Results: CT ABDOMEN PELVIS WO CONTRAST  Result Date: 03/14/2020 CLINICAL DATA:  Right upper quadrant abdominal pain EXAM: CT ABDOMEN AND PELVIS WITHOUT CONTRAST TECHNIQUE: Multidetector CT imaging of the abdomen and pelvis was performed following the standard protocol without IV contrast. COMPARISON:  12/08/2018 FINDINGS: Motion degraded images. Lower chest: Trace bilateral pleural effusions. Associated lower lobe opacities, likely atelectasis. Hepatobiliary: Cirrhosis.  No focal  hepatic lesion is seen. Distended gallbladder with layering gallstones (series 3/image 36). Mild pericholecystic stranding (series 3/image 40), raising concern for acute cholecystitis. No intrahepatic or extrahepatic ductal dilatation. Pancreas: Within normal limits. Spleen: Within normal limits. Adrenals/Urinary Tract: Adrenal glands are within normal limits. 2.3 cm medial right lower pole renal cyst (series 3/image 45). Bilateral kidneys are otherwise unremarkable, noting renal vascular calcifications. No hydronephrosis. Mildly thick-walled bladder, although underdistended. Stomach/Bowel: Stomach is within normal limits. No evidence of bowel obstruction. Appendix is not discretely visualized. Scattered sigmoid diverticulosis, without evidence of diverticulitis. Vascular/Lymphatic: No evidence of abdominal aortic aneurysm. Extensive atherosclerotic  calcifications of the abdominal aorta and branch vessels. No suspicious abdominopelvic lymphadenopathy. Reproductive: Prostate is unremarkable. Other: No abdominopelvic ascites. Musculoskeletal: Degenerative changes of the visualized thoracolumbar spine. Posterior thoracic fixation ending at L1, incompletely visualized. Median sternotomy. IMPRESSION: Motion degraded images. Distended gallbladder with layering gallstones and mild pericholecystic stranding, raising concern for acute cholecystitis. Additional ancillary findings as above. Electronically Signed   By: Charline Bills M.D.   On: 03/14/2020 09:50   CT Head Wo Contrast  Result Date: 03/14/2020 CLINICAL DATA:  Altered mental status/delirium EXAM: CT HEAD WITHOUT CONTRAST TECHNIQUE: Contiguous axial images were obtained from the base of the skull through the vertex without intravenous contrast. COMPARISON:  08/11/2016 FINDINGS: Brain: No evidence of acute infarction, hemorrhage, hydrocephalus, extra-axial collection or mass lesion/mass effect. Global cortical atrophy. Subcortical white matter and periventricular small vessel ischemic changes. Vascular: Intracranial atherosclerosis. Skull: Normal. Negative for fracture or focal lesion. Sinuses/Orbits: The visualized paranasal sinuses are essentially clear. The mastoid air cells are unopacified. Other: None. IMPRESSION: No evidence of acute intracranial abnormality. Atrophy with small vessel ischemic changes. Electronically Signed   By: Charline Bills M.D.   On: 03/14/2020 09:36   DG CHEST PORT 1 VIEW  Result Date: 03/15/2020 CLINICAL DATA:  Endotracheal tube placement EXAM: PORTABLE CHEST 1 VIEW COMPARISON:  03/14/2020 FINDINGS: Cardiac shadow is enlarged but stable. Postsurgical changes are again seen. Endotracheal tube is noted in satisfactory position 3.5 cm above the carina. Gastric catheter extends into the stomach. Lungs are hypoinflated with minimal left basilar atelectasis. Mild vascular  congestion is noted. No bony abnormality is seen. IMPRESSION: Endotracheal tube and gastric catheter in satisfactory position. Mild vascular congestion. Mild left basilar atelectasis new from the prior exam. Electronically Signed   By: Alcide Clever M.D.   On: 03/15/2020 18:39    Anti-infectives: Anti-infectives (From admission, onward)   Start     Dose/Rate Route Frequency Ordered Stop   03/15/20 2200  piperacillin-tazobactam (ZOSYN) IVPB 3.375 g        3.375 g 12.5 mL/hr over 240 Minutes Intravenous Every 8 hours 03/15/20 1755     03/15/20 1000  ceFEPIme (MAXIPIME) 2 g in sodium chloride 0.9 % 100 mL IVPB  Status:  Discontinued        2 g 200 mL/hr over 30 Minutes Intravenous Every 24 hours 03/14/20 1020 03/15/20 0843   03/15/20 0930  cefTRIAXone (ROCEPHIN) 2 g in sodium chloride 0.9 % 100 mL IVPB  Status:  Discontinued        2 g 200 mL/hr over 30 Minutes Intravenous Every 24 hours 03/15/20 0844 03/15/20 1755   03/14/20 1600  metroNIDAZOLE (FLAGYL) IVPB 500 mg  Status:  Discontinued        500 mg 100 mL/hr over 60 Minutes Intravenous Every 8 hours 03/14/20 1427 03/15/20 1755   03/14/20 0800  ceFEPIme (MAXIPIME) 2 g in sodium chloride 0.9 %  100 mL IVPB        2 g 200 mL/hr over 30 Minutes Intravenous  Once 03/14/20 0752 03/14/20 0845   03/14/20 0800  metroNIDAZOLE (FLAGYL) IVPB 500 mg        500 mg 100 mL/hr over 60 Minutes Intravenous  Once 03/14/20 0752 03/14/20 0935       Assessment/Plan CAD - followed by Dr. Allyson Sabal CHF - ECHO 07/2016 EF 55-65% A Fib on coumadin - INRdown to 1.5 (11/2) Hx DVT DM HTN HLD PVD Hx CVA Obesity BMI 33.89 AKI PEA arrest Shock VDRF  Acute cholecystitis - Patient with PEA arrest following intubation for perc chole yesterday, tube not placed. Continue resuscitation per CCM. Will discuss with IR timing of per chole tube placement. Continue IV antibiotics for now.   ID -maxipime 11/1, flagyl 11/1>>11/2, rocephin 11/2>>11/2, zosyn 11/2>> VTE  -heparin gtt FEN -IVF, OG to LIWS Foley -none Follow up -TBD   LOS: 2 days    Franne Forts, [facility] 03/16/2020, 9:00 AM Please see Amion for pager number during day hours 7:00am-4:30pm

## 2020-03-16 NOTE — Progress Notes (Signed)
ANTICOAGULATION CONSULT NOTE - Follow Up Consult  Pharmacy Consult for heparin Indication: chest pain/ACS  Labs: Recent Labs    03/14/20 0743 03/14/20 0911 03/15/20 0027 03/15/20 0027 03/15/20 0643 03/15/20 1650 03/15/20 1650 03/15/20 1705 03/15/20 1757 03/15/20 1802 03/15/20 1802 03/15/20 1916 03/16/20 0008 03/16/20 0309 03/16/20 0347 03/16/20 0521  HGB   < >  --  12.7*   < >  --  11.8*   < >  --   --  10.9*   < >  --   --  11.8* 12.6*  --   HCT   < >  --  39.3   < >  --  36.8*   < >  --   --  32.0*  --   --   --  35.8* 37.0*  --   PLT   < >  --  127*  --   --  142*  --   --   --   --   --   --   --  179  --   --   APTT  --  44*  --   --   --   --   --   --  35  --   --   --   --   --   --   --   LABPROT  --  28.1*  --   --  17.4*  --   --   --  17.1*  --   --   --   --   --   --   --   INR  --  2.7*  --   --  1.5*  --   --   --  1.5*  --   --   --   --   --   --   --   HEPARINUNFRC  --   --   --   --   --   --   --   --   --   --   --   --   --   --   --  <0.10*  CREATININE   < >  --  2.75*  --   --  2.53*  --   --   --   --   --   --   --  2.47*  --   --   TROPONINIHS  --   --   --   --   --   --   --    < >  --   --   --  308* 982* 1,364*  --   --    < > = values in this interval not displayed.    Assessment: 79yo male subtherapeutic on heparin with initial dosing for ACS; no gtt issues or signs of bleeding per RN.  Goal of Therapy:  Heparin level 0.3-0.7 units/ml   Plan:  Will give heparin bolus of 3000 units and increase heparin gtt by 4 units/kgABW/hr to 1400 units/hr and check level in 8 hours.    Vernard Gambles, PharmD, BCPS  03/16/2020,6:18 AM

## 2020-03-16 NOTE — Evaluation (Signed)
Physical Therapy Evaluation Patient Details Name: Isaiah [name] MRN: [id number] DOB: Apr 17, 1941 Today's Date: 03/16/2020   History of Present Illness  Isaiah [name] is a 79 y.o. male with medical history significant of PVD; CAD; HTN; HLD; DM; afib on Coumadin; and CHF presenting with weakness and a buttock pressure ulcer. Pt with R UQ abdominal pain, found to have cholecystitis. Pt was undergoing anesthesia for a perc chole drain and coded. Pt now intubated on 03-17-2023.    Clinical Impression  Pt intubated but able to follow commands and was able to participate in PT eval. Pt transferred to EOB and tolerated x 8 min. VSS. Pt shaking head yes/no appropriately and giving thumbs up and showing 2 fingers. PT to continue to progress mobility and re-assess d/c recommendations as patient still hasn't had his perc chole drain placed. Pt with good home set up and support. Acute PT to cont to follow.    Follow Up Recommendations SNF;Supervision/Assistance - 24 hour (will continue to assess)    Equipment Recommendations  None recommended by PT (has RW)    Recommendations for Other Services       Precautions / Restrictions Precautions Precautions: Fall Precaution Comments: intubated Restrictions Weight Bearing Restrictions: No      Mobility  Bed Mobility Overal bed mobility: Needs Assistance Bed Mobility: Rolling;Sidelying to Sit;Sit to Supine Rolling: Total assist;+2 for physical assistance Sidelying to sit: Total assist;+2 for physical assistance   Sit to supine: Total assist;+2 for physical assistance   General bed mobility comments: pt with minimal active participation but wasn't resisting, RN watch vent tube and lines for return to supine     Transfers                 General transfer comment: not safe at this time  Ambulation/Gait                Stairs            Wheelchair Mobility    Modified Rankin (Stroke Patients Only)       Balance Overall  balance assessment: Needs assistance Sitting-balance support: Feet supported;Bilateral upper extremity supported Sitting balance-Leahy Scale: Zero Sitting balance - Comments: depenent on posterior support, pt did attempt bilat LAQ EOB                                     Pertinent Vitals/Pain Pain Assessment: Faces Faces Pain Scale: Hurts little more Pain Location: generalized grimacing with movement    Home Living Family/patient expects to be discharged to:: Private residence Living Arrangements: Children Available Help at Discharge: Family;Available 24 hours/day Type of Home: House Home Access: Ramped entrance     Home Layout: Able to live on main level with bedroom/bathroom;Two level Home Equipment: Walker - 2 wheels;Cane - single point;Shower seat Additional Comments: daughter and granddaughter are RNs, pt lives with dtr who is a Char[employer] fundraiser    Prior Function Level of Independence: Independent         Comments: used cane or RW occasionally. indep with all ADLs, was hunting last month     Hand Dominance   Dominant Hand: Right    Extremity/Trunk Assessment   Upper Extremity Assessment Upper Extremity Assessment: Generalized weakness    Lower Extremity Assessment Lower Extremity Assessment: Generalized weakness    Cervical / Trunk Assessment Cervical / Trunk Assessment: Normal  Communication   Communication:  (intubated via ETT)  Cognition Arousal/Alertness: Awake/alert Behavior During Therapy: WFL for tasks assessed/performed Overall Cognitive Status: Impaired/Different from baseline Area of Impairment: Following commands;Problem solving                       Following Commands: Follows one step commands with increased time;Follows one step commands inconsistently     Problem Solving: Decreased initiation;Slow processing;Difficulty sequencing;Requires verbal cues;Requires tactile cues General Comments: pt very HOH which could contribute to  difficulty following commands, pt shaking head yes/no appropriately      General Comments General comments (skin integrity, edema, etc.): pt with bilat puffy areas on anterolateral neck, son reports this to be baseline, VSS    Exercises     Assessment/Plan    PT Assessment Patient needs continued PT services  PT Problem List Decreased strength;Decreased range of motion;Decreased activity tolerance;Decreased balance;Decreased mobility;Decreased coordination;Decreased cognition       PT Treatment Interventions DME instruction;Gait training;Stair training;Functional mobility training;Therapeutic activities;Therapeutic exercise;Balance training;Neuromuscular re-education    PT Goals (Current goals can be found in the Care Plan section)  Acute Rehab PT Goals Patient Stated Goal: unable to state PT Goal Formulation: With patient/family Time For Goal Achievement: 03/30/20 Potential to Achieve Goals: Good    Frequency Min 3X/week   Barriers to discharge        Co-evaluation               AM-PAC PT "6 Clicks" Mobility  Outcome Measure Help needed turning from your back to your side while in a flat bed without using bedrails?: Total Help needed moving from lying on your back to sitting on the side of a flat bed without using bedrails?: Total Help needed moving to and from a bed to a chair (including a wheelchair)?: Total Help needed standing up from a chair using your arms (e.g., wheelchair or bedside chair)?: Total Help needed to walk in hospital room?: Total Help needed climbing 3-5 steps with a railing? : Total 6 Click Score: 6    End of Session Equipment Utilized During Treatment: Oxygen (via vent) Activity Tolerance: Patient tolerated treatment well Patient left: in bed;with call bell/phone within reach;with bed alarm set;with family/visitor present Nurse Communication: Mobility status PT Visit Diagnosis: Unsteadiness on feet (R26.81);Muscle weakness (generalized)  (M62.81);Difficulty in walking, not elsewhere classified (R26.2)    Time: 3710-6269 PT Time Calculation (min) (ACUTE ONLY): 23 min   Charges:   PT Evaluation $PT Eval Moderate Complexity: 1 Mod PT Treatments $Therapeutic Activity: 8-22 mins        Lewis Shock, PT, DPT Acute Rehabilitation Services Pager #: [phone] Office #: [phone]   Iona Hansen 03/16/2020, 12:48 PM

## 2020-03-16 NOTE — Progress Notes (Signed)
  Echocardiogram 2D Echocardiogram has been performed.  Isaiah [name] 03/16/2020, 9:47 AM

## 2020-03-16 NOTE — Procedures (Signed)
Interventional Radiology Procedure Note  Procedure: Percutaneous cholecystostomy tube placement.   Complications: None  Estimated Blood Loss: None  Recommendations: - Tube to gravity - Flush Q shift - Cultures sent   Signed,  Sterling Big, MD

## 2020-03-16 NOTE — Progress Notes (Signed)
NAME:  Isaiah [name], MRN:  [id number], DOB:  Sep 24, 1940, LOS: 2 ADMISSION DATE:  03/14/2020, CONSULTATION DATE:  03/15/2020 REFERRING MD:  Anesthesia, CHIEF COMPLAINT:  Code  blue  Brief History   79 y.o. M who was admitted for acute cholecystitis and taken to IR for Perc Chole tube placement and coded during the procedure obtain ROSC and transferred to ICU  Past Medical History  Atrial fibrillation (HCC), CAD (coronary artery disease), CHF (congestive heart failure) (HCC), Dementia (HCC), Diabetes mellitus, DVT (deep venous thrombosis) (HCC), Fall at home (10/2015), Hyperlipidemia, Hypertension, Left-sided carotid artery disease (HCC), MRSA infection, Myocardial infarction (HCC), Obesity, Peripheral vascular disease (HCC), Sleep apnea, Stroke (HCC), Stroke (HCC), and Venous insufficiency  Significant Hospital Events   11/1 Admit to hospitalists 11/2 To IR>code blue>PCCM consult  Consults:  PCCM IR Surgery Cardiology Procedures:  11/2 ETT  Significant Diagnostic Tests:  11/1 CT head>> no acute findings, small vessel ischemic changes  11/1 CT abd/pelvis>>Distended gallbladder with layering gallstones and mild pericholecystic stranding, raising concern for acute cholecystitis.  Micro Data:  11/1 Covid and Flu>>negative 11/1 MRSA screen >> negative 11/2 BCx2>> 11/2 UC>> Negative  Antimicrobials:  Cefepime 11/1 Ceftriaxone 11/2 Flagyl 11/1-11/2 Zosyn 11/2-   Interim history/subjective:  Patient with elevated troponin up to 1364 overnight with EKG showing ST depression in V4-V6 and T wave inversions in inferior leads. Started on heparin gtt and aspirin. Noted to be hyperglycemic up to 319.   This morning patient is awake off sedation, follow commands, nods to having abdominal pain.  Objective   Blood pressure (!) 112/56, pulse 70, temperature 99.8 F (37.7 C), temperature source Oral, resp. rate (!) 23, height 5\' 9"  (1.753 m), weight 94.2 kg, SpO2 100 %.    Vent Mode:  PRVC FiO2 (%):  [40 %-100 %] 40 % Set Rate:  [18 bmp] 18 bmp Vt Set:  [560 mL] 560 mL PEEP:  [5 cmH20] 5 cmH20 Plateau Pressure:  [20 cmH20-21 cmH20] 21 cmH20   Intake/Output Summary (Last 24 hours) at 03/16/2020 0731 Last data filed at 03/16/2020 0500 Gross per 24 hour  Intake 5102.02 ml  Output 1600 ml  Net 3502.02 ml   Filed Weights   03/14/20 0810 03/14/20 2205  Weight: 95.3 kg 94.2 kg    Examination: General: Overweight male, intubated, awake  HENT: ETT in place Lungs: clear to ascultation anteriorly  Cardiovascular: Normal S1 and S2, irregular rhythm normal rate Abdomen: Soft, generalized tenderness to palpation, normal bowel sounds Extremities: warm 1+ edema bilaterally Neuro: Pupils equal and reactive to light Skin: bilateral decubitus ulcers of buttocks, clean dressed  Resolved Hospital Problem list     Assessment & Plan:   Neurologic: Acute encephalopathy Patient awake and following commands, able to nod yes and no to questions off sedation does not appear to have anoxic brain injury at this time. - PRN fentanyl/versed for RASS goal of 0  Cardiovascular: NSTEMI Patient with significant cardiac history with CABG in 2018. Elevated troponin overnight up to 1364. EKG with new  ST depression in V4-6 and T wave inversions in inferior leads. Started on heparin drip and ASA 81 mg.  -Cardiology consulted  -ASA 81 mg -Heparin Gtt -Pravastatin 40 mg daily  -TTE pending  Shock septic vs cardiogenic with hypovolemia Septic cholecystitis likely source. Appears euvolemic today. BP improving off levo  - continue Zosyn - hold lasix, hydralazine, and metolazone  - Will discuss with IR timing of percutaneous chole drain placement for source control  PEA  arrest due to hypovolemia, septic shock Arrested after intubation and induction of anesthesia with ketamine and propofol. Noted to have nonsustatained V fib, did not receive defibrillation.  Neurological function appears  intact off sedation. - Amiodarone Gtt -TTE pending   Atrial fibrillation On warfarin and metoprolol at home. Holding as he was on levo and for procedure. Currently rate controlled on amiodarone gtt and on heparin drip. Will consider restarting warfarin and rate control medications after IR procedure.   Respiratory: Acute hypoxic resipiratory failure secondary to sepsis, underlying OSA, and cardiac arrest - Currently on minimal vent settings, likely able to wean - Will continue on vent until after he has had drainage for acute cholecystitis  Gastrointestinal:  Acute cholecystitis  Was unable to place per chole drain as pt arrest after intubation for general anesthesia  - Surgery and IR following, will discuss with IR timing for perc chole drain - continue zoysn - NPO  Renal AKI - Hypokalemia resolved - continue to monitor BMP  Endocrine: Diabetes  Poorly controlled diabetes with A1c of 12.1. Home regimen glipizide 5 mg BID and Novolog 70/30 50 unites twice daily. Noted to have elevated blood glucose of 319 overnight requiring. - start lantus 25 units twice daily - CBG monitoring - SSI  Skin: Bilateral buttock stage II pressure ulcers -continue wound care  Best practice:  Diet: NPO DVT prophylaxis: Heparin GI prophylaxis: PPI Glucose control: SSI Mobility: BR Code Status: Full Family Communication: Discussed plan with son at bedside Disposition: ICU  Labs   CBC: Recent Labs  Lab 03/14/20 0743 03/14/20 0743 03/15/20 0027 03/15/20 1650 03/15/20 1802 03/16/20 0309 03/16/20 0347  WBC 21.2*  --  17.1* 21.3*  --  14.6*  --   NEUTROABS 18.6*  --   --   --   --   --   --   HGB 13.9   < > 12.7* 11.8* 10.9* 11.8* 12.6*  HCT 43.4   < > 39.3 36.8* 32.0* 35.8* 37.0*  MCV 105.3*  --  104.0* 105.1*  --  100.8*  --   PLT 141*  --  127* 142*  --  179  --    < > = values in this interval not displayed.    Basic Metabolic Panel: Recent Labs  Lab 03/14/20 0743  03/14/20 0743 03/15/20 0027 03/15/20 1650 03/15/20 1757 03/15/20 1802 03/16/20 0309 03/16/20 0347  NA 130*   < > 131* 135  --  140 131* 132*  K 3.5   < > 3.0* 3.3*  --  2.6* 4.1 4.2  CL 78*  --  86* 90*  --   --  90*  --   CO2 35*  --  33* 34*  --   --  25  --   GLUCOSE 306*  --  207* 198*  --   --  309*  --   BUN 73*  --  83* 85*  --   --  92*  --   CREATININE 3.45*  --  2.75* 2.53*  --   --  2.47*  --   CALCIUM 10.0  --  9.2 8.4*  --   --  8.3*  --   MG  --   --  2.6*  --  2.5*  --  2.3  --   PHOS  --   --   --   --   --   --  3.7  --    < > = values in this interval  not displayed.   GFR: Estimated Creatinine Clearance: 27.5 mL/min (A) (by C-G formula based on SCr of 2.47 mg/dL (H)). Recent Labs  Lab 03/14/20 0743 03/14/20 0743 03/14/20 0818 03/14/20 2125 03/15/20 0027 03/15/20 1650 03/15/20 1757 03/15/20 2231 03/16/20 0309  PROCALCITON  --   --  7.85  --   --   --   --   --   --   WBC 21.2*  --   --   --  17.1* 21.3*  --   --  14.6*  LATICACIDVEN 2.2*   < >  --  1.2 0.9  --  2.6* 2.8*  --    < > = values in this interval not displayed.    Liver Function Tests: Recent Labs  Lab 03/14/20 0743  AST 24  ALT 13  ALKPHOS 82  BILITOT 1.1  PROT 7.8  ALBUMIN 2.8*   Recent Labs  Lab 03/14/20 0818  LIPASE 35   Recent Labs  Lab 03/14/20 0744  AMMONIA 17    ABG    Component Value Date/Time   PHART 7.562 (H) 03/16/2020 0347   PCO2ART 36.4 03/16/2020 0347   PO2ART 189 (H) 03/16/2020 0347   HCO3 32.6 (H) 03/16/2020 0347   TCO2 34 (H) 03/16/2020 0347   O2SAT 100.0 03/16/2020 0347     Coagulation Profile: Recent Labs  Lab 03/14/20 0911 03/15/20 0643 03/15/20 1757  INR 2.7* 1.5* 1.5*    Cardiac Enzymes: Results for GRAYLIN, SPERLING (MRN [id number]) as of 03/16/2020 07:09  Ref. Range 03/15/2020 17:05 03/15/2020 19:16 03/16/2020 00:08 03/16/2020 03:09  Troponin I (High Sensitivity) Latest Ref Range: <18 ng/L 61 (H) 308 (HH) 982 (HH) 1,364 (HH)     HbA1C: Hgb A1c MFr Bld  Date/Time Value Ref Range Status  03/14/2020 09:25 PM 12.1 (H) 4.8 - 5.6 % Final    Comment:    (NOTE) Pre diabetes:          5.7%-6.4%  Diabetes:              >6.4%  Glycemic control for   <7.0% adults with diabetes   12/12/2018 09:01 PM 10.0 (H) 4.8 - 5.6 % Final    Comment:    (NOTE) Pre diabetes:          5.7%-6.4% Diabetes:              >6.4% Glycemic control for   <7.0% adults with diabetes     CBG: Recent Labs  Lab 03/15/20 0729 03/15/20 1148 03/15/20 1951 03/15/20 2324 03/16/20 0337  GLUCAP 263* 250* 233* 319* 281*   Past Medical History  He,  has a past medical history of Atrial fibrillation (HCC), CAD (coronary artery disease), CHF (congestive heart failure) (HCC), Dementia (HCC), Diabetes mellitus, DVT (deep venous thrombosis) (HCC), Fall at home (10/2015), Hyperlipidemia, Hypertension, Left-sided carotid artery disease (HCC), MRSA infection, Myocardial infarction (HCC), Obesity, Peripheral vascular disease (HCC), Sleep apnea, Stroke (HCC), Stroke (HCC), and Venous insufficiency.   Surgical History    Past Surgical History:  Procedure Laterality Date  . CORONARY ARTERY BYPASS GRAFT  1997   vein harvest from right leg  . CORONARY STENT INTERVENTION N/A 08/07/2016   Procedure: Coronary Stent Intervention;  Surgeon: Corky Crafts, MD;  Location: [facility] INVASIVE CV LAB;  Service: Cardiovascular;  Laterality: N/A;  RCA  . ESOPHAGOGASTRODUODENOSCOPY N/A 08/12/2016   Procedure: ESOPHAGOGASTRODUODENOSCOPY (EGD);  Surgeon: Carman Ching, MD;  Location: [facility] ENDOSCOPY;  Service: Endoscopy;  Laterality: N/A;  . INGUINAL HERNIA  REPAIR  1980's   Bilateral,  done in [place]  . LEFT HEART CATH AND CORS/GRAFTS ANGIOGRAPHY N/A 08/07/2016   Procedure: Left Heart Cath and Cors/Grafts Angiography;  Surgeon: Corky Crafts, MD;  Location: [facility] INVASIVE CV LAB;  Service: Cardiovascular;  Laterality: N/A;  . PR VEIN BYPASS GRAFT,AORTO-FEM-POP   1980   FEM-FEM BPG by [name]     Social History   reports that he quit smoking about 41 years ago. His smoking use included cigarettes. He has never used smokeless tobacco. He reports that he does not drink alcohol and does not use drugs.   Family History   His family history includes Cancer in his father; Diabetes in his daughter, father, and sister; Heart attack in his brother and father; Heart disease in his father, mother, and sister; Hyperlipidemia in his brother, daughter, father, mother, and sister; Hypertension in his brother, daughter, father, mother, sister, and son; Other in his brother, daughter, father, mother, and sister; Varicose Veins in his daughter, mother, and sister.   Allergies No Known Allergies   Home Medications  Prior to Admission medications   Medication Sig Start Date End Date Taking? Authorizing Provider  acetaminophen (TYLENOL) 500 MG tablet Take 500-1,000 mg by mouth every 6 (six) hours as needed for mild pain or headache.   Yes [provider]  allopurinol (ZYLOPRIM) 100 MG tablet TAKE 2 TABLETS BY MOUTH DAILY. Patient taking differently: Take 100 mg by mouth 2 (two) times daily.  05/03/14  Yes Runell Gess, MD  colchicine 0.6 MG tablet Take 0.6 mg by mouth daily as needed (gout attacks).   Yes [provider]  ferrous sulfate 325 (65 FE) MG tablet Take 1 tablet (325 mg total) by mouth 2 (two) times daily with a meal. Patient taking differently: Take 325 mg by mouth daily with breakfast.  08/09/16  Yes Ghimire, Werner Lean, MD  furosemide (LASIX) 40 MG tablet Take 1 tablet (40 mg total) by mouth 2 (two) times daily. Patient taking differently: Take 40 mg by mouth in the morning.  08/24/16  Yes Osvaldo Shipper, MD  gabapentin (NEURONTIN) 300 MG capsule Take 300-600 mg by mouth See admin instructions. Take 300 mg by mouth three times a day and 600 mg at bedtime   Yes [provider]  glipiZIDE (GLUCOTROL) 5 MG tablet Take 5 mg by  mouth 2 (two) times daily before a meal.   Yes [provider]  hydrALAZINE (APRESOLINE) 100 MG tablet Take 100 mg by mouth 2 (two) times daily.   Yes [provider]  insulin aspart protamine- aspart (NOVOLOG MIX 70/30) (70-30) 100 UNIT/ML injection Inject 50 Units into the skin 2 (two) times daily before a meal.    Yes [provider]  isosorbide mononitrate (IMDUR) 60 MG 24 hr tablet Take 1 tablet (60 mg total) by mouth daily. Appointment needed for future refills Patient taking differently: Take 60 mg by mouth daily.  05/03/15  Yes Runell Gess, MD  metolazone (ZAROXOLYN) 2.5 MG tablet Take 2.5 mg by mouth daily as needed (AS DIRECTED FOR MARKED SWELLING).   Yes [provider]  Multiple Vitamin (MULTIVITAMIN WITH MINERALS) TABS tablet Take 1 tablet by mouth daily.   Yes [provider]  nitroGLYCERIN (NITROSTAT) 0.4 MG SL tablet Place 0.4 mg under the tongue every 5 (five) minutes as needed for chest pain.   Yes [provider]  pantoprazole (PROTONIX) 40 MG tablet Take 40 mg by mouth 2 (two)  times daily before a meal.    Yes [provider]  potassium chloride (K-DUR) 10 MEQ tablet Take 1 tablet (10 mEq total) by mouth daily. 08/31/16  Yes Barrett, Joline Salt, PA-C  pravastatin (PRAVACHOL) 40 MG tablet Take 40 mg by mouth at bedtime.    Yes [provider]  PRESCRIPTION MEDICATION Inhale into the lungs See admin instructions. CPAP- At bedtime and during any time of rest   Yes [provider]  Psyllium (METAMUCIL PO) Take 2 capsules by mouth at bedtime.   Yes [provider]  QUEtiapine (SEROQUEL) 50 MG tablet Take 50 mg by mouth at bedtime.    Yes [provider]  warfarin (COUMADIN) 1 MG tablet Take 1 mg by mouth daily as needed (in conjunction with the 5 mg strength, AS DIRECTED- depending on INR).  12/18/19  Yes [provider]  warfarin (COUMADIN) 5 MG tablet Take 5 mg by mouth daily  with supper. 01/28/20  Yes [provider]  docusate sodium (COLACE) 100 MG capsule Take 1 capsule (100 mg total) by mouth 2 (two) times daily. Patient not taking: Reported on 03/14/2020 12/16/18   Jadene Pierini, MD  oxyCODONE (OXY IR/ROXICODONE) 5 MG immediate release tablet Take 1 tablet (5 mg total) by mouth every 4 (four) hours as needed (pain). Patient not taking: Reported on 03/14/2020 12/16/18   Jadene Pierini, MD     Quincy Simmonds, MD PGY 1 [facility]  03/16/20 7:04 AM

## 2020-03-16 NOTE — Progress Notes (Signed)
Initial Nutrition Assessment  DOCUMENTATION CODES:   Not applicable  INTERVENTION:   Tube feeding recommendations: - Vital AF 1.2 @ 60 ml/hr (1440 ml/day) - ProSource TF 45 ml TID  Recommended tube feeding regimen would provide 1848 kcal, 141 grams of protein, and 1168 ml of H2O.   NUTRITION DIAGNOSIS:   Increased nutrient needs related to wound healing as evidenced by estimated needs.  GOAL:   Patient will meet greater than or equal to 90% of their needs  MONITOR:   Vent status, Labs, Weight trends, Skin, I & O's  REASON FOR ASSESSMENT:   Ventilator    ASSESSMENT:   79 year old male who presented to the ED on 11/10 with generalized weakness. PMH of CAD, CHF, atrial fibrillation, DVT, HTN, HLD, PVD, CVA, thoracic back surgery last year, pressure injury. Pt admitted with sepsis due to acute cholecystitis.   11/02 - brief cardiac arrest in IR, intubated  Discussed pt with RN and during ICU rounds. Awaiting input from IR regarding perc chole drain placement. Per CCM, hopeful to extubate pt after drain placement. No plans to start TF today. RD will leave TF recommendations.  OG tube in place, currently to low intermittent suction.  Spoke with pt's son at bedside who reports pt typically has a great appetite and eats "whatever he wants to." Pt's son denies noticing any recent weight changes in pt. Weight history in chart is limited. Last available weight is from 12/13/19 and is 5 kg above pt's current weight.  Patient is currently intubated on ventilator support MV: 10.0 L/min Temp (24hrs), Avg:99.2 F (37.3 C), Min:98.2 F (36.8 C), Max:100.3 F (37.9 C) BP (a-line): 99/44 MAP (a-line): 59  Drips: Amiodarone Fentanyl Heparin NS: 10 ml/hr Levophed: off this AM  Medications reviewed and include: colace, SSI q 4 hours, IV protonix, miralax, IV abx  Labs reviewed: sodium 132, BUN 92, creatinine 2/47, ionized calcium 1.05 CBG's: 233-319 x 24 hours  UOP: 1000 ml x  24 hours OG tube output: 600 ml x 24 hours I/O's: +4.3 L since admit  NUTRITION - FOCUSED PHYSICAL EXAM:    Most Recent Value  Orbital Region No depletion  Upper Arm Region No depletion  Thoracic and Lumbar Region No depletion  Buccal Region Unable to assess  Temple Region Mild depletion  Clavicle Bone Region No depletion  Clavicle and Acromion Bone Region Mild depletion  Scapular Bone Region Unable to assess  Dorsal Hand No depletion  Patellar Region No depletion  Anterior Thigh Region No depletion  Posterior Calf Region No depletion  Edema (RD Assessment) None  Hair Reviewed  Eyes Reviewed  Mouth Unable to assess  Skin Reviewed  Nails Reviewed       Diet Order:   Diet Order            Diet NPO time specified  Diet effective now                 EDUCATION NEEDS:   No education needs have been identified at this time  Skin:  Skin Assessment: Skin Integrity Issues: Stage II: bilateral buttocks  Last BM:  no documented BM  Height:   Ht Readings from Last 1 Encounters:  03/15/20 5\' 9"  (1.753 m)    Weight:   Wt Readings from Last 1 Encounters:  03/14/20 94.2 kg    Ideal Body Weight:  72.7 kg  BMI:  Body mass index is 30.67 kg/m.  Estimated Nutritional Needs:   Kcal:  1914  Protein:  130-150 grams  Fluid:  >/= 2.0 L    Earma Reading, MS, RD, LDN Inpatient Clinical Dietitian Please see AMi[facility] for contact information.

## 2020-03-16 NOTE — Anesthesia Postprocedure Evaluation (Signed)
Anesthesia Post Note  Patient: Isaiah [name]  Procedure(s) Performed: IR WITH ANESTHESIA (N/A )     Patient location during evaluation: ICU Anesthesia Type: General Level of consciousness: sedated and patient remains intubated per anesthesia plan Pain management: pain level controlled Vital Signs Assessment: post-procedure vital signs reviewed and stable Respiratory status: patient remains intubated per anesthesia plan and patient on ventilator - see flowsheet for VS Cardiovascular status: stable   No complications documented.  Last Vitals:  Vitals:   03/16/20 1030 03/16/20 1126  BP:    Pulse: 67 63  Resp: 18 18  Temp:    SpO2: 100% 100%    Last Pain:  Vitals:   03/16/20 0754  TempSrc: Axillary  PainSc:                  Mellody Dance

## 2020-03-16 NOTE — Consult Note (Addendum)
Cardiology Consultation:   Patient ID: Isaiah [name]; [id number]; 1941-02-17   Admit date: 03/14/2020 Date of Consult: 03/16/2020  Primary Care Provider: [name], [name], MD Primary Cardiologist: Gwenlyn Found Primary Electrophysiologist:  none   Patient Profile:   Isaiah [name] is a 79 y.o. male with a hx of CAD s/p CABG (remote) and PCI with stent 2019, DM, atrial fibrillation (warfarin), CHF, DVT, CVA, Carotid artery disease(left), OSA (CPAP) who is being seen today for the evaluation of decompensated HFrEF in the setting of NSTEMI at the request of Dr. Shearon Stalls.  History of Present Illness:   Mr. Hinderliter presented acute onset RUQ abdominal pain with associated fever and altered mental status concerning for acute cholangitis, but there was not evidence cholestasis on physcial exam or complete metabolic panel. However, CT significant was significant for acute cholecystitis with distended gallbladder and pericholecystic stranding. IR was consulted for percutaneous cholecystostomy. During the procedure, the patient rapidly decline with reported PEA arrest thought to be secondary to anesthesia induction and possible septic shock . Patient subsequently developed ventricular fibrillation that apparently self terminates as shocks were not reported.  Patient was then transferred to the ICU on vasopressor support where he was found to have EKG findings consistent with ischemia with associated elevation in his troponins. Patient was subsequently started on heparin for anticoagulation in the setting of NSTEMI.   Past Medical History:  Diagnosis Date  . Atrial fibrillation ([place])    on Coumadin  . CAD (coronary artery disease)    s/p remote CABG, stent in 2019  . CHF (congestive heart failure) ([place])   . Dementia ([place])   . Diabetes mellitus   . DVT (deep venous thrombosis) ([place])   . Fall at home 10/2015  . Hyperlipidemia   . Hypertension   . Left-sided carotid artery disease ([place])   . MRSA infection   .  Myocardial infarction ([place])   . Obesity   . Peripheral vascular disease ([place])   . Sleep apnea   . Stroke ([place])   . Stroke ([place])   . Venous insufficiency     Past Surgical History:  Procedure Laterality Date  . CORONARY ARTERY BYPASS GRAFT  1997   vein harvest from right leg  . CORONARY STENT INTERVENTION N/A 08/07/2016   Procedure: Coronary Stent Intervention;  Surgeon: Jettie Booze, MD;  Location: [facility];  Service: Cardiovascular;  Laterality: N/A;  RCA  . ESOPHAGOGASTRODUODENOSCOPY N/A 08/12/2016   Procedure: ESOPHAGOGASTRODUODENOSCOPY (EGD);  Surgeon: Laurence Spates, MD;  Location: [facility];  Service: Endoscopy;  Laterality: N/A;  . INGUINAL HERNIA REPAIR  1980's   Bilateral,  done in [place]  . LEFT HEART CATH AND CORS/GRAFTS ANGIOGRAPHY N/A 08/07/2016   Procedure: Left Heart Cath and Cors/Grafts Angiography;  Surgeon: Jettie Booze, MD;  Location: [facility];  Service: Cardiovascular;  Laterality: N/A;  . PR VEIN BYPASS GRAFT,AORTO-FEM-POP  1980   FEM-FEM BPG by Dr. Deon Pilling  . RADIOLOGY WITH ANESTHESIA N/A 03/15/2020   Procedure: IR WITH ANESTHESIA;  Surgeon: Aletta Edouard, MD;  Location: [facility];  Service: Radiology;  Laterality: N/A;     Home Medications:  Prior to Admission medications   Medication Sig Start Date End Date Taking? Authorizing Provider  acetaminophen (TYLENOL) 500 MG tablet Take 500-1,000 mg by mouth every 6 (six) hours as needed for mild pain or headache.   Yes [provider]  allopurinol (ZYLOPRIM) 100 MG tablet TAKE 2 TABLETS BY MOUTH DAILY. Patient taking  differently: Take 100 mg by mouth 2 (two) times daily.  05/03/14  Yes Lorretta Harp, MD  colchicine 0.6 MG tablet Take 0.6 mg by mouth daily as needed (gout attacks).   Yes [provider]  ferrous sulfate 325 (65 FE) MG tablet Take 1 tablet (325 mg total) by mouth 2 (two) times daily with a meal. Patient taking differently: Take 325 mg by mouth  daily with breakfast.  08/09/16  Yes Ghimire, Henreitta Leber, MD  furosemide (LASIX) 40 MG tablet Take 1 tablet (40 mg total) by mouth 2 (two) times daily. Patient taking differently: Take 40 mg by mouth in the morning.  08/24/16  Yes Bonnielee Haff, MD  gabapentin (NEURONTIN) 300 MG capsule Take 300-600 mg by mouth See admin instructions. Take 300 mg by mouth three times a day and 600 mg at bedtime   Yes [provider]  glipiZIDE (GLUCOTROL) 5 MG tablet Take 5 mg by mouth 2 (two) times daily before a meal.   Yes [provider]  hydrALAZINE (APRESOLINE) 100 MG tablet Take 100 mg by mouth 2 (two) times daily.   Yes [provider]  insulin aspart protamine- aspart (NOVOLOG MIX 70/30) (70-30) 100 UNIT/ML injection Inject 50 Units into the skin 2 (two) times daily before a meal.    Yes [provider]  isosorbide mononitrate (IMDUR) 60 MG 24 hr tablet Take 1 tablet (60 mg total) by mouth daily. Appointment needed for future refills Patient taking differently: Take 60 mg by mouth daily.  05/03/15  Yes Lorretta Harp, MD  metolazone (ZAROXOLYN) 2.5 MG tablet Take 2.5 mg by mouth daily as needed (AS DIRECTED FOR MARKED SWELLING).   Yes [provider]  Multiple Vitamin (MULTIVITAMIN WITH MINERALS) TABS tablet Take 1 tablet by mouth daily.   Yes [provider]  nitroGLYCERIN (NITROSTAT) 0.4 MG SL tablet Place 0.4 mg under the tongue every 5 (five) minutes as needed for chest pain.   Yes [provider]  pantoprazole (PROTONIX) 40 MG tablet Take 40 mg by mouth 2 (two) times daily before a meal.    Yes [provider]  potassium chloride (K-DUR) 10 MEQ tablet Take 1 tablet (10 mEq total) by mouth daily. 08/31/16  Yes Barrett, Evelene Croon, PA-C  pravastatin (PRAVACHOL) 40 MG tablet Take 40 mg by mouth at bedtime.    Yes [provider]  PRESCRIPTION MEDICATION Inhale into the lungs See admin instructions. CPAP- At bedtime and during  any time of rest   Yes [provider]  Psyllium (METAMUCIL PO) Take 2 capsules by mouth at bedtime.   Yes [provider]  QUEtiapine (SEROQUEL) 50 MG tablet Take 50 mg by mouth at bedtime.    Yes [provider]  warfarin (COUMADIN) 1 MG tablet Take 1 mg by mouth daily as needed (in conjunction with the 5 mg strength, AS DIRECTED- depending on INR).  12/18/19  Yes [provider]  warfarin (COUMADIN) 5 MG tablet Take 5 mg by mouth daily with supper. 01/28/20  Yes [provider]  docusate sodium (COLACE) 100 MG capsule Take 1 capsule (100 mg total) by mouth 2 (two) times daily. Patient not taking: Reported on 03/14/2020 12/16/18   Judith Part, MD  oxyCODONE (OXY IR/ROXICODONE) 5 MG immediate release tablet Take 1 tablet (5 mg total) by mouth every 4 (four) hours as needed (pain). Patient not taking: Reported on 03/14/2020 12/16/18   Judith Part, MD    Inpatient Medications:  Scheduled Meds: . acetaminophen  650 mg Per Tube Q4H   Or  . acetaminophen (TYLENOL) oral liquid 160 mg/5 mL  650 mg Per Tube Q4H   Or  . acetaminophen  650 mg Rectal Q4H  . allopurinol  100 mg Per Tube BID  . aspirin  81 mg Per Tube Daily  . chlorhexidine gluconate (MEDLINE KIT)  15 mL Mouth Rinse BID  . Chlorhexidine Gluconate Cloth  6 each Topical Daily  . docusate  100 mg Per Tube BID  . gabapentin  300 mg Per Tube Q12H  . insulin aspart  0-20 Units Subcutaneous Q4H  . insulin glargine  25 Units Subcutaneous BID  . mouth rinse  15 mL Mouth Rinse 10 times per day  . pantoprazole (PROTONIX) IV  40 mg Intravenous Q24H  . polyethylene glycol  17 g Per Tube Daily  . pravastatin  40 mg Per Tube QHS  . QUEtiapine  50 mg Per Tube Q supper  . sodium chloride flush  3 mL Intravenous Q12H   Continuous Infusions: . sodium chloride 10 mL/hr at 03/16/20 1000  . amiodarone 30 mg/hr (03/16/20 1116)  . fentaNYL infusion INTRAVENOUS 25 mcg/hr (03/16/20 1000)  . heparin  1,400 Units/hr (03/16/20 1000)  . norepinephrine (LEVOPHED) Adult infusion Stopped (03/16/20 0955)  . piperacillin-tazobactam (ZOSYN)  IV Stopped (03/16/20 0925)   PRN Meds: fentaNYL (SUBLIMAZE) injection, fentaNYL (SUBLIMAZE) injection, iohexol, midazolam, midazolam, ondansetron **OR** ondansetron (ZOFRAN) IV  Allergies:   No Known Allergies  Social History:   Social History   Socioeconomic History  . Marital status: Divorced    Spouse name: Not on file  . Number of children: Not on file  . Years of education: Not on file  . Highest education level: Not on file  Occupational History  . Not on file  Tobacco Use  . Smoking status: Former Smoker    Types: Cigarettes    Quit date: 05/14/1978    Years since quitting: 41.8  . Smokeless tobacco: Never Used  Substance and Sexual Activity  . Alcohol use: No  . Drug use: No  . Sexual activity: Not on file  Other Topics Concern  . Not on file  Social History Narrative  . Not on file   Social Determinants of Health   Financial Resource Strain:   . Difficulty of Paying Living Expenses: Not on file  Food Insecurity:   . Worried About Charity fundraiser in the Last Year: Not on file  . Ran Out of Food in the Last Year: Not on file  Transportation Needs:   . Lack of Transportation (Medical): Not on file  . Lack of Transportation (Non-Medical): Not on file  Physical Activity:   . Days of Exercise per Week: Not on file  . Minutes of Exercise per Session: Not on file  Stress:   . Feeling of Stress : Not on file  Social Connections:   . Frequency of Communication with Friends and Family: Not on file  . Frequency of Social Gatherings with Friends and Family: Not on file  . Attends Religious Services: Not on file  . Active Member of Clubs or Organizations: Not on file  . Attends Archivist Meetings: Not on file  . Marital Status: Not on file  Intimate Partner Violence:   . Fear of Current or Ex-Partner: Not on file  .  Emotionally Abused: Not on file  . Physically Abused: Not on file  . Sexually Abused: Not on file  Family History:   Family History  Problem Relation Age of Onset  . Other Mother        varicose veins  . Heart disease Mother   . Hyperlipidemia Mother   . Hypertension Mother   . Varicose Veins Mother   . Cancer Father   . Diabetes Father   . Heart disease Father        before age 32  . Hyperlipidemia Father   . Hypertension Father   . Other Father        varicose veins  . Heart attack Father   . Diabetes Daughter   . Hyperlipidemia Daughter   . Hypertension Daughter   . Other Daughter        varicose veins  . Varicose Veins Daughter   . Hypertension Son   . Diabetes Sister   . Heart disease Sister        DVT  . Other Sister        varicose veins  . Hyperlipidemia Sister   . Hypertension Sister   . Varicose Veins Sister   . Other Brother        varicose veins  . Hyperlipidemia Brother   . Hypertension Brother   . Heart attack Brother      ROS:  Please see the history of present illness.  ROS  All other ROS reviewed and negative.     Physical Exam/Data:   Vitals:   03/16/20 1015 03/16/20 1030 03/16/20 1126 03/16/20 1232  BP: (!) 125/113     Pulse: (!) 59 67 63   Resp: _0 Temp:    98.9 F (37.2 C)  TempSrc:    Axillary  SpO2: 97% 100% 100%   Weight:      Height:        Intake/Output Summary (Last 24 hours) at 03/16/2020 1302 Last data filed at 03/16/2020 1003 Gross per 24 hour  Intake 5831.77 ml  Output 1600 ml  Net 4231.77 ml   Filed Weights   03/14/20 0810 03/14/20 2205  Weight: 95.3 kg 94.2 kg   Body mass index is 30.67 kg/m.  General:  Patient resting in bed with ET tube in place and able to follow cammnads HEENT: clear oropharyngeal mucous menrane Lymph: no adenopathy Neck: no JVD Endocrine:  No thryomegaly Vascular: faint carotid appreciated bilaterally. 2+ pulse palpable in upper and lower extremities Cardiac:  Heart sounds  were difficult to appreciate due to mechanical ventilation  Lungs:  clear to auscultation bilaterally, no wheezing, rhonchi or rales  Abd: RUQ tenderness to palpation  Ext: no edema Musculoskeletal: generalized weakness.  Skin: warm and dry Neuro:  CNs 2-12 grossly intact, no focal abnormalities noted Psych:  Unable to be evaluated due to ET placment  EKG:  The EKG was personally reviewed and demonstrates:  Atrial fibrillation with new T-wave inversion in the inferior leads.  Telemetry:  Telemetry was personally reviewed and demonstrates:  Atrial fibrillation   Relevant CV Studies:  Left Heart Cath: (2018)  Ost Cx to Prox Cx lesion, 100 %stenosed. SVG to OM patent, with Y graft SVG to diagonal originating from mid portion of the OM graft. Moderate disease in the origin of the Y graft portion to the diagonal.  Mid LAD lesion, 70 %stenosed. Dist LAD lesion, 100 %stenosed. LIMA to LAD is widely patent.  Ost RCA to Prox RCA lesion, 80 %stenosed. A STENT XIENCE ALPINE RX 4.0X23 drug eluting stent was successfully placed.  Post intervention, there  is a 0% residual stenosis.  LV end diastolic pressure is moderately elevated.  There is no aortic valve stenosis.  Echocardiogram (03/16/2020) 1. Severe biventricular dysfunction with at least mild-moderate secondary  MR and TR.  2. Left ventricular ejection fraction, by estimation, is 20%. The left  ventricle has severely decreased function. The left ventricle demonstrates  global hypokinesis. There is mild left ventricular hypertrophy. Left  ventricular diastolic parameters are  indeterminate. There is the interventricular septum is flattened in  systole and diastole, consistent with right ventricular pressure and  volume overload.  3. Right ventricular systolic function is severely reduced. The right  ventricular size is severely enlarged.  4. Left atrial size was mild to moderately dilated.  5. Right atrial size was severely  dilated.  6. The mitral valve is normal in structure, and demonstrates tenting due  to LV dysfunction. Mild to moderate mitral valve regurgitation. No  evidence of mitral stenosis.  7. Tricuspid valve regurgitation is mild to moderate.  8. The aortic valve is grossly normal. There is mild calcification of the  aortic valve. Aortic valve regurgitation is trivial. No aortic stenosis is  present.   Conclusion(s)/Recommendation(s): No definite left ventricular mural or  apical thrombus/thrombi. Swirling apical contrast.  Laboratory Data:  Chemistry Recent Labs  Lab 03/15/20 0027 03/15/20 0027 03/15/20 1650 03/15/20 1650 03/15/20 1802 03/16/20 0309 03/16/20 0347  NA 131*   < > 135   < > 140 131* 132*  K 3.0*   < > 3.3*   < > 2.6* 4.1 4.2  CL 86*  --  90*  --   --  90*  --   CO2 33*  --  34*  --   --  25  --   GLUCOSE 207*  --  198*  --   --  309*  --   BUN 83*  --  85*  --   --  92*  --   CREATININE 2.75*  --  2.53*  --   --  2.47*  --   CALCIUM 9.2  --  8.4*  --   --  8.3*  --   GFRNONAA 23*  --  25*  --   --  26*  --   ANIONGAP 12  --  11  --   --  16*  --    < > = values in this interval not displayed.    Recent Labs  Lab 03/14/20 0743  PROT 7.8  ALBUMIN 2.8*  AST 24  ALT 13  ALKPHOS 82  BILITOT 1.1   Hematology Recent Labs  Lab 03/15/20 0027 03/15/20 0027 03/15/20 1650 03/15/20 1650 03/15/20 1802 03/16/20 0309 03/16/20 0347  WBC 17.1*  --  21.3*  --   --  14.6*  --   RBC 3.78*  --  3.50*  --   --  3.55*  --   HGB 12.7*   < > 11.8*   < > 10.9* 11.8* 12.6*  HCT 39.3   < > 36.8*   < > 32.0* 35.8* 37.0*  MCV 104.0*  --  105.1*  --   --  100.8*  --   MCH 33.6  --  33.7  --   --  33.2  --   MCHC 32.3  --  32.1  --   --  33.0  --   RDW 13.8  --  13.7  --   --  13.6  --   PLT 127*  --  142*  --   --  179  --    < > = values in this interval not displayed.   Cardiac EnzymesNo results for input(s): TROPONINI in the last 168 hours. No results for input(s):  TROPIPOC in the last 168 hours.  BNP Recent Labs  Lab 03/14/20 0752  BNP 532.7*    DDimer No results for input(s): DDIMER in the last 168 hours.  Radiology/Studies:  CT ABDOMEN PELVIS WO CONTRAST  Result Date: 03/14/2020 CLINICAL DATA:  Right upper quadrant abdominal pain EXAM: CT ABDOMEN AND PELVIS WITHOUT CONTRAST TECHNIQUE: Multidetector CT imaging of the abdomen and pelvis was performed following the standard protocol without IV contrast. COMPARISON:  12/08/2018 FINDINGS: Motion degraded images. Lower chest: Trace bilateral pleural effusions. Associated lower lobe opacities, likely atelectasis. Hepatobiliary: Cirrhosis.  No focal hepatic lesion is seen. Distended gallbladder with layering gallstones (series 3/image 36). Mild pericholecystic stranding (series 3/image 40), raising concern for acute cholecystitis. No intrahepatic or extrahepatic ductal dilatation. Pancreas: Within normal limits. Spleen: Within normal limits. Adrenals/Urinary Tract: Adrenal glands are within normal limits. 2.3 cm medial right lower pole renal cyst (series 3/image 45). Bilateral kidneys are otherwise unremarkable, noting renal vascular calcifications. No hydronephrosis. Mildly thick-walled bladder, although underdistended. Stomach/Bowel: Stomach is within normal limits. No evidence of bowel obstruction. Appendix is not discretely visualized. Scattered sigmoid diverticulosis, without evidence of diverticulitis. Vascular/Lymphatic: No evidence of abdominal aortic aneurysm. Extensive atherosclerotic calcifications of the abdominal aorta and branch vessels. No suspicious abdominopelvic lymphadenopathy. Reproductive: Prostate is unremarkable. Other: No abdominopelvic ascites. Musculoskeletal: Degenerative changes of the visualized thoracolumbar spine. Posterior thoracic fixation ending at L1, incompletely visualized. Median sternotomy. IMPRESSION: Motion degraded images. Distended gallbladder with layering gallstones and mild  pericholecystic stranding, raising concern for acute cholecystitis. Additional ancillary findings as above. Electronically Signed   By: Julian Hy M.D.   On: 03/14/2020 09:50   CT Head Wo Contrast  Result Date: 03/14/2020 CLINICAL DATA:  Altered mental status/delirium EXAM: CT HEAD WITHOUT CONTRAST TECHNIQUE: Contiguous axial images were obtained from the base of the skull through the vertex without intravenous contrast. COMPARISON:  08/11/2016 FINDINGS: Brain: No evidence of acute infarction, hemorrhage, hydrocephalus, extra-axial collection or mass lesion/mass effect. Global cortical atrophy. Subcortical white matter and periventricular small vessel ischemic changes. Vascular: Intracranial atherosclerosis. Skull: Normal. Negative for fracture or focal lesion. Sinuses/Orbits: The visualized paranasal sinuses are essentially clear. The mastoid air cells are unopacified. Other: None. IMPRESSION: No evidence of acute intracranial abnormality. Atrophy with small vessel ischemic changes. Electronically Signed   By: Julian Hy M.D.   On: 03/14/2020 09:36   DG CHEST PORT 1 VIEW  Result Date: 03/15/2020 CLINICAL DATA:  Endotracheal tube placement EXAM: PORTABLE CHEST 1 VIEW COMPARISON:  03/14/2020 FINDINGS: Cardiac shadow is enlarged but stable. Postsurgical changes are again seen. Endotracheal tube is noted in satisfactory position 3.5 cm above the carina. Gastric catheter extends into the stomach. Lungs are hypoinflated with minimal left basilar atelectasis. Mild vascular congestion is noted. No bony abnormality is seen. IMPRESSION: Endotracheal tube and gastric catheter in satisfactory position. Mild vascular congestion. Mild left basilar atelectasis new from the prior exam. Electronically Signed   By: Inez Catalina M.D.   On: 03/15/2020 18:39   DG Chest Portable 1 View  Result Date: 03/14/2020 CLINICAL DATA:  Shortness of breath and fatigue EXAM: PORTABLE CHEST 1 VIEW COMPARISON:  12/08/2018  FINDINGS: Signs of previous CABG procedure. Aortic atherosclerotic calcifications. Heart size normal. Small pleural effusions are suspected with blunting of the costophrenic angles. Pulmonary vascular congestion. IMPRESSION: Suspect  small bilateral pleural effusions and pulmonary vascular congestion. No signs of pneumonia. Electronically Signed   By: Kerby Moors M.D.   On: 03/14/2020 07:02   ECHOCARDIOGRAM COMPLETE  Result Date: 03/16/2020    ECHOCARDIOGRAM REPORT   Patient Name:   Isaiah [name] [facility] Date of Exam: 03/16/2020 Medical Rec #:  [id number]      Height:       69.0 in Accession #:    [id number]     Weight:       207.7 lb Date of Birth:  Jun 12, 1940      BSA:          2.099 m Patient Age:    42 years       BP:           114/64 mmHg Patient Gender: M              HR:           62 bpm. Exam Location:  Inpatient Procedure: 2D Echo, Cardiac Doppler, Color Doppler and Intracardiac            Opacification Agent Indications:    Cardiac arrest  History:        Patient has prior history of Echocardiogram examinations, most                 recent 08/09/2016. CHF, CAD, Arrythmias:Atrial Fibrillation; Risk                 Factors:Hypertension and Dyslipidemia. H/O DVT and CVA.  Sonographer:    Clayton Lefort RDCS (AE) Referring Phys: [id number] Candee Furbish  Sonographer Comments: Technically difficult study due to poor echo windows. IMPRESSIONS  1. Severe biventricular dysfunction with at least mild-moderate secondary MR and TR.  2. Left ventricular ejection fraction, by estimation, is 20%. The left ventricle has severely decreased function. The left ventricle demonstrates global hypokinesis. There is mild left ventricular hypertrophy. Left ventricular diastolic parameters are indeterminate. There is the interventricular septum is flattened in systole and diastole, consistent with right ventricular pressure and volume overload.  3. Right ventricular systolic function is severely reduced. The right ventricular size is  severely enlarged.  4. Left atrial size was mild to moderately dilated.  5. Right atrial size was severely dilated.  6. The mitral valve is normal in structure, and demonstrates tenting due to LV dysfunction. Mild to moderate mitral valve regurgitation. No evidence of mitral stenosis.  7. Tricuspid valve regurgitation is mild to moderate.  8. The aortic valve is grossly normal. There is mild calcification of the aortic valve. Aortic valve regurgitation is trivial. No aortic stenosis is present. Conclusion(s)/Recommendation(s): No definite left ventricular mural or apical thrombus/thrombi. Swirling apical contrast. FINDINGS  Left Ventricle: Left ventricular ejection fraction, by estimation, is 20%. The left ventricle has severely decreased function. The left ventricle demonstrates global hypokinesis. Definity contrast agent was given IV to delineate the left ventricular endocardial borders. The left ventricular internal cavity size was normal in size. There is mild left ventricular hypertrophy. The interventricular septum is flattened in systole and diastole, consistent with right ventricular pressure and volume overload. Left ventricular diastolic parameters are indeterminate. Right Ventricle: The right ventricular size is severely enlarged. Right vetricular wall thickness was not well visualized. Right ventricular systolic function is severely reduced. Left Atrium: Left atrial size was mild to moderately dilated. Right Atrium: Right atrial size was severely dilated. Pericardium: There is no evidence of pericardial effusion. Mitral Valve: The mitral valve is normal in  structure. Mild to moderate mitral valve regurgitation. No evidence of mitral valve stenosis. MV peak gradient, 3.6 mmHg. The mean mitral valve gradient is 1.0 mmHg. Tricuspid Valve: The tricuspid valve is normal in structure. Tricuspid valve regurgitation is mild to moderate. No evidence of tricuspid stenosis. Aortic Valve: The aortic valve is grossly  normal. There is mild calcification of the aortic valve. Aortic valve regurgitation is trivial. No aortic stenosis is present. Aortic valve mean gradient measures 2.0 mmHg. Aortic valve peak gradient measures 3.5  mmHg. Aortic valve area, by VTI measures 1.61 cm. Pulmonic Valve: The pulmonic valve was not well visualized. Pulmonic valve regurgitation is trivial. No evidence of pulmonic stenosis. Aorta: The aortic root is normal in size and structure. Venous: The inferior vena cava was not well visualized. IVC assessment for right atrial pressure unable to be performed due to mechanical ventilation. IAS/Shunts: No atrial level shunt detected by color flow Doppler.  LEFT VENTRICLE PLAX 2D LVIDd:         4.03 cm  Diastology LVIDs:         3.03 cm  LV e' medial:    4.73 cm/s LV PW:         1.30 cm  LV E/e' medial:  15.4 LV IVS:        1.30 cm  LV e' lateral:   6.25 cm/s LVOT diam:     1.80 cm  LV E/e' lateral: 11.7 LV SV:         24 LV SV Index:   11 LVOT Area:     2.54 cm  RIGHT VENTRICLE RV Basal diam:  4.74 cm RV Mid diam:    4.95 cm RV S prime:     4.00 cm/s TAPSE (M-mode): 0.9 cm LEFT ATRIUM             Index       RIGHT ATRIUM           Index LA diam:        4.40 cm 2.10 cm/m  RA Area:     32.10 cm LA Vol (A2C):   80.4 ml 38.30 ml/m RA Volume:   121.00 ml 57.64 ml/m LA Vol (A4C):   74.2 ml 35.34 ml/m LA Biplane Vol: 78.4 ml 37.35 ml/m  AORTIC VALVE AV Area (Vmax):    1.64 cm AV Area (Vmean):   1.59 cm AV Area (VTI):     1.61 cm AV Vmax:           93.50 cm/s AV Vmean:          60.900 cm/s AV VTI:            0.147 m AV Peak Grad:      3.5 mmHg AV Mean Grad:      2.0 mmHg LVOT Vmax:         60.40 cm/s LVOT Vmean:        38.000 cm/s LVOT VTI:          0.093 m LVOT/AV VTI ratio: 0.63  AORTA Ao Root diam: 3.10 cm Ao Asc diam:  3.50 cm MITRAL VALVE               TRICUSPID VALVE MV Area (PHT): 3.53 cm    TR Peak grad:   23.6 mmHg MV Peak grad:  3.6 mmHg    TR Vmax:        243.00 cm/s MV Mean grad:  1.0 mmHg MV  Vmax:  0.95 m/s    SHUNTS MV Vmean:      44.3 cm/s   Systemic VTI:  0.09 m MV Decel Time: 215 msec    Systemic Diam: 1.80 cm MV E velocity: 72.90 cm/s MV A velocity: 48.00 cm/s MV E/A ratio:  1.52 Cherlynn Kaiser MD Electronically signed by Cherlynn Kaiser MD Signature Date/Time: 03/16/2020/10:35:55 AM    Final     Assessment and Plan:    NSTEMI, Type II CAD s/p CABG and PCI with stent placement  Patient symptoms are consistent with type II NSTEMI 2/2 to stress from acute cholecystitis and anesthesia induction in the setting of significant atherosclerotic disease. Patient is apparently frail with poor functional status at baseline. He lives a primarily sedentary life and uses a walker for stability when ambulating. Patient is likely too tenuous to tolerate invasive therapy. Therefore, we will try to manage his NSTEMI medically. He is currently on ASA 81 mg, pravastatin 40 mg, and heparin. Patient will need to be switched to high-intensity statin and ultimately need to be start on beta blocker once his acute decompensated HF has improved. Similarly, he would benefit form stating ACEI/ARB/ARNI once AKI has improved.    - Switch to high intensity statin - Hold off on beta blocker and ACE/ARB/ARNi - Continue ASA 81 mg daily  - Continue heparin for anticoagulation for 48 hours then transition to antiplatelet therapy alone.   Hypovolemic shock 2/2 to acute cholecystitis vs cardiogenic shock: - Continue norepi to maintain MAPs between 60-65. Hold off on milrinone 2/2 to AKI. - Continue broad spectrum antibiotics   Atrial fibrillation: Patient still in atrial fibrillation with normal rate. He is on amiodarone and heparin for anticoagulation. We will continue heparin and bridge pack to warfarin for long term anticoagulation. We will restart rate control with beta blocker once acute decompensated HF as improved.   Acute hypoxic respiratory failure: Patient on mechanical ventilation and being managed  by PCCM.  Acute cholecystitis: Patient will continue zosyn and perc-chole tube will be placed by IR today. Defer management to PCCM.  AKI on CKD stage III Patient presented with acute on chronic kidney disease with a current Cr of 2.47 (GFR 26) which is elevated from baseline CR of 1.64 and GFR of 39. Patient has some improvement of kidney function with some oliguria over the last 24 hours. Will defer management to PCCM.   For questions or updates, please contact [place] Please consult [email or web address] for contact info under Cardiology/STEMI.   Signed, Marianna Payment, MD  03/16/2020 1:02 PM   Patient seen, examined. Available data reviewed. Agree with findings, assessment, and plan as outlined by Dr Marianna Payment.  He is intubated and sedated, but awake and able to follow some commands.  His son is at the bedside.  The patient is an obese, elderly man who appears to be in no significant distress.  JVP: Unable to assess because of body habitus, lungs are clear bilaterally, heart is regular rate and rhythm with no murmur or gallop, abdomen is soft and tender to palpation, extremities without edema.  Reviewed notes carefully.  It appears the patient had a PEA arrest during placement of a percutaneous cholecystostomy tube.  He required CPR and epinephrine.  He then developed a wide-complex tachycardia and received amiodarone to stabilize his rhythm.  He apparently did not require a shock/defibrillation.  Post arrest evaluation has demonstrated findings consistent with non-ST elevation infarct with troponin of 1364.  The patient also was noted to have severe LV  dysfunction with an LVEF of 20%.  He has mild to moderate MR and TR and severe RV dysfunction with echo evidence of RV volume overload.  The patient is very complicated with multiorgan dysfunction, acute kidney injury with current creatinine 2.47, poor functional capacity at baseline, and now with acute cholecystitis.  He is currently stable on IV heparin  and IV amiodarone.  I would anticipate conservative medical therapy for him as I do not think he will be a candidate for any invasive cardiac evaluation due to his noncardiac illness and multiple comorbid conditions outlined above.  Discussed this with the patient's son who is at the bedside.  We will follow along to help adjust his medical therapy.  He is currently not a candidate for ACE/ARB/aldosterone antagonism because of acute kidney injury.  He is not a candidate for medical therapy for his congestive heart failure because of inotropic dependence (low-dose norepi) at present.  Sherren Mocha, M.D. 03/16/2020 4:27 PM

## 2020-03-17 DIAGNOSIS — I5021 Acute systolic (congestive) heart failure: Secondary | ICD-10-CM | POA: Diagnosis not present

## 2020-03-17 DIAGNOSIS — N179 Acute kidney failure, unspecified: Secondary | ICD-10-CM | POA: Diagnosis not present

## 2020-03-17 DIAGNOSIS — I469 Cardiac arrest, cause unspecified: Secondary | ICD-10-CM | POA: Diagnosis not present

## 2020-03-17 DIAGNOSIS — K810 Acute cholecystitis: Secondary | ICD-10-CM | POA: Diagnosis not present

## 2020-03-17 DIAGNOSIS — R6521 Severe sepsis with septic shock: Secondary | ICD-10-CM

## 2020-03-17 DIAGNOSIS — I482 Chronic atrial fibrillation, unspecified: Secondary | ICD-10-CM | POA: Diagnosis not present

## 2020-03-17 LAB — CBC
HCT: 33.2 % — ABNORMAL LOW (ref 39.0–52.0)
Hemoglobin: 11.1 g/dL — ABNORMAL LOW (ref 13.0–17.0)
MCH: 33.7 pg (ref 26.0–34.0)
MCHC: 33.4 g/dL (ref 30.0–36.0)
MCV: 100.9 fL — ABNORMAL HIGH (ref 80.0–100.0)
Platelets: 145 10*3/uL — ABNORMAL LOW (ref 150–400)
RBC: 3.29 MIL/uL — ABNORMAL LOW (ref 4.22–5.81)
RDW: 13.9 % (ref 11.5–15.5)
WBC: 9.6 10*3/uL (ref 4.0–10.5)
nRBC: 0 % (ref 0.0–0.2)

## 2020-03-17 LAB — BASIC METABOLIC PANEL
Anion gap: 12 (ref 5–15)
BUN: 91 mg/dL — ABNORMAL HIGH (ref 8–23)
CO2: 29 mmol/L (ref 22–32)
Calcium: 8.2 mg/dL — ABNORMAL LOW (ref 8.9–10.3)
Chloride: 95 mmol/L — ABNORMAL LOW (ref 98–111)
Creatinine, Ser: 2.51 mg/dL — ABNORMAL HIGH (ref 0.61–1.24)
GFR, Estimated: 25 mL/min — ABNORMAL LOW (ref >60)
Glucose, Bld: 132 mg/dL — ABNORMAL HIGH (ref 70–99)
Potassium: 3 mmol/L — ABNORMAL LOW (ref 3.5–5.1)
Sodium: 136 mmol/L (ref 135–145)

## 2020-03-17 LAB — GLUCOSE, CAPILLARY
Glucose-Capillary: 112 mg/dL — ABNORMAL HIGH (ref 70–99)
Glucose-Capillary: 130 mg/dL — ABNORMAL HIGH (ref 70–99)
Glucose-Capillary: 136 mg/dL — ABNORMAL HIGH (ref 70–99)
Glucose-Capillary: 141 mg/dL — ABNORMAL HIGH (ref 70–99)
Glucose-Capillary: 80 mg/dL (ref 70–99)
Glucose-Capillary: 98 mg/dL (ref 70–99)

## 2020-03-17 LAB — HEPARIN LEVEL (UNFRACTIONATED)
Heparin Unfractionated: 0.11 IU/mL — ABNORMAL LOW (ref 0.30–0.70)
Heparin Unfractionated: 0.17 IU/mL — ABNORMAL LOW (ref 0.30–0.70)
Heparin Unfractionated: 0.52 IU/mL (ref 0.30–0.70)

## 2020-03-17 LAB — MAGNESIUM: Magnesium: 2.5 mg/dL — ABNORMAL HIGH (ref 1.7–2.4)

## 2020-03-17 MED ORDER — WARFARIN SODIUM 5 MG PO TABS
5.0000 mg | ORAL_TABLET | Freq: Once | ORAL | Status: AC
  Administered 2020-03-17: 5 mg via ORAL
  Filled 2020-03-17: qty 1

## 2020-03-17 MED ORDER — DOCUSATE SODIUM 50 MG/5ML PO LIQD
100.0000 mg | Freq: Two times a day (BID) | ORAL | Status: DC
  Administered 2020-03-17: 100 mg via ORAL
  Filled 2020-03-17: qty 10

## 2020-03-17 MED ORDER — ONDANSETRON HCL 4 MG PO TABS
4.0000 mg | ORAL_TABLET | Freq: Four times a day (QID) | ORAL | Status: DC | PRN
  Administered 2020-03-28: 4 mg via ORAL
  Filled 2020-03-17: qty 1

## 2020-03-17 MED ORDER — ASPIRIN 81 MG PO CHEW
81.0000 mg | CHEWABLE_TABLET | Freq: Every day | ORAL | Status: DC
  Administered 2020-03-18 – 2020-03-23 (×6): 81 mg via ORAL
  Filled 2020-03-17 (×6): qty 1

## 2020-03-17 MED ORDER — QUETIAPINE FUMARATE 50 MG PO TABS
50.0000 mg | ORAL_TABLET | Freq: Every day | ORAL | Status: DC
  Administered 2020-03-17 – 2020-04-04 (×18): 50 mg via ORAL
  Filled 2020-03-17 (×18): qty 1

## 2020-03-17 MED ORDER — POTASSIUM CHLORIDE 20 MEQ PO PACK: 40.0000 meq | PACK | ORAL | Status: DC

## 2020-03-17 MED ORDER — ROSUVASTATIN CALCIUM 5 MG PO TABS: 10.0000 mg | ORAL_TABLET | Freq: Every day | ORAL | Status: DC

## 2020-03-17 MED ORDER — ATROPINE SULFATE 1 MG/10ML IJ SOSY
PREFILLED_SYRINGE | INTRAMUSCULAR | Status: AC
  Filled 2020-03-17: qty 10

## 2020-03-17 MED ORDER — ATROPINE SULFATE 1 MG/10ML IJ SOSY
1.0000 mg | PREFILLED_SYRINGE | Freq: Once | INTRAMUSCULAR | Status: AC
  Administered 2020-03-17: 1 mg via INTRAVENOUS

## 2020-03-17 MED ORDER — ACETAMINOPHEN 325 MG PO TABS
650.0000 mg | ORAL_TABLET | ORAL | Status: DC
  Administered 2020-03-17 – 2020-03-18 (×3): 650 mg via ORAL
  Filled 2020-03-17 (×3): qty 2

## 2020-03-17 MED ORDER — ALLOPURINOL 100 MG PO TABS
100.0000 mg | ORAL_TABLET | Freq: Two times a day (BID) | ORAL | Status: DC
  Administered 2020-03-17 – 2020-04-05 (×36): 100 mg via ORAL
  Filled 2020-03-17 (×36): qty 1

## 2020-03-17 MED ORDER — POLYETHYLENE GLYCOL 3350 17 G PO PACK
17.0000 g | PACK | Freq: Every day | ORAL | Status: DC
  Filled 2020-03-17: qty 1

## 2020-03-17 MED ORDER — ACETAMINOPHEN 650 MG RE SUPP: 650.0000 mg | RECTAL | Status: DC

## 2020-03-17 MED ORDER — HEPARIN BOLUS VIA INFUSION
2000.0000 [IU] | Freq: Once | INTRAVENOUS | Status: DC
  Filled 2020-03-17: qty 2000

## 2020-03-17 MED ORDER — ROSUVASTATIN CALCIUM 5 MG PO TABS
10.0000 mg | ORAL_TABLET | Freq: Every day | ORAL | Status: DC
  Administered 2020-03-17 – 2020-04-04 (×18): 10 mg via ORAL
  Filled 2020-03-17 (×18): qty 2

## 2020-03-17 MED ORDER — POTASSIUM CHLORIDE 10 MEQ/100ML IV SOLN
10.0000 meq | INTRAVENOUS | Status: DC
  Administered 2020-03-17: 10 meq via INTRAVENOUS
  Filled 2020-03-17 (×3): qty 100

## 2020-03-17 MED ORDER — WARFARIN SODIUM 5 MG PO TABS
5.0000 mg | ORAL_TABLET | Freq: Once | ORAL | Status: DC
  Filled 2020-03-17: qty 1

## 2020-03-17 MED ORDER — GABAPENTIN 250 MG/5ML PO SOLN
300.0000 mg | Freq: Two times a day (BID) | ORAL | Status: DC
  Administered 2020-03-17: 300 mg via ORAL
  Filled 2020-03-17 (×3): qty 6

## 2020-03-17 MED ORDER — POTASSIUM CHLORIDE 20 MEQ PO PACK
40.0000 meq | PACK | ORAL | Status: DC
  Administered 2020-03-17: 40 meq
  Filled 2020-03-17: qty 2

## 2020-03-17 MED ORDER — ACETAMINOPHEN 160 MG/5ML PO SOLN
650.0000 mg | ORAL | Status: DC
  Administered 2020-03-17: 650 mg via ORAL
  Filled 2020-03-17: qty 20.3

## 2020-03-17 MED ORDER — HYDRALAZINE HCL 20 MG/ML IJ SOLN
5.0000 mg | INTRAMUSCULAR | Status: DC | PRN
  Administered 2020-03-17: 5 mg via INTRAVENOUS
  Filled 2020-03-17: qty 1

## 2020-03-17 MED ORDER — WARFARIN - PHARMACIST DOSING INPATIENT: Freq: Every day | Status: DC

## 2020-03-17 MED ORDER — POTASSIUM CHLORIDE 20 MEQ PO PACK
40.0000 meq | PACK | Freq: Once | ORAL | Status: AC
  Administered 2020-03-17: 40 meq via ORAL
  Filled 2020-03-17: qty 2

## 2020-03-17 MED ORDER — ONDANSETRON HCL 4 MG/2ML IJ SOLN: 4.0000 mg | Freq: Four times a day (QID) | INTRAMUSCULAR | Status: DC | PRN

## 2020-03-17 NOTE — Progress Notes (Addendum)
NAME:  Isaiah [name], MRN:  [id number], DOB:  July 06, 1940, LOS: 3 ADMISSION DATE:  03/14/2020, CONSULTATION DATE:  03/15/2020 REFERRING MD:  Anesthesia, CHIEF COMPLAINT:  Code  blue  Brief History   79 y.o. M who was admitted for acute cholecystitis and taken to IR for Perc Chole tube placement and coded during the procedure obtain ROSC and transferred to ICU  Past Medical History  Atrial fibrillation (HCC), CAD (coronary artery disease), CHF (congestive heart failure) (HCC), Dementia (HCC), Diabetes mellitus, DVT (deep venous thrombosis) (HCC), Fall at home (10/2015), Hyperlipidemia, Hypertension, Left-sided carotid artery disease (HCC), MRSA infection, Myocardial infarction (HCC), Obesity, Peripheral vascular disease (HCC), Sleep apnea, Stroke (HCC), Stroke (HCC), and Venous insufficiency  Significant Hospital Events   11/1 Admit to hospitalists 11/2 To IR>code blue>PCCM consult 11/3 To IR for per chole drain placement Consults:  PCCM IR Surgery Cardiology Procedures:  11/2 ETT 11/3 Perc chole drain   Significant Diagnostic Tests:  11/1 CT head>> no acute findings, small vessel ischemic changes  11/1 CT abd/pelvis>>Distended gallbladder with layering gallstones and mild pericholecystic stranding, raising concern for acute cholecystitis.  Micro Data:  11/1 Covid and Flu>>negative 11/1 MRSA screen >> negative 11/2 BCx2>> 11/2 UC>> Negative  Antimicrobials:  Cefepime 11/1 Ceftriaxone 11/2 Flagyl 11/1-11/2 Zosyn 11/2-   Interim history/subjective:  No significant overnight events. Patient is awake this morning following commands. Daughter is at bedside. Responding positively to plan for ETT removal today.  Objective   Blood pressure (!) 112/91, pulse (!) 57, temperature 98.8 F (37.1 C), temperature source Oral, resp. rate 16, height  (1.753 m), weight 94.2 kg, SpO2 100 %.    Vent Mode: PRVC FiO2 (%):  [40 %-100 %] 40 % Set Rate:  [18 bmp] 18 bmp Vt Set:  [560 mL]  560 mL PEEP:  [5 cmH20] 5 cmH20 Plateau Pressure:  [16 cmH20-21 cmH20] 21 cmH20   Intake/Output Summary (Last 24 hours) at 03/17/2020 0725 Last data filed at 03/17/2020 0600 Gross per 24 hour  Intake 1225.64 ml  Output 1190 ml  Net 35.64 ml   Filed Weights   03/14/20 0810 03/14/20 2205  Weight: 95.3 kg 94.2 kg    Examination: General: Overweight male, intubated, awake  HENT: ETT in place Lungs: clear to ascultation anteriorly  Cardiovascular: Normal S1 and S2, irregular rhythm normal rate Abdomen: Soft, RUG tenderness to palpation, perc chole in place with bilious drainage,  mildly distended normal bowel sounds, incision site clean dressed no bleeding Extremities: warm 1+ edema bilaterally Neuro: Pupils equal and reactive to light Skin: bilateral decubitus ulcers of buttocks, clean dressed  Resolved Hospital Problem list     Assessment & Plan:   Neurologic: Acute encephalopathy Patient awake and following commands, able to nod yes and no to questions off sedation does not appear to have anoxic brain injury at this time. - Attempt to titrate down sedation for possible extubation today  Cardiovascular: Type II NSTEMI in setting of cardiac arrest and CAD s/p CABG and PCI with stents Patient with significant cardiac history with CABG in 2018. Elevated troponin overnight up to 1364 and ischemic EKG changes. TTE  -Cardiology consulted no recommending invasive internvention  -ASA 81 mg -Continue heparin drip  -Start rosuvastatin 10 mg -Will hold of on starting ACE/ARB/ARNI in the setting of AKI and BB forbradycardia  Hypovolemic shock in setting of sepsis Septic cholecystitis likely source. Off pressors. Noted to be hypertensive to 160s this morning. - continue Zosyn until 03/20/2020 - Hydralazine 5  mg IV PRN for SBP >160 - hold lasix and metolazone   Acute on chronic systolic heart failure TTE with LVEF of 20% and global hypokineses. Mild LVH, decreased RV systolic function  with severe ventricular enlargement. Right atrium severely dilated, mild dilation of left atrium. Mild to moderate MR and TR.  -Unable to start on beta blocker or angiotension system blockade due to AI and bradycardia -May benefit for outpatient follow up with heart failure clinic  Atrial fibrillation On warfarin at home. Amiodarone d/c in setting of bradycardia. No further episodes of wide complex tachycardia - Warfarin per pharmacy - Not on BB at home due to bradycardia, consider starting on BB if HR permits  Respiratory: Acute hypoxic resipiratory failure secondary to sepsis, underlying OSA, and cardiac arrest - Currently on minimal vent settings - Will decrease sedation and assess SBT with plan for possible extubation today  Gastrointestinal:  Acute cholecystitis  Per chole drain placed 11/3 draining bilious fluid - On day 3 of Zosyn plan to continue until 11/7  Renal AKI Cr. 2.52 today kidney function stable. K of 3.0 today will replete - Potassium 40 mEq x2 today - Follow up BMP in am  Endocrine: Diabetes  Poorly controlled diabetes with A1c of 12.1. Home regimen glipizide 5 mg BID and Novolog 70/30 50 units twice daily. Euglycemic on basal insulin - Continue lantus 25 units twice daily - CBG monitoring - SSI resistant   Skin: Bilateral buttock stage II pressure ulcers -continue wound care  Best practice:  Diet: Tube feeds vs heart health carb modified diet if able to extubate DVT prophylaxis: Heparin GI prophylaxis: PPI Glucose control: SSI Mobility: BR Code Status: Full Family Communication: Discussed plan for possible extubation today with daughter Disposition: ICU  Labs   CBC: Recent Labs  Lab 03/14/20 0743 03/14/20 0743 03/15/20 0027 03/15/20 0027 03/15/20 1650 03/15/20 1802 03/16/20 0309 03/16/20 0347 03/17/20 0357  WBC 21.2*  --  17.1*  --  21.3*  --  14.6*  --  9.6  NEUTROABS 18.6*  --   --   --   --   --   --   --   --   HGB 13.9   < > 12.7*    < > 11.8* 10.9* 11.8* 12.6* 11.1*  HCT 43.4   < > 39.3   < > 36.8* 32.0* 35.8* 37.0* 33.2*  MCV 105.3*  --  104.0*  --  105.1*  --  100.8*  --  100.9*  PLT 141*  --  127*  --  142*  --  179  --  145*   < > = values in this interval not displayed.    Basic Metabolic Panel: Recent Labs  Lab 03/14/20 0743 03/14/20 0743 03/15/20 0027 03/15/20 0027 03/15/20 1650 03/15/20 1757 03/15/20 1802 03/16/20 0309 03/16/20 0347 03/17/20 0357  NA 130*   < > 131*   < > 135  --  140 131* 132* 136  K 3.5   < > 3.0*   < > 3.3*  --  2.6* 4.1 4.2 3.0*  CL 78*  --  86*  --  90*  --   --  90*  --  95*  CO2 35*  --  33*  --  34*  --   --  25  --  29  GLUCOSE 306*  --  207*  --  198*  --   --  309*  --  132*  BUN 73*  --  83*  --  85*  --   --  92*  --  91*  CREATININE 3.45*  --  2.75*  --  2.53*  --   --  2.47*  --  2.51*  CALCIUM 10.0  --  9.2  --  8.4*  --   --  8.3*  --  8.2*  MG  --   --  2.6*  --   --  2.5*  --  2.3  --  2.5*  PHOS  --   --   --   --   --   --   --  3.7  --   --    < > = values in this interval not displayed.   GFR: Estimated Creatinine Clearance: 27 mL/min (A) (by C-G formula based on SCr of 2.51 mg/dL (H)). Recent Labs  Lab 03/14/20 0743 03/14/20 0818 03/14/20 2125 03/15/20 0027 03/15/20 1650 03/15/20 1757 03/15/20 2231 03/16/20 0309 03/17/20 0357  PROCALCITON  --  7.85  --   --   --   --   --   --   --   WBC   < >  --   --  17.1* 21.3*  --   --  14.6* 9.6  LATICACIDVEN   < >  --  1.2 0.9  --  2.6* 2.8*  --   --    < > = values in this interval not displayed.    Liver Function Tests: Recent Labs  Lab 03/14/20 0743  AST 24  ALT 13  ALKPHOS 82  BILITOT 1.1  PROT 7.8  ALBUMIN 2.8*   Recent Labs  Lab 03/14/20 0818  LIPASE 35   Recent Labs  Lab 03/14/20 0744  AMMONIA 17    ABG    Component Value Date/Time   PHART 7.562 (H) 03/16/2020 0347   PCO2ART 36.4 03/16/2020 0347   PO2ART 189 (H) 03/16/2020 0347   HCO3 32.6 (H) 03/16/2020 0347   TCO2 34  (H) 03/16/2020 0347   O2SAT 100.0 03/16/2020 0347     Coagulation Profile: Recent Labs  Lab 03/14/20 0911 03/15/20 0643 03/15/20 1757  INR 2.7* 1.5* 1.5*    Cardiac Enzymes: Results for REAKWON, BARREN (MRN [id number]) as of 03/16/2020 07:09  Ref. Range 03/15/2020 17:05 03/15/2020 19:16 03/16/2020 00:08 03/16/2020 03:09  Troponin I (High Sensitivity) Latest Ref Range: <18 ng/L 61 (H) 308 (HH) 982 (HH) 1,364 (HH)    HbA1C: Hgb A1c MFr Bld  Date/Time Value Ref Range Status  03/14/2020 09:25 PM 12.1 (H) 4.8 - 5.6 % Final    Comment:    (NOTE) Pre diabetes:          5.7%-6.4%  Diabetes:              >6.4%  Glycemic control for   <7.0% adults with diabetes   12/12/2018 09:01 PM 10.0 (H) 4.8 - 5.6 % Final    Comment:    (NOTE) Pre diabetes:          5.7%-6.4% Diabetes:              >6.4% Glycemic control for   <7.0% adults with diabetes     CBG: Recent Labs  Lab 03/16/20 1230 03/16/20 1621 03/16/20 1942 03/16/20 2319 03/17/20 0343  GLUCAP 164* 185* 151* 144* 130*   Past Medical History  He,  has a past medical history of Atrial fibrillation (HCC), CAD (coronary artery disease), CHF (congestive heart failure) (HCC), Dementia (HCC), Diabetes mellitus, DVT (deep venous thrombosis) (  HCC), Fall at home (10/2015), Hyperlipidemia, Hypertension, Left-sided carotid artery disease (HCC), MRSA infection, Myocardial infarction (HCC), Obesity, Peripheral vascular disease (HCC), Sleep apnea, Stroke [facility]), Stroke (HCC), and Venous insufficiency.   Surgical History    Past Surgical History:  Procedure Laterality Date  . CORONARY ARTERY BYPASS GRAFT  1997   vein harvest from right leg  . CORONARY STENT INTERVENTION N/A 08/07/2016   Procedure: Coronary Stent Intervention;  Surgeon: Corky Crafts, MD;  Location: [facility] INVASIVE CV LAB;  Service: Cardiovascular;  Laterality: N/A;  RCA  . ESOPHAGOGASTRODUODENOSCOPY N/A 08/12/2016   Procedure: ESOPHAGOGASTRODUODENOSCOPY (EGD);  Surgeon:  Carman Ching, MD;  Location: [facility] ENDOSCOPY;  Service: Endoscopy;  Laterality: N/A;  . INGUINAL HERNIA REPAIR  1980's   Bilateral,  done in [place]  . IR PERC CHOLECYSTOSTOMY  03/16/2020  . LEFT HEART CATH AND CORS/GRAFTS ANGIOGRAPHY N/A 08/07/2016   Procedure: Left Heart Cath and Cors/Grafts Angiography;  Surgeon: Corky Crafts, MD;  Location: [facility] INVASIVE CV LAB;  Service: Cardiovascular;  Laterality: N/A;  . PR VEIN BYPASS GRAFT,AORTO-FEM-POP  1980   FEM-FEM BPG by [name]  . RADIOLOGY WITH ANESTHESIA N/A 03/15/2020   Procedure: IR WITH ANESTHESIA;  Surgeon: Irish Lack, MD;  Location: [facility] OR;  Service: Radiology;  Laterality: N/A;     Social History   reports that he quit smoking about 41 years ago. His smoking use included cigarettes. He has never used smokeless tobacco. He reports that he does not drink alcohol and does not use drugs.   Family History   His family history includes Cancer in his father; Diabetes in his daughter, father, and sister; Heart attack in his brother and father; Heart disease in his father, mother, and sister; Hyperlipidemia in his brother, daughter, father, mother, and sister; Hypertension in his brother, daughter, father, mother, sister, and son; Other in his brother, daughter, father, mother, and sister; Varicose Veins in his daughter, mother, and sister.   Allergies No Known Allergies   Home Medications  Prior to Admission medications   Medication Sig Start Date End Date Taking? Authorizing Provider  acetaminophen (TYLENOL) 500 MG tablet Take 500-1,000 mg by mouth every 6 (six) hours as needed for mild pain or headache.   Yes [provider]  allopurinol (ZYLOPRIM) 100 MG tablet TAKE 2 TABLETS BY MOUTH DAILY. Patient taking differently: Take 100 mg by mouth 2 (two) times daily.  05/03/14  Yes Runell Gess, MD  colchicine 0.6 MG tablet Take 0.6 mg by mouth daily as needed (gout attacks).   Yes [provider]  ferrous  sulfate 325 (65 FE) MG tablet Take 1 tablet (325 mg total) by mouth 2 (two) times daily with a meal. Patient taking differently: Take 325 mg by mouth daily with breakfast.  08/09/16  Yes Ghimire, Werner Lean, MD  furosemide (LASIX) 40 MG tablet Take 1 tablet (40 mg total) by mouth 2 (two) times daily. Patient taking differently: Take 40 mg by mouth in the morning.  08/24/16  Yes Osvaldo Shipper, MD  gabapentin (NEURONTIN) 300 MG capsule Take 300-600 mg by mouth See admin instructions. Take 300 mg by mouth three times a day and 600 mg at bedtime   Yes [provider]  glipiZIDE (GLUCOTROL) 5 MG tablet Take 5 mg by mouth 2 (two) times daily before a meal.   Yes [provider]  hydrALAZINE (APRESOLINE) 100 MG tablet Take 100 mg by mouth 2 (two) times daily.   Yes [provider]  insulin aspart protamine- aspart (NOVOLOG MIX 70/30) (70-30) 100 UNIT/ML injection Inject 50 Units into the skin 2 (two) times daily before a meal.    Yes [provider]  isosorbide mononitrate (IMDUR) 60 MG 24 hr tablet Take 1 tablet (60 mg total) by mouth daily. Appointment needed for future refills Patient taking differently: Take 60 mg by mouth daily.  05/03/15  Yes Runell Gess, MD  metolazone (ZAROXOLYN) 2.5 MG tablet Take 2.5 mg by mouth daily as needed (AS DIRECTED FOR MARKED SWELLING).   Yes [provider]  Multiple Vitamin (MULTIVITAMIN WITH MINERALS) TABS tablet Take 1 tablet by mouth daily.   Yes [provider]  nitroGLYCERIN (NITROSTAT) 0.4 MG SL tablet Place 0.4 mg under the tongue every 5 (five) minutes as needed for chest pain.   Yes [provider]  pantoprazole (PROTONIX) 40 MG tablet Take 40 mg by mouth 2 (two) times daily before a meal.    Yes [provider]  potassium chloride (K-DUR) 10 MEQ tablet Take 1 tablet (10 mEq total) by mouth daily. 08/31/16  Yes Barrett, Joline Salt, PA-C  pravastatin (PRAVACHOL) 40 MG tablet Take 40 mg by  mouth at bedtime.    Yes [provider]  PRESCRIPTION MEDICATION Inhale into the lungs See admin instructions. CPAP- At bedtime and during any time of rest   Yes [provider]  Psyllium (METAMUCIL PO) Take 2 capsules by mouth at bedtime.   Yes [provider]  QUEtiapine (SEROQUEL) 50 MG tablet Take 50 mg by mouth at bedtime.    Yes [provider]  warfarin (COUMADIN) 1 MG tablet Take 1 mg by mouth daily as needed (in conjunction with the 5 mg strength, AS DIRECTED- depending on INR).  12/18/19  Yes [provider]  warfarin (COUMADIN) 5 MG tablet Take 5 mg by mouth daily with supper. 01/28/20  Yes [provider]  docusate sodium (COLACE) 100 MG capsule Take 1 capsule (100 mg total) by mouth 2 (two) times daily. Patient not taking: Reported on 03/14/2020 12/16/18   Jadene Pierini, MD  oxyCODONE (OXY IR/ROXICODONE) 5 MG immediate release tablet Take 1 tablet (5 mg total) by mouth every 4 (four) hours as needed (pain). Patient not taking: Reported on 03/14/2020 12/16/18   Jadene Pierini, MD     Quincy Simmonds, MD PGY 1 [facility]  03/17/20 7:25 AM Pager [phone]

## 2020-03-17 NOTE — Progress Notes (Signed)
Notified MD of pt. HR 30s (afib) sustaining for 4 minutes. Patient alert and appropriate to baseline cognitive status. MD Laural Benes Order to monitor for now and assessed patient as well. Rn will continue to closely monitor.

## 2020-03-17 NOTE — Progress Notes (Addendum)
ANTICOAGULATION CONSULT NOTE   Pharmacy Consult for heparin Indication: chest pain/ACS  No Known Allergies  Patient Measurements: Height: 5\' 9"  (175.3 cm) Weight: 94.2 kg (207 lb 10.8 oz) IBW/kg (Calculated) : 70.7 Heparin Dosing Weight: 90 kg  Vital Signs: Temp: 99 F (37.2 C) (11/03 2331) Temp Source: Axillary (11/03 2331) BP: 106/53 (11/04 0000) Pulse Rate: 57 (11/04 0000)  Labs: Recent Labs    03/14/20 0743 03/14/20 0911 03/15/20 0027 03/15/20 0027 03/15/20 0643 03/15/20 1650 03/15/20 1650 03/15/20 1705 03/15/20 1757 03/15/20 1802 03/15/20 1802 03/15/20 1916 03/16/20 0008 03/16/20 0309 03/16/20 0347 03/16/20 0521 03/16/20 2346  HGB   < >  --  12.7*   < >  --  11.8*   < >  --   --  10.9*   < >  --   --  11.8* 12.6*  --   --   HCT   < >  --  39.3   < >  --  36.8*   < >  --   --  32.0*  --   --   --  35.8* 37.0*  --   --   PLT   < >  --  127*  --   --  142*  --   --   --   --   --   --   --  179  --   --   --   APTT  --  44*  --   --   --   --   --   --  35  --   --   --   --   --   --   --   --   LABPROT  --  28.1*  --   --  17.4*  --   --   --  17.1*  --   --   --   --   --   --   --   --   INR  --  2.7*  --   --  1.5*  --   --   --  1.5*  --   --   --   --   --   --   --   --   HEPARINUNFRC  --   --   --   --   --   --   --   --   --   --   --   --   --   --   --  <0.10* 0.11*  CREATININE   < >  --  2.75*  --   --  2.53*  --   --   --   --   --   --   --  2.47*  --   --   --   TROPONINIHS  --   --   --   --   --   --   --    < >  --   --   --  308* 982* 1,364*  --   --   --    < > = values in this interval not displayed.    Estimated Creatinine Clearance: 27.5 mL/min (A) (by C-G formula based on SCr of 2.47 mg/dL (H)).  Assessment: 79 y.o. male with NSTEMI for heparin.  Heparin level remains low. Goal of Therapy:  Heparin level 0.3-0.7 units/ml Monitor platelets by anticoagulation protocol: Yes   Plan:  Increase heparin 1700 units/hr  Check heparin  level in 8 hours.  Geannie Risen, PharmD, BCPS   03/17/2020 12:35 AM

## 2020-03-17 NOTE — Procedures (Signed)
Extubation Procedure Note  Patient Details:   Name: Isaiah [name] DOB: 1940-11-29 MRN: [id number]   Airway Documentation:    Vent end date: 03/17/20 Vent end time: 1340   Evaluation  O2 sats: stable throughout Complications: No apparent complications Patient did tolerate procedure well. Bilateral Breath Sounds: Diminished   Pt extubated to 3l [place] per MD order. Pt had positive cuff leak and no stridor noted. Pt is able to voice his name.   [name] 03/17/2020, 1:44 PM

## 2020-03-17 NOTE — Progress Notes (Signed)
Progress Note  Patient Name: Isaiah [name] Date of Encounter: 03/17/2020  [facility] Cardiologist: No primary care provider on file.   Subjective   Intubated, sedated, but responsive. Denies pain. Hx otherwise very limited.  Inpatient Medications    Scheduled Meds: . acetaminophen  650 mg Per Tube Q4H   Or  . acetaminophen (TYLENOL) oral liquid 160 mg/5 mL  650 mg Per Tube Q4H   Or  . acetaminophen  650 mg Rectal Q4H  . allopurinol  100 mg Per Tube BID  . aspirin  81 mg Per Tube Daily  . chlorhexidine gluconate (MEDLINE KIT)  15 mL Mouth Rinse BID  . Chlorhexidine Gluconate Cloth  6 each Topical Daily  . docusate  100 mg Per Tube BID  . gabapentin  300 mg Per Tube Q12H  . insulin aspart  0-20 Units Subcutaneous Q4H  . insulin glargine  25 Units Subcutaneous BID  . mouth rinse  15 mL Mouth Rinse 10 times per day  . pantoprazole (PROTONIX) IV  40 mg Intravenous Q24H  . polyethylene glycol  17 g Per Tube Daily  . pravastatin  40 mg Per Tube QHS  . QUEtiapine  50 mg Per Tube Q supper  . sodium chloride flush  3 mL Intravenous Q12H   Continuous Infusions: . sodium chloride Stopped (03/17/20 0557)  . amiodarone 30 mg/hr (03/17/20 0600)  . fentaNYL infusion INTRAVENOUS 75 mcg/hr (03/17/20 0600)  . heparin 1,700 Units/hr (03/17/20 0044)  . norepinephrine (LEVOPHED) Adult infusion Stopped (03/17/20 0356)  . piperacillin-tazobactam (ZOSYN)  IV 12.5 mL/hr at 03/17/20 0600   PRN Meds: fentaNYL (SUBLIMAZE) injection, fentaNYL (SUBLIMAZE) injection, midazolam, midazolam, ondansetron **OR** ondansetron (ZOFRAN) IV   Vital Signs    Vitals:   03/17/20 0400 03/17/20 0407 03/17/20 0500 03/17/20 0600  BP: 126/76  (!) 128/42 (!) 112/91  Pulse: (!) 59  (!) 58 (!) 57  Resp: '18  18 16  ' Temp:  98.8 F (37.1 C)    TempSrc:  Oral    SpO2: 100%  100% 100%  Weight:      Height:        Intake/Output Summary (Last 24 hours) at 03/17/2020 9396 Last data filed at 03/17/2020  0600 Gross per 24 hour  Intake 1225.64 ml  Output 1190 ml  Net 35.64 ml   Last 3 Weights 03/14/2020 03/14/2020 12/13/2018  Weight (lbs) 207 lb 10.8 oz 210 lb 218 lb 7.6 oz  Weight (kg) 94.2 kg 95.255 kg 99.1 kg      Telemetry    Atrial fibrillation, HR 55-60 bpm - Personally Reviewed   Physical Exam  Intubated, obese male, awakens to verbal stimuli and able to follow simple commands GEN: No acute distress.   Neck: No JVD Cardiac: irregular, no murmurs, rubs, or gallops.  Respiratory: Clear to auscultation bilaterally. GI: Soft, nontender, non-distended  MS: No edema; No deformity. Feet warmer today. Neuro:  Nonfocal  Psych: unable to assess  Labs    High Sensitivity Troponin:   Recent Labs  Lab 03/15/20 1705 03/15/20 1916 03/16/20 0008 03/16/20 0309  TROPONINIHS 61* 308* 982* 1,364*      Chemistry Recent Labs  Lab 03/14/20 0743 03/15/20 0027 03/15/20 1650 03/15/20 1802 03/16/20 0309 03/16/20 0347 03/17/20 0357  NA 130*   < > 135   < > 131* 132* 136  K 3.5   < > 3.3*   < > 4.1 4.2 3.0*  CL 78*   < > 90*  --  90*  --  95*  CO2 35*   < > 34*  --  25  --  29  GLUCOSE 306*   < > 198*  --  309*  --  132*  BUN 73*   < > 85*  --  92*  --  91*  CREATININE 3.45*   < > 2.53*  --  2.47*  --  2.51*  CALCIUM 10.0   < > 8.4*  --  8.3*  --  8.2*  PROT 7.8  --   --   --   --   --   --   ALBUMIN 2.8*  --   --   --   --   --   --   AST 24  --   --   --   --   --   --   ALT 13  --   --   --   --   --   --   ALKPHOS 82  --   --   --   --   --   --   BILITOT 1.1  --   --   --   --   --   --   GFRNONAA 17*   < > 25*  --  26*  --  25*  ANIONGAP 17*   < > 11  --  16*  --  12   < > = values in this interval not displayed.     Hematology Recent Labs  Lab 03/15/20 1650 03/15/20 1802 03/16/20 0309 03/16/20 0347 03/17/20 0357  WBC 21.3*  --  14.6*  --  9.6  RBC 3.50*  --  3.55*  --  3.29*  HGB 11.8*   < > 11.8* 12.6* 11.1*  HCT 36.8*   < > 35.8* 37.0* 33.2*  MCV 105.1*   --  100.8*  --  100.9*  MCH 33.7  --  33.2  --  33.7  MCHC 32.1  --  33.0  --  33.4  RDW 13.7  --  13.6  --  13.9  PLT 142*  --  179  --  145*   < > = values in this interval not displayed.    BNP Recent Labs  Lab 03/14/20 0752  BNP 532.7*     DDimer No results for input(s): DDIMER in the last 168 hours.   Radiology    IR Perc Cholecystostomy  Result Date: 03/16/2020 INDICATION: 79 year old male with acute calculus cholecystitis and florid sepsis. He presents for second attempt at percutaneous cholecystostomy tube placement after being stabilized from acute cardiac arrest. EXAM: CHOLECYSTOSTOMY MEDICATIONS: In patient currently receiving intravenous Zosyn. No additional antibiotic prophylaxis administered. ANESTHESIA/SEDATION: Moderate (conscious) sedation was employed during this procedure. A total of Versed 1 mg and Fentanyl 25 mcg was administered intravenously. Moderate Sedation Time: 13 minutes minutes. The patient's level of consciousness and vital signs were monitored continuously by radiology nursing throughout the procedure under my direct supervision. FLUOROSCOPY TIME:  Fluoroscopy Time: 0 minutes 36 seconds (5 mGy). COMPLICATIONS: None immediate. PROCEDURE: Informed written consent was obtained from the patient after a thorough discussion of the procedural risks, benefits and alternatives. All questions were addressed. Maximal Sterile Barrier Technique was utilized including caps, mask, sterile gowns, sterile gloves, sterile drape, hand hygiene and skin antiseptic. A timeout was performed prior to the initiation of the procedure. The right upper quadrant was interrogated with ultrasound. Loops of colon can be seen interposed between the abdominal  wall and liver. A suitable skin entry site that would facilitate a transhepatic access without transgressing the colon was identified. Local anesthesia was attained by infiltration with 1% lidocaine. A small dermatotomy was made. Under  real-time ultrasound guidance, a 21 gauge Accustick needle was advanced through the liver and into the gallbladder lumen. The stylet was removed and there was return of dark brown bile. A 0.018 wire was advanced in the gallbladder lumen. The Accustick needle was then exchanged for the transitional Accustick sheath which was advanced into the gallbladder lumen. Contrast injection confirms opacification of the gallbladder lumen. A 0.035 J wire was then advanced through the Accustick sheath and into the gallbladder. The Accustick sheath was removed. The transhepatic tract was percutaneously dilated to 10 [place]. A Cook 10.2 [place] all-purpose drainage catheter was advanced over the wire and formed in the gallbladder. Aspiration yields dark brown viscous bile. A sample was obtained and sent for culture. The catheter was gently flushed and connected to gravity bag drainage. The catheter was secured to the skin with 0 Prolene suture. IMPRESSION: Successful placement of percutaneous transhepatic cholecystostomy tube for acute calculus cholecystitis in a non operative candidate. Electronically Signed   By: Jacqulynn Cadet M.D.   On: 03/16/2020 16:19   DG CHEST PORT 1 VIEW  Result Date: 03/15/2020 CLINICAL DATA:  Endotracheal tube placement EXAM: PORTABLE CHEST 1 VIEW COMPARISON:  03/14/2020 FINDINGS: Cardiac shadow is enlarged but stable. Postsurgical changes are again seen. Endotracheal tube is noted in satisfactory position 3.5 cm above the carina. Gastric catheter extends into the stomach. Lungs are hypoinflated with minimal left basilar atelectasis. Mild vascular congestion is noted. No bony abnormality is seen. IMPRESSION: Endotracheal tube and gastric catheter in satisfactory position. Mild vascular congestion. Mild left basilar atelectasis new from the prior exam. Electronically Signed   By: Inez Catalina M.D.   On: 03/15/2020 18:39   ECHOCARDIOGRAM COMPLETE  Result Date: 03/16/2020    ECHOCARDIOGRAM REPORT    Patient Name:   Isaiah [name] [facility] Date of Exam: 03/16/2020 Medical Rec #:  [id number]      Height:       69.0 in Accession #:    [id number]     Weight:       207.7 lb Date of Birth:  Aug 04, 1940      BSA:          2.099 m Patient Age:    79 years       BP:           114/64 mmHg Patient Gender: M              HR:           62 bpm. Exam Location:  Inpatient Procedure: 2D Echo, Cardiac Doppler, Color Doppler and Intracardiac            Opacification Agent Indications:    Cardiac arrest  History:        Patient has prior history of Echocardiogram examinations, most                 recent 08/09/2016. CHF, CAD, Arrythmias:Atrial Fibrillation; Risk                 Factors:Hypertension and Dyslipidemia. H/O DVT and CVA.  Sonographer:    Clayton Lefort RDCS (AE) Referring Phys: [id number] Candee Furbish  Sonographer Comments: Technically difficult study due to poor echo windows. IMPRESSIONS  1. Severe biventricular dysfunction with at least mild-moderate secondary MR and TR.  2. Left ventricular ejection fraction, by estimation, is 20%. The left ventricle has severely decreased function. The left ventricle demonstrates global hypokinesis. There is mild left ventricular hypertrophy. Left ventricular diastolic parameters are indeterminate. There is the interventricular septum is flattened in systole and diastole, consistent with right ventricular pressure and volume overload.  3. Right ventricular systolic function is severely reduced. The right ventricular size is severely enlarged.  4. Left atrial size was mild to moderately dilated.  5. Right atrial size was severely dilated.  6. The mitral valve is normal in structure, and demonstrates tenting due to LV dysfunction. Mild to moderate mitral valve regurgitation. No evidence of mitral stenosis.  7. Tricuspid valve regurgitation is mild to moderate.  8. The aortic valve is grossly normal. There is mild calcification of the aortic valve. Aortic valve regurgitation is trivial. No aortic  stenosis is present. Conclusion(s)/Recommendation(s): No definite left ventricular mural or apical thrombus/thrombi. Swirling apical contrast. FINDINGS  Left Ventricle: Left ventricular ejection fraction, by estimation, is 20%. The left ventricle has severely decreased function. The left ventricle demonstrates global hypokinesis. Definity contrast agent was given IV to delineate the left ventricular endocardial borders. The left ventricular internal cavity size was normal in size. There is mild left ventricular hypertrophy. The interventricular septum is flattened in systole and diastole, consistent with right ventricular pressure and volume overload. Left ventricular diastolic parameters are indeterminate. Right Ventricle: The right ventricular size is severely enlarged. Right vetricular wall thickness was not well visualized. Right ventricular systolic function is severely reduced. Left Atrium: Left atrial size was mild to moderately dilated. Right Atrium: Right atrial size was severely dilated. Pericardium: There is no evidence of pericardial effusion. Mitral Valve: The mitral valve is normal in structure. Mild to moderate mitral valve regurgitation. No evidence of mitral valve stenosis. MV peak gradient, 3.6 mmHg. The mean mitral valve gradient is 1.0 mmHg. Tricuspid Valve: The tricuspid valve is normal in structure. Tricuspid valve regurgitation is mild to moderate. No evidence of tricuspid stenosis. Aortic Valve: The aortic valve is grossly normal. There is mild calcification of the aortic valve. Aortic valve regurgitation is trivial. No aortic stenosis is present. Aortic valve mean gradient measures 2.0 mmHg. Aortic valve peak gradient measures 3.5  mmHg. Aortic valve area, by VTI measures 1.61 cm. Pulmonic Valve: The pulmonic valve was not well visualized. Pulmonic valve regurgitation is trivial. No evidence of pulmonic stenosis. Aorta: The aortic root is normal in size and structure. Venous: The inferior  vena cava was not well visualized. IVC assessment for right atrial pressure unable to be performed due to mechanical ventilation. IAS/Shunts: No atrial level shunt detected by color flow Doppler.  LEFT VENTRICLE PLAX 2D LVIDd:         4.03 cm  Diastology LVIDs:         3.03 cm  LV e' medial:    4.73 cm/s LV PW:         1.30 cm  LV E/e' medial:  15.4 LV IVS:        1.30 cm  LV e' lateral:   6.25 cm/s LVOT diam:     1.80 cm  LV E/e' lateral: 11.7 LV SV:         24 LV SV Index:   11 LVOT Area:     2.54 cm  RIGHT VENTRICLE RV Basal diam:  4.74 cm RV Mid diam:    4.95 cm RV S prime:     4.00 cm/s TAPSE (M-mode): 0.9 cm LEFT  ATRIUM             Index       RIGHT ATRIUM           Index LA diam:        4.40 cm 2.10 cm/m  RA Area:     32.10 cm LA Vol (A2C):   80.4 ml 38.30 ml/m RA Volume:   121.00 ml 57.64 ml/m LA Vol (A4C):   74.2 ml 35.34 ml/m LA Biplane Vol: 78.4 ml 37.35 ml/m  AORTIC VALVE AV Area (Vmax):    1.64 cm AV Area (Vmean):   1.59 cm AV Area (VTI):     1.61 cm AV Vmax:           93.50 cm/s AV Vmean:          60.900 cm/s AV VTI:            0.147 m AV Peak Grad:      3.5 mmHg AV Mean Grad:      2.0 mmHg LVOT Vmax:         60.40 cm/s LVOT Vmean:        38.000 cm/s LVOT VTI:          0.093 m LVOT/AV VTI ratio: 0.63  AORTA Ao Root diam: 3.10 cm Ao Asc diam:  3.50 cm MITRAL VALVE               TRICUSPID VALVE MV Area (PHT): 3.53 cm    TR Peak grad:   23.6 mmHg MV Peak grad:  3.6 mmHg    TR Vmax:        243.00 cm/s MV Mean grad:  1.0 mmHg MV Vmax:       0.95 m/s    SHUNTS MV Vmean:      44.3 cm/s   Systemic VTI:  0.09 m MV Decel Time: 215 msec    Systemic Diam: 1.80 cm MV E velocity: 72.90 cm/s MV A velocity: 48.00 cm/s MV E/A ratio:  1.52 Cherlynn Kaiser MD Electronically signed by Cherlynn Kaiser MD Signature Date/Time: 03/16/2020/10:35:55 AM    Final     Cardiac Studies   2D Echo as above  Patient Profile     79 y.o. male with acute cholecystitis and PEA arrest, noted to have severe LV dysfunction  post-arrest and also elevated troponin consistent with demand ischemia of the myocardium  Assessment & Plan    1. Acute on chronic systolic heart failure with cardiogenic shock: off pressors. Multiple issues with AKI, afib, severe LV dysfunction. Seems to be slowly improving. Not a candidate for ACE/ARB/aldo antagonist/beta blocker because of bradycardia, AKI  2. Demand ischemia of the myocardium: known underlying CAD s/p CABG and PCI, with PEA arrest. Troponin peak at 1364 (in setting AKI) does not suggest large infarct. Medical therapy appropriate, not a candidate for invasive evaluation with cath.   3. Atrial fibrillation: slow ventricular rate this am. Suspect longstanding AF, but history limited. Pt has been on chronic warfarin. Stop IV amiodarone with slow ventricular rate. Add beta-blocker when HR/BP will allow. Continue heparin while critically ill.  Will follow with you.  For questions or updates, please contact [place] Please consult [email or web address] for contact info under     Signed, Sherren Mocha, MD  03/17/2020, 7:12 AM

## 2020-03-17 NOTE — Progress Notes (Signed)
ANTICOAGULATION CONSULT NOTE - Follow-Up Note  Pharmacy Consult for Heparin and Warfarin Indication: chest pain/ACS and atrial fibrillation  No Known Allergies  Patient Measurements: Height: 5\' 9"  (175.3 cm) Weight: 94.2 kg (207 lb 10.8 oz) IBW/kg (Calculated) : 70.7 Heparin Dosing Weight: 90.1 kg  Vital Signs: Temp: 98.9 F (37.2 C) (11/04 1132) Temp Source: Oral (11/04 1132) BP: 129/56 (11/04 1700) Pulse Rate: 63 (11/04 1700)  Labs: Recent Labs    03/15/20 0027 03/15/20 0643 03/15/20 1650 03/15/20 1705 03/15/20 1757 03/15/20 1802 03/15/20 1916 03/16/20 0008 03/16/20 0309 03/16/20 0309 03/16/20 0347 03/16/20 0521 03/16/20 2346 03/17/20 0357 03/17/20 0805 03/17/20 1638  HGB   < >  --  11.8*  --   --    < >  --   --  11.8*   < > 12.6*  --   --  11.1*  --   --   HCT   < >  --  36.8*  --   --    < >  --   --  35.8*  --  37.0*  --   --  33.2*  --   --   PLT   < >  --  142*  --   --   --   --   --  179  --   --   --   --  145*  --   --   APTT  --   --   --   --  35  --   --   --   --   --   --   --   --   --   --   --   LABPROT  --  17.4*  --   --  17.1*  --   --   --   --   --   --   --   --   --   --   --   INR  --  1.5*  --   --  1.5*  --   --   --   --   --   --   --   --   --   --   --   HEPARINUNFRC  --   --   --   --   --   --   --   --   --   --   --    < > 0.11*  --  0.17* 0.52  CREATININE   < >  --  2.53*  --   --   --   --   --  2.47*  --   --   --   --  2.51*  --   --   TROPONINIHS  --   --   --    < >  --    < > 308* 982* 1,364*  --   --   --   --   --   --   --    < > = values in this interval not displayed.    Estimated Creatinine Clearance: 27 mL/min (A) (by C-G formula based on SCr of 2.51 mg/dL (H)).   Medical History: Past Medical History:  Diagnosis Date  . Atrial fibrillation (HCC)    on Coumadin  . CAD (coronary artery disease)    s/p remote CABG, stent in 2019  . CHF (congestive heart failure) (HCC)   . Dementia (HCC)   .  Diabetes  mellitus   . DVT (deep venous thrombosis) (HCC)   . Fall at home 10/2015  . Hyperlipidemia   . Hypertension   . Left-sided carotid artery disease (HCC)   . MRSA infection   . Myocardial infarction (HCC)   . Obesity   . Peripheral vascular disease (HCC)   . Sleep apnea   . Stroke (HCC)   . Stroke (HCC)   . Venous insufficiency     Assessment: 79 yr old male with a history of PVD, CAD, HTN, HLD, DM and afib on warfarin presented with an NSTEMI. Pharmacy was consulted to dose heparin. PTA, the patient was on warfarin at 5 mg PO daily with an INR goal of 2-3. It is unknown whether or not the patient was therapeutic while on this dose.  Upon presentation on 11/1, INR was 2.7 and the patient received 10 mg of vitamin K in preparation for a perc chole tube. The perc chole tube was placed 11/3. Pharmacy is now consulted to dose warfarin. The patient will restart warfarin today and remain on the heparin infusion for at least 5 days and until the INR is therapeutic.  The patient's last INR was 1.5 on 11/2. Will follow changes in the patient's diet, in addition to the initiation of any interacting medications.  Heparin level ~6 hrs after heparin infusion was increased to 2050 units/hr was 0.52 units/ml, which is within the goal range for this pt. H/H 11.1/33.2, plt 145. Per RN, no issues with IV or bleeding observed.  Goal of Therapy:  INR 2-3 Heparin level 0.3-0.7 units/ml Monitor platelets by anticoagulation protocol: Yes   Plan:  Continue heparin infusion at 2050 units/hour Check confirmatory heparin level in 8 hrs Give warfarin 5 mg PO X 1 dose today - completed Monitor daily PT/INR, heparin level and CBC Monitor for signs/symptoms of bleeding  Vicki Mallet, PharmD, BCPS, [facility] Clinical Phamacist 03/17/2020 6:02 PM

## 2020-03-17 NOTE — Progress Notes (Addendum)
ANTICOAGULATION CONSULT NOTE - Follow-Up  Pharmacy Consult for heparin and warfarin Indication: chest pain/ACS and atrial fibrillation  No Known Allergies  Patient Measurements: Height: 5' 9" (175.3 cm) Weight: 94.2 kg (207 lb 10.8 oz) IBW/kg (Calculated) : 70.7 Heparin Dosing Weight: 90.1 kg  Vital Signs: Temp: 98.8 F (37.1 C) (11/04 0407) Temp Source: Oral (11/04 0407) BP: 128/58 (11/04 0802) Pulse Rate: 57 (11/04 0802)  Labs: Recent Labs    03/15/20 0027 03/15/20 1749 03/15/20 1650 03/15/20 1705 03/15/20 1757 03/15/20 1802 03/15/20 1916 03/16/20 0008 03/16/20 0309 03/16/20 0309 03/16/20 0347 03/16/20 0521 03/16/20 2346 03/17/20 0357 03/17/20 0805  HGB   < >  --  11.8*  --   --    < >  --   --  11.8*   < > 12.6*  --   --  11.1*  --   HCT   < >  --  36.8*  --   --    < >  --   --  35.8*  --  37.0*  --   --  33.2*  --   PLT   < >  --  142*  --   --   --   --   --  179  --   --   --   --  145*  --   APTT  --   --   --   --  35  --   --   --   --   --   --   --   --   --   --   LABPROT  --  17.4*  --   --  17.1*  --   --   --   --   --   --   --   --   --   --   INR  --  1.5*  --   --  1.5*  --   --   --   --   --   --   --   --   --   --   HEPARINUNFRC  --   --   --   --   --   --   --   --   --   --   --  <0.10* 0.11*  --  0.17*  CREATININE   < >  --  2.53*  --   --   --   --   --  2.47*  --   --   --   --  2.51*  --   TROPONINIHS  --   --   --    < >  --    < > 308* 982* 1,364*  --   --   --   --   --   --    < > = values in this interval not displayed.    Estimated Creatinine Clearance: 27 mL/min (A) (by C-G formula based on SCr of 2.51 mg/dL (H)).   Medical History: Past Medical History:  Diagnosis Date  . Atrial fibrillation ([place])    on Coumadin  . CAD (coronary artery disease)    s/p remote CABG, stent in 2019  . CHF (congestive heart failure) ([place])   . Dementia ([place])   . Diabetes mellitus   . DVT (deep venous thrombosis) ([place])   . Fall at home  10/2015  . Hyperlipidemia   . Hypertension   . Left-sided carotid artery  disease ([place])   . MRSA infection   . Myocardial infarction ([place])   . Obesity   . Peripheral vascular disease ([place])   . Sleep apnea   . Stroke ([place])   . Stroke ([place])   . Venous insufficiency     Medications:  Medications Prior to Admission  Medication Sig Dispense Refill Last Dose  . acetaminophen (TYLENOL) 500 MG tablet Take 500-1,000 mg by mouth every 6 (six) hours as needed for mild pain or headache.   unk  . allopurinol (ZYLOPRIM) 100 MG tablet TAKE 2 TABLETS BY MOUTH DAILY. (Patient taking differently: Take 100 mg by mouth 2 (two) times daily. ) 30 tablet 0 03/11/2020 at Unknown time  . colchicine 0.6 MG tablet Take 0.6 mg by mouth daily as needed (gout attacks).   unk  . ferrous sulfate 325 (65 FE) MG tablet Take 1 tablet (325 mg total) by mouth 2 (two) times daily with a meal. (Patient taking differently: Take 325 mg by mouth daily with breakfast. ) 90 tablet 0 03/11/2020 at am  . furosemide (LASIX) 40 MG tablet Take 1 tablet (40 mg total) by mouth 2 (two) times daily. (Patient taking differently: Take 40 mg by mouth in the morning. ) 90 tablet 0 03/11/2020 at am  . gabapentin (NEURONTIN) 300 MG capsule Take 300-600 mg by mouth See admin instructions. Take 300 mg by mouth three times a day and 600 mg at bedtime   03/11/2020 at Unknown time  . glipiZIDE (GLUCOTROL) 5 MG tablet Take 5 mg by mouth 2 (two) times daily before a meal.   03/11/2020 at Unknown time  . hydrALAZINE (APRESOLINE) 100 MG tablet Take 100 mg by mouth 2 (two) times daily.   03/11/2020 at Unknown time  . insulin aspart protamine- aspart (NOVOLOG MIX 70/30) (70-30) 100 UNIT/ML injection Inject 50 Units into the skin 2 (two) times daily before a meal.    03/11/2020 at Unknown time  . isosorbide mononitrate (IMDUR) 60 MG 24 hr tablet Take 1 tablet (60 mg total) by mouth daily. Appointment needed for future refills (Patient taking differently: Take  60 mg by mouth daily. ) 30 tablet 0 Past Week at Unknown time  . metolazone (ZAROXOLYN) 2.5 MG tablet Take 2.5 mg by mouth daily as needed (AS DIRECTED FOR MARKED SWELLING).   unk  . Multiple Vitamin (MULTIVITAMIN WITH MINERALS) TABS tablet Take 1 tablet by mouth daily.   03/11/2020 at Unknown time  . nitroGLYCERIN (NITROSTAT) 0.4 MG SL tablet Place 0.4 mg under the tongue every 5 (five) minutes as needed for chest pain.   unk  . pantoprazole (PROTONIX) 40 MG tablet Take 40 mg by mouth 2 (two) times daily before a meal.    03/11/2020 at Unknown time  . potassium chloride (K-DUR) 10 MEQ tablet Take 1 tablet (10 mEq total) by mouth daily. 30 tablet 5 03/11/2020 at Unknown time  . pravastatin (PRAVACHOL) 40 MG tablet Take 40 mg by mouth at bedtime.    03/11/2020 at pm  . PRESCRIPTION MEDICATION Inhale into the lungs See admin instructions. CPAP- At bedtime and during any time of rest   Past Week at Unknown time  . Psyllium (METAMUCIL PO) Take 2 capsules by mouth at bedtime.   03/11/2020 at Unknown time  . QUEtiapine (SEROQUEL) 50 MG tablet Take 50 mg by mouth at bedtime.    03/11/2020 at Unknown time  . warfarin (COUMADIN) 1 MG tablet Take 1 mg by mouth daily as needed (in conjunction with  the 5 mg strength, AS DIRECTED- depending on INR).    unk  . warfarin (COUMADIN) 5 MG tablet Take 5 mg by mouth daily with supper.   03/11/2020 at 1830  . docusate sodium (COLACE) 100 MG capsule Take 1 capsule (100 mg total) by mouth 2 (two) times daily. (Patient not taking: Reported on 03/14/2020) 10 capsule 0 Not Taking at Unknown time  . oxyCODONE (OXY IR/ROXICODONE) 5 MG immediate release tablet Take 1 tablet (5 mg total) by mouth every 4 (four) hours as needed (pain). (Patient not taking: Reported on 03/14/2020) 30 tablet 0 Not Taking at Unknown time   Scheduled:  . acetaminophen  650 mg Per Tube Q4H   Or  . acetaminophen (TYLENOL) oral liquid 160 mg/5 mL  650 mg Per Tube Q4H   Or  . acetaminophen  650 mg  Rectal Q4H  . allopurinol  100 mg Per Tube BID  . aspirin  81 mg Per Tube Daily  . atropine      . atropine      . chlorhexidine gluconate (MEDLINE KIT)  15 mL Mouth Rinse BID  . Chlorhexidine Gluconate Cloth  6 each Topical Daily  . docusate  100 mg Per Tube BID  . gabapentin  300 mg Per Tube Q12H  . insulin aspart  0-20 Units Subcutaneous Q4H  . insulin glargine  25 Units Subcutaneous BID  . mouth rinse  15 mL Mouth Rinse 10 times per day  . pantoprazole (PROTONIX) IV  40 mg Intravenous Q24H  . polyethylene glycol  17 g Per Tube Daily  . potassium chloride  40 mEq Oral Q4H  . pravastatin  40 mg Per Tube QHS  . QUEtiapine  50 mg Per Tube Q supper  . sodium chloride flush  3 mL Intravenous Q12H   Infusions:  . sodium chloride 20 mL/hr at 03/17/20 0830  . fentaNYL infusion INTRAVENOUS 75 mcg/hr (03/17/20 0600)  . heparin 1,700 Units/hr (03/17/20 0816)  . norepinephrine (LEVOPHED) Adult infusion Stopped (03/17/20 0356)  . piperacillin-tazobactam (ZOSYN)  IV 12.5 mL/hr at 03/17/20 0600   PRN: fentaNYL (SUBLIMAZE) injection, fentaNYL (SUBLIMAZE) injection, midazolam, midazolam, ondansetron **OR** ondansetron (ZOFRAN) IV Anti-infectives (From admission, onward)   Start     Dose/Rate Route Frequency Ordered Stop   03/15/20 2200  piperacillin-tazobactam (ZOSYN) IVPB 3.375 g        3.375 g 12.5 mL/hr over 240 Minutes Intravenous Every 8 hours 03/15/20 1755 03/20/20 2359   03/15/20 1000  ceFEPIme (MAXIPIME) 2 g in sodium chloride 0.9 % 100 mL IVPB  Status:  Discontinued        2 g 200 mL/hr over 30 Minutes Intravenous Every 24 hours 03/14/20 1020 03/15/20 0843   03/15/20 0930  cefTRIAXone (ROCEPHIN) 2 g in sodium chloride 0.9 % 100 mL IVPB  Status:  Discontinued        2 g 200 mL/hr over 30 Minutes Intravenous Every 24 hours 03/15/20 0844 03/15/20 1755   03/14/20 1600  metroNIDAZOLE (FLAGYL) IVPB 500 mg  Status:  Discontinued        500 mg 100 mL/hr over 60 Minutes Intravenous Every 8  hours 03/14/20 1427 03/15/20 1755   03/14/20 0800  ceFEPIme (MAXIPIME) 2 g in sodium chloride 0.9 % 100 mL IVPB        2 g 200 mL/hr over 30 Minutes Intravenous  Once 03/14/20 0752 03/14/20 0845   03/14/20 0800  metroNIDAZOLE (FLAGYL) IVPB 500 mg        500  mg 100 mL/hr over 60 Minutes Intravenous  Once 03/14/20 0752 03/14/20 0935      Assessment: 58 you male with a history of PVD, CAD, HTN, HLD, DM and afib on warfarin presents with an NSTEMI. Pharmacy was consulted to dose heparin. PTA the patient was on warfarin at 5 mg PO daily with an INR goal of 2-3. It is unknown whether or not the patient was therapeutic while on this dose.  Upon presentation on 11/1, INR was 2.7 and the patient received 10 mg of vitamin K in preparation for a perc chole tube. The perc chole tube was placed 11/3. Pharmacy is consulted now consulted to dose warfarin. The patient will restart warfarin today and remain on the heparin drip for at least 5 days and until the INR is therapeutic.  The heparin level this morning was subtherapeutic at 0.17. The RN denied any bleeding or issues with the infusion. The Hgb/Hct and platelets are stable. The patient's last INR was 1.5 on 11/2. Will follow changes in the patient's diet in addition to the initiation of any interacting medications.  Goal of Therapy:  INR 2-3 Heparin level 0.3-0.7 units/ml Monitor platelets by anticoagulation protocol: Yes   Plan:  Give warfarin 5 mg PO x1 dose tonight Increase heparin IV to 2050 units/hour Monitor for signs and symptoms of bleeding Obtain 6 hour heparin level Monitor daily PT/INR, heparin level and CBC   Shauna Hugh, PharmD, [place]  PGY-1 Pharmacy Resident 03/17/2020 10:06 AM  Please check [email or web address] for unit-specific pharmacy phone numbers.

## 2020-03-17 NOTE — Progress Notes (Signed)
General Surgery Follow Up Note  Subjective:    Overnight Issues:   Objective:  Vital signs for last 24 hours: Temp:  [98.8 F (37.1 C)-99 F (37.2 C)] 98.8 F (37.1 C) (11/04 0407) Pulse Rate:  [56-69] 57 (11/04 0802) Resp:  [16-34] 18 (11/04 0802) BP: (86-163)/(42-113) 128/58 (11/04 0802) SpO2:  [91 %-100 %] 100 % (11/04 0802) Arterial Line BP: (77-193)/(33-78) 125/42 (11/04 0600) FiO2 (%):  [40 %-100 %] 40 % (11/04 0802)  Hemodynamic parameters for last 24 hours:    Intake/Output from previous day: 11/03 0701 - 11/04 0700 In: 1225.6 [I.V.:1049.8; IV Piggyback:175.9] Out: 1190 [Urine:1050; Drains:140]  Intake/Output this shift: No intake/output data recorded.  Vent settings for last 24 hours: Vent Mode: PRVC FiO2 (%):  [40 %-100 %] 40 % Set Rate:  [18 bmp] 18 bmp Vt Set:  [560 mL] 560 mL PEEP:  [5 cmH20] 5 cmH20 Plateau Pressure:  [16 cmH20-21 cmH20] 21 cmH20  Physical Exam:  Gen: comfortable, no distress Neuro: non-focal exam, follows commands HEENT: PERRL Neck: supple CV: RRR Pulm: unlabored breathing on MV Abd: soft, NT, chole tube draining bilious fluid 140cc/24h GU: foley, UOP marginal Extr: wwp, no edema   Results for orders placed or performed during the hospital encounter of 03/14/20 (from the past 24 hour(s))  Glucose, capillary     Status: Abnormal   Collection Time: 03/16/20 12:30 PM  Result Value Ref Range   Glucose-Capillary 164 (H) 70 - 99 mg/dL  Glucose, capillary     Status: Abnormal   Collection Time: 03/16/20  4:21 PM  Result Value Ref Range   Glucose-Capillary 185 (H) 70 - 99 mg/dL  Aerobic/Anaerobic Culture (surgical/deep wound)     Status: None (Preliminary result)   Collection Time: 03/16/20  5:13 PM   Specimen: PATH Other; Bile  Result Value Ref Range   Specimen Description BILE    Special Requests NONE    Gram Stain      FEW WBC PRESENT,BOTH PMN AND MONONUCLEAR ABUNDANT GRAM POSITIVE COCCI Performed at [facility]  Lab, [address] [place], [place] [address]    Culture PENDING    Report Status PENDING   Glucose, capillary     Status: Abnormal   Collection Time: 03/16/20  7:42 PM  Result Value Ref Range   Glucose-Capillary 151 (H) 70 - 99 mg/dL  Glucose, capillary     Status: Abnormal   Collection Time: 03/16/20 11:19 PM  Result Value Ref Range   Glucose-Capillary 144 (H) 70 - 99 mg/dL  Heparin level (unfractionated)     Status: Abnormal   Collection Time: 03/16/20 11:46 PM  Result Value Ref Range   Heparin Unfractionated 0.11 (L) 0.30 - 0.70 IU/mL  Glucose, capillary     Status: Abnormal   Collection Time: 03/17/20  3:43 AM  Result Value Ref Range   Glucose-Capillary 130 (H) 70 - 99 mg/dL  Magnesium     Status: Abnormal   Collection Time: 03/17/20  3:57 AM  Result Value Ref Range   Magnesium 2.5 (H) 1.7 - 2.4 mg/dL  Basic metabolic panel     Status: Abnormal   Collection Time: 03/17/20  3:57 AM  Result Value Ref Range   Sodium 136 135 - 145 mmol/L   Potassium 3.0 (L) 3.5 - 5.1 mmol/L   Chloride 95 (L) 98 - 111 mmol/L   CO2 29 22 - 32 mmol/L   Glucose, Bld 132 (H) 70 - 99 mg/dL   BUN 91 (H)  8 - 23 mg/dL   Creatinine, Ser 4.09 (H) 0.61 - 1.24 mg/dL   Calcium 8.2 (L) 8.9 - 10.3 mg/dL   GFR, Estimated 25 (L) >60 mL/min   Anion gap 12 5 - 15  CBC     Status: Abnormal   Collection Time: 03/17/20  3:57 AM  Result Value Ref Range   WBC 9.6 4.0 - 10.5 K/uL   RBC 3.29 (L) 4.22 - 5.81 MIL/uL   Hemoglobin 11.1 (L) 13.0 - 17.0 g/dL   HCT 81.1 (L) 39 - 52 %   MCV 100.9 (H) 80.0 - 100.0 fL   MCH 33.7 26.0 - 34.0 pg   MCHC 33.4 30.0 - 36.0 g/dL   RDW 91.4 78.2 - 95.6 %   Platelets 145 (L) 150 - 400 K/uL   nRBC 0.0 0.0 - 0.2 %  Heparin level (unfractionated)     Status: Abnormal   Collection Time: 03/17/20  8:05 AM  Result Value Ref Range   Heparin Unfractionated 0.17 (L) 0.30 - 0.70 IU/mL  Glucose, capillary     Status: Abnormal   Collection Time: 03/17/20  8:08 AM  Result Value Ref Range    Glucose-Capillary 141 (H) 70 - 99 mg/dL    Assessment & Plan: The plan of care was discussed with the bedside nurse for the day, who is in agreement with this plan and no additional concerns were raised.   Present on Admission: . Sepsis due to undetermined organism (HCC) . Acute cholecystitis . Coronary artery disease . Chronic atrial fibrillation (HCC) . Essential hypertension . Hyperlipidemia . Type 2 diabetes mellitus with vascular disease (HCC) . Obstructive sleep apnea . CKD (chronic kidney disease), stage III (HCC) . Pressure injury of skin . ARF (acute renal failure) (HCC)    LOS: 3 days   Additional comments:I reviewed the patient's new clinical lab test results.   and I reviewed the patients new imaging test results.    CAD - followed by Dr. Allyson Sabal CHF - ECHO 07/2016 EF 55-65% A Fib on coumadin - INRdown to 1.5 (11/2) Hx DVT DM HTN HLD PVD Hx CVA Obesity BMI 33.89 AKI PEA arrest Shock VDRF  Acute cholecystitis - Patient with PEA arrest following intubation for perc chole 11/2. Perc chole placed 11/3, draining. Continue IV abx, may d/c from surgical standpoint after last dose on 11/7. Continue resuscitation per CCM.  ID -maxipime 11/1, flagyl 11/1>>11/2, rocephin 11/2>>11/2, zosyn 11/2>> VTE -heparin gtt FEN -IVF, OG to LIWS, recommend correction of electrolytes with repletion of potassium, consider checking phos level Foley -none Follow up -with Dr. Bedelia Person in the office  Diamantina Monks, MD Trauma & General Surgery Please use [email or web address] to contact on call provider  03/17/2020  *Care during the described time interval was provided by me. I have reviewed this patient's available data, including medical history, events of note, physical examination and test results as part of my evaluation.

## 2020-03-17 NOTE — Progress Notes (Signed)
Referring Physician(s): Dr. Dalene Seltzer  Supervising Physician: Ruel Favors  Patient Status:  [facility] - In-pt  Chief Complaint: Cholecystitis - S/p percutaneous cholecystostomy tube 03/16/20 by Dr. Archer Asa  Subjective: Patient in the ICU, intubated. Mildly restless/agitated. Eyes open, does not follow commands.   Allergies: Patient has no known allergies.  Medications: Prior to Admission medications   Medication Sig Start Date End Date Taking? Authorizing Provider  acetaminophen (TYLENOL) 500 MG tablet Take 500-1,000 mg by mouth every 6 (six) hours as needed for mild pain or headache.   Yes [provider]  allopurinol (ZYLOPRIM) 100 MG tablet TAKE 2 TABLETS BY MOUTH DAILY. Patient taking differently: Take 100 mg by mouth 2 (two) times daily.  05/03/14  Yes Runell Gess, MD  colchicine 0.6 MG tablet Take 0.6 mg by mouth daily as needed (gout attacks).   Yes [provider]  ferrous sulfate 325 (65 FE) MG tablet Take 1 tablet (325 mg total) by mouth 2 (two) times daily with a meal. Patient taking differently: Take 325 mg by mouth daily with breakfast.  08/09/16  Yes Ghimire, Werner Lean, MD  furosemide (LASIX) 40 MG tablet Take 1 tablet (40 mg total) by mouth 2 (two) times daily. Patient taking differently: Take 40 mg by mouth in the morning.  08/24/16  Yes Osvaldo Shipper, MD  gabapentin (NEURONTIN) 300 MG capsule Take 300-600 mg by mouth See admin instructions. Take 300 mg by mouth three times a day and 600 mg at bedtime   Yes [provider]  glipiZIDE (GLUCOTROL) 5 MG tablet Take 5 mg by mouth 2 (two) times daily before a meal.   Yes [provider]  hydrALAZINE (APRESOLINE) 100 MG tablet Take 100 mg by mouth 2 (two) times daily.   Yes [provider]  insulin aspart protamine- aspart (NOVOLOG MIX 70/30) (70-30) 100 UNIT/ML injection Inject 50 Units into the skin 2 (two) times daily before a meal.    Yes [provider]   isosorbide mononitrate (IMDUR) 60 MG 24 hr tablet Take 1 tablet (60 mg total) by mouth daily. Appointment needed for future refills Patient taking differently: Take 60 mg by mouth daily.  05/03/15  Yes Runell Gess, MD  metolazone (ZAROXOLYN) 2.5 MG tablet Take 2.5 mg by mouth daily as needed (AS DIRECTED FOR MARKED SWELLING).   Yes [provider]  Multiple Vitamin (MULTIVITAMIN WITH MINERALS) TABS tablet Take 1 tablet by mouth daily.   Yes [provider]  nitroGLYCERIN (NITROSTAT) 0.4 MG SL tablet Place 0.4 mg under the tongue every 5 (five) minutes as needed for chest pain.   Yes [provider]  pantoprazole (PROTONIX) 40 MG tablet Take 40 mg by mouth 2 (two) times daily before a meal.    Yes [provider]  potassium chloride (K-DUR) 10 MEQ tablet Take 1 tablet (10 mEq total) by mouth daily. 08/31/16  Yes Barrett, Joline Salt, PA-C  pravastatin (PRAVACHOL) 40 MG tablet Take 40 mg by mouth at bedtime.    Yes [provider]  PRESCRIPTION MEDICATION Inhale into the lungs See admin instructions. CPAP- At bedtime and during any time of rest   Yes [provider]  Psyllium (METAMUCIL PO) Take 2 capsules by mouth at bedtime.   Yes [provider]  QUEtiapine (SEROQUEL) 50 MG tablet Take 50 mg by mouth at bedtime.    Yes [provider]  warfarin (COUMADIN) 1 MG tablet Take 1 mg by mouth daily as needed (in  conjunction with the 5 mg strength, AS DIRECTED- depending on INR).  12/18/19  Yes [provider]  warfarin (COUMADIN) 5 MG tablet Take 5 mg by mouth daily with supper. 01/28/20  Yes [provider]  docusate sodium (COLACE) 100 MG capsule Take 1 capsule (100 mg total) by mouth 2 (two) times daily. Patient not taking: Reported on 03/14/2020 12/16/18   Jadene Pierini, MD  oxyCODONE (OXY IR/ROXICODONE) 5 MG immediate release tablet Take 1 tablet (5 mg total) by mouth every 4 (four) hours as needed  (pain). Patient not taking: Reported on 03/14/2020 12/16/18   Jadene Pierini, MD     Vital Signs: BP (!) 136/49   Pulse 65   Temp 98.9 F (37.2 C) (Oral)   Resp 15   Ht 5\' 9"  (1.753 m)   Wt 207 lb 10.8 oz (94.2 kg)   SpO2 100%   BMI 30.67 kg/m   Physical Exam Constitutional:      General: He is not in acute distress.    Appearance: He is ill-appearing.     Comments: Eyes open, restless. Does not respond to verbal commands.   Pulmonary:     Effort: Pulmonary effort is normal.     Comments: ETT/ventilator Abdominal:     Palpations: Abdomen is soft.     Comments: RUQ drain to gravity. Easily flushed. Approx. 15 ml of dark red fluid in bag. Dressing is clean and dry.   Skin:    General: Skin is warm and dry.  Neurological:     Mental Status: He is alert.     Imaging: CT ABDOMEN PELVIS WO CONTRAST  Result Date: 03/14/2020 CLINICAL DATA:  Right upper quadrant abdominal pain EXAM: CT ABDOMEN AND PELVIS WITHOUT CONTRAST TECHNIQUE: Multidetector CT imaging of the abdomen and pelvis was performed following the standard protocol without IV contrast. COMPARISON:  12/08/2018 FINDINGS: Motion degraded images. Lower chest: Trace bilateral pleural effusions. Associated lower lobe opacities, likely atelectasis. Hepatobiliary: Cirrhosis.  No focal hepatic lesion is seen. Distended gallbladder with layering gallstones (series 3/image 36). Mild pericholecystic stranding (series 3/image 40), raising concern for acute cholecystitis. No intrahepatic or extrahepatic ductal dilatation. Pancreas: Within normal limits. Spleen: Within normal limits. Adrenals/Urinary Tract: Adrenal glands are within normal limits. 2.3 cm medial right lower pole renal cyst (series 3/image 45). Bilateral kidneys are otherwise unremarkable, noting renal vascular calcifications. No hydronephrosis. Mildly thick-walled bladder, although underdistended. Stomach/Bowel: Stomach is within normal limits. No evidence of bowel  obstruction. Appendix is not discretely visualized. Scattered sigmoid diverticulosis, without evidence of diverticulitis. Vascular/Lymphatic: No evidence of abdominal aortic aneurysm. Extensive atherosclerotic calcifications of the abdominal aorta and branch vessels. No suspicious abdominopelvic lymphadenopathy. Reproductive: Prostate is unremarkable. Other: No abdominopelvic ascites. Musculoskeletal: Degenerative changes of the visualized thoracolumbar spine. Posterior thoracic fixation ending at L1, incompletely visualized. Median sternotomy. IMPRESSION: Motion degraded images. Distended gallbladder with layering gallstones and mild pericholecystic stranding, raising concern for acute cholecystitis. Additional ancillary findings as above. Electronically Signed   By: 12/10/2018 M.D.   On: 03/14/2020 09:50   CT Head Wo Contrast  Result Date: 03/14/2020 CLINICAL DATA:  Altered mental status/delirium EXAM: CT HEAD WITHOUT CONTRAST TECHNIQUE: Contiguous axial images were obtained from the base of the skull through the vertex without intravenous contrast. COMPARISON:  08/11/2016 FINDINGS: Brain: No evidence of acute infarction, hemorrhage, hydrocephalus, extra-axial collection or mass lesion/mass effect. Global cortical atrophy. Subcortical white matter and periventricular small vessel ischemic changes. Vascular: Intracranial atherosclerosis. Skull: Normal. Negative for fracture or  focal lesion. Sinuses/Orbits: The visualized paranasal sinuses are essentially clear. The mastoid air cells are unopacified. Other: None. IMPRESSION: No evidence of acute intracranial abnormality. Atrophy with small vessel ischemic changes. Electronically Signed   By: Charline Bills M.D.   On: 03/14/2020 09:36   IR Perc Cholecystostomy  Result Date: 03/16/2020 INDICATION: 79 year old male with acute calculus cholecystitis and florid sepsis. He presents for second attempt at percutaneous cholecystostomy tube placement after  being stabilized from acute cardiac arrest. EXAM: CHOLECYSTOSTOMY MEDICATIONS: In patient currently receiving intravenous Zosyn. No additional antibiotic prophylaxis administered. ANESTHESIA/SEDATION: Moderate (conscious) sedation was employed during this procedure. A total of Versed 1 mg and Fentanyl 25 mcg was administered intravenously. Moderate Sedation Time: 13 minutes minutes. The patient's level of consciousness and vital signs were monitored continuously by radiology nursing throughout the procedure under my direct supervision. FLUOROSCOPY TIME:  Fluoroscopy Time: 0 minutes 36 seconds (5 mGy). COMPLICATIONS: None immediate. PROCEDURE: Informed written consent was obtained from the patient after a thorough discussion of the procedural risks, benefits and alternatives. All questions were addressed. Maximal Sterile Barrier Technique was utilized including caps, mask, sterile gowns, sterile gloves, sterile drape, hand hygiene and skin antiseptic. A timeout was performed prior to the initiation of the procedure. The right upper quadrant was interrogated with ultrasound. Loops of colon can be seen interposed between the abdominal wall and liver. A suitable skin entry site that would facilitate a transhepatic access without transgressing the colon was identified. Local anesthesia was attained by infiltration with 1% lidocaine. A small dermatotomy was made. Under real-time ultrasound guidance, a 21 gauge Accustick needle was advanced through the liver and into the gallbladder lumen. The stylet was removed and there was return of dark brown bile. A 0.018 wire was advanced in the gallbladder lumen. The Accustick needle was then exchanged for the transitional Accustick sheath which was advanced into the gallbladder lumen. Contrast injection confirms opacification of the gallbladder lumen. A 0.035 J wire was then advanced through the Accustick sheath and into the gallbladder. The Accustick sheath was removed. The  transhepatic tract was percutaneously dilated to 10 [place]. A Cook 10.2 [place] all-purpose drainage catheter was advanced over the wire and formed in the gallbladder. Aspiration yields dark brown viscous bile. A sample was obtained and sent for culture. The catheter was gently flushed and connected to gravity bag drainage. The catheter was secured to the skin with 0 Prolene suture. IMPRESSION: Successful placement of percutaneous transhepatic cholecystostomy tube for acute calculus cholecystitis in a non operative candidate. Electronically Signed   By: Malachy Moan M.D.   On: 03/16/2020 16:19   DG CHEST PORT 1 VIEW  Result Date: 03/15/2020 CLINICAL DATA:  Endotracheal tube placement EXAM: PORTABLE CHEST 1 VIEW COMPARISON:  03/14/2020 FINDINGS: Cardiac shadow is enlarged but stable. Postsurgical changes are again seen. Endotracheal tube is noted in satisfactory position 3.5 cm above the carina. Gastric catheter extends into the stomach. Lungs are hypoinflated with minimal left basilar atelectasis. Mild vascular congestion is noted. No bony abnormality is seen. IMPRESSION: Endotracheal tube and gastric catheter in satisfactory position. Mild vascular congestion. Mild left basilar atelectasis new from the prior exam. Electronically Signed   By: Alcide Clever M.D.   On: 03/15/2020 18:39   DG Chest Portable 1 View  Result Date: 03/14/2020 CLINICAL DATA:  Shortness of breath and fatigue EXAM: PORTABLE CHEST 1 VIEW COMPARISON:  12/08/2018 FINDINGS: Signs of previous CABG procedure. Aortic atherosclerotic calcifications. Heart size normal. Small pleural effusions are suspected with blunting  of the costophrenic angles. Pulmonary vascular congestion. IMPRESSION: Suspect small bilateral pleural effusions and pulmonary vascular congestion. No signs of pneumonia. Electronically Signed   By: Signa Kellaylor  Stroud M.D.   On: 03/14/2020 07:02   ECHOCARDIOGRAM COMPLETE  Result Date: 03/16/2020    ECHOCARDIOGRAM REPORT    Patient Name:   Isaiah [name] Date of Exam: 03/16/2020 Medical Rec #:  [id number]      Height:       69.0 in Accession #:    [id number]     Weight:       207.7 lb Date of Birth:  28-Sep-1940      BSA:          2.099 m Patient Age:    79 years       BP:           114/64 mmHg Patient Gender: M              HR:           62 bpm. Exam Location:  Inpatient Procedure: 2D Echo, Cardiac Doppler, Color Doppler and Intracardiac            Opacification Agent Indications:    Cardiac arrest  History:        Patient has prior history of Echocardiogram examinations, most                 recent 08/09/2016. CHF, CAD, Arrythmias:Atrial Fibrillation; Risk                 Factors:Hypertension and Dyslipidemia. H/O DVT and CVA.  Sonographer:    Ross LudwigArthur Guy RDCS (AE) Referring Phys: [id number] Lorin GlassANIEL C SMITH  Sonographer Comments: Technically difficult study due to poor echo windows. IMPRESSIONS  1. Severe biventricular dysfunction with at least mild-moderate secondary MR and TR.  2. Left ventricular ejection fraction, by estimation, is 20%. The left ventricle has severely decreased function. The left ventricle demonstrates global hypokinesis. There is mild left ventricular hypertrophy. Left ventricular diastolic parameters are indeterminate. There is the interventricular septum is flattened in systole and diastole, consistent with right ventricular pressure and volume overload.  3. Right ventricular systolic function is severely reduced. The right ventricular size is severely enlarged.  4. Left atrial size was mild to moderately dilated.  5. Right atrial size was severely dilated.  6. The mitral valve is normal in structure, and demonstrates tenting due to LV dysfunction. Mild to moderate mitral valve regurgitation. No evidence of mitral stenosis.  7. Tricuspid valve regurgitation is mild to moderate.  8. The aortic valve is grossly normal. There is mild calcification of the aortic valve. Aortic valve regurgitation is trivial. No aortic  stenosis is present. Conclusion(s)/Recommendation(s): No definite left ventricular mural or apical thrombus/thrombi. Swirling apical contrast. FINDINGS  Left Ventricle: Left ventricular ejection fraction, by estimation, is 20%. The left ventricle has severely decreased function. The left ventricle demonstrates global hypokinesis. Definity contrast agent was given IV to delineate the left ventricular endocardial borders. The left ventricular internal cavity size was normal in size. There is mild left ventricular hypertrophy. The interventricular septum is flattened in systole and diastole, consistent with right ventricular pressure and volume overload. Left ventricular diastolic parameters are indeterminate. Right Ventricle: The right ventricular size is severely enlarged. Right vetricular wall thickness was not well visualized. Right ventricular systolic function is severely reduced. Left Atrium: Left atrial size was mild to moderately dilated. Right Atrium: Right atrial size was severely dilated. Pericardium: There is no evidence of  pericardial effusion. Mitral Valve: The mitral valve is normal in structure. Mild to moderate mitral valve regurgitation. No evidence of mitral valve stenosis. MV peak gradient, 3.6 mmHg. The mean mitral valve gradient is 1.0 mmHg. Tricuspid Valve: The tricuspid valve is normal in structure. Tricuspid valve regurgitation is mild to moderate. No evidence of tricuspid stenosis. Aortic Valve: The aortic valve is grossly normal. There is mild calcification of the aortic valve. Aortic valve regurgitation is trivial. No aortic stenosis is present. Aortic valve mean gradient measures 2.0 mmHg. Aortic valve peak gradient measures 3.5  mmHg. Aortic valve area, by VTI measures 1.61 cm. Pulmonic Valve: The pulmonic valve was not well visualized. Pulmonic valve regurgitation is trivial. No evidence of pulmonic stenosis. Aorta: The aortic root is normal in size and structure. Venous: The inferior  vena cava was not well visualized. IVC assessment for right atrial pressure unable to be performed due to mechanical ventilation. IAS/Shunts: No atrial level shunt detected by color flow Doppler.  LEFT VENTRICLE PLAX 2D LVIDd:         4.03 cm  Diastology LVIDs:         3.03 cm  LV e' medial:    4.73 cm/s LV PW:         1.30 cm  LV E/e' medial:  15.4 LV IVS:        1.30 cm  LV e' lateral:   6.25 cm/s LVOT diam:     1.80 cm  LV E/e' lateral: 11.7 LV SV:         24 LV SV Index:   11 LVOT Area:     2.54 cm  RIGHT VENTRICLE RV Basal diam:  4.74 cm RV Mid diam:    4.95 cm RV S prime:     4.00 cm/s TAPSE (M-mode): 0.9 cm LEFT ATRIUM             Index       RIGHT ATRIUM           Index LA diam:        4.40 cm 2.10 cm/m  RA Area:     32.10 cm LA Vol (A2C):   80.4 ml 38.30 ml/m RA Volume:   121.00 ml 57.64 ml/m LA Vol (A4C):   74.2 ml 35.34 ml/m LA Biplane Vol: 78.4 ml 37.35 ml/m  AORTIC VALVE AV Area (Vmax):    1.64 cm AV Area (Vmean):   1.59 cm AV Area (VTI):     1.61 cm AV Vmax:           93.50 cm/s AV Vmean:          60.900 cm/s AV VTI:            0.147 m AV Peak Grad:      3.5 mmHg AV Mean Grad:      2.0 mmHg LVOT Vmax:         60.40 cm/s LVOT Vmean:        38.000 cm/s LVOT VTI:          0.093 m LVOT/AV VTI ratio: 0.63  AORTA Ao Root diam: 3.10 cm Ao Asc diam:  3.50 cm MITRAL VALVE               TRICUSPID VALVE MV Area (PHT): 3.53 cm    TR Peak grad:   23.6 mmHg MV Peak grad:  3.6 mmHg    TR Vmax:        243.00 cm/s MV Mean grad:  1.0 mmHg MV Vmax:       0.95 m/s    SHUNTS MV Vmean:      44.3 cm/s   Systemic VTI:  0.09 m MV Decel Time: 215 msec    Systemic Diam: 1.80 cm MV E velocity: 72.90 cm/s MV A velocity: 48.00 cm/s MV E/A ratio:  1.52 Weston Brass MD Electronically signed by Weston Brass MD Signature Date/Time: 03/16/2020/10:35:55 AM    Final     Labs:  CBC: Recent Labs    03/15/20 0027 03/15/20 0027 03/15/20 1650 03/15/20 1650 03/15/20 1802 03/16/20 0309 03/16/20 0347 03/17/20 0357   WBC 17.1*  --  21.3*  --   --  14.6*  --  9.6  HGB 12.7*   < > 11.8*   < > 10.9* 11.8* 12.6* 11.1*  HCT 39.3   < > 36.8*   < > 32.0* 35.8* 37.0* 33.2*  PLT 127*  --  142*  --   --  179  --  145*   < > = values in this interval not displayed.    COAGS: Recent Labs    03/14/20 0911 03/15/20 0643 03/15/20 1757  INR 2.7* 1.5* 1.5*  APTT 44*  --  35    BMP: Recent Labs    03/15/20 0027 03/15/20 0027 03/15/20 1650 03/15/20 1650 03/15/20 1802 03/16/20 0309 03/16/20 0347 03/17/20 0357  NA 131*   < > 135   < > 140 131* 132* 136  K 3.0*   < > 3.3*   < > 2.6* 4.1 4.2 3.0*  CL 86*  --  90*  --   --  90*  --  95*  CO2 33*  --  34*  --   --  25  --  29  GLUCOSE 207*  --  198*  --   --  309*  --  132*  BUN 83*  --  85*  --   --  92*  --  91*  CALCIUM 9.2  --  8.4*  --   --  8.3*  --  8.2*  CREATININE 2.75*  --  2.53*  --   --  2.47*  --  2.51*  GFRNONAA 23*  --  25*  --   --  26*  --  25*   < > = values in this interval not displayed.    LIVER FUNCTION TESTS: Recent Labs    03/14/20 0743  BILITOT 1.1  AST 24  ALT 13  ALKPHOS 82  PROT 7.8  ALBUMIN 2.8*    Assessment and Plan:  Acute cholecystitis; s/p percutaneous cholecystostomy tube 03/16/20: 140 ml output documented in Epic, another 15 ml in gravity bag. Continue flushing drain and documenting output each shift. Keep dressing clean and dry. Patient will need cholangiogram and tube exchange in 6-8 weeks if gallbladder is not removed before that time.   Other plans per primary teams. IR will continue to follow.   Electronically Signed: Alwyn Ren, AGACNP-BC [phone] 03/17/2020, 12:26 PM   I spent a total of 15 Minutes at the the patient's bedside AND on the patient's hospital floor or unit, greater than 50% of which was counseling/coordinating care for percutaneous cholecystostomy tube care.

## 2020-03-17 NOTE — Progress Notes (Signed)
Pharmacist Heart Failure Core Measure Documentation  Assessment: Isaiah [name] has an EF documented as 20% on 11/2 by ECHO.  Rationale: Heart failure patients with left ventricular systolic dysfunction (LVSD) and an EF < 40% should be prescribed an angiotensin converting enzyme inhibitor (ACEI) or angiotensin receptor blocker (ARB) at discharge unless a contraindication is documented in the medical record.  This patient is not currently on an ACEI or ARB for HF.  This note is being placed in the record in order to provide documentation that a contraindication to the use of these agents is present for this encounter.  ACE Inhibitor or Angiotensin Receptor Blocker is contraindicated (specify all that apply)  []   ACEI allergy AND ARB allergy []   Angioedema []   Moderate or severe aortic stenosis []   Hyperkalemia [x]   Hypotension []   Renal artery stenosis [x]   Worsening renal function, preexisting renal disease or dysfunction  , PharmD, RPh  PGY-1 Pharmacy Resident 03/17/2020 8:00 AM  Please check [email or web address] for unit-specific pharmacy phone numbers.

## 2020-03-18 DIAGNOSIS — A419 Sepsis, unspecified organism: Secondary | ICD-10-CM | POA: Diagnosis not present

## 2020-03-18 LAB — BASIC METABOLIC PANEL
Anion gap: 12 (ref 5–15)
BUN: 78 mg/dL — ABNORMAL HIGH (ref 8–23)
CO2: 28 mmol/L (ref 22–32)
Calcium: 8.2 mg/dL — ABNORMAL LOW (ref 8.9–10.3)
Chloride: 98 mmol/L (ref 98–111)
Creatinine, Ser: 2.26 mg/dL — ABNORMAL HIGH (ref 0.61–1.24)
GFR, Estimated: 29 mL/min — ABNORMAL LOW (ref >60)
Glucose, Bld: 111 mg/dL — ABNORMAL HIGH (ref 70–99)
Potassium: 4 mmol/L (ref 3.5–5.1)
Sodium: 138 mmol/L (ref 135–145)

## 2020-03-18 LAB — CBC
HCT: 32.8 % — ABNORMAL LOW (ref 39.0–52.0)
Hemoglobin: 10.5 g/dL — ABNORMAL LOW (ref 13.0–17.0)
MCH: 33.7 pg (ref 26.0–34.0)
MCHC: 32 g/dL (ref 30.0–36.0)
MCV: 105.1 fL — ABNORMAL HIGH (ref 80.0–100.0)
Platelets: 143 10*3/uL — ABNORMAL LOW (ref 150–400)
RBC: 3.12 MIL/uL — ABNORMAL LOW (ref 4.22–5.81)
RDW: 14.3 % (ref 11.5–15.5)
WBC: 9.2 10*3/uL (ref 4.0–10.5)
nRBC: 0 % (ref 0.0–0.2)

## 2020-03-18 LAB — PROTIME-INR
INR: 1.5 — ABNORMAL HIGH (ref 0.8–1.2)
Prothrombin Time: 17.2 seconds — ABNORMAL HIGH (ref 11.4–15.2)

## 2020-03-18 LAB — GLUCOSE, CAPILLARY
Glucose-Capillary: 113 mg/dL — ABNORMAL HIGH (ref 70–99)
Glucose-Capillary: 89 mg/dL (ref 70–99)
Glucose-Capillary: 88 mg/dL (ref 70–99)
Glucose-Capillary: 116 mg/dL — ABNORMAL HIGH (ref 70–99)
Glucose-Capillary: 168 mg/dL — ABNORMAL HIGH (ref 70–99)
Glucose-Capillary: 119 mg/dL — ABNORMAL HIGH (ref 70–99)

## 2020-03-18 LAB — HEPARIN LEVEL (UNFRACTIONATED): Heparin Unfractionated: 0.34 IU/mL (ref 0.30–0.70)

## 2020-03-18 MED ORDER — AMOXICILLIN-POT CLAVULANATE 500-125 MG PO TABS
1.0000 | ORAL_TABLET | Freq: Two times a day (BID) | ORAL | Status: DC
  Filled 2020-03-18: qty 1

## 2020-03-18 MED ORDER — ACETAMINOPHEN 160 MG/5ML PO SOLN
650.0000 mg | ORAL | Status: DC
  Administered 2020-03-18 – 2020-04-05 (×74): 650 mg via ORAL
  Filled 2020-03-18 (×77): qty 20.3

## 2020-03-18 MED ORDER — PANTOPRAZOLE SODIUM 40 MG PO TBEC
40.0000 mg | DELAYED_RELEASE_TABLET | Freq: Two times a day (BID) | ORAL | Status: DC
  Administered 2020-03-18 – 2020-03-23 (×12): 40 mg via ORAL
  Filled 2020-03-18 (×12): qty 1

## 2020-03-18 MED ORDER — AMOXICILLIN-POT CLAVULANATE 250-62.5 MG/5ML PO SUSR
500.0000 mg | Freq: Two times a day (BID) | ORAL | Status: AC
  Administered 2020-03-18 – 2020-03-20 (×5): 500 mg via ORAL
  Filled 2020-03-18 (×6): qty 10

## 2020-03-18 MED ORDER — GABAPENTIN 250 MG/5ML PO SOLN
300.0000 mg | Freq: Two times a day (BID) | ORAL | Status: DC
  Administered 2020-03-18 – 2020-04-05 (×32): 300 mg via ORAL
  Filled 2020-03-18 (×42): qty 6

## 2020-03-18 MED ORDER — GABAPENTIN 300 MG PO CAPS
300.0000 mg | ORAL_CAPSULE | Freq: Two times a day (BID) | ORAL | Status: DC
  Administered 2020-03-18: 300 mg via ORAL
  Filled 2020-03-18: qty 1

## 2020-03-18 MED ORDER — DOCUSATE SODIUM 100 MG PO CAPS
100.0000 mg | ORAL_CAPSULE | Freq: Two times a day (BID) | ORAL | Status: DC
  Filled 2020-03-18: qty 1

## 2020-03-18 MED ORDER — WARFARIN SODIUM 5 MG PO TABS
5.0000 mg | ORAL_TABLET | Freq: Once | ORAL | Status: AC
  Administered 2020-03-18: 5 mg via ORAL
  Filled 2020-03-18: qty 1

## 2020-03-18 MED ORDER — DOCUSATE SODIUM 50 MG/5ML PO LIQD
100.0000 mg | Freq: Two times a day (BID) | ORAL | Status: DC
  Administered 2020-03-18 – 2020-03-23 (×3): 100 mg via ORAL
  Filled 2020-03-18 (×6): qty 10

## 2020-03-18 MED ORDER — ORAL CARE MOUTH RINSE
15.0000 mL | Freq: Two times a day (BID) | OROMUCOSAL | Status: DC
  Administered 2020-03-20 – 2020-04-05 (×15): 15 mL via OROMUCOSAL

## 2020-03-18 NOTE — Progress Notes (Signed)
Physical Therapy Treatment Patient Details Name: Isaiah [name] MRN: [id number] DOB: February 03, 1941 Today's Date: 03/18/2020    History of Present Illness Isaiah [name] is a 79 y.o. male with medical history significant of PVD; CAD; HTN; HLD; DM; afib on Coumadin; and CHF presenting with weakness and a buttock pressure ulcer. Pt with sepsis and R UQ abdominal pain, found to have cholecystitis. 11/2 Pt was undergoing anesthesia for a perc chole drain and coded. 2023-04-04 perc chole performed. Pt intubated 11/2-11/4    PT Comments    Pt initially slow to respond with noted brady down to 40 which lasted grossly 30 sec then returned to 70bpm. Pt oriented to self and place, did not assess time. Pt with delayed processing, decreased awareness of safety and function who is extremely stiff with all movement. He demonstrates virtually no trunk or pelvic rotation with bed mobility and rolling and required foot egress in bed to achieve sitting and standing with inability to weight shift in standing or maintain upright. Pt with significant kyphosis requiring 2 pillows to support head in supine as he could not extend neck and thoracic spine. Pt continues to require 2 person assist and be unable to safely tranfers OOB without lift equipment with D/C plan still appropriate.     Follow Up Recommendations  SNF;Supervision/Assistance - 24 hour     Equipment Recommendations  None recommended by PT    Recommendations for Other Services       Precautions / Restrictions Precautions Precautions: Fall Precaution Comments: incontinent of bowel, perc chole drain, sacral wound    Mobility  Bed Mobility Overal bed mobility: Needs Assistance Bed Mobility: Rolling;Supine to Sit;Sit to Supine Rolling: Total assist;+2 for physical assistance   Supine to sit: Total assist     General bed mobility comments: utilized foot egress positioning of bed to transition from supine<>sit. Total assist +2 to roll bil for  pericare  Transfers Overall transfer level: Needs assistance   Transfers: Sit to/from Stand Sit to Stand: Max assist         General transfer comment: initial stand from foot egress with max +2 assist with support of pad at sacrum and pt pushing on side rails. Pt then progressed to mod assist for 2nd 2 trials to stand and was able to hook therapist arm in standing to extend trunk and stand grossly 20 sec then 40 sec for pericare  Ambulation/Gait             General Gait Details: unable to attempt   Stairs             Wheelchair Mobility    Modified Rankin (Stroke Patients Only)       Balance Overall balance assessment: Needs assistance Sitting-balance support: Feet supported;Bilateral upper extremity supported Sitting balance-Leahy Scale: Fair Sitting balance - Comments: pt able to sit in foot egress position without UE support   Standing balance support: Bilateral upper extremity supported Standing balance-Leahy Scale: Poor Standing balance comment: bil knees blocked with UE support                            Cognition Arousal/Alertness: Awake/alert Behavior During Therapy: Flat affect Overall Cognitive Status: Impaired/Different from baseline Area of Impairment: Following commands;Problem solving;Attention;Memory;Safety/judgement                   Current Attention Level: Sustained Memory: Decreased short-term memory Following Commands: Follows one step commands with increased time;Follows one  step commands inconsistently Safety/Judgement: Decreased awareness of safety;Decreased awareness of deficits   Problem Solving: Slow processing;Decreased initiation;Difficulty sequencing;Requires verbal cues;Requires tactile cues General Comments: pt HOH, slow to respond and lacks awareness of deficits. Incontinent of bowel in standing and reports he knew but said nothing.      Exercises      General Comments        Pertinent  Vitals/Pain Pain Assessment: Faces Faces Pain Scale: Hurts little more Pain Location: generalized moaning with movement, at rest reports no pain, with mobility reports everywhere Pain Descriptors / Indicators: Aching;Moaning Pain Intervention(s): Limited activity within patient's tolerance;Monitored during session;Repositioned    Home Living                      Prior Function            PT Goals (current goals can now be found in the care plan section) Progress towards PT goals: Progressing toward goals    Frequency    Min 3X/week      PT Plan Current plan remains appropriate    Co-evaluation              AM-PAC PT "6 Clicks" Mobility   Outcome Measure  Help needed turning from your back to your side while in a flat bed without using bedrails?: Total Help needed moving from lying on your back to sitting on the side of a flat bed without using bedrails?: Total Help needed moving to and from a bed to a chair (including a wheelchair)?: Total Help needed standing up from a chair using your arms (e.g., wheelchair or bedside chair)?: A Lot Help needed to walk in hospital room?: Total Help needed climbing 3-5 steps with a railing? : Total 6 Click Score: 7    End of Session   Activity Tolerance: Patient tolerated treatment well Patient left: in bed;with call bell/phone within reach;with family/visitor present;with nursing/sitter in room Nurse Communication: Mobility status;Need for lift equipment PT Visit Diagnosis: Unsteadiness on feet (R26.81);Muscle weakness (generalized) (M62.81);Difficulty in walking, not elsewhere classified (R26.2)     Time: 5462-7035 PT Time Calculation (min) (ACUTE ONLY): 39 min  Charges:  $Therapeutic Activity: 38-52 mins                     Mouhamadou Gittleman P, PT Acute Rehabilitation Services Pager: [phone] Office: [phone]    Kirsten Spearing B Rosalee Tolley 03/18/2020, 10:13 AM

## 2020-03-18 NOTE — Progress Notes (Addendum)
ANTICOAGULATION CONSULT NOTE - Follow-Up  Pharmacy Consult for heparin and warfarin Indication: chest pain/ACS and atrial fibrillation  No Known Allergies  Patient Measurements: Height: '5\' 9"'  (175.3 cm) Weight: 98.5 kg (217 lb 2.5 oz) IBW/kg (Calculated) : 70.7 Heparin Dosing Weight: 90.1 kg  Vital Signs: Temp: 98.5 F (36.9 C) (11/05 0818) Temp Source: Axillary (11/05 0818) BP: 121/56 (11/05 0800) Pulse Rate: 65 (11/05 0800)  Labs: Recent Labs    03/15/20 1650 03/15/20 1757 03/15/20 1802 03/15/20 1916 03/16/20 0008 03/16/20 0309 03/16/20 0309 03/16/20 0347 03/16/20 0521 03/16/20 2346 03/17/20 0357 03/17/20 0805 03/17/20 1638 03/18/20 0229  HGB   < >  --    < >  --   --  11.8*   < > 12.6*   < >  --  11.1*  --   --  10.5*  HCT   < >  --    < >  --   --  35.8*   < > 37.0*  --   --  33.2*  --   --  32.8*  PLT   < >  --   --   --   --  179  --   --   --   --  145*  --   --  143*  APTT  --  35  --   --   --   --   --   --   --   --   --   --   --   --   LABPROT  --  17.1*  --   --   --   --   --   --   --   --   --   --   --  17.2*  INR  --  1.5*  --   --   --   --   --   --   --   --   --   --   --  1.5*  HEPARINUNFRC  --   --   --   --   --   --   --   --    < >   < >  --  0.17* 0.52 0.34  CREATININE   < >  --   --   --   --  2.47*  --   --   --   --  2.51*  --   --  2.26*  TROPONINIHS  --   --    < > 308* 982* 1,364*  --   --   --   --   --   --   --   --    < > = values in this interval not displayed.    Estimated Creatinine Clearance: 30.7 mL/min (A) (by C-G formula based on SCr of 2.26 mg/dL (H)).   Medical History: Past Medical History:  Diagnosis Date  . Atrial fibrillation ([place])    on Coumadin  . CAD (coronary artery disease)    s/p remote CABG, stent in 2019  . CHF (congestive heart failure) ([place])   . Dementia ([place])   . Diabetes mellitus   . DVT (deep venous thrombosis) ([place])   . Fall at home 10/2015  . Hyperlipidemia   . Hypertension   .  Left-sided carotid artery disease ([place])   . MRSA infection   . Myocardial infarction ([place])   . Obesity   . Peripheral vascular disease ([place])   .  Sleep apnea   . Stroke ([place])   . Stroke ([place])   . Venous insufficiency     Medications:  Medications Prior to Admission  Medication Sig Dispense Refill Last Dose  . acetaminophen (TYLENOL) 500 MG tablet Take 500-1,000 mg by mouth every 6 (six) hours as needed for mild pain or headache.   unk  . allopurinol (ZYLOPRIM) 100 MG tablet TAKE 2 TABLETS BY MOUTH DAILY. (Patient taking differently: Take 100 mg by mouth 2 (two) times daily. ) 30 tablet 0 03/11/2020 at Unknown time  . colchicine 0.6 MG tablet Take 0.6 mg by mouth daily as needed (gout attacks).   unk  . ferrous sulfate 325 (65 FE) MG tablet Take 1 tablet (325 mg total) by mouth 2 (two) times daily with a meal. (Patient taking differently: Take 325 mg by mouth daily with breakfast. ) 90 tablet 0 03/11/2020 at am  . furosemide (LASIX) 40 MG tablet Take 1 tablet (40 mg total) by mouth 2 (two) times daily. (Patient taking differently: Take 40 mg by mouth in the morning. ) 90 tablet 0 03/11/2020 at am  . gabapentin (NEURONTIN) 300 MG capsule Take 300-600 mg by mouth See admin instructions. Take 300 mg by mouth three times a day and 600 mg at bedtime   03/11/2020 at Unknown time  . glipiZIDE (GLUCOTROL) 5 MG tablet Take 5 mg by mouth 2 (two) times daily before a meal.   03/11/2020 at Unknown time  . hydrALAZINE (APRESOLINE) 100 MG tablet Take 100 mg by mouth 2 (two) times daily.   03/11/2020 at Unknown time  . insulin aspart protamine- aspart (NOVOLOG MIX 70/30) (70-30) 100 UNIT/ML injection Inject 50 Units into the skin 2 (two) times daily before a meal.    03/11/2020 at Unknown time  . isosorbide mononitrate (IMDUR) 60 MG 24 hr tablet Take 1 tablet (60 mg total) by mouth daily. Appointment needed for future refills (Patient taking differently: Take 60 mg by mouth daily. ) 30 tablet 0 Past Week at  Unknown time  . metolazone (ZAROXOLYN) 2.5 MG tablet Take 2.5 mg by mouth daily as needed (AS DIRECTED FOR MARKED SWELLING).   unk  . Multiple Vitamin (MULTIVITAMIN WITH MINERALS) TABS tablet Take 1 tablet by mouth daily.   03/11/2020 at Unknown time  . nitroGLYCERIN (NITROSTAT) 0.4 MG SL tablet Place 0.4 mg under the tongue every 5 (five) minutes as needed for chest pain.   unk  . pantoprazole (PROTONIX) 40 MG tablet Take 40 mg by mouth 2 (two) times daily before a meal.    03/11/2020 at Unknown time  . potassium chloride (K-DUR) 10 MEQ tablet Take 1 tablet (10 mEq total) by mouth daily. 30 tablet 5 03/11/2020 at Unknown time  . pravastatin (PRAVACHOL) 40 MG tablet Take 40 mg by mouth at bedtime.    03/11/2020 at pm  . PRESCRIPTION MEDICATION Inhale into the lungs See admin instructions. CPAP- At bedtime and during any time of rest   Past Week at Unknown time  . Psyllium (METAMUCIL PO) Take 2 capsules by mouth at bedtime.   03/11/2020 at Unknown time  . QUEtiapine (SEROQUEL) 50 MG tablet Take 50 mg by mouth at bedtime.    03/11/2020 at Unknown time  . warfarin (COUMADIN) 1 MG tablet Take 1 mg by mouth daily as needed (in conjunction with the 5 mg strength, AS DIRECTED- depending on INR).    unk  . warfarin (COUMADIN) 5 MG tablet Take 5 mg by mouth daily with  supper.   03/11/2020 at 1830  . docusate sodium (COLACE) 100 MG capsule Take 1 capsule (100 mg total) by mouth 2 (two) times daily. (Patient not taking: Reported on 03/14/2020) 10 capsule 0 Not Taking at Unknown time  . oxyCODONE (OXY IR/ROXICODONE) 5 MG immediate release tablet Take 1 tablet (5 mg total) by mouth every 4 (four) hours as needed (pain). (Patient not taking: Reported on 03/14/2020) 30 tablet 0 Not Taking at Unknown time   Scheduled:  . acetaminophen  650 mg Oral Q4H   Or  . acetaminophen  650 mg Rectal Q4H  . allopurinol  100 mg Oral BID  . aspirin  81 mg Oral Daily  . chlorhexidine gluconate (MEDLINE KIT)  15 mL Mouth Rinse  BID  . Chlorhexidine Gluconate Cloth  6 each Topical Daily  . docusate sodium  100 mg Oral BID  . gabapentin  300 mg Oral BID  . insulin aspart  0-20 Units Subcutaneous Q4H  . insulin glargine  25 Units Subcutaneous BID  . mouth rinse  15 mL Mouth Rinse 10 times per day  . pantoprazole  40 mg Oral BID  . polyethylene glycol  17 g Oral Daily  . QUEtiapine  50 mg Oral Q supper  . rosuvastatin  10 mg Oral QHS  . sodium chloride flush  3 mL Intravenous Q12H  . Warfarin - Pharmacist Dosing Inpatient   Does not apply q1600   Infusions:  . sodium chloride Stopped (03/17/20 2016)  . heparin 2,050 Units/hr (03/18/20 0800)  . piperacillin-tazobactam (ZOSYN)  IV 12.5 mL/hr at 03/18/20 0800   PRN: fentaNYL (SUBLIMAZE) injection, hydrALAZINE, ondansetron **OR** ondansetron (ZOFRAN) IV Anti-infectives (From admission, onward)   Start     Dose/Rate Route Frequency Ordered Stop   03/15/20 2200  piperacillin-tazobactam (ZOSYN) IVPB 3.375 g        3.375 g 12.5 mL/hr over 240 Minutes Intravenous Every 8 hours 03/15/20 1755 03/20/20 2359   03/15/20 1000  ceFEPIme (MAXIPIME) 2 g in sodium chloride 0.9 % 100 mL IVPB  Status:  Discontinued        2 g 200 mL/hr over 30 Minutes Intravenous Every 24 hours 03/14/20 1020 03/15/20 0843   03/15/20 0930  cefTRIAXone (ROCEPHIN) 2 g in sodium chloride 0.9 % 100 mL IVPB  Status:  Discontinued        2 g 200 mL/hr over 30 Minutes Intravenous Every 24 hours 03/15/20 0844 03/15/20 1755   03/14/20 1600  metroNIDAZOLE (FLAGYL) IVPB 500 mg  Status:  Discontinued        500 mg 100 mL/hr over 60 Minutes Intravenous Every 8 hours 03/14/20 1427 03/15/20 1755   03/14/20 0800  ceFEPIme (MAXIPIME) 2 g in sodium chloride 0.9 % 100 mL IVPB        2 g 200 mL/hr over 30 Minutes Intravenous  Once 03/14/20 0752 03/14/20 0845   03/14/20 0800  metroNIDAZOLE (FLAGYL) IVPB 500 mg        500 mg 100 mL/hr over 60 Minutes Intravenous  Once 03/14/20 0752 03/14/20 0935       Assessment: 23 you male with a history of PVD, CAD, HTN, HLD, DM and afib on warfarin presents with an NSTEMI. Pharmacy was consulted to dose heparin. PTA the patient was on warfarin at 5 mg PO daily with an INR goal of 2-3. It is unknown whether or not the patient was therapeutic while on this dose.  Upon presentation on 11/1, INR was 2.7 and the patient received  10 mg of vitamin K in preparation for a perc chole tube. The perc chole tube was placed 11/3. Pharmacy is consulted now consulted to dose warfarin. The patient will restart warfarin today and remain on the heparin drip for at least 5 days and until the INR is therapeutic.  The heparin level this morning was therapeutic at 0.34 while running at 2050 units/hr. The patient has now had two therapeutic heparin levels in a row. The RN denied any bleeding or issues with the infusion. The Hgb/Hct and platelets are stable. The patient's INR is subtherapeutic at 1.5. No new interacting medications have been started, but the patient is on antibiotics. The patient recently started a cardiac diet after being NPO.  Goal of Therapy:  INR 2-3 Heparin level 0.3-0.7 units/ml Monitor platelets by anticoagulation protocol: Yes   Plan:  Give warfarin 5 mg PO x1 dose tonight Continue heparin IV at 2050 units/hour Monitor for signs and symptoms of bleeding Monitor daily PT/INR, heparin level and CBC   Shauna Hugh, PharmD, [place]  PGY-1 Pharmacy Resident 03/18/2020 8:51 AM  Please check [email or web address] for unit-specific pharmacy phone numbers.

## 2020-03-18 NOTE — Progress Notes (Signed)
NAME:  Isaiah [name], MRN:  [id number], DOB:  1941/02/16, LOS: 4 ADMISSION DATE:  03/14/2020, CONSULTATION DATE:  03/15/2020 REFERRING MD:  Anesthesia, CHIEF COMPLAINT:  Code  blue  Brief History   79 y.o. M who was admitted for acute cholecystitis and taken to IR for Perc Chole tube placement and coded during the procedure obtain ROSC and transferred to ICU  Past Medical History  Atrial fibrillation (HCC), CAD (coronary artery disease), CHF (congestive heart failure) (HCC), Dementia (HCC), Diabetes mellitus, DVT (deep venous thrombosis) (HCC), Fall at home (10/2015), Hyperlipidemia, Hypertension, Left-sided carotid artery disease (HCC), MRSA infection, Myocardial infarction (HCC), Obesity, Peripheral vascular disease (HCC), Sleep apnea, Stroke (HCC), Stroke (HCC), and Venous insufficiency  Significant Hospital Events   11/1 Admit to hospitalists 11/2 To IR>code blue>PCCM consult 11/3 To IR for per chole drain placement Consults:  PCCM IR Surgery Cardiology Procedures:  11/2 ETT 11/3 Percutaneous cholecystostomy    Significant Diagnostic Tests:  11/1 CT head>> no acute findings, small vessel ischemic changes  11/1 CT abd/pelvis>>Distended gallbladder with layering gallstones and mild pericholecystic stranding, raising concern for acute cholecystitis.  Micro Data:  11/1 Covid and Flu>>negative 11/1 MRSA screen >> negative 11/2 BCx2>> 11/2 UC>> Negative  Antimicrobials:  Cefepime 11/1 Ceftriaxone 11/2 Flagyl 11/1-11/2 Zosyn 11/2-   Interim history/subjective:  Overnight patient with an episode of bradycardia to the 30s, asymptomatic resting in bed at that time. Spontaneously resolved without intervention. This morning patient is feeling well, state he feels some fullness . States he is thirsty without much appetite. Denies chest pain, shortness of breath. Would like to work with PT this morning.  Objective   Blood pressure (!) 114/59, pulse (!) 59, temperature 98.1 F (36.7  C), temperature source Oral, resp. rate 16, height 5\' 9"  (1.753 m), weight 98.5 kg, SpO2 94 %.    Vent Mode: PSV;CPAP FiO2 (%):  [40 %] 40 % PEEP:  [5 cmH20] 5 cmH20 Pressure Support:  [5 cmH20-10 cmH20] 5 cmH20   Intake/Output Summary (Last 24 hours) at 03/18/2020 0804 Last data filed at 03/18/2020 0600 Gross per 24 hour  Intake 1104.44 ml  Output 1090 ml  Net 14.44 ml   Filed Weights   03/14/20 0810 03/14/20 2205 03/18/20 0400  Weight: 95.3 kg 94.2 kg 98.5 kg    Examination: General: Overweight male, awake, no acute distress HENT: ETT in place Lungs: clear to ascultation anteriorly  Cardiovascular: Normal S1 and S2, irregular rhythm normal rate, no JVD Abdomen: Soft, RUG tenderness to palpation, perc chole drailn in place with bilious drainage,  mildly distended normal bowel sounds, incision site clean dressed no bleeding Extremities: warm 1+ edema bilaterally Neuro: Awake and oriented x3 Skin: bilateral decubitus ulcers of buttocks, clean dressed  Resolved Hospital Problem list     Assessment & Plan:   Neurologic: Acute encephalopathy secondary to sepsis Patient awake and alert this morning, oriented x3 and appropriately answering questions and responding to questions - PT   Cardiovascular: Type II NSTEMI in setting of cardiac arrest and CAD s/p CABG and PCI with stents Patient with significant cardiac history with CABG in 2018. Elevated troponin x3 after cardiac arrest and ischemic EKG changes. TTE with redueced EF of 20% and RV dilation. -Cardiology consulted no recommending invasive internvention  -ASA 81 mg -Continue heparin drip, currently bridging to warfarin for afib -Start rosuvastatin 10 mg -Will hold of on starting ACE/ARB/ARNI in the setting of AKI and BB for bradycardia  Hypovolemic shock in setting of sepsis Septic  cholecystitis likely source. Off pressors. Maintaining MAP > 65.  - continue Zosyn until 03/20/2020 - Hydralazine 5 mg IV PRN for SBP  >160 - hold lasix and metolazone   Acute on chronic systolic heart failure Patient feeing will without SOB. Exam significant for 4 kg increase in weight, no JVD, chest CTA without signifcant LE edema. Does not appear fluid overloaded. TTE with LVEF of 20% and global hypokineses. Mild LVH, decreased RV systolic function with severe ventricular enlargement. Right atrium severely dilated, mild dilation of left atrium. Mild to moderate MR and TR.  -Not on beta blocker or angiotension system blockade prior, consider starting if kidney function and HR permits - Has not seen cardiologist since 2018 will need to follow up as an outpatient  Atrial fibrillation Currently rate controled HR in 60s. One episode of bradycardia overnight resolved without intervention. Intermittent PVCs and afib on monitor. Amiodarone d/c on 11/4 n setting of bradycardia.  - Warfarin per pharmacy INR 1.5 today - Not on BB at home due to bradycardia, consider starting on BB if HR permits  Respiratory: Acute hypoxic resipiratory failure secondary to sepsis, underlying OSA, and cardiac arrest - Extubated yesterday, tolerating well. Saturating 97% on room air  Gastrointestinal:  Acute cholecystitis  Per chole drain placed 11/3 draining bilious fluid - On day 4 of Zosyn plan to continue until 11/7  Renal AKI Cr. downtrending  to 2.26 today. K 4.0  - Continue to monitor kidney function  Endocrine: Diabetes  CBG  Between 80-120. On oral diet but with poor appetite. Poorly controlled diabetes with A1c of 12.1. Home regimen glipizide 5 mg BID and Novolog 70/30 50 units twice daily.  - CBG monitoring, will adjust insulin if he glucose remains low - Continue lantus 25 units twice daily - SSI resistant   Skin: Bilateral buttock stage II pressure ulcers -continue wound care  GU Urinary retention Patient requiring intermittent  in and out caths prior to procedure on the 11/3. Foley placed for perc cholecystostomy and  removed yesterday but has been c/o suprapubic fullness. -Repeat bladder scan and in and out cath -Monitor I/O, may need to start foley if he continues to retain  Best practice:  Diet: Heart healthy carb modified DVT prophylaxis: Heparin GI prophylaxis: PPI Glucose control: SSI Mobility: Sit up in bed, PT to work with patient Code Status: Full Family Communication: Discussed plan with daughter at bedside  Disposition: Off sedation and pressors safe for transfer to [facility] with triad to assume care  Labs   CBC: Recent Labs  Lab 03/14/20 0743 03/14/20 0743 03/15/20 0027 03/15/20 0027 03/15/20 1650 03/15/20 1650 03/15/20 1802 03/16/20 0309 03/16/20 0347 03/17/20 0357 03/18/20 0229  WBC 21.2*   < > 17.1*  --  21.3*  --   --  14.6*  --  9.6 9.2  NEUTROABS 18.6*  --   --   --   --   --   --   --   --   --   --   HGB 13.9   < > 12.7*   < > 11.8*   < > 10.9* 11.8* 12.6* 11.1* 10.5*  HCT 43.4   < > 39.3   < > 36.8*   < > 32.0* 35.8* 37.0* 33.2* 32.8*  MCV 105.3*   < > 104.0*  --  105.1*  --   --  100.8*  --  100.9* 105.1*  PLT 141*   < > 127*  --  142*  --   --  179  --  145* 143*   < > = values in this interval not displayed.    Basic Metabolic Panel: Recent Labs  Lab 03/15/20 0027 03/15/20 0027 03/15/20 1650 03/15/20 1650 03/15/20 1757 03/15/20 1802 03/16/20 0309 03/16/20 0347 03/17/20 0357 03/18/20 0229  NA 131*   < > 135   < >  --  140 131* 132* 136 138  K 3.0*   < > 3.3*   < >  --  2.6* 4.1 4.2 3.0* 4.0  CL 86*  --  90*  --   --   --  90*  --  95* 98  CO2 33*  --  34*  --   --   --  25  --  29 28  GLUCOSE 207*  --  198*  --   --   --  309*  --  132* 111*  BUN 83*  --  85*  --   --   --  92*  --  91* 78*  CREATININE 2.75*  --  2.53*  --   --   --  2.47*  --  2.51* 2.26*  CALCIUM 9.2  --  8.4*  --   --   --  8.3*  --  8.2* 8.2*  MG 2.6*  --   --   --  2.5*  --  2.3  --  2.5*  --   PHOS  --   --   --   --   --   --  3.7  --   --   --    < > = values in this interval  not displayed.   GFR: Estimated Creatinine Clearance: 30.7 mL/min (A) (by C-G formula based on SCr of 2.26 mg/dL (H)). Recent Labs  Lab 03/14/20 0743 03/14/20 0818 03/14/20 2125 03/15/20 0027 03/15/20 0027 03/15/20 1650 03/15/20 1757 03/15/20 2231 03/16/20 0309 03/17/20 0357 03/18/20 0229  PROCALCITON  --  7.85  --   --   --   --   --   --   --   --   --   WBC   < >  --   --  17.1*   < > 21.3*  --   --  14.6* 9.6 9.2  LATICACIDVEN   < >  --  1.2 0.9  --   --  2.6* 2.8*  --   --   --    < > = values in this interval not displayed.    Liver Function Tests: Recent Labs  Lab 03/14/20 0743  AST 24  ALT 13  ALKPHOS 82  BILITOT 1.1  PROT 7.8  ALBUMIN 2.8*   Recent Labs  Lab 03/14/20 0818  LIPASE 35   Recent Labs  Lab 03/14/20 0744  AMMONIA 17    ABG    Component Value Date/Time   PHART 7.562 (H) 03/16/2020 0347   PCO2ART 36.4 03/16/2020 0347   PO2ART 189 (H) 03/16/2020 0347   HCO3 32.6 (H) 03/16/2020 0347   TCO2 34 (H) 03/16/2020 0347   O2SAT 100.0 03/16/2020 0347     Coagulation Profile: Recent Labs  Lab 03/14/20 0911 03/15/20 0643 03/15/20 1757 03/18/20 0229  INR 2.7* 1.5* 1.5* 1.5*    Cardiac Enzymes: Results for NAZIER, NEYHART (MRN [id number]) as of 03/16/2020 07:09  Ref. Range 03/15/2020 17:05 03/15/2020 19:16 03/16/2020 00:08 03/16/2020 03:09  Troponin I (High Sensitivity) Latest Ref Range: <18 ng/L 61 (H) 308 (HH) 982 (HH) 1,364 (HH)  HbA1C: Hgb A1c MFr Bld  Date/Time Value Ref Range Status  03/14/2020 09:25 PM 12.1 (H) 4.8 - 5.6 % Final    Comment:    (NOTE) Pre diabetes:          5.7%-6.4%  Diabetes:              >6.4%  Glycemic control for   <7.0% adults with diabetes   12/12/2018 09:01 PM 10.0 (H) 4.8 - 5.6 % Final    Comment:    (NOTE) Pre diabetes:          5.7%-6.4% Diabetes:              >6.4% Glycemic control for   <7.0% adults with diabetes     CBG: Recent Labs  Lab 03/17/20 1130 03/17/20 1542 03/17/20 1938  03/17/20 2352 03/18/20 0326  GLUCAP 136* 112* 98 80 113*   Past Medical History  He,  has a past medical history of Atrial fibrillation (HCC), CAD (coronary artery disease), CHF (congestive heart failure) (HCC), Dementia (HCC), Diabetes mellitus, DVT (deep venous thrombosis) (HCC), Fall at home (10/2015), Hyperlipidemia, Hypertension, Left-sided carotid artery disease (HCC), MRSA infection, Myocardial infarction (HCC), Obesity, Peripheral vascular disease (HCC), Sleep apnea, Stroke (HCC), Stroke (HCC), and Venous insufficiency.   Surgical History    Past Surgical History:  Procedure Laterality Date  . CORONARY ARTERY BYPASS GRAFT  1997   vein harvest from right leg  . CORONARY STENT INTERVENTION N/A 08/07/2016   Procedure: Coronary Stent Intervention;  Surgeon: Corky CraftsJayadeep S Varanasi, MD;  [facility];  Service: Cardiovascular;  Laterality: N/A;  RCA  . ESOPHAGOGASTRODUODENOSCOPY N/A 08/12/2016   Procedure: ESOPHAGOGASTRODUODENOSCOPY (EGD);  Surgeon: Carman ChingJames Edwards, MD;  [facility];  Service: Endoscopy;  Laterality: N/A;  . INGUINAL HERNIA REPAIR  1980's   Bilateral,  [place]  . IR PERC CHOLECYSTOSTOMY  03/16/2020  . LEFT HEART CATH AND CORS/GRAFTS ANGIOGRAPHY N/A 08/07/2016   Procedure: Left Heart Cath and Cors/Grafts Angiography;  Surgeon: Corky CraftsJayadeep S Varanasi, MD;  [facility];  Service: Cardiovascular;  Laterality: N/A;  . PR VEIN BYPASS GRAFT,AORTO-FEM-POP  1980   FEM-FEM BPG by Dr. Orson SlickBowman  . RADIOLOGY WITH ANESTHESIA N/A 03/15/2020   Procedure: IR WITH ANESTHESIA;  Surgeon: Irish LackYamagata, Glenn, MD;  [facility];  Service: Radiology;  Laterality: N/A;     Social History   reports that he quit smoking about 41 years ago. His smoking use included cigarettes. He has never used smokeless tobacco. He reports that he does not drink alcohol and does not use drugs.   Family History   His family history includes Cancer in his father;  Diabetes in his daughter, father, and sister; Heart attack in his brother and father; Heart disease in his father, mother, and sister; Hyperlipidemia in his brother, daughter, father, mother, and sister; Hypertension in his brother, daughter, father, mother, sister, and son; Other in his brother, daughter, father, mother, and sister; Varicose Veins in his daughter, mother, and sister.   Allergies No Known Allergies   Home Medications  Prior to Admission medications   Medication Sig Start Date End Date Taking? Authorizing Provider  acetaminophen (TYLENOL) 500 MG tablet Take 500-1,000 mg by mouth every 6 (six) hours as needed for mild pain or headache.   Yes [provider]  allopurinol (ZYLOPRIM) 100 MG tablet TAKE 2 TABLETS BY MOUTH DAILY. Patient taking differently: Take 100 mg by mouth 2 (two) times daily.  05/03/14  Yes Runell Gess, MD  colchicine 0.6 MG tablet Take 0.6 mg by mouth daily as needed (gout attacks).   Yes [provider]  ferrous sulfate 325 (65 FE) MG tablet Take 1 tablet (325 mg total) by mouth 2 (two) times daily with a meal. Patient taking differently: Take 325 mg by mouth daily with breakfast.  08/09/16  Yes Ghimire, Werner Lean, MD  furosemide (LASIX) 40 MG tablet Take 1 tablet (40 mg total) by mouth 2 (two) times daily. Patient taking differently: Take 40 mg by mouth in the morning.  08/24/16  Yes Osvaldo Shipper, MD  gabapentin (NEURONTIN) 300 MG capsule Take 300-600 mg by mouth See admin instructions. Take 300 mg by mouth three times a day and 600 mg at bedtime   Yes [provider]  glipiZIDE (GLUCOTROL) 5 MG tablet Take 5 mg by mouth 2 (two) times daily before a meal.   Yes [provider]  hydrALAZINE (APRESOLINE) 100 MG tablet Take 100 mg by mouth 2 (two) times daily.   Yes [provider]  insulin aspart protamine- aspart (NOVOLOG MIX 70/30) (70-30) 100 UNIT/ML injection Inject 50 Units into the skin 2 (two) times daily  before a meal.    Yes [provider]  isosorbide mononitrate (IMDUR) 60 MG 24 hr tablet Take 1 tablet (60 mg total) by mouth daily. Appointment needed for future refills Patient taking differently: Take 60 mg by mouth daily.  05/03/15  Yes Runell Gess, MD  metolazone (ZAROXOLYN) 2.5 MG tablet Take 2.5 mg by mouth daily as needed (AS DIRECTED FOR MARKED SWELLING).   Yes [provider]  Multiple Vitamin (MULTIVITAMIN WITH MINERALS) TABS tablet Take 1 tablet by mouth daily.   Yes [provider]  nitroGLYCERIN (NITROSTAT) 0.4 MG SL tablet Place 0.4 mg under the tongue every 5 (five) minutes as needed for chest pain.   Yes [provider]  pantoprazole (PROTONIX) 40 MG tablet Take 40 mg by mouth 2 (two) times daily before a meal.    Yes [provider]  potassium chloride (K-DUR) 10 MEQ tablet Take 1 tablet (10 mEq total) by mouth daily. 08/31/16  Yes Barrett, Joline Salt, PA-C  pravastatin (PRAVACHOL) 40 MG tablet Take 40 mg by mouth at bedtime.    Yes [provider]  PRESCRIPTION MEDICATION Inhale into the lungs See admin instructions. CPAP- At bedtime and during any time of rest   Yes [provider]  Psyllium (METAMUCIL PO) Take 2 capsules by mouth at bedtime.   Yes [provider]  QUEtiapine (SEROQUEL) 50 MG tablet Take 50 mg by mouth at bedtime.    Yes [provider]  warfarin (COUMADIN) 1 MG tablet Take 1 mg by mouth daily as needed (in conjunction with the 5 mg strength, AS DIRECTED- depending on INR).  12/18/19  Yes [provider]  warfarin (COUMADIN) 5 MG tablet Take 5 mg by mouth daily with supper. 01/28/20  Yes [provider]  docusate sodium (COLACE) 100 MG capsule Take 1 capsule (100 mg total) by mouth 2 (two) times daily. Patient not taking: Reported on 03/14/2020 12/16/18   Jadene Pierini, MD  oxyCODONE (OXY IR/ROXICODONE) 5 MG immediate release tablet Take 1 tablet (5 mg total) by  mouth every 4 (four) hours as needed (pain). Patient not taking: Reported on 03/14/2020 12/16/18   Jadene Pierini, MD     Quincy Simmonds, MD PGY 1 [facility]  03/18/20 7:25 AM Pager [phone]

## 2020-03-18 NOTE — Progress Notes (Signed)
Inpatient Diabetes Program Recommendations  AACE/ADA: New Consensus Statement on Inpatient Glycemic Control (2015)  Target Ranges:  Prepandial:   less than 140 mg/dL      Peak postprandial:   less than 180 mg/dL (1-2 hours)      Critically ill patients:  140 - 180 mg/dL   Lab Results  Component Value Date   GLUCAP 89 03/18/2020   HGBA1C 12.1 (H) 03/14/2020    Review of Glycemic Control  Results for LANDER, ESLICK (MRN [id number]) as of 03/18/2020 10:59  Ref. Range 03/17/2020 15:42 03/17/2020 19:38 03/17/2020 23:52 03/18/2020 03:26 03/18/2020 08:21  Glucose-Capillary Latest Ref Range: 70 - 99 mg/dL 811 (H) 98 80 031 (H) 89   Diabetes history: DM 2 Outpatient Diabetes medications:  Novolog 70/30 mix 50 units bid Glucotrol 5 mg bid Current orders for Inpatient glycemic control:  Novolog resistant q 4 hours Lantus 25 units bid Inpatient Diabetes Program Recommendations:     Consider reducing Novolog correction to moderate q 4 hours and reduce Lantus to 20 units bid.   Thanks  Beryl Meager, RN, BC-ADM Inpatient Diabetes Coordinator Pager [phone] (8a-5p)

## 2020-03-18 NOTE — Progress Notes (Signed)
Nutrition Follow-up  DOCUMENTATION CODES:   Not applicable  INTERVENTION:   - If PO intake does not improve, recommend liberalizing diet to Regular to allow for more food options  - Ensure Enlive po TID, each supplement provides 350 kcal and 20 grams of protein  - Strawberry yogurt TID with meal trays, RD to order via [employer] diet software  - Encourage adequate PO intake  NUTRITION DIAGNOSIS:   Increased nutrient needs related to wound healing as evidenced by estimated needs.  Ongoing  GOAL:   Patient will meet greater than or equal to 90% of their needs  Progressing  MONITOR:   PO intake, Supplement acceptance, Labs, Weight trends, I & O's, Skin  REASON FOR ASSESSMENT:   Ventilator    ASSESSMENT:   79 year old male who presented to the ED on 11/10 with generalized weakness. PMH of CAD, CHF, atrial fibrillation, DVT, HTN, HLD, PVD, CVA, thoracic back surgery last year, pressure injury. Pt admitted with sepsis due to acute cholecystitis.  11/02 - brief cardiac arrest in IR, intubated 11/03 - s/p percutaneous cholecystostomy tube placement 11/04 - extubated, diet advanced  Discussed pt with RN and during ICU rounds. Pt does not have much of an appetite this morning so RN provided an Ensure Enlive. RD will order Ensure Enlive TID and monitor PO intake.  Spoke with pt and daughter at bedside. Pt and daughter confirm that pt does not have much of an appetite. Noted an untouched breakfast meal tray at bedside. Daughter confirms pt did not eat any breakfast but did drink a few sips of Ensure Enlive.  Discussed with pt and daughter the importance of adequate PO intake in healing and maintaining lean muscle mass. Pt and daughter express understanding. Pt reports that he likes strawberry yogurt. Will order strawberry yogurt to come TID with pt's meal trays.  Medications reviewed and include: colace, SSI q 4 hours, lantus 25 units BID, IV protonix, miralax, warfarin,  heparin, IV abx  Labs reviewed: BUN 78, creatinine 2.26 CBG's: 80-136 x 24 hours  UOP: 1135 ml x 24 hours Perc chole drain: 80 ml x 24 hours I/O's: +4.2 L since admit  NUTRITION - FOCUSED PHYSICAL EXAM:  Unable to complete at this time. RD working remotely.  Diet Order:   Diet Order            Diet heart healthy/carb modified Room service appropriate? Yes; Fluid consistency: Thin  Diet effective now                 EDUCATION NEEDS:   Education needs have been addressed  Skin:  Skin Assessment: Skin Integrity Issues: Stage II: bilateral buttocks  Last BM:  03/18/20  Height:   Ht Readings from Last 1 Encounters:  03/15/20 5\' 9"  (1.753 m)    Weight:   Wt Readings from Last 1 Encounters:  03/18/20 98.5 kg    Ideal Body Weight:  72.7 kg  BMI:  Body mass index is 32.07 kg/m.  Estimated Nutritional Needs:   Kcal:  2100-2300  Protein:  110-130 grams  Fluid:  >/= 2.0 L    13/05/21, MS, RD, LDN Inpatient Clinical Dietitian Please see AMiON for contact information.

## 2020-03-18 NOTE — Progress Notes (Signed)
Referring Physician(s): Dr. Dalene Seltzer  Supervising Physician: Ruel Favors  Patient Status:  [facility] - In-pt  Chief Complaint: Acute cholecystitis  Subjective: Patient significantly improved on assessment today.  He is alert, sitting up in bed.  Conversant.  Reports ongoing RUQ tenderness, but states it is overall better.  Insertion site mildly tender.   Allergies: Patient has no known allergies.  Medications: Prior to Admission medications   Medication Sig Start Date End Date Taking? Authorizing Provider  acetaminophen (TYLENOL) 500 MG tablet Take 500-1,000 mg by mouth every 6 (six) hours as needed for mild pain or headache.   Yes [provider]  allopurinol (ZYLOPRIM) 100 MG tablet TAKE 2 TABLETS BY MOUTH DAILY. Patient taking differently: Take 100 mg by mouth 2 (two) times daily.  05/03/14  Yes Runell Gess, MD  colchicine 0.6 MG tablet Take 0.6 mg by mouth daily as needed (gout attacks).   Yes [provider]  ferrous sulfate 325 (65 FE) MG tablet Take 1 tablet (325 mg total) by mouth 2 (two) times daily with a meal. Patient taking differently: Take 325 mg by mouth daily with breakfast.  08/09/16  Yes Ghimire, Werner Lean, MD  furosemide (LASIX) 40 MG tablet Take 1 tablet (40 mg total) by mouth 2 (two) times daily. Patient taking differently: Take 40 mg by mouth in the morning.  08/24/16  Yes Osvaldo Shipper, MD  gabapentin (NEURONTIN) 300 MG capsule Take 300-600 mg by mouth See admin instructions. Take 300 mg by mouth three times a day and 600 mg at bedtime   Yes [provider]  glipiZIDE (GLUCOTROL) 5 MG tablet Take 5 mg by mouth 2 (two) times daily before a meal.   Yes [provider]  hydrALAZINE (APRESOLINE) 100 MG tablet Take 100 mg by mouth 2 (two) times daily.   Yes [provider]  insulin aspart protamine- aspart (NOVOLOG MIX 70/30) (70-30) 100 UNIT/ML injection Inject 50 Units into the skin 2 (two) times daily before a  meal.    Yes [provider]  isosorbide mononitrate (IMDUR) 60 MG 24 hr tablet Take 1 tablet (60 mg total) by mouth daily. Appointment needed for future refills Patient taking differently: Take 60 mg by mouth daily.  05/03/15  Yes Runell Gess, MD  metolazone (ZAROXOLYN) 2.5 MG tablet Take 2.5 mg by mouth daily as needed (AS DIRECTED FOR MARKED SWELLING).   Yes [provider]  Multiple Vitamin (MULTIVITAMIN WITH MINERALS) TABS tablet Take 1 tablet by mouth daily.   Yes [provider]  nitroGLYCERIN (NITROSTAT) 0.4 MG SL tablet Place 0.4 mg under the tongue every 5 (five) minutes as needed for chest pain.   Yes [provider]  pantoprazole (PROTONIX) 40 MG tablet Take 40 mg by mouth 2 (two) times daily before a meal.    Yes [provider]  potassium chloride (K-DUR) 10 MEQ tablet Take 1 tablet (10 mEq total) by mouth daily. 08/31/16  Yes Barrett, Joline Salt, PA-C  pravastatin (PRAVACHOL) 40 MG tablet Take 40 mg by mouth at bedtime.    Yes [provider]  PRESCRIPTION MEDICATION Inhale into the lungs See admin instructions. CPAP- At bedtime and during any time of rest   Yes [provider]  Psyllium (METAMUCIL PO) Take 2 capsules by mouth at bedtime.   Yes [provider]  QUEtiapine (SEROQUEL) 50 MG tablet Take 50 mg by mouth at bedtime.    Yes [provider]  warfarin (COUMADIN) 1  MG tablet Take 1 mg by mouth daily as needed (in conjunction with the 5 mg strength, AS DIRECTED- depending on INR).  12/18/19  Yes [provider]  warfarin (COUMADIN) 5 MG tablet Take 5 mg by mouth daily with supper. 01/28/20  Yes [provider]  docusate sodium (COLACE) 100 MG capsule Take 1 capsule (100 mg total) by mouth 2 (two) times daily. Patient not taking: Reported on 03/14/2020 12/16/18   Jadene Pierini, MD  oxyCODONE (OXY IR/ROXICODONE) 5 MG immediate release tablet Take 1 tablet (5 mg total) by mouth  every 4 (four) hours as needed (pain). Patient not taking: Reported on 03/14/2020 12/16/18   Jadene Pierini, MD     Vital Signs: BP (!) 154/70   Pulse 70   Temp 98.1 F (36.7 C) (Oral)   Resp (!) 23   Ht 5\' 9"  (1.753 m)   Wt 217 lb 2.5 oz (98.5 kg)   SpO2 98%   BMI 32.07 kg/m   Physical Exam  NAD, alert Abdomen:  Soft, RUQ tenderness.  Perc cholecystostomy tube in place. Bloody output. Flushes easily.   Imaging: IR Perc Cholecystostomy  Result Date: 03/16/2020 INDICATION: 79 year old male with acute calculus cholecystitis and florid sepsis. He presents for second attempt at percutaneous cholecystostomy tube placement after being stabilized from acute cardiac arrest. EXAM: CHOLECYSTOSTOMY MEDICATIONS: In patient currently receiving intravenous Zosyn. No additional antibiotic prophylaxis administered. ANESTHESIA/SEDATION: Moderate (conscious) sedation was employed during this procedure. A total of Versed 1 mg and Fentanyl 25 mcg was administered intravenously. Moderate Sedation Time: 13 minutes minutes. The patient's level of consciousness and vital signs were monitored continuously by radiology nursing throughout the procedure under my direct supervision. FLUOROSCOPY TIME:  Fluoroscopy Time: 0 minutes 36 seconds (5 mGy). COMPLICATIONS: None immediate. PROCEDURE: Informed written consent was obtained from the patient after a thorough discussion of the procedural risks, benefits and alternatives. All questions were addressed. Maximal Sterile Barrier Technique was utilized including caps, mask, sterile gowns, sterile gloves, sterile drape, hand hygiene and skin antiseptic. A timeout was performed prior to the initiation of the procedure. The right upper quadrant was interrogated with ultrasound. Loops of colon can be seen interposed between the abdominal wall and liver. A suitable skin entry site that would facilitate a transhepatic access without transgressing the colon was identified. Local  anesthesia was attained by infiltration with 1% lidocaine. A small dermatotomy was made. Under real-time ultrasound guidance, a 21 gauge Accustick needle was advanced through the liver and into the gallbladder lumen. The stylet was removed and there was return of dark brown bile. A 0.018 wire was advanced in the gallbladder lumen. The Accustick needle was then exchanged for the transitional Accustick sheath which was advanced into the gallbladder lumen. Contrast injection confirms opacification of the gallbladder lumen. A 0.035 J wire was then advanced through the Accustick sheath and into the gallbladder. The Accustick sheath was removed. The transhepatic tract was percutaneously dilated to 10 76. A Cook 10.2 [place] all-purpose drainage catheter was advanced over the wire and formed in the gallbladder. Aspiration yields dark brown viscous bile. A sample was obtained and sent for culture. The catheter was gently flushed and connected to gravity bag drainage. The catheter was secured to the skin with 0 Prolene suture. IMPRESSION: Successful placement of percutaneous transhepatic cholecystostomy tube for acute calculus cholecystitis in a non operative candidate. Electronically Signed   By: [place] M.D.   On: 03/16/2020 16:19   DG CHEST PORT 1 VIEW  Result Date: 03/15/2020 CLINICAL DATA:  Endotracheal tube placement EXAM: PORTABLE CHEST 1 VIEW COMPARISON:  03/14/2020 FINDINGS: Cardiac shadow is enlarged but stable. Postsurgical changes are again seen. Endotracheal tube is noted in satisfactory position 3.5 cm above the carina. Gastric catheter extends into the stomach. Lungs are hypoinflated with minimal left basilar atelectasis. Mild vascular congestion is noted. No bony abnormality is seen. IMPRESSION: Endotracheal tube and gastric catheter in satisfactory position. Mild vascular congestion. Mild left basilar atelectasis new from the prior exam. Electronically Signed   By: Alcide Clever M.D.   On:  03/15/2020 18:39   ECHOCARDIOGRAM COMPLETE  Result Date: 03/16/2020    ECHOCARDIOGRAM REPORT   Patient Name:   Isaiah [name] [facility] Date of Exam: 03/16/2020 Medical Rec #:  [id number]      Height:       69.0 in Accession #:    [id number]     Weight:       207.7 lb Date of Birth:  Nov 19, 1940      BSA:          2.099 m Patient Age:    79 years       BP:           114/64 mmHg Patient Gender: M              HR:           62 bpm. Exam Location:  Inpatient Procedure: 2D Echo, Cardiac Doppler, Color Doppler and Intracardiac            Opacification Agent Indications:    Cardiac arrest  History:        Patient has prior history of Echocardiogram examinations, most                 recent 08/09/2016. CHF, CAD, Arrythmias:Atrial Fibrillation; Risk                 Factors:Hypertension and Dyslipidemia. H/O DVT and CVA.  Sonographer:    Ross Ludwig RDCS (AE) Referring Phys: [id number] Lorin Glass  Sonographer Comments: Technically difficult study due to poor echo windows. IMPRESSIONS  1. Severe biventricular dysfunction with at least mild-moderate secondary MR and TR.  2. Left ventricular ejection fraction, by estimation, is 20%. The left ventricle has severely decreased function. The left ventricle demonstrates global hypokinesis. There is mild left ventricular hypertrophy. Left ventricular diastolic parameters are indeterminate. There is the interventricular septum is flattened in systole and diastole, consistent with right ventricular pressure and volume overload.  3. Right ventricular systolic function is severely reduced. The right ventricular size is severely enlarged.  4. Left atrial size was mild to moderately dilated.  5. Right atrial size was severely dilated.  6. The mitral valve is normal in structure, and demonstrates tenting due to LV dysfunction. Mild to moderate mitral valve regurgitation. No evidence of mitral stenosis.  7. Tricuspid valve regurgitation is mild to moderate.  8. The aortic valve is grossly normal.  There is mild calcification of the aortic valve. Aortic valve regurgitation is trivial. No aortic stenosis is present. Conclusion(s)/Recommendation(s): No definite left ventricular mural or apical thrombus/thrombi. Swirling apical contrast. FINDINGS  Left Ventricle: Left ventricular ejection fraction, by estimation, is 20%. The left ventricle has severely decreased function. The left ventricle demonstrates global hypokinesis. Definity contrast agent was given IV to delineate the left ventricular endocardial borders. The left ventricular internal cavity size was normal in size. There is mild left ventricular hypertrophy. The interventricular septum is flattened in  systole and diastole, consistent with right ventricular pressure and volume overload. Left ventricular diastolic parameters are indeterminate. Right Ventricle: The right ventricular size is severely enlarged. Right vetricular wall thickness was not well visualized. Right ventricular systolic function is severely reduced. Left Atrium: Left atrial size was mild to moderately dilated. Right Atrium: Right atrial size was severely dilated. Pericardium: There is no evidence of pericardial effusion. Mitral Valve: The mitral valve is normal in structure. Mild to moderate mitral valve regurgitation. No evidence of mitral valve stenosis. MV peak gradient, 3.6 mmHg. The mean mitral valve gradient is 1.0 mmHg. Tricuspid Valve: The tricuspid valve is normal in structure. Tricuspid valve regurgitation is mild to moderate. No evidence of tricuspid stenosis. Aortic Valve: The aortic valve is grossly normal. There is mild calcification of the aortic valve. Aortic valve regurgitation is trivial. No aortic stenosis is present. Aortic valve mean gradient measures 2.0 mmHg. Aortic valve peak gradient measures 3.5  mmHg. Aortic valve area, by VTI measures 1.61 cm. Pulmonic Valve: The pulmonic valve was not well visualized. Pulmonic valve regurgitation is trivial. No evidence of  pulmonic stenosis. Aorta: The aortic root is normal in size and structure. Venous: The inferior vena cava was not well visualized. IVC assessment for right atrial pressure unable to be performed due to mechanical ventilation. IAS/Shunts: No atrial level shunt detected by color flow Doppler.  LEFT VENTRICLE PLAX 2D LVIDd:         4.03 cm  Diastology LVIDs:         3.03 cm  LV e' medial:    4.73 cm/s LV PW:         1.30 cm  LV E/e' medial:  15.4 LV IVS:        1.30 cm  LV e' lateral:   6.25 cm/s LVOT diam:     1.80 cm  LV E/e' lateral: 11.7 LV SV:         24 LV SV Index:   11 LVOT Area:     2.54 cm  RIGHT VENTRICLE RV Basal diam:  4.74 cm RV Mid diam:    4.95 cm RV S prime:     4.00 cm/s TAPSE (M-mode): 0.9 cm LEFT ATRIUM             Index       RIGHT ATRIUM           Index LA diam:        4.40 cm 2.10 cm/m  RA Area:     32.10 cm LA Vol (A2C):   80.4 ml 38.30 ml/m RA Volume:   121.00 ml 57.64 ml/m LA Vol (A4C):   74.2 ml 35.34 ml/m LA Biplane Vol: 78.4 ml 37.35 ml/m  AORTIC VALVE AV Area (Vmax):    1.64 cm AV Area (Vmean):   1.59 cm AV Area (VTI):     1.61 cm AV Vmax:           93.50 cm/s AV Vmean:          60.900 cm/s AV VTI:            0.147 m AV Peak Grad:      3.5 mmHg AV Mean Grad:      2.0 mmHg LVOT Vmax:         60.40 cm/s LVOT Vmean:        38.000 cm/s LVOT VTI:          0.093 m LVOT/AV VTI ratio: 0.63  AORTA Ao Root diam:  3.10 cm Ao Asc diam:  3.50 cm MITRAL VALVE               TRICUSPID VALVE MV Area (PHT): 3.53 cm    TR Peak grad:   23.6 mmHg MV Peak grad:  3.6 mmHg    TR Vmax:        243.00 cm/s MV Mean grad:  1.0 mmHg MV Vmax:       0.95 m/s    SHUNTS MV Vmean:      44.3 cm/s   Systemic VTI:  0.09 m MV Decel Time: 215 msec    Systemic Diam: 1.80 cm MV E velocity: 72.90 cm/s MV A velocity: 48.00 cm/s MV E/A ratio:  1.52 Weston Brass MD Electronically signed by Weston Brass MD Signature Date/Time: 03/16/2020/10:35:55 AM    Final     Labs:  CBC: Recent Labs    03/15/20 1650  03/15/20 1802 03/16/20 0309 03/16/20 0347 03/17/20 0357 03/18/20 0229  WBC 21.3*  --  14.6*  --  9.6 9.2  HGB 11.8*   < > 11.8* 12.6* 11.1* 10.5*  HCT 36.8*   < > 35.8* 37.0* 33.2* 32.8*  PLT 142*  --  179  --  145* 143*   < > = values in this interval not displayed.    COAGS: Recent Labs    03/14/20 0911 03/15/20 0643 03/15/20 1757 03/18/20 0229  INR 2.7* 1.5* 1.5* 1.5*  APTT 44*  --  35  --     BMP: Recent Labs    03/15/20 1650 03/15/20 1802 03/16/20 0309 03/16/20 0347 03/17/20 0357 03/18/20 0229  NA 135   < > 131* 132* 136 138  K 3.3*   < > 4.1 4.2 3.0* 4.0  CL 90*  --  90*  --  95* 98  CO2 34*  --  25  --  29 28  GLUCOSE 198*  --  309*  --  132* 111*  BUN 85*  --  92*  --  91* 78*  CALCIUM 8.4*  --  8.3*  --  8.2* 8.2*  CREATININE 2.53*  --  2.47*  --  2.51* 2.26*  GFRNONAA 25*  --  26*  --  25* 29*   < > = values in this interval not displayed.    LIVER FUNCTION TESTS: Recent Labs    03/14/20 0743  BILITOT 1.1  AST 24  ALT 13  ALKPHOS 82  PROT 7.8  ALBUMIN 2.8*    Assessment and Plan: Acute cholecystitis s/p percutaneous cholecystostomy tube placement 11/3 Patient improving s/p cholecystostomy tube placement.  Note bloody output today.  WBC improved to 9.2.  Afebrile.  Culture positive for enterococcus faecium.  Working on improving appetite.  Continue current drain management with daily flushes.   Electronically Signed: Hoyt Koch, PA 03/18/2020, 3:30 PM   I spent a total of 15 Minutes at the the patient's bedside AND on the patient's hospital floor or unit, greater than 50% of which was counseling/coordinating care for acute cholecystitis.

## 2020-03-19 DIAGNOSIS — A419 Sepsis, unspecified organism: Secondary | ICD-10-CM | POA: Diagnosis not present

## 2020-03-19 DIAGNOSIS — K810 Acute cholecystitis: Secondary | ICD-10-CM | POA: Diagnosis not present

## 2020-03-19 DIAGNOSIS — I5021 Acute systolic (congestive) heart failure: Secondary | ICD-10-CM | POA: Diagnosis not present

## 2020-03-19 DIAGNOSIS — N179 Acute kidney failure, unspecified: Secondary | ICD-10-CM | POA: Diagnosis not present

## 2020-03-19 LAB — CULTURE, BLOOD (ROUTINE X 2)
Culture: NO GROWTH
Culture: NO GROWTH
Special Requests: ADEQUATE

## 2020-03-19 LAB — BASIC METABOLIC PANEL
Anion gap: 12 (ref 5–15)
BUN: 66 mg/dL — ABNORMAL HIGH (ref 8–23)
CO2: 28 mmol/L (ref 22–32)
Calcium: 8.5 mg/dL — ABNORMAL LOW (ref 8.9–10.3)
Chloride: 100 mmol/L (ref 98–111)
Creatinine, Ser: 1.96 mg/dL — ABNORMAL HIGH (ref 0.61–1.24)
GFR, Estimated: 34 mL/min — ABNORMAL LOW (ref >60)
Glucose, Bld: 118 mg/dL — ABNORMAL HIGH (ref 70–99)
Potassium: 4.1 mmol/L (ref 3.5–5.1)
Sodium: 140 mmol/L (ref 135–145)

## 2020-03-19 LAB — CBC
HCT: 33.4 % — ABNORMAL LOW (ref 39.0–52.0)
Hemoglobin: 10.5 g/dL — ABNORMAL LOW (ref 13.0–17.0)
MCH: 33.3 pg (ref 26.0–34.0)
MCHC: 31.4 g/dL (ref 30.0–36.0)
MCV: 106 fL — ABNORMAL HIGH (ref 80.0–100.0)
Platelets: 143 10*3/uL — ABNORMAL LOW (ref 150–400)
RBC: 3.15 MIL/uL — ABNORMAL LOW (ref 4.22–5.81)
RDW: 14.2 % (ref 11.5–15.5)
WBC: 8.2 10*3/uL (ref 4.0–10.5)
nRBC: 0.4 % — ABNORMAL HIGH (ref 0.0–0.2)

## 2020-03-19 LAB — GLUCOSE, CAPILLARY
Glucose-Capillary: 122 mg/dL — ABNORMAL HIGH (ref 70–99)
Glucose-Capillary: 138 mg/dL — ABNORMAL HIGH (ref 70–99)
Glucose-Capillary: 140 mg/dL — ABNORMAL HIGH (ref 70–99)
Glucose-Capillary: 167 mg/dL — ABNORMAL HIGH (ref 70–99)
Glucose-Capillary: 173 mg/dL — ABNORMAL HIGH (ref 70–99)
Glucose-Capillary: 191 mg/dL — ABNORMAL HIGH (ref 70–99)

## 2020-03-19 LAB — HEPARIN LEVEL (UNFRACTIONATED): Heparin Unfractionated: 0.27 IU/mL — ABNORMAL LOW (ref 0.30–0.70)

## 2020-03-19 LAB — PROTIME-INR
INR: 1.6 — ABNORMAL HIGH (ref 0.8–1.2)
Prothrombin Time: 18.8 seconds — ABNORMAL HIGH (ref 11.4–15.2)

## 2020-03-19 MED ORDER — WHITE PETROLATUM EX OINT
TOPICAL_OINTMENT | CUTANEOUS | Status: AC
  Filled 2020-03-19: qty 28.35

## 2020-03-19 MED ORDER — SODIUM CHLORIDE 0.9% FLUSH
5.0000 mL | Freq: Three times a day (TID) | INTRAVENOUS | Status: DC
  Administered 2020-03-19 – 2020-04-05 (×50): 5 mL

## 2020-03-19 MED ORDER — WARFARIN SODIUM 7.5 MG PO TABS
7.5000 mg | ORAL_TABLET | Freq: Once | ORAL | Status: AC
  Administered 2020-03-19: 7.5 mg via ORAL
  Filled 2020-03-19: qty 1

## 2020-03-19 MED ORDER — LOPERAMIDE HCL 2 MG PO CAPS
2.0000 mg | ORAL_CAPSULE | ORAL | Status: DC | PRN
  Administered 2020-03-20 – 2020-03-25 (×2): 2 mg via ORAL
  Filled 2020-03-19 (×4): qty 1

## 2020-03-19 NOTE — Progress Notes (Signed)
Progress Note  Patient Name: Isaiah [name] Date of Encounter: 03/19/2020  [facility] Cardiologist: No primary care provider on file.   Subjective   Extubated, using incentive spirometry. Denies pain.   Inpatient Medications    Scheduled Meds: . acetaminophen (TYLENOL) oral liquid 160 mg/5 mL  650 mg Oral Q4H  . acetaminophen  650 mg Rectal Q4H  . allopurinol  100 mg Oral BID  . amoxicillin-clavulanate  500 mg Oral Q12H  . aspirin  81 mg Oral Daily  . chlorhexidine gluconate (MEDLINE KIT)  15 mL Mouth Rinse BID  . Chlorhexidine Gluconate Cloth  6 each Topical Daily  . docusate  100 mg Oral BID  . gabapentin  300 mg Oral Q12H  . insulin aspart  0-20 Units Subcutaneous Q4H  . mouth rinse  15 mL Mouth Rinse BID  . pantoprazole  40 mg Oral BID  . QUEtiapine  50 mg Oral Q supper  . rosuvastatin  10 mg Oral QHS  . sodium chloride flush  3 mL Intravenous Q12H  . sodium chloride flush  5 mL Intracatheter Q8H  . Warfarin - Pharmacist Dosing Inpatient   Does not apply q1600   Continuous Infusions:  . sodium chloride Stopped (03/17/20 2016)   PRN Meds: hydrALAZINE, ondansetron **OR** ondansetron (ZOFRAN) IV   Vital Signs    Vitals:   03/18/20 2258 03/19/20 0020 03/19/20 0300 03/19/20 0700  BP: 118/62  (!) 151/62 (!) 157/67  Pulse: 69 68 67 69  Resp: 20 (!) 23 20 (!) 21  Temp: 98.6 F (37 C)  98.5 F (36.9 C) 98.1 F (36.7 C)  TempSrc: Oral  Oral Oral  SpO2: 91% 93% 98% 100%  Weight:      Height:        Intake/Output Summary (Last 24 hours) at 03/19/2020 1041 Last data filed at 03/19/2020 1696 Gross per 24 hour  Intake 405.99 ml  Output 1775 ml  Net -1369.01 ml   Last 3 Weights 03/18/2020 03/14/2020 03/14/2020  Weight (lbs) 217 lb 2.5 oz 207 lb 10.8 oz 210 lb  Weight (kg) 98.5 kg 94.2 kg 95.255 kg     Telemetry    Atrial fibrillation, rate controlled HR 60-70' bpm, frequent PVCs - Personally Reviewed  Physical Exam   Intubated, obese male, awakens to  verbal stimuli and able to follow simple commands GEN: No acute distress.   Neck: No JVD Cardiac: irregular, no murmurs, rubs, or gallops.  Respiratory: Clear to auscultation bilaterally. GI: Soft, nontender, non-distended  MS: No edema; No deformity. Feet warmer today. Neuro:  Nonfocal  Psych: unable to assess  Labs    High Sensitivity Troponin:   Recent Labs  Lab 03/15/20 1705 03/15/20 1916 03/16/20 0008 03/16/20 0309  TROPONINIHS 61* 308* 982* 1,364*     Chemistry Recent Labs  Lab 03/14/20 0743 03/15/20 0027 03/17/20 0357 03/18/20 0229 03/19/20 0053  NA 130*   < > 136 138 140  K 3.5   < > 3.0* 4.0 4.1  CL 78*   < > 95* 98 100  CO2 35*   < > _0 GLUCOSE 306*   < > 132* 111* 118*  BUN 73*   < > 91* 78* 66*  CREATININE 3.45*   < > 2.51* 2.26* 1.96*  CALCIUM 10.0   < > 8.2* 8.2* 8.5*  PROT 7.8  --   --   --   --   ALBUMIN 2.8*  --   --   --   --  AST 24  --   --   --   --   ALT 13  --   --   --   --   ALKPHOS 82  --   --   --   --   BILITOT 1.1  --   --   --   --   GFRNONAA 17*   < > 25* 29* 34*  ANIONGAP 17*   < > _0 < > = values in this interval not displayed.    Hematology Recent Labs  Lab 03/17/20 0357 03/18/20 0229 03/19/20 0053  WBC 9.6 9.2 8.2  RBC 3.29* 3.12* 3.15*  HGB 11.1* 10.5* 10.5*  HCT 33.2* 32.8* 33.4*  MCV 100.9* 105.1* 106.0*  MCH 33.7 33.7 33.3  MCHC 33.4 32.0 31.4  RDW 13.9 14.3 14.2  PLT 145* 143* 143*   BNP Recent Labs  Lab 03/14/20 0752  BNP 532.7*    DDimer No results for input(s): DDIMER in the last 168 hours.   Radiology    No results found.  Cardiac Studies   2D Echo as above  Patient Profile     79 y.o. male with acute cholecystitis and PEA arrest, noted to have severe LV dysfunction post-arrest and also elevated troponin consistent with demand ischemia of the myocardium  Assessment & Plan    1. Acute on chronic systolic heart failure with cardiogenic shock: off pressors, extubated. Multiple  issues with AKI, afib, severe LV dysfunction. Not a candidate for ACE/ARB/aldo antagonist/beta blocker because of bradycardia, AKI. Add low dose lasix 40 mg iv BID x 2 doses  2. Demand ischemia of the myocardium: known underlying CAD s/p CABG and PCI, with PEA arrest. Troponin peak at 1364 (in setting AKI) does not suggest large infarct. Medical therapy appropriate, not a candidate for invasive evaluation with cath.   3. Atrial fibrillation: slow ventricular rate this am. Multiple episodes of bradycardia down to 30' but asymptomatic. Suspect longstanding AF, but history limited. Pt has been on chronic warfarin. IV amiodarone was discontinued with slow ventricular rate. Add beta-blocker when HR/BP will allow.   4. Ac on chr kidney failure - Crea improving 2.51 --> 2.26 --> 1.96.  Will follow with you.  For questions or updates, please contact [place] Please consult [email or web address] for contact info under     Signed, Ena Dawley, MD  03/19/2020, 10:41 AM

## 2020-03-19 NOTE — Progress Notes (Signed)
ANTICOAGULATION CONSULT NOTE - Follow-Up  Pharmacy Consult for heparin >warfarin Indication: chest pain/ACS and atrial fibrillation  No Known Allergies  Patient Measurements: Height: 5\' 9"  (175.3 cm) Weight: 98.5 kg (217 lb 2.5 oz) IBW/kg (Calculated) : 70.7 Heparin Dosing Weight: 90.1 kg  Vital Signs: Temp: 98.1 F (36.7 C) (11/06 0700) Temp Source: Oral (11/06 0700) BP: 157/67 (11/06 0700) Pulse Rate: 69 (11/06 0700)  Labs: Recent Labs    03/17/20 0357 03/17/20 0357 03/17/20 0805 03/17/20 1638 03/18/20 0229 03/19/20 0053  HGB 11.1*   < >  --   --  10.5* 10.5*  HCT 33.2*  --   --   --  32.8* 33.4*  PLT 145*  --   --   --  143* 143*  LABPROT  --   --   --   --  17.2* 18.8*  INR  --   --   --   --  1.5* 1.6*  HEPARINUNFRC  --   --    < > 0.52 0.34 0.27*  CREATININE 2.51*  --   --   --  2.26* 1.96*   < > = values in this interval not displayed.    Estimated Creatinine Clearance: 35.4 mL/min (A) (by C-G formula based on SCr of 1.96 mg/dL (H)).   Medical History: Past Medical History:  Diagnosis Date  . Atrial fibrillation (HCC)    on Coumadin  . CAD (coronary artery disease)    s/p remote CABG, stent in 2019  . CHF (congestive heart failure) (HCC)   . Dementia (HCC)   . Diabetes mellitus   . DVT (deep venous thrombosis) (HCC)   . Fall at home 10/2015  . Hyperlipidemia   . Hypertension   . Left-sided carotid artery disease (HCC)   . MRSA infection   . Myocardial infarction (HCC)   . Obesity   . Peripheral vascular disease (HCC)   . Sleep apnea   . Stroke (HCC)   . Stroke (HCC)   . Venous insufficiency      Assessment: 11 you male with a history of PVD, CAD, HTN, HLD, DM and afib on warfarin presents with an NSTEMI. Pharmacy was consulted to dose heparin. PTA the patient was on warfarin at 5 mg PO daily with an INR goal of 2-3. It is unknown whether or not the patient was therapeutic while on this dose.  Upon presentation on 11/1, INR was 2.7 and the  patient received 10 mg of vitamin K in preparation for a perc chole tube. The perc chole tube was placed 11/3. Pharmacy is consulted now consulted to dose warfarin.   Heparin stopped by MD this morning. INR subtherapeutic but trending up to 1.6 today after 2 doses of warfarin. CBC stable. No overt bleeding or infusion issues noted.  Goal of Therapy:  INR 2-3 Monitor platelets by anticoagulation protocol: Yes   Plan:  Give warfarin 7.5 mg PO x1 dose tonight Monitor for signs and symptoms of bleeding Monitor daily PT/INR, heparin level and CBC  13/3, PharmD PGY2 Cardiology Pharmacy Resident Phone: [phone] 03/19/2020  3:01 PM  Please check [email or web address] for unit-specific pharmacy phone numbers.

## 2020-03-19 NOTE — Progress Notes (Signed)
PROGRESS NOTE    Isaiah [name]  ZDG:[id number] DOB: May 07, 1941 DOA: 03/14/2020 PCP: [name], [name], MD   Chief Complain: Weakness, pressure ulcer  Brief Narrative:  Patient is a 79 year old male with history of peripheral vascular disease, coronary artery disease, hypertension, hyperlipidemia, diabetes type 2, A. fib on Coumadin, systolic congestive heart failure who presented with weakness and pressure ulcer on the buttock from home.  He lives with his daughter.  He was also complaining of right upper quadrant pain.  On presentation, severe sepsis was suspected.  He had AKI, fever, leukocytosis.  CT was concerning for gallbladder pathology.  Patient was started on broad start antibiotics and IV fluids.  Patient hospital course remarkable for septic shock needing transfer to ICU for pressor support.  He underwent drain placement in the right upper quadrant by IR for calculus cholecystitis.  Assessment & Plan:   Principal Problem:   Sepsis due to undetermined organism [facility]) Active Problems:   Coronary artery disease   Chronic atrial fibrillation (HCC)   Essential hypertension   Hyperlipidemia   Type 2 diabetes mellitus with vascular disease (HCC)   Obstructive sleep apnea   CKD (chronic kidney disease), stage III ([place])   ARF (acute renal failure) (HCC)   Pressure injury of skin   Acute cholecystitis   Class 1 obesity due to excess calories with body mass index (BMI) of 33.0 to 33.9 in adult   Shock [facility])   Cardiac arrest ([place])   Acute systolic heart failure (HCC)   Severe sepsis with septic shock: Presented with fever, leukocytosis, AKI, encephalopathy.  Hospital course remarkable for septic shock needing pressor support and transferred to ICU service.  Continue current antibiotics.  Blood Cultures so far negative.  Currently hemodynamically stable.  Acute cholecystitis: Presented with right upper quadrant pain.  Imaging showed distended gallbladder with layering gallstone and  mild pericholestatic stranding.  Due to severity of illness, he underwent percutaneous cholecystostomy by IR with drain placement.  Continue current antibiotics.  Currently on Augmentin.  He was earlier treated with Zosyn.  Continue pain management and supportive care. Aerobic/aerobic culture showed Enterococcus faecium. On discharge to home ,he will go with the drain.  Acute respiratory failure with hypoxia: Secondary to sepsis, underlying OSA.  Had to be intubated and was under as PCCM service.  Currently extubated.  He is on oxygen at home.  And he is at baseline oxygen requirement.  AKI on CKD stage IIIb: Baseline creatinine around 1.6.  Presented with severe AKI which is improving.  AKI is secondary to prerenal failure secondary to dehydration, sepsis, septic shock.  IV fluids have been stopped due to presence of severe congestive heart failure.  He is on diuretics at home which are on hold.  Avoid nephrotoxins.  Acute encephalopathy: Secondary to sepsis.  Currently alert and oriented.He is HOH  Type II NSTEMI/elevated troponin: Supply demand ischemia from sepsis/septic shock.  Cardiology were consulted with no plan for any intervention.  Severe acute on chronic systolic congestive heart failure: TTE showed reduced ejection fraction of 20% with RV dilation.  Cardiology following.  ACE/ARB on hold due to AKI.  He is also on Lasix and metolazone at home which is on hold due to septic shock.  We might resume on discharge.  Pressure injury on the buttocks: Present on admission.  Wound care consulted.  Has a grade 2 pressure ulcers on the bilateral buttocks.  Urinary retention: Continue intermittent straight cath.  May need Foley if continues to retain.  Coronary artery disease: No complaint of chest pain.  Continue Imdur.  Follow-up with cardiology as an outpatient.  Status post CABG and PCI with stents.  Permanent A. fib: Currently rate is controlled.  On Coumadin for anticoagulation.  He was  also on IV heparin for NSTEMI.  Continue to monitor INR.  Hospital course remarkable for bradycardia episodes.  Amiodarone discontinued in the setting of bradycardia.  Not on beta-blockers.  We discontinued  heparin drip  and will continue on warfarin.  Hypertension: Currently blood pressure stable.  Continue current medications  Hyperlipidemia: On Crestor  Diabetes type 2: Poorly controlled.  Continue current insulin regimen.  Resume home regimen on discharge.  Diabetic coordinator was consulted.  Hemoglobin A1c of 12.1  OSA: On CPAP  Obesity: BMI of 32.  Debility/deconditioning: PT/OT consulted skilled nursing facility on discharge.  Patient lives with his daughter.  Daughter Butch Penny wants to take him home with home health  Pressure Injury 03/14/20 Buttocks Right Stage 2 -  Partial thickness loss of dermis presenting as a shallow open injury with a red, pink wound bed without slough. (Active)  03/14/20 2302  Location: Buttocks  Location Orientation: Right  Staging: Stage 2 -  Partial thickness loss of dermis presenting as a shallow open injury with a red, pink wound bed without slough.  Wound Description (Comments):   Present on Admission: Yes     Pressure Injury 03/14/20 Buttocks Left Stage 2 -  Partial thickness loss of dermis presenting as a shallow open injury with a red, pink wound bed without slough. (Active)  03/14/20 2300  Location: Buttocks  Location Orientation: Left  Staging: Stage 2 -  Partial thickness loss of dermis presenting as a shallow open injury with a red, pink wound bed without slough.  Wound Description (Comments):   Present on Admission: Yes            Nutrition Problem: Increased nutrient needs Etiology: wound healing      DVT prophylaxis: Warfarin Code Status: Full code Family Communication: Son at bedside, Daughter on phone on 03/19/20 Status is: Inpatient  Remains inpatient appropriate because:Inpatient level of care appropriate due to  severity of illness   Dispo: The patient is from: Home              Anticipated d/c is OI:NOMV with home health              Anticipated d/c date is: 2 days              Patient currently is not medically stable to d/c.      Consultants: PCCM, IR, surgery, cardiology  Procedures: Cholecystostomy drain placement  Antimicrobials:  Anti-infectives (From admission, onward)   Start     Dose/Rate Route Frequency Ordered Stop   03/18/20 2200  amoxicillin-clavulanate (AUGMENTIN) 500-125 MG per tablet 500 mg  Status:  Discontinued        1 tablet Oral Every 12 hours 03/18/20 1459 03/18/20 1502   03/18/20 2200  amoxicillin-clavulanate (AUGMENTIN) 250-62.5 MG/5ML suspension 500 mg        500 mg Oral Every 12 hours 03/18/20 1502 03/21/20 0959   03/15/20 2200  piperacillin-tazobactam (ZOSYN) IVPB 3.375 g        3.375 g 12.5 mL/hr over 240 Minutes Intravenous Every 8 hours 03/15/20 1755 03/18/20 1852   03/15/20 1000  ceFEPIme (MAXIPIME) 2 g in sodium chloride 0.9 % 100 mL IVPB  Status:  Discontinued        2 g  200 mL/hr over 30 Minutes Intravenous Every 24 hours 03/14/20 1020 03/15/20 0843   03/15/20 0930  cefTRIAXone (ROCEPHIN) 2 g in sodium chloride 0.9 % 100 mL IVPB  Status:  Discontinued        2 g 200 mL/hr over 30 Minutes Intravenous Every 24 hours 03/15/20 0844 03/15/20 1755   03/14/20 1600  metroNIDAZOLE (FLAGYL) IVPB 500 mg  Status:  Discontinued        500 mg 100 mL/hr over 60 Minutes Intravenous Every 8 hours 03/14/20 1427 03/15/20 1755   03/14/20 0800  ceFEPIme (MAXIPIME) 2 g in sodium chloride 0.9 % 100 mL IVPB        2 g 200 mL/hr over 30 Minutes Intravenous  Once 03/14/20 0752 03/14/20 0845   03/14/20 0800  metroNIDAZOLE (FLAGYL) IVPB 500 mg        500 mg 100 mL/hr over 60 Minutes Intravenous  Once 03/14/20 0752 03/14/20 0935      Subjective:  Patient seen and examined at the bedside this morning.  Very comfortable during my evaluation.  Son was at the bedside.  He had  intermittent episodes of bradycardia but asymptomatic.  Does not complain of any abdominal pain, no nausea or vomiting.  Also complained of 3-4 episodes of loose bowel movement. Objective: Vitals:   03/18/20 2258 03/19/20 0020 03/19/20 0300 03/19/20 0700  BP: 118/62  (!) 151/62 (!) 157/67  Pulse: 69 68 67 69  Resp: 20 (!) 23 20 (!) 21  Temp: 98.6 F (37 C)  98.5 F (36.9 C)   TempSrc: Oral  Oral   SpO2: 91% 93% 98% 100%  Weight:      Height:        Intake/Output Summary (Last 24 hours) at 03/19/2020 0738 Last data filed at 03/19/2020 4166 Gross per 24 hour  Intake 496.68 ml  Output 1775 ml  Net -1278.32 ml   Filed Weights   03/14/20 0810 03/14/20 2205 03/18/20 0400  Weight: 95.3 kg 94.2 kg 98.5 kg    Examination:  General exam: Appears calm and comfortable ,Not in distress, pleasant elderly male, obese HEENT:PERRL,Oral mucosa moist, Ear/Nose normal on gross exam, hard on hearing Respiratory system: Bilateral equal air entry, normal vesicular breath sounds, no wheezes or crackles  Cardiovascular system: S1 & S2 heard, RRR. No JVD, murmurs, rubs, gallops or clicks.  Trace pedal edema. Gastrointestinal system: Abdomen is distended, soft and nontender. No organomegaly or masses felt. Normal bowel sounds heard.  Right upper quadrant drain. Central nervous system: Alert and oriented. No focal neurological deficits. Extremities: Mild bilateral lower extremity edema, no clubbing ,no cyanosis Skin: Pressure injury in the buttocks as described above   Data Reviewed: I have personally reviewed following labs and imaging studies  CBC: Recent Labs  Lab 03/14/20 0743 03/15/20 0027 03/15/20 1650 03/15/20 1802 03/16/20 0309 03/16/20 0347 03/17/20 0357 03/18/20 0229 03/19/20 0053  WBC 21.2*   < > 21.3*  --  14.6*  --  9.6 9.2 8.2  NEUTROABS 18.6*  --   --   --   --   --   --   --   --   HGB 13.9   < > 11.8*   < > 11.8* 12.6* 11.1* 10.5* 10.5*  HCT 43.4   < > 36.8*   < > 35.8*  37.0* 33.2* 32.8* 33.4*  MCV 105.3*   < > 105.1*  --  100.8*  --  100.9* 105.1* 106.0*  PLT 141*   < > 142*  --  179  --  145* 143* 143*   < > = values in this interval not displayed.   Basic Metabolic Panel: Recent Labs  Lab 03/15/20 0027 03/15/20 0027 03/15/20 1650 03/15/20 1650 03/15/20 1757 03/15/20 1802 03/16/20 0309 03/16/20 0347 03/17/20 0357 03/18/20 0229  NA 131*   < > 135   < >  --  140 131* 132* 136 138  K 3.0*   < > 3.3*   < >  --  2.6* 4.1 4.2 3.0* 4.0  CL 86*  --  90*  --   --   --  90*  --  95* 98  CO2 33*  --  34*  --   --   --  25  --  29 28  GLUCOSE 207*  --  198*  --   --   --  309*  --  132* 111*  BUN 83*  --  85*  --   --   --  92*  --  91* 78*  CREATININE 2.75*  --  2.53*  --   --   --  2.47*  --  2.51* 2.26*  CALCIUM 9.2  --  8.4*  --   --   --  8.3*  --  8.2* 8.2*  MG 2.6*  --   --   --  2.5*  --  2.3  --  2.5*  --   PHOS  --   --   --   --   --   --  3.7  --   --   --    < > = values in this interval not displayed.   GFR: Estimated Creatinine Clearance: 30.7 mL/min (A) (by C-G formula based on SCr of 2.26 mg/dL (H)). Liver Function Tests: Recent Labs  Lab 03/14/20 0743  AST 24  ALT 13  ALKPHOS 82  BILITOT 1.1  PROT 7.8  ALBUMIN 2.8*   Recent Labs  Lab 03/14/20 0818  LIPASE 35   Recent Labs  Lab 03/14/20 0744  AMMONIA 17   Coagulation Profile: Recent Labs  Lab 03/14/20 0911 03/15/20 0643 03/15/20 1757 03/18/20 0229 03/19/20 0053  INR 2.7* 1.5* 1.5* 1.5* 1.6*   Cardiac Enzymes: No results for input(s): CKTOTAL, CKMB, CKMBINDEX, TROPONINI in the last 168 hours. BNP (last 3 results) No results for input(s): PROBNP in the last 8760 hours. HbA1C: No results for input(s): HGBA1C in the last 72 hours. CBG: Recent Labs  Lab 03/18/20 1147 03/18/20 1605 03/18/20 2002 03/18/20 2257 03/19/20 0304  GLUCAP 88 116* 168* 119* 122*   Lipid Profile: No results for input(s): CHOL, HDL, LDLCALC, TRIG, CHOLHDL, LDLDIRECT in the last  72 hours. Thyroid Function Tests: No results for input(s): TSH, T4TOTAL, FREET4, T3FREE, THYROIDAB in the last 72 hours. Anemia Panel: No results for input(s): VITAMINB12, FOLATE, FERRITIN, TIBC, IRON, RETICCTPCT in the last 72 hours. Sepsis Labs: Recent Labs  Lab 03/14/20 0743 03/14/20 0818 03/14/20 2125 03/15/20 0027 03/15/20 1757 03/15/20 2231  PROCALCITON  --  7.85  --   --   --   --   LATICACIDVEN   < >  --  1.2 0.9 2.6* 2.8*   < > = values in this interval not displayed.    Recent Results (from the past 240 hour(s))  Blood culture (routine x 2)     Status: None (Preliminary result)   Collection Time: 03/14/20  7:44 AM   Specimen: BLOOD LEFT ARM  Result Value Ref Range Status  Specimen Description BLOOD LEFT ARM  Final   Special Requests   Final    BOTTLES DRAWN AEROBIC AND ANAEROBIC Blood Culture adequate volume   Culture   Final    NO GROWTH 4 DAYS Performed at [facility], [address], [place], [place] [address]    Report Status PENDING  Incomplete  Respiratory Panel by RT PCR (Flu A&B, Covid) - Nasopharyngeal Swab     Status: None   Collection Time: 03/14/20  8:03 AM   Specimen: Nasopharyngeal Swab  Result Value Ref Range Status   SARS Coronavirus 2 by RT PCR NEGATIVE NEGATIVE Final    Comment: (NOTE) SARS-CoV-2 target nucleic acids are NOT DETECTED.  The SARS-CoV-2 RNA is generally detectable in upper respiratoy specimens during the acute phase of infection. The lowest concentration of SARS-CoV-2 viral copies this assay can detect is 131 copies/mL. A negative result does not preclude SARS-Cov-2 infection and should not be used as the sole basis for treatment or other patient management decisions. A negative result may occur with  improper specimen collection/handling, submission of specimen other than nasopharyngeal swab, presence of viral mutation(s) within the areas targeted by this assay, and inadequate number of viral copies (<131 copies/mL).  A negative result must be combined with clinical observations, patient history, and epidemiological information. The expected result is Negative.  Fact Sheet for Patients:  PinkCheek.be  Fact Sheet for Healthcare Providers:  GravelBags.it  This test is no t yet approved or cleared by the [place] FDA and  has been authorized for detection and/or diagnosis of SARS-CoV-2 by FDA under an Emergency Use Authorization (EUA). This EUA will remain  in effect (meaning this test can be used) for the duration of the COVID-19 declaration under Section 564(b)(1) of the Act, 21 U.S.C. section 360bbb-3(b)(1), unless the authorization is terminated or revoked sooner.     Influenza A by PCR NEGATIVE NEGATIVE Final   Influenza B by PCR NEGATIVE NEGATIVE Final    Comment: (NOTE) The Xpert Xpress SARS-CoV-2/FLU/RSV assay is intended as an aid in  the diagnosis of influenza from Nasopharyngeal swab specimens and  should not be used as a sole basis for treatment. Nasal washings and  aspirates are unacceptable for Xpert Xpress SARS-CoV-2/FLU/RSV  testing.  Fact Sheet for Patients: PinkCheek.be  Fact Sheet for Healthcare Providers: GravelBags.it  This test is not yet approved or cleared by the [place] FDA and  has been authorized for detection and/or diagnosis of SARS-CoV-2 by  FDA under an Emergency Use Authorization (EUA). This EUA will remain  in effect (meaning this test can be used) for the duration of the  Covid-19 declaration under Section 564(b)(1) of the Act, 21  U.S.C. section 360bbb-3(b)(1), unless the authorization is  terminated or revoked. Performed at [facility], [employer] [address], [place], [place] [address]   Blood culture (routine x 2)     Status: None (Preliminary result)   Collection Time: 03/14/20  8:18 AM   Specimen: BLOOD RIGHT HAND  Result  Value Ref Range Status   Specimen Description BLOOD RIGHT HAND  Final   Special Requests   Final    BOTTLES DRAWN AEROBIC ONLY Blood Culture results may not be optimal due to an inadequate volume of blood received in culture bottles   Culture   Final    NO GROWTH 4 DAYS Performed at [facility], [place] [address] [place], [place] [address]    Report Status PENDING  Incomplete  Urine culture     Status: None   Collection Time: 03/14/20  8:41 AM   Specimen: Urine, Random  Result Value Ref Range Status   Specimen Description URINE, RANDOM  Final   Special Requests NONE  Final   Culture   Final    NO GROWTH Performed at [facility], [address] [place], [place] [address]    Report Status 03/15/2020 FINAL  Final  MRSA PCR Screening     Status: None   Collection Time: 03/14/20 10:49 PM   Specimen: Nasal Mucosa; Nasopharyngeal  Result Value Ref Range Status   MRSA by PCR NEGATIVE NEGATIVE Final    Comment:        The GeneXpert MRSA Assay (FDA approved for NASAL specimens only), is one component of a comprehensive MRSA colonization surveillance program. It is not intended to diagnose MRSA infection nor to guide or monitor treatment for MRSA infections. Performed at [facility], [place] [address], [place], [place] [address]   Aerobic/Anaerobic Culture (surgical/deep wound)     Status: None (Preliminary result)   Collection Time: 03/16/20  5:13 PM   Specimen: PATH Other; Bile  Result Value Ref Range Status   Specimen Description BILE  Final   Special Requests NONE  Final   Gram Stain   Final    FEW WBC PRESENT,BOTH PMN AND MONONUCLEAR ABUNDANT GRAM POSITIVE COCCI Performed at [facility], [place] [address] [place], [place] [address]    Culture   Final    ABUNDANT ENTEROCOCCUS FAECIUM NO ANAEROBES ISOLATED; CULTURE IN PROGRESS FOR 5 DAYS    Report Status PENDING  Incomplete   Organism ID, Bacteria ENTEROCOCCUS FAECIUM  Final      Susceptibility    Enterococcus faecium - MIC*    AMPICILLIN <=2 SENSITIVE Sensitive     VANCOMYCIN <=0.5 SENSITIVE Sensitive     GENTAMICIN SYNERGY SENSITIVE Sensitive     * ABUNDANT ENTEROCOCCUS FAECIUM         Radiology Studies: No results found.      Scheduled Meds: . acetaminophen (TYLENOL) oral liquid 160 mg/5 mL  650 mg Oral Q4H  . acetaminophen  650 mg Rectal Q4H  . allopurinol  100 mg Oral BID  . amoxicillin-clavulanate  500 mg Oral Q12H  . aspirin  81 mg Oral Daily  . chlorhexidine gluconate (MEDLINE KIT)  15 mL Mouth Rinse BID  . Chlorhexidine Gluconate Cloth  6 each Topical Daily  . docusate  100 mg Oral BID  . gabapentin  300 mg Oral Q12H  . insulin aspart  0-20 Units Subcutaneous Q4H  . mouth rinse  15 mL Mouth Rinse BID  . pantoprazole  40 mg Oral BID  . polyethylene glycol  17 g Oral Daily  . QUEtiapine  50 mg Oral Q supper  . rosuvastatin  10 mg Oral QHS  . sodium chloride flush  3 mL Intravenous Q12H  . sodium chloride flush  5 mL Intracatheter Q8H  . Warfarin - Pharmacist Dosing Inpatient   Does not apply q1600   Continuous Infusions: . sodium chloride Stopped (03/17/20 2016)  . heparin 2,050 Units/hr (03/19/20 0400)     LOS: 5 days    Time spent: 35 mins.More than 50% of that time was spent in counseling and/or coordination of care.      Shelly Coss, MD [facility] P11/10/2019, 7:38 AM

## 2020-03-20 ENCOUNTER — Inpatient Hospital Stay (HOSPITAL_COMMUNITY): Payer: Medicare Other

## 2020-03-20 DIAGNOSIS — K810 Acute cholecystitis: Secondary | ICD-10-CM | POA: Diagnosis not present

## 2020-03-20 DIAGNOSIS — I5021 Acute systolic (congestive) heart failure: Secondary | ICD-10-CM | POA: Diagnosis not present

## 2020-03-20 DIAGNOSIS — A419 Sepsis, unspecified organism: Secondary | ICD-10-CM | POA: Diagnosis not present

## 2020-03-20 DIAGNOSIS — N179 Acute kidney failure, unspecified: Secondary | ICD-10-CM | POA: Diagnosis not present

## 2020-03-20 LAB — BASIC METABOLIC PANEL
Anion gap: 10 (ref 5–15)
BUN: 51 mg/dL — ABNORMAL HIGH (ref 8–23)
CO2: 27 mmol/L (ref 22–32)
Calcium: 8.5 mg/dL — ABNORMAL LOW (ref 8.9–10.3)
Chloride: 101 mmol/L (ref 98–111)
Creatinine, Ser: 1.54 mg/dL — ABNORMAL HIGH (ref 0.61–1.24)
GFR, Estimated: 46 mL/min — ABNORMAL LOW (ref >60)
Glucose, Bld: 195 mg/dL — ABNORMAL HIGH (ref 70–99)
Potassium: 4.4 mmol/L (ref 3.5–5.1)
Sodium: 138 mmol/L (ref 135–145)

## 2020-03-20 LAB — GLUCOSE, CAPILLARY
Glucose-Capillary: 135 mg/dL — ABNORMAL HIGH (ref 70–99)
Glucose-Capillary: 171 mg/dL — ABNORMAL HIGH (ref 70–99)
Glucose-Capillary: 194 mg/dL — ABNORMAL HIGH (ref 70–99)
Glucose-Capillary: 204 mg/dL — ABNORMAL HIGH (ref 70–99)
Glucose-Capillary: 201 mg/dL — ABNORMAL HIGH (ref 70–99)

## 2020-03-20 LAB — PROTIME-INR
INR: 3 — ABNORMAL HIGH (ref 0.8–1.2)
Prothrombin Time: 30.3 seconds — ABNORMAL HIGH (ref 11.4–15.2)

## 2020-03-20 MED ORDER — GUAIFENESIN-DM 100-10 MG/5ML PO SYRP
5.0000 mL | ORAL_SOLUTION | ORAL | Status: DC
  Administered 2020-03-20 – 2020-03-22 (×12): 5 mL via ORAL
  Filled 2020-03-20 (×13): qty 5

## 2020-03-20 MED ORDER — WARFARIN SODIUM 2.5 MG PO TABS
2.5000 mg | ORAL_TABLET | Freq: Once | ORAL | Status: AC
  Administered 2020-03-20: 2.5 mg via ORAL
  Filled 2020-03-20: qty 1

## 2020-03-20 MED ORDER — FUROSEMIDE 10 MG/ML IJ SOLN
40.0000 mg | Freq: Two times a day (BID) | INTRAMUSCULAR | Status: DC
  Administered 2020-03-20 (×2): 40 mg via INTRAVENOUS
  Filled 2020-03-20 (×2): qty 4

## 2020-03-20 MED ORDER — TRAMADOL HCL 50 MG PO TABS
50.0000 mg | ORAL_TABLET | Freq: Two times a day (BID) | ORAL | Status: DC | PRN
  Administered 2020-03-25 – 2020-04-04 (×7): 50 mg via ORAL
  Filled 2020-03-20 (×8): qty 1

## 2020-03-20 MED ORDER — GUAIFENESIN-DM 100-10 MG/5ML PO SYRP: 5.0000 mL | ORAL_SOLUTION | Freq: Four times a day (QID) | ORAL | Status: DC

## 2020-03-20 NOTE — Plan of Care (Signed)
  Problem: Education: Goal: Knowledge of General Education information will improve Description: Including pain rating scale, medication(s)/side effects and non-pharmacologic comfort measures Outcome: Progressing   Problem: Health Behavior/Discharge Planning: Goal: Ability to manage health-related needs will improve Outcome: Progressing   Problem: Clinical Measurements: Goal: Ability to maintain clinical measurements within normal limits will improve Outcome: Progressing Goal: Will remain free from infection Outcome: Progressing Goal: Diagnostic test results will improve Outcome: Progressing Goal: Respiratory complications will improve Outcome: Progressing Goal: Cardiovascular complication will be avoided Outcome: Progressing   Problem: Activity: Goal: Risk for activity intolerance will decrease Outcome: Progressing   Problem: Nutrition: Goal: Adequate nutrition will be maintained Outcome: Progressing   Problem: Coping: Goal: Level of anxiety will decrease Outcome: Progressing   Problem: Elimination: Goal: Will not experience complications related to bowel motility Outcome: Progressing Goal: Will not experience complications related to urinary retention Outcome: Progressing   Problem: Pain Managment: Goal: General experience of comfort will improve Outcome: Progressing   Problem: Safety: Goal: Ability to remain free from injury will improve Outcome: Progressing   Problem: Skin Integrity: Goal: Risk for impaired skin integrity will decrease Outcome: Progressing   Problem: Education: Goal: Knowledge of disease or condition will improve Outcome: Progressing Goal: Understanding of medication regimen will improve Outcome: Progressing Goal: Individualized Educational Video(s) Outcome: Progressing   Problem: Activity: Goal: Ability to tolerate increased activity will improve Outcome: Progressing   Problem: Cardiac: Goal: Ability to achieve and maintain  adequate cardiopulmonary perfusion will improve Outcome: Progressing   Problem: Health Behavior/Discharge Planning: Goal: Ability to safely manage health-related needs after discharge will improve Outcome: Progressing   Problem: Education: Goal: Knowledge of disease and its progression will improve Outcome: Progressing   Problem: Health Behavior/Discharge Planning: Goal: Ability to manage health-related needs will improve Outcome: Progressing   Problem: Clinical Measurements: Goal: Complications related to the disease process or treatment will be avoided or minimized Outcome: Progressing Goal: Dialysis access will remain free of complications Outcome: Progressing   Problem: Activity: Goal: Activity intolerance will improve Outcome: Progressing   Problem: Fluid Volume: Goal: Fluid volume balance will be maintained or improved Outcome: Progressing   Problem: Nutritional: Goal: Ability to make appropriate dietary choices will improve Outcome: Progressing   Problem: Respiratory: Goal: Respiratory symptoms related to disease process will be avoided Outcome: Progressing   Problem: Self-Concept: Goal: Body image disturbance will be avoided or minimized Outcome: Progressing   Problem: Urinary Elimination: Goal: Progression of disease will be identified and treated Outcome: Progressing   Problem: Fluid Volume: Goal: Hemodynamic stability will improve Outcome: Progressing   Problem: Clinical Measurements: Goal: Diagnostic test results will improve Outcome: Progressing Goal: Signs and symptoms of infection will decrease Outcome: Progressing   Problem: Respiratory: Goal: Ability to maintain adequate ventilation will improve Outcome: Progressing   

## 2020-03-20 NOTE — Progress Notes (Signed)
Progress Note  Patient Name: Isaiah [name] Date of Encounter: 03/20/2020  [facility] Cardiologist: No primary care provider on file.   Subjective   He complains of persistent cough with pleuritic chest pain.  Inpatient Medications    Scheduled Meds: . acetaminophen (TYLENOL) oral liquid 160 mg/5 mL  650 mg Oral Q4H  . acetaminophen  650 mg Rectal Q4H  . allopurinol  100 mg Oral BID  . amoxicillin-clavulanate  500 mg Oral Q12H  . aspirin  81 mg Oral Daily  . chlorhexidine gluconate (MEDLINE KIT)  15 mL Mouth Rinse BID  . docusate  100 mg Oral BID  . gabapentin  300 mg Oral Q12H  . guaiFENesin-dextromethorphan  5 mL Oral Q6H  . insulin aspart  0-20 Units Subcutaneous Q4H  . mouth rinse  15 mL Mouth Rinse BID  . pantoprazole  40 mg Oral BID  . QUEtiapine  50 mg Oral Q supper  . rosuvastatin  10 mg Oral QHS  . sodium chloride flush  3 mL Intravenous Q12H  . sodium chloride flush  5 mL Intracatheter Q8H  . Warfarin - Pharmacist Dosing Inpatient   Does not apply q1600   Continuous Infusions:  . sodium chloride Stopped (03/17/20 2016)   PRN Meds: hydrALAZINE, loperamide, ondansetron **OR** ondansetron (ZOFRAN) IV   Vital Signs    Vitals:   03/19/20 2033 03/19/20 2300 03/20/20 0400 03/20/20 0407  BP:  (!) 138/55 (!) 136/50 (!) 136/50  Pulse: 67 66 73 71  Resp: (!) 21 20 (!) 21 20  Temp:  98.1 F (36.7 C) 98.3 F (36.8 C) 98.3 F (36.8 C)  TempSrc:  Axillary Oral Oral  SpO2: 96% 98% 95% 99%  Weight:      Height:        Intake/Output Summary (Last 24 hours) at 03/20/2020 0939 Last data filed at 03/19/2020 2343 Gross per 24 hour  Intake --  Output 600 ml  Net -600 ml   Last 3 Weights 03/18/2020 03/14/2020 03/14/2020  Weight (lbs) 217 lb 2.5 oz 207 lb 10.8 oz 210 lb  Weight (kg) 98.5 kg 94.2 kg 95.255 kg     Telemetry    Atrial fibrillation, rate controlled HR 60-70' bpm, frequent PVCs - Personally Reviewed  Physical Exam   Intubated, obese male,  awakens to verbal stimuli and able to follow simple commands GEN: No acute distress.   Neck: No JVD Cardiac: irregular, no murmurs, rubs, or gallops.  Respiratory: Clear to auscultation bilaterally. GI: Soft, nontender, non-distended  MS: No edema; No deformity. Feet warmer today. Neuro:  Nonfocal  Psych: unable to assess  Labs    High Sensitivity Troponin:   Recent Labs  Lab 03/15/20 1705 03/15/20 1916 03/16/20 0008 03/16/20 0309  TROPONINIHS 61* 308* 982* 1,364*     Chemistry Recent Labs  Lab 03/14/20 0743 03/15/20 0027 03/17/20 0357 03/18/20 0229 03/19/20 0053  NA 130*   < > 136 138 140  K 3.5   < > 3.0* 4.0 4.1  CL 78*   < > 95* 98 100  CO2 35*   < > '29 28 28  ' GLUCOSE 306*   < > 132* 111* 118*  BUN 73*   < > 91* 78* 66*  CREATININE 3.45*   < > 2.51* 2.26* 1.96*  CALCIUM 10.0   < > 8.2* 8.2* 8.5*  PROT 7.8  --   --   --   --   ALBUMIN 2.8*  --   --   --   --  AST 24  --   --   --   --   ALT 13  --   --   --   --   ALKPHOS 82  --   --   --   --   BILITOT 1.1  --   --   --   --   GFRNONAA 17*   < > 25* 29* 34*  ANIONGAP 17*   < > '12 12 12   ' < > = values in this interval not displayed.    Hematology Recent Labs  Lab 03/17/20 0357 03/18/20 0229 03/19/20 0053  WBC 9.6 9.2 8.2  RBC 3.29* 3.12* 3.15*  HGB 11.1* 10.5* 10.5*  HCT 33.2* 32.8* 33.4*  MCV 100.9* 105.1* 106.0*  MCH 33.7 33.7 33.3  MCHC 33.4 32.0 31.4  RDW 13.9 14.3 14.2  PLT 145* 143* 143*   BNP Recent Labs  Lab 03/14/20 0752  BNP 532.7*    DDimer No results for input(s): DDIMER in the last 168 hours.   Radiology    No results found.  Cardiac Studies   2D Echo as above  Patient Profile     79 y.o. male with acute cholecystitis and PEA arrest, noted to have severe LV dysfunction post-arrest and also elevated troponin consistent with demand ischemia of the myocardium  Assessment & Plan    1. Acute on chronic systolic heart failure with cardiogenic shock: off pressors,  extubated. Multiple issues with AKI, afib, severe LV dysfunction. Not a candidate for ACE/ARB/aldo antagonist/beta blocker because of bradycardia, AKI. I will continue low dose lasix 40 mg iv BID,  - add dextromethorphan, guaifenesin for cough, obtain CXR to further evaluate   2. Demand ischemia of the myocardium: known underlying CAD s/p CABG and PCI, with PEA arrest. Troponin peak at 1364 (in setting AKI) does not suggest large infarct. Medical therapy appropriate, not a candidate for invasive evaluation with cath.   3. Atrial fibrillation: slow ventricular rate this am. Multiple episodes of bradycardia down to 30' but asymptomatic. Suspect longstanding AF, but history limited. Pt has been on chronic warfarin. IV amiodarone was discontinued with slow ventricular rate. Add beta-blocker when HR/BP will allow.   4. Ac on chr kidney failure - Crea improving 2.51 --> 2.26 --> 1.96 -->1.54.  For questions or updates, please contact [place] Please consult [email or web address] for contact info under     Signed, Ena Dawley, MD  03/20/2020, 9:39 AM

## 2020-03-20 NOTE — Progress Notes (Signed)
Referring Physician(s): Dr Renford Dills  Supervising Physician: Ruel Favors  Patient Status:  [facility] - In-pt  Chief Complaint:  Cholecystitis   Subjective:  Percutaneous cholecystostomy drain placed in IR 11/3 Up in bed In great spirits Mild pleasant confusion   Allergies: Patient has no known allergies.  Medications: Prior to Admission medications   Medication Sig Start Date End Date Taking? Authorizing Provider  acetaminophen (TYLENOL) 500 MG tablet Take 500-1,000 mg by mouth every 6 (six) hours as needed for mild pain or headache.   Yes [provider]  allopurinol (ZYLOPRIM) 100 MG tablet TAKE 2 TABLETS BY MOUTH DAILY. Patient taking differently: Take 100 mg by mouth 2 (two) times daily.  05/03/14  Yes Runell Gess, MD  colchicine 0.6 MG tablet Take 0.6 mg by mouth daily as needed (gout attacks).   Yes [provider]  ferrous sulfate 325 (65 FE) MG tablet Take 1 tablet (325 mg total) by mouth 2 (two) times daily with a meal. Patient taking differently: Take 325 mg by mouth daily with breakfast.  08/09/16  Yes Ghimire, Werner Lean, MD  furosemide (LASIX) 40 MG tablet Take 1 tablet (40 mg total) by mouth 2 (two) times daily. Patient taking differently: Take 40 mg by mouth in the morning.  08/24/16  Yes Osvaldo Shipper, MD  gabapentin (NEURONTIN) 300 MG capsule Take 300-600 mg by mouth See admin instructions. Take 300 mg by mouth three times a day and 600 mg at bedtime   Yes [provider]  glipiZIDE (GLUCOTROL) 5 MG tablet Take 5 mg by mouth 2 (two) times daily before a meal.   Yes [provider]  hydrALAZINE (APRESOLINE) 100 MG tablet Take 100 mg by mouth 2 (two) times daily.   Yes [provider]  insulin aspart protamine- aspart (NOVOLOG MIX 70/30) (70-30) 100 UNIT/ML injection Inject 50 Units into the skin 2 (two) times daily before a meal.    Yes [provider]  isosorbide mononitrate (IMDUR) 60 MG 24 hr tablet  Take 1 tablet (60 mg total) by mouth daily. Appointment needed for future refills Patient taking differently: Take 60 mg by mouth daily.  05/03/15  Yes Runell Gess, MD  metolazone (ZAROXOLYN) 2.5 MG tablet Take 2.5 mg by mouth daily as needed (AS DIRECTED FOR MARKED SWELLING).   Yes [provider]  Multiple Vitamin (MULTIVITAMIN WITH MINERALS) TABS tablet Take 1 tablet by mouth daily.   Yes [provider]  nitroGLYCERIN (NITROSTAT) 0.4 MG SL tablet Place 0.4 mg under the tongue every 5 (five) minutes as needed for chest pain.   Yes [provider]  pantoprazole (PROTONIX) 40 MG tablet Take 40 mg by mouth 2 (two) times daily before a meal.    Yes [provider]  potassium chloride (K-DUR) 10 MEQ tablet Take 1 tablet (10 mEq total) by mouth daily. 08/31/16  Yes Barrett, Joline Salt, PA-C  pravastatin (PRAVACHOL) 40 MG tablet Take 40 mg by mouth at bedtime.    Yes [provider]  PRESCRIPTION MEDICATION Inhale into the lungs See admin instructions. CPAP- At bedtime and during any time of rest   Yes [provider]  Psyllium (METAMUCIL PO) Take 2 capsules by mouth at bedtime.   Yes [provider]  QUEtiapine (SEROQUEL) 50 MG tablet Take 50 mg by mouth at bedtime.    Yes [provider]  warfarin (COUMADIN) 1 MG tablet Take 1 mg by mouth daily as needed (in conjunction with the  5 mg strength, AS DIRECTED- depending on INR).  12/18/19  Yes [provider]  warfarin (COUMADIN) 5 MG tablet Take 5 mg by mouth daily with supper. 01/28/20  Yes [provider]  docusate sodium (COLACE) 100 MG capsule Take 1 capsule (100 mg total) by mouth 2 (two) times daily. Patient not taking: Reported on 03/14/2020 12/16/18   Jadene Pierini, MD  oxyCODONE (OXY IR/ROXICODONE) 5 MG immediate release tablet Take 1 tablet (5 mg total) by mouth every 4 (four) hours as needed (pain). Patient not taking: Reported on 03/14/2020 12/16/18    Jadene Pierini, MD     Vital Signs: BP (!) 136/50 (BP Location: Right Arm)   Pulse 71   Temp 98.3 F (36.8 C) (Oral)   Resp 20   Ht 5\' 9"  (1.753 m)   Wt 217 lb 2.5 oz (98.5 kg)   SpO2 99%   BMI 32.07 kg/m   Physical Exam Skin:    General: Skin is warm.     Comments: Site is clean and dry NT no bleeding OP bilious/bloody Organism ID, Bacteria ENTEROCOCCUS FAECIUM   100 cc yesterday 15 cc in bag this am  Neurological:     Mental Status: He is alert.     Imaging: IR Perc Cholecystostomy  Result Date: 03/16/2020 INDICATION: 79 year old male with acute calculus cholecystitis and florid sepsis. He presents for second attempt at percutaneous cholecystostomy tube placement after being stabilized from acute cardiac arrest. EXAM: CHOLECYSTOSTOMY MEDICATIONS: In patient currently receiving intravenous Zosyn. No additional antibiotic prophylaxis administered. ANESTHESIA/SEDATION: Moderate (conscious) sedation was employed during this procedure. A total of Versed 1 mg and Fentanyl 25 mcg was administered intravenously. Moderate Sedation Time: 13 minutes minutes. The patient's level of consciousness and vital signs were monitored continuously by radiology nursing throughout the procedure under my direct supervision. FLUOROSCOPY TIME:  Fluoroscopy Time: 0 minutes 36 seconds (5 mGy). COMPLICATIONS: None immediate. PROCEDURE: Informed written consent was obtained from the patient after a thorough discussion of the procedural risks, benefits and alternatives. All questions were addressed. Maximal Sterile Barrier Technique was utilized including caps, mask, sterile gowns, sterile gloves, sterile drape, hand hygiene and skin antiseptic. A timeout was performed prior to the initiation of the procedure. The right upper quadrant was interrogated with ultrasound. Loops of colon can be seen interposed between the abdominal wall and liver. A suitable skin entry site that would facilitate a transhepatic  access without transgressing the colon was identified. Local anesthesia was attained by infiltration with 1% lidocaine. A small dermatotomy was made. Under real-time ultrasound guidance, a 21 gauge Accustick needle was advanced through the liver and into the gallbladder lumen. The stylet was removed and there was return of dark brown bile. A 0.018 wire was advanced in the gallbladder lumen. The Accustick needle was then exchanged for the transitional Accustick sheath which was advanced into the gallbladder lumen. Contrast injection confirms opacification of the gallbladder lumen. A 0.035 J wire was then advanced through the Accustick sheath and into the gallbladder. The Accustick sheath was removed. The transhepatic tract was percutaneously dilated to 10 76. A Cook 10.2 [place] all-purpose drainage catheter was advanced over the wire and formed in the gallbladder. Aspiration yields dark brown viscous bile. A sample was obtained and sent for culture. The catheter was gently flushed and connected to gravity bag drainage. The catheter was secured to the skin with 0 Prolene suture. IMPRESSION: Successful placement of percutaneous transhepatic cholecystostomy tube for acute calculus cholecystitis in a non  operative candidate. Electronically Signed   By: Malachy Moan M.D.   On: 03/16/2020 16:19    Labs:  CBC: Recent Labs    03/16/20 0309 03/16/20 0309 03/16/20 0347 03/17/20 0357 03/18/20 0229 03/19/20 0053  WBC 14.6*  --   --  9.6 9.2 8.2  HGB 11.8*   < > 12.6* 11.1* 10.5* 10.5*  HCT 35.8*   < > 37.0* 33.2* 32.8* 33.4*  PLT 179  --   --  145* 143* 143*   < > = values in this interval not displayed.    COAGS: Recent Labs    03/14/20 0911 03/15/20 0643 03/15/20 1757 03/18/20 0229 03/19/20 0053 03/20/20 0857  INR 2.7*   < > 1.5* 1.5* 1.6* 3.0*  APTT 44*  --  35  --   --   --    < > = values in this interval not displayed.    BMP: Recent Labs    03/17/20 0357 03/18/20 0229  03/19/20 0053 03/20/20 0857  NA 136 138 140 138  K 3.0* 4.0 4.1 4.4  CL 95* 98 100 101  CO2 29 28 28 27   GLUCOSE 132* 111* 118* 195*  BUN 91* 78* 66* 51*  CALCIUM 8.2* 8.2* 8.5* 8.5*  CREATININE 2.51* 2.26* 1.96* 1.54*  GFRNONAA 25* 29* 34* 46*    LIVER FUNCTION TESTS: Recent Labs    03/14/20 0743  BILITOT 1.1  AST 24  ALT 13  ALKPHOS 82  PROT 7.8  ALBUMIN 2.8*    Assessment and Plan:  Perc chole drain in ir 11/3 Up in bed with son at bedside Feels better Plan per [facility] Drain to remain 6-8 weeks - unless OR  Electronically Signed: 13/3, PA-C 03/20/2020, 11:36 AM   I spent a total of 15 Minutes at the the patient's bedside AND on the patient's hospital floor or unit, greater than 50% of which was counseling/coordinating care for perc chole drain

## 2020-03-20 NOTE — Progress Notes (Addendum)
PROGRESS NOTE    Isaiah [name]  OEU:[id number] DOB: 14-Nov-1940 DOA: 03/14/2020 PCP: [name], [name], MD   Chief Complain: Weakness, pressure ulcer  Brief Narrative:  Patient is a 79 year old male with history of peripheral vascular disease, coronary artery disease, hypertension, hyperlipidemia, diabetes type 2, A. fib on Coumadin, systolic congestive heart failure who presented with weakness and pressure ulcer on the buttock from home.  He lives with his daughter.  He was also complaining of right upper quadrant pain.  On presentation, severe sepsis was suspected.  He had AKI, fever, leukocytosis.  CT was concerning for gallbladder pathology.  Patient was started on broad start antibiotics and IV fluids.  Patient hospital course remarkable for septic shock needing transfer to ICU for pressor support.  He underwent drain placement in the right upper quadrant by IR for calculus cholecystitis.  Currently hemodynamically stable. Tentative plan for discharge to home tomorrow.  Assessment & Plan:   Principal Problem:   Sepsis due to undetermined organism [facility]) Active Problems:   Coronary artery disease   Chronic atrial fibrillation (HCC)   Essential hypertension   Hyperlipidemia   Type 2 diabetes mellitus with vascular disease (HCC)   Obstructive sleep apnea   CKD (chronic kidney disease), stage III ([place])   ARF (acute renal failure) (HCC)   Pressure injury of skin   Acute cholecystitis   Class 1 obesity due to excess calories with body mass index (BMI) of 33.0 to 33.9 in adult   Shock [facility])   Cardiac arrest ([place])   Acute systolic heart failure (HCC)   Severe sepsis with septic shock: Presented with fever, leukocytosis, AKI, encephalopathy.  Hospital course remarkable for septic shock needing pressor support and transferred to ICU service.  Continue current antibiotics.  Blood Cultures so far negative.  Currently hemodynamically stable.  Acute cholecystitis: Presented with right upper  quadrant pain.  Imaging showed distended gallbladder with layering gallstone and mild pericholestatic stranding.  Due to severity of illness, he underwent percutaneous cholecystostomy by IR with drain placement.  Continue current antibiotics.  Currently on Augmentin.  He was earlier treated with Zosyn.  Continue pain management and supportive care. Aerobic/aerobic culture showed Enterococcus faecium. On discharge to home ,he will go with the drain.  Acute respiratory failure with hypoxia: Secondary to sepsis, underlying OSA.  Had to be intubated and was under as PCCM service.  Currently extubated.  He is on oxygen at home.  And he is at baseline oxygen requirement.  AKI on CKD stage IIIb: Baseline creatinine around 1.6.  Presented with severe AKI which is improving.  AKI is secondary to prerenal failure secondary to dehydration, sepsis, septic shock.  IV fluids have been stopped due to presence of severe congestive heart failure.  He is on diuretics at home which are on hold but will be resumed on discharge.  Avoid nephrotoxins.  Currently kidney function at baseline.  Acute encephalopathy: Secondary to sepsis.  Currently alert and oriented.He is HOH  Cough: Most likely secondary to congestive heart failure.  No signs of pneumonia.  Will check chest x-ray.  Continue current medications  Type II NSTEMI/elevated troponin: Supply demand ischemia from sepsis/septic shock.  Cardiology were consulted with no plan for any intervention.  Severe acute on chronic systolic congestive heart failure: TTE showed reduced ejection fraction of 20% with RV dilation.  Cardiology following.  ACE/ARB on hold due to AKI.  He is also on Lasix and metolazone at home which is on hold due to septic  shock.On V lasix since11/6/21.  We will resume diuretics on discharge  Pressure injury on the buttocks: Present on admission.  Wound care consulted.  Has a grade 2 pressure ulcers on the bilateral buttocks.  Urinary retention:  resolved.Continue intermittent straight cath if needed  Coronary artery disease: No complaint of chest pain.  Continue Imdur.  Follow-up with cardiology as an outpatient.  Status post CABG and PCI with stents.  Permanent A. fib: Currently rate is controlled.  On Coumadin for anticoagulation.  He was also on IV heparin for NSTEMI.  Continue to monitor INR.  Hospital course remarkable for bradycardia episodes.  Amiodarone discontinued in the setting of bradycardia.  Not on beta-blockers.   Hypertension: Currently blood pressure stable.  Continue current medications  Hyperlipidemia: On Crestor  Diabetes type 2: Poorly controlled.  Continue current insulin regimen.  Resume home regimen on discharge.  Diabetic coordinator was consulted.  Hemoglobin A1c of 12.1  OSA: On CPAP  Obesity: BMI of 32.  Debility/deconditioning: PT/OT consulted skilled nursing facility on discharge.  Patient lives with his daughter.  Daughter Butch Penny wants to take him home with home health  Pressure Injury 03/14/20 Buttocks Right Stage 2 -  Partial thickness loss of dermis presenting as a shallow open injury with a red, pink wound bed without slough. (Active)  03/14/20 2302  Location: Buttocks  Location Orientation: Right  Staging: Stage 2 -  Partial thickness loss of dermis presenting as a shallow open injury with a red, pink wound bed without slough.  Wound Description (Comments):   Present on Admission: Yes     Pressure Injury 03/14/20 Buttocks Left Stage 2 -  Partial thickness loss of dermis presenting as a shallow open injury with a red, pink wound bed without slough. (Active)  03/14/20 2300  Location: Buttocks  Location Orientation: Left  Staging: Stage 2 -  Partial thickness loss of dermis presenting as a shallow open injury with a red, pink wound bed without slough.  Wound Description (Comments):   Present on Admission: Yes       Nutrition Problem: Increased nutrient needs Etiology: wound healing        DVT prophylaxis: Warfarin Code Status: Full code Family Communication: Son at bedside, Daughter on phone on 03/20/20 Status is: Inpatient  Remains inpatient appropriate because:Inpatient level of care appropriate due to severity of illness   Dispo: The patient is from: Home              Anticipated d/c is EX:HBZJ with home health              Anticipated d/c date is:1 day              Patient currently is not medically stable to d/c.      Consultants: PCCM, IR, surgery, cardiology  Procedures: Cholecystostomy drain placement  Antimicrobials:  Anti-infectives (From admission, onward)   Start     Dose/Rate Route Frequency Ordered Stop   03/18/20 2200  amoxicillin-clavulanate (AUGMENTIN) 500-125 MG per tablet 500 mg  Status:  Discontinued        1 tablet Oral Every 12 hours 03/18/20 1459 03/18/20 1502   03/18/20 2200  amoxicillin-clavulanate (AUGMENTIN) 250-62.5 MG/5ML suspension 500 mg        500 mg Oral Every 12 hours 03/18/20 1502 03/21/20 0959   03/15/20 2200  piperacillin-tazobactam (ZOSYN) IVPB 3.375 g        3.375 g 12.5 mL/hr over 240 Minutes Intravenous Every 8 hours 03/15/20 1755 03/18/20 1852  03/15/20 1000  ceFEPIme (MAXIPIME) 2 g in sodium chloride 0.9 % 100 mL IVPB  Status:  Discontinued        2 g 200 mL/hr over 30 Minutes Intravenous Every 24 hours 03/14/20 1020 03/15/20 0843   03/15/20 0930  cefTRIAXone (ROCEPHIN) 2 g in sodium chloride 0.9 % 100 mL IVPB  Status:  Discontinued        2 g 200 mL/hr over 30 Minutes Intravenous Every 24 hours 03/15/20 0844 03/15/20 1755   03/14/20 1600  metroNIDAZOLE (FLAGYL) IVPB 500 mg  Status:  Discontinued        500 mg 100 mL/hr over 60 Minutes Intravenous Every 8 hours 03/14/20 1427 03/15/20 1755   03/14/20 0800  ceFEPIme (MAXIPIME) 2 g in sodium chloride 0.9 % 100 mL IVPB        2 g 200 mL/hr over 30 Minutes Intravenous  Once 03/14/20 0752 03/14/20 0845   03/14/20 0800  metroNIDAZOLE (FLAGYL) IVPB 500 mg        500  mg 100 mL/hr over 60 Minutes Intravenous  Once 03/14/20 0752 03/14/20 0935      Subjective:  Patient seen and examined at the bedside this morning.  Hemodynamically stable.  Comfortable during my evaluation but is bothered by productive cough today.  No fever or chills.  Diarrhea has stopped    Objective: Vitals:   03/19/20 2033 03/19/20 2300 03/20/20 0400 03/20/20 0407  BP:  (!) 138/55 (!) 136/50 (!) 136/50  Pulse: 67 66 73 71  Resp: (!) 21 20 (!) 21 20  Temp:  98.1 F (36.7 C) 98.3 F (36.8 C) 98.3 F (36.8 C)  TempSrc:  Axillary Oral Oral  SpO2: 96% 98% 95% 99%  Weight:      Height:        Intake/Output Summary (Last 24 hours) at 03/20/2020 0748 Last data filed at 03/19/2020 2343 Gross per 24 hour  Intake --  Output 600 ml  Net -600 ml   Filed Weights   03/14/20 0810 03/14/20 2205 03/18/20 0400  Weight: 95.3 kg 94.2 kg 98.5 kg    Examination:  General exam: Comfortable, deconditioned/debilitated, pleasant elderly male, obese Respiratory system: Diminished air sounds bilaterally but no wheezes or crackles. Cardiovascular system: Irregularly irregular rhythm. No JVD, murmurs, rubs, gallops or clicks. Gastrointestinal system: Abdomen is nondistended, soft and nontender. No organomegaly or masses felt. Normal bowel sounds heard. Central nervous system: Alert and oriented. No focal neurological deficits. Extremities:No lower extremity edema, no clubbing ,no cyanosis Skin: Pressure injury on the buttocks    Data Reviewed: I have personally reviewed following labs and imaging studies  CBC: Recent Labs  Lab 03/14/20 0743 03/15/20 0027 03/15/20 1650 03/15/20 1802 03/16/20 0309 03/16/20 0347 03/17/20 0357 03/18/20 0229 03/19/20 0053  WBC 21.2*   < > 21.3*  --  14.6*  --  9.6 9.2 8.2  NEUTROABS 18.6*  --   --   --   --   --   --   --   --   HGB 13.9   < > 11.8*   < > 11.8* 12.6* 11.1* 10.5* 10.5*  HCT 43.4   < > 36.8*   < > 35.8* 37.0* 33.2* 32.8* 33.4*  MCV  105.3*   < > 105.1*  --  100.8*  --  100.9* 105.1* 106.0*  PLT 141*   < > 142*  --  179  --  145* 143* 143*   < > = values in this interval not  displayed.   Basic Metabolic Panel: Recent Labs  Lab 03/15/20 0027 03/15/20 0027 03/15/20 1650 03/15/20 1757 03/15/20 1802 03/16/20 0309 03/16/20 0347 03/17/20 0357 03/18/20 0229 03/19/20 0053  NA 131*   < > 135  --    < > 131* 132* 136 138 140  K 3.0*   < > 3.3*  --    < > 4.1 4.2 3.0* 4.0 4.1  CL 86*   < > 90*  --   --  90*  --  95* 98 100  CO2 33*   < > 34*  --   --  25  --  '29 28 28  ' GLUCOSE 207*   < > 198*  --   --  309*  --  132* 111* 118*  BUN 83*   < > 85*  --   --  92*  --  91* 78* 66*  CREATININE 2.75*   < > 2.53*  --   --  2.47*  --  2.51* 2.26* 1.96*  CALCIUM 9.2   < > 8.4*  --   --  8.3*  --  8.2* 8.2* 8.5*  MG 2.6*  --   --  2.5*  --  2.3  --  2.5*  --   --   PHOS  --   --   --   --   --  3.7  --   --   --   --    < > = values in this interval not displayed.   GFR: Estimated Creatinine Clearance: 35.4 mL/min (A) (by C-G formula based on SCr of 1.96 mg/dL (H)). Liver Function Tests: Recent Labs  Lab 03/14/20 0743  AST 24  ALT 13  ALKPHOS 82  BILITOT 1.1  PROT 7.8  ALBUMIN 2.8*   Recent Labs  Lab 03/14/20 0818  LIPASE 35   Recent Labs  Lab 03/14/20 0744  AMMONIA 17   Coagulation Profile: Recent Labs  Lab 03/14/20 0911 03/15/20 0643 03/15/20 1757 03/18/20 0229 03/19/20 0053  INR 2.7* 1.5* 1.5* 1.5* 1.6*   Cardiac Enzymes: No results for input(s): CKTOTAL, CKMB, CKMBINDEX, TROPONINI in the last 168 hours. BNP (last 3 results) No results for input(s): PROBNP in the last 8760 hours. HbA1C: No results for input(s): HGBA1C in the last 72 hours. CBG: Recent Labs  Lab 03/19/20 1138 03/19/20 1537 03/19/20 1948 03/19/20 2338 03/20/20 0341  GLUCAP 167* 191* 173* 138* 135*   Lipid Profile: No results for input(s): CHOL, HDL, LDLCALC, TRIG, CHOLHDL, LDLDIRECT in the last 72 hours. Thyroid Function  Tests: No results for input(s): TSH, T4TOTAL, FREET4, T3FREE, THYROIDAB in the last 72 hours. Anemia Panel: No results for input(s): VITAMINB12, FOLATE, FERRITIN, TIBC, IRON, RETICCTPCT in the last 72 hours. Sepsis Labs: Recent Labs  Lab 03/14/20 0743 03/14/20 0818 03/14/20 2125 03/15/20 0027 03/15/20 1757 03/15/20 2231  PROCALCITON  --  7.85  --   --   --   --   LATICACIDVEN   < >  --  1.2 0.9 2.6* 2.8*   < > = values in this interval not displayed.    Recent Results (from the past 240 hour(s))  Blood culture (routine x 2)     Status: None   Collection Time: 03/14/20  7:44 AM   Specimen: BLOOD LEFT ARM  Result Value Ref Range Status   Specimen Description BLOOD LEFT ARM  Final   Special Requests   Final    BOTTLES DRAWN AEROBIC AND ANAEROBIC Blood Culture  adequate volume   Culture   Final    NO GROWTH 5 DAYS Performed at [facility], [place] [address], [place], [place] [address]    Report Status 03/19/2020 FINAL  Final  Respiratory Panel by RT PCR (Flu A&B, Covid) - Nasopharyngeal Swab     Status: None   Collection Time: 03/14/20  8:03 AM   Specimen: Nasopharyngeal Swab  Result Value Ref Range Status   SARS Coronavirus 2 by RT PCR NEGATIVE NEGATIVE Final    Comment: (NOTE) SARS-CoV-2 target nucleic acids are NOT DETECTED.  The SARS-CoV-2 RNA is generally detectable in upper respiratoy specimens during the acute phase of infection. The lowest concentration of SARS-CoV-2 viral copies this assay can detect is 131 copies/mL. A negative result does not preclude SARS-Cov-2 infection and should not be used as the sole basis for treatment or other patient management decisions. A negative result may occur with  improper specimen collection/handling, submission of specimen other than nasopharyngeal swab, presence of viral mutation(s) within the areas targeted by this assay, and inadequate number of viral copies (<131 copies/mL). A negative result must be combined with  clinical observations, patient history, and epidemiological information. The expected result is Negative.  Fact Sheet for Patients:  PinkCheek.be  Fact Sheet for Healthcare Providers:  GravelBags.it  This test is no [name] yet approved or cleared by the [place] FDA and  has been authorized for detection and/or diagnosis of SARS-CoV-2 by FDA under an Emergency Use Authorization (EUA). This EUA will remain  in effect (meaning this test can be used) for the duration of the COVID-19 declaration under Section 564(b)(1) of the Act, 21 U.S.C. section 360bbb-3(b)(1), unless the authorization is terminated or revoked sooner.     Influenza A by PCR NEGATIVE NEGATIVE Final   Influenza B by PCR NEGATIVE NEGATIVE Final    Comment: (NOTE) The Xpert Xpress SARS-CoV-2/FLU/RSV assay is intended as an aid in  the diagnosis of influenza from Nasopharyngeal swab specimens and  should not be used as a sole basis for treatment. Nasal washings and  aspirates are unacceptable for Xpert Xpress SARS-CoV-2/FLU/RSV  testing.  Fact Sheet for Patients: PinkCheek.be  Fact Sheet for Healthcare Providers: GravelBags.it  This test is not yet approved or cleared by the [place] FDA and  has been authorized for detection and/or diagnosis of SARS-CoV-2 by  FDA under an Emergency Use Authorization (EUA). This EUA will remain  in effect (meaning this test can be used) for the duration of the  Covid-19 declaration under Section 564(b)(1) of the Act, 21  U.S.C. section 360bbb-3(b)(1), unless the authorization is  terminated or revoked. Performed at [facility], [place] [address], [place], [place] [address]   Blood culture (routine x 2)     Status: None   Collection Time: 03/14/20  8:18 AM   Specimen: BLOOD RIGHT HAND  Result Value Ref Range Status   Specimen Description BLOOD RIGHT HAND   Final   Special Requests   Final    BOTTLES DRAWN AEROBIC ONLY Blood Culture results may not be optimal due to an inadequate volume of blood received in culture bottles   Culture   Final    NO GROWTH 5 DAYS Performed at [facility], [place] [address], [place], [place] [address]    Report Status 03/19/2020 FINAL  Final  Urine culture     Status: None   Collection Time: 03/14/20  8:41 AM   Specimen: Urine, Random  Result Value  Ref Range Status   Specimen Description URINE, RANDOM  Final   Special Requests NONE  Final   Culture   Final    NO GROWTH Performed at [facility], [address], [place], [place] [address]    Report Status 03/15/2020 FINAL  Final  MRSA PCR Screening     Status: None   Collection Time: 03/14/20 10:49 PM   Specimen: Nasal Mucosa; Nasopharyngeal  Result Value Ref Range Status   MRSA by PCR NEGATIVE NEGATIVE Final    Comment:        The GeneXpert MRSA Assay (FDA approved for NASAL specimens only), is one component of a comprehensive MRSA colonization surveillance program. It is not intended to diagnose MRSA infection nor to guide or monitor treatment for MRSA infections. Performed at [facility], [place] [address] [place], [place] [address]   Aerobic/Anaerobic Culture (surgical/deep wound)     Status: None (Preliminary result)   Collection Time: 03/16/20  5:13 PM   Specimen: PATH Other; Bile  Result Value Ref Range Status   Specimen Description BILE  Final   Special Requests NONE  Final   Gram Stain   Final    FEW WBC PRESENT,BOTH PMN AND MONONUCLEAR ABUNDANT GRAM POSITIVE COCCI Performed at [facility], [place] [address], [place], [place] [address]    Culture   Final    ABUNDANT ENTEROCOCCUS FAECIUM NO ANAEROBES ISOLATED; CULTURE IN PROGRESS FOR 5 DAYS    Report Status PENDING  Incomplete   Organism ID, Bacteria ENTEROCOCCUS FAECIUM  Final      Susceptibility   Enterococcus faecium - MIC*    AMPICILLIN <=2 SENSITIVE  Sensitive     VANCOMYCIN <=0.5 SENSITIVE Sensitive     GENTAMICIN SYNERGY SENSITIVE Sensitive     * ABUNDANT ENTEROCOCCUS FAECIUM         Radiology Studies: No results found.      Scheduled Meds: . acetaminophen (TYLENOL) oral liquid 160 mg/5 mL  650 mg Oral Q4H  . acetaminophen  650 mg Rectal Q4H  . allopurinol  100 mg Oral BID  . amoxicillin-clavulanate  500 mg Oral Q12H  . aspirin  81 mg Oral Daily  . chlorhexidine gluconate (MEDLINE KIT)  15 mL Mouth Rinse BID  . docusate  100 mg Oral BID  . gabapentin  300 mg Oral Q12H  . insulin aspart  0-20 Units Subcutaneous Q4H  . mouth rinse  15 mL Mouth Rinse BID  . pantoprazole  40 mg Oral BID  . QUEtiapine  50 mg Oral Q supper  . rosuvastatin  10 mg Oral QHS  . sodium chloride flush  3 mL Intravenous Q12H  . sodium chloride flush  5 mL Intracatheter Q8H  . Warfarin - Pharmacist Dosing Inpatient   Does not apply q1600   Continuous Infusions: . sodium chloride Stopped (03/17/20 2016)     LOS: 6 days    Time spent: 35 mins.More than 50% of that time was spent in counseling and/or coordination of care.      Shelly Coss, MD [facility] P11/11/2019, 7:48 AM

## 2020-03-20 NOTE — TOC Initial Note (Addendum)
Transition of Care (TOC) - Initial/Assessment Note  Donn Pierini RN, BSN Transitions of Care Unit 4E- RN Case Manager See Treatment Team for direct phone # Weekend cross coverage for 2C   Patient Details  Name: Isaiah BISCHOF MRN: [id number] Date of Birth: [birth year]  Transition of Care [facility]) CM/SW Contact:    Darrold Span, RN Phone Number: 03/20/2020, 2:26 PM  Clinical Narrative:                 Notified by MD that family wants to take pt home with [facility]). Call made to daughter Lupita Leash to discuss transition of care needs.  Per TC conversation with Lupita Leash confirmed family does not want SNF and wants [facility] services with [facility] as they have used them for [facility] services in the past.  Lupita Leash reports that pt will have 24/7 assistance with family support.  DME in the home includes walker and wheelchair- per Lupita Leash pt will need hospital bed and hoyer lift. Pt also did not have home 02 PTA and will need home 02 set up if he is still requiring oxygen. Lupita Leash also reports pt has been on oxygen in the past with [facility] so they are agreeable to using them again for home 02 needs.   Discussed HH and per Lupita Leash they have used [facility] in past and would like to use them again- call made to oncall RN Dondra Spry who request for referral to be called in tomorrow during regular business hours to check and see if they can staff the referral needs for RN/PT/OT  Lupita Leash requesting [facility] transport home- discussed insurance coverage and payment- Lupita Leash states she will pay out of pocket if insurance does not cover expense.   Notified MD for DME orders, Call made to [employer] [facility] for referral [facility] bed and lift.    Expected Discharge Plan: Skilled Nursing Facility Barriers to Discharge: Continued Medical Work up   Patient Goals and CMS Choice Patient states their goals for this hospitalization and ongoing recovery are:: return home with family CMS Medicare.gov Compare Post  Acute Care list provided to:: Patient Represenative (must comment) (daughter-Donna) Choice offered to / list presented to : Adult Children  Expected Discharge Plan and Services Expected Discharge Plan: Skilled Nursing Facility   Discharge Planning Services: CM Consult Post Acute Care Choice: Durable Medical Equipment, Home Health Living arrangements for the past 2 months: Single Family Home                 DME Arranged: Hospital bed, Other see comment DME Agency: [employer]         [facility] Agency: [facility]        Prior Living Arrangements/Services Living arrangements for the past 2 months: Single Family Home Lives with:: Adult Children Patient language and need for interpreter reviewed:: Yes Do you feel safe going back to the place where you live?: Yes      Need for Family Participation in Patient Care: Yes (Comment) Care giver support system in place?: Yes (comment) Current home services: DME Criminal Activity/Legal Involvement Pertinent to Current Situation/Hospitalization: No - Comment as needed  Activities of Daily Living Home Assistive Devices/Equipment: None ADL Screening (condition at time of admission) Is the patient deaf or have difficulty hearing?: Yes Does the patient have difficulty seeing, even when wearing glasses/contacts?: Yes Does the patient have difficulty concentrating, remembering, or making decisions?: Yes Does the patient have difficulty dressing or bathing?: Yes Does  the patient have difficulty walking or climbing stairs?: Yes  Permission Sought/Granted Permission sought to share information with : Oceanographer granted to share information with : Yes, Verbal Permission Granted     Permission granted to share info w AGENCY: DME/HH agencies        Emotional Assessment Appearance:: Appears stated age       Alcohol / Substance Use: Not Applicable Psych Involvement: No  (comment)  Admission diagnosis:  Acute cholecystitis [K81.0] Cholecystitis [K81.9] Encephalopathy [G93.40] Hypoxia [R09.02] Elevated brain natriuretic peptide (BNP) level [R79.89] Acute kidney injury (HCC) [N17.9] Sepsis due to undetermined organism (HCC) [A41.9] Sepsis with acute renal failure without septic shock, due to unspecified organism, unspecified acute renal failure type (HCC) [A41.9, R65.20, N17.9] Patient Active Problem List   Diagnosis Date Noted  . Acute systolic heart failure (HCC)   . Shock (HCC)   . Cardiac arrest (HCC)   . Sepsis due to undetermined organism (HCC) 03/14/2020  . Acute cholecystitis 03/14/2020  . Class 1 obesity due to excess calories with body mass index (BMI) of 33.0 to 33.9 in adult 03/14/2020  . Pressure injury of skin 12/15/2018  . Thoracic spine fracture (HCC) 12/12/2018  . Hyponatremia 08/22/2016  . ARF (acute renal failure) (HCC) 08/21/2016  . Hypokalemia 08/11/2016  . Occult GI bleeding 08/11/2016  . General weakness 08/11/2016  . GI bleed 08/11/2016  . Weakness   . Non-ST elevation (NSTEMI) myocardial infarction (HCC) 08/08/2016  . Unstable angina (HCC) 08/06/2016  . Chest pain 08/05/2016  . CKD (chronic kidney disease), stage III (HCC) 08/05/2016  . Normochromic normocytic anemia 08/05/2016  . Pain in joint, lower leg 10/01/2014  . Coronary artery disease 04/27/2014  . Chronic atrial fibrillation (HCC) 04/27/2014  . Essential hypertension 04/27/2014  . Hyperlipidemia 04/27/2014  . Type 2 diabetes mellitus with vascular disease (HCC) 04/27/2014  . Obstructive sleep apnea 04/27/2014  . Stroke (HCC) 04/27/2014  . On continuous oral anticoagulation 04/27/2014  . Occlusion and stenosis of carotid artery without mention of cerebral infarction 02/04/2013  . Aftercare following surgery of the circulatory system, NEC 01/09/2012  . Chronic total occlusion of artery of the extremities (HCC) 01/09/2012   PCP:  [place], [name], MD Pharmacy:    [facility] [phone] - [place], [place] - [address] AT [facility] [address] [place] [address] Phone: [phone] Fax: [phone]     Social Determinants of Health (SDOH) Interventions    Readmission Risk Interventions No flowsheet data found.

## 2020-03-20 NOTE — Plan of Care (Signed)
  Problem: Education: Goal: Knowledge of General Education information will improve Description Including pain rating scale, medication(s)/side effects and non-pharmacologic comfort measures Outcome: Progressing   

## 2020-03-20 NOTE — Progress Notes (Cosign Needed)
   Durable Medical Equipment (From admission, onward)       Start     Ordered  03/20/20 1448   For home use only DME Hospital bed  Once      Question Answer Comment Length of Need 12 Months  Patient has (list medical condition): heart failure, and stage 2 sacral pressure ulcer  The above medical condition requires: Patient requires the ability to reposition frequently  Head must be elevated greater than: 30 degrees  Bed type Semi-electric  Hoyer Lift Yes  Support Surface: Gel Overlay    03/20/20 1448

## 2020-03-20 NOTE — Progress Notes (Signed)
ANTICOAGULATION CONSULT NOTE - Follow-Up  Pharmacy Consult for heparin >warfarin Indication: chest pain/ACS and atrial fibrillation  No Known Allergies  Patient Measurements: Height: 5\' 9"  (175.3 cm) Weight: 98.5 kg (217 lb 2.5 oz) IBW/kg (Calculated) : 70.7 Heparin Dosing Weight: 90.1 kg  Vital Signs: Temp: 98.3 F (36.8 C) (11/07 0407) Temp Source: Oral (11/07 0407) BP: 136/50 (11/07 0407) Pulse Rate: 71 (11/07 0407)  Labs: Recent Labs    03/18/20 0229 03/19/20 0053 03/20/20 0857  HGB 10.5* 10.5*  --   HCT 32.8* 33.4*  --   PLT 143* 143*  --   LABPROT 17.2* 18.8* 30.3*  INR 1.5* 1.6* 3.0*  HEPARINUNFRC 0.34 0.27*  --   CREATININE 2.26* 1.96* 1.54*    Estimated Creatinine Clearance: 45 mL/min (A) (by C-G formula based on SCr of 1.54 mg/dL (H)).   Medical History: Past Medical History:  Diagnosis Date  . Atrial fibrillation (HCC)    on Coumadin  . CAD (coronary artery disease)    s/p remote CABG, stent in 2019  . CHF (congestive heart failure) (HCC)   . Dementia (HCC)   . Diabetes mellitus   . DVT (deep venous thrombosis) (HCC)   . Fall at home 10/2015  . Hyperlipidemia   . Hypertension   . Left-sided carotid artery disease (HCC)   . MRSA infection   . Myocardial infarction (HCC)   . Obesity   . Peripheral vascular disease (HCC)   . Sleep apnea   . Stroke (HCC)   . Stroke (HCC)   . Venous insufficiency      Assessment: 63 you male with a history of PVD, CAD, HTN, HLD, DM and afib on warfarin presents with an NSTEMI. Pharmacy was consulted to dose heparin. PTA the patient was on warfarin at 5 mg daily with an INR goal of 2-3. It is unknown whether or not the patient was therapeutic while on this dose.  Upon presentation on 11/1, INR was 2.7 and the patient received 10 mg of vitamin K in preparation for a perc chole tube. The perc chole tube was placed 11/3. Pharmacy is consulted now consulted to dose warfarin.   INR on higher end of therapeutic up  from 1.6 to 3.0 today. CBC stable, no overt bleeding noted. Will give lower dose tonight given jump in INR.  Goal of Therapy:  INR 2-3 Monitor platelets by anticoagulation protocol: Yes   Plan:  Give warfarin 2.5 mg PO x1 dose tonight Monitor for signs and symptoms of bleeding Monitor daily PT/INR, heparin level and CBC  13/3, PharmD PGY2 Cardiology Pharmacy Resident Phone: [phone] 03/20/2020  3:43 PM  Please check [email or web address] for unit-specific pharmacy phone numbers.

## 2020-03-21 DIAGNOSIS — A419 Sepsis, unspecified organism: Secondary | ICD-10-CM | POA: Diagnosis not present

## 2020-03-21 LAB — CBC
HCT: 29.2 % — ABNORMAL LOW (ref 39.0–52.0)
Hemoglobin: 9.3 g/dL — ABNORMAL LOW (ref 13.0–17.0)
MCH: 34.7 pg — ABNORMAL HIGH (ref 26.0–34.0)
MCHC: 31.8 g/dL (ref 30.0–36.0)
MCV: 109 fL — ABNORMAL HIGH (ref 80.0–100.0)
Platelets: 210 10*3/uL (ref 150–400)
RBC: 2.68 MIL/uL — ABNORMAL LOW (ref 4.22–5.81)
RDW: 14.2 % (ref 11.5–15.5)
WBC: 10.4 10*3/uL (ref 4.0–10.5)
nRBC: 0.3 % — ABNORMAL HIGH (ref 0.0–0.2)

## 2020-03-21 LAB — GLUCOSE, CAPILLARY
Glucose-Capillary: 128 mg/dL — ABNORMAL HIGH (ref 70–99)
Glucose-Capillary: 144 mg/dL — ABNORMAL HIGH (ref 70–99)
Glucose-Capillary: 159 mg/dL — ABNORMAL HIGH (ref 70–99)
Glucose-Capillary: 170 mg/dL — ABNORMAL HIGH (ref 70–99)
Glucose-Capillary: 183 mg/dL — ABNORMAL HIGH (ref 70–99)
Glucose-Capillary: 206 mg/dL — ABNORMAL HIGH (ref 70–99)

## 2020-03-21 LAB — BASIC METABOLIC PANEL
Anion gap: 8 (ref 5–15)
BUN: 52 mg/dL — ABNORMAL HIGH (ref 8–23)
CO2: 30 mmol/L (ref 22–32)
Calcium: 8.5 mg/dL — ABNORMAL LOW (ref 8.9–10.3)
Chloride: 99 mmol/L (ref 98–111)
Creatinine, Ser: 1.6 mg/dL — ABNORMAL HIGH (ref 0.61–1.24)
GFR, Estimated: 44 mL/min — ABNORMAL LOW (ref >60)
Glucose, Bld: 164 mg/dL — ABNORMAL HIGH (ref 70–99)
Potassium: 4.1 mmol/L (ref 3.5–5.1)
Sodium: 137 mmol/L (ref 135–145)

## 2020-03-21 LAB — AEROBIC/ANAEROBIC CULTURE W GRAM STAIN (SURGICAL/DEEP WOUND)

## 2020-03-21 LAB — PROTIME-INR
INR: 3.6 — ABNORMAL HIGH (ref 0.8–1.2)
Prothrombin Time: 34.5 seconds — ABNORMAL HIGH (ref 11.4–15.2)

## 2020-03-21 LAB — OCCULT BLOOD X 1 CARD TO LAB, STOOL: Fecal Occult Bld: POSITIVE — AB

## 2020-03-21 MED ORDER — ISOSORBIDE MONONITRATE ER 30 MG PO TB24
15.0000 mg | ORAL_TABLET | Freq: Every day | ORAL | Status: DC
  Administered 2020-03-21 – 2020-03-23 (×3): 15 mg via ORAL
  Filled 2020-03-21 (×3): qty 1

## 2020-03-21 MED ORDER — INSULIN GLARGINE 100 UNIT/ML ~~LOC~~ SOLN
20.0000 [IU] | Freq: Every day | SUBCUTANEOUS | Status: DC
  Administered 2020-03-21 – 2020-03-29 (×9): 20 [IU] via SUBCUTANEOUS
  Filled 2020-03-21 (×10): qty 0.2

## 2020-03-21 MED ORDER — AMOXICILLIN-POT CLAVULANATE 875-125 MG PO TABS
1.0000 | ORAL_TABLET | Freq: Two times a day (BID) | ORAL | Status: AC
  Administered 2020-03-21 – 2020-03-28 (×16): 1 via ORAL
  Filled 2020-03-21 (×16): qty 1

## 2020-03-21 MED ORDER — FUROSEMIDE 10 MG/ML IJ SOLN
40.0000 mg | Freq: Once | INTRAMUSCULAR | Status: AC
  Administered 2020-03-21: 40 mg via INTRAVENOUS
  Filled 2020-03-21: qty 4

## 2020-03-21 MED ORDER — SODIUM CHLORIDE 0.9 % IJ SOLN: INTRAMUSCULAR | 3 refills | Status: AC

## 2020-03-21 MED ORDER — HYDRALAZINE HCL 10 MG PO TABS
10.0000 mg | ORAL_TABLET | Freq: Three times a day (TID) | ORAL | Status: DC
  Administered 2020-03-21 – 2020-03-22 (×3): 10 mg via ORAL
  Filled 2020-03-21 (×3): qty 1

## 2020-03-21 NOTE — Progress Notes (Addendum)
Inpatient Diabetes Program Recommendations  AACE/ADA: New Consensus Statement on Inpatient Glycemic Control (2015)  Target Ranges:  Prepandial:   less than 140 mg/dL      Peak postprandial:   less than 180 mg/dL (1-2 hours)      Critically ill patients:  140 - 180 mg/dL   Lab Results  Component Value Date   GLUCAP 128 (H) 03/21/2020   HGBA1C 12.1 (H) 03/14/2020    Review of Glycemic Control Results for Isaiah [name], Isaiah [name] (MRN [id number]) as of 03/21/2020 09:39  Ref. Range 03/20/2020 12:00 03/20/2020 16:53 03/20/2020 20:46 03/21/2020 00:27 03/21/2020 04:23  Glucose-Capillary Latest Ref Range: 70 - 99 mg/dL 675 (H) 916 (H) 384 (H) 170 (H) 128 (H)   Diabetes history: DM 2 Outpatient Diabetes medications: Glucotrol 5 mg bid, Novolog 70/30- 50 units bid Current orders for Inpatient glycemic control:  Novolog resistant q 4 hours  Inpatient Diabetes Program Recommendations:    Please consider changing Novolog correction to tid with meals and HS (since diet started).  Also please restart Lantus 20 units daily.    Thanks,  Beryl Meager, RN, BC-ADM Inpatient Diabetes Coordinator Pager [phone] (8a-5p)  Addendum:  Spoke with patient and daughter at bedside regarding A1C of 12.1%. Patient lives with his daughter.  She states that he has been self-administering insulin however she recognizes that she needs to assist with both monitoring and administering insulin at this time. They currently purchase the 70/30 from [facility] over the counter.  Discussed that [facility] also has insulin pens for 70/30 that are 42$ for 5 boxes.  We discussed the importance of rotating sites and also checking blood sugars before meals.  Also reviewed the signs/symptoms of low blood sugars and how to treat with 4 oz juice/regular soda.  Daughter voiced understanding.  At d/c if 70/30 to be restarted, may need to reduce dose in 1/2 due to reduced intake?  Also discussed goal blood sugars and goal A1C.

## 2020-03-21 NOTE — Progress Notes (Addendum)
ANTICOAGULATION CONSULT NOTE - Follow-Up  Pharmacy Consult for heparin >warfarin Indication: chest pain/ACS and atrial fibrillation  No Known Allergies  Patient Measurements: Height: 5\' 9"  (175.3 cm) Weight: 98.5 kg (217 lb 2.5 oz) IBW/kg (Calculated) : 70.7 Heparin Dosing Weight: 90.1 kg  Vital Signs: Temp: 97.4 F (36.3 C) (11/08 0405) Temp Source: Oral (11/08 0405) BP: 128/50 (11/08 0900) Pulse Rate: 66 (11/08 0900)  Labs: Recent Labs    03/19/20 0053 03/20/20 0857 03/21/20 0023  HGB 10.5*  --  9.3*  HCT 33.4*  --  29.2*  PLT 143*  --  210  LABPROT 18.8* 30.3* 34.5*  INR 1.6* 3.0* 3.6*  HEPARINUNFRC 0.27*  --   --   CREATININE 1.96* 1.54* 1.60*    Estimated Creatinine Clearance: 43.3 mL/min (A) (by C-G formula based on SCr of 1.6 mg/dL (H)).   Medical History: Past Medical History:  Diagnosis Date  . Atrial fibrillation (HCC)    on Coumadin  . CAD (coronary artery disease)    s/p remote CABG, stent in 2019  . CHF (congestive heart failure) (HCC)   . Dementia (HCC)   . Diabetes mellitus   . DVT (deep venous thrombosis) (HCC)   . Fall at home 10/2015  . Hyperlipidemia   . Hypertension   . Left-sided carotid artery disease (HCC)   . MRSA infection   . Myocardial infarction (HCC)   . Obesity   . Peripheral vascular disease (HCC)   . Sleep apnea   . Stroke (HCC)   . Stroke (HCC)   . Venous insufficiency      Assessment: 3 you male with a history of PVD, CAD, HTN, HLD, DM and afib on warfarin presents with an NSTEMI. Pharmacy was consulted to dose heparin. PTA the patient was on warfarin at 5 mg daily with an INR goal of 2-3. It is unknown whether or not the patient was therapeutic while on this dose.  Upon presentation on 11/1, INR was 2.7 and the patient received 10 mg of vitamin K in preparation for a perc chole tube. The perc chole tube was placed 11/3. Pharmacy is consulted now consulted to dose warfarin.   INR trending up from 1.6 (11/6) to 3  yesterday, now supratherapeutic at 3.6 today. Hgb 9.3, plt 210. Blood noted in percutaneous drain reservoir - will continue to monitor. Stools dark- sending fecal occult.    Goal of Therapy:  INR 2-3 Monitor platelets by anticoagulation protocol: Yes   Plan:  Hold warfarin tonight  Monitor for signs and symptoms of bleeding Monitor daily PT/INR and CBC  06-24-1993, PharmD, BCCCP Clinical Pharmacist  Phone: [phone] 03/21/2020 9:45 AM  Please check AMION for all [facility] Pharmacy phone numbers After 10:00 PM, call Main Pharmacy [phone]

## 2020-03-21 NOTE — Progress Notes (Signed)
PROGRESS NOTE    Isaiah [name]  IFO:[id number] DOB: April 13, 1941 DOA: 03/14/2020 PCP: [name], [name], MD   Chief Complain: Weakness, pressure ulcer  Brief Narrative:  Patient is a 79 year old male with history of peripheral vascular disease, coronary artery disease, hypertension, hyperlipidemia, diabetes type 2, A. fib on Coumadin, systolic congestive heart failure who presented with weakness and pressure ulcer on the buttock from home.  He lives with his daughter.  He was also complaining of right upper quadrant pain.  On presentation, severe sepsis was suspected.  He had AKI, fever, leukocytosis.  CT was concerning for gallbladder pathology.  Patient was started on broad start antibiotics and IV fluids.  Patient hospital course remarkable for septic shock needing transfer to ICU for pressor support.  He underwent drain placement in the right upper quadrant by IR for calculus cholecystitis.  Currently hemodynamically stable. Tentative plan for discharge to home tomorrow.  Assessment & Plan:   Principal Problem:   Sepsis due to undetermined organism [facility]) Active Problems:   Coronary artery disease   Chronic atrial fibrillation (HCC)   Essential hypertension   Hyperlipidemia   Type 2 diabetes mellitus with vascular disease (HCC)   Obstructive sleep apnea   CKD (chronic kidney disease), stage III ([place])   ARF (acute renal failure) (HCC)   Pressure injury of skin   Acute cholecystitis   Class 1 obesity due to excess calories with body mass index (BMI) of 33.0 to 33.9 in adult   Shock [facility])   Cardiac arrest ([place])   Acute systolic heart failure (HCC)   Severe sepsis with septic shock: Presented with fever, leukocytosis, AKI, encephalopathy.  Hospital course remarkable for septic shock needing pressor support and transferred to ICU service.  Continue current antibiotics.  Blood Cultures so far negative.  Currently hemodynamically stable.  Acute cholecystitis: Presented with right upper  quadrant pain.  Imaging showed distended gallbladder with layering gallstone and mild pericholestatic stranding.  Due to severity of illness, he underwent percutaneous cholecystostomy by IR with drain placement.  Continue current antibiotics.  Currently on Augmentin.  He was earlier treated with Zosyn.  Continue pain management and supportive care. Aerobic/aerobic culture showed Enterococcus faecium. On discharge to home ,he will go with the drain.  Lot of bloody output on the drain today.  I have requested IR to look into that.  Acute respiratory failure with hypoxia: Secondary to sepsis, underlying OSA.  Had to be intubated and was under as PCCM service.  Currently extubated.  He is on oxygen at home.  And he is at baseline oxygen requirement.  AKI on CKD stage IIIb: Baseline creatinine around 1.6.  Presented with severe AKI which is improving.  AKI is secondary to prerenal failure secondary to dehydration, sepsis, septic shock.  IV fluids have been stopped due to presence of severe congestive heart failure.  He is on diuretics at home which are on hold but will be resumed on discharge.  Avoid nephrotoxins.  Currently kidney function at baseline.  Acute encephalopathy: Secondary to sepsis.  Currently alert and oriented.He is HOH  Cough: Most likely secondary to congestive heart failure.  No signs of pneumonia.  Chest x-ray did not show any pneumonia..  Continue current medications  Type II NSTEMI/elevated troponin: Supply demand ischemia from sepsis/septic shock.  Cardiology were consulted with no plan for any intervention.  Severe acute on chronic systolic congestive heart failure: TTE showed reduced ejection fraction of 20% with RV dilation.  Cardiology following.  ACE/ARB on  hold due to AKI.  He is also on Lasix and metolazone at home which is on hold due to septic shock.given Lasix IV during this hospitalization.  We will resume diuretics on discharge  Pressure injury on the buttocks: Present on  admission.  Wound care consulted.  Has a grade 2 pressure ulcers on the bilateral buttocks.  Urinary retention: resolved.Continue intermittent straight cath if needed  Coronary artery disease: No complaint of chest pain.  Continue Imdur.  Follow-up with cardiology as an outpatient.  Status post CABG and PCI with stents.  Permanent A. fib: Currently rate is controlled.  On Coumadin for anticoagulation.  He was also on IV heparin for NSTEMI.  Continue to monitor INR.  Hospital course remarkable for bradycardia episodes.  Amiodarone discontinued in the setting of bradycardia.  Not on beta-blockers.  Supratherapeutic INR today.  Will hold giving Coumadin today.  Check INR tomorrow.  Normocytic anemia: Hemoglobin around baseline.  Slight drop today.  Anemia associated with his chronic medical conditions.  Check CBC tomorrow  Hypertension: Currently blood pressure stable.  Continue current medications  Hyperlipidemia: On Crestor  Diabetes type 2: Poorly controlled.  Continue current insulin regimen.  Resume home regimen on discharge.  Diabetic coordinator was consulted.  Hemoglobin A1c of 12.1  OSA: On CPAP  Obesity: BMI of 32.  Debility/deconditioning: PT/OT consulted skilled nursing facility on discharge.  Patient lives with his daughter.  Daughter Butch Penny wants to take him home with home health  Pressure Injury 03/14/20 Buttocks Right Stage 2 -  Partial thickness loss of dermis presenting as a shallow open injury with a red, pink wound bed without slough. (Active)  03/14/20 2302  Location: Buttocks  Location Orientation: Right  Staging: Stage 2 -  Partial thickness loss of dermis presenting as a shallow open injury with a red, pink wound bed without slough.  Wound Description (Comments):   Present on Admission: Yes     Pressure Injury 03/14/20 Buttocks Left Stage 2 -  Partial thickness loss of dermis presenting as a shallow open injury with a red, pink wound bed without slough. (Active)    03/14/20 2300  Location: Buttocks  Location Orientation: Left  Staging: Stage 2 -  Partial thickness loss of dermis presenting as a shallow open injury with a red, pink wound bed without slough.  Wound Description (Comments):   Present on Admission: Yes       Nutrition Problem: Increased nutrient needs Etiology: wound healing      DVT prophylaxis: Warfarin Code Status: Full code Family Communication:  Daughter at bedside Status is: Inpatient  Remains inpatient appropriate because:Inpatient level of care appropriate due to severity of illness   Dispo: The patient is from: Home              Anticipated d/c is [id number] with home health              Anticipated d/c date is:1 day              Patient currently is not medically stable to d/c.      Consultants: PCCM, IR, surgery, cardiology  Procedures: Cholecystostomy drain placement  Antimicrobials:  Anti-infectives (From admission, onward)   Start     Dose/Rate Route Frequency Ordered Stop   03/21/20 1000  amoxicillin-clavulanate (AUGMENTIN) 875-125 MG per tablet 1 tablet        1 tablet Oral Every 12 hours 03/21/20 0719 03/28/20 2359   03/18/20 2200  amoxicillin-clavulanate (AUGMENTIN) 500-125 MG per tablet 500  mg  Status:  Discontinued        1 tablet Oral Every 12 hours 03/18/20 1459 03/18/20 1502   03/18/20 2200  amoxicillin-clavulanate (AUGMENTIN) 250-62.5 MG/5ML suspension 500 mg        500 mg Oral Every 12 hours 03/18/20 1502 03/20/20 2108   03/15/20 2200  piperacillin-tazobactam (ZOSYN) IVPB 3.375 g        3.375 g 12.5 mL/hr over 240 Minutes Intravenous Every 8 hours 03/15/20 1755 03/18/20 1852   03/15/20 1000  ceFEPIme (MAXIPIME) 2 g in sodium chloride 0.9 % 100 mL IVPB  Status:  Discontinued        2 g 200 mL/hr over 30 Minutes Intravenous Every 24 hours 03/14/20 1020 03/15/20 0843   03/15/20 0930  cefTRIAXone (ROCEPHIN) 2 g in sodium chloride 0.9 % 100 mL IVPB  Status:  Discontinued        2 g 200 mL/hr  over 30 Minutes Intravenous Every 24 hours 03/15/20 0844 03/15/20 1755   03/14/20 1600  metroNIDAZOLE (FLAGYL) IVPB 500 mg  Status:  Discontinued        500 mg 100 mL/hr over 60 Minutes Intravenous Every 8 hours 03/14/20 1427 03/15/20 1755   03/14/20 0800  ceFEPIme (MAXIPIME) 2 g in sodium chloride 0.9 % 100 mL IVPB        2 g 200 mL/hr over 30 Minutes Intravenous  Once 03/14/20 0752 03/14/20 0845   03/14/20 0800  metroNIDAZOLE (FLAGYL) IVPB 500 mg        500 mg 100 mL/hr over 60 Minutes Intravenous  Once 03/14/20 0752 03/14/20 0935      Subjective:  Patient seen and examined at the bedside this morning.  Hemodynamically stable.  Looks much better today.  Cough is better.  He was complaining of some chest pain from cough.  Increased amount of bloody output noted in the IR placed right upper quadrant drain.   Objective: Vitals:   03/20/20 2206 03/21/20 0009 03/21/20 0405 03/21/20 0900  BP:  (!) 111/51 (!) 119/51 (!) 128/50  Pulse: 65 61 61 66  Resp: (!) '22 16 20   ' Temp:  98.2 F (36.8 C) (!) 97.4 F (36.3 C)   TempSrc:  Oral Oral   SpO2: 96% 92% 95%   Weight:      Height:        Intake/Output Summary (Last 24 hours) at 03/21/2020 1156 Last data filed at 03/21/2020 0600 Gross per 24 hour  Intake 750 ml  Output 2470 ml  Net -1720 ml   Filed Weights   03/14/20 0810 03/14/20 2205 03/18/20 0400  Weight: 95.3 kg 94.2 kg 98.5 kg    Examination:  General exam: Overall comfortable, deconditioned, debilitated, pleasant elderly male, obese Respiratory system: Bilateral diminished air sounds on the bases, no wheezes or crackles  Cardiovascular system: Irregularly irregular rhythm, no JVD, murmurs, rubs, gallops or clicks. Gastrointestinal system: Abdomen is somewhat distended, soft and nontender. No organomegaly or masses felt. Normal bowel sounds heard.  Right upper quadrant drain with bloody output Central nervous system: Alert and oriented. No focal neurological  deficits. Extremities: No edema, no clubbing ,no cyanosis Skin: Pressure injury on the buttocks.      Data Reviewed: I have personally reviewed following labs and imaging studies  CBC: Recent Labs  Lab 03/16/20 0309 03/16/20 0309 03/16/20 0347 03/17/20 0357 03/18/20 0229 03/19/20 0053 03/21/20 0023  WBC 14.6*  --   --  9.6 9.2 8.2 10.4  HGB 11.8*   < >  12.6* 11.1* 10.5* 10.5* 9.3*  HCT 35.8*   < > 37.0* 33.2* 32.8* 33.4* 29.2*  MCV 100.8*  --   --  100.9* 105.1* 106.0* 109.0*  PLT 179  --   --  145* 143* 143* 210   < > = values in this interval not displayed.   Basic Metabolic Panel: Recent Labs  Lab 03/15/20 0027 03/15/20 1650 03/15/20 1757 03/15/20 1802 03/16/20 0309 03/16/20 0347 03/17/20 0357 03/18/20 0229 03/19/20 0053 03/20/20 0857 03/21/20 0023  NA 131*   < >  --    < > 131*   < > 136 138 140 138 137  K 3.0*   < >  --    < > 4.1   < > 3.0* 4.0 4.1 4.4 4.1  CL 86*   < >  --    < > 90*   < > 95* 98 100 101 99  CO2 33*   < >  --    < > 25   < > '29 28 28 27 30  ' GLUCOSE 207*   < >  --    < > 309*   < > 132* 111* 118* 195* 164*  BUN 83*   < >  --    < > 92*   < > 91* 78* 66* 51* 52*  CREATININE 2.75*   < >  --    < > 2.47*   < > 2.51* 2.26* 1.96* 1.54* 1.60*  CALCIUM 9.2   < >  --    < > 8.3*   < > 8.2* 8.2* 8.5* 8.5* 8.5*  MG 2.6*  --  2.5*  --  2.3  --  2.5*  --   --   --   --   PHOS  --   --   --   --  3.7  --   --   --   --   --   --    < > = values in this interval not displayed.   GFR: Estimated Creatinine Clearance: 43.3 mL/min (A) (by C-G formula based on SCr of 1.6 mg/dL (H)). Liver Function Tests: No results for input(s): AST, ALT, ALKPHOS, BILITOT, PROT, ALBUMIN in the last 168 hours. No results for input(s): LIPASE, AMYLASE in the last 168 hours. No results for input(s): AMMONIA in the last 168 hours. Coagulation Profile: Recent Labs  Lab 03/15/20 1757 03/18/20 0229 03/19/20 0053 03/20/20 0857 03/21/20 0023  INR 1.5* 1.5* 1.6* 3.0* 3.6*    Cardiac Enzymes: No results for input(s): CKTOTAL, CKMB, CKMBINDEX, TROPONINI in the last 168 hours. BNP (last 3 results) No results for input(s): PROBNP in the last 8760 hours. HbA1C: No results for input(s): HGBA1C in the last 72 hours. CBG: Recent Labs  Lab 03/20/20 1653 03/20/20 2046 03/21/20 0027 03/21/20 0423 03/21/20 1128  GLUCAP 204* 201* 170* 128* 206*   Lipid Profile: No results for input(s): CHOL, HDL, LDLCALC, TRIG, CHOLHDL, LDLDIRECT in the last 72 hours. Thyroid Function Tests: No results for input(s): TSH, T4TOTAL, FREET4, T3FREE, THYROIDAB in the last 72 hours. Anemia Panel: No results for input(s): VITAMINB12, FOLATE, FERRITIN, TIBC, IRON, RETICCTPCT in the last 72 hours. Sepsis Labs: Recent Labs  Lab 03/14/20 2125 03/15/20 0027 03/15/20 1757 03/15/20 2231  LATICACIDVEN 1.2 0.9 2.6* 2.8*    Recent Results (from the past 240 hour(s))  Blood culture (routine x 2)     Status: None   Collection Time: 03/14/20  7:44 AM   Specimen:  BLOOD LEFT ARM  Result Value Ref Range Status   Specimen Description BLOOD LEFT ARM  Final   Special Requests   Final    BOTTLES DRAWN AEROBIC AND ANAEROBIC Blood Culture adequate volume   Culture   Final    NO GROWTH 5 DAYS Performed at [facility], [address] [place], [place] [address]    Report Status 03/19/2020 FINAL  Final  Respiratory Panel by RT PCR (Flu A&B, Covid) - Nasopharyngeal Swab     Status: None   Collection Time: 03/14/20  8:03 AM   Specimen: Nasopharyngeal Swab  Result Value Ref Range Status   SARS Coronavirus 2 by RT PCR NEGATIVE NEGATIVE Final    Comment: (NOTE) SARS-CoV-2 target nucleic acids are NOT DETECTED.  The SARS-CoV-2 RNA is generally detectable in upper respiratoy specimens during the acute phase of infection. The lowest concentration of SARS-CoV-2 viral copies this assay can detect is 131 copies/mL. A negative result does not preclude SARS-Cov-2 infection and should not be  used as the sole basis for treatment or other patient management decisions. A negative result may occur with  improper specimen collection/handling, submission of specimen other than nasopharyngeal swab, presence of viral mutation(s) within the areas targeted by this assay, and inadequate number of viral copies (<131 copies/mL). A negative result must be combined with clinical observations, patient history, and epidemiological information. The expected result is Negative.  Fact Sheet for Patients:  PinkCheek.be  Fact Sheet for Healthcare Providers:  GravelBags.it  This test is no t yet approved or cleared by the [place] FDA and  has been authorized for detection and/or diagnosis of SARS-CoV-2 by FDA under an Emergency Use Authorization (EUA). This EUA will remain  in effect (meaning this test can be used) for the duration of the COVID-19 declaration under Section 564(b)(1) of the Act, 21 U.S.C. section 360bbb-3(b)(1), unless the authorization is terminated or revoked sooner.     Influenza A by PCR NEGATIVE NEGATIVE Final   Influenza B by PCR NEGATIVE NEGATIVE Final    Comment: (NOTE) The Xpert Xpress SARS-CoV-2/FLU/RSV assay is intended as an aid in  the diagnosis of influenza from Nasopharyngeal swab specimens and  should not be used as a sole basis for treatment. Nasal washings and  aspirates are unacceptable for Xpert Xpress SARS-CoV-2/FLU/RSV  testing.  Fact Sheet for Patients: PinkCheek.be  Fact Sheet for Healthcare Providers: GravelBags.it  This test is not yet approved or cleared by the [place] FDA and  has been authorized for detection and/or diagnosis of SARS-CoV-2 by  FDA under an Emergency Use Authorization (EUA). This EUA will remain  in effect (meaning this test can be used) for the duration of the  Covid-19 declaration under Section  564(b)(1) of the Act, 21  U.S.C. section 360bbb-3(b)(1), unless the authorization is  terminated or revoked. Performed at [facility], [place] [address] [place], [place] [address]   Blood culture (routine x 2)     Status: None   Collection Time: 03/14/20  8:18 AM   Specimen: BLOOD RIGHT HAND  Result Value Ref Range Status   Specimen Description BLOOD RIGHT HAND  Final   Special Requests   Final    BOTTLES DRAWN AEROBIC ONLY Blood Culture results may not be optimal due to an inadequate volume of blood received in culture bottles   Culture   Final    NO GROWTH 5 DAYS Performed at [facility], [place] [address], [place], [place]  [address]    Report Status 03/19/2020 FINAL  Final  Urine culture     Status: None   Collection Time: 03/14/20  8:41 AM   Specimen: Urine, Random  Result Value Ref Range Status   Specimen Description URINE, RANDOM  Final   Special Requests NONE  Final   Culture   Final    NO GROWTH Performed at [facility], [place] [address], [place], [place] [address]    Report Status 03/15/2020 FINAL  Final  MRSA PCR Screening     Status: None   Collection Time: 03/14/20 10:49 PM   Specimen: Nasal Mucosa; Nasopharyngeal  Result Value Ref Range Status   MRSA by PCR NEGATIVE NEGATIVE Final    Comment:        The GeneXpert MRSA Assay (FDA approved for NASAL specimens only), is one component of a comprehensive MRSA colonization surveillance program. It is not intended to diagnose MRSA infection nor to guide or monitor treatment for MRSA infections. Performed at [facility], [place] [address], [place], [place] [address]   Aerobic/Anaerobic Culture (surgical/deep wound)     Status: None (Preliminary result)   Collection Time: 03/16/20  5:13 PM   Specimen: PATH Other; Bile  Result Value Ref Range Status   Specimen Description BILE  Final   Special Requests NONE  Final   Gram Stain   Final    FEW WBC PRESENT,BOTH PMN AND MONONUCLEAR ABUNDANT GRAM  POSITIVE COCCI Performed at [facility], [place] [address] [place], [place] [address]    Culture   Final    ABUNDANT ENTEROCOCCUS FAECIUM NO ANAEROBES ISOLATED; CULTURE IN PROGRESS FOR 5 DAYS    Report Status PENDING  Incomplete   Organism ID, Bacteria ENTEROCOCCUS FAECIUM  Final      Susceptibility   Enterococcus faecium - MIC*    AMPICILLIN <=2 SENSITIVE Sensitive     VANCOMYCIN <=0.5 SENSITIVE Sensitive     GENTAMICIN SYNERGY SENSITIVE Sensitive     * ABUNDANT ENTEROCOCCUS FAECIUM         Radiology Studies: DG CHEST PORT 1 VIEW  Result Date: 03/20/2020 CLINICAL DATA:  79 year old male with history of sudden onset of chest pain. Shortness of breath. EXAM: PORTABLE CHEST 1 VIEW COMPARISON:  Chest x-ray 03/15/2020. FINDINGS: Previously noted endotracheal and nasogastric tubes have been removed. Lung volumes are low. Bibasilar opacities may reflect areas of atelectasis and/or consolidation. Trace bilateral pleural effusions. Cephalization of the pulmonary vasculature with mild indistinctness of interstitial markings. Mild cardiomegaly. No pneumothorax. Upper mediastinal contours are within normal limits. Aortic atherosclerosis. Status post median sternotomy for CABG including LIMA. Orthopedic fixation rods throughout the lower thoracic and upper lumbar spine incompletely imaged. IMPRESSION: 1. The appearance the chest suggests mild congestive heart failure, as above. 2. Aortic atherosclerosis. 3. Postoperative changes, as above. Electronically Signed   By: Vinnie Langton M.D.   On: 03/20/2020 15:41        Scheduled Meds: . acetaminophen (TYLENOL) oral liquid 160 mg/5 mL  650 mg Oral Q4H  . acetaminophen  650 mg Rectal Q4H  . allopurinol  100 mg Oral BID  . amoxicillin-clavulanate  1 tablet Oral Q12H  . aspirin  81 mg Oral Daily  . chlorhexidine gluconate (MEDLINE KIT)  15 mL Mouth Rinse BID  . docusate  100 mg Oral BID  . furosemide  40 mg Intravenous Once  . gabapentin   300 mg Oral Q12H  . guaiFENesin-dextromethorphan  5 mL Oral Q4H  .  hydrALAZINE  10 mg Oral Q8H  . insulin aspart  0-20 Units Subcutaneous Q4H  . insulin glargine  20 Units Subcutaneous Daily  . isosorbide mononitrate  15 mg Oral Daily  . mouth rinse  15 mL Mouth Rinse BID  . pantoprazole  40 mg Oral BID  . QUEtiapine  50 mg Oral Q supper  . rosuvastatin  10 mg Oral QHS  . sodium chloride flush  3 mL Intravenous Q12H  . sodium chloride flush  5 mL Intracatheter Q8H  . Warfarin - Pharmacist Dosing Inpatient   Does not apply q1600   Continuous Infusions: . sodium chloride Stopped (03/17/20 2016)     LOS: 7 days    Time spent: 35 mins.More than 50% of that time was spent in counseling and/or coordination of care.      Shelly Coss, MD [facility] P11/12/2019, 11:56 AM

## 2020-03-21 NOTE — Progress Notes (Signed)
Referring Physician(s): Burnadette Pop, MD  Supervising Physician: Irish Lack  Patient Status:  Isaiah [name]  Chief Complaint: Follow up percutaneous cholecystostomy placed 03/16/20 by Dr. Archer Asa  Subjective:  Isaiah [name] was seen in his room today, his daughter is at bedside. He reports abdominal pain with coughing but otherwise is feeling pretty good, he is asking to be moved up in bed to help with his breathing. His daughter plans to help care for his drain when he goes home and we reviewed drain care/flushing today. She reports being concerned about the bloody drainage as she thinks it may be a little worse than it was previously.  Allergies: Patient has no known allergies.  Medications: Prior to Admission medications   Medication Sig Start Date End Date Taking? Authorizing Provider  acetaminophen (TYLENOL) 500 MG tablet Take 500-1,000 mg by mouth every 6 (six) hours as needed for mild pain or headache.   Yes [provider]  allopurinol (ZYLOPRIM) 100 MG tablet TAKE 2 TABLETS BY MOUTH DAILY. Patient taking differently: Take 100 mg by mouth 2 (two) times daily.  05/03/14  Yes Runell Gess, MD  colchicine 0.6 MG tablet Take 0.6 mg by mouth daily as needed (gout attacks).   Yes [provider]  ferrous sulfate 325 (65 FE) MG tablet Take 1 tablet (325 mg total) by mouth 2 (two) times daily with a meal. Patient taking differently: Take 325 mg by mouth daily with breakfast.  08/09/16  Yes Ghimire, Werner Lean, MD  furosemide (LASIX) 40 MG tablet Take 1 tablet (40 mg total) by mouth 2 (two) times daily. Patient taking differently: Take 40 mg by mouth in the morning.  08/24/16  Yes Osvaldo Shipper, MD  gabapentin (NEURONTIN) 300 MG capsule Take 300-600 mg by mouth See admin instructions. Take 300 mg by mouth three times a day and 600 mg at bedtime   Yes [provider]  glipiZIDE (GLUCOTROL) 5 MG tablet Take 5 mg by mouth 2 (two) times daily before a  meal.   Yes [provider]  hydrALAZINE (APRESOLINE) 100 MG tablet Take 100 mg by mouth 2 (two) times daily.   Yes [provider]  insulin aspart protamine- aspart (NOVOLOG MIX 70/30) (70-30) 100 UNIT/ML injection Inject 50 Units into the skin 2 (two) times daily before a meal.    Yes [provider]  isosorbide mononitrate (IMDUR) 60 MG 24 hr tablet Take 1 tablet (60 mg total) by mouth daily. Appointment needed for future refills Patient taking differently: Take 60 mg by mouth daily.  05/03/15  Yes Runell Gess, MD  metolazone (ZAROXOLYN) 2.5 MG tablet Take 2.5 mg by mouth daily as needed (AS DIRECTED FOR MARKED SWELLING).   Yes [provider]  Multiple Vitamin (MULTIVITAMIN WITH MINERALS) TABS tablet Take 1 tablet by mouth daily.   Yes [provider]  nitroGLYCERIN (NITROSTAT) 0.4 MG SL tablet Place 0.4 mg under the tongue every 5 (five) minutes as needed for chest pain.   Yes [provider]  pantoprazole (PROTONIX) 40 MG tablet Take 40 mg by mouth 2 (two) times daily before a meal.    Yes [provider]  potassium chloride (K-DUR) 10 MEQ tablet Take 1 tablet (10 mEq total) by mouth daily. 08/31/16  Yes Barrett, Joline Salt, PA-C  pravastatin (PRAVACHOL) 40 MG tablet Take 40 mg by mouth at bedtime.    Yes [provider]  PRESCRIPTION MEDICATION Inhale into the lungs See admin instructions. CPAP-  At bedtime and during any time of rest   Yes [provider]  Psyllium (METAMUCIL PO) Take 2 capsules by mouth at bedtime.   Yes [provider]  QUEtiapine (SEROQUEL) 50 MG tablet Take 50 mg by mouth at bedtime.    Yes [provider]  warfarin (COUMADIN) 1 MG tablet Take 1 mg by mouth daily as needed (in conjunction with the 5 mg strength, AS DIRECTED- depending on INR).  12/18/19  Yes [provider]  warfarin (COUMADIN) 5 MG tablet Take 5 mg by mouth daily with supper. 01/28/20  Yes [provider]  docusate sodium (COLACE) 100 MG capsule Take 1 capsule (100 mg total) by mouth 2 (two) times daily. Patient not taking: Reported on 03/14/2020 12/16/18   Jadene Pierini, MD  oxyCODONE (OXY IR/ROXICODONE) 5 MG immediate release tablet Take 1 tablet (5 mg total) by mouth every 4 (four) hours as needed (pain). Patient not taking: Reported on 03/14/2020 12/16/18   Jadene Pierini, MD     Vital Signs: BP (!) 128/50   Pulse 66   Temp (!) 97.4 F (36.3 C) (Oral)   Resp 20   Ht 5\' 9"  (1.753 m)   Wt 217 lb 2.5 oz (98.5 kg)   SpO2 95%   BMI 32.07 kg/m   Physical Exam Vitals and nursing note reviewed.  Constitutional:      General: He is not in acute distress. HENT:     Head: Normocephalic.  Cardiovascular:     Rate and Rhythm: Normal rate.  Pulmonary:     Effort: Pulmonary effort is normal.  Abdominal:     General: There is no distension.     Palpations: Abdomen is soft.     Tenderness: There is no abdominal tenderness.     Comments: (+) RUQ percutaneous cholecystostomy to gravity with dark blood tinged bilious output in back. Flushes easily with some blood clot in tubing. Insertion site unremarkable.  Skin:    General: Skin is warm and dry.  Neurological:     Mental Status: He is alert. Mental status is at baseline.  Psychiatric:        Behavior: Behavior normal.     Imaging: DG CHEST PORT 1 VIEW  Result Date: 03/20/2020 CLINICAL DATA:  79 year old male with history of sudden onset of chest pain. Shortness of breath. EXAM: PORTABLE CHEST 1 VIEW COMPARISON:  Chest x-ray 03/15/2020. FINDINGS: Previously noted endotracheal and nasogastric tubes have been removed. Lung volumes are low. Bibasilar opacities may reflect areas of atelectasis and/or consolidation. Trace bilateral pleural effusions. Cephalization of the pulmonary vasculature with mild indistinctness of interstitial markings. Mild cardiomegaly. No pneumothorax. Upper mediastinal contours are within  normal limits. Aortic atherosclerosis. Status post median sternotomy for CABG including LIMA. Orthopedic fixation rods throughout the lower thoracic and upper lumbar spine incompletely imaged. IMPRESSION: 1. The appearance the chest suggests mild congestive heart failure, as above. 2. Aortic atherosclerosis. 3. Postoperative changes, as above. Electronically Signed   By: 13/06/2019 M.D.   On: 03/20/2020 15:41    Labs:  CBC: Recent Labs    03/17/20 0357 03/18/20 0229 03/19/20 0053 03/21/20 0023  WBC 9.6 9.2 8.2 10.4  HGB 11.1* 10.5* 10.5* 9.3*  HCT 33.2* 32.8* 33.4* 29.2*  PLT 145* 143* 143* 210    COAGS: Recent Labs    03/14/20 0911 03/15/20 0643 03/15/20 1757 03/15/20 1757 03/18/20 0229 03/19/20 0053 03/20/20 0857 03/21/20 0023  INR 2.7*   < > 1.5*   < >  1.5* 1.6* 3.0* 3.6*  APTT 44*  --  35  --   --   --   --   --    < > = values in this interval not displayed.    BMP: Recent Labs    03/18/20 0229 03/19/20 0053 03/20/20 0857 03/21/20 0023  NA 138 140 138 137  K 4.0 4.1 4.4 4.1  CL 98 100 101 99  CO2 28 28 27 30   GLUCOSE 111* 118* 195* 164*  BUN 78* 66* 51* 52*  CALCIUM 8.2* 8.5* 8.5* 8.5*  CREATININE 2.26* 1.96* 1.54* 1.60*  GFRNONAA 29* 34* 46* 44*    LIVER FUNCTION TESTS: Recent Labs    03/14/20 0743  BILITOT 1.1  AST 24  ALT 13  ALKPHOS 82  PROT 7.8  ALBUMIN 2.8*    Assessment and Plan:  79 y/o M with acute calculous cholecystitis s/p percutaneous cholecystostomy placement 03/16/20 in IR seen today for routine follow up/concern about increased bloody drainage in gravity bag.  Per I/O most recent output 70 cc (only recorded on one shift) which is consistent without previously recorded outputs since placement. Output is consistent with dark blood/bile on exam - per procedure note at time of placement dark bile was removed and per subsequent progress notes output has been blood tinged for several days. Patient is on Coumadin with most recent  INR 3.6, his hgb has been relatively stable at 9.3, fecal occult positive as well (also noted to be positive 1 year ago). No reports of overt bleeding per patient/daughter/staff. Drain flushes easily without resistance.  For patient's who are on anticoagulation, especially with elevated INR, it is not abnormal to have blood tinged output in percutaneous drains. The output appears to be blood tinged bile, not frank blood on exam today. Would recommend continuing to follow output Qshift as well as regular CBC. If patient develops significant increase in bloody output or overt bleeding from the insertion site please call IR so that we may further evaluate the drain.  I also discussed home drain care/output monitoring with patient's daughter today - written instructions have been placed in AVS. IR scheduler will call patient with appointment date/time for cholangiogram with catheter exchange in about 6-8 weeks.  We will continue to follow while inpatient - please call with questions or concerns.  Electronically Signed: 13/3/21, PA-C 03/21/2020, 11:14 AM   I spent a total of 25 Minutes at the the patient's bedside AND on the patient's hospital floor or unit, greater than 50% of which was counseling/coordinating care for percutaneous cholecystostomy follow up.

## 2020-03-21 NOTE — Plan of Care (Signed)
  Problem: Education: Goal: Knowledge of General Education information will improve Description: Including pain rating scale, medication(s)/side effects and non-pharmacologic comfort measures Outcome: Progressing   Problem: Health Behavior/Discharge Planning: Goal: Ability to manage health-related needs will improve Outcome: Progressing   Problem: Clinical Measurements: Goal: Ability to maintain clinical measurements within normal limits will improve Outcome: Progressing Goal: Will remain free from infection Outcome: Progressing Goal: Diagnostic test results will improve Outcome: Progressing Goal: Respiratory complications will improve Outcome: Progressing Goal: Cardiovascular complication will be avoided Outcome: Progressing   Problem: Activity: Goal: Risk for activity intolerance will decrease Outcome: Progressing   Problem: Nutrition: Goal: Adequate nutrition will be maintained Outcome: Progressing   Problem: Coping: Goal: Level of anxiety will decrease Outcome: Progressing   Problem: Elimination: Goal: Will not experience complications related to bowel motility Outcome: Progressing Goal: Will not experience complications related to urinary retention Outcome: Progressing   Problem: Pain Managment: Goal: General experience of comfort will improve Outcome: Progressing   Problem: Safety: Goal: Ability to remain free from injury will improve Outcome: Progressing   Problem: Skin Integrity: Goal: Risk for impaired skin integrity will decrease Outcome: Progressing   Problem: Education: Goal: Knowledge of disease or condition will improve Outcome: Progressing Goal: Understanding of medication regimen will improve Outcome: Progressing Goal: Individualized Educational Video(s) Outcome: Progressing   Problem: Activity: Goal: Ability to tolerate increased activity will improve Outcome: Progressing   Problem: Cardiac: Goal: Ability to achieve and maintain  adequate cardiopulmonary perfusion will improve Outcome: Progressing   Problem: Health Behavior/Discharge Planning: Goal: Ability to safely manage health-related needs after discharge will improve Outcome: Progressing   Problem: Education: Goal: Knowledge of disease and its progression will improve Outcome: Progressing   Problem: Health Behavior/Discharge Planning: Goal: Ability to manage health-related needs will improve Outcome: Progressing   Problem: Clinical Measurements: Goal: Complications related to the disease process or treatment will be avoided or minimized Outcome: Progressing Goal: Dialysis access will remain free of complications Outcome: Progressing   Problem: Activity: Goal: Activity intolerance will improve Outcome: Progressing   Problem: Fluid Volume: Goal: Fluid volume balance will be maintained or improved Outcome: Progressing   Problem: Nutritional: Goal: Ability to make appropriate dietary choices will improve Outcome: Progressing   Problem: Respiratory: Goal: Respiratory symptoms related to disease process will be avoided Outcome: Progressing   Problem: Self-Concept: Goal: Body image disturbance will be avoided or minimized Outcome: Progressing   Problem: Urinary Elimination: Goal: Progression of disease will be identified and treated Outcome: Progressing   Problem: Fluid Volume: Goal: Hemodynamic stability will improve Outcome: Progressing   Problem: Clinical Measurements: Goal: Diagnostic test results will improve Outcome: Progressing Goal: Signs and symptoms of infection will decrease Outcome: Progressing   Problem: Respiratory: Goal: Ability to maintain adequate ventilation will improve Outcome: Progressing   

## 2020-03-21 NOTE — Progress Notes (Addendum)
Progress Note  Patient Name: Isaiah [name] Date of Encounter: 03/21/2020  [facility] Cardiologist: Quay Burow, MD   Subjective   Sitting up in bed. Pleuritic chest pain.   Inpatient Medications    Scheduled Meds: . acetaminophen (TYLENOL) oral liquid 160 mg/5 mL  650 mg Oral Q4H  . acetaminophen  650 mg Rectal Q4H  . allopurinol  100 mg Oral BID  . amoxicillin-clavulanate  1 tablet Oral Q12H  . aspirin  81 mg Oral Daily  . chlorhexidine gluconate (MEDLINE KIT)  15 mL Mouth Rinse BID  . docusate  100 mg Oral BID  . furosemide  40 mg Intravenous Once  . gabapentin  300 mg Oral Q12H  . guaiFENesin-dextromethorphan  5 mL Oral Q4H  . insulin aspart  0-20 Units Subcutaneous Q4H  . mouth rinse  15 mL Mouth Rinse BID  . pantoprazole  40 mg Oral BID  . QUEtiapine  50 mg Oral Q supper  . rosuvastatin  10 mg Oral QHS  . sodium chloride flush  3 mL Intravenous Q12H  . sodium chloride flush  5 mL Intracatheter Q8H  . Warfarin - Pharmacist Dosing Inpatient   Does not apply q1600   Continuous Infusions: . sodium chloride Stopped (03/17/20 2016)   PRN Meds: hydrALAZINE, loperamide, ondansetron **OR** ondansetron (ZOFRAN) IV, traMADol   Vital Signs    Vitals:   03/20/20 2206 03/21/20 0009 03/21/20 0405 03/21/20 0900  BP:  (!) 111/51 (!) 119/51 (!) 128/50  Pulse: 65 61 61 66  Resp: (!) '22 16 20   ' Temp:  98.2 F (36.8 C) (!) 97.4 F (36.3 C)   TempSrc:  Oral Oral   SpO2: 96% 92% 95%   Weight:      Height:        Intake/Output Summary (Last 24 hours) at 03/21/2020 1031 Last data filed at 03/21/2020 0600 Gross per 24 hour  Intake 750 ml  Output 2470 ml  Net -1720 ml   Last 3 Weights 03/18/2020 03/14/2020 03/14/2020  Weight (lbs) 217 lb 2.5 oz 207 lb 10.8 oz 210 lb  Weight (kg) 98.5 kg 94.2 kg 95.255 kg      Telemetry    Afib rates down into mid 20s at times - Personally Reviewed  ECG    No new tracing  Physical Exam  Pleasant, chronically ill appearing  older male GEN: No acute distress.   Neck: No JVD Cardiac: Irreg Irreg, no murmurs, rubs, or gallops.  Respiratory: Clear to auscultation bilaterally. GI: Soft, nontender, non-distended  MS: No edema; No deformity. Neuro:  Nonfocal  Psych: Normal affect   Labs    High Sensitivity Troponin:   Recent Labs  Lab 03/15/20 1705 03/15/20 1916 03/16/20 0008 03/16/20 0309  TROPONINIHS 61* 308* 982* 1,364*      Chemistry Recent Labs  Lab 03/19/20 0053 03/20/20 0857 03/21/20 0023  NA 140 138 137  K 4.1 4.4 4.1  CL 100 101 99  CO2 '28 27 30  ' GLUCOSE 118* 195* 164*  BUN 66* 51* 52*  CREATININE 1.96* 1.54* 1.60*  CALCIUM 8.5* 8.5* 8.5*  GFRNONAA 34* 46* 44*  ANIONGAP '12 10 8     ' Hematology Recent Labs  Lab 03/18/20 0229 03/19/20 0053 03/21/20 0023  WBC 9.2 8.2 10.4  RBC 3.12* 3.15* 2.68*  HGB 10.5* 10.5* 9.3*  HCT 32.8* 33.4* 29.2*  MCV 105.1* 106.0* 109.0*  MCH 33.7 33.3 34.7*  MCHC 32.0 31.4 31.8  RDW 14.3 14.2 14.2  PLT  143* 143* 210    BNPNo results for input(s): BNP, PROBNP in the last 168 hours.   DDimer No results for input(s): DDIMER in the last 168 hours.   Radiology    DG CHEST PORT 1 VIEW  Result Date: 03/20/2020 CLINICAL DATA:  79 year old male with history of sudden onset of chest pain. Shortness of breath. EXAM: PORTABLE CHEST 1 VIEW COMPARISON:  Chest x-ray 03/15/2020. FINDINGS: Previously noted endotracheal and nasogastric tubes have been removed. Lung volumes are low. Bibasilar opacities may reflect areas of atelectasis and/or consolidation. Trace bilateral pleural effusions. Cephalization of the pulmonary vasculature with mild indistinctness of interstitial markings. Mild cardiomegaly. No pneumothorax. Upper mediastinal contours are within normal limits. Aortic atherosclerosis. Status post median sternotomy for CABG including LIMA. Orthopedic fixation rods throughout the lower thoracic and upper lumbar spine incompletely imaged. IMPRESSION: 1. The  appearance the chest suggests mild congestive heart failure, as above. 2. Aortic atherosclerosis. 3. Postoperative changes, as above. Electronically Signed   By: Vinnie Langton M.D.   On: 03/20/2020 15:41    Cardiac Studies   Echo: 03/16/20  IMPRESSIONS    1. Severe biventricular dysfunction with at least mild-moderate secondary  MR and TR.  2. Left ventricular ejection fraction, by estimation, is 20%. The left  ventricle has severely decreased function. The left ventricle demonstrates  global hypokinesis. There is mild left ventricular hypertrophy. Left  ventricular diastolic parameters are  indeterminate. There is the interventricular septum is flattened in  systole and diastole, consistent with right ventricular pressure and  volume overload.  3. Right ventricular systolic function is severely reduced. The right  ventricular size is severely enlarged.  4. Left atrial size was mild to moderately dilated.  5. Right atrial size was severely dilated.  6. The mitral valve is normal in structure, and demonstrates tenting due  to LV dysfunction. Mild to moderate mitral valve regurgitation. No  evidence of mitral stenosis.  7. Tricuspid valve regurgitation is mild to moderate.  8. The aortic valve is grossly normal. There is mild calcification of the  aortic valve. Aortic valve regurgitation is trivial. No aortic stenosis is  present.   Conclusion(s)/Recommendation(s): No definite left ventricular mural or  apical thrombus/thrombi. Swirling apical contrast.   Patient Profile     79 y.o. male with acute cholecystitis and PEA arrest, noted to have severe LV dysfunction post-arrest and also elevated troponin consistent with demand ischemia of the myocardium  Assessment & Plan    1. Acute on Chronic combined HF with associated cardiogenic shock: Required pressors, intubation. Now improved, extubated on 11/4. Echo noted above with severe biventricular dysfunction, EF of 20%  with severely dilated right atrium, and moderately dilated left atrium. HF therapies have been very limited with AKI and significant bradycardia. He has been diuresed with IV lasix, UOP 2.4L yesterday. Weights are inaccurate. Breathing is stable today per his report. But noted to drop sats into the mid 80s while on O2 while being changed.  -- will give additional IV lasix 61m x1 now -- suspect he will need daily diuretic at the time of discharge  2. Elevated Troponin: known underlying CAD with hx of CABG with PCI. Peak troponin 1364. He is not a good candidate for invasive therapy with his co morbid conditions. Will continue with limited medical therapy  3. Chronic AFib: remains in Afib, rate controlled. Actually with very slow ventricular rate at timed into the upper 20s. Unable to add BB therapy given this. Has been on coumadin, INR  elevated at 3.6 today. RN also noted dark stools today, noted decline in Hgb. Also with blood in perc drainage bag.  -- coumadin held -- FOBT sent  4. CKD with AKI: Cr peaked at 2.51>> 1.6 today. Unable to add additional medical therapy for HF at this time -- follow BMET  5. Acute cholecystitis with septic shock: now s/p perc drain. Management per IR -- antibiotics per primary  6. Pleuritic chest pain: present with cough, PRN meds noted  7. DM: Hgb A1c was 12.1 on admission -- on 70/30 and glipizide PTA -- Diabetes coordinator following  8. HLD: on statin  For questions or updates, please contact [place] Please consult [email or web address] for contact info under        Signed, Reino Bellis, NP  03/21/2020, 10:31 AM      Patient seen and examined. Agree with assessment and plan.Complex history/problems as noted above. HS troponins increasing to 1362 c/w probable demand ischemia rather than ACS. Severe biventricular failure on echo with drop in O2 sats earlier, to receive furosemide 40 mg IV today. Currently breathing appears stable.  No significant  rales on lung auscultation.  Rhythm is irregular regular with atrial fibrillation rate in the 60s.  Renal function slowly improving with creatinine 1.6 today improved from 2.51.  As BP allows, may benefit from nitrate/hydralazine with LV dysfunction and CAD with history of prior CABG revascularization.  We will add low-dose isosorbide 15 mg and initiate therapy with hydralazine at 10 mg daily 8 hours and titrate as BP allows.  Hemoglobin today decreased to 9.3; INR 3.6.  Consider checking stool guaiacs.  Also with macrocytosis and MCV 109, check B12 and folate levels.  We will follow.   Troy Sine, MD, [facility] 03/21/2020 10:48 AM

## 2020-03-21 NOTE — TOC Progression Note (Addendum)
Transition of Care [facility]) - Progression Note    Patient Details  Name: Isaiah [name] MRN: [id number] Date of Birth: 26-Jun-1940  [facility] CM/SW Contact  Leone Haven, RN Phone Number: 03/21/2020, 10:17 AM  Clinical Narrative:    NCM spoke with Enrique Sack at [facility] and they are able to take referral for HHRN, HHPT, HHOT.  Will need orders for pressure wound on buttox and drain flushes. NCM notified MD.DME will be delivered to patient home today per [facility] with [employer].   Expected Discharge Plan: Skilled Nursing Facility Barriers to Discharge: Continued Medical Work up  Expected Discharge Plan and Services Expected Discharge Plan: Skilled Nursing Facility   Discharge Planning Services: CM Consult Post Acute Care Choice: Durable Medical Equipment, Home Health Living arrangements for the past 2 months: Single Family Home                 DME Arranged: Hospital bed, Other see comment DME Agency: [employer] Date DME Agency Contacted: 03/20/20 Time DME Agency Contacted: [phone] Representative spoke with at DME Agency: Shari Heritage HH Arranged: RN, PT, OT HH Agency: [facility] Date [facility] Agency Contacted: 03/20/20 Time HH Agency Contacted: [phone] Representative spoke with at [facility] Agency: Dondra Spry- request to call back on Monday 11/8   Social Determinants of Health (SDOH) Interventions    Readmission Risk Interventions No flowsheet data found.

## 2020-03-21 NOTE — Plan of Care (Signed)
  Problem: Education: Goal: Knowledge of General Education information will improve Description: Including pain rating scale, medication(s)/side effects and non-pharmacologic comfort measures Outcome: Progressing   Problem: Health Behavior/Discharge Planning: Goal: Ability to manage health-related needs will improve Outcome: Progressing   Problem: Clinical Measurements: Goal: Ability to maintain clinical measurements within normal limits will improve Outcome: Progressing Goal: Will remain free from infection Outcome: Progressing Goal: Diagnostic test results will improve Outcome: Progressing Goal: Respiratory complications will improve Outcome: Progressing Goal: Cardiovascular complication will be avoided Outcome: Progressing   Problem: Activity: Goal: Risk for activity intolerance will decrease Outcome: Progressing   Problem: Nutrition: Goal: Adequate nutrition will be maintained Outcome: Progressing   Problem: Coping: Goal: Level of anxiety will decrease Outcome: Progressing   Problem: Elimination: Goal: Will not experience complications related to bowel motility Outcome: Progressing Goal: Will not experience complications related to urinary retention Outcome: Progressing   Problem: Pain Managment: Goal: General experience of comfort will improve Outcome: Progressing   Problem: Safety: Goal: Ability to remain free from injury will improve Outcome: Progressing   Problem: Skin Integrity: Goal: Risk for impaired skin integrity will decrease Outcome: Progressing   Problem: Education: Goal: Knowledge of disease or condition will improve Outcome: Progressing Goal: Understanding of medication regimen will improve Outcome: Progressing Goal: Individualized Educational Video(s) Outcome: Progressing   Problem: Activity: Goal: Ability to tolerate increased activity will improve Outcome: Progressing   Problem: Cardiac: Goal: Ability to achieve and maintain  adequate cardiopulmonary perfusion will improve Outcome: Progressing   Problem: Health Behavior/Discharge Planning: Goal: Ability to safely manage health-related needs after discharge will improve Outcome: Progressing   Problem: Education: Goal: Knowledge of disease and its progression will improve Outcome: Progressing   Problem: Health Behavior/Discharge Planning: Goal: Ability to manage health-related needs will improve Outcome: Progressing   Problem: Clinical Measurements: Goal: Complications related to the disease process or treatment will be avoided or minimized Outcome: Progressing Goal: Dialysis access will remain free of complications Outcome: Progressing   Problem: Activity: Goal: Activity intolerance will improve Outcome: Progressing   Problem: Fluid Volume: Goal: Fluid volume balance will be maintained or improved Outcome: Progressing   Problem: Nutritional: Goal: Ability to make appropriate dietary choices will improve Outcome: Progressing   Problem: Respiratory: Goal: Respiratory symptoms related to disease process will be avoided Outcome: Progressing   Problem: Self-Concept: Goal: Body image disturbance will be avoided or minimized Outcome: Progressing   Problem: Urinary Elimination: Goal: Progression of disease will be identified and treated Outcome: Progressing   Problem: Fluid Volume: Goal: Hemodynamic stability will improve Outcome: Progressing   Problem: Clinical Measurements: Goal: Diagnostic test results will improve Outcome: Progressing Goal: Signs and symptoms of infection will decrease Outcome: Progressing   Problem: Respiratory: Goal: Ability to maintain adequate ventilation will improve Outcome: Progressing

## 2020-03-22 DIAGNOSIS — A419 Sepsis, unspecified organism: Secondary | ICD-10-CM | POA: Diagnosis not present

## 2020-03-22 LAB — BASIC METABOLIC PANEL
Anion gap: 8 (ref 5–15)
BUN: 53 mg/dL — ABNORMAL HIGH (ref 8–23)
CO2: 30 mmol/L (ref 22–32)
Calcium: 8.5 mg/dL — ABNORMAL LOW (ref 8.9–10.3)
Chloride: 96 mmol/L — ABNORMAL LOW (ref 98–111)
Creatinine, Ser: 1.38 mg/dL — ABNORMAL HIGH (ref 0.61–1.24)
GFR, Estimated: 52 mL/min — ABNORMAL LOW (ref >60)
Glucose, Bld: 173 mg/dL — ABNORMAL HIGH (ref 70–99)
Potassium: 4.7 mmol/L (ref 3.5–5.1)
Sodium: 134 mmol/L — ABNORMAL LOW (ref 135–145)

## 2020-03-22 LAB — CBC
HCT: 30 % — ABNORMAL LOW (ref 39.0–52.0)
Hemoglobin: 9.4 g/dL — ABNORMAL LOW (ref 13.0–17.0)
MCH: 33.9 pg (ref 26.0–34.0)
MCHC: 31.3 g/dL (ref 30.0–36.0)
MCV: 108.3 fL — ABNORMAL HIGH (ref 80.0–100.0)
Platelets: 269 10*3/uL (ref 150–400)
RBC: 2.77 MIL/uL — ABNORMAL LOW (ref 4.22–5.81)
RDW: 13.9 % (ref 11.5–15.5)
WBC: 11.3 10*3/uL — ABNORMAL HIGH (ref 4.0–10.5)
nRBC: 0.3 % — ABNORMAL HIGH (ref 0.0–0.2)

## 2020-03-22 LAB — RETICULOCYTES
Immature Retic Fract: 32.4 % — ABNORMAL HIGH (ref 2.3–15.9)
RBC.: 2.82 MIL/uL — ABNORMAL LOW (ref 4.22–5.81)
Retic Count, Absolute: 87.1 10*3/uL (ref 19.0–186.0)
Retic Ct Pct: 3.1 % (ref 0.4–3.1)

## 2020-03-22 LAB — VITAMIN B12: Vitamin B-12: 889 pg/mL (ref 180–914)

## 2020-03-22 LAB — GLUCOSE, CAPILLARY
Glucose-Capillary: 143 mg/dL — ABNORMAL HIGH (ref 70–99)
Glucose-Capillary: 155 mg/dL — ABNORMAL HIGH (ref 70–99)
Glucose-Capillary: 165 mg/dL — ABNORMAL HIGH (ref 70–99)
Glucose-Capillary: 179 mg/dL — ABNORMAL HIGH (ref 70–99)
Glucose-Capillary: 188 mg/dL — ABNORMAL HIGH (ref 70–99)
Glucose-Capillary: 190 mg/dL — ABNORMAL HIGH (ref 70–99)

## 2020-03-22 LAB — FERRITIN: Ferritin: 118 ng/mL (ref 24–336)

## 2020-03-22 LAB — BRAIN NATRIURETIC PEPTIDE: B Natriuretic Peptide: 441.2 pg/mL — ABNORMAL HIGH (ref 0.0–100.0)

## 2020-03-22 LAB — PROTIME-INR
INR: 2.7 — ABNORMAL HIGH (ref 0.8–1.2)
Prothrombin Time: 28.1 seconds — ABNORMAL HIGH (ref 11.4–15.2)

## 2020-03-22 LAB — IRON AND TIBC
Iron: 42 ug/dL — ABNORMAL LOW (ref 45–182)
Saturation Ratios: 17 % — ABNORMAL LOW (ref 17.9–39.5)
TIBC: 253 ug/dL (ref 250–450)
UIBC: 211 ug/dL

## 2020-03-22 LAB — FOLATE: Folate: 16.6 ng/mL (ref >5.9)

## 2020-03-22 MED ORDER — WARFARIN SODIUM 2 MG PO TABS
2.0000 mg | ORAL_TABLET | Freq: Once | ORAL | Status: AC
  Administered 2020-03-22: 2 mg via ORAL
  Filled 2020-03-22: qty 1

## 2020-03-22 MED ORDER — GUAIFENESIN-DM 100-10 MG/5ML PO SYRP: 5.0000 mL | ORAL_SOLUTION | ORAL | Status: DC | PRN

## 2020-03-22 MED ORDER — HYDRALAZINE HCL 25 MG PO TABS
25.0000 mg | ORAL_TABLET | Freq: Three times a day (TID) | ORAL | Status: DC
  Administered 2020-03-22 – 2020-03-25 (×8): 25 mg via ORAL
  Filled 2020-03-22 (×8): qty 1

## 2020-03-22 MED ORDER — SODIUM CHLORIDE 0.9 % IV SOLN
510.0000 mg | Freq: Once | INTRAVENOUS | Status: AC
  Administered 2020-03-22: 510 mg via INTRAVENOUS
  Filled 2020-03-22: qty 17

## 2020-03-22 MED ORDER — TAMSULOSIN HCL 0.4 MG PO CAPS
0.4000 mg | ORAL_CAPSULE | Freq: Every day | ORAL | Status: DC
  Administered 2020-03-22 – 2020-04-04 (×13): 0.4 mg via ORAL
  Filled 2020-03-22 (×13): qty 1

## 2020-03-22 NOTE — Plan of Care (Signed)
  Problem: Education: Goal: Knowledge of General Education information will improve Description: Including pain rating scale, medication(s)/side effects and non-pharmacologic comfort measures Outcome: Progressing   Problem: Health Behavior/Discharge Planning: Goal: Ability to manage health-related needs will improve Outcome: Progressing   Problem: Clinical Measurements: Goal: Ability to maintain clinical measurements within normal limits will improve Outcome: Progressing Goal: Will remain free from infection Outcome: Progressing Goal: Diagnostic test results will improve Outcome: Progressing Goal: Respiratory complications will improve Outcome: Progressing Goal: Cardiovascular complication will be avoided Outcome: Progressing   Problem: Activity: Goal: Risk for activity intolerance will decrease Outcome: Progressing   Problem: Nutrition: Goal: Adequate nutrition will be maintained Outcome: Progressing   Problem: Coping: Goal: Level of anxiety will decrease Outcome: Progressing   Problem: Elimination: Goal: Will not experience complications related to bowel motility Outcome: Progressing Goal: Will not experience complications related to urinary retention Outcome: Progressing   Problem: Pain Managment: Goal: General experience of comfort will improve Outcome: Progressing   Problem: Safety: Goal: Ability to remain free from injury will improve Outcome: Progressing   Problem: Skin Integrity: Goal: Risk for impaired skin integrity will decrease Outcome: Progressing   Problem: Education: Goal: Knowledge of disease or condition will improve Outcome: Progressing Goal: Understanding of medication regimen will improve Outcome: Progressing Goal: Individualized Educational Video(s) Outcome: Progressing   Problem: Activity: Goal: Ability to tolerate increased activity will improve Outcome: Progressing   Problem: Cardiac: Goal: Ability to achieve and maintain  adequate cardiopulmonary perfusion will improve Outcome: Progressing   Problem: Health Behavior/Discharge Planning: Goal: Ability to safely manage health-related needs after discharge will improve Outcome: Progressing   Problem: Education: Goal: Knowledge of disease and its progression will improve Outcome: Progressing   Problem: Health Behavior/Discharge Planning: Goal: Ability to manage health-related needs will improve Outcome: Progressing   Problem: Clinical Measurements: Goal: Complications related to the disease process or treatment will be avoided or minimized Outcome: Progressing Goal: Dialysis access will remain free of complications Outcome: Progressing   Problem: Activity: Goal: Activity intolerance will improve Outcome: Progressing   Problem: Fluid Volume: Goal: Fluid volume balance will be maintained or improved Outcome: Progressing   Problem: Nutritional: Goal: Ability to make appropriate dietary choices will improve Outcome: Progressing   Problem: Respiratory: Goal: Respiratory symptoms related to disease process will be avoided Outcome: Progressing   Problem: Self-Concept: Goal: Body image disturbance will be avoided or minimized Outcome: Progressing   Problem: Urinary Elimination: Goal: Progression of disease will be identified and treated Outcome: Progressing   Problem: Fluid Volume: Goal: Hemodynamic stability will improve Outcome: Progressing   Problem: Clinical Measurements: Goal: Diagnostic test results will improve Outcome: Progressing Goal: Signs and symptoms of infection will decrease Outcome: Progressing   Problem: Respiratory: Goal: Ability to maintain adequate ventilation will improve Outcome: Progressing   

## 2020-03-22 NOTE — Progress Notes (Signed)
PROGRESS NOTE    ALMALIK WEISSBERG  HKV:[id number] DOB: [birth year] DOA: 03/14/2020 PCP: [name], [name], MD   Chief Complain: Weakness, pressure ulcer  Brief Narrative:  Patient is a 79 year old male with history of peripheral vascular disease, coronary artery disease, hypertension, hyperlipidemia, diabetes type 2, A. fib on Coumadin, systolic congestive heart failure who presented with weakness and pressure ulcer on the buttock from home.  He lives with his daughter.  He was also complaining of right upper quadrant pain.  On presentation, severe sepsis was suspected.  He had AKI, fever, leukocytosis.  CT was concerning for cholecystitis.  Patient was started on broad spectrum antibiotics and IV fluids. Patient hospital course remarkable for septic shock needing transfer to ICU for pressor support.  He underwent drain placement at the right upper quadrant by IR for calculus cholecystitis.  Currently hemodynamically stable Hospital course remarkable for exacerbation of congestive heart failure, bradycardia .  PT/OT recommended skilled nursing facility but family wants to take him home with home health.  Waiting for clearance from cardiology for discharge to home which could be tomorrow.  Assessment & Plan:   Principal Problem:   Sepsis due to undetermined organism [facility]) Active Problems:   Coronary artery disease   Chronic atrial fibrillation (HCC)   Essential hypertension   Hyperlipidemia   Type 2 diabetes mellitus with vascular disease (HCC)   Obstructive sleep apnea   CKD (chronic kidney disease), stage III ([place])   ARF (acute renal failure) (HCC)   Pressure injury of skin   Acute cholecystitis   Class 1 obesity due to excess calories with body mass index (BMI) of 33.0 to 33.9 in adult   Shock [facility])   Cardiac arrest ([place])   Acute systolic heart failure (HCC)   Severe sepsis with septic shock: Presented with fever, leukocytosis, AKI, encephalopathy.  Hospital course remarkable for septic  shock needing pressor support and transferred to ICU service.  Blood Cultures  negative.  Currently hemodynamically stable.  Acute cholecystitis: Presented with right upper quadrant pain.  Imaging showed distended gallbladder with layering gallstone and mild pericholestatic stranding.  Due to severity of illness, he underwent percutaneous cholecystostomy by IR with drain placement.    Currently on Augmentin, plan is to continue antibiotics for total of 2 weeks course.  He was earlier treated with Zosyn.  Continue pain management and supportive care. Aerobic/aerobic culture showed Enterococcus faecium. On discharge to home ,he will go with the drain.Has  some bloody output on the drain ,IR aware about and recommended outpatient follow-up.  Acute respiratory failure with hypoxia: Secondary to sepsis, underlying OSA, congestive heart failure.  Had to be intubated and was under as PCCM service.  Currently extubated. He is on intermittent oxygen at home.  He needs oxygen on discharge.  AKI on CKD stage IIIb: Baseline creatinine around 1.6.  Presented with severe AKI which has improved.  AKI was secondary to prerenal failure secondary to dehydration, sepsis, septic shock.  IV fluids have been stopped now.  He is on diuretics at home which are on hold but will be resumed on discharge.  Avoid nephrotoxins.  Currently kidney function at baseline.  Acute encephalopathy: Secondary to sepsis.  Currently alert and oriented.He is hard of hearing  Cough: Most likely secondary to congestive heart failure.  No signs of pneumonia.  Chest x-ray did not show any pneumonia.  Cough has improved.  Type II NSTEMI/elevated troponin: Supply demand ischemia from sepsis/septic shock.  Cardiology were consulted with no plan  for any intervention.  Severe acute on chronic systolic congestive heart failure: TTE showed reduced ejection fraction of 20% with RV dilation.  Cardiology following.  ACE/ARB on hold due to AKI.  He is also  on Lasix and metolazone at home .  Given few doses  of IV Lasix here..  We will resume diuretics on discharge  Pressure injury on the buttocks: Present on admission.  Wound care consulted and were following.  Has a grade 2 pressure ulcers on the bilateral buttocks.  Urinary retention: resolved.Continue intermittent straight cath if needed.started on flomax  Coronary artery disease: No complaint of chest pain.  Continue Imdur.  Follow-up with cardiology as an outpatient.  Status post CABG and PCI with stents.  Permanent A. fib: Currently rate is controlled.  On Coumadin for anticoagulation.  He was also on IV heparin for NSTEMI.  Continue to monitor INR.  Hospital course remarkable for bradycardia episodes.  Amiodarone discontinued in the setting of bradycardia.  Not on beta-blockers.   Normocytic anemia/iron deficiency: Hemoglobin around baseline. Anemia likely associated with his chronic medical conditions.  FOBT positive but his hemoglobin is stable.  Already on Protonix.  Will not pursue further investigation.  Iron panel showed low iron, given a dose of IV iron  Hypertension: Currently blood pressure stable.  Continue current medications.  On hydralazine, Imdur  Hyperlipidemia: On Crestor  Diabetes type 2: Poorly controlled.  Continue current insulin regimen.  Resume home regimen on discharge.  Diabetic coordinator was following  Hemoglobin A1c of 12.1  OSA: On CPAP at night  Obesity: BMI of 33.  Debility/deconditioning/memory deficits: PT/OT consulted skilled nursing facility on discharge.  Patient lives with his daughter.  On Seroquel at home. Daughter Butch Penny wants to take him home with home health  Pressure Injury 03/14/20 Buttocks Right Stage 2 -  Partial thickness loss of dermis presenting as a shallow open injury with a red, pink wound bed without slough. (Active)  03/14/20 2302  Location: Buttocks  Location Orientation: Right  Staging: Stage 2 -  Partial thickness loss of dermis  presenting as a shallow open injury with a red, pink wound bed without slough.  Wound Description (Comments):   Present on Admission: Yes     Pressure Injury 03/14/20 Buttocks Left Stage 2 -  Partial thickness loss of dermis presenting as a shallow open injury with a red, pink wound bed without slough. (Active)  03/14/20 2300  Location: Buttocks  Location Orientation: Left  Staging: Stage 2 -  Partial thickness loss of dermis presenting as a shallow open injury with a red, pink wound bed without slough.  Wound Description (Comments):   Present on Admission: Yes       Nutrition Problem: Increased nutrient needs Etiology: wound healing      DVT prophylaxis: Warfarin Code Status: Full code Family Communication:  Daughter on phone today Status is: Inpatient  Remains inpatient appropriate because:Inpatient level of care appropriate due to severity of illness   Dispo: The patient is from: Home              Anticipated d/c is RK:YHCW with home health              Anticipated d/c date is:1 day              Patient currently is not medically stable to d/c.  Needs cardiology clearance before discharge.  Cardiology did not  not clear for discharge today.  Anticipate for tomorrow    Consultants: PCCM,  IR, surgery, cardiology  Procedures: Cholecystostomy drain placement  Antimicrobials:  Anti-infectives (From admission, onward)   Start     Dose/Rate Route Frequency Ordered Stop   03/21/20 1000  amoxicillin-clavulanate (AUGMENTIN) 875-125 MG per tablet 1 tablet        1 tablet Oral Every 12 hours 03/21/20 0719 03/28/20 2359   03/18/20 2200  amoxicillin-clavulanate (AUGMENTIN) 500-125 MG per tablet 500 mg  Status:  Discontinued        1 tablet Oral Every 12 hours 03/18/20 1459 03/18/20 1502   03/18/20 2200  amoxicillin-clavulanate (AUGMENTIN) 250-62.5 MG/5ML suspension 500 mg        500 mg Oral Every 12 hours 03/18/20 1502 03/20/20 2108   03/15/20 2200  piperacillin-tazobactam  (ZOSYN) IVPB 3.375 g        3.375 g 12.5 mL/hr over 240 Minutes Intravenous Every 8 hours 03/15/20 1755 03/18/20 1852   03/15/20 1000  ceFEPIme (MAXIPIME) 2 g in sodium chloride 0.9 % 100 mL IVPB  Status:  Discontinued        2 g 200 mL/hr over 30 Minutes Intravenous Every 24 hours 03/14/20 1020 03/15/20 0843   03/15/20 0930  cefTRIAXone (ROCEPHIN) 2 g in sodium chloride 0.9 % 100 mL IVPB  Status:  Discontinued        2 g 200 mL/hr over 30 Minutes Intravenous Every 24 hours 03/15/20 0844 03/15/20 1755   03/14/20 1600  metroNIDAZOLE (FLAGYL) IVPB 500 mg  Status:  Discontinued        500 mg 100 mL/hr over 60 Minutes Intravenous Every 8 hours 03/14/20 1427 03/15/20 1755   03/14/20 0800  ceFEPIme (MAXIPIME) 2 g in sodium chloride 0.9 % 100 mL IVPB        2 g 200 mL/hr over 30 Minutes Intravenous  Once 03/14/20 0752 03/14/20 0845   03/14/20 0800  metroNIDAZOLE (FLAGYL) IVPB 500 mg        500 mg 100 mL/hr over 60 Minutes Intravenous  Once 03/14/20 0752 03/14/20 0935      Subjective:  Patient seen and examined at the bedside this morning.  Hemodynamically stable during my evaluation, looked comfortable.  Denies any complaints today.   Objective: Vitals:   03/22/20 0047 03/22/20 0403 03/22/20 0741 03/22/20 1228  BP:  (!) 146/54 (!) 152/56   Pulse: 71 66 68 66  Resp: (!) 23 20 20 16  Temp:  97.8 F (36.6 C) 98.1 F (36.7 C) 97.6 F (36.4 C)  TempSrc:  Oral Oral Oral  SpO2: 96% 99% 99% 99%  Weight:      Height:        Intake/Output Summary (Last 24 hours) at 03/22/2020 1250 Last data filed at 03/22/2020 0958 Gross per 24 hour  Intake 11 ml  Output 1565 ml  Net -1554 ml   Filed Weights   03/14/20 2205 03/18/20 0400 03/21/20 1217  Weight: 94.2 kg 98.5 kg 102.2 kg    Examination:   General exam: Overall comfortable, deconditioned/debilitated, chronically ill looking, obese Respiratory system: Bilateral diminished air sounds on bases but no wheezes or crackles    Cardiovascular system: Irregularly irregular rhythm, no JVD, murmurs, rubs, gallops or clicks. Gastrointestinal system: Abdomen is nondistended, soft and nontender. No organomegaly or masses felt. Normal bowel sounds heard. Central nervous system: Alert and oriented. No focal neurological deficits. Extremities: No edema, no clubbing ,no cyanosis Skin: Pressure ulcers on the buttocks    Data Reviewed: I have personally reviewed following labs and imaging studies  CBC: Recent   Labs  Lab 03/17/20 0357 03/18/20 0229 03/19/20 0053 03/21/20 0023 03/22/20 0117  WBC 9.6 9.2 8.2 10.4 11.3*  HGB 11.1* 10.5* 10.5* 9.3* 9.4*  HCT 33.2* 32.8* 33.4* 29.2* 30.0*  MCV 100.9* 105.1* 106.0* 109.0* 108.3*  PLT 145* 143* 143* 210 027   Basic Metabolic Panel: Recent Labs  Lab 03/15/20 1757 03/15/20 1802 03/16/20 0309 03/16/20 0347 03/17/20 0357 03/17/20 0357 03/18/20 0229 03/19/20 0053 03/20/20 0857 03/21/20 0023 03/22/20 0117  NA  --    < > 131*   < > 136   < > 138 140 138 137 134*  K  --    < > 4.1   < > 3.0*   < > 4.0 4.1 4.4 4.1 4.7  CL  --    < > 90*   < > 95*   < > 98 100 101 99 96*  CO2  --    < > 25   < > 29   < > _0 GLUCOSE  --    < > 309*   < > 132*   < > 111* 118* 195* 164* 173*  BUN  --    < > 92*   < > 91*   < > 78* 66* 51* 52* 53*  CREATININE  --    < > 2.47*   < > 2.51*   < > 2.26* 1.96* 1.54* 1.60* 1.38*  CALCIUM  --    < > 8.3*   < > 8.2*   < > 8.2* 8.5* 8.5* 8.5* 8.5*  MG 2.5*  --  2.3  --  2.5*  --   --   --   --   --   --   PHOS  --   --  3.7  --   --   --   --   --   --   --   --    < > = values in this interval not displayed.   GFR: Estimated Creatinine Clearance: 51.1 mL/min (A) (by C-G formula based on SCr of 1.38 mg/dL (H)). Liver Function Tests: No results for input(s): AST, ALT, ALKPHOS, BILITOT, PROT, ALBUMIN in the last 168 hours. No results for input(s): LIPASE, AMYLASE in the last 168 hours. No results for input(s): AMMONIA in the last  168 hours. Coagulation Profile: Recent Labs  Lab 03/18/20 0229 03/19/20 0053 03/20/20 0857 03/21/20 0023 03/22/20 0117  INR 1.5* 1.6* 3.0* 3.6* 2.7*   Cardiac Enzymes: No results for input(s): CKTOTAL, CKMB, CKMBINDEX, TROPONINI in the last 168 hours. BNP (last 3 results) No results for input(s): PROBNP in the last 8760 hours. HbA1C: No results for input(s): HGBA1C in the last 72 hours. CBG: Recent Labs  Lab 03/21/20 2049 03/22/20 0034 03/22/20 0426 03/22/20 0805 03/22/20 1128  GLUCAP 183* 155* 143* 165* 190*   Lipid Profile: No results for input(s): CHOL, HDL, LDLCALC, TRIG, CHOLHDL, LDLDIRECT in the last 72 hours. Thyroid Function Tests: No results for input(s): TSH, T4TOTAL, FREET4, T3FREE, THYROIDAB in the last 72 hours. Anemia Panel: Recent Labs    03/22/20 0117  VITAMINB12 889  FOLATE 16.6  FERRITIN 118  TIBC 253  IRON 42*  RETICCTPCT 3.1   Sepsis Labs: Recent Labs  Lab 03/15/20 1757 03/15/20 2231  LATICACIDVEN 2.6* 2.8*    Recent Results (from the past 240 hour(s))  Blood culture (routine x 2)     Status: None   Collection Time: 03/14/20  7:44 AM  Specimen: BLOOD LEFT ARM  Result Value Ref Range Status   Specimen Description BLOOD LEFT ARM  Final   Special Requests   Final    BOTTLES DRAWN AEROBIC AND ANAEROBIC Blood Culture adequate volume   Culture   Final    NO GROWTH 5 DAYS Performed at [facility], [address] [place], [place] [address]    Report Status 03/19/2020 FINAL  Final  Respiratory Panel by RT PCR (Flu A&B, Covid) - Nasopharyngeal Swab     Status: None   Collection Time: 03/14/20  8:03 AM   Specimen: Nasopharyngeal Swab  Result Value Ref Range Status   SARS Coronavirus 2 by RT PCR NEGATIVE NEGATIVE Final    Comment: (NOTE) SARS-CoV-2 target nucleic acids are NOT DETECTED.  The SARS-CoV-2 RNA is generally detectable in upper respiratoy specimens during the acute phase of infection. The lowest concentration of  SARS-CoV-2 viral copies this assay can detect is 131 copies/mL. A negative result does not preclude SARS-Cov-2 infection and should not be used as the sole basis for treatment or other patient management decisions. A negative result may occur with  improper specimen collection/handling, submission of specimen other than nasopharyngeal swab, presence of viral mutation(s) within the areas targeted by this assay, and inadequate number of viral copies (<131 copies/mL). A negative result must be combined with clinical observations, patient history, and epidemiological information. The expected result is Negative.  Fact Sheet for Patients:  https://www.fda.gov/media/142436/download  Fact Sheet for Healthcare Providers:  https://www.fda.gov/media/142435/download  This test is no t yet approved or cleared by the [place] FDA and  has been authorized for detection and/or diagnosis of SARS-CoV-2 by FDA under an Emergency Use Authorization (EUA). This EUA will remain  in effect (meaning this test can be used) for the duration of the COVID-19 declaration under Section 564(b)(1) of the Act, 21 U.S.C. section 360bbb-3(b)(1), unless the authorization is terminated or revoked sooner.     Influenza A by PCR NEGATIVE NEGATIVE Final   Influenza B by PCR NEGATIVE NEGATIVE Final    Comment: (NOTE) The Xpert Xpress SARS-CoV-2/FLU/RSV assay is intended as an aid in  the diagnosis of influenza from Nasopharyngeal swab specimens and  should not be used as a sole basis for treatment. Nasal washings and  aspirates are unacceptable for Xpert Xpress SARS-CoV-2/FLU/RSV  testing.  Fact Sheet for Patients: https://www.fda.gov/media/142436/download  Fact Sheet for Healthcare Providers: https://www.fda.gov/media/142435/download  This test is not yet approved or cleared by the [place] FDA and  has been authorized for detection and/or diagnosis of SARS-CoV-2 by  FDA under an Emergency Use  Authorization (EUA). This EUA will remain  in effect (meaning this test can be used) for the duration of the  Covid-19 declaration under Section 564(b)(1) of the Act, 21  U.S.C. section 360bbb-3(b)(1), unless the authorization is  terminated or revoked. Performed at [facility], [address] [place], [place] [address]   Blood culture (routine x 2)     Status: None   Collection Time: 03/14/20  8:18 AM   Specimen: BLOOD RIGHT HAND  Result Value Ref Range Status   Specimen Description BLOOD RIGHT HAND  Final   Special Requests   Final    BOTTLES DRAWN AEROBIC ONLY Blood Culture results may not be optimal due to an inadequate volume of blood received in culture bottles   Culture   Final    NO GROWTH 5 DAYS Performed at [facility], [address] [place],   [place] [address]    Report Status 03/19/2020 FINAL  Final  Urine culture     Status: None   Collection Time: 03/14/20  8:41 AM   Specimen: Urine, Random  Result Value Ref Range Status   Specimen Description URINE, RANDOM  Final   Special Requests NONE  Final   Culture   Final    NO GROWTH Performed at [facility], [place] [address], [place], [place] [address]    Report Status 03/15/2020 FINAL  Final  MRSA PCR Screening     Status: None   Collection Time: 03/14/20 10:49 PM   Specimen: Nasal Mucosa; Nasopharyngeal  Result Value Ref Range Status   MRSA by PCR NEGATIVE NEGATIVE Final    Comment:        The GeneXpert MRSA Assay (FDA approved for NASAL specimens only), is one component of a comprehensive MRSA colonization surveillance program. It is not intended to diagnose MRSA infection nor to guide or monitor treatment for MRSA infections. Performed at [facility], [place] [address] [place], [place] [address]   Aerobic/Anaerobic Culture (surgical/deep wound)     Status: None   Collection Time: 03/16/20  5:13 PM   Specimen: PATH Other; Bile  Result Value Ref Range Status   Specimen Description  BILE  Final   Special Requests NONE  Final   Gram Stain   Final    FEW WBC PRESENT,BOTH PMN AND MONONUCLEAR ABUNDANT GRAM POSITIVE COCCI    Culture   Final    ABUNDANT ENTEROCOCCUS FAECIUM NO ANAEROBES ISOLATED Performed at [facility], [address] [place], [place] [address]    Report Status 03/21/2020 FINAL  Final   Organism ID, Bacteria ENTEROCOCCUS FAECIUM  Final      Susceptibility   Enterococcus faecium - MIC*    AMPICILLIN <=2 SENSITIVE Sensitive     VANCOMYCIN <=0.5 SENSITIVE Sensitive     GENTAMICIN SYNERGY SENSITIVE Sensitive     * ABUNDANT ENTEROCOCCUS FAECIUM         Radiology Studies: No results found.      Scheduled Meds: . acetaminophen (TYLENOL) oral liquid 160 mg/5 mL  650 mg Oral Q4H  . acetaminophen  650 mg Rectal Q4H  . allopurinol  100 mg Oral BID  . amoxicillin-clavulanate  1 tablet Oral Q12H  . aspirin  81 mg Oral Daily  . chlorhexidine gluconate (MEDLINE KIT)  15 mL Mouth Rinse BID  . docusate  100 mg Oral BID  . gabapentin  300 mg Oral Q12H  . guaiFENesin-dextromethorphan  5 mL Oral Q4H  . hydrALAZINE  25 mg Oral Q8H  . insulin aspart  0-20 Units Subcutaneous Q4H  . insulin glargine  20 Units Subcutaneous Daily  . isosorbide mononitrate  15 mg Oral Daily  . mouth rinse  15 mL Mouth Rinse BID  . pantoprazole  40 mg Oral BID  . QUEtiapine  50 mg Oral Q supper  . rosuvastatin  10 mg Oral QHS  . sodium chloride flush  3 mL Intravenous Q12H  . sodium chloride flush  5 mL Intracatheter Q8H  . tamsulosin  0.4 mg Oral QPC supper  . warfarin  2 mg Oral ONCE-1600  . Warfarin - Pharmacist Dosing Inpatient   Does not apply q1600   Continuous Infusions: . sodium chloride Stopped (03/17/20 2016)     LOS: 8 days    Time spent: 35 mins.More than 50% of that time was spent in counseling and/or coordination of care.   , MD [facility] P11/01/2020, 12:50 PM  

## 2020-03-22 NOTE — Progress Notes (Signed)
SATURATION QUALIFICATIONS: (This note is used to comply with regulatory documentation for home oxygen)  Patient Saturations on Room Air at Rest = 86%  Patient Saturation on 2 L at rest is 96%  Please briefly explain why patient needs home oxygen: Pt desaturation at rest is 86%, uses  CPAP at night, but cannot tolerate at time.Marland Kitchen  And will desat as well to 88% with 2L on.

## 2020-03-22 NOTE — Care Management Important Message (Signed)
Important Message  Patient Details  Name: Isaiah [name] MRN: [id number] Date of Birth: 04-Aug-1940   Medicare Important Message Given:  Yes     Dorena Bodo 03/22/2020, 8:39 AM

## 2020-03-22 NOTE — Progress Notes (Signed)
cpap at pt beside, he refused on using it for the night but instructed to call if he changed his mind.

## 2020-03-22 NOTE — Progress Notes (Signed)
Physical Therapy Treatment Patient Details Name: Isaiah [name] MRN: [id number] DOB: 25-Jan-1941 Today's Date: 03/22/2020    History of Present Illness Isaiah [name] is a 79 y.o. male with medical history significant of PVD; CAD; HTN; HLD; DM; afib on Coumadin; and CHF presenting with weakness and a buttock pressure ulcer. Pt with sepsis and R UQ abdominal pain, found to have cholecystitis. 11/2 Pt was undergoing anesthesia for a perc chole drain and coded. 2023-03-21 perc chole performed. Pt intubated 11/2-11/4    PT Comments    Pt supine in bed on arrival this session.  He is incontinent of stool and required assistance for pericare pre transfers.  Pt able to achieve standing with use of sara stedy this session.  He remains weak and fatigues quickly.  Plan for SNF remains appropriate at this time.    Follow Up Recommendations  SNF;Supervision/Assistance - 24 hour     Equipment Recommendations  None recommended by PT    Recommendations for Other Services       Precautions / Restrictions Precautions Precautions: Fall Precaution Comments: incontinent of bowel, perc chole drain, sacral wound Restrictions Weight Bearing Restrictions: No    Mobility  Bed Mobility Overal bed mobility: Needs Assistance Bed Mobility: Rolling;Sidelying to Sit Rolling: Max assist Sidelying to sit: Max assist       General bed mobility comments: Pt required assistance to roll R and L for pericare clean up. Pt then required max assistance to elevate trunk into sitting at edge of bed.  Once in sitting scooted to edge of bed he was able to maintain balance unsupported.  Transfers Overall transfer level: Needs assistance Equipment used: Ambulation equipment used (utilized sara stedy for OOB to standing and into recliner.) Transfers: Sit to/from Stand Sit to Stand: Mod assist;From elevated surface         General transfer comment: Mod assistance from elevated surface and Max +1 from standard chair height.   Cues for hand placement, scooting to edge of seated surface, weight shifting forward and pulling into standing on sara stedy.  Once in standing required min assistance to maintain.  He remains flexed and kyphotic.  Ambulation/Gait                 Stairs             Wheelchair Mobility    Modified Rankin (Stroke Patients Only)       Balance Overall balance assessment: Needs assistance Sitting-balance support: Feet supported;Bilateral upper extremity supported Sitting balance-Leahy Scale: Fair Sitting balance - Comments: pt able to sit in foot egress position without UE support   Standing balance support: Bilateral upper extremity supported Standing balance-Leahy Scale: Poor Standing balance comment: bil knees blocked with UE support                            Cognition Arousal/Alertness: Awake/alert Behavior During Therapy: Flat affect Overall Cognitive Status: Impaired/Different from baseline Area of Impairment: Problem solving;Safety/judgement;Following commands;Orientation                 Orientation Level: Disoriented to;Situation   Memory: Decreased short-term memory Following Commands: Follows one step commands with increased time;Follows one step commands inconsistently Safety/Judgement: Decreased awareness of safety;Decreased awareness of deficits   Problem Solving: Slow processing;Decreased initiation;Difficulty sequencing;Requires verbal cues;Requires tactile cues General Comments: pt HOH, slow to respond and lacks awareness of deficits. Incontinent of bowel on arrival.      Exercises  General Comments        Pertinent Vitals/Pain Pain Assessment: Faces Faces Pain Scale: Hurts little more Pain Location: generalized moaning with movement, at rest reports no pain, with mobility reports everywhere Pain Descriptors / Indicators: Aching;Moaning Pain Intervention(s): Monitored during session;Repositioned    Home Living                       Prior Function            PT Goals (current goals can now be found in the care plan section) Acute Rehab PT Goals Patient Stated Goal: unable to state Potential to Achieve Goals: Good Progress towards PT goals: Progressing toward goals    Frequency    Min 3X/week      PT Plan Current plan remains appropriate    Co-evaluation              AM-PAC PT "6 Clicks" Mobility   Outcome Measure  Help needed turning from your back to your side while in a flat bed without using bedrails?: Total Help needed moving from lying on your back to sitting on the side of a flat bed without using bedrails?: Total Help needed moving to and from a bed to a chair (including a wheelchair)?: Total Help needed standing up from a chair using your arms (e.g., wheelchair or bedside chair)?: Total Help needed to walk in hospital room?: Total Help needed climbing 3-5 steps with a railing? : Total 6 Click Score: 6    End of Session   Activity Tolerance: Patient tolerated treatment well Patient left: in bed;with call bell/phone within reach;with family/visitor present;with nursing/sitter in room Nurse Communication: Mobility status;Need for lift equipment PT Visit Diagnosis: Unsteadiness on feet (R26.81);Muscle weakness (generalized) (M62.81);Difficulty in walking, not elsewhere classified (R26.2)     Time: 1740-8144 PT Time Calculation (min) (ACUTE ONLY): 34 min  Charges:  $Therapeutic Activity: 23-37 mins                     Bonney Leitz , PTA Acute Rehabilitation Services Pager [phone] Office [phone]     Cathrine Krizan Artis Delay 03/22/2020, 3:42 PM

## 2020-03-22 NOTE — Progress Notes (Signed)
ANTICOAGULATION CONSULT NOTE - Follow-Up  Pharmacy Consult for heparin >warfarin Indication: chest pain/ACS and atrial fibrillation  No Known Allergies  Patient Measurements: Height: 5\' 9"  (175.3 cm) Weight: 102.2 kg (225 lb 5 oz) IBW/kg (Calculated) : 70.7 Heparin Dosing Weight: 90.1 kg  Vital Signs: Temp: 98.1 F (36.7 C) (11/09 0741) Temp Source: Oral (11/09 0741) BP: 152/56 (11/09 0741) Pulse Rate: 68 (11/09 0741)  Labs: Recent Labs    03/20/20 0857 03/21/20 0023 03/22/20 0117  HGB  --  9.3* 9.4*  HCT  --  29.2* 30.0*  PLT  --  210 269  LABPROT 30.3* 34.5* 28.1*  INR 3.0* 3.6* 2.7*  CREATININE 1.54* 1.60* 1.38*    Estimated Creatinine Clearance: 51.1 mL/min (A) (by C-G formula based on SCr of 1.38 mg/dL (H)).   Medical History: Past Medical History:  Diagnosis Date  . Atrial fibrillation (HCC)    on Coumadin  . CAD (coronary artery disease)    s/p remote CABG, stent in 2019  . CHF (congestive heart failure) (HCC)   . Dementia (HCC)   . Diabetes mellitus   . DVT (deep venous thrombosis) (HCC)   . Fall at home 10/2015  . Hyperlipidemia   . Hypertension   . Left-sided carotid artery disease (HCC)   . MRSA infection   . Myocardial infarction (HCC)   . Obesity   . Peripheral vascular disease (HCC)   . Sleep apnea   . Stroke (HCC)   . Stroke (HCC)   . Venous insufficiency      Assessment: 21 you male with a history of PVD, CAD, HTN, HLD, DM and afib on warfarin presents with an NSTEMI. Pharmacy was consulted to dose heparin. PTA the patient was on warfarin at 5 mg daily with an INR goal of 2-3. It is unknown whether or not the patient was therapeutic while on this dose.  Upon presentation on 11/1, INR was 2.7 and the patient received 10 mg of vitamin K in preparation for a perc chole tube. The perc chole tube was placed 11/3. Pharmacy is consulted now consulted to dose warfarin.   INR trending down slightly to 2.7 from 3.6 after holding dose last  night. Hgb stable at 9.4, plt 269. Found to be fecal occult positive yesterday.     Goal of Therapy:  INR 2-3 Monitor platelets by anticoagulation protocol: Yes   Plan:  Restart warfarin 2 mg tonight Monitor for signs and symptoms of bleeding Monitor daily PT/INR and CBC  13/3, PharmD, BCCCP Clinical Pharmacist  Phone: [phone] 03/22/2020 11:54 AM  Please check AMION for all [facility] Pharmacy phone numbers After 10:00 PM, call Main Pharmacy [phone]

## 2020-03-22 NOTE — Progress Notes (Signed)
Progress Note  Patient Name: Isaiah [name] Date of Encounter: 03/22/2020  [facility] Cardiologist: Quay Burow, MD   Subjective   No chest pain  Inpatient Medications    Scheduled Meds: . acetaminophen (TYLENOL) oral liquid 160 mg/5 mL  650 mg Oral Q4H  . acetaminophen  650 mg Rectal Q4H  . allopurinol  100 mg Oral BID  . amoxicillin-clavulanate  1 tablet Oral Q12H  . aspirin  81 mg Oral Daily  . chlorhexidine gluconate (MEDLINE KIT)  15 mL Mouth Rinse BID  . docusate  100 mg Oral BID  . gabapentin  300 mg Oral Q12H  . guaiFENesin-dextromethorphan  5 mL Oral Q4H  . hydrALAZINE  10 mg Oral Q8H  . insulin aspart  0-20 Units Subcutaneous Q4H  . insulin glargine  20 Units Subcutaneous Daily  . isosorbide mononitrate  15 mg Oral Daily  . mouth rinse  15 mL Mouth Rinse BID  . pantoprazole  40 mg Oral BID  . QUEtiapine  50 mg Oral Q supper  . rosuvastatin  10 mg Oral QHS  . sodium chloride flush  3 mL Intravenous Q12H  . sodium chloride flush  5 mL Intracatheter Q8H  . Warfarin - Pharmacist Dosing Inpatient   Does not apply q1600   Continuous Infusions: . sodium chloride Stopped (03/17/20 2016)  . ferumoxytol     PRN Meds: hydrALAZINE, loperamide, ondansetron **OR** ondansetron (ZOFRAN) IV, traMADol   Vital Signs    Vitals:   03/22/20 0000 03/22/20 0047 03/22/20 0403 03/22/20 0741  BP: (!) 169/67  (!) 146/54 (!) 152/56  Pulse: 73 71 66 68  Resp: (!) 23 (!) '23 20 20  ' Temp: 98.1 F (36.7 C)  97.8 F (36.6 C) 98.1 F (36.7 C)  TempSrc:   Oral Oral  SpO2: 98% 96% 99% 99%  Weight:      Height:        Intake/Output Summary (Last 24 hours) at 03/22/2020 0923 Last data filed at 03/22/2020 0535 Gross per 24 hour  Intake 8 ml  Output 1565 ml  Net -1557 ml   Last 3 Weights 03/21/2020 03/18/2020 03/14/2020  Weight (lbs) 225 lb 5 oz 217 lb 2.5 oz 207 lb 10.8 oz  Weight (kg) 102.2 kg 98.5 kg 94.2 kg      Telemetry    Atrial fibrillation in the 60s; rare  isolated PVC - Personally Reviewed  ECG    No new tracing  Physical Exam   BP (!) 152/56 (BP Location: Right Arm)   Pulse 68   Temp 98.1 F (36.7 C) (Oral)   Resp 20   Ht '5\' 9"'  (1.753 m)   Wt 102.2 kg   SpO2 99%   BMI 33.27 kg/m  General: Alert, oriented, no distress.  Skin: normal turgor, no rashes, warm and dry HEENT: Normocephalic, atraumatic. Pupils equal round and reactive to light; sclera anicteric; extraocular muscles intact;  Nose without nasal septal hypertrophy Mouth/Parynx benign; Mallinpatti scale Neck: No JVD, no carotid bruits; normal carotid upstroke Lungs: clear to ausculatation and percussion; no wheezing or rales Chest wall: without tenderness to palpitation Heart: PMI not displaced, RRR, s1 s2 normal, 1/6 systolic murmur, no diastolic murmur, no rubs, gallops, thrills, or heaves Abdomen: soft, nontender; no hepatosplenomehaly, BS+; abdominal aorta nontender and not dilated by palpation. Back: no CVA tenderness Pulses 2+ Musculoskeletal: full range of motion, normal strength, no joint deformities Extremities: no clubbing cyanosis or edema, Homan's sign negative  Neurologic: grossly nonfocal; Cranial nerves  grossly wnl Psychologic: Normal mood and affect   Labs    High Sensitivity Troponin:   Recent Labs  Lab 03/15/20 1705 03/15/20 1916 03/16/20 0008 03/16/20 0309  TROPONINIHS 61* 308* 982* 1,364*      Chemistry Recent Labs  Lab 03/20/20 0857 03/21/20 0023 03/22/20 0117  NA 138 137 134*  K 4.4 4.1 4.7  CL 101 99 96*  CO2 '27 30 30  ' GLUCOSE 195* 164* 173*  BUN 51* 52* 53*  CREATININE 1.54* 1.60* 1.38*  CALCIUM 8.5* 8.5* 8.5*  GFRNONAA 46* 44* 52*  ANIONGAP '10 8 8     ' Hematology Recent Labs  Lab 03/19/20 0053 03/19/20 0053 03/21/20 0023 03/22/20 0117  WBC 8.2  --  10.4 11.3*  RBC 3.15*   < > 2.68* 2.77*  2.82*  HGB 10.5*  --  9.3* 9.4*  HCT 33.4*  --  29.2* 30.0*  MCV 106.0*  --  109.0* 108.3*  MCH 33.3  --  34.7* 33.9    MCHC 31.4  --  31.8 31.3  RDW 14.2  --  14.2 13.9  PLT 143*  --  210 269   < > = values in this interval not displayed.    BNP Recent Labs  Lab 03/22/20 0117  BNP 441.2*    Lipid Panel     Component Value Date/Time   CHOL 79 08/07/2016 1054   TRIG 154 (H) 08/07/2016 1054   HDL 26 (L) 08/07/2016 1054   CHOLHDL 3.0 08/07/2016 1054   VLDL 31 08/07/2016 1054   LDLCALC 22 08/07/2016 1054   LDLDIRECT 43 01/28/2013 1127    DDimer No results for input(s): DDIMER in the last 168 hours.   Radiology    DG CHEST PORT 1 VIEW  Result Date: 03/20/2020 CLINICAL DATA:  79 year old male with history of sudden onset of chest pain. Shortness of breath. EXAM: PORTABLE CHEST 1 VIEW COMPARISON:  Chest x-ray 03/15/2020. FINDINGS: Previously noted endotracheal and nasogastric tubes have been removed. Lung volumes are low. Bibasilar opacities may reflect areas of atelectasis and/or consolidation. Trace bilateral pleural effusions. Cephalization of the pulmonary vasculature with mild indistinctness of interstitial markings. Mild cardiomegaly. No pneumothorax. Upper mediastinal contours are within normal limits. Aortic atherosclerosis. Status post median sternotomy for CABG including LIMA. Orthopedic fixation rods throughout the lower thoracic and upper lumbar spine incompletely imaged. IMPRESSION: 1. The appearance the chest suggests mild congestive heart failure, as above. 2. Aortic atherosclerosis. 3. Postoperative changes, as above. Electronically Signed   By: Vinnie Langton M.D.   On: 03/20/2020 15:41    Cardiac Studies   ECHO: 03/16/20 IMPRESSIONS 1. Severe biventricular dysfunction with at least mild-moderate secondary  MR and TR.  2. Left ventricular ejection fraction, by estimation, is 20%. The left  ventricle has severely decreased function. The left ventricle demonstrates  global hypokinesis. There is mild left ventricular hypertrophy. Left  ventricular diastolic parameters are   indeterminate. There is the interventricular septum is flattened in  systole and diastole, consistent with right ventricular pressure and  volume overload.  3. Right ventricular systolic function is severely reduced. The right  ventricular size is severely enlarged.  4. Left atrial size was mild to moderately dilated.  5. Right atrial size was severely dilated.  6. The mitral valve is normal in structure, and demonstrates tenting due  to LV dysfunction. Mild to moderate mitral valve regurgitation. No  evidence of mitral stenosis.  7. Tricuspid valve regurgitation is mild to moderate.  8. The aortic valve  is grossly normal. There is mild calcification of the  aortic valve. Aortic valve regurgitation is trivial. No aortic stenosis is  present.   Conclusion(s)/Recommendation(s): No definite left ventricular mural or  apical thrombus/thrombi. Swirling apical contrast.   Patient Profile     79 y.o. male with acute cholecystitis and PEA arrest, noted to have severe LV dysfunction post-arrest and also elevated troponin consistent with demand ischemia of the myocardium  Assessment & Plan    1. Acute on Chronic combined HF with associated cardiogenic shock: Required pressors, intubation. Now improved, extubated on 11/4. Echo noted above with severe biventricular dysfunction, EF of 20% with severely dilated right atrium, and moderately dilated left atrium. HF therapies have been very limited with AKI and significant bradycardia. He has been diuresed with IV lasix.  I/o SINCE ADMISSION -997.  With initial AKI, low-dose isosorbide 15 mg and hydralazine 10 mg every 8 hours was added yesterday.  BNP 441 today.  Blood pressure today is stable with systolic in the 889V.  Will titrate hydralazine to 25 mg every 8 hours.  2. Elevated Troponin: known underlying CAD with hx of CABG with PCI. Peak troponin 1364. He is not a good candidate for invasive therapy with his co morbid conditions. Will continue  with limited medical therapy  3. Chronic AFib: remains in Afib, rate controlled. Actually with very slow ventricular rate at timed into the upper 20s previously noted.  Has been on coumadin, INR elevated at 3.6 yesterday > 2.7 today.  4. CKD with AKI: Cr peaked at 2.51>> 1.6 >> 1.38 today.  5. Acute cholecystitis with septic shock: now s/p perc drain. Management per IR -- antibiotics per primary  6. Pleuritic chest pain: seems improved  7. DM: Hgb A1c was 12.1 on admission -- on 70/30 and glipizide PTA -- Diabetes coordinator following  8. HLD: on statin  9.  Macrocytosis: B12 level 889, folate level 16.6  10. Anemia: H/H 9.4/30.0  ferritin 118  11. OSA:  on CPAP  For questions or updates, please contact [place] Please consult [email or web address] for contact info under     Troy Sine, MD, [facility] 03/22/2020 9:23 AM

## 2020-03-23 DIAGNOSIS — I482 Chronic atrial fibrillation, unspecified: Secondary | ICD-10-CM | POA: Diagnosis not present

## 2020-03-23 DIAGNOSIS — N179 Acute kidney failure, unspecified: Secondary | ICD-10-CM | POA: Diagnosis not present

## 2020-03-23 DIAGNOSIS — E1159 Type 2 diabetes mellitus with other circulatory complications: Secondary | ICD-10-CM

## 2020-03-23 DIAGNOSIS — I5021 Acute systolic (congestive) heart failure: Secondary | ICD-10-CM | POA: Diagnosis not present

## 2020-03-23 DIAGNOSIS — G4733 Obstructive sleep apnea (adult) (pediatric): Secondary | ICD-10-CM

## 2020-03-23 DIAGNOSIS — A419 Sepsis, unspecified organism: Secondary | ICD-10-CM | POA: Diagnosis not present

## 2020-03-23 DIAGNOSIS — D649 Anemia, unspecified: Secondary | ICD-10-CM

## 2020-03-23 LAB — CBC WITH DIFFERENTIAL/PLATELET
Abs Immature Granulocytes: 0.19 10*3/uL — ABNORMAL HIGH (ref 0.00–0.07)
Basophils Absolute: 0 10*3/uL (ref 0.0–0.1)
Basophils Relative: 0 %
Eosinophils Absolute: 0.5 10*3/uL (ref 0.0–0.5)
Eosinophils Relative: 4 %
HCT: 25.1 % — ABNORMAL LOW (ref 39.0–52.0)
Hemoglobin: 7.8 g/dL — ABNORMAL LOW (ref 13.0–17.0)
Immature Granulocytes: 2 %
Lymphocytes Relative: 12 %
Lymphs Abs: 1.5 10*3/uL (ref 0.7–4.0)
MCH: 33.6 pg (ref 26.0–34.0)
MCHC: 31.1 g/dL (ref 30.0–36.0)
MCV: 108.2 fL — ABNORMAL HIGH (ref 80.0–100.0)
Monocytes Absolute: 0.9 10*3/uL (ref 0.1–1.0)
Monocytes Relative: 7 %
Neutro Abs: 9.3 10*3/uL — ABNORMAL HIGH (ref 1.7–7.7)
Neutrophils Relative %: 75 %
Platelets: 257 10*3/uL (ref 150–400)
RBC: 2.32 MIL/uL — ABNORMAL LOW (ref 4.22–5.81)
RDW: 14.1 % (ref 11.5–15.5)
WBC: 12.3 10*3/uL — ABNORMAL HIGH (ref 4.0–10.5)
nRBC: 0.2 % (ref 0.0–0.2)

## 2020-03-23 LAB — BASIC METABOLIC PANEL
Anion gap: 6 (ref 5–15)
BUN: 53 mg/dL — ABNORMAL HIGH (ref 8–23)
CO2: 30 mmol/L (ref 22–32)
Calcium: 8.8 mg/dL — ABNORMAL LOW (ref 8.9–10.3)
Chloride: 99 mmol/L (ref 98–111)
Creatinine, Ser: 1.25 mg/dL — ABNORMAL HIGH (ref 0.61–1.24)
GFR, Estimated: 59 mL/min — ABNORMAL LOW (ref >60)
Glucose, Bld: 147 mg/dL — ABNORMAL HIGH (ref 70–99)
Potassium: 5 mmol/L (ref 3.5–5.1)
Sodium: 135 mmol/L (ref 135–145)

## 2020-03-23 LAB — GLUCOSE, CAPILLARY
Glucose-Capillary: 134 mg/dL — ABNORMAL HIGH (ref 70–99)
Glucose-Capillary: 142 mg/dL — ABNORMAL HIGH (ref 70–99)
Glucose-Capillary: 143 mg/dL — ABNORMAL HIGH (ref 70–99)
Glucose-Capillary: 145 mg/dL — ABNORMAL HIGH (ref 70–99)
Glucose-Capillary: 146 mg/dL — ABNORMAL HIGH (ref 70–99)
Glucose-Capillary: 156 mg/dL — ABNORMAL HIGH (ref 70–99)
Glucose-Capillary: 159 mg/dL — ABNORMAL HIGH (ref 70–99)
Glucose-Capillary: 179 mg/dL — ABNORMAL HIGH (ref 70–99)

## 2020-03-23 LAB — PROTIME-INR
INR: 2.7 — ABNORMAL HIGH (ref 0.8–1.2)
Prothrombin Time: 27.8 seconds — ABNORMAL HIGH (ref 11.4–15.2)

## 2020-03-23 LAB — OCCULT BLOOD X 1 CARD TO LAB, STOOL: Fecal Occult Bld: POSITIVE — AB

## 2020-03-23 LAB — VITAMIN B12: Vitamin B-12: 888 pg/mL (ref 180–914)

## 2020-03-23 MED ORDER — ISOSORBIDE MONONITRATE ER 30 MG PO TB24
30.0000 mg | ORAL_TABLET | Freq: Every day | ORAL | Status: DC
  Administered 2020-03-24 – 2020-04-05 (×12): 30 mg via ORAL
  Filled 2020-03-23 (×12): qty 1

## 2020-03-23 MED ORDER — WARFARIN SODIUM 2 MG PO TABS
2.0000 mg | ORAL_TABLET | Freq: Once | ORAL | Status: AC
  Administered 2020-03-23: 2 mg via ORAL
  Filled 2020-03-23: qty 1

## 2020-03-23 MED ORDER — ENSURE MAX PROTEIN PO LIQD
11.0000 [oz_av] | Freq: Two times a day (BID) | ORAL | Status: DC
  Administered 2020-03-23 – 2020-03-24 (×4): 11 [oz_av] via ORAL
  Filled 2020-03-23 (×5): qty 330

## 2020-03-23 MED ORDER — FERROUS SULFATE 325 (65 FE) MG PO TABS
325.0000 mg | ORAL_TABLET | Freq: Every day | ORAL | Status: DC
  Administered 2020-03-24 – 2020-04-05 (×11): 325 mg via ORAL
  Filled 2020-03-23 (×11): qty 1

## 2020-03-23 NOTE — Progress Notes (Signed)
Progress Note  Patient Name: Isaiah [name] Date of Encounter: 03/23/2020  [facility] Cardiologist: Quay Burow, MD   Subjective   No chest pain  Inpatient Medications    Scheduled Meds: . acetaminophen (TYLENOL) oral liquid 160 mg/5 mL  650 mg Oral Q4H  . acetaminophen  650 mg Rectal Q4H  . allopurinol  100 mg Oral BID  . amoxicillin-clavulanate  1 tablet Oral Q12H  . aspirin  81 mg Oral Daily  . chlorhexidine gluconate (MEDLINE KIT)  15 mL Mouth Rinse BID  . docusate  100 mg Oral BID  . gabapentin  300 mg Oral Q12H  . hydrALAZINE  25 mg Oral Q8H  . insulin aspart  0-20 Units Subcutaneous Q4H  . insulin glargine  20 Units Subcutaneous Daily  . isosorbide mononitrate  15 mg Oral Daily  . mouth rinse  15 mL Mouth Rinse BID  . pantoprazole  40 mg Oral BID  . QUEtiapine  50 mg Oral Q supper  . rosuvastatin  10 mg Oral QHS  . sodium chloride flush  3 mL Intravenous Q12H  . sodium chloride flush  5 mL Intracatheter Q8H  . tamsulosin  0.4 mg Oral QPC supper  . Warfarin - Pharmacist Dosing Inpatient   Does not apply q1600   Continuous Infusions: . sodium chloride Stopped (03/17/20 2016)   PRN Meds: guaiFENesin-dextromethorphan, hydrALAZINE, loperamide, ondansetron **OR** ondansetron (ZOFRAN) IV, traMADol   Vital Signs    Vitals:   03/23/20 0617 03/23/20 0746 03/23/20 0800 03/23/20 0822  BP: (!) 109/39 (!) 113/36 (!) 107/47   Pulse:  76 74 75  Resp:  17 (!) 22 14  Temp:  98.5 F (36.9 C)    TempSrc:  Oral    SpO2:  99%  97%  Weight:      Height:        Intake/Output Summary (Last 24 hours) at 03/23/2020 1022 Last data filed at 03/23/2020 0932 Gross per 24 hour  Intake 381 ml  Output 1200 ml  Net -819 ml   I/O since admission: -1810   Last 3 Weights 03/21/2020 03/18/2020 03/14/2020  Weight (lbs) 225 lb 5 oz 217 lb 2.5 oz 207 lb 10.8 oz  Weight (kg) 102.2 kg 98.5 kg 94.2 kg      Telemetry    Atrial fibrillation in the 60s; rare isolated PVC -  Personally Reviewed  ECG    No new tracing  Physical Exam     Physical Exam BP (!) 107/47   Pulse 75   Temp 98.5 F (36.9 C) (Oral)   Resp 14   Ht '5\' 9"'  (1.753 m)   Wt 102.2 kg   SpO2 97%   BMI 33.27 kg/m  General: Alert, oriented, no distress.  Skin: normal turgor, no rashes, warm and dry HEENT: Normocephalic, atraumatic. Pupils equal round and reactive to light; sclera anicteric; extraocular muscles intact;  Nose without nasal septal hypertrophy Mouth/Parynx benign; Mallinpatti scale 3 Neck: No JVD, no carotid bruits; normal carotid upstroke Lungs: clear to ausculatation and percussion; no wheezing or rales Chest wall: without tenderness to palpitation Heart: PMI not displaced, RRR, s1 s2 normal, 1/6 systolic murmur, no diastolic murmur, no rubs, gallops, thrills, or heaves Abdomen: soft, nontender; no hepatosplenomehaly, BS+; abdominal aorta nontender and not dilated by palpation. Back: no CVA tenderness Pulses 2+ Musculoskeletal: full range of motion, normal strength, no joint deformities Extremities: no clubbing cyanosis or edema, Homan's sign negative  Neurologic: grossly nonfocal; Cranial nerves grossly wnl Psychologic:  Normal mood and affect    Labs    High Sensitivity Troponin:   Recent Labs  Lab 03/15/20 1705 03/15/20 1916 03/16/20 0008 03/16/20 0309  TROPONINIHS 61* 308* 982* 1,364*      Chemistry Recent Labs  Lab 03/21/20 0023 03/22/20 0117 03/23/20 0022  NA 137 134* 135  K 4.1 4.7 5.0  CL 99 96* 99  CO2 '30 30 30  ' GLUCOSE 164* 173* 147*  BUN 52* 53* 53*  CREATININE 1.60* 1.38* 1.25*  CALCIUM 8.5* 8.5* 8.8*  GFRNONAA 44* 52* 59*  ANIONGAP '8 8 6     ' Hematology Recent Labs  Lab 03/21/20 0023 03/22/20 0117 03/23/20 0022  WBC 10.4 11.3* 12.3*  RBC 2.68* 2.77*  2.82* 2.32*  HGB 9.3* 9.4* 7.8*  HCT 29.2* 30.0* 25.1*  MCV 109.0* 108.3* 108.2*  MCH 34.7* 33.9 33.6  MCHC 31.8 31.3 31.1  RDW 14.2 13.9 14.1  PLT 210 269 257     BNP Recent Labs  Lab 03/22/20 0117  BNP 441.2*    Lipid Panel     Component Value Date/Time   CHOL 79 08/07/2016 1054   TRIG 154 (H) 08/07/2016 1054   HDL 26 (L) 08/07/2016 1054   CHOLHDL 3.0 08/07/2016 1054   VLDL 31 08/07/2016 1054   LDLCALC 22 08/07/2016 1054   LDLDIRECT 43 01/28/2013 1127    DDimer No results for input(s): DDIMER in the last 168 hours.   Radiology    No results found.  Cardiac Studies   ECHO: 03/16/20 IMPRESSIONS 1. Severe biventricular dysfunction with at least mild-moderate secondary  MR and TR.  2. Left ventricular ejection fraction, by estimation, is 20%. The left  ventricle has severely decreased function. The left ventricle demonstrates  global hypokinesis. There is mild left ventricular hypertrophy. Left  ventricular diastolic parameters are  indeterminate. There is the interventricular septum is flattened in  systole and diastole, consistent with right ventricular pressure and  volume overload.  3. Right ventricular systolic function is severely reduced. The right  ventricular size is severely enlarged.  4. Left atrial size was mild to moderately dilated.  5. Right atrial size was severely dilated.  6. The mitral valve is normal in structure, and demonstrates tenting due  to LV dysfunction. Mild to moderate mitral valve regurgitation. No  evidence of mitral stenosis.  7. Tricuspid valve regurgitation is mild to moderate.  8. The aortic valve is grossly normal. There is mild calcification of the  aortic valve. Aortic valve regurgitation is trivial. No aortic stenosis is  present.   Conclusion(s)/Recommendation(s): No definite left ventricular mural or  apical thrombus/thrombi. Swirling apical contrast.   Patient Profile     79 y.o. male with acute cholecystitis and PEA arrest, noted to have severe LV dysfunction post-arrest and also elevated troponin consistent with demand ischemia of the myocardium  Assessment & Plan     1. Acute on Chronic combined HF with associated cardiogenic shock: Required pressors, intubation. Now improved, extubated on 11/4. Echo noted above with severe biventricular dysfunction, EF of 20% with severely dilated right atrium, and moderately dilated left atrium. HF therapies have been very limited with AKI and significant bradycardia. He has been diuresed with IV lasix.  I/o SINCE ADMISSION -997.  With initial AKI, low-dose isosorbide 15 mg and hydralazine 10 mg every 8 hours was added 11/8 and hydralazine was titrated to 25 mg every 8 hours yesterday.  BNP 441.  Blood pressure now 136/84; will slightly titrate isosorbide to 30 mg  daily  2. Elevated Troponin: known underlying CAD with hx of CABG with PCI. Peak troponin 1364. He is not a good candidate for invasive therapy with his co morbid conditions.  Continue with medical therapy.  3. Chronic AFib: remains in Afib, rate controlled currently in the low sixties. He has been on coumadin, currently on hold, INR elevated at 3.6  > 2.7 > 2.7 today again.  4. CKD with AKI: Cr peaked at 2.51>> 1.6 >> 1.38 >> 1.25 today.  5.  Anemia: H/H 9.4/30 yesterday, decreased to 7.8/25.1 today.  Prior ferritin level normal at 118.  Check stool guaiacs.  6. Acute cholecystitis with septic shock: now s/p perc drain. Management per IR -- antibiotics per primary  7.  Hyperlipidemia: Currently on low-dose rosuvastatin at 10 mg.  Lipids not recently checked, will recheck in a.m. along with LFTs  8. Pleuritic chest pain: Resolved  9. DM: Hgb A1c was 12.1 on admission -- on 70/30 and glipizide PTA -- Diabetes coordinator following  10. Macrocytosis: B12 level 889, folate level 16.6  11. OSA:  on CPAP  For questions or updates, please contact [place] Please consult [email or web address] for contact info under     Troy Sine, MD, [facility] 03/23/2020 10:22 AM

## 2020-03-23 NOTE — Plan of Care (Signed)
  Problem: Education: Goal: Knowledge of General Education information will improve Description: Including pain rating scale, medication(s)/side effects and non-pharmacologic comfort measures Outcome: Progressing   Problem: Health Behavior/Discharge Planning: Goal: Ability to manage health-related needs will improve Outcome: Progressing   Problem: Clinical Measurements: Goal: Ability to maintain clinical measurements within normal limits will improve Outcome: Progressing Goal: Will remain free from infection Outcome: Progressing Goal: Diagnostic test results will improve Outcome: Progressing Goal: Respiratory complications will improve Outcome: Progressing Goal: Cardiovascular complication will be avoided Outcome: Progressing   Problem: Activity: Goal: Risk for activity intolerance will decrease Outcome: Progressing   Problem: Nutrition: Goal: Adequate nutrition will be maintained Outcome: Progressing   Problem: Coping: Goal: Level of anxiety will decrease Outcome: Progressing   Problem: Elimination: Goal: Will not experience complications related to bowel motility Outcome: Progressing Goal: Will not experience complications related to urinary retention Outcome: Progressing   Problem: Pain Managment: Goal: General experience of comfort will improve Outcome: Progressing   Problem: Safety: Goal: Ability to remain free from injury will improve Outcome: Progressing   Problem: Skin Integrity: Goal: Risk for impaired skin integrity will decrease Outcome: Progressing   Problem: Education: Goal: Knowledge of disease or condition will improve Outcome: Progressing Goal: Understanding of medication regimen will improve Outcome: Progressing Goal: Individualized Educational Video(s) Outcome: Progressing   Problem: Activity: Goal: Ability to tolerate increased activity will improve Outcome: Progressing   Problem: Cardiac: Goal: Ability to achieve and maintain  adequate cardiopulmonary perfusion will improve Outcome: Progressing   Problem: Health Behavior/Discharge Planning: Goal: Ability to safely manage health-related needs after discharge will improve Outcome: Progressing   Problem: Education: Goal: Knowledge of disease and its progression will improve Outcome: Progressing   Problem: Health Behavior/Discharge Planning: Goal: Ability to manage health-related needs will improve Outcome: Progressing   Problem: Clinical Measurements: Goal: Complications related to the disease process or treatment will be avoided or minimized Outcome: Progressing Goal: Dialysis access will remain free of complications Outcome: Progressing   Problem: Activity: Goal: Activity intolerance will improve Outcome: Progressing   Problem: Fluid Volume: Goal: Fluid volume balance will be maintained or improved Outcome: Progressing   Problem: Nutritional: Goal: Ability to make appropriate dietary choices will improve Outcome: Progressing   Problem: Respiratory: Goal: Respiratory symptoms related to disease process will be avoided Outcome: Progressing   Problem: Self-Concept: Goal: Body image disturbance will be avoided or minimized Outcome: Progressing   Problem: Urinary Elimination: Goal: Progression of disease will be identified and treated Outcome: Progressing   Problem: Fluid Volume: Goal: Hemodynamic stability will improve Outcome: Progressing   Problem: Clinical Measurements: Goal: Diagnostic test results will improve Outcome: Progressing Goal: Signs and symptoms of infection will decrease Outcome: Progressing   Problem: Respiratory: Goal: Ability to maintain adequate ventilation will improve Outcome: Progressing   

## 2020-03-23 NOTE — Progress Notes (Signed)
ANTICOAGULATION CONSULT NOTE - Follow-Up  Pharmacy Consult for heparin >warfarin Indication: chest pain/ACS and atrial fibrillation  No Known Allergies  Patient Measurements: Height: 5\' 9"  (175.3 cm) Weight: 102.2 kg (225 lb 5 oz) IBW/kg (Calculated) : 70.7 Heparin Dosing Weight: 90.1 kg  Vital Signs: Temp: 97.8 F (36.6 C) (11/10 1104) Temp Source: Oral (11/10 1104) BP: 122/46 (11/10 1104) Pulse Rate: 73 (11/10 1104)  Labs: Recent Labs    03/21/20 0023 03/21/20 0023 03/22/20 0117 03/23/20 0022  HGB 9.3*   < > 9.4* 7.8*  HCT 29.2*  --  30.0* 25.1*  PLT 210  --  269 257  LABPROT 34.5*  --  28.1* 27.8*  INR 3.6*  --  2.7* 2.7*  CREATININE 1.60*  --  1.38* 1.25*   < > = values in this interval not displayed.    Estimated Creatinine Clearance: 56.5 mL/min (A) (by C-G formula based on SCr of 1.25 mg/dL (H)).   Medical History: Past Medical History:  Diagnosis Date  . Atrial fibrillation (HCC)    on Coumadin  . CAD (coronary artery disease)    s/p remote CABG, stent in 2019  . CHF (congestive heart failure) (HCC)   . Dementia (HCC)   . Diabetes mellitus   . DVT (deep venous thrombosis) (HCC)   . Fall at home 10/2015  . Hyperlipidemia   . Hypertension   . Left-sided carotid artery disease (HCC)   . MRSA infection   . Myocardial infarction (HCC)   . Obesity   . Peripheral vascular disease (HCC)   . Sleep apnea   . Stroke (HCC)   . Stroke (HCC)   . Venous insufficiency      Assessment: 50 you male with a history of PVD, CAD, HTN, HLD, DM and afib on warfarin presents with an NSTEMI. Pharmacy was consulted to dose heparin. PTA the patient was on warfarin at 5 mg daily with an INR goal of 2-3. It is unknown whether or not the patient was therapeutic while on this dose.  Upon presentation on 11/1, INR was 2.7 and the patient received 10 mg of vitamin K in preparation for a perc chole tube. The perc chole tube was placed 11/3. Pharmacy is consulted now consulted  to dose warfarin.   INR remains stable at 2.7. Hgb now down to 7.8, plt 257. Found to be fecal occult positive yesterday.     Goal of Therapy:  INR 2-3 Monitor platelets by anticoagulation protocol: Yes   Plan:  Order warfarin 2 mg tonight Monitor for signs and symptoms of bleeding Monitor daily PT/INR and CBC  13/3, PharmD, BCCCP Clinical Pharmacist  Phone: [phone] 03/23/2020 11:15 AM  Please check AMION for all [facility] Pharmacy phone numbers After 10:00 PM, call Main Pharmacy [phone]

## 2020-03-23 NOTE — Progress Notes (Signed)
Nutrition Follow-up  RD working remotely.  DOCUMENTATION CODES:   Not applicable  INTERVENTION:   - Ensure Max po BID, each supplement provides 150 kcal and 30 grams of protein  - Strawberry yogurt TID with meal trays, RD to order via [employer] diet software  - Encourage adequate PO intake  NUTRITION DIAGNOSIS:   Increased nutrient needs related to wound healing as evidenced by estimated needs.  Ongoing  GOAL:   Patient will meet greater than or equal to 90% of their needs  Progressing  MONITOR:   PO intake, Supplement acceptance, Labs, Weight trends, I & O's, Skin  REASON FOR ASSESSMENT:   Ventilator    ASSESSMENT:   79 year old male who presented to the ED on 11/10 with generalized weakness. PMH of CAD, CHF, atrial fibrillation, DVT, HTN, HLD, PVD, CVA, thoracic back surgery last year, pressure injury. Pt admitted with sepsis due to acute cholecystitis.  11/02 - brief cardiac arrest in IR, intubated 11/03 - s/p percutaneous cholecystostomy tube placement 11/04 - extubated, diet advanced  Unable to reach pt via phone call to room. Weight trending up since admission from 207 lbs on 11/01 to 225 lbs on 11/08. No new weights since 11/08.  Only 1 meal completion documented which was 95% at lunch yesterday. Noted Ensure Enlive supplements were d/c. RD to order Ensure Max to aid pt in meeting kcal and elevated protein needs and promote wound healing.  Meal Completion: 95% x 1 documented meal  Medications reviewed and include: colace, SSI q 4 hours, lantus 20 units daily, protonix, warfarin  Labs reviewed: BUN 53, creatinine 1.25, hemoglobin 7.8 CBGs: 134-190 x 24 hours  UOP: 1150 ml x 24 hours Perc chole drain: 50 ml x 24 hours I/O's: -1.8 L since admit  Diet Order:   Diet Order            Diet heart healthy/carb modified Room service appropriate? Yes; Fluid consistency: Thin  Diet effective now                 EDUCATION NEEDS:   Education needs  have been addressed  Skin:  Skin Assessment: Skin Integrity Issues: Stage II: bilateral buttocks  Last BM:  03/22/20  Height:   Ht Readings from Last 1 Encounters:  03/21/20 5\' 9"  (1.753 m)    Weight:   Wt Readings from Last 1 Encounters:  03/21/20 102.2 kg    Ideal Body Weight:  72.7 kg  BMI:  Body mass index is 33.27 kg/m.  Estimated Nutritional Needs:   Kcal:  2100-2300  Protein:  110-130 grams  Fluid:  >/= 2.0 L    13/08/21, MS, RD, LDN Inpatient Clinical Dietitian Please see AMiON for contact information.

## 2020-03-23 NOTE — TOC Progression Note (Signed)
Transition of Care [facility]) - Progression Note    Patient Details  Name: Isaiah [name] MRN: [id number] Date of Birth: [birth year]  [facility] CM/SW Contact  Leone Haven, RN Phone Number: 03/23/2020, 12:26 PM  Clinical Narrative:    NCM informed by MD that patient will need home oxygen when he is dc tomorrow, NCM spoke with daughter, Lupita Leash on the phone in the room, she states they have all the other DME at the house and NCM informed her that he will need home oxygen , she states she is ok with [employer] supplying the oxygen.  NCM made referral to [facility] with [employer] for the home oxygen.  Plan is to dc patient tomorrow by ptar if he remains stable.  Address confirmed.  Donna's cell phone is [phone].   Expected Discharge Plan: Skilled Nursing Facility Barriers to Discharge: Continued Medical Work up  Expected Discharge Plan and Services Expected Discharge Plan: Skilled Nursing Facility   Discharge Planning Services: CM Consult Post Acute Care Choice: Durable Medical Equipment, Home Health Living arrangements for the past 2 months: Single Family Home                 DME Arranged: Hospital bed, Other see comment DME Agency: [employer] Date DME Agency Contacted: 03/20/20 Time DME Agency Contacted: [phone] Representative spoke with at DME Agency: Shari Heritage HH Arranged: RN, PT, OT HH Agency: [facility] Date [facility] Agency Contacted: 03/20/20 Time HH Agency Contacted: [phone] Representative spoke with at [facility] Agency: Dondra Spry- request to call back on Monday 11/8   Social Determinants of Health (SDOH) Interventions    Readmission Risk Interventions No flowsheet data found.

## 2020-03-23 NOTE — Progress Notes (Signed)
PROGRESS NOTE    Isaiah [name]  ELF:[id number] DOB: 27-Jul-1940 DOA: 03/14/2020 PCP: [name], [name], MD    Brief Narrative:  Isaiah [name] is a 79 year old male with past medical history notable for peripheral vascular disease, CAD, essential hypertension, hyperlipidemia, type 2 diabetes mellitus, atrial fibrillation on Coumadin, chronic systolic congestive heart failure who presented to the ED with weakness and a pressure ulcer on the buttock.  Patient also complaining of right upper quadrant pain.  In the ED, patient noted to be febrile with a leukocytosis.  Elevated creatinine.  CT abdomen/pelvis concerning for acute cholecystitis.  Patient was started on broad-spectrum antibiotics and IV fluids.  General surgery was consulted.  Hospital service consulted for admission for severe sepsis secondary to acute cholecystitis.   Assessment & Plan:   Principal Problem:   Sepsis due to undetermined organism [facility]) Active Problems:   Coronary artery disease   Chronic atrial fibrillation (HCC)   Essential hypertension   Hyperlipidemia   Type 2 diabetes mellitus with vascular disease (HCC)   Obstructive sleep apnea   CKD (chronic kidney disease), stage III ([place])   ARF (acute renal failure) (HCC)   Pressure injury of skin   Acute cholecystitis   Class 1 obesity due to excess calories with body mass index (BMI) of 33.0 to 33.9 in adult   Shock [facility])   Cardiac arrest ([place])   Acute systolic heart failure (HCC)   Severe sepsis with septic shock Patient presenting to the ED with fever, leukocytosis, renal failure and encephalopathy.  Shortly following admission, patient became hypotensive despite aggressive IV fluid resuscitation and was transferred to the ICU service for pressor support.  Patient was able to be weaned from vasopressors; and was transreferred back to the hospitalist service on 03/19/2020.  Acute cholecystitis Patient presenting to ED with right upper quadrant pain.  Imaging  notable for distended gallbladder with layering gallstone and mild pericholestatic stranding.  Given severity of illness requiring ICU level of care and pressure support, patient underwent percutaneous cholecystostomy by IR on 03/16/2020. --WBC --IV zosyn transitioned to Augmentin; plan to complete 14 day course (End: 11/15) --Continue cholecystostomy to gravity, flush 77m TID; otput 56mpast 24hours  Acute on chronic combined systolic/diastolic congestive heart failure; decompensated with associated cardiogenic shock Patient presenting with septic shock as above with associated cholecystitis with likely underlying cardiogenic shock requiring vasopressors and ventilatory support, extubated on 03/17/2020 and weaned off vasopressors.  TTE with severe biventricular dysfunction with LVEF 20% with severely dilated RA, moderately dilated LA. --Cardiology following, appreciate assistance --net negative 8195mast 24hrs and net negative 1.8L since admission --continue isosorbide mononitrate 30 mg p.o. daily --Hydralazine 25 mg q8h --avoiding ACEi/ARB 2/2 AKI, and BB 2/2 bradycardia  Acute renal failure on CKD stage IIIb: Presented with creatinine 3.46 with baseline roughly 1.6. etiology likely secondary to prerenal failure from dehydration, sepsis, and shock as above.  Now appears back to baseline. --Cr 3.45>>>1.25 --Avoid nephrotoxins, renally dose all medications --Closely monitor renal function daily.  Chronic permanent atrial fibrillation Currently rate controlled.  On Coumadin for anticoagulation at home.  Was also on IV heparin for NSTEMI during hospitalization.  Hospital course remarkable for bradycardia cardiac episodes and amiodarone was discontinued.  Not on beta-blockers. --INR 2.7 --continue coumadin; pharmacy consulted for dosing/monitoring  Acute metabolic encephalopathy: Secondary to sepsis.  Currently alert and oriented.He is hard of hearing  Cough:  Most likely secondary to  congestive heart failure.  No signs of pneumonia on chest xray.  Cough has improved.  Type II NSTEMI/elevated troponin:  Supply demand ischemia from sepsis/septic shock.  Cardiology were consulted with no plan for any intervention.  Severe acute on chronic systolic congestive heart failure:  TTE showed reduced ejection fraction of 20% with RV dilation.  Cardiology following. ACE/ARB on hold due to AKI.  He is also on Lasix and metolazone at home .  Given few doses  of IV Lasix here.  We will resume diuretics on discharge  Urinary retention: resolved. Continue intermittent straight cath if needed.started on flomax  Coronary artery disease:  No complaint of chest pain.  Continue Imdur.  Follow-up with cardiology as an outpatient. Status post CABG and PCI with stents.  Continue aspirin 81 mg p.o. daily  Macrocytic Anemia/iron deficiency:  Anemia likely associated with his chronic medical conditions. Iron 42, TIBC 253, Ferritin 118, foalte 16.6.  --Hgb 13.9>>>10.5>9.3>9.4>7.8 --check B12 level --FOBT ordered --s/p IV Feraheme 03/22/2020 --continue home ferrous sulfate 325 PO daily --Protonix 40 mg BIB  Hypertension:  Currently blood pressure stable.  Continue hydralazine, Imdur, aspirin and statin  Hyperlipidemia: On Crestor  Diabetes type 2:  Poorly controlled, hemoglobin A1c 12.1.  Diabetic coordinator was following. --Lantus 20 units obviously daily --Resistant NovoLog insulin sliding scale for coverage --CBGs qAC/HS  OSA: On CPAP at night  Obesity: BMI of 33.  Counseled on need for weight loss/lifestyle changes this complicates all facets of care.  Debility/deconditioning/memory deficits:  PT/OT consulted skilled nursing facility on discharge.  Patient lives with his daughter.  On Seroquel at home. Daughter Butch Penny wants to take him home with home health.  Stage II pressure injury, buttocks; POA Pressure Injury 03/14/20 Buttocks Right Stage 2 -  Partial thickness  loss of dermis presenting as a shallow open injury with a red, pink wound bed without slough. (Active)  03/14/20 2302  Location: Buttocks  Location Orientation: Right  Staging: Stage 2 -  Partial thickness loss of dermis presenting as a shallow open injury with a red, pink wound bed without slough.  Wound Description (Comments):   Present on Admission: Yes     Pressure Injury 03/14/20 Buttocks Left Stage 2 -  Partial thickness loss of dermis presenting as a shallow open injury with a red, pink wound bed without slough. (Active)  03/14/20 2300  Location: Buttocks  Location Orientation: Left  Staging: Stage 2 -  Partial thickness loss of dermis presenting as a shallow open injury with a red, pink wound bed without slough.  Wound Description (Comments):   Present on Admission: Yes  Wound care consulted and were following.   Continue local wound care, offloading.   DVT prophylaxis: Coumadin Code Status: Full code Family Communication: None present at bedside, updated patient's daughter Butch Penny via telephone this morning  Disposition Plan:  Status is: Inpatient  Remains inpatient appropriate because:Unsafe d/c plan, IV treatments appropriate due to intensity of illness or inability to take PO and Inpatient level of care appropriate due to severity of illness   Dispo: The patient is from: Home              Anticipated d/c is to: Home              Anticipated d/c date is: 1 day              Patient currently is not medically stable to d/c.   Consultants:   PCCM  Interventional radiology  General surgery  Cardiology  Procedures:   IR placement cholecystostomy on 03/17/2020  Antimicrobials:   Zosyn 11/2 - 11/5  Augmentin 11/5>> (end 11/15)  Metronidazole 11/1 - 11/2  Cefepime 11/2 -11/2  Ceftriaxone 11/2 - 11/2   Subjective: Patient seen and examined at bedside, resting comfortably.  No complaints this morning.  Wishes to return home with daughter despite therapy  recommendations of SNF placement.  Noted hemoglobin dropped from 9.4-7.8 this morning.  Received IV iron yesterday.  No other complaints or concerns at this time.  Denies headache, no dizziness, no chest pain, no palpitations, no shortness of breath, no abdominal pain.  No acute events overnight per nursing staff.  Objective: Vitals:   03/23/20 0746 03/23/20 0800 03/23/20 0822 03/23/20 1104  BP: (!) 113/36 (!) 107/47  (!) 122/46  Pulse: 76 74 75 73  Resp: 17 (!) 22 14 (!) 21  Temp: 98.5 F (36.9 C)   97.8 F (36.6 C)  TempSrc: Oral   Oral  SpO2: 99%  97% 98%  Weight:      Height:        Intake/Output Summary (Last 24 hours) at 03/23/2020 1129 Last data filed at 03/23/2020 1030 Gross per 24 hour  Intake 381 ml  Output 1550 ml  Net -1169 ml   Filed Weights   03/14/20 2205 03/18/20 0400 03/21/20 1217  Weight: 94.2 kg 98.5 kg 102.2 kg    Examination:  General exam: Appears calm and comfortable  Respiratory system: Clear to auscultation. Respiratory effort normal.  Oxygenating well on room air Cardiovascular system: S1 & S2 heard, RRR. No JVD, murmurs, rubs, gallops or clicks. No pedal edema. Gastrointestinal system: Abdomen is nondistended, soft, mild tenderness noted surrounding cholecystostomy site with dressing clean/dry/intact; but do note bloody drainage in cholecystostomy bag. No organomegaly or masses felt. Normal bowel sounds heard. Central nervous system: Alert and oriented. No focal neurological deficits Extremities: Symmetric 5 x 5 power. Skin: No rashes, lesions or ulcers Psychiatry: Judgement and insight appear poor. Mood & affect appropriate.     Data Reviewed: I have personally reviewed following labs and imaging studies  CBC: Recent Labs  Lab 03/18/20 0229 03/19/20 0053 03/21/20 0023 03/22/20 0117 03/23/20 0022  WBC 9.2 8.2 10.4 11.3* 12.3*  NEUTROABS  --   --   --   --  9.3*  HGB 10.5* 10.5* 9.3* 9.4* 7.8*  HCT 32.8* 33.4* 29.2* 30.0* 25.1*  MCV  105.1* 106.0* 109.0* 108.3* 108.2*  PLT 143* 143* 210 269 037   Basic Metabolic Panel: Recent Labs  Lab 03/17/20 0357 03/18/20 0229 03/19/20 0053 03/20/20 0857 03/21/20 0023 03/22/20 0117 03/23/20 0022  NA 136   < > 140 138 137 134* 135  K 3.0*   < > 4.1 4.4 4.1 4.7 5.0  CL 95*   < > 100 101 99 96* 99  CO2 29   < > _0 GLUCOSE 132*   < > 118* 195* 164* 173* 147*  BUN 91*   < > 66* 51* 52* 53* 53*  CREATININE 2.51*   < > 1.96* 1.54* 1.60* 1.38* 1.25*  CALCIUM 8.2*   < > 8.5* 8.5* 8.5* 8.5* 8.8*  MG 2.5*  --   --   --   --   --   --    < > = values in this interval not displayed.   GFR: Estimated Creatinine Clearance: 56.5 mL/min (A) (by C-G formula based on SCr of 1.25 mg/dL (H)). Liver Function Tests: No results for input(s): AST, ALT, ALKPHOS, BILITOT, PROT, ALBUMIN  in the last 168 hours. No results for input(s): LIPASE, AMYLASE in the last 168 hours. No results for input(s): AMMONIA in the last 168 hours. Coagulation Profile: Recent Labs  Lab 03/19/20 0053 03/20/20 0857 03/21/20 0023 03/22/20 0117 03/23/20 0022  INR 1.6* 3.0* 3.6* 2.7* 2.7*   Cardiac Enzymes: No results for input(s): CKTOTAL, CKMB, CKMBINDEX, TROPONINI in the last 168 hours. BNP (last 3 results) No results for input(s): PROBNP in the last 8760 hours. HbA1C: No results for input(s): HGBA1C in the last 72 hours. CBG: Recent Labs  Lab 03/22/20 1644 03/22/20 2002 03/23/20 0339 03/23/20 0749 03/23/20 0806  GLUCAP 179* 188* 134* 143* 146*   Lipid Profile: No results for input(s): CHOL, HDL, LDLCALC, TRIG, CHOLHDL, LDLDIRECT in the last 72 hours. Thyroid Function Tests: No results for input(s): TSH, T4TOTAL, FREET4, T3FREE, THYROIDAB in the last 72 hours. Anemia Panel: Recent Labs    03/22/20 0117  VITAMINB12 889  FOLATE 16.6  FERRITIN 118  TIBC 253  IRON 42*  RETICCTPCT 3.1   Sepsis Labs: No results for input(s): PROCALCITON, LATICACIDVEN in the last 168 hours.  Recent  Results (from the past 240 hour(s))  Blood culture (routine x 2)     Status: None   Collection Time: 03/14/20  7:44 AM   Specimen: BLOOD LEFT ARM  Result Value Ref Range Status   Specimen Description BLOOD LEFT ARM  Final   Special Requests   Final    BOTTLES DRAWN AEROBIC AND ANAEROBIC Blood Culture adequate volume   Culture   Final    NO GROWTH 5 DAYS Performed at [facility], [address] [place], [place] [address]    Report Status 03/19/2020 FINAL  Final  Respiratory Panel by RT PCR (Flu A&B, Covid) - Nasopharyngeal Swab     Status: None   Collection Time: 03/14/20  8:03 AM   Specimen: Nasopharyngeal Swab  Result Value Ref Range Status   SARS Coronavirus 2 by RT PCR NEGATIVE NEGATIVE Final    Comment: (NOTE) SARS-CoV-2 target nucleic acids are NOT DETECTED.  The SARS-CoV-2 RNA is generally detectable in upper respiratoy specimens during the acute phase of infection. The lowest concentration of SARS-CoV-2 viral copies this assay can detect is 131 copies/mL. A negative result does not preclude SARS-Cov-2 infection and should not be used as the sole basis for treatment or other patient management decisions. A negative result may occur with  improper specimen collection/handling, submission of specimen other than nasopharyngeal swab, presence of viral mutation(s) within the areas targeted by this assay, and inadequate number of viral copies (<131 copies/mL). A negative result must be combined with clinical observations, patient history, and epidemiological information. The expected result is Negative.  Fact Sheet for Patients:  PinkCheek.be  Fact Sheet for Healthcare Providers:  GravelBags.it  This test is no t yet approved or cleared by the [place] FDA and  has been authorized for detection and/or diagnosis of SARS-CoV-2 by FDA under an Emergency Use Authorization (EUA). This EUA will remain  in  effect (meaning this test can be used) for the duration of the COVID-19 declaration under Section 564(b)(1) of the Act, 21 U.S.C. section 360bbb-3(b)(1), unless the authorization is terminated or revoked sooner.     Influenza A by PCR NEGATIVE NEGATIVE Final   Influenza B by PCR NEGATIVE NEGATIVE Final    Comment: (NOTE) The Xpert Xpress SARS-CoV-2/FLU/RSV assay is intended as an aid in  the diagnosis of influenza from Nasopharyngeal swab specimens and  should not be used as a sole basis for treatment. Nasal washings and  aspirates are unacceptable for Xpert Xpress SARS-CoV-2/FLU/RSV  testing.  Fact Sheet for Patients: PinkCheek.be  Fact Sheet for Healthcare Providers: GravelBags.it  This test is not yet approved or cleared by the [place] FDA and  has been authorized for detection and/or diagnosis of SARS-CoV-2 by  FDA under an Emergency Use Authorization (EUA). This EUA will remain  in effect (meaning this test can be used) for the duration of the  Covid-19 declaration under Section 564(b)(1) of the Act, 21  U.S.C. section 360bbb-3(b)(1), unless the authorization is  terminated or revoked. Performed at [facility], [place] [address] [place], [place] [address]   Blood culture (routine x 2)     Status: None   Collection Time: 03/14/20  8:18 AM   Specimen: BLOOD RIGHT HAND  Result Value Ref Range Status   Specimen Description BLOOD RIGHT HAND  Final   Special Requests   Final    BOTTLES DRAWN AEROBIC ONLY Blood Culture results may not be optimal due to an inadequate volume of blood received in culture bottles   Culture   Final    NO GROWTH 5 DAYS Performed at [facility], [place] [address], [place], [place] [address]    Report Status 03/19/2020 FINAL  Final  Urine culture     Status: None   Collection Time: 03/14/20  8:41 AM   Specimen: Urine, Random  Result Value Ref Range Status   Specimen  Description URINE, RANDOM  Final   Special Requests NONE  Final   Culture   Final    NO GROWTH Performed at [facility], [place] [address] [place], [place] [address]    Report Status 03/15/2020 FINAL  Final  MRSA PCR Screening     Status: None   Collection Time: 03/14/20 10:49 PM   Specimen: Nasal Mucosa; Nasopharyngeal  Result Value Ref Range Status   MRSA by PCR NEGATIVE NEGATIVE Final    Comment:        The GeneXpert MRSA Assay (FDA approved for NASAL specimens only), is one component of a comprehensive MRSA colonization surveillance program. It is not intended to diagnose MRSA infection nor to guide or monitor treatment for MRSA infections. Performed at [facility], [place] [address], [place], [place] [address]   Aerobic/Anaerobic Culture (surgical/deep wound)     Status: None   Collection Time: 03/16/20  5:13 PM   Specimen: PATH Other; Bile  Result Value Ref Range Status   Specimen Description BILE  Final   Special Requests NONE  Final   Gram Stain   Final    FEW WBC PRESENT,BOTH PMN AND MONONUCLEAR ABUNDANT GRAM POSITIVE COCCI    Culture   Final    ABUNDANT ENTEROCOCCUS FAECIUM NO ANAEROBES ISOLATED Performed at [facility], [address], [place], [place] [address]    Report Status 03/21/2020 FINAL  Final   Organism ID, Bacteria ENTEROCOCCUS FAECIUM  Final      Susceptibility   Enterococcus faecium - MIC*    AMPICILLIN <=2 SENSITIVE Sensitive     VANCOMYCIN <=0.5 SENSITIVE Sensitive     GENTAMICIN SYNERGY SENSITIVE Sensitive     * ABUNDANT ENTEROCOCCUS FAECIUM         Radiology Studies: No results found.      Scheduled Meds: . acetaminophen (TYLENOL) oral liquid 160 mg/5 mL  650 mg Oral Q4H  . acetaminophen  650 mg Rectal Q4H  . allopurinol  100 mg Oral BID  . amoxicillin-clavulanate  1 tablet Oral Q12H  . aspirin  81 mg Oral Daily  . chlorhexidine gluconate (MEDLINE KIT)  15 mL Mouth Rinse BID  . docusate  100 mg Oral BID  .  gabapentin  300 mg Oral Q12H  . hydrALAZINE  25 mg Oral Q8H  . insulin aspart  0-20 Units Subcutaneous Q4H  . insulin glargine  20 Units Subcutaneous Daily  . [START ON 03/24/2020] isosorbide mononitrate  30 mg Oral Daily  . mouth rinse  15 mL Mouth Rinse BID  . pantoprazole  40 mg Oral BID  . Ensure Max Protein  11 oz Oral BID  . QUEtiapine  50 mg Oral Q supper  . rosuvastatin  10 mg Oral QHS  . sodium chloride flush  3 mL Intravenous Q12H  . sodium chloride flush  5 mL Intracatheter Q8H  . tamsulosin  0.4 mg Oral QPC supper  . warfarin  2 mg Oral ONCE-1600  . Warfarin - Pharmacist Dosing Inpatient   Does not apply q1600   Continuous Infusions: . sodium chloride Stopped (03/17/20 2016)     LOS: 9 days    Time spent: 38 minutes spent on chart review, discussion with nursing staff, consultants, updating family and interview/physical exam; more than 50% of that time was spent in counseling and/or coordination of care.    Aaidyn San J [place] DO [facility] Available via Epic secure chat 7am-7pm After these hours, please refer to coverage provider listed on [email or web address] 03/23/2020, 11:29 AM

## 2020-03-24 DIAGNOSIS — N1831 Chronic kidney disease, stage 3a: Secondary | ICD-10-CM

## 2020-03-24 DIAGNOSIS — I5043 Acute on chronic combined systolic (congestive) and diastolic (congestive) heart failure: Secondary | ICD-10-CM

## 2020-03-24 DIAGNOSIS — I482 Chronic atrial fibrillation, unspecified: Secondary | ICD-10-CM | POA: Diagnosis not present

## 2020-03-24 DIAGNOSIS — K810 Acute cholecystitis: Secondary | ICD-10-CM | POA: Diagnosis not present

## 2020-03-24 DIAGNOSIS — A419 Sepsis, unspecified organism: Secondary | ICD-10-CM | POA: Diagnosis not present

## 2020-03-24 DIAGNOSIS — D62 Acute posthemorrhagic anemia: Secondary | ICD-10-CM

## 2020-03-24 LAB — LIPID PANEL
Cholesterol: 64 mg/dL (ref 0–200)
HDL: 15 mg/dL — ABNORMAL LOW (ref >40)
LDL Cholesterol: 18 mg/dL (ref 0–99)
Total CHOL/HDL Ratio: 4.3 RATIO
Triglycerides: 154 mg/dL — ABNORMAL HIGH (ref <150)
VLDL: 31 mg/dL (ref 0–40)

## 2020-03-24 LAB — BASIC METABOLIC PANEL
Anion gap: 8 (ref 5–15)
BUN: 58 mg/dL — ABNORMAL HIGH (ref 8–23)
CO2: 26 mmol/L (ref 22–32)
Calcium: 8.9 mg/dL (ref 8.9–10.3)
Chloride: 100 mmol/L (ref 98–111)
Creatinine, Ser: 1.21 mg/dL (ref 0.61–1.24)
GFR, Estimated: >60 mL/min (ref >60)
Glucose, Bld: 138 mg/dL — ABNORMAL HIGH (ref 70–99)
Potassium: 5.7 mmol/L — ABNORMAL HIGH (ref 3.5–5.1)
Sodium: 134 mmol/L — ABNORMAL LOW (ref 135–145)

## 2020-03-24 LAB — PROTIME-INR
INR: 2.5 — ABNORMAL HIGH (ref 0.8–1.2)
Prothrombin Time: 26.1 seconds — ABNORMAL HIGH (ref 11.4–15.2)

## 2020-03-24 LAB — GLUCOSE, CAPILLARY
Glucose-Capillary: 134 mg/dL — ABNORMAL HIGH (ref 70–99)
Glucose-Capillary: 172 mg/dL — ABNORMAL HIGH (ref 70–99)
Glucose-Capillary: 156 mg/dL — ABNORMAL HIGH (ref 70–99)
Glucose-Capillary: 167 mg/dL — ABNORMAL HIGH (ref 70–99)
Glucose-Capillary: 191 mg/dL — ABNORMAL HIGH (ref 70–99)

## 2020-03-24 LAB — CBC
HCT: 21.3 % — ABNORMAL LOW (ref 39.0–52.0)
Hemoglobin: 6.5 g/dL — CL (ref 13.0–17.0)
MCH: 33.2 pg (ref 26.0–34.0)
MCHC: 30.5 g/dL (ref 30.0–36.0)
MCV: 108.7 fL — ABNORMAL HIGH (ref 80.0–100.0)
Platelets: 275 10*3/uL (ref 150–400)
RBC: 1.96 MIL/uL — ABNORMAL LOW (ref 4.22–5.81)
RDW: 14.3 % (ref 11.5–15.5)
WBC: 13.1 10*3/uL — ABNORMAL HIGH (ref 4.0–10.5)
nRBC: 0.4 % — ABNORMAL HIGH (ref 0.0–0.2)

## 2020-03-24 LAB — HEPATIC FUNCTION PANEL
ALT: 30 U/L (ref 0–44)
AST: 22 U/L (ref 15–41)
Albumin: 2 g/dL — ABNORMAL LOW (ref 3.5–5.0)
Alkaline Phosphatase: 73 U/L (ref 38–126)
Bilirubin, Direct: <0.1 mg/dL (ref 0.0–0.2)
Total Bilirubin: 0.5 mg/dL (ref 0.3–1.2)
Total Protein: 6 g/dL — ABNORMAL LOW (ref 6.5–8.1)

## 2020-03-24 LAB — HEMOGLOBIN AND HEMATOCRIT, BLOOD
HCT: 20.7 % — ABNORMAL LOW (ref 39.0–52.0)
HCT: 22.9 % — ABNORMAL LOW (ref 39.0–52.0)
Hemoglobin: 6.4 g/dL — CL (ref 13.0–17.0)
Hemoglobin: 7.4 g/dL — ABNORMAL LOW (ref 13.0–17.0)

## 2020-03-24 LAB — PREPARE RBC (CROSSMATCH)

## 2020-03-24 MED ORDER — SODIUM ZIRCONIUM CYCLOSILICATE 10 G PO PACK
10.0000 g | PACK | Freq: Once | ORAL | Status: AC
  Administered 2020-03-24: 10 g via ORAL
  Filled 2020-03-24: qty 1

## 2020-03-24 MED ORDER — SODIUM CHLORIDE 0.9% IV SOLUTION: Freq: Once | INTRAVENOUS | Status: DC

## 2020-03-24 MED ORDER — FUROSEMIDE 10 MG/ML IJ SOLN
20.0000 mg | Freq: Once | INTRAMUSCULAR | Status: AC
  Administered 2020-03-24: 20 mg via INTRAVENOUS
  Filled 2020-03-24: qty 2

## 2020-03-24 MED ORDER — GERHARDT'S BUTT CREAM
TOPICAL_CREAM | CUTANEOUS | Status: DC | PRN
  Filled 2020-03-24: qty 1

## 2020-03-24 MED ORDER — PANTOPRAZOLE SODIUM 40 MG IV SOLR
40.0000 mg | Freq: Two times a day (BID) | INTRAVENOUS | Status: DC
  Administered 2020-03-24 – 2020-03-27 (×8): 40 mg via INTRAVENOUS
  Filled 2020-03-24 (×8): qty 40

## 2020-03-24 NOTE — Consult Note (Signed)
Referring Provider: [facility] Primary Care Physician:  [name], [name], MD Primary Gastroenterologist:  Althia Forts  Reason for Consultation:  Melena, anemia  HPI: Isaiah [name] is a 79 y.o. male with an extensive past medical history noted below to include A fib (on Coumadin), CAD s/p CABG and stenting, and CHF currently hospitalized for severe sepsis with shock secondary to cholecystitis, acute on chronic CHF, and NSTEMI presenting for consultation of melena and anemia.  Patient very lethargic and constantly falls asleep during interview.  He reports abdominal pain and black stools.  He is unable to tell me whether or not he had melena prior to admission.  Denies nausea or vomiting, hematemesis, or hematochezia.  He is on Coumadin for A fib. Unable to tell me whether or not he uses NSAIDs.  Denies family history of colon cancer or gastrointestinal malignancy. He has never had an EGD or colonoscopy.  Denies prior episodes of bleeding.  Past Medical History:  Diagnosis Date  . Atrial fibrillation ([place])    on Coumadin  . CAD (coronary artery disease)    s/p remote CABG, stent in 2019  . CHF (congestive heart failure) ([place])   . Dementia ([place])   . Diabetes mellitus   . DVT (deep venous thrombosis) ([place])   . Fall at home 10/2015  . Hyperlipidemia   . Hypertension   . Left-sided carotid artery disease ([place])   . MRSA infection   . Myocardial infarction ([place])   . Obesity   . Peripheral vascular disease ([place])   . Sleep apnea   . Stroke ([place])   . Stroke ([place])   . Venous insufficiency     Past Surgical History:  Procedure Laterality Date  . CORONARY ARTERY BYPASS GRAFT  1997   vein harvest from right leg  . CORONARY STENT INTERVENTION N/A 08/07/2016   Procedure: Coronary Stent Intervention;  Surgeon: Jettie Booze, MD;  Location: [facility];  Service: Cardiovascular;  Laterality: N/A;  RCA  . ESOPHAGOGASTRODUODENOSCOPY N/A 08/12/2016   Procedure: ESOPHAGOGASTRODUODENOSCOPY  (EGD);  Surgeon: Laurence Spates, MD;  Location: [facility] ENDOSCOPY;  Service: Endoscopy;  Laterality: N/A;  . INGUINAL HERNIA REPAIR  1980's   Bilateral,  done in [place]  . IR PERC CHOLECYSTOSTOMY  03/16/2020  . LEFT HEART CATH AND CORS/GRAFTS ANGIOGRAPHY N/A 08/07/2016   Procedure: Left Heart Cath and Cors/Grafts Angiography;  Surgeon: Jettie Booze, MD;  Location: [facility];  Service: Cardiovascular;  Laterality: N/A;  . PR VEIN BYPASS GRAFT,AORTO-FEM-POP  1980   FEM-FEM BPG by Dr. Deon Pilling  . RADIOLOGY WITH ANESTHESIA N/A 03/15/2020   Procedure: IR WITH ANESTHESIA;  Surgeon: Aletta Edouard, MD;  Location: [facility] [place];  Service: Radiology;  Laterality: N/A;    Prior to Admission medications   Medication Sig Start Date End Date Taking? Authorizing Provider  acetaminophen (TYLENOL) 500 MG tablet Take 500-1,000 mg by mouth every 6 (six) hours as needed for mild pain or headache.   Yes [provider]  allopurinol (ZYLOPRIM) 100 MG tablet TAKE 2 TABLETS BY MOUTH DAILY. Patient taking differently: Take 100 mg by mouth 2 (two) times daily.  05/03/14  Yes Lorretta Harp, MD  colchicine 0.6 MG tablet Take 0.6 mg by mouth daily as needed (gout attacks).   Yes [provider]  ferrous sulfate 325 (65 FE) MG tablet Take 1 tablet (325 mg total) by mouth 2 (two) times daily with a meal. Patient taking differently: Take 325 mg by mouth daily with  breakfast.  08/09/16  Yes Ghimire, Henreitta Leber, MD  furosemide (LASIX) 40 MG tablet Take 1 tablet (40 mg total) by mouth 2 (two) times daily. Patient taking differently: Take 40 mg by mouth in the morning.  08/24/16  Yes Bonnielee Haff, MD  gabapentin (NEURONTIN) 300 MG capsule Take 300-600 mg by mouth See admin instructions. Take 300 mg by mouth three times a day and 600 mg at bedtime   Yes [provider]  glipiZIDE (GLUCOTROL) 5 MG tablet Take 5 mg by mouth 2 (two) times daily before a meal.   Yes [provider]   hydrALAZINE (APRESOLINE) 100 MG tablet Take 100 mg by mouth 2 (two) times daily.   Yes [provider]  insulin aspart protamine- aspart (NOVOLOG MIX 70/30) (70-30) 100 UNIT/ML injection Inject 50 Units into the skin 2 (two) times daily before a meal.    Yes [provider]  isosorbide mononitrate (IMDUR) 60 MG 24 hr tablet Take 1 tablet (60 mg total) by mouth daily. Appointment needed for future refills Patient taking differently: Take 60 mg by mouth daily.  05/03/15  Yes Lorretta Harp, MD  metolazone (ZAROXOLYN) 2.5 MG tablet Take 2.5 mg by mouth daily as needed (AS DIRECTED FOR MARKED SWELLING).   Yes [provider]  Multiple Vitamin (MULTIVITAMIN WITH MINERALS) TABS tablet Take 1 tablet by mouth daily.   Yes [provider]  nitroGLYCERIN (NITROSTAT) 0.4 MG SL tablet Place 0.4 mg under the tongue every 5 (five) minutes as needed for chest pain.   Yes [provider]  pantoprazole (PROTONIX) 40 MG tablet Take 40 mg by mouth 2 (two) times daily before a meal.    Yes [provider]  potassium chloride (K-DUR) 10 MEQ tablet Take 1 tablet (10 mEq total) by mouth daily. 08/31/16  Yes Barrett, Evelene Croon, PA-C  pravastatin (PRAVACHOL) 40 MG tablet Take 40 mg by mouth at bedtime.    Yes [provider]  PRESCRIPTION MEDICATION Inhale into the lungs See admin instructions. CPAP- At bedtime and during any time of rest   Yes [provider]  Psyllium (METAMUCIL PO) Take 2 capsules by mouth at bedtime.   Yes [provider]  QUEtiapine (SEROQUEL) 50 MG tablet Take 50 mg by mouth at bedtime.    Yes [provider]  warfarin (COUMADIN) 1 MG tablet Take 1 mg by mouth daily as needed (in conjunction with the 5 mg strength, AS DIRECTED- depending on INR).  12/18/19  Yes [provider]  warfarin (COUMADIN) 5 MG tablet Take 5 mg by mouth daily with supper. 01/28/20  Yes [provider]  docusate sodium  (COLACE) 100 MG capsule Take 1 capsule (100 mg total) by mouth 2 (two) times daily. Patient not taking: Reported on 03/14/2020 12/16/18   Judith Part, MD  oxyCODONE (OXY IR/ROXICODONE) 5 MG immediate release tablet Take 1 tablet (5 mg total) by mouth every 4 (four) hours as needed (pain). Patient not taking: Reported on 03/14/2020 12/16/18   Judith Part, MD  sodium chloride 0.9 % injection Instill 5 mL daily into percutaneous drain. 03/21/20 05/13/20  Candiss Norse A, PA-C    Scheduled Meds: . sodium chloride   Intravenous Once  . acetaminophen (TYLENOL) oral liquid 160 mg/5 mL  650 mg Oral Q4H  . acetaminophen  650 mg Rectal Q4H  . allopurinol  100 mg Oral BID  . amoxicillin-clavulanate  1 tablet Oral Q12H  . chlorhexidine gluconate (MEDLINE KIT)  15 mL Mouth Rinse BID  . docusate  100 mg Oral BID  . ferrous sulfate  325 mg Oral Q breakfast  . gabapentin  300 mg Oral Q12H  . hydrALAZINE  25 mg Oral Q8H  . insulin aspart  0-20 Units Subcutaneous Q4H  . insulin glargine  20 Units Subcutaneous Daily  . isosorbide mononitrate  30 mg Oral Daily  . mouth rinse  15 mL Mouth Rinse BID  . pantoprazole (PROTONIX) IV  40 mg Intravenous Q12H  . Ensure Max Protein  11 oz Oral BID  . QUEtiapine  50 mg Oral Q supper  . rosuvastatin  10 mg Oral QHS  . sodium chloride flush  3 mL Intravenous Q12H  . sodium chloride flush  5 mL Intracatheter Q8H  . tamsulosin  0.4 mg Oral QPC supper   Continuous Infusions: . sodium chloride Stopped (03/17/20 2016)   PRN Meds:.guaiFENesin-dextromethorphan, hydrALAZINE, loperamide, ondansetron **OR** ondansetron (ZOFRAN) IV, traMADol  Allergies as of 03/14/2020  . (No Known Allergies)    Family History  Problem Relation Age of Onset  . Other Mother        varicose veins  . Heart disease Mother   . Hyperlipidemia Mother   . Hypertension Mother   . Varicose Veins Mother   . Cancer Father   . Diabetes Father   . Heart disease Father         before age 42  . Hyperlipidemia Father   . Hypertension Father   . Other Father        varicose veins  . Heart attack Father   . Diabetes Daughter   . Hyperlipidemia Daughter   . Hypertension Daughter   . Other Daughter        varicose veins  . Varicose Veins Daughter   . Hypertension Son   . Diabetes Sister   . Heart disease Sister        DVT  . Other Sister        varicose veins  . Hyperlipidemia Sister   . Hypertension Sister   . Varicose Veins Sister   . Other Brother        varicose veins  . Hyperlipidemia Brother   . Hypertension Brother   . Heart attack Brother     Social History   Socioeconomic History  . Marital status: Divorced    Spouse name: Not on file  . Number of children: Not on file  . Years of education: Not on file  . Highest education level: Not on file  Occupational History  . Not on file  Tobacco Use  . Smoking status: Former Smoker    Types: Cigarettes    Quit date: 05/14/1978    Years since quitting: 41.8  . Smokeless tobacco: Never Used  Substance and Sexual Activity  . Alcohol use: No  . Drug use: No  . Sexual activity: Not on file  Other Topics Concern  . Not on file  Social History Narrative  . Not on file   Social Determinants of Health   Financial Resource Strain:   . Difficulty of Paying Living Expenses: Not on file  Food Insecurity:   . Worried About Charity fundraiser in the Last Year: Not on file  . Ran Out of Food in the Last Year: Not on file  Transportation Needs:   . Lack of Transportation (Medical): Not on file  . Lack of Transportation (Non-Medical): Not on file  Physical Activity:   . Days  of Exercise per Week: Not on file  . Minutes of Exercise per Session: Not on file  Stress:   . Feeling of Stress : Not on file  Social Connections:   . Frequency of Communication with Friends and Family: Not on file  . Frequency of Social Gatherings with Friends and Family: Not on file  . Attends Religious Services: Not  on file  . Active Member of Clubs or Organizations: Not on file  . Attends Archivist Meetings: Not on file  . Marital Status: Not on file  Intimate Partner Violence:   . Fear of Current or Ex-Partner: Not on file  . Emotionally Abused: Not on file  . Physically Abused: Not on file  . Sexually Abused: Not on file    Review of Systems: Review of Systems  Constitutional: Positive for malaise/fatigue. Negative for weight loss.  HENT: Positive for hearing loss. Negative for sore throat.   Eyes: Negative for pain and redness.  Respiratory: Negative for cough and shortness of breath.   Cardiovascular: Negative for chest pain and palpitations.  Gastrointestinal: Positive for abdominal pain, diarrhea and melena. Negative for blood in stool, constipation, heartburn, nausea and vomiting.  Genitourinary: Negative for flank pain and hematuria.  Musculoskeletal: Negative for falls and joint pain.  Skin: Negative for itching and rash.  Neurological: Negative for seizures and loss of consciousness.  Endo/Heme/Allergies: Negative for polydipsia. Does not bruise/bleed easily.  Psychiatric/Behavioral: Negative for substance abuse. The patient is not nervous/anxious.      Physical Exam: Vital signs: Vitals:   03/24/20 1035 03/24/20 1115  BP: (!) 113/47 (!) 113/43  Pulse: 70 65  Resp: 20 19  Temp: 98.1 F (36.7 C) 99 F (37.2 C)  SpO2: 100% 100%   Last BM Date: 03/24/20 Physical Exam Vitals reviewed.  Constitutional:      General: He is not in acute distress.    Appearance: He is obese.  HENT:     Head: Normocephalic and atraumatic.     Nose: Nose normal. No congestion.     Mouth/Throat:     Mouth: Mucous membranes are moist.     Pharynx: Oropharynx is clear.  Eyes:     General: No scleral icterus.    Extraocular Movements: Extraocular movements intact.     Comments: Conjunctival pallor  Cardiovascular:     Rate and Rhythm: Normal rate and regular rhythm.     Pulses:  Normal pulses.  Pulmonary:     Effort: Pulmonary effort is normal. No respiratory distress.     Breath sounds: Normal breath sounds.  Abdominal:     General: There is no distension.     Palpations: Abdomen is soft. There is no mass.     Tenderness: There is abdominal tenderness (moderate, epigastric, RUQ). There is guarding. There is no rebound.     Hernia: No hernia is present.  Musculoskeletal:        General: No swelling or tenderness.     Cervical back: Normal range of motion and neck supple.  Skin:    General: Skin is warm and dry.  Neurological:     General: No focal deficit present.     Mental Status: He is oriented to person, place, and time. He is lethargic.  Psychiatric:        Mood and Affect: Mood normal.        Behavior: Behavior normal. Behavior is cooperative.     GI:  Lab Results: Recent Labs    03/22/20  0117 03/22/20 0117 03/23/20 0022 03/24/20 0028 03/24/20 0543  WBC 11.3*  --  12.3* 13.1*  --   HGB 9.4*   < > 7.8* 6.5* 6.4*  HCT 30.0*   < > 25.1* 21.3* 20.7*  PLT 269  --  257 275  --    < > = values in this interval not displayed.   BMET Recent Labs    03/22/20 0117 03/23/20 0022 03/24/20 0028  NA 134* 135 134*  K 4.7 5.0 5.7*  CL 96* 99 100  CO2 '30 30 26  ' GLUCOSE 173* 147* 138*  BUN 53* 53* 58*  CREATININE 1.38* 1.25* 1.21  CALCIUM 8.5* 8.8* 8.9   LFT Recent Labs    03/24/20 0028  PROT 6.0*  ALBUMIN 2.0*  AST 22  ALT 30  ALKPHOS 73  BILITOT 0.5  BILIDIR <0.1  IBILI NOT CALCULATED   PT/INR Recent Labs    03/23/20 0022 03/24/20 0028  LABPROT 27.8* 26.1*  INR 2.7* 2.5*     Studies/Results: No results found.  Impression: Suspected upper GI bleeding in the setting of Coumadin use: Melena, anemia.  PUD vs. Gastritis most likely. -Hgb has been gradually decreasing over the past week.  Today, Hgb 6.5 as compared to 7.8 yesterday. -BUN elevated to 58 with Cr of 1.21.  Creatinine has been gradually decreasing, but BUN remains  elevated, suggestive of upper GI bleeding. -INR 2.5 today, last dose of Coumadin 11/10  Sepsis secondary to cholecystitis, cholecystostomy tube in place  Acute on chronic heart failure, cardiogenic shock.  Last EF 20% as of 11/3  A fib, on Coumadin  Plan: Continue to hold Coumadin.  Continue Protonix IV BID.  Continue to monitor H&H with transfusion as needed to maintain Hgb >8.  Due to INR of 2.5, do not recommend proceeding with EGD until INR <2.0.  Due to acute on chronic heart failure with cardiogenic shock, patient may not be a candidate for EGD at this time unless destabilizing bleeding may occur.   LOS: 10 days   Salley Slaughter  PA-C 03/24/2020, 12:02 PM  Contact #  [phone]

## 2020-03-24 NOTE — Progress Notes (Signed)
Physical Therapy Treatment Patient Details Name: FAHAD CISSE MRN: [id number] DOB: 1941-02-26 Today's Date: 03/24/2020    History of Present Illness FENDER HERDER is a 79 y.o. male with medical history significant of PVD; CAD; HTN; HLD; DM; afib on Coumadin; and CHF presenting with weakness and a buttock pressure ulcer. Pt with sepsis and R UQ abdominal pain, found to have cholecystitis. 11/2 Pt was undergoing anesthesia for a perc chole drain and coded. 2023/04/04 perc chole performed. Pt intubated 11/2-11/4    PT Comments    Pt found to be incontinent of stool on entry. Pt requires maxA for rolling for pericare. Treatment limited due to need for blood transfusion. PT continues to recommend SNF level rehab given increased level of care required, however plan is for discharge home when medically ready. PT will continue to follow to progress mobility.      Follow Up Recommendations  SNF;Supervision/Assistance - 24 hour     Equipment Recommendations  None recommended by PT       Precautions / Restrictions Precautions Precautions: Fall Precaution Comments: incontinent of bowel, perc chole drain, sacral wound    Mobility  Bed Mobility Overal bed mobility: Needs Assistance Bed Mobility: Rolling;Sidelying to Sit Rolling: Max assist         General bed mobility comments: pt requires maxA for rolling for pericare.            Cognition Arousal/Alertness: Awake/alert Behavior During Therapy: Flat affect Overall Cognitive Status: Impaired/Different from baseline Area of Impairment: Problem solving;Safety/judgement;Following commands;Orientation                 Orientation Level: Disoriented to;Situation Current Attention Level: Sustained Memory: Decreased short-term memory Following Commands: Follows one step commands with increased time;Follows one step commands inconsistently Safety/Judgement: Decreased awareness of safety;Decreased awareness of deficits   Problem  Solving: Slow processing;Decreased initiation;Difficulty sequencing;Requires verbal cues;Requires tactile cues General Comments: pt HOH, slow to respond and lacks awareness of deficits. Incontinent of bowel on arrival.         General Comments General comments (skin integrity, edema, etc.): VSS on 2L O2 via [place]      Pertinent Vitals/Pain Pain Assessment: Faces Faces Pain Scale: Hurts little more Pain Location: generalized moaning with movement, at rest reports no pain, with mobility reports everywhere Pain Descriptors / Indicators: Aching;Moaning Pain Intervention(s): Limited activity within patient's tolerance;Monitored during session;Repositioned           PT Goals (current goals can now be found in the care plan section) Acute Rehab PT Goals Patient Stated Goal: unable to state PT Goal Formulation: With patient/family Time For Goal Achievement: 03/30/20 Potential to Achieve Goals: Good Progress towards PT goals: Not progressing toward goals - comment    Frequency    Min 3X/week      PT Plan Current plan remains appropriate       AM-PAC PT "6 Clicks" Mobility   Outcome Measure  Help needed turning from your back to your side while in a flat bed without using bedrails?: Total Help needed moving from lying on your back to sitting on the side of a flat bed without using bedrails?: Total Help needed moving to and from a bed to a chair (including a wheelchair)?: Total Help needed standing up from a chair using your arms (e.g., wheelchair or bedside chair)?: Total Help needed to walk in hospital room?: Total Help needed climbing 3-5 steps with a railing? : Total 6 Click Score: 6    End of  Session Equipment Utilized During Treatment: Oxygen Activity Tolerance: Patient tolerated treatment well;Treatment limited secondary to medical complications (Comment) (pt requires blood transfusion ) Patient left: in bed;with call bell/phone within reach;with family/visitor  present;with nursing/sitter in room Nurse Communication: Mobility status;Need for lift equipment PT Visit Diagnosis: Unsteadiness on feet (R26.81);Muscle weakness (generalized) (M62.81);Difficulty in walking, not elsewhere classified (R26.2)     Time: 7121-9758 PT Time Calculation (min) (ACUTE ONLY): 11 min  Charges:  $Therapeutic Activity: 8-22 mins                     Alencia Gordon B. Beverely Risen PT, DPT Acute Rehabilitation Services Pager [phone] Office [phone]    Elon Alas Fleet 03/24/2020, 3:42 PM

## 2020-03-24 NOTE — Progress Notes (Signed)
Progress Note  Patient Name: Isaiah [name] Date of Encounter: 03/24/2020  [facility] Cardiologist: Quay Burow, MD   Subjective   No chest pain;   Inpatient Medications    Scheduled Meds: . sodium chloride   Intravenous Once  . acetaminophen (TYLENOL) oral liquid 160 mg/5 mL  650 mg Oral Q4H  . acetaminophen  650 mg Rectal Q4H  . allopurinol  100 mg Oral BID  . amoxicillin-clavulanate  1 tablet Oral Q12H  . chlorhexidine gluconate (MEDLINE KIT)  15 mL Mouth Rinse BID  . docusate  100 mg Oral BID  . ferrous sulfate  325 mg Oral Q breakfast  . gabapentin  300 mg Oral Q12H  . hydrALAZINE  25 mg Oral Q8H  . insulin aspart  0-20 Units Subcutaneous Q4H  . insulin glargine  20 Units Subcutaneous Daily  . isosorbide mononitrate  30 mg Oral Daily  . mouth rinse  15 mL Mouth Rinse BID  . pantoprazole (PROTONIX) IV  40 mg Intravenous Q12H  . Ensure Max Protein  11 oz Oral BID  . QUEtiapine  50 mg Oral Q supper  . rosuvastatin  10 mg Oral QHS  . sodium chloride flush  3 mL Intravenous Q12H  . sodium chloride flush  5 mL Intracatheter Q8H  . sodium zirconium cyclosilicate  10 g Oral Once  . tamsulosin  0.4 mg Oral QPC supper   Continuous Infusions: . sodium chloride Stopped (03/17/20 2016)   PRN Meds: guaiFENesin-dextromethorphan, hydrALAZINE, loperamide, ondansetron **OR** ondansetron (ZOFRAN) IV, traMADol   Vital Signs    Vitals:   03/23/20 1919 03/24/20 0000 03/24/20 0500 03/24/20 0750  BP: (!) 112/41 (!) 107/42 (!) 103/41 (!) 111/47  Pulse: 70 70 88 71  Resp: 20 (!) '21 18 19  ' Temp: 97.7 F (36.5 C) 97.6 F (36.4 C) 97.9 F (36.6 C) 98.8 F (37.1 C)  TempSrc: Oral Oral Oral Axillary  SpO2: 100% 99% 97% 99%  Weight:   98.4 kg   Height:        Intake/Output Summary (Last 24 hours) at 03/24/2020 0919 Last data filed at 03/24/2020 0000 Gross per 24 hour  Intake 11 ml  Output 1100 ml  Net -1089 ml   I/O since admission: -2905   Last 3 Weights  03/24/2020 03/21/2020 03/18/2020  Weight (lbs) 216 lb 14.9 oz 225 lb 5 oz 217 lb 2.5 oz  Weight (kg) 98.4 kg 102.2 kg 98.5 kg      Telemetry    Atrial fibrillation in the 60s; rare isolated PVC - Personally Reviewed  ECG    No new tracing  Physical Exam   BP (!) 111/47 (BP Location: Left Arm)   Pulse 71   Temp 98.8 F (37.1 C) (Axillary)   Resp 19   Ht '5\' 9"'  (1.753 m)   Wt 98.4 kg   SpO2 99%   BMI 32.04 kg/m  General: Alert, oriented, no distress.  Skin: normal turgor, no rashes, warm and dry HEENT: Normocephalic, atraumatic. Pupils equal round and reactive to light; sclera anicteric; extraocular muscles intact;  Nose without nasal septal hypertrophy Mouth/Parynx benign; Mallinpatti scale 3 Neck: No JVD, no carotid bruits; normal carotid upstroke Lungs: clear to ausculatation and percussion; no wheezing or rales Chest wall: without tenderness to palpitation Heart: PMI not displaced, RRR, s1 s2 normal, 1/6 systolic murmur, no diastolic murmur, no rubs, gallops, thrills, or heaves Abdomen: mild tenderness RUQ , BS+; abdominal aorta nontender and not dilated by palpation. Back: no CVA  tenderness Pulses 2+ Musculoskeletal: full range of motion, normal strength, no joint deformities Extremities: no clubbing cyanosis or edema, Homan's sign negative  Neurologic: grossly nonfocal; Cranial nerves grossly wnl Psychologic: Normal mood and affect   Labs    High Sensitivity Troponin:   Recent Labs  Lab 03/15/20 1705 03/15/20 1916 03/16/20 0008 03/16/20 0309  TROPONINIHS 61* 308* 982* 1,364*      Chemistry Recent Labs  Lab 03/22/20 0117 03/23/20 0022 03/24/20 0028  NA 134* 135 134*  K 4.7 5.0 5.7*  CL 96* 99 100  CO2 '30 30 26  ' GLUCOSE 173* 147* 138*  BUN 53* 53* 58*  CREATININE 1.38* 1.25* 1.21  CALCIUM 8.5* 8.8* 8.9  PROT  --   --  6.0*  ALBUMIN  --   --  2.0*  AST  --   --  22  ALT  --   --  30  ALKPHOS  --   --  73  BILITOT  --   --  0.5  GFRNONAA 52* 59*  >60  ANIONGAP '8 6 8     ' Hematology Recent Labs  Lab 03/22/20 0117 03/22/20 0117 03/23/20 0022 03/24/20 0028 03/24/20 0543  WBC 11.3*  --  12.3* 13.1*  --   RBC 2.77*  2.82*  --  2.32* 1.96*  --   HGB 9.4*   < > 7.8* 6.5* 6.4*  HCT 30.0*   < > 25.1* 21.3* 20.7*  MCV 108.3*  --  108.2* 108.7*  --   MCH 33.9  --  33.6 33.2  --   MCHC 31.3  --  31.1 30.5  --   RDW 13.9  --  14.1 14.3  --   PLT 269  --  257 275  --    < > = values in this interval not displayed.    BNP Recent Labs  Lab 03/22/20 0117  BNP 441.2*    Lipid Panel     Component Value Date/Time   CHOL 64 03/24/2020 0028   TRIG 154 (H) 03/24/2020 0028   HDL 15 (L) 03/24/2020 0028   CHOLHDL 4.3 03/24/2020 0028   VLDL 31 03/24/2020 0028   LDLCALC 18 03/24/2020 0028   LDLDIRECT 43 01/28/2013 1127    DDimer No results for input(s): DDIMER in the last 168 hours.   Radiology    No results found.  Cardiac Studies   ECHO: 03/16/20 IMPRESSIONS 1. Severe biventricular dysfunction with at least mild-moderate secondary  MR and TR.  2. Left ventricular ejection fraction, by estimation, is 20%. The left  ventricle has severely decreased function. The left ventricle demonstrates  global hypokinesis. There is mild left ventricular hypertrophy. Left  ventricular diastolic parameters are  indeterminate. There is the interventricular septum is flattened in  systole and diastole, consistent with right ventricular pressure and  volume overload.  3. Right ventricular systolic function is severely reduced. The right  ventricular size is severely enlarged.  4. Left atrial size was mild to moderately dilated.  5. Right atrial size was severely dilated.  6. The mitral valve is normal in structure, and demonstrates tenting due  to LV dysfunction. Mild to moderate mitral valve regurgitation. No  evidence of mitral stenosis.  7. Tricuspid valve regurgitation is mild to moderate.  8. The aortic valve is grossly  normal. There is mild calcification of the  aortic valve. Aortic valve regurgitation is trivial. No aortic stenosis is  present.   Conclusion(s)/Recommendation(s): No definite left ventricular mural or  apical  thrombus/thrombi. Swirling apical contrast.   Patient Profile     79 y.o. male with acute cholecystitis and PEA arrest, noted to have severe LV dysfunction post-arrest and also elevated troponin consistent with demand ischemia of the myocardium  Assessment & Plan    1. Acute on Chronic combined HF with associated cardiogenic shock: Required pressors, intubation. Now improved, extubated on 11/4. Echo noted above with severe biventricular dysfunction, EF of 20% with severely dilated right atrium, and moderately dilated left atrium. HF therapies have been very limited with AKI and significant bradycardia. He has been diuresed with IV lasix.  I/O since admission now -2905.  With initial AKI, low-dose isosorbide 15 mg and hydralazine 10 mg every 8 hours was added 11/8 and hydralazine was titrated to 25 mg every 8 hours 11/9, and Imdur increased to 30 mg yesterday.  BNP 441.  BP 112/50.   2. Elevated Troponin: known underlying CAD with hx of CABG with PCI. Peak troponin 1364. He is not a good candidate for invasive therapy with his co morbid conditions.  Continue with medical therapy.  3. Chronic AFib: remains in Afib, rate controlled currently in the low sixties. He has been on coumadin, currently on hold, INR elevated at 3.6  > 2.7 > 2.7 > 2.5; coumadin on hold with GI blood loss.  4. CKD with AKI: Cr peaked at 2.51>> 1.6 >> 1.38 >> 1.25  >> 1.21  5.  Anemia: H/H 9.4/30 > 7.8/25.1 > 6.4/20.7.  Prior ferritin level normal at 118. Fe sat 17%.  Stool guaiacs positive.  Will need  PRBC transfusion; scheduled for 2 units, with EF 20% will give furosemide 20 mg iv in between units. Consider GI evaluation.  6. Acute cholecystitis with septic shock: now s/p perc drain. Management per IR --  antibiotics per primary for 14 days  7.  Hyperlipidemia: Currently on low-dose rosuvastatin at 10 mg.  LDL 18 on low dose rosuvastatin, LFTs nl.  8. Pleuritic chest pain: Resolved  9. DM: Hgb A1c was 12.1 on admission -- on 70/30 and glipizide PTA -- Diabetes coordinator following  10. Macrocytosis: B12 level 889, folate level 16.6  11. OSA:  on CPAP  For questions or updates, please contact [place] Please consult [email or web address] for contact info under     Troy Sine, MD, [facility] 03/24/2020 9:19 AM

## 2020-03-24 NOTE — Progress Notes (Signed)
PROGRESS NOTE    Isaiah [name]  TDV:[id number] DOB: 12-Feb-1941 DOA: 03/14/2020 PCP: [name], [name], MD    Brief Narrative:  Isaiah [name] is a 79 year old male with past medical history notable for peripheral vascular disease, CAD, essential hypertension, hyperlipidemia, type 2 diabetes mellitus, atrial fibrillation on Coumadin, chronic systolic congestive heart failure who presented to the ED with weakness and a pressure ulcer on the buttock.  Patient also complaining of right upper quadrant pain.  In the ED, patient noted to be febrile with a leukocytosis.  Elevated creatinine.  CT abdomen/pelvis concerning for acute cholecystitis.  Patient was started on broad-spectrum antibiotics and IV fluids.  General surgery was consulted.  Hospital service consulted for admission for severe sepsis secondary to acute cholecystitis.   Assessment & Plan:   Principal Problem:   Sepsis due to undetermined organism [facility]) Active Problems:   Coronary artery disease   Chronic atrial fibrillation (HCC)   Essential hypertension   Hyperlipidemia   Type 2 diabetes mellitus with vascular disease (HCC)   Obstructive sleep apnea   CKD (chronic kidney disease), stage III ([place])   ARF (acute renal failure) (HCC)   Pressure injury of skin   Acute cholecystitis   Class 1 obesity due to excess calories with body mass index (BMI) of 33.0 to 33.9 in adult   Shock [facility])   Cardiac arrest ([place])   Acute systolic heart failure (HCC)   Severe sepsis with septic shock Patient presenting to the ED with fever, leukocytosis, renal failure and encephalopathy.  Shortly following admission, patient became hypotensive despite aggressive IV fluid resuscitation and was transferred to the ICU service for pressor support.  Patient was able to be weaned from vasopressors; and was transreferred back to the hospitalist service on 03/19/2020.  Acute cholecystitis Patient presenting to ED with right upper quadrant pain.  Imaging  notable for distended gallbladder with layering gallstone and mild pericholestatic stranding.  Given severity of illness requiring ICU level of care and pressure support, patient underwent percutaneous cholecystostomy by IR on 03/16/2020. --WBC --IV zosyn transitioned to Augmentin; plan to complete 14 day course (End: 11/15) --Continue cholecystostomy to gravity, flush 33m TID; output not documented last 24h  Acute on chronic combined systolic/diastolic congestive heart failure; decompensated with associated cardiogenic shock Patient presenting with septic shock as above with associated cholecystitis with likely underlying cardiogenic shock requiring vasopressors and ventilatory support, extubated on 03/17/2020 and weaned off vasopressors.  TTE with severe biventricular dysfunction with LVEF 20% with severely dilated RA, moderately dilated LA. --Cardiology following, appreciate assistance --net negative 1.1L past 24hrs and net negative 2.9L since admission --continue isosorbide mononitrate 30 mg p.o. daily --Hydralazine 25 mg q8h --avoiding ACEi/ARB 2/2 AKI, and BB 2/2 bradycardia  Macrocytic Anemia/iron deficiency:  Concern for UGIB --Iron 42, TIBC 253, Ferritin 118, foalte 16.6, B12 888, FOBT +  --Hgb 13.9>>>10.5>9.3>9.4>7.8>6.4 --Elevated BUN, 58  --s/p IV Feraheme 03/22/2020 --continue home ferrous sulfate 325 PO daily --Protonix 40 mg IV q12h --transfuse 2u pRBC today --repeat H/H 2 hours post transfusion and every 8 hours thereafter; goal hemoglobin greater than 8.0 given cardiac history --[facility] consulted for evaluation and consideration of EGD; n.p.o. until evaluation  Hyperkalemia Potassium 5.7, will give dose of Lokelma 10 g p.o. today --Continue monitor on telemetry --Repeat electrolytes in a.m.  Acute renal failure on CKD stage IIIb: Presented with creatinine 3.46 with baseline roughly 1.6. etiology likely secondary to prerenal failure from dehydration, sepsis, and shock as  above.  Now  appears back to baseline. --Cr 3.45>>>1.25>1.21 --Avoid nephrotoxins, renally dose all medications --Closely monitor renal function daily.  Chronic permanent atrial fibrillation Currently rate controlled.  On Coumadin for anticoagulation at home.  Was also on IV heparin for NSTEMI during hospitalization.  Hospital course remarkable for bradycardia cardiac episodes and amiodarone was discontinued.  Not on beta-blockers. --INR 2.5 today; will hold Coumadin secondary to drop in hemoglobin concerning for UGIB  Acute metabolic encephalopathy: Secondary to sepsis.  Currently alert and oriented.He is hard of hearing  Cough:  Most likely secondary to congestive heart failure.  No signs of pneumonia on chest xray.  Cough has improved.  Type II NSTEMI/elevated troponin:  Supply demand ischemia from sepsis/septic shock.  Cardiology were consulted with no plan for any intervention.  Urinary retention: resolved. Continue intermittent straight cath if needed. Started on flomax  Coronary artery disease:  No complaint of chest pain.  Continue Imdur.  Follow-up with cardiology as an outpatient. Status post CABG and PCI with stents.   --Holding  Hypertension:  Currently blood pressure stable.  Continue hydralazine, Imdur, aspirin and statin  Hyperlipidemia: On Crestor  Diabetes type 2:  Poorly controlled, hemoglobin A1c 12.1.  Diabetic coordinator was following. --Lantus 20 units [place] daily --Resistant NovoLog insulin sliding scale for coverage --CBGs qAC/HS  OSA: On CPAP at night  Obesity: BMI of 33.  Counseled on need for weight loss/lifestyle changes this complicates all facets of care.  Debility/deconditioning/memory deficits:  PT/OT consulted skilled nursing facility on discharge.  Patient lives with his daughter.  On Seroquel at home. Daughter Butch Penny wants to take him home with home health.  Stage II pressure injury, buttocks; POA Pressure Injury 03/14/20 Buttocks  Right Stage 2 -  Partial thickness loss of dermis presenting as a shallow open injury with a red, pink wound bed without slough. (Active)  03/14/20 2302  Location: Buttocks  Location Orientation: Right  Staging: Stage 2 -  Partial thickness loss of dermis presenting as a shallow open injury with a red, pink wound bed without slough.  Wound Description (Comments):   Present on Admission: Yes     Pressure Injury 03/14/20 Buttocks Left Stage 2 -  Partial thickness loss of dermis presenting as a shallow open injury with a red, pink wound bed without slough. (Active)  03/14/20 2300  Location: Buttocks  Location Orientation: Left  Staging: Stage 2 -  Partial thickness loss of dermis presenting as a shallow open injury with a red, pink wound bed without slough.  Wound Description (Comments):   Present on Admission: Yes  Wound care consulted and were following.   Continue local wound care, offloading.   DVT prophylaxis: Coumadin Code Status: Full code Family Communication: None present at bedside, attempted to update patient's daughter Butch Penny via telephone this morning; unsuccessful.  Disposition Plan:  Status is: Inpatient  Remains inpatient appropriate because:Unsafe d/c plan, IV treatments appropriate due to intensity of illness or inability to take PO and Inpatient level of care appropriate due to severity of illness   Dispo: The patient is from: Home              Anticipated d/c is to: Home              Anticipated d/c date is: 2 days              Patient currently is not medically stable to d/c.  Plan for EGD tomorrow for concern of upper GI bleed   Consultants:   PCCM  Interventional radiology  General surgery  Cardiology  [facility] gastroenterology, Dr. Alessandra Bevels  Procedures:   IR placement cholecystostomy on 03/17/2020  Antimicrobials:   Zosyn 11/2 - 11/5  Augmentin 11/5>> (end 11/15)  Metronidazole 11/1 - 11/2  Cefepime 11/2 -11/2  Ceftriaxone 11/2 -  11/2   Subjective: Patient seen and examined at bedside, resting comfortably.  Sleeping but easily arousable.  Feels fatigued and tired this morning.  Noted hemoglobin dropped to 6.4 and transfusing 2 units PRBCs.  GI consulted with plan EGD tomorrow.  Attempted to update patient's daughter via telephone unsuccessful.  No other complaints or concerns at this time. Denies headache, no dizziness, no chest pain, no palpitations, no shortness of breath, no abdominal pain.  No acute events overnight per nursing staff.  Objective: Vitals:   03/24/20 0750 03/24/20 1021 03/24/20 1035 03/24/20 1115  BP: (!) 111/47 (!) 113/36 (!) 113/47 (!) 113/43  Pulse: 71 66 70 65  Resp: 19 (!) _0 Temp: 98.8 F (37.1 C) 98 F (36.7 C) 98.1 F (36.7 C) 99 F (37.2 C)  TempSrc: Axillary Oral Oral Axillary  SpO2: 99% 99% 100% 100%  Weight:      Height:        Intake/Output Summary (Last 24 hours) at 03/24/2020 1138 Last data filed at 03/24/2020 0000 Gross per 24 hour  Intake 5 ml  Output 750 ml  Net -745 ml   Filed Weights   03/18/20 0400 03/21/20 1217 03/24/20 0500  Weight: 98.5 kg 102.2 kg 98.4 kg    Examination:  General exam: Appears calm and comfortable  Respiratory system: Clear to auscultation. Respiratory effort normal.  Oxygenating well on room air Cardiovascular system: S1 & S2 heard, RRR. No JVD, murmurs, rubs, gallops or clicks. No pedal edema. Gastrointestinal system: Abdomen is nondistended, soft, mild tenderness noted surrounding cholecystostomy site with dressing clean/dry/intact; but do note bloody drainage in cholecystostomy bag. No organomegaly or masses felt. Normal bowel sounds heard. Central nervous system: Alert and oriented. No focal neurological deficits Extremities: Symmetric 5 x 5 power. Skin: No rashes, lesions or ulcers Psychiatry: Judgement and insight appear poor. Mood & affect appropriate.     Data Reviewed: I have personally reviewed following labs and  imaging studies  CBC: Recent Labs  Lab 03/19/20 0053 03/19/20 0053 03/21/20 0023 03/22/20 0117 03/23/20 0022 03/24/20 0028 03/24/20 0543  WBC 8.2  --  10.4 11.3* 12.3* 13.1*  --   NEUTROABS  --   --   --   --  9.3*  --   --   HGB 10.5*   < > 9.3* 9.4* 7.8* 6.5* 6.4*  HCT 33.4*   < > 29.2* 30.0* 25.1* 21.3* 20.7*  MCV 106.0*  --  109.0* 108.3* 108.2* 108.7*  --   PLT 143*  --  210 269 257 275  --    < > = values in this interval not displayed.   Basic Metabolic Panel: Recent Labs  Lab 03/20/20 0857 03/21/20 0023 03/22/20 0117 03/23/20 0022 03/24/20 0028  NA 138 137 134* 135 134*  K 4.4 4.1 4.7 5.0 5.7*  CL 101 99 96* 99 100  CO2 _1 GLUCOSE 195* 164* 173* 147* 138*  BUN 51* 52* 53* 53* 58*  CREATININE 1.54* 1.60* 1.38* 1.25* 1.21  CALCIUM 8.5* 8.5* 8.5* 8.8* 8.9   GFR: Estimated Creatinine Clearance: 57.3 mL/min (by C-G formula based on SCr of 1.21 mg/dL). Liver Function Tests: Recent Labs  Lab  03/24/20 0028  AST 22  ALT 30  ALKPHOS 73  BILITOT 0.5  PROT 6.0*  ALBUMIN 2.0*   No results for input(s): LIPASE, AMYLASE in the last 168 hours. No results for input(s): AMMONIA in the last 168 hours. Coagulation Profile: Recent Labs  Lab 03/20/20 0857 03/21/20 0023 03/22/20 0117 03/23/20 0022 03/24/20 0028  INR 3.0* 3.6* 2.7* 2.7* 2.5*   Cardiac Enzymes: No results for input(s): CKTOTAL, CKMB, CKMBINDEX, TROPONINI in the last 168 hours. BNP (last 3 results) No results for input(s): PROBNP in the last 8760 hours. HbA1C: No results for input(s): HGBA1C in the last 72 hours. CBG: Recent Labs  Lab 03/23/20 2000 03/23/20 2334 03/24/20 0340 03/24/20 0814 03/24/20 1117  GLUCAP 159* 142* 134* 172* 156*   Lipid Profile: Recent Labs    03/24/20 0028  CHOL 64  HDL 15*  LDLCALC 18  TRIG 154*  CHOLHDL 4.3   Thyroid Function Tests: No results for input(s): TSH, T4TOTAL, FREET4, T3FREE, THYROIDAB in the last 72 hours. Anemia Panel: Recent  Labs    03/22/20 0117 03/23/20 1244  VITAMINB12 889 888  FOLATE 16.6  --   FERRITIN 118  --   TIBC 253  --   IRON 42*  --   RETICCTPCT 3.1  --    Sepsis Labs: No results for input(s): PROCALCITON, LATICACIDVEN in the last 168 hours.  Recent Results (from the past 240 hour(s))  MRSA PCR Screening     Status: None   Collection Time: 03/14/20 10:49 PM   Specimen: Nasal Mucosa; Nasopharyngeal  Result Value Ref Range Status   MRSA by PCR NEGATIVE NEGATIVE Final    Comment:        The GeneXpert MRSA Assay (FDA approved for NASAL specimens only), is one component of a comprehensive MRSA colonization surveillance program. It is not intended to diagnose MRSA infection nor to guide or monitor treatment for MRSA infections. Performed at [facility], [place] [address] [place], [place] [address]   Aerobic/Anaerobic Culture (surgical/deep wound)     Status: None   Collection Time: 03/16/20  5:13 PM   Specimen: PATH Other; Bile  Result Value Ref Range Status   Specimen Description BILE  Final   Special Requests NONE  Final   Gram Stain   Final    FEW WBC PRESENT,BOTH PMN AND MONONUCLEAR ABUNDANT GRAM POSITIVE COCCI    Culture   Final    ABUNDANT ENTEROCOCCUS FAECIUM NO ANAEROBES ISOLATED Performed at [facility], [address] [place], [place] [address]    Report Status 03/21/2020 FINAL  Final   Organism ID, Bacteria ENTEROCOCCUS FAECIUM  Final      Susceptibility   Enterococcus faecium - MIC*    AMPICILLIN <=2 SENSITIVE Sensitive     VANCOMYCIN <=0.5 SENSITIVE Sensitive     GENTAMICIN SYNERGY SENSITIVE Sensitive     * ABUNDANT ENTEROCOCCUS FAECIUM         Radiology Studies: No results found.      Scheduled Meds: . sodium chloride   Intravenous Once  . acetaminophen (TYLENOL) oral liquid 160 mg/5 mL  650 mg Oral Q4H  . acetaminophen  650 mg Rectal Q4H  . allopurinol  100 mg Oral BID  . amoxicillin-clavulanate  1 tablet Oral Q12H  .  chlorhexidine gluconate (MEDLINE KIT)  15 mL Mouth Rinse BID  . docusate  100 mg Oral BID  . ferrous sulfate  325 mg Oral Q breakfast  . gabapentin  300 mg  Oral Q12H  . hydrALAZINE  25 mg Oral Q8H  . insulin aspart  0-20 Units Subcutaneous Q4H  . insulin glargine  20 Units Subcutaneous Daily  . isosorbide mononitrate  30 mg Oral Daily  . mouth rinse  15 mL Mouth Rinse BID  . pantoprazole (PROTONIX) IV  40 mg Intravenous Q12H  . Ensure Max Protein  11 oz Oral BID  . QUEtiapine  50 mg Oral Q supper  . rosuvastatin  10 mg Oral QHS  . sodium chloride flush  3 mL Intravenous Q12H  . sodium chloride flush  5 mL Intracatheter Q8H  . tamsulosin  0.4 mg Oral QPC supper   Continuous Infusions: . sodium chloride Stopped (03/17/20 2016)     LOS: 10 days    Time spent: 38 minutes spent on chart review, discussion with nursing staff, consultants, updating family and interview/physical exam; more than 50% of that time was spent in counseling and/or coordination of care.    Brogan Martis J [place] DO [facility] Available via Epic secure chat 7am-7pm After these hours, please refer to coverage provider listed on [email or web address] 03/24/2020, 11:38 AM

## 2020-03-24 NOTE — Progress Notes (Signed)
Pt refused cpap

## 2020-03-25 ENCOUNTER — Encounter (HOSPITAL_COMMUNITY)
Admission: EM | Disposition: A | Discharge status: Home Health | Payer: Self-pay | Source: Home / Self Care | Attending: Internal Medicine

## 2020-03-25 DIAGNOSIS — I482 Chronic atrial fibrillation, unspecified: Secondary | ICD-10-CM | POA: Diagnosis not present

## 2020-03-25 DIAGNOSIS — I5082 Biventricular heart failure: Secondary | ICD-10-CM | POA: Diagnosis not present

## 2020-03-25 DIAGNOSIS — D500 Iron deficiency anemia secondary to blood loss (chronic): Secondary | ICD-10-CM | POA: Diagnosis not present

## 2020-03-25 DIAGNOSIS — N179 Acute kidney failure, unspecified: Secondary | ICD-10-CM | POA: Diagnosis not present

## 2020-03-25 DIAGNOSIS — A419 Sepsis, unspecified organism: Secondary | ICD-10-CM | POA: Diagnosis not present

## 2020-03-25 LAB — GLUCOSE, CAPILLARY
Glucose-Capillary: 137 mg/dL — ABNORMAL HIGH (ref 70–99)
Glucose-Capillary: 131 mg/dL — ABNORMAL HIGH (ref 70–99)
Glucose-Capillary: 147 mg/dL — ABNORMAL HIGH (ref 70–99)
Glucose-Capillary: 185 mg/dL — ABNORMAL HIGH (ref 70–99)
Glucose-Capillary: 223 mg/dL — ABNORMAL HIGH (ref 70–99)
Glucose-Capillary: 212 mg/dL — ABNORMAL HIGH (ref 70–99)
Glucose-Capillary: 148 mg/dL — ABNORMAL HIGH (ref 70–99)
Glucose-Capillary: 88 mg/dL (ref 70–99)

## 2020-03-25 LAB — CBC
HCT: 23.2 % — ABNORMAL LOW (ref 39.0–52.0)
Hemoglobin: 7.6 g/dL — ABNORMAL LOW (ref 13.0–17.0)
MCH: 33.9 pg (ref 26.0–34.0)
MCHC: 32.8 g/dL (ref 30.0–36.0)
MCV: 103.6 fL — ABNORMAL HIGH (ref 80.0–100.0)
Platelets: 254 10*3/uL (ref 150–400)
RBC: 2.24 MIL/uL — ABNORMAL LOW (ref 4.22–5.81)
RDW: 17 % — ABNORMAL HIGH (ref 11.5–15.5)
WBC: 12.7 10*3/uL — ABNORMAL HIGH (ref 4.0–10.5)
nRBC: 0.7 % — ABNORMAL HIGH (ref 0.0–0.2)

## 2020-03-25 LAB — BASIC METABOLIC PANEL
Anion gap: 8 (ref 5–15)
BUN: 61 mg/dL — ABNORMAL HIGH (ref 8–23)
CO2: 27 mmol/L (ref 22–32)
Calcium: 8.8 mg/dL — ABNORMAL LOW (ref 8.9–10.3)
Chloride: 100 mmol/L (ref 98–111)
Creatinine, Ser: 1.44 mg/dL — ABNORMAL HIGH (ref 0.61–1.24)
GFR, Estimated: 49 mL/min — ABNORMAL LOW (ref >60)
Glucose, Bld: 140 mg/dL — ABNORMAL HIGH (ref 70–99)
Potassium: 5.2 mmol/L — ABNORMAL HIGH (ref 3.5–5.1)
Sodium: 135 mmol/L (ref 135–145)

## 2020-03-25 LAB — HEMOGLOBIN AND HEMATOCRIT, BLOOD
HCT: 27.7 % — ABNORMAL LOW (ref 39.0–52.0)
HCT: 28.6 % — ABNORMAL LOW (ref 39.0–52.0)
HCT: 27.1 % — ABNORMAL LOW (ref 39.0–52.0)
Hemoglobin: 9.2 g/dL — ABNORMAL LOW (ref 13.0–17.0)
Hemoglobin: 9.3 g/dL — ABNORMAL LOW (ref 13.0–17.0)
Hemoglobin: 8.8 g/dL — ABNORMAL LOW (ref 13.0–17.0)

## 2020-03-25 LAB — PROTIME-INR
INR: 2.3 — ABNORMAL HIGH (ref 0.8–1.2)
Prothrombin Time: 24.6 seconds — ABNORMAL HIGH (ref 11.4–15.2)

## 2020-03-25 LAB — PREPARE RBC (CROSSMATCH)

## 2020-03-25 SURGERY — EGD (ESOPHAGOGASTRODUODENOSCOPY)
Anesthesia: Monitor Anesthesia Care

## 2020-03-25 MED ORDER — SODIUM CHLORIDE 0.9% IV SOLUTION: Freq: Once | INTRAVENOUS | Status: AC

## 2020-03-25 MED ORDER — HYDRALAZINE HCL 25 MG PO TABS
37.5000 mg | ORAL_TABLET | Freq: Three times a day (TID) | ORAL | Status: DC
  Administered 2020-03-25 – 2020-04-05 (×28): 37.5 mg via ORAL
  Filled 2020-03-25 (×28): qty 2

## 2020-03-25 MED ORDER — BOOST / RESOURCE BREEZE PO LIQD CUSTOM
1.0000 | Freq: Three times a day (TID) | ORAL | Status: DC
  Administered 2020-03-25 – 2020-03-29 (×8): 1 via ORAL

## 2020-03-25 MED ORDER — SODIUM ZIRCONIUM CYCLOSILICATE 5 G PO PACK
5.0000 g | PACK | Freq: Once | ORAL | Status: AC
  Administered 2020-03-25: 5 g via ORAL
  Filled 2020-03-25: qty 1

## 2020-03-25 NOTE — Progress Notes (Addendum)
Progress Note  Patient Name: Isaiah [name] Date of Encounter: 03/25/2020  [facility] Cardiologist: Quay Burow, MD   Subjective   No chest pain. Breathing stable.   Inpatient Medications    Scheduled Meds: . sodium chloride   Intravenous Once  . acetaminophen (TYLENOL) oral liquid 160 mg/5 mL  650 mg Oral Q4H  . acetaminophen  650 mg Rectal Q4H  . allopurinol  100 mg Oral BID  . amoxicillin-clavulanate  1 tablet Oral Q12H  . chlorhexidine gluconate (MEDLINE KIT)  15 mL Mouth Rinse BID  . docusate  100 mg Oral BID  . feeding supplement  1 Container Oral TID BM  . ferrous sulfate  325 mg Oral Q breakfast  . gabapentin  300 mg Oral Q12H  . hydrALAZINE  25 mg Oral Q8H  . insulin aspart  0-20 Units Subcutaneous Q4H  . insulin glargine  20 Units Subcutaneous Daily  . isosorbide mononitrate  30 mg Oral Daily  . mouth rinse  15 mL Mouth Rinse BID  . pantoprazole (PROTONIX) IV  40 mg Intravenous Q12H  . QUEtiapine  50 mg Oral Q supper  . rosuvastatin  10 mg Oral QHS  . sodium chloride flush  3 mL Intravenous Q12H  . sodium chloride flush  5 mL Intracatheter Q8H  . tamsulosin  0.4 mg Oral QPC supper   Continuous Infusions: . sodium chloride Stopped (03/17/20 2016)   PRN Meds: Gerhardt's butt cream, guaiFENesin-dextromethorphan, hydrALAZINE, loperamide, ondansetron **OR** ondansetron (ZOFRAN) IV, traMADol   Vital Signs    Vitals:   03/25/20 0045 03/25/20 0100 03/25/20 0313 03/25/20 0730  BP: (!) 123/50 (!) 129/51 118/72 (!) 140/55  Pulse:    70  Resp: '20  20 20  ' Temp: 98.1 F (36.7 C) 98.2 F (36.8 C) 97.6 F (36.4 C) 97.7 F (36.5 C)  TempSrc: Oral Oral Axillary Oral  SpO2: 99% 98% 98% 98%  Weight:      Height:        Intake/Output Summary (Last 24 hours) at 03/25/2020 1041 Last data filed at 03/25/2020 2951 Gross per 24 hour  Intake 1640.3 ml  Output 1917 ml  Net -276.7 ml   Last 3 Weights 03/24/2020 03/21/2020 03/18/2020  Weight (lbs) 216 lb 14.9  oz 225 lb 5 oz 217 lb 2.5 oz  Weight (kg) 98.4 kg 102.2 kg 98.5 kg      Telemetry    Atrial fibrillation / junction rhythm, rare PVCs  - Personally Reviewed  ECG    N/A  Physical Exam   GEN: ill appearing male in no acute distress.   Neck: No JVD Cardiac: RRR, no murmurs, rubs, or gallops.  Respiratory: Clear to auscultation bilaterally. GI: Soft, nontender, non-distended  MS: No edema; No deformity. Neuro:  Nonfocal  Psych: Normal affect   Labs    High Sensitivity Troponin:   Recent Labs  Lab 03/15/20 1705 03/15/20 1916 03/16/20 0008 03/16/20 0309  TROPONINIHS 61* 308* 982* 1,364*      Chemistry Recent Labs  Lab 03/23/20 0022 03/24/20 0028 03/25/20 0033  NA 135 134* 135  K 5.0 5.7* 5.2*  CL 99 100 100  CO2 '30 26 27  ' GLUCOSE 147* 138* 140*  BUN 53* 58* 61*  CREATININE 1.25* 1.21 1.44*  CALCIUM 8.8* 8.9 8.8*  PROT  --  6.0*  --   ALBUMIN  --  2.0*  --   AST  --  22  --   ALT  --  30  --  ALKPHOS  --  73  --   BILITOT  --  0.5  --   GFRNONAA 59* >60 49*  ANIONGAP '6 8 8     ' Hematology Recent Labs  Lab 03/23/20 0022 03/23/20 0022 03/24/20 0028 03/24/20 0543 03/24/20 2322 03/25/20 0033 03/25/20 0632  WBC 12.3*  --  13.1*  --   --  12.7*  --   RBC 2.32*  --  1.96*  --   --  2.24*  --   HGB 7.8*   < > 6.5*   < > 7.4* 7.6* 9.2*  HCT 25.1*   < > 21.3*   < > 22.9* 23.2* 27.7*  MCV 108.2*  --  108.7*  --   --  103.6*  --   MCH 33.6  --  33.2  --   --  33.9  --   MCHC 31.1  --  30.5  --   --  32.8  --   RDW 14.1  --  14.3  --   --  17.0*  --   PLT 257  --  275  --   --  254  --    < > = values in this interval not displayed.    BNP Recent Labs  Lab 03/22/20 0117  BNP 441.2*     Radiology    No results found.  Cardiac Studies   ECHO: 03/16/20 IMPRESSIONS 1. Severe biventricular dysfunction with at least mild-moderate secondary  MR and TR.  2. Left ventricular ejection fraction, by estimation, is 20%. The left  ventricle has  severely decreased function. The left ventricle demonstrates  global hypokinesis. There is mild left ventricular hypertrophy. Left  ventricular diastolic parameters are  indeterminate. There is the interventricular septum is flattened in  systole and diastole, consistent with right ventricular pressure and  volume overload.  3. Right ventricular systolic function is severely reduced. The right  ventricular size is severely enlarged.  4. Left atrial size was mild to moderately dilated.  5. Right atrial size was severely dilated.  6. The mitral valve is normal in structure, and demonstrates tenting due  to LV dysfunction. Mild to moderate mitral valve regurgitation. No  evidence of mitral stenosis.  7. Tricuspid valve regurgitation is mild to moderate.  8. The aortic valve is grossly normal. There is mild calcification of the  aortic valve. Aortic valve regurgitation is trivial. No aortic stenosis is  present.   Conclusion(s)/Recommendation(s): No definite left ventricular mural or  apical thrombus/thrombi. Swirling apical contrast.   Patient Profile     79 y.o. male with acute cholecystitis and PEA arrest, noted to have severe LV dysfunction post-arrest and also elevated troponin consistent with demand ischemia of the myocardium.   Assessment & Plan    1. Acute on Chronic combined HF with associated cardiogenic shock:  - Required pressors, intubation. Now improved, extubated on 11/4. - Echo with severe biventricular dysfunction, EF of 20% with severely dilated right atrium, and moderately dilated left atrium. HF therapies have been very limited with AKI and significant bradycardia. He has been diuresed with IV lasix.  I/O since admission negative 3.1L.  - Tolerating Imdur/hydralazine - No ACE/ARB 2nd to AKI - No BB due to intermittent bradycardia with junction rhythm at baseline Hr currently in 60-70s  2. Elevated troponin with hx of CAD s/p CABG -  Peak troponin 1364. He is  not a good candidate for invasive therapy with his co-morbid conditions.  Continue with medical therapy.  3.  Anemia - S/p 2 unit of PRBCS - Hgb improved to 9.2 today  - Seen by GI yesterday and recommended supportive management  4. Chronic afib - Given anemia his coumadin has been held    For questions or updates, please contact [place] Please consult [email or web address] for contact info under        SignedLeanor Kail, PA  03/25/2020, 10:41 AM     Patient seen and examined. Agree with assessment and plan. More alert today.  Received 2 units of packed red blood cells yesterday.  Hemoglobin improved from 6.5 to 9.2 today.  Creatinine slightly increased from 1.21-1.44 today.  Blood pressure is elevated.  With biventricular heart failure he has tolerated the initiation of nitrate/hydralazine regimen particularly with his renal disease.  Will now further titrate hydralazine to 37.5 mg every 8 hours.  Renal function has significantly improved with a peak creatinine of 2.51 which had decreased to 1.21 but today has increased slightly to 1.44.  A. fib is well controlled with rates in the 60s.  Anticoagulation currently on hold with his GI blood loss and significant anemia.  INR today 2.3.  No chest pain.  Plan initial supportive management with PPI and serial CBCs in follow-up of his GI blood loss.   Troy Sine, MD, [facility] 03/25/2020 2:20 PM

## 2020-03-25 NOTE — Progress Notes (Signed)
PROGRESS NOTE    Isaiah [name]  BDZ:[id number] DOB: [birth year] DOA: 03/14/2020 PCP: [name], [name], MD    Brief Narrative:  Isaiah [name] is a 79 year old male with past medical history notable for peripheral vascular disease, CAD, essential hypertension, hyperlipidemia, type 2 diabetes mellitus, atrial fibrillation on Coumadin, chronic systolic congestive heart failure who presented to the ED with weakness and a pressure ulcer on the buttock.  Patient also complaining of right upper quadrant pain.  In the ED, patient noted to be febrile with a leukocytosis.  Elevated creatinine.  CT abdomen/pelvis concerning for acute cholecystitis.  Patient was started on broad-spectrum antibiotics and IV fluids.  General surgery was consulted.  Hospital service consulted for admission for severe sepsis secondary to acute cholecystitis.   Assessment & Plan:   Principal Problem:   Sepsis due to undetermined organism [facility]) Active Problems:   Coronary artery disease   Chronic atrial fibrillation (HCC)   Essential hypertension   Hyperlipidemia   Type 2 diabetes mellitus with vascular disease (HCC)   Obstructive sleep apnea   CKD (chronic kidney disease), stage III ([place])   ARF (acute renal failure) (HCC)   Pressure injury of skin   Acute cholecystitis   Class 1 obesity due to excess calories with body mass index (BMI) of 33.0 to 33.9 in adult   Shock [facility])   Cardiac arrest ([place])   Acute systolic heart failure (HCC)   Severe sepsis with septic shock Patient presenting to the ED with fever, leukocytosis, renal failure and encephalopathy.  Shortly following admission, patient became hypotensive despite aggressive IV fluid resuscitation and was transferred to the ICU service for pressor support.  Patient was able to be weaned from vasopressors; and was transreferred back to the hospitalist service on 03/19/2020.  Acute cholecystitis Patient presenting to ED with right upper quadrant pain.  Imaging  notable for distended gallbladder with layering gallstone and mild pericholestatic stranding.  Given severity of illness requiring ICU level of care and pressure support, patient underwent percutaneous cholecystostomy by IR on 03/16/2020. --WBC --IV zosyn transitioned to Augmentin; plan to complete 14 day course (End: 11/15) --Continue cholecystostomy to gravity, flush 58m TID; 636mout past 24h  Acute on chronic combined systolic/diastolic congestive heart failure; decompensated with associated cardiogenic shock Patient presenting with septic shock as above with associated cholecystitis with likely underlying cardiogenic shock requiring vasopressors and ventilatory support, extubated on 03/17/2020 and weaned off vasopressors.  TTE with severe biventricular dysfunction with LVEF 20% with severely dilated RA, moderately dilated LA. --Cardiology following, appreciate assistance --net negative [id number] 24hrs and net negative 3.2L since admission --continue isosorbide mononitrate 30 mg p.o. daily --Hydralazine 25 mg q8h --avoiding ACEi/ARB 2/2 AKI, and BB 2/2 bradycardia  Macrocytic Anemia/iron deficiency:  Concern for UGIB --Iron 42, TIBC 253, Ferritin 118, foalte 16.6, B12 888, FOBT +  --Hgb 13.9>>>10.5>9.3>9.4>7.8>6.4>7.6>9.2 --s/p IV Feraheme 03/22/2020; continue home ferrous sulfate 325 PO daily --Protonix 40 mg IV q12h --transfused 2u pRBC 11/11; 1u pRBC 11/12 --[facility] consulted, supportive care for now given his significant debility  --Clear liquid diet --Continue H&H every 6 hours; transfuse to maintain hemoglobin greater than 8  Hyperkalemia Potassium 5.2, will give dose of Lokelma 5 g p.o. today --Continue monitor on telemetry --Repeat electrolytes in a.m.  Acute renal failure on CKD stage IIIb: Presented with creatinine 3.46 with baseline roughly 1.6. etiology likely secondary to prerenal failure from dehydration, sepsis, and shock as above.  Now appears back to baseline. --Cr  3.45>>>1.25>1.21>1.44 --  Avoid nephrotoxins, renally dose all medications --Closely monitor renal function daily.  Chronic permanent atrial fibrillation Currently rate controlled.  On Coumadin for anticoagulation at home.  Was also on IV heparin for NSTEMI during hospitalization.  Hospital course remarkable for bradycardia cardiac episodes and amiodarone was discontinued.  Not on beta-blockers. --INR 2.3 today; continue to hold Coumadin 2/2 UGIB  Acute metabolic encephalopathy: Secondary to sepsis.  Currently alert and oriented.He is hard of hearing  Cough:  Most likely secondary to congestive heart failure.  No signs of pneumonia on chest xray.  Cough has improved.  Type II NSTEMI/elevated troponin:  Supply demand ischemia from sepsis/septic shock.  Cardiology were consulted with no plan for any intervention.  Urinary retention: resolved. Continue intermittent straight cath if needed. Started on flomax  Coronary artery disease:  No complaint of chest pain.  Continue Imdur.  Follow-up with cardiology as an outpatient. Status post CABG and PCI with stents.   --Holding coumadin as above  Hypertension:  Currently blood pressure stable.  Continue hydralazine, Imdur, aspirin and statin  Hyperlipidemia: On Crestor  Diabetes type 2:  Poorly controlled, hemoglobin A1c 12.1.  Diabetic coordinator was following. --Lantus 20 units [place] daily --Resistant NovoLog insulin sliding scale for coverage --CBGs qAC/HS  OSA: On CPAP at night  Obesity: BMI of 33.  Counseled on need for weight loss/lifestyle changes this complicates all facets of care.  Debility/deconditioning/memory deficits:  PT/OT consulted skilled nursing facility on discharge.  Patient lives with his daughter.  On Seroquel at home. Daughter Butch Penny wants to take him home with home health.  Stage II pressure injury, buttocks; POA Pressure Injury 03/14/20 Buttocks Right Stage 2 -  Partial thickness loss of dermis  presenting as a shallow open injury with a red, pink wound bed without slough. (Active)  03/14/20 2302  Location: Buttocks  Location Orientation: Right  Staging: Stage 2 -  Partial thickness loss of dermis presenting as a shallow open injury with a red, pink wound bed without slough.  Wound Description (Comments):   Present on Admission: Yes     Pressure Injury 03/14/20 Buttocks Left Stage 2 -  Partial thickness loss of dermis presenting as a shallow open injury with a red, pink wound bed without slough. (Active)  03/14/20 2300  Location: Buttocks  Location Orientation: Left  Staging: Stage 2 -  Partial thickness loss of dermis presenting as a shallow open injury with a red, pink wound bed without slough.  Wound Description (Comments):   Present on Admission: Yes  Wound care consulted and were following.   Continue local wound care, offloading.   DVT prophylaxis: SCDs Code Status: Full code Family Communication: None present at bedside, attempted to update patient's daughter Butch Penny via telephone this morning; unsuccessful.  Disposition Plan:  Status is: Inpatient  Remains inpatient appropriate because:Unsafe d/c plan, IV treatments appropriate due to intensity of illness or inability to take PO and Inpatient level of care appropriate due to severity of illness   Dispo: The patient is from: Home              Anticipated d/c is to: Home              Anticipated d/c date is: 2 days              Patient currently is not medically stable to d/c. Continues with dark tarry stools, needs further monitoring of hemoglobin as GI not planning any intervention as of yet.   Consultants:   PCCM  Interventional radiology  General surgery  Cardiology  [facility] gastroenterology, Dr. Paulita Fujita  Procedures:   IR placement cholecystostomy on 03/17/2020  Antimicrobials:   Zosyn 11/2 - 11/5  Augmentin 11/5>> (end 11/15)  Metronidazole 11/1 - 11/2  Cefepime 11/2 -11/2  Ceftriaxone 11/2 -  11/2   Subjective: Patient seen and examined at bedside, resting comfortably. Continues with fatigue and weakness. Transfused 3 units PRBCs over the past 24 hours with hemoglobin currently 9.4. Currently having bowel movement which is dark and tarry. GI consulted yesterday with recommendations supportive care and no intervention currently due to his severe debility. Patient with no other complaints or concerns at this time. Denies headache, no dizziness, no chest pain, no palpitations, no shortness of breath, no abdominal pain.  No acute events overnight per nursing staff.  Objective: Vitals:   03/25/20 0045 03/25/20 0100 03/25/20 0313 03/25/20 0730  BP: (!) 123/50 (!) 129/51 118/72 (!) 140/55  Pulse:    70  Resp: _0 Temp: 98.1 F (36.7 C) 98.2 F (36.8 C) 97.6 F (36.4 C) 97.7 F (36.5 C)  TempSrc: Oral Oral Axillary Oral  SpO2: 99% 98% 98% 98%  Weight:      Height:        Intake/Output Summary (Last 24 hours) at 03/25/2020 1123 Last data filed at 03/25/2020 0648 Gross per 24 hour  Intake 1640.3 ml  Output 1917 ml  Net -276.7 ml   Filed Weights   03/18/20 0400 03/21/20 1217 03/24/20 0500  Weight: 98.5 kg 102.2 kg 98.4 kg    Examination:  General exam: Appears calm and comfortable, chronically ill in appearance Respiratory system: Clear to auscultation. Respiratory effort normal.  Oxygenating well on room air Cardiovascular system: S1 & S2 heard, RRR. No JVD, murmurs, rubs, gallops or clicks. No pedal edema. Gastrointestinal system: Abdomen is nondistended, soft, mild tenderness noted surrounding cholecystostomy site with dressing clean/dry/intact; but do note bloody drainage in cholecystostomy bag. No organomegaly or masses felt. Normal bowel sounds heard. Central nervous system: Alert and oriented. No focal neurological deficits Extremities: Symmetric 5 x 5 power. Skin: No rashes, lesions or ulcers Psychiatry: Judgement and insight appear poor. Mood & affect  appropriate.     Data Reviewed: I have personally reviewed following labs and imaging studies  CBC: Recent Labs  Lab 03/21/20 0023 03/21/20 0023 03/22/20 0117 03/22/20 0117 03/23/20 0022 03/23/20 0022 03/24/20 0028 03/24/20 0543 03/24/20 2322 03/25/20 0033 03/25/20 0632  WBC 10.4  --  11.3*  --  12.3*  --  13.1*  --   --  12.7*  --   NEUTROABS  --   --   --   --  9.3*  --   --   --   --   --   --   HGB 9.3*   < > 9.4*   < > 7.8*   < > 6.5* 6.4* 7.4* 7.6* 9.2*  HCT 29.2*   < > 30.0*   < > 25.1*   < > 21.3* 20.7* 22.9* 23.2* 27.7*  MCV 109.0*  --  108.3*  --  108.2*  --  108.7*  --   --  103.6*  --   PLT 210  --  269  --  257  --  275  --   --  254  --    < > = values in this interval not displayed.   Basic Metabolic Panel: Recent Labs  Lab 03/21/20 0023 03/22/20 0117 03/23/20 0022 03/24/20 0028  03/25/20 0033  NA 137 134* 135 134* 135  K 4.1 4.7 5.0 5.7* 5.2*  CL 99 96* 99 100 100  CO2 _0 GLUCOSE 164* 173* 147* 138* 140*  BUN 52* 53* 53* 58* 61*  CREATININE 1.60* 1.38* 1.25* 1.21 1.44*  CALCIUM 8.5* 8.5* 8.8* 8.9 8.8*   GFR: Estimated Creatinine Clearance: 48.1 mL/min (A) (by C-G formula based on SCr of 1.44 mg/dL (H)). Liver Function Tests: Recent Labs  Lab 03/24/20 0028  AST 22  ALT 30  ALKPHOS 73  BILITOT 0.5  PROT 6.0*  ALBUMIN 2.0*   No results for input(s): LIPASE, AMYLASE in the last 168 hours. No results for input(s): AMMONIA in the last 168 hours. Coagulation Profile: Recent Labs  Lab 03/21/20 0023 03/22/20 0117 03/23/20 0022 03/24/20 0028 03/25/20 0033  INR 3.6* 2.7* 2.7* 2.5* 2.3*   Cardiac Enzymes: No results for input(s): CKTOTAL, CKMB, CKMBINDEX, TROPONINI in the last 168 hours. BNP (last 3 results) No results for input(s): PROBNP in the last 8760 hours. HbA1C: No results for input(s): HGBA1C in the last 72 hours. CBG: Recent Labs  Lab 03/25/20 0009 03/25/20 0112 03/25/20 0321 03/25/20 0824 03/25/20 1118   GLUCAP 131* 137* 147* 185* 223*   Lipid Profile: Recent Labs    03/24/20 0028  CHOL 64  HDL 15*  LDLCALC 18  TRIG 154*  CHOLHDL 4.3   Thyroid Function Tests: No results for input(s): TSH, T4TOTAL, FREET4, T3FREE, THYROIDAB in the last 72 hours. Anemia Panel: Recent Labs    03/23/20 1244  VITAMINB12 888   Sepsis Labs: No results for input(s): PROCALCITON, LATICACIDVEN in the last 168 hours.  Recent Results (from the past 240 hour(s))  Aerobic/Anaerobic Culture (surgical/deep wound)     Status: None   Collection Time: 03/16/20  5:13 PM   Specimen: PATH Other; Bile  Result Value Ref Range Status   Specimen Description BILE  Final   Special Requests NONE  Final   Gram Stain   Final    FEW WBC PRESENT,BOTH PMN AND MONONUCLEAR ABUNDANT GRAM POSITIVE COCCI    Culture   Final    ABUNDANT ENTEROCOCCUS FAECIUM NO ANAEROBES ISOLATED Performed at [facility], [address] [place], [place] [address]    Report Status 03/21/2020 FINAL  Final   Organism ID, Bacteria ENTEROCOCCUS FAECIUM  Final      Susceptibility   Enterococcus faecium - MIC*    AMPICILLIN <=2 SENSITIVE Sensitive     VANCOMYCIN <=0.5 SENSITIVE Sensitive     GENTAMICIN SYNERGY SENSITIVE Sensitive     * ABUNDANT ENTEROCOCCUS FAECIUM         Radiology Studies: No results found.      Scheduled Meds: . sodium chloride   Intravenous Once  . acetaminophen (TYLENOL) oral liquid 160 mg/5 mL  650 mg Oral Q4H  . acetaminophen  650 mg Rectal Q4H  . allopurinol  100 mg Oral BID  . amoxicillin-clavulanate  1 tablet Oral Q12H  . chlorhexidine gluconate (MEDLINE KIT)  15 mL Mouth Rinse BID  . docusate  100 mg Oral BID  . feeding supplement  1 Container Oral TID BM  . ferrous sulfate  325 mg Oral Q breakfast  . gabapentin  300 mg Oral Q12H  . hydrALAZINE  25 mg Oral Q8H  . insulin aspart  0-20 Units Subcutaneous Q4H  . insulin glargine  20 Units Subcutaneous Daily  . isosorbide mononitrate  30 mg  Oral Daily  .  mouth rinse  15 mL Mouth Rinse BID  . pantoprazole (PROTONIX) IV  40 mg Intravenous Q12H  . QUEtiapine  50 mg Oral Q supper  . rosuvastatin  10 mg Oral QHS  . sodium chloride flush  3 mL Intravenous Q12H  . sodium chloride flush  5 mL Intracatheter Q8H  . tamsulosin  0.4 mg Oral QPC supper   Continuous Infusions: . sodium chloride Stopped (03/17/20 2016)     LOS: 11 days    Time spent: 38 minutes spent on chart review, discussion with nursing staff, consultants, updating family and interview/physical exam; more than 50% of that time was spent in counseling and/or coordination of care.    Eric J [place] DO [facility] Available via Epic secure chat 7am-7pm After these hours, please refer to coverage provider listed on [email or web address] 03/25/2020, 11:23 AM

## 2020-03-25 NOTE — Plan of Care (Signed)
  Problem: Education: Goal: Knowledge of General Education information will improve Description Including pain rating scale, medication(s)/side effects and non-pharmacologic comfort measures Outcome: Progressing   

## 2020-03-26 ENCOUNTER — Inpatient Hospital Stay (HOSPITAL_COMMUNITY): Payer: Medicare Other

## 2020-03-26 DIAGNOSIS — Z6833 Body mass index (BMI) 33.0-33.9, adult: Secondary | ICD-10-CM

## 2020-03-26 DIAGNOSIS — I5021 Acute systolic (congestive) heart failure: Secondary | ICD-10-CM | POA: Diagnosis not present

## 2020-03-26 DIAGNOSIS — E6609 Other obesity due to excess calories: Secondary | ICD-10-CM

## 2020-03-26 DIAGNOSIS — A419 Sepsis, unspecified organism: Secondary | ICD-10-CM | POA: Diagnosis not present

## 2020-03-26 DIAGNOSIS — E7800 Pure hypercholesterolemia, unspecified: Secondary | ICD-10-CM

## 2020-03-26 DIAGNOSIS — N179 Acute kidney failure, unspecified: Secondary | ICD-10-CM | POA: Diagnosis not present

## 2020-03-26 DIAGNOSIS — I2510 Atherosclerotic heart disease of native coronary artery without angina pectoris: Secondary | ICD-10-CM

## 2020-03-26 DIAGNOSIS — R6520 Severe sepsis without septic shock: Secondary | ICD-10-CM

## 2020-03-26 DIAGNOSIS — K810 Acute cholecystitis: Secondary | ICD-10-CM | POA: Diagnosis not present

## 2020-03-26 LAB — TYPE AND SCREEN
ABO/RH(D): A POS
Antibody Screen: NEGATIVE
Unit division: 0
Unit division: 0
Unit division: 0

## 2020-03-26 LAB — ECHOCARDIOGRAM LIMITED
Area-P 1/2: 4.8 cm2
Height: 69 in
S' Lateral: 3.85 cm
Weight: 3470.92 oz

## 2020-03-26 LAB — BASIC METABOLIC PANEL
Anion gap: 8 (ref 5–15)
BUN: 53 mg/dL — ABNORMAL HIGH (ref 8–23)
CO2: 28 mmol/L (ref 22–32)
Calcium: 8.7 mg/dL — ABNORMAL LOW (ref 8.9–10.3)
Chloride: 99 mmol/L (ref 98–111)
Creatinine, Ser: 1.34 mg/dL — ABNORMAL HIGH (ref 0.61–1.24)
GFR, Estimated: 54 mL/min — ABNORMAL LOW (ref >60)
Glucose, Bld: 94 mg/dL (ref 70–99)
Potassium: 4.6 mmol/L (ref 3.5–5.1)
Sodium: 135 mmol/L (ref 135–145)

## 2020-03-26 LAB — BPAM RBC
Blood Product Expiration Date: 202111242359
Blood Product Expiration Date: 202111242359
Blood Product Expiration Date: 202111262359
ISSUE DATE / TIME: 202111111011
ISSUE DATE / TIME: 202111111449
ISSUE DATE / TIME: 202111120034
Unit Type and Rh: 6200
Unit Type and Rh: 6200
Unit Type and Rh: 6200

## 2020-03-26 LAB — GLUCOSE, CAPILLARY
Glucose-Capillary: 109 mg/dL — ABNORMAL HIGH (ref 70–99)
Glucose-Capillary: 163 mg/dL — ABNORMAL HIGH (ref 70–99)
Glucose-Capillary: 191 mg/dL — ABNORMAL HIGH (ref 70–99)
Glucose-Capillary: 173 mg/dL — ABNORMAL HIGH (ref 70–99)
Glucose-Capillary: 198 mg/dL — ABNORMAL HIGH (ref 70–99)
Glucose-Capillary: 142 mg/dL — ABNORMAL HIGH (ref 70–99)

## 2020-03-26 LAB — CBC
HCT: 26.4 % — ABNORMAL LOW (ref 39.0–52.0)
Hemoglobin: 8.6 g/dL — ABNORMAL LOW (ref 13.0–17.0)
MCH: 33.2 pg (ref 26.0–34.0)
MCHC: 32.6 g/dL (ref 30.0–36.0)
MCV: 101.9 fL — ABNORMAL HIGH (ref 80.0–100.0)
Platelets: 250 10*3/uL (ref 150–400)
RBC: 2.59 MIL/uL — ABNORMAL LOW (ref 4.22–5.81)
RDW: 18.6 % — ABNORMAL HIGH (ref 11.5–15.5)
WBC: 10.6 10*3/uL — ABNORMAL HIGH (ref 4.0–10.5)
nRBC: 0.8 % — ABNORMAL HIGH (ref 0.0–0.2)

## 2020-03-26 LAB — HEMOGLOBIN AND HEMATOCRIT, BLOOD
HCT: 29.1 % — ABNORMAL LOW (ref 39.0–52.0)
Hemoglobin: 9.1 g/dL — ABNORMAL LOW (ref 13.0–17.0)

## 2020-03-26 LAB — PROTIME-INR
INR: 1.7 — ABNORMAL HIGH (ref 0.8–1.2)
Prothrombin Time: 19 seconds — ABNORMAL HIGH (ref 11.4–15.2)

## 2020-03-26 MED ORDER — PERFLUTREN LIPID MICROSPHERE
1.0000 mL | INTRAVENOUS | Status: AC | PRN
  Administered 2020-03-26: 2 mL via INTRAVENOUS
  Filled 2020-03-26: qty 10

## 2020-03-26 MED ORDER — ALUM & MAG HYDROXIDE-SIMETH 200-200-20 MG/5ML PO SUSP
15.0000 mL | ORAL | Status: DC | PRN
  Administered 2020-03-26 – 2020-04-01 (×9): 15 mL via ORAL
  Filled 2020-03-26 (×10): qty 30

## 2020-03-26 MED ORDER — SUCRALFATE 1 GM/10ML PO SUSP
1.0000 g | Freq: Three times a day (TID) | ORAL | Status: DC
  Administered 2020-03-26 – 2020-04-05 (×36): 1 g via ORAL
  Filled 2020-03-26 (×36): qty 10

## 2020-03-26 MED ORDER — DOCUSATE SODIUM 50 MG/5ML PO LIQD: 100.0000 mg | Freq: Two times a day (BID) | ORAL | Status: DC | PRN

## 2020-03-26 NOTE — Plan of Care (Signed)
  Problem: Education: Goal: Knowledge of General Education information will improve Description: Including pain rating scale, medication(s)/side effects and non-pharmacologic comfort measures Outcome: Progressing   Problem: Health Behavior/Discharge Planning: Goal: Ability to manage health-related needs will improve Outcome: Progressing   Problem: Clinical Measurements: Goal: Ability to maintain clinical measurements within normal limits will improve Outcome: Progressing Goal: Will remain free from infection Outcome: Progressing Goal: Diagnostic test results will improve Outcome: Progressing Goal: Respiratory complications will improve Outcome: Progressing Goal: Cardiovascular complication will be avoided Outcome: Progressing   Problem: Activity: Goal: Risk for activity intolerance will decrease Outcome: Progressing   Problem: Nutrition: Goal: Adequate nutrition will be maintained Outcome: Progressing   Problem: Coping: Goal: Level of anxiety will decrease Outcome: Progressing   Problem: Elimination: Goal: Will not experience complications related to bowel motility Outcome: Progressing Goal: Will not experience complications related to urinary retention Outcome: Progressing   Problem: Pain Managment: Goal: General experience of comfort will improve Outcome: Progressing   Problem: Safety: Goal: Ability to remain free from injury will improve Outcome: Progressing   Problem: Skin Integrity: Goal: Risk for impaired skin integrity will decrease Outcome: Progressing   Problem: Education: Goal: Knowledge of disease or condition will improve Outcome: Progressing Goal: Understanding of medication regimen will improve Outcome: Progressing Goal: Individualized Educational Video(s) Outcome: Progressing   Problem: Activity: Goal: Ability to tolerate increased activity will improve Outcome: Progressing   Problem: Cardiac: Goal: Ability to achieve and maintain  adequate cardiopulmonary perfusion will improve Outcome: Progressing   Problem: Health Behavior/Discharge Planning: Goal: Ability to safely manage health-related needs after discharge will improve Outcome: Progressing   Problem: Education: Goal: Knowledge of disease and its progression will improve Outcome: Progressing   Problem: Health Behavior/Discharge Planning: Goal: Ability to manage health-related needs will improve Outcome: Progressing   Problem: Clinical Measurements: Goal: Complications related to the disease process or treatment will be avoided or minimized Outcome: Progressing Goal: Dialysis access will remain free of complications Outcome: Progressing   Problem: Activity: Goal: Activity intolerance will improve Outcome: Progressing   Problem: Fluid Volume: Goal: Fluid volume balance will be maintained or improved Outcome: Progressing   Problem: Nutritional: Goal: Ability to make appropriate dietary choices will improve Outcome: Progressing   Problem: Respiratory: Goal: Respiratory symptoms related to disease process will be avoided Outcome: Progressing   Problem: Self-Concept: Goal: Body image disturbance will be avoided or minimized Outcome: Progressing   Problem: Urinary Elimination: Goal: Progression of disease will be identified and treated Outcome: Progressing   Problem: Fluid Volume: Goal: Hemodynamic stability will improve Outcome: Progressing   Problem: Clinical Measurements: Goal: Diagnostic test results will improve Outcome: Progressing Goal: Signs and symptoms of infection will decrease Outcome: Progressing   Problem: Respiratory: Goal: Ability to maintain adequate ventilation will improve Outcome: Progressing   

## 2020-03-26 NOTE — Progress Notes (Signed)
  Echocardiogram 2D Echocardiogram has been performed.  Pieter Partridge 03/26/2020, 2:57 PM

## 2020-03-26 NOTE — Progress Notes (Signed)
PROGRESS NOTE    SY Isaiah [name]  [name]:[id number] DOB: 01/24/1941 DOA: 03/14/2020 PCP: [name], [name], MD    Brief Narrative:  Isaiah [name] is a 79 year old male with past medical history notable for peripheral vascular disease, CAD, essential hypertension, hyperlipidemia, type 2 diabetes mellitus, atrial fibrillation on Coumadin, chronic systolic congestive heart failure who presented to the ED with weakness and a pressure ulcer on the buttock.  Patient also complaining of right upper quadrant pain.  In the ED, patient noted to be febrile with a leukocytosis.  Elevated creatinine.  CT abdomen/pelvis concerning for acute cholecystitis.  Patient was started on broad-spectrum antibiotics and IV fluids.  General surgery was consulted.  Hospital service consulted for admission for severe sepsis secondary to acute cholecystitis.   Assessment & Plan:   Principal Problem:   Sepsis due to undetermined organism [name]) Active Problems:   Coronary artery disease   Chronic atrial fibrillation (HCC)   Essential hypertension   Hyperlipidemia   Type 2 diabetes mellitus with vascular disease (HCC)   Obstructive sleep apnea   CKD (chronic kidney disease), stage III ([name])   ARF (acute renal failure) (HCC)   Pressure injury of skin   Acute cholecystitis   Class 1 obesity due to excess calories with body mass index (BMI) of 33.0 to 33.9 in adult   [facility])   Cardiac arrest ([name])   Acute systolic heart failure (HCC)   Biventricular failure ([name])   Anemia due to GI blood loss   Severe sepsis with septic shock Patient presenting to the ED with fever, leukocytosis, renal failure and encephalopathy.  Shortly following admission, patient became hypotensive despite aggressive IV fluid resuscitation and was transferred to the ICU service for pressor support.  Patient was able to be weaned from vasopressors; and was transreferred back to the hospitalist service on 03/19/2020.  Acute  cholecystitis Patient presenting to ED with right upper quadrant pain.  Imaging notable for distended gallbladder with layering gallstone and mild pericholestatic stranding.  Given severity of illness requiring ICU level of care and pressure support, patient underwent percutaneous cholecystostomy by IR on 03/16/2020. --WBC 21.2>>>12.7>10.6 --IV zosyn transitioned to Augmentin; plan to complete 14 day course (End: 11/15) --Continue cholecystostomy to gravity, flush 56m TID; 580mout past 24h  Acute on chronic combined systolic/diastolic congestive heart failure; decompensated with associated cardiogenic shock Patient presenting with septic shock as above with associated cholecystitis with likely underlying cardiogenic shock requiring vasopressors and ventilatory support, extubated on 03/17/2020 and weaned off vasopressors.  TTE with severe biventricular dysfunction with LVEF 20% with severely dilated RA, moderately dilated LA. --Cardiology following, appreciate assistance --net negative [id number] 24hrs and net negative 3.2L since admission --continue isosorbide mononitrate 30 mg p.o. daily --Hydralazine 37.5 mg q8h --avoiding ACEi/ARB 2/2 AKI, and BB 2/2 bradycardia  Macrocytic Anemia/iron deficiency:  Concern for UGIB --Iron 42, TIBC 253, Ferritin 118, foalte 16.6, B12 888, FOBT +  --Hgb 13.9>>>10.5>9.3>9.4>7.8>6.4>7.6>9.2>9.3>8.8>8.6 --s/p IV Feraheme 03/22/2020; continue home ferrous sulfate 325 PO daily --Protonix 40 mg IV q12h --Carafate 1 g p.o. TIDAC/HS --transfused 2u pRBC 11/11; 1u pRBC 11/12 --[facility] consulted, supportive care for now given his significant debility  --Advance to soft diet today --CBC daily; transfuse to maintain hemoglobin greater than 8  Hyperkalemia: Resolved Potassium 4.6 today, s/p 10g Lokelma on 11/11 and 5g Lokelma 11/12 --Continue monitor on telemetry --Repeat electrolytes in a.m.  Acute renal failure on CKD stage IIIb: Presented with creatinine 3.46  with baseline roughly 1.6. etiology  likely secondary to prerenal failure from dehydration, sepsis, and shock as above.  Now appears back to baseline. --Cr 3.45>>>1.25>1.21>1.44>1.34 --Avoid nephrotoxins, renally dose all medications --Closely monitor renal function daily.  Chronic permanent atrial fibrillation Currently rate controlled.  On Coumadin for anticoagulation at home.  Was also on IV heparin for NSTEMI during hospitalization.  Hospital course remarkable for bradycardia cardiac episodes and amiodarone was discontinued.  Not on beta-blockers. --INR 1.7 today; continue to hold Coumadin 2/2 UGIB, likely will continue to hold on discharge  Acute metabolic encephalopathy: Secondary to sepsis.  Currently alert and oriented.He is hard of hearing  Cough:  Most likely secondary to congestive heart failure.  No signs of pneumonia on chest xray.  Cough has improved.  Type II NSTEMI/elevated troponin:  Supply demand ischemia from sepsis/septic shock.  Cardiology were consulted with no plan for any intervention.  Urinary retention: resolved. Continue intermittent straight cath if needed. Started on flomax  Coronary artery disease:  No complaint of chest pain.  Continue Imdur.  Follow-up with cardiology as an outpatient. Status post CABG and PCI with stents.   --Holding coumadin as above  Hypertension:  Currently blood pressure stable.  Continue hydralazine, Imdur, aspirin and statin  Hyperlipidemia: On Crestor  Diabetes type 2:  Poorly controlled, hemoglobin A1c 12.1.  Diabetic coordinator was following. --Lantus 20 units Center daily --Resistant NovoLog insulin sliding scale for coverage --CBGs qAC/HS  OSA: On CPAP at night  Obesity: BMI of 33.  Counseled on need for weight loss/lifestyle changes this complicates all facets of care.  Debility/deconditioning/memory deficits:  PT/OT consulted skilled nursing facility on discharge.  Patient lives with his daughter.  On  Seroquel at home. Daughter Butch Penny wants to take him home with home health.  Stage II pressure injury, buttocks; POA Pressure Injury 03/14/20 Buttocks Right Stage 2 -  Partial thickness loss of dermis presenting as a shallow open injury with a red, pink wound bed without slough. (Active)  03/14/20 2302  Location: Buttocks  Location Orientation: Right  Staging: Stage 2 -  Partial thickness loss of dermis presenting as a shallow open injury with a red, pink wound bed without slough.  Wound Description (Comments):   Present on Admission: Yes     Pressure Injury 03/14/20 Buttocks Left Stage 2 -  Partial thickness loss of dermis presenting as a shallow open injury with a red, pink wound bed without slough. (Active)  03/14/20 2300  Location: Buttocks  Location Orientation: Left  Staging: Stage 2 -  Partial thickness loss of dermis presenting as a shallow open injury with a red, pink wound bed without slough.  Wound Description (Comments):   Present on Admission: Yes  Wound care consulted and were following.   Continue local wound care, offloading.   DVT prophylaxis: SCDs Code Status: Full code Family Communication: None present at bedside, attempted to update patient's daughter Butch Penny via telephone this morning; unsuccessful.  Disposition Plan:  Status is: Inpatient  Remains inpatient appropriate because:Unsafe d/c plan, IV treatments appropriate due to intensity of illness or inability to take PO and Inpatient level of care appropriate due to severity of illness   Dispo: The patient is from: Home              Anticipated d/c is to: Home              Anticipated d/c date is: 2 days              Patient currently is not medically stable  to d/c.    Consultants:   PCCM  Interventional radiology  General surgery  Cardiology  [facility] gastroenterology, Dr. Paulita Fujita  Procedures:   IR placement cholecystostomy on 03/17/2020  Antimicrobials:   Zosyn 11/2 - 11/5  Augmentin 11/5>>  (end 11/15)  Metronidazole 11/1 - 11/2  Cefepime 11/2 -11/2  Ceftriaxone 11/2 - 11/2   Subjective: Patient seen and examined at bedside, resting comfortably. Continues with fatigue and weakness; also indigestion.  Hemoglobin appears to have stabilized.  Will continue to hold Coumadin, likely indefinitely. Patient with no other complaints or concerns at this time. Denies headache, no dizziness, no chest pain, no palpitations, no shortness of breath, no abdominal pain.  No acute events overnight per nursing staff.  Objective: Vitals:   03/25/20 2218 03/25/20 2329 03/26/20 0351 03/26/20 0717  BP: (!) 118/51 (!) 127/58 (!) 136/59 (!) 116/94  Pulse: 66 67 70 69  Resp: '20 15 17 16  ' Temp:  98.1 F (36.7 C) 97.8 F (36.6 C) (!) 97 F (36.1 C)  TempSrc:  Axillary Axillary Axillary  SpO2: 95% 95% 97% 100%  Weight:      Height:        Intake/Output Summary (Last 24 hours) at 03/26/2020 1057 Last data filed at 03/26/2020 0900 Gross per 24 hour  Intake 125 ml  Output 1455 ml  Net -1330 ml   Filed Weights   03/18/20 0400 03/21/20 1217 03/24/20 0500  Weight: 98.5 kg 102.2 kg 98.4 kg    Examination:  General exam: Appears calm and comfortable, chronically ill in appearance Respiratory system: Clear to auscultation. Respiratory effort normal.  Oxygenating well on room air Cardiovascular system: S1 & S2 heard, RRR. No JVD, murmurs, rubs, gallops or clicks. No pedal edema. Gastrointestinal system: Abdomen is nondistended, soft, mild tenderness noted surrounding cholecystostomy site with dressing clean/dry/intact; but do note bloody drainage in cholecystostomy bag. No organomegaly or masses felt. Normal bowel sounds heard. Central nervous system: Alert and oriented. No focal neurological deficits Extremities: Symmetric 5 x 5 power. Skin: No rashes, lesions or ulcers Psychiatry: Judgement and insight appear poor. Mood & affect appropriate.     Data Reviewed: I have personally reviewed  following labs and imaging studies  CBC: Recent Labs  Lab 03/22/20 0117 03/22/20 0117 03/23/20 0022 03/23/20 0022 03/24/20 0028 03/24/20 0543 03/25/20 0033 03/25/20 4401 03/25/20 1213 03/25/20 1742 03/26/20 0057  WBC 11.3*  --  12.3*  --  13.1*  --  12.7*  --   --   --  10.6*  NEUTROABS  --   --  9.3*  --   --   --   --   --   --   --   --   HGB 9.4*   < > 7.8*   < > 6.5*   < > 7.6* 9.2* 9.3* 8.8* 8.6*  HCT 30.0*   < > 25.1*   < > 21.3*   < > 23.2* 27.7* 28.6* 27.1* 26.4*  MCV 108.3*  --  108.2*  --  108.7*  --  103.6*  --   --   --  101.9*  PLT 269  --  257  --  275  --  254  --   --   --  250   < > = values in this interval not displayed.   Basic Metabolic Panel: Recent Labs  Lab 03/22/20 0117 03/23/20 0022 03/24/20 0028 03/25/20 0033 03/26/20 0057  NA 134* 135 134* 135 135  K 4.7 5.0  5.7* 5.2* 4.6  CL 96* 99 100 100 99  CO2 '30 30 26 27 28  ' GLUCOSE 173* 147* 138* 140* 94  BUN 53* 53* 58* 61* 53*  CREATININE 1.38* 1.25* 1.21 1.44* 1.34*  CALCIUM 8.5* 8.8* 8.9 8.8* 8.7*   GFR: Estimated Creatinine Clearance: 51.7 mL/min (A) (by C-G formula based on SCr of 1.34 mg/dL (H)). Liver Function Tests: Recent Labs  Lab 03/24/20 0028  AST 22  ALT 30  ALKPHOS 73  BILITOT 0.5  PROT 6.0*  ALBUMIN 2.0*   No results for input(s): LIPASE, AMYLASE in the last 168 hours. No results for input(s): AMMONIA in the last 168 hours. Coagulation Profile: Recent Labs  Lab 03/22/20 0117 03/23/20 0022 03/24/20 0028 03/25/20 0033 03/26/20 0057  INR 2.7* 2.7* 2.5* 2.3* 1.7*   Cardiac Enzymes: No results for input(s): CKTOTAL, CKMB, CKMBINDEX, TROPONINI in the last 168 hours. BNP (last 3 results) No results for input(s): PROBNP in the last 8760 hours. HbA1C: No results for input(s): HGBA1C in the last 72 hours. CBG: Recent Labs  Lab 03/25/20 1616 03/25/20 2004 03/25/20 2324 03/26/20 0349 03/26/20 0740  GLUCAP 212* 148* 88 109* 163*   Lipid Profile: Recent Labs     03/24/20 0028  CHOL 64  HDL 15*  LDLCALC 18  TRIG 154*  CHOLHDL 4.3   Thyroid Function Tests: No results for input(s): TSH, T4TOTAL, FREET4, T3FREE, THYROIDAB in the last 72 hours. Anemia Panel: Recent Labs    03/23/20 1244  VITAMINB12 888   Sepsis Labs: No results for input(s): PROCALCITON, LATICACIDVEN in the last 168 hours.  Recent Results (from the past 240 hour(s))  Aerobic/Anaerobic Culture (surgical/deep wound)     Status: None   Collection Time: 03/16/20  5:13 PM   Specimen: PATH Other; Bile  Result Value Ref Range Status   Specimen Description BILE  Final   Special Requests NONE  Final   Gram Stain   Final    FEW WBC PRESENT,BOTH PMN AND MONONUCLEAR ABUNDANT GRAM POSITIVE COCCI    Culture   Final    ABUNDANT ENTEROCOCCUS FAECIUM NO ANAEROBES ISOLATED Performed at [facility], [address] [place], [place] [address]    Report Status 03/21/2020 FINAL  Final   Organism ID, Bacteria ENTEROCOCCUS FAECIUM  Final      Susceptibility   Enterococcus faecium - MIC*    AMPICILLIN <=2 SENSITIVE Sensitive     VANCOMYCIN <=0.5 SENSITIVE Sensitive     GENTAMICIN SYNERGY SENSITIVE Sensitive     * ABUNDANT ENTEROCOCCUS FAECIUM         Radiology Studies: No results found.      Scheduled Meds: . sodium chloride   Intravenous Once  . acetaminophen (TYLENOL) oral liquid 160 mg/5 mL  650 mg Oral Q4H  . acetaminophen  650 mg Rectal Q4H  . allopurinol  100 mg Oral BID  . amoxicillin-clavulanate  1 tablet Oral Q12H  . chlorhexidine gluconate (MEDLINE KIT)  15 mL Mouth Rinse BID  . docusate  100 mg Oral BID  . feeding supplement  1 Container Oral TID BM  . ferrous sulfate  325 mg Oral Q breakfast  . gabapentin  300 mg Oral Q12H  . hydrALAZINE  37.5 mg Oral Q8H  . insulin aspart  0-20 Units Subcutaneous Q4H  . insulin glargine  20 Units Subcutaneous Daily  . isosorbide mononitrate  30 mg Oral Daily  . mouth rinse  15 mL Mouth Rinse BID  . pantoprazole  (PROTONIX)  IV  40 mg Intravenous Q12H  . QUEtiapine  50 mg Oral Q supper  . rosuvastatin  10 mg Oral QHS  . sodium chloride flush  3 mL Intravenous Q12H  . sodium chloride flush  5 mL Intracatheter Q8H  . sucralfate  1 g Oral TID WC & HS  . tamsulosin  0.4 mg Oral QPC supper   Continuous Infusions: . sodium chloride Stopped (03/17/20 2016)     LOS: 12 days    Time spent: 38 minutes spent on chart review, discussion with nursing staff, consultants, updating family and interview/physical exam; more than 50% of that time was spent in counseling and/or coordination of care.    Jaquane Boughner J [place] DO [facility] Available via Epic secure chat 7am-7pm After these hours, please refer to coverage provider listed on [email or web address] 03/26/2020, 10:57 AM

## 2020-03-26 NOTE — Progress Notes (Signed)
Progress Note  Patient Name: Isaiah [name] Date of Encounter: 03/26/2020  [facility] Cardiologist: Quay Burow, MD   Subjective   He reports feeling better. Had "indigestion" this AM, resolved w antacid. No dyspnea.  Took a few "baby steps". Hgb stable at 8.6. Creat better at 1.34  Inpatient Medications    Scheduled Meds: . sodium chloride   Intravenous Once  . acetaminophen (TYLENOL) oral liquid 160 mg/5 mL  650 mg Oral Q4H  . acetaminophen  650 mg Rectal Q4H  . allopurinol  100 mg Oral BID  . amoxicillin-clavulanate  1 tablet Oral Q12H  . chlorhexidine gluconate (MEDLINE KIT)  15 mL Mouth Rinse BID  . feeding supplement  1 Container Oral TID BM  . ferrous sulfate  325 mg Oral Q breakfast  . gabapentin  300 mg Oral Q12H  . hydrALAZINE  37.5 mg Oral Q8H  . insulin aspart  0-20 Units Subcutaneous Q4H  . insulin glargine  20 Units Subcutaneous Daily  . isosorbide mononitrate  30 mg Oral Daily  . mouth rinse  15 mL Mouth Rinse BID  . pantoprazole (PROTONIX) IV  40 mg Intravenous Q12H  . QUEtiapine  50 mg Oral Q supper  . rosuvastatin  10 mg Oral QHS  . sodium chloride flush  3 mL Intravenous Q12H  . sodium chloride flush  5 mL Intracatheter Q8H  . sucralfate  1 g Oral TID WC & HS  . tamsulosin  0.4 mg Oral QPC supper   Continuous Infusions: . sodium chloride Stopped (03/17/20 2016)   PRN Meds: docusate, Gerhardt's butt cream, guaiFENesin-dextromethorphan, hydrALAZINE, loperamide, ondansetron **OR** ondansetron (ZOFRAN) IV, traMADol   Vital Signs    Vitals:   03/25/20 2218 03/25/20 2329 03/26/20 0351 03/26/20 0717  BP: (!) 118/51 (!) 127/58 (!) 136/59 (!) 116/94  Pulse: 66 67 70 69  Resp: '20 15 17 16  ' Temp:  98.1 F (36.7 C) 97.8 F (36.6 C) (!) 97 F (36.1 C)  TempSrc:  Axillary Axillary Axillary  SpO2: 95% 95% 97% 100%  Weight:      Height:        Intake/Output Summary (Last 24 hours) at 03/26/2020 1113 Last data filed at 03/26/2020 0900 Gross  per 24 hour  Intake 125 ml  Output 1455 ml  Net -1330 ml   Last 3 Weights 03/24/2020 03/21/2020 03/18/2020  Weight (lbs) 216 lb 14.9 oz 225 lb 5 oz 217 lb 2.5 oz  Weight (kg) 98.4 kg 102.2 kg 98.5 kg      Telemetry    AFib, rate controlled - Personally Reviewed  ECG    AFib, old inferior MI and PRWP c/w old anterolateral MI - little change from 2018 - Personally Reviewed  Physical Exam  Obese. Comfortable lying flat and smiling GEN: No acute distress.   Neck: No JVD Cardiac: irregular, no murmurs, rubs, or gallops.  Respiratory: Clear to auscultation bilaterally. GI: Soft, nontender, non-distended  MS: No edema; No deformity. Neuro:  Nonfocal  Psych: Normal affect   Labs    High Sensitivity Troponin:   Recent Labs  Lab 03/15/20 1705 03/15/20 1916 03/16/20 0008 03/16/20 0309  TROPONINIHS 61* 308* 982* 1,364*      Chemistry Recent Labs  Lab 03/24/20 0028 03/25/20 0033 03/26/20 0057  NA 134* 135 135  K 5.7* 5.2* 4.6  CL 100 100 99  CO2 '26 27 28  ' GLUCOSE 138* 140* 94  BUN 58* 61* 53*  CREATININE 1.21 1.44* 1.34*  CALCIUM 8.9  8.8* 8.7*  PROT 6.0*  --   --   ALBUMIN 2.0*  --   --   AST 22  --   --   ALT 30  --   --   ALKPHOS 73  --   --   BILITOT 0.5  --   --   GFRNONAA >60 49* 54*  ANIONGAP '8 8 8     ' Hematology Recent Labs  Lab 03/24/20 0028 03/24/20 0543 03/25/20 0033 03/25/20 8657 03/25/20 1213 03/25/20 1742 03/26/20 0057  WBC 13.1*  --  12.7*  --   --   --  10.6*  RBC 1.96*  --  2.24*  --   --   --  2.59*  HGB 6.5*   < > 7.6*   < > 9.3* 8.8* 8.6*  HCT 21.3*   < > 23.2*   < > 28.6* 27.1* 26.4*  MCV 108.7*  --  103.6*  --   --   --  101.9*  MCH 33.2  --  33.9  --   --   --  33.2  MCHC 30.5  --  32.8  --   --   --  32.6  RDW 14.3  --  17.0*  --   --   --  18.6*  PLT 275  --  254  --   --   --  250   < > = values in this interval not displayed.    BNP Recent Labs  Lab 03/22/20 0117  BNP 441.2*     DDimer No results for input(s):  DDIMER in the last 168 hours.   Radiology    No results found.  Cardiac Studies   ECHO: 03/16/20 IMPRESSIONS 1. Severe biventricular dysfunction with at least mild-moderate secondary  MR and TR.  2. Left ventricular ejection fraction, by estimation, is 20%. The left  ventricle has severely decreased function. The left ventricle demonstrates  global hypokinesis. There is mild left ventricular hypertrophy. Left  ventricular diastolic parameters are  indeterminate. There is the interventricular septum is flattened in  systole and diastole, consistent with right ventricular pressure and  volume overload.  3. Right ventricular systolic function is severely reduced. The right  ventricular size is severely enlarged.  4. Left atrial size was mild to moderately dilated.  5. Right atrial size was severely dilated.  6. The mitral valve is normal in structure, and demonstrates tenting due  to LV dysfunction. Mild to moderate mitral valve regurgitation. No  evidence of mitral stenosis.  7. Tricuspid valve regurgitation is mild to moderate.  8. The aortic valve is grossly normal. There is mild calcification of the  aortic valve. Aortic valve regurgitation is trivial. No aortic stenosis is  present.   Conclusion(s)/Recommendation(s): No definite left ventricular mural or  apical thrombus/thrombi. Swirling apical contrast.    Patient Profile     79 y.o. male with acute cholecystitis and PEA arrest, noted to have severe LV and RV dysfunction post-arrest and also elevated troponin consistent with demand ischemia of the myocardium.   Assessment & Plan    1.  CHF: grossly euvolemic clinically. Unclear how much of the LV dysfunction is "stunning" post arrest and due to demand ischemia, how much may be permanent. Recheck limited echo (it has been almost 2 weeks since the acute presentation with shock and PEA). If LV EF still low, we can start RAAS inh. Currently on hydralazine/nitrates.  Avoiding beta blockers due to bradycardia. 2. CAD: extensive history of infarction  and PCI in LAD territory. Surprisingly, LVEF was normal on echo in 2018, 50% with regional wall motion abnormalities on echo in 2014, 51% with inferolateral scar on nuclear study in 2013. Will need a minimum of repeat nuclear scan once recovered from the acute illness. On statin. ASA on hold with recent melena. 3. Permanent AFib: spontaneous rate control. Anticoagulants on hold with GI bleed, INR now 1.7. Hgb stable, no further melena. GI reports conservative management without endoscopy is preferred. 4. S/P septic shock w PEA arrest: resolved.     For questions or updates, please contact [facility] Please consult [email or web address] for contact info under        Signed, Sanda Klein, MD  03/26/2020, 11:13 AM

## 2020-03-27 DIAGNOSIS — I10 Essential (primary) hypertension: Secondary | ICD-10-CM

## 2020-03-27 DIAGNOSIS — I5082 Biventricular heart failure: Secondary | ICD-10-CM | POA: Diagnosis not present

## 2020-03-27 DIAGNOSIS — N179 Acute kidney failure, unspecified: Secondary | ICD-10-CM | POA: Diagnosis not present

## 2020-03-27 DIAGNOSIS — A419 Sepsis, unspecified organism: Secondary | ICD-10-CM | POA: Diagnosis not present

## 2020-03-27 DIAGNOSIS — I5021 Acute systolic (congestive) heart failure: Secondary | ICD-10-CM | POA: Diagnosis not present

## 2020-03-27 LAB — HEMOGLOBIN AND HEMATOCRIT, BLOOD
HCT: 27.4 % — ABNORMAL LOW (ref 39.0–52.0)
Hemoglobin: 8.7 g/dL — ABNORMAL LOW (ref 13.0–17.0)

## 2020-03-27 LAB — BASIC METABOLIC PANEL
Anion gap: 5 (ref 5–15)
BUN: 44 mg/dL — ABNORMAL HIGH (ref 8–23)
CO2: 29 mmol/L (ref 22–32)
Calcium: 8.7 mg/dL — ABNORMAL LOW (ref 8.9–10.3)
Chloride: 102 mmol/L (ref 98–111)
Creatinine, Ser: 1.18 mg/dL (ref 0.61–1.24)
GFR, Estimated: >60 mL/min (ref >60)
Glucose, Bld: 128 mg/dL — ABNORMAL HIGH (ref 70–99)
Potassium: 4.9 mmol/L (ref 3.5–5.1)
Sodium: 136 mmol/L (ref 135–145)

## 2020-03-27 LAB — GLUCOSE, CAPILLARY
Glucose-Capillary: 101 mg/dL — ABNORMAL HIGH (ref 70–99)
Glucose-Capillary: 138 mg/dL — ABNORMAL HIGH (ref 70–99)
Glucose-Capillary: 156 mg/dL — ABNORMAL HIGH (ref 70–99)
Glucose-Capillary: 169 mg/dL — ABNORMAL HIGH (ref 70–99)
Glucose-Capillary: 170 mg/dL — ABNORMAL HIGH (ref 70–99)
Glucose-Capillary: 103 mg/dL — ABNORMAL HIGH (ref 70–99)

## 2020-03-27 LAB — CBC
HCT: 25.6 % — ABNORMAL LOW (ref 39.0–52.0)
Hemoglobin: 8.2 g/dL — ABNORMAL LOW (ref 13.0–17.0)
MCH: 33.2 pg (ref 26.0–34.0)
MCHC: 32 g/dL (ref 30.0–36.0)
MCV: 103.6 fL — ABNORMAL HIGH (ref 80.0–100.0)
Platelets: 264 10*3/uL (ref 150–400)
RBC: 2.47 MIL/uL — ABNORMAL LOW (ref 4.22–5.81)
RDW: 17.8 % — ABNORMAL HIGH (ref 11.5–15.5)
WBC: 10.3 10*3/uL (ref 4.0–10.5)
nRBC: 0.5 % — ABNORMAL HIGH (ref 0.0–0.2)

## 2020-03-27 LAB — PROTIME-INR
INR: 1.5 — ABNORMAL HIGH (ref 0.8–1.2)
Prothrombin Time: 17.1 seconds — ABNORMAL HIGH (ref 11.4–15.2)

## 2020-03-27 MED ORDER — FAMOTIDINE IN NACL 20-0.9 MG/50ML-% IV SOLN
20.0000 mg | Freq: Two times a day (BID) | INTRAVENOUS | Status: DC
  Administered 2020-03-27 – 2020-03-29 (×5): 20 mg via INTRAVENOUS
  Filled 2020-03-27 (×6): qty 50

## 2020-03-27 MED ORDER — SODIUM CHLORIDE 0.9 % IV SOLN: INTRAVENOUS | Status: DC

## 2020-03-27 NOTE — Progress Notes (Signed)
Subjective: Ongoing blood in stools despite resolution of coagulopathy. No abdominal pain.  Objective: Vital signs in last 24 hours: Temp:  [97.4 F (36.3 C)-98.1 F (36.7 C)] 97.6 F (36.4 C) (11/14 1055) Pulse Rate:  [69-80] 70 (11/14 1055) Resp:  [17-24] 21 (11/14 1055) BP: (110-145)/(36-62) 143/56 (11/14 1055) SpO2:  [91 %-98 %] 94 % (11/14 1055) Weight change:  Last BM Date: 03/26/20  PE: GEN:  NAD, chronically ill-appearing, overweight ABD:  Protuberant, soft, non-tender  Lab Results: CBC    Component Value Date/Time   WBC 10.3 03/27/2020 0035   RBC 2.47 (L) 03/27/2020 0035   HGB 8.2 (L) 03/27/2020 0035   HCT 25.6 (L) 03/27/2020 0035   PLT 264 03/27/2020 0035   MCV 103.6 (H) 03/27/2020 0035   MCH 33.2 03/27/2020 0035   MCHC 32.0 03/27/2020 0035   RDW 17.8 (H) 03/27/2020 0035   LYMPHSABS 1.5 03/23/2020 0022   MONOABS 0.9 03/23/2020 0022   EOSABS 0.5 03/23/2020 0022   BASOSABS 0.0 03/23/2020 0022   CMP     Component Value Date/Time   NA 136 03/27/2020 0035   K 4.9 03/27/2020 0035   CL 102 03/27/2020 0035   CO2 29 03/27/2020 0035   GLUCOSE 128 (H) 03/27/2020 0035   BUN 44 (H) 03/27/2020 0035   CREATININE 1.18 03/27/2020 0035   CREATININE 1.77 (H) 08/28/2016 1113   CALCIUM 8.7 (L) 03/27/2020 0035   PROT 6.0 (L) 03/24/2020 0028   ALBUMIN 2.0 (L) 03/24/2020 0028   AST 22 03/24/2020 0028   ALT 30 03/24/2020 0028   ALKPHOS 73 03/24/2020 0028   BILITOT 0.5 03/24/2020 0028   GFRNONAA >60 03/27/2020 0035   GFRAA 46 (L) 12/14/2018 0825   Assessment:  1.  Blood in stool.   2.  Anemia, recurrent, transfusion-requiring. 3.  Coagulopathy, resolved, now off anticoagulation. 4.  Multiple medical problems.  Plan:  1.  Clear liquid diet. 2.  NPO after midnight. 3.  Serial CBCs; transfuse as needed; continue PPI. 4.  Anticoagulation on hold. 5.  Dr. Royann Shivers (cardiology) discussed possibility of doing endoscopy; I think that is reasonable; we will plan on  this tomorrow.  Risks/Benefits reviewed directly with patient and son (in exam room) and daughter (over the telephone). 6.  Risks (bleeding, infection, bowel perforation that could require surgery, sedation-related changes in cardiopulmonary systems), benefits (identification and possible treatment of source of symptoms, exclusion of certain causes of symptoms), and alternatives (watchful waiting, radiographic imaging studies, empiric medical treatment) of upper endoscopy (EGD) were explained to patient/family in detail and patient wishes to proceed. 7.  [facility] will follow.   Freddy Jaksch 03/27/2020, 2:16 PM   Cell [phone] If no answer or after 5 PM call [phone]

## 2020-03-27 NOTE — Progress Notes (Addendum)
Progress Note  Patient Name: Isaiah [name] Date of Encounter: 03/27/2020  [facility] Cardiologist: Quay Burow, MD  Subjective   Feels well except for indigestion. No dyspnea and no angina. Continues to have melena, including this AM. Hgb drifted down to 8.2. Has prominent dyspeptic symptoms, briefly relieved with antacids. Echo shows LV function has normalized. Improved creatinine, renal function back to baseline.   Inpatient Medications    Scheduled Meds: . sodium chloride   Intravenous Once  . acetaminophen (TYLENOL) oral liquid 160 mg/5 mL  650 mg Oral Q4H  . acetaminophen  650 mg Rectal Q4H  . allopurinol  100 mg Oral BID  . amoxicillin-clavulanate  1 tablet Oral Q12H  . chlorhexidine gluconate (MEDLINE KIT)  15 mL Mouth Rinse BID  . feeding supplement  1 Container Oral TID BM  . ferrous sulfate  325 mg Oral Q breakfast  . gabapentin  300 mg Oral Q12H  . hydrALAZINE  37.5 mg Oral Q8H  . insulin aspart  0-20 Units Subcutaneous Q4H  . insulin glargine  20 Units Subcutaneous Daily  . isosorbide mononitrate  30 mg Oral Daily  . mouth rinse  15 mL Mouth Rinse BID  . pantoprazole (PROTONIX) IV  40 mg Intravenous Q12H  . QUEtiapine  50 mg Oral Q supper  . rosuvastatin  10 mg Oral QHS  . sodium chloride flush  3 mL Intravenous Q12H  . sodium chloride flush  5 mL Intracatheter Q8H  . sucralfate  1 g Oral TID WC & HS  . tamsulosin  0.4 mg Oral QPC supper   Continuous Infusions: . sodium chloride Stopped (03/17/20 2016)  . sodium chloride    . famotidine (PEPCID) IV     PRN Meds: alum & mag hydroxide-simeth, docusate, Gerhardt's butt cream, guaiFENesin-dextromethorphan, hydrALAZINE, loperamide, ondansetron **OR** ondansetron (ZOFRAN) IV, traMADol   Vital Signs    Vitals:   03/26/20 1922 03/26/20 2323 03/27/20 0355 03/27/20 0730  BP: (!) 145/62 (!) 125/52 (!) 133/54 (!) 135/59  Pulse: 73 72 69 80  Resp: (!) 23 (!) _0 Temp: 97.7 F (36.5 C) 97.7 F  (36.5 C) 97.7 F (36.5 C) 98.1 F (36.7 C)  TempSrc: Oral Oral Oral Axillary  SpO2: 95% 95% 96% 91%  Weight:      Height:        Intake/Output Summary (Last 24 hours) at 03/27/2020 0955 Last data filed at 03/27/2020 0800 Gross per 24 hour  Intake 480 ml  Output 10 ml  Net 470 ml   Last 3 Weights 03/24/2020 03/21/2020 03/18/2020  Weight (lbs) 216 lb 14.9 oz 225 lb 5 oz 217 lb 2.5 oz  Weight (kg) 98.4 kg 102.2 kg 98.5 kg      Telemetry    Afib, rate controlled - Personally Reviewed  ECG    No new tracing - Personally Reviewed  Physical Exam  Appears well, a little pale. GEN: No acute distress.   Neck: No JVD Cardiac: irregular, no murmurs, rubs, or gallops.  Respiratory: Clear to auscultation bilaterally. GI: Soft, nontender, non-distended  MS: No edema; No deformity. Neuro:  Nonfocal  Psych: Normal affect   Labs    High Sensitivity Troponin:   Recent Labs  Lab 03/15/20 1705 03/15/20 1916 03/16/20 0008 03/16/20 0309  TROPONINIHS 61* 308* 982* 1,364*      Chemistry Recent Labs  Lab 03/24/20 0028 03/24/20 0028 03/25/20 0033 03/26/20 0057 03/27/20 0035  NA 134*   < > 135 135 136  K 5.7*   < > 5.2* 4.6 4.9  CL 100   < > 100 99 102  CO2 26   < > _0 GLUCOSE 138*   < > 140* 94 128*  BUN 58*   < > 61* 53* 44*  CREATININE 1.21   < > 1.44* 1.34* 1.18  CALCIUM 8.9   < > 8.8* 8.7* 8.7*  PROT 6.0*  --   --   --   --   ALBUMIN 2.0*  --   --   --   --   AST 22  --   --   --   --   ALT 30  --   --   --   --   ALKPHOS 73  --   --   --   --   BILITOT 0.5  --   --   --   --   GFRNONAA >60   < > 49* 54* >60  ANIONGAP 8   < > _1 < > = values in this interval not displayed.     Hematology Recent Labs  Lab 03/25/20 0033 03/25/20 3382 03/26/20 0057 03/26/20 1415 03/27/20 0035  WBC 12.7*  --  10.6*  --  10.3  RBC 2.24*  --  2.59*  --  2.47*  HGB 7.6*   < > 8.6* 9.1* 8.2*  HCT 23.2*   < > 26.4* 29.1* 25.6*  MCV 103.6*  --  101.9*  --  103.6*    MCH 33.9  --  33.2  --  33.2  MCHC 32.8  --  32.6  --  32.0  RDW 17.0*  --  18.6*  --  17.8*  PLT 254  --  250  --  264   < > = values in this interval not displayed.    BNP Recent Labs  Lab 03/22/20 0117  BNP 441.2*     DDimer No results for input(s): DDIMER in the last 168 hours.   Radiology    ECHOCARDIOGRAM LIMITED  Result Date: 03/26/2020    ECHOCARDIOGRAM LIMITED REPORT   Patient Name:   Isaiah [name] [facility] Date of Exam: 03/26/2020 Medical Rec #:  [id number]      Height:       69.0 in Accession #:    [id number]     Weight:       216.9 lb Date of Birth:  Dec 13, 1940      BSA:          2.139 m Patient Age:    79 years       BP:           124/50 mmHg Patient Gender: M              HR:           70 bpm. Exam Location:  Inpatient Procedure: Limited Echo, Cardiac Doppler, Color Doppler and Intracardiac            Opacification Agent Indications:    CHF  History:        Patient has prior history of Echocardiogram examinations, most                 recent 03/16/2020. CHF, Previous Myocardial Infarction and CAD,                 PAD, Arrythmias:Atrial Fibrillation and Cardiac Arrest; Risk  Factors:Hypertension, Dyslipidemia and Morbid obesity.  Sonographer:    Dustin Flock Referring Phys: [phone] Daun Rens  Sonographer Comments: Patient is morbidly obese. Image acquisition challenging due to patient body habitus. IMPRESSIONS  1. Limited study; all views not obtained; akinesis of the distal inferolateral wall with overall normal LV systolic function.  2. Left ventricular ejection fraction, by estimation, is 55 to 60%. The left ventricle has normal function. The left ventricle demonstrates regional wall motion abnormalities (see scoring diagram/findings for description). There is mild left ventricular  hypertrophy. Left ventricular diastolic parameters are consistent with Grade II diastolic dysfunction (pseudonormalization). Elevated left atrial pressure.  3. Right ventricular systolic  function is normal. The right ventricular size is mildly enlarged. There is severely elevated pulmonary artery systolic pressure.  4. Right atrial size was mildly dilated.  5. The mitral valve is normal in structure. Mild mitral valve regurgitation. No evidence of mitral stenosis.  6. The aortic valve was not assessed. No aortic stenosis is present. FINDINGS  Left Ventricle: Left ventricular ejection fraction, by estimation, is 55 to 60%. The left ventricle has normal function. The left ventricle demonstrates regional wall motion abnormalities. Definity contrast agent was given IV to delineate the left ventricular endocardial borders. The left ventricular internal cavity size was normal in size. There is mild left ventricular hypertrophy. Left ventricular diastolic parameters are consistent with Grade II diastolic dysfunction (pseudonormalization). Elevated left atrial pressure. Right Ventricle: The right ventricular size is mildly enlarged.Right ventricular systolic function is normal. There is severely elevated pulmonary artery systolic pressure. Left Atrium: Left atrial size was normal in size. Right Atrium: Right atrial size was mildly dilated. Pericardium: There is no evidence of pericardial effusion. Mitral Valve: The mitral valve is normal in structure. Mild mitral annular calcification. Mild mitral valve regurgitation. No evidence of mitral valve stenosis. Tricuspid Valve: The tricuspid valve is normal in structure. Tricuspid valve regurgitation is mild . No evidence of tricuspid stenosis. Aortic Valve: The aortic valve was not assessed. No aortic stenosis is present. Pulmonic Valve: The pulmonic valve was not assessed. Aorta: The aortic root is normal in size and structure. Venous: The inferior vena cava was not well visualized.  Additional Comments: Limited study; all views not obtained; akinesis of the distal inferolateral wall with overall normal LV systolic function. LEFT VENTRICLE PLAX 2D LVIDd:          4.70 cm Diastology LVIDs:         3.85 cm LV e' medial:    5.77 cm/s LV PW:         1.20 cm LV E/e' medial:  23.1 LV IVS:        1.10 cm LV e' lateral:   11.40 cm/s                        LV E/e' lateral: 11.7  RIGHT VENTRICLE RV S prime:     6.53 cm/s AORTIC VALVE LVOT Vmax:   77.50 cm/s LVOT Vmean:  51.000 cm/s LVOT VTI:    0.168 m MITRAL VALVE                TRICUSPID VALVE MV Area (PHT): 4.80 cm     TR Peak grad:   76.4 mmHg MV Decel Time: 158 msec     TR Vmax:        437.00 cm/s MV E velocity: 133.00 cm/s MV A velocity: 70.00 cm/s   SHUNTS MV E/A ratio:  1.90  Systemic VTI: 0.17 m Kirk Ruths MD Electronically signed by Kirk Ruths MD Signature Date/Time: 2020-04-21/4:02:54 PM    Final     Cardiac Studies   FOLLOW UP LIMITED ECHO Apr 21, 2020 1. Limited study; all views not obtained; akinesis of the distal  inferolateral wall with overall normal LV systolic function.  2. Left ventricular ejection fraction, by estimation, is 55 to 60%. The  left ventricle has normal function. The left ventricle demonstrates  regional wall motion abnormalities (see scoring diagram/findings for  description). There is mild left ventricular  hypertrophy. Left ventricular diastolic parameters are consistent with  Grade II diastolic dysfunction (pseudonormalization). Elevated left atrial  pressure.  3. Right ventricular systolic function is normal. The right ventricular  size is mildly enlarged. There is severely elevated pulmonary artery  systolic pressure.  4. Right atrial size was mildly dilated.  5. The mitral valve is normal in structure. Mild mitral valve  regurgitation. No evidence of mitral stenosis.  6. The aortic valve was not assessed. No aortic stenosis is present.   Patient Profile     79 y.o. male with acute cholecystitis and PEA arrest, noted to have severe LV and RV dysfunction post-arrest and also elevated troponin consistent with demand ischemia of the  myocardium.  Assessment & Plan    1. Critical illness cardiomyopathy: resolved. Grossly euvolemic clinically. Rapid and near complete recovery of LV function. Normal overall EF. If endoscopy is needed, risk of CV complications would be lower at this pont. 2. CAD: extensive history of infarction and PCI in LAD territory. Surprisingly, LVEF was normal on echo in 2018, 50% with regional wall motion abnormalities on echo in 2014, 51% with inferolateral scar on nuclear study in 2013. The residual area of LV hypokinesis corresponds to the scar described in 2013 perfusion study. On statin. ASA on hold with recent melena. Plan outpatient nuclear perfusion study [facility] Myoview) once fully recovered. No plan for invasive angiography until he is estimated to be at low risk for recurrent GI bleeding. 3. Permanent AFib: spontaneous rate control. Anticoagulants on hold with GI bleed, INR now 1.5. Hgb with slow downward trend since transfusion, ongoing melena. GI initially recommended conservative management without endoscopy is preferred, considering poor cardiac status and full anticoagulation. With improvement in LVEF and reduction in INR, may need to reevaluate that in view of ongoing upper GI symptoms and blood loss. Will discuss with Dr. Paulita Fujita. 4. S/P septic shock w PEA arrest: resolved. Renal function back to baseline.         For questions or updates, please contact [place] Please consult [email or web address] for contact info under        Signed, Sanda Klein, MD  03/27/2020, 9:55 AM  \

## 2020-03-27 NOTE — Plan of Care (Signed)
  Problem: Education: Goal: Knowledge of General Education information will improve Description: Including pain rating scale, medication(s)/side effects and non-pharmacologic comfort measures Outcome: Progressing   Problem: Health Behavior/Discharge Planning: Goal: Ability to manage health-related needs will improve Outcome: Progressing   Problem: Clinical Measurements: Goal: Ability to maintain clinical measurements within normal limits will improve Outcome: Progressing Goal: Will remain free from infection Outcome: Progressing Goal: Diagnostic test results will improve Outcome: Progressing Goal: Respiratory complications will improve Outcome: Progressing Goal: Cardiovascular complication will be avoided Outcome: Progressing   Problem: Activity: Goal: Risk for activity intolerance will decrease Outcome: Progressing   Problem: Nutrition: Goal: Adequate nutrition will be maintained Outcome: Progressing   Problem: Coping: Goal: Level of anxiety will decrease Outcome: Progressing   Problem: Elimination: Goal: Will not experience complications related to bowel motility Outcome: Progressing Goal: Will not experience complications related to urinary retention Outcome: Progressing   Problem: Pain Managment: Goal: General experience of comfort will improve Outcome: Progressing   Problem: Safety: Goal: Ability to remain free from injury will improve Outcome: Progressing   Problem: Skin Integrity: Goal: Risk for impaired skin integrity will decrease Outcome: Progressing   Problem: Education: Goal: Knowledge of disease or condition will improve Outcome: Progressing Goal: Understanding of medication regimen will improve Outcome: Progressing Goal: Individualized Educational Video(s) Outcome: Progressing   Problem: Activity: Goal: Ability to tolerate increased activity will improve Outcome: Progressing   Problem: Cardiac: Goal: Ability to achieve and maintain  adequate cardiopulmonary perfusion will improve Outcome: Progressing   Problem: Health Behavior/Discharge Planning: Goal: Ability to safely manage health-related needs after discharge will improve Outcome: Progressing   Problem: Education: Goal: Knowledge of disease and its progression will improve Outcome: Progressing   Problem: Health Behavior/Discharge Planning: Goal: Ability to manage health-related needs will improve Outcome: Progressing   Problem: Clinical Measurements: Goal: Complications related to the disease process or treatment will be avoided or minimized Outcome: Progressing Goal: Dialysis access will remain free of complications Outcome: Progressing   Problem: Activity: Goal: Activity intolerance will improve Outcome: Progressing   Problem: Fluid Volume: Goal: Fluid volume balance will be maintained or improved Outcome: Progressing   Problem: Nutritional: Goal: Ability to make appropriate dietary choices will improve Outcome: Progressing   Problem: Respiratory: Goal: Respiratory symptoms related to disease process will be avoided Outcome: Progressing   Problem: Self-Concept: Goal: Body image disturbance will be avoided or minimized Outcome: Progressing   Problem: Urinary Elimination: Goal: Progression of disease will be identified and treated Outcome: Progressing   Problem: Fluid Volume: Goal: Hemodynamic stability will improve Outcome: Progressing   Problem: Clinical Measurements: Goal: Diagnostic test results will improve Outcome: Progressing Goal: Signs and symptoms of infection will decrease Outcome: Progressing   Problem: Respiratory: Goal: Ability to maintain adequate ventilation will improve Outcome: Progressing   

## 2020-03-27 NOTE — Progress Notes (Signed)
PROGRESS NOTE    Isaiah [name]  JIR:[id number] DOB: 06-Apr-1941 DOA: 03/14/2020 PCP: [name], [name], MD    Brief Narrative:  Isaiah [name] is a 79 year old male with past medical history notable for peripheral vascular disease, CAD, essential hypertension, hyperlipidemia, type 2 diabetes mellitus, atrial fibrillation on Coumadin, chronic systolic congestive heart failure who presented to the ED with weakness and a pressure ulcer on the buttock.  Patient also complaining of right upper quadrant pain.  In the ED, patient noted to be febrile with a leukocytosis.  Elevated creatinine.  CT abdomen/pelvis concerning for acute cholecystitis.  Patient was started on broad-spectrum antibiotics and IV fluids.  General surgery was consulted.  Hospital service consulted for admission for severe sepsis secondary to acute cholecystitis.   Assessment & Plan:   Principal Problem:   Sepsis due to undetermined organism [facility]) Active Problems:   Coronary artery disease   Chronic atrial fibrillation (HCC)   Essential hypertension   Hyperlipidemia   Type 2 diabetes mellitus with vascular disease (HCC)   Obstructive sleep apnea   CKD (chronic kidney disease), stage III ([place])   ARF (acute renal failure) (HCC)   Pressure injury of skin   Acute cholecystitis   Class 1 obesity due to excess calories with body mass index (BMI) of 33.0 to 33.9 in adult   [facility])   Cardiac arrest ([place])   Acute systolic heart failure (HCC)   Biventricular failure ([place])   Anemia due to GI blood loss   Severe sepsis with septic shock Patient presenting to the ED with fever, leukocytosis, renal failure and encephalopathy.  Shortly following admission, patient became hypotensive despite aggressive IV fluid resuscitation and was transferred to the ICU service for pressor support.  Patient was able to be weaned from vasopressors; and was transreferred back to the hospitalist service on 03/19/2020.  Acute  cholecystitis Patient presenting to ED with right upper quadrant pain.  Imaging notable for distended gallbladder with layering gallstone and mild pericholestatic stranding.  Given severity of illness requiring ICU level of care and pressure support, patient underwent percutaneous cholecystostomy by IR on 03/16/2020. --WBC 21.2>>>12.7>10.6>10.3 --IV zosyn transitioned to Augmentin; plan to complete 14 day course (End: 11/15) --Continue cholecystostomy to gravity, flush 82m TID; 169mout past 24h  Acute on chronic combined systolic/diastolic congestive heart failure; decompensated with associated cardiogenic shock Patient presenting with septic shock as above with associated cholecystitis with likely underlying cardiogenic shock requiring vasopressors and ventilatory support, extubated on 03/17/2020 and weaned off vasopressors.  TTE with severe biventricular dysfunction with LVEF 20% with severely dilated RA, moderately dilated LA. Repeat limited echocardiogram 03/26/2020 shows improvement of LVEF to 55-60%, with grade 2 diastolic dysfunction. --Cardiology following, appreciate assistance --net negative 3027mast 24hrs and net negative 4.2L since admission --continue isosorbide mononitrate 30 mg p.o. daily --Hydralazine 37.5 mg q8h --avoiding ACEi/ARB 2/2 AKI, and BB 2/2 bradycardia  Macrocytic Anemia/iron deficiency:  Concern for UGIB --Iron 42, TIBC 253, Ferritin 118, foalte 16.6, B12 888, FOBT +  --Hgb 13.9>>>10.5>9.3>9.4>7.8>6.4>7.6>9.2>9.3>8.8>8.6>9.1>8.2 --s/p IV Feraheme 03/22/2020; continue home ferrous sulfate 325m25m daily --Protonix 40 mg IV q12h --Carafate 1 g p.o. TIDAC/HS --famotidine 20mg31mq12h --transfused 2u pRBC 11/11; 1u pRBC 11/12 --[facility] consulted, supportive care for now given his significant debility  --Clear liquid diet --CBC daily; transfuse to maintain hemoglobin greater than 8  Hyperkalemia: Resolved Potassium 4.9 today, s/p 10g Lokelma on 11/11 and 5g Lokelma  11/12 --Continue monitor on telemetry --Repeat electrolytes in a.m.  Acute renal failure on CKD stage IIIb: Presented with creatinine 3.46 with baseline roughly 1.6. etiology likely secondary to prerenal failure from dehydration, sepsis, and shock as above.  Now appears back to baseline. --Cr 3.45>>>1.25>1.21>1.44>1.34>1.18 --Avoid nephrotoxins, renally dose all medications --Closely monitor renal function daily.  Chronic permanent atrial fibrillation Currently rate controlled.  On Coumadin for anticoagulation at home.  Was also on IV heparin for NSTEMI during hospitalization.  Hospital course remarkable for bradycardia cardiac episodes and amiodarone was discontinued.  Not on beta-blockers. --INR 1.5 today; continue to hold Coumadin 2/2 UGIB  Acute metabolic encephalopathy: Secondary to sepsis.  Currently alert and oriented.He is hard of hearing  Cough:  Most likely secondary to congestive heart failure.  No signs of pneumonia on chest xray.  Cough has improved.  Type II NSTEMI/elevated troponin:  Supply demand ischemia from sepsis/septic shock.  Cardiology were consulted with no plan for any intervention.  Urinary retention: resolved. Continue intermittent straight cath if needed. Started on flomax  Coronary artery disease:  No complaint of chest pain.  Continue Imdur.  Follow-up with cardiology as an outpatient. Status post CABG and PCI with stents.   --Holding coumadin as above  Hypertension:  Currently blood pressure stable.  Continue hydralazine, Imdur, aspirin and statin  Hyperlipidemia: On Crestor  Diabetes type 2:  Poorly controlled, hemoglobin A1c 12.1.  Diabetic coordinator was following. --Lantus 20 units [place] daily --Resistant NovoLog insulin sliding scale for coverage --CBGs qAC/HS  OSA: On CPAP at night  Obesity: BMI of 33.  Counseled on need for weight loss/lifestyle changes this complicates all facets of care.  Debility/deconditioning/memory  deficits:  PT/OT consulted skilled nursing facility on discharge.  Patient lives with his daughter.  On Seroquel at home. Daughter Butch Penny wants to take him home with home health.  Stage II pressure injury, buttocks; POA Pressure Injury 03/14/20 Buttocks Right Stage 2 -  Partial thickness loss of dermis presenting as a shallow open injury with a red, pink wound bed without slough. (Active)  03/14/20 2302  Location: Buttocks  Location Orientation: Right  Staging: Stage 2 -  Partial thickness loss of dermis presenting as a shallow open injury with a red, pink wound bed without slough.  Wound Description (Comments):   Present on Admission: Yes     Pressure Injury 03/14/20 Buttocks Left Stage 2 -  Partial thickness loss of dermis presenting as a shallow open injury with a red, pink wound bed without slough. (Active)  03/14/20 2300  Location: Buttocks  Location Orientation: Left  Staging: Stage 2 -  Partial thickness loss of dermis presenting as a shallow open injury with a red, pink wound bed without slough.  Wound Description (Comments):   Present on Admission: Yes  Wound care consulted and were following.   Continue local wound care, offloading.   DVT prophylaxis: SCDs Code Status: Full code Family Communication: None present at bedside, updated patient's daughter, Butch Penny via telephone this morning.  Disposition Plan:  Status is: Inpatient  Remains inpatient appropriate because:Unsafe d/c plan, IV treatments appropriate due to intensity of illness or inability to take PO and Inpatient level of care appropriate due to severity of illness   Dispo: The patient is from: Home              Anticipated d/c is to: Home              Anticipated d/c date is: 2 days  Patient currently is not medically stable to d/c.    Consultants:   PCCM  Interventional radiology  General surgery  Cardiology  [facility] gastroenterology, Dr. Paulita Fujita  Procedures:   IR placement  cholecystostomy on 03/17/2020  Antimicrobials:   Zosyn 11/2 - 11/5  Augmentin 11/5>> (end 11/15)  Metronidazole 11/1 - 11/2  Cefepime 11/2 -11/2  Ceftriaxone 11/2 - 11/2   Subjective: Patient seen and examined at bedside, continues to complain of epigastric pain and indigestion.  Hemoglobin 8.2 this morning, stable.  No further bowel movements overnight.  Nursing concerned regarding orders for EGD this morning; which were apparently placed inadvertently.  INR down to 1.5, will continue to hold Coumadin; likely indefinitely.  Updated patient's daughter via telephone this morning.  No other complaints or concerns at this time. Denies headache, no dizziness, no chest pain, no palpitations, no shortness of breath.  No acute events overnight per nursing staff.  Objective: Vitals:   03/26/20 1922 03/26/20 2323 03/27/20 0355 03/27/20 0730  BP: (!) 145/62 (!) 125/52 (!) 133/54 (!) 135/59  Pulse: 73 72 69 80  Resp: (!) 23 (!) _0 Temp: 97.7 F (36.5 C) 97.7 F (36.5 C) 97.7 F (36.5 C) 98.1 F (36.7 C)  TempSrc: Oral Oral Oral Axillary  SpO2: 95% 95% 96% 91%  Weight:      Height:        Intake/Output Summary (Last 24 hours) at 03/27/2020 1020 Last data filed at 03/27/2020 0800 Gross per 24 hour  Intake 480 ml  Output 10 ml  Net 470 ml   Filed Weights   03/18/20 0400 03/21/20 1217 03/24/20 0500  Weight: 98.5 kg 102.2 kg 98.4 kg    Examination:  General exam: Appears calm and comfortable, chronically ill in appearance Respiratory system: Clear to auscultation. Respiratory effort normal.  On 1 L nasal cannula with SPO2 96% Cardiovascular system: S1 & S2 heard, RRR. No JVD, murmurs, rubs, gallops or clicks. No pedal edema. Gastrointestinal system: Abdomen is nondistended, soft, mild tenderness noted surrounding cholecystostomy site with dressing clean/dry/intact; but do note bloody drainage in cholecystostomy bag, mild epigastric pain.. No organomegaly or masses felt.  Normal bowel sounds heard. Central nervous system: Alert and oriented. No focal neurological deficits Extremities: Symmetric 5 x 5 power. Skin: No rashes, lesions or ulcers Psychiatry: Judgement and insight appear poor. Mood & affect appropriate.     Data Reviewed: I have personally reviewed following labs and imaging studies  CBC: Recent Labs  Lab 03/23/20 0022 03/23/20 0022 03/24/20 0028 03/24/20 0543 03/25/20 0033 03/25/20 1224 03/25/20 1213 03/25/20 1742 03/26/20 0057 03/26/20 1415 03/27/20 0035  WBC 12.3*  --  13.1*  --  12.7*  --   --   --  10.6*  --  10.3  NEUTROABS 9.3*  --   --   --   --   --   --   --   --   --   --   HGB 7.8*   < > 6.5*   < > 7.6*   < > 9.3* 8.8* 8.6* 9.1* 8.2*  HCT 25.1*   < > 21.3*   < > 23.2*   < > 28.6* 27.1* 26.4* 29.1* 25.6*  MCV 108.2*  --  108.7*  --  103.6*  --   --   --  101.9*  --  103.6*  PLT 257  --  275  --  254  --   --   --  250  --  264   < > = values in this interval not displayed.   Basic Metabolic Panel: Recent Labs  Lab 03/23/20 0022 03/24/20 0028 03/25/20 0033 03/26/20 0057 03/27/20 0035  NA 135 134* 135 135 136  K 5.0 5.7* 5.2* 4.6 4.9  CL 99 100 100 99 102  CO2 _0 GLUCOSE 147* 138* 140* 94 128*  BUN 53* 58* 61* 53* 44*  CREATININE 1.25* 1.21 1.44* 1.34* 1.18  CALCIUM 8.8* 8.9 8.8* 8.7* 8.7*   GFR: Estimated Creatinine Clearance: 58.7 mL/min (by C-G formula based on SCr of 1.18 mg/dL). Liver Function Tests: Recent Labs  Lab 03/24/20 0028  AST 22  ALT 30  ALKPHOS 73  BILITOT 0.5  PROT 6.0*  ALBUMIN 2.0*   No results for input(s): LIPASE, AMYLASE in the last 168 hours. No results for input(s): AMMONIA in the last 168 hours. Coagulation Profile: Recent Labs  Lab 03/23/20 0022 03/24/20 0028 03/25/20 0033 03/26/20 0057 03/27/20 0035  INR 2.7* 2.5* 2.3* 1.7* 1.5*   Cardiac Enzymes: No results for input(s): CKTOTAL, CKMB, CKMBINDEX, TROPONINI in the last 168 hours. BNP (last 3  results) No results for input(s): PROBNP in the last 8760 hours. HbA1C: No results for input(s): HGBA1C in the last 72 hours. CBG: Recent Labs  Lab 03/26/20 1608 03/26/20 1917 03/26/20 2318 03/27/20 0458 03/27/20 0727  GLUCAP 173* 198* 142* 101* 138*   Lipid Profile: No results for input(s): CHOL, HDL, LDLCALC, TRIG, CHOLHDL, LDLDIRECT in the last 72 hours. Thyroid Function Tests: No results for input(s): TSH, T4TOTAL, FREET4, T3FREE, THYROIDAB in the last 72 hours. Anemia Panel: No results for input(s): VITAMINB12, FOLATE, FERRITIN, TIBC, IRON, RETICCTPCT in the last 72 hours. Sepsis Labs: No results for input(s): PROCALCITON, LATICACIDVEN in the last 168 hours.  No results found for this or any previous visit (from the past 240 hour(s)).       Radiology Studies: ECHOCARDIOGRAM LIMITED  Result Date: 03/26/2020    ECHOCARDIOGRAM LIMITED REPORT   Patient Name:   Isaiah [name] [facility] Date of Exam: 03/26/2020 Medical Rec #:  [id number]      Height:       69.0 in Accession #:    [id number]     Weight:       216.9 lb Date of Birth:  1941/03/24      BSA:          2.139 m Patient Age:    29 years       BP:           124/50 mmHg Patient Gender: M              HR:           70 bpm. Exam Location:  Inpatient Procedure: Limited Echo, Cardiac Doppler, Color Doppler and Intracardiac            Opacification Agent Indications:    CHF  History:        Patient has prior history of Echocardiogram examinations, most                 recent 03/16/2020. CHF, Previous Myocardial Infarction and CAD,                 PAD, Arrythmias:Atrial Fibrillation and Cardiac Arrest; Risk                 Factors:Hypertension, Dyslipidemia and Morbid obesity.  Sonographer:    Dustin Flock Referring Phys: [place]  Sonographer Comments: Patient is morbidly obese. Image acquisition challenging due to patient body habitus. IMPRESSIONS  1. Limited study; all views not obtained; akinesis of the distal inferolateral  wall with overall normal LV systolic function.  2. Left ventricular ejection fraction, by estimation, is 55 to 60%. The left ventricle has normal function. The left ventricle demonstrates regional wall motion abnormalities (see scoring diagram/findings for description). There is mild left ventricular  hypertrophy. Left ventricular diastolic parameters are consistent with Grade II diastolic dysfunction (pseudonormalization). Elevated left atrial pressure.  3. Right ventricular systolic function is normal. The right ventricular size is mildly enlarged. There is severely elevated pulmonary artery systolic pressure.  4. Right atrial size was mildly dilated.  5. The mitral valve is normal in structure. Mild mitral valve regurgitation. No evidence of mitral stenosis.  6. The aortic valve was not assessed. No aortic stenosis is present. FINDINGS  Left Ventricle: Left ventricular ejection fraction, by estimation, is 55 to 60%. The left ventricle has normal function. The left ventricle demonstrates regional wall motion abnormalities. Definity contrast agent was given IV to delineate the left ventricular endocardial borders. The left ventricular internal cavity size was normal in size. There is mild left ventricular hypertrophy. Left ventricular diastolic parameters are consistent with Grade II diastolic dysfunction (pseudonormalization). Elevated left atrial pressure. Right Ventricle: The right ventricular size is mildly enlarged.Right ventricular systolic function is normal. There is severely elevated pulmonary artery systolic pressure. Left Atrium: Left atrial size was normal in size. Right Atrium: Right atrial size was mildly dilated. Pericardium: There is no evidence of pericardial effusion. Mitral Valve: The mitral valve is normal in structure. Mild mitral annular calcification. Mild mitral valve regurgitation. No evidence of mitral valve stenosis. Tricuspid Valve: The tricuspid valve is normal in structure. Tricuspid  valve regurgitation is mild . No evidence of tricuspid stenosis. Aortic Valve: The aortic valve was not assessed. No aortic stenosis is present. Pulmonic Valve: The pulmonic valve was not assessed. Aorta: The aortic root is normal in size and structure. Venous: The inferior vena cava was not well visualized.  Additional Comments: Limited study; all views not obtained; akinesis of the distal inferolateral wall with overall normal LV systolic function. LEFT VENTRICLE PLAX 2D LVIDd:         4.70 cm Diastology LVIDs:         3.85 cm LV e' medial:    5.77 cm/s LV PW:         1.20 cm LV E/e' medial:  23.1 LV IVS:        1.10 cm LV e' lateral:   11.40 cm/s                        LV E/e' lateral: 11.7  RIGHT VENTRICLE RV S prime:     6.53 cm/s AORTIC VALVE LVOT Vmax:   77.50 cm/s LVOT Vmean:  51.000 cm/s LVOT VTI:    0.168 m MITRAL VALVE                TRICUSPID VALVE MV Area (PHT): 4.80 cm     TR Peak grad:   76.4 mmHg MV Decel Time: 158 msec     TR Vmax:        437.00 cm/s MV E velocity: 133.00 cm/s MV A velocity: 70.00 cm/s   SHUNTS MV E/A ratio:  1.90         Systemic VTI: 0.17 m Kirk Ruths MD Electronically signed by Kirk Ruths MD  Signature Date/Time: 03/26/2020/4:02:54 PM    Final         Scheduled Meds: . sodium chloride   Intravenous Once  . acetaminophen (TYLENOL) oral liquid 160 mg/5 mL  650 mg Oral Q4H  . acetaminophen  650 mg Rectal Q4H  . allopurinol  100 mg Oral BID  . amoxicillin-clavulanate  1 tablet Oral Q12H  . chlorhexidine gluconate (MEDLINE KIT)  15 mL Mouth Rinse BID  . feeding supplement  1 Container Oral TID BM  . ferrous sulfate  325 mg Oral Q breakfast  . gabapentin  300 mg Oral Q12H  . hydrALAZINE  37.5 mg Oral Q8H  . insulin aspart  0-20 Units Subcutaneous Q4H  . insulin glargine  20 Units Subcutaneous Daily  . isosorbide mononitrate  30 mg Oral Daily  . mouth rinse  15 mL Mouth Rinse BID  . pantoprazole (PROTONIX) IV  40 mg Intravenous Q12H  . QUEtiapine  50 mg  Oral Q supper  . rosuvastatin  10 mg Oral QHS  . sodium chloride flush  3 mL Intravenous Q12H  . sodium chloride flush  5 mL Intracatheter Q8H  . sucralfate  1 g Oral TID WC & HS  . tamsulosin  0.4 mg Oral QPC supper   Continuous Infusions: . sodium chloride Stopped (03/17/20 2016)  . sodium chloride    . famotidine (PEPCID) IV       LOS: 13 days    Time spent: 38 minutes spent on chart review, discussion with nursing staff, consultants, updating family and interview/physical exam; more than 50% of that time was spent in counseling and/or coordination of care.    Blayze Haen J [place] DO [facility] Available via Epic secure chat 7am-7pm After these hours, please refer to coverage provider listed on [email or web address] 03/27/2020, 10:20 AM

## 2020-03-27 NOTE — Progress Notes (Signed)
Orders have appeared this morning by Edrick Kins, PA for EGD procedure and starting fluids, per Jill Side and Endo nurse, B. Grace,RN----disregard these orders, EGD is not scheduled---Alison,PA is unable to enter note or show orders cancelled at this time

## 2020-03-28 ENCOUNTER — Inpatient Hospital Stay (HOSPITAL_COMMUNITY): Payer: Medicare Other | Admitting: Certified Registered Nurse Anesthetist

## 2020-03-28 ENCOUNTER — Encounter (HOSPITAL_COMMUNITY): Payer: Self-pay | Admitting: Internal Medicine

## 2020-03-28 ENCOUNTER — Encounter (HOSPITAL_COMMUNITY)
Admission: EM | Disposition: A | Discharge status: Home Health | Payer: Self-pay | Source: Home / Self Care | Attending: Internal Medicine

## 2020-03-28 DIAGNOSIS — I5021 Acute systolic (congestive) heart failure: Secondary | ICD-10-CM | POA: Diagnosis not present

## 2020-03-28 DIAGNOSIS — I5082 Biventricular heart failure: Secondary | ICD-10-CM | POA: Diagnosis not present

## 2020-03-28 DIAGNOSIS — I469 Cardiac arrest, cause unspecified: Secondary | ICD-10-CM | POA: Diagnosis not present

## 2020-03-28 DIAGNOSIS — A419 Sepsis, unspecified organism: Secondary | ICD-10-CM | POA: Diagnosis not present

## 2020-03-28 HISTORY — PX: ESOPHAGOGASTRODUODENOSCOPY (EGD) WITH PROPOFOL: SHX5813

## 2020-03-28 LAB — BASIC METABOLIC PANEL
Anion gap: 5 (ref 5–15)
BUN: 35 mg/dL — ABNORMAL HIGH (ref 8–23)
CO2: 30 mmol/L (ref 22–32)
Calcium: 8.5 mg/dL — ABNORMAL LOW (ref 8.9–10.3)
Chloride: 99 mmol/L (ref 98–111)
Creatinine, Ser: 1.18 mg/dL (ref 0.61–1.24)
GFR, Estimated: >60 mL/min (ref >60)
Glucose, Bld: 98 mg/dL (ref 70–99)
Potassium: 5 mmol/L (ref 3.5–5.1)
Sodium: 134 mmol/L — ABNORMAL LOW (ref 135–145)

## 2020-03-28 LAB — GLUCOSE, CAPILLARY
Glucose-Capillary: 105 mg/dL — ABNORMAL HIGH (ref 70–99)
Glucose-Capillary: 127 mg/dL — ABNORMAL HIGH (ref 70–99)
Glucose-Capillary: 142 mg/dL — ABNORMAL HIGH (ref 70–99)
Glucose-Capillary: 150 mg/dL — ABNORMAL HIGH (ref 70–99)
Glucose-Capillary: 160 mg/dL — ABNORMAL HIGH (ref 70–99)
Glucose-Capillary: 176 mg/dL — ABNORMAL HIGH (ref 70–99)

## 2020-03-28 LAB — CBC
HCT: 26.5 % — ABNORMAL LOW (ref 39.0–52.0)
Hemoglobin: 8.3 g/dL — ABNORMAL LOW (ref 13.0–17.0)
MCH: 33.3 pg (ref 26.0–34.0)
MCHC: 31.3 g/dL (ref 30.0–36.0)
MCV: 106.4 fL — ABNORMAL HIGH (ref 80.0–100.0)
Platelets: 258 10*3/uL (ref 150–400)
RBC: 2.49 MIL/uL — ABNORMAL LOW (ref 4.22–5.81)
RDW: 18.2 % — ABNORMAL HIGH (ref 11.5–15.5)
WBC: 10.2 10*3/uL (ref 4.0–10.5)
nRBC: 0.5 % — ABNORMAL HIGH (ref 0.0–0.2)

## 2020-03-28 LAB — PROTIME-INR
INR: 1.3 — ABNORMAL HIGH (ref 0.8–1.2)
Prothrombin Time: 15.6 seconds — ABNORMAL HIGH (ref 11.4–15.2)

## 2020-03-28 SURGERY — ESOPHAGOGASTRODUODENOSCOPY (EGD) WITH PROPOFOL
Anesthesia: Monitor Anesthesia Care | Laterality: Left

## 2020-03-28 MED ORDER — PANTOPRAZOLE SODIUM 40 MG PO TBEC
40.0000 mg | DELAYED_RELEASE_TABLET | Freq: Every day | ORAL | Status: DC
  Administered 2020-03-28 – 2020-04-01 (×4): 40 mg via ORAL
  Filled 2020-03-28 (×4): qty 1

## 2020-03-28 MED ORDER — PROPOFOL 500 MG/50ML IV EMUL: INTRAVENOUS | Status: DC | PRN

## 2020-03-28 MED ORDER — LACTATED RINGERS IV SOLN: INTRAVENOUS | Status: DC

## 2020-03-28 MED ORDER — GLYCOPYRROLATE 0.2 MG/ML IJ SOLN
INTRAMUSCULAR | Status: DC | PRN
  Administered 2020-03-28: .2 mg via INTRAVENOUS

## 2020-03-28 MED ORDER — PROPOFOL 10 MG/ML IV BOLUS
INTRAVENOUS | Status: DC | PRN
  Administered 2020-03-28 (×2): 10 mg via INTRAVENOUS

## 2020-03-28 MED ORDER — PHENYLEPHRINE HCL-NACL 10-0.9 MG/250ML-% IV SOLN
INTRAVENOUS | Status: DC | PRN
  Administered 2020-03-28: 35 ug/min via INTRAVENOUS

## 2020-03-28 SURGICAL SUPPLY — 15 items

## 2020-03-28 NOTE — Progress Notes (Signed)
Referring Physician(s): Dr Bea Laura [place]  Supervising Physician: Dr Marliss Coots  Patient Status:  [facility] - In-pt  Chief Complaint:  Percutaneous cholecystostomy tube drain Placed in IR 03/16/20  Subjective:  Just back from [facility]-- and Dtr at bedside Perc chole drain intact OP bloody bile-- minimal OP  No complaints  Allergies: Patient has no known allergies.  Medications: Prior to Admission medications   Medication Sig Start Date End Date Taking? Authorizing Provider  acetaminophen (TYLENOL) 500 MG tablet Take 500-1,000 mg by mouth every 6 (six) hours as needed for mild pain or headache.   Yes [provider]  allopurinol (ZYLOPRIM) 100 MG tablet TAKE 2 TABLETS BY MOUTH DAILY. Patient taking differently: Take 100 mg by mouth 2 (two) times daily.  05/03/14  Yes Runell Gess, MD  colchicine 0.6 MG tablet Take 0.6 mg by mouth daily as needed (gout attacks).   Yes [provider]  ferrous sulfate 325 (65 FE) MG tablet Take 1 tablet (325 mg total) by mouth 2 (two) times daily with a meal. Patient taking differently: Take 325 mg by mouth daily with breakfast.  08/09/16  Yes Ghimire, Werner Lean, MD  furosemide (LASIX) 40 MG tablet Take 1 tablet (40 mg total) by mouth 2 (two) times daily. Patient taking differently: Take 40 mg by mouth in the morning.  08/24/16  Yes Osvaldo Shipper, MD  gabapentin (NEURONTIN) 300 MG capsule Take 300-600 mg by mouth See admin instructions. Take 300 mg by mouth three times a day and 600 mg at bedtime   Yes [provider]  glipiZIDE (GLUCOTROL) 5 MG tablet Take 5 mg by mouth 2 (two) times daily before a meal.   Yes [provider]  hydrALAZINE (APRESOLINE) 100 MG tablet Take 100 mg by mouth 2 (two) times daily.   Yes [provider]  insulin aspart protamine- aspart (NOVOLOG MIX 70/30) (70-30) 100 UNIT/ML injection Inject 50 Units into the skin 2 (two) times daily before a meal.    Yes [provider]  isosorbide mononitrate (IMDUR) 60 MG 24 hr tablet Take 1 tablet (60 mg total) by mouth daily. Appointment needed for future refills Patient taking differently: Take 60 mg by mouth daily.  05/03/15  Yes Runell Gess, MD  metolazone (ZAROXOLYN) 2.5 MG tablet Take 2.5 mg by mouth daily as needed (AS DIRECTED FOR MARKED SWELLING).   Yes [provider]  Multiple Vitamin (MULTIVITAMIN WITH MINERALS) TABS tablet Take 1 tablet by mouth daily.   Yes [provider]  nitroGLYCERIN (NITROSTAT) 0.4 MG SL tablet Place 0.4 mg under the tongue every 5 (five) minutes as needed for chest pain.   Yes [provider]  pantoprazole (PROTONIX) 40 MG tablet Take 40 mg by mouth 2 (two) times daily before a meal.    Yes [provider]  potassium chloride (K-DUR) 10 MEQ tablet Take 1 tablet (10 mEq total) by mouth daily. 08/31/16  Yes Barrett, Joline Salt, PA-C  pravastatin (PRAVACHOL) 40 MG tablet Take 40 mg by mouth at bedtime.    Yes [provider]  PRESCRIPTION MEDICATION Inhale into the lungs See admin instructions. CPAP- At bedtime and during any time of rest   Yes [provider]  Psyllium (METAMUCIL PO) Take 2 capsules by mouth at bedtime.   Yes [provider]  QUEtiapine (SEROQUEL) 50 MG tablet Take 50 mg by mouth at bedtime.    Yes [provider]  warfarin (COUMADIN) 1 MG tablet  Take 1 mg by mouth daily as needed (in conjunction with the 5 mg strength, AS DIRECTED- depending on INR).  12/18/19  Yes [provider]  warfarin (COUMADIN) 5 MG tablet Take 5 mg by mouth daily with supper. 01/28/20  Yes [provider]  docusate sodium (COLACE) 100 MG capsule Take 1 capsule (100 mg total) by mouth 2 (two) times daily. Patient not taking: Reported on 03/14/2020 12/16/18   Jadene Pierinistergard, Thomas A, MD  oxyCODONE (OXY IR/ROXICODONE) 5 MG immediate release tablet Take 1 tablet (5 mg total) by mouth every 4 (four) hours as  needed (pain). Patient not taking: Reported on 03/14/2020 12/16/18   Jadene Pierinistergard, Thomas A, MD  sodium chloride 0.9 % injection Instill 5 mL daily into percutaneous drain. 03/21/20 05/13/20  Lynnette CaffeyWatterson, Shannon A, PA-C     Vital Signs: BP (!) 166/51   Pulse 91   Temp 98 F (36.7 C) (Oral)   Resp (!) 22   Ht 5\' 9"  (1.753 m)   Wt 216 lb 14.9 oz (98.4 kg)   SpO2 93%   BMI 32.04 kg/m   Physical Exam Skin:    General: Skin is warm.     Comments[facility] [employer] [facility] is clean and dry NT no bleeding OP bloody bile 5-10 cc yesterday 10 cc in bag       Imaging: ECHOCARDIOGRAM LIMITED  Result Date: 03/26/2020    ECHOCARDIOGRAM LIMITED REPORT   Patient Name:   Isaiah [name] Date of Exam: 03/26/2020 Medical Rec #:  [id number]      Height:       69.0 in Accession #:    [id number]     Weight:       216.9 lb Date of Birth:  Dec 08, 1940      BSA:          2.139 m Patient Age:    79 years       BP:           124/50 mmHg Patient Gender: M              HR:           70 bpm. Exam Location:  Inpatient Procedure: Limited Echo, Cardiac Doppler, Color Doppler and Intracardiac            Opacification Agent Indications:    CHF  History:        Patient has prior history of Echocardiogram examinations, most                 recent 03/16/2020. CHF, Previous Myocardial Infarction and CAD,                 PAD, Arrythmias:Atrial Fibrillation and Cardiac Arrest; Risk                 Factors:Hypertension, Dyslipidemia and Morbid obesity.  Sonographer:    Lavenia AtlasBrooke Strickland Referring Phys: [phone] MIHAI CROITORU  Sonographer Comments: Patient is morbidly obese. Image acquisition challenging due to patient body habitus. IMPRESSIONS  1. Limited study; all views not obtained; akinesis of the distal inferolateral wall with overall normal LV systolic function.  2. Left ventricular ejection fraction, by estimation, is 55 to 60%. The left ventricle has normal function. The left ventricle demonstrates regional wall motion  abnormalities (see scoring diagram/findings for description). There is mild left ventricular  hypertrophy. Left ventricular diastolic parameters are consistent with Grade II diastolic dysfunction (pseudonormalization). Elevated left atrial pressure.  3. Right ventricular systolic function is  normal. The right ventricular size is mildly enlarged. There is severely elevated pulmonary artery systolic pressure.  4. Right atrial size was mildly dilated.  5. The mitral valve is normal in structure. Mild mitral valve regurgitation. No evidence of mitral stenosis.  6. The aortic valve was not assessed. No aortic stenosis is present. FINDINGS  Left Ventricle: Left ventricular ejection fraction, by estimation, is 55 to 60%. The left ventricle has normal function. The left ventricle demonstrates regional wall motion abnormalities. Definity contrast agent was given IV to delineate the left ventricular endocardial borders. The left ventricular internal cavity size was normal in size. There is mild left ventricular hypertrophy. Left ventricular diastolic parameters are consistent with Grade II diastolic dysfunction (pseudonormalization). Elevated left atrial pressure. Right Ventricle: The right ventricular size is mildly enlarged.Right ventricular systolic function is normal. There is severely elevated pulmonary artery systolic pressure. Left Atrium: Left atrial size was normal in size. Right Atrium: Right atrial size was mildly dilated. Pericardium: There is no evidence of pericardial effusion. Mitral Valve: The mitral valve is normal in structure. Mild mitral annular calcification. Mild mitral valve regurgitation. No evidence of mitral valve stenosis. Tricuspid Valve: The tricuspid valve is normal in structure. Tricuspid valve regurgitation is mild . No evidence of tricuspid stenosis. Aortic Valve: The aortic valve was not assessed. No aortic stenosis is present. Pulmonic Valve: The pulmonic valve was not assessed. Aorta: The  aortic root is normal in size and structure. Venous: The inferior vena cava was not well visualized.  Additional Comments: Limited study; all views not obtained; akinesis of the distal inferolateral wall with overall normal LV systolic function. LEFT VENTRICLE PLAX 2D LVIDd:         4.70 cm Diastology LVIDs:         3.85 cm LV e' medial:    5.77 cm/s LV PW:         1.20 cm LV E/e' medial:  23.1 LV IVS:        1.10 cm LV e' lateral:   11.40 cm/s                        LV E/e' lateral: 11.7  RIGHT VENTRICLE RV S prime:     6.53 cm/s AORTIC VALVE LVOT Vmax:   77.50 cm/s LVOT Vmean:  51.000 cm/s LVOT VTI:    0.168 m MITRAL VALVE                TRICUSPID VALVE MV Area (PHT): 4.80 cm     TR Peak grad:   76.4 mmHg MV Decel Time: 158 msec     TR Vmax:        437.00 cm/s MV E velocity: 133.00 cm/s MV A velocity: 70.00 cm/s   SHUNTS MV E/A ratio:  1.90         Systemic VTI: 0.17 m Olga Millers MD Electronically signed by Olga Millers MD Signature Date/Time: 03/26/2020/4:02:54 PM    Final     Labs:  CBC: Recent Labs    03/25/20 0033 03/25/20 7035 03/26/20 0057 03/26/20 0057 03/26/20 1415 03/27/20 0035 03/27/20 1449 03/28/20 0145  WBC 12.7*  --  10.6*  --   --  10.3  --  10.2  HGB 7.6*   < > 8.6*   < > 9.1* 8.2* 8.7* 8.3*  HCT 23.2*   < > 26.4*   < > 29.1* 25.6* 27.4* 26.5*  PLT 254  --  250  --   --  264  --  258   < > = values in this interval not displayed.    COAGS: Recent Labs    03/14/20 0911 03/15/20 0643 03/15/20 1757 03/18/20 0229 03/25/20 0033 03/26/20 0057 03/27/20 0035 03/28/20 0145  INR 2.7*   < > 1.5*   < > 2.3* 1.7* 1.5* 1.3*  APTT 44*  --  35  --   --   --   --   --    < > = values in this interval not displayed.    BMP: Recent Labs    03/25/20 0033 03/26/20 0057 03/27/20 0035 03/28/20 0145  NA 135 135 136 134*  K 5.2* 4.6 4.9 5.0  CL 100 99 102 99  CO2 27 28 29 30   GLUCOSE 140* 94 128* 98  BUN 61* 53* 44* 35*  CALCIUM 8.8* 8.7* 8.7* 8.5*  CREATININE  1.44* 1.34* 1.18 1.18  GFRNONAA 49* 54* >60 >60    LIVER FUNCTION TESTS: Recent Labs    03/14/20 0743 03/24/20 0028  BILITOT 1.1 0.5  AST 24 22  ALT 13 30  ALKPHOS 82 73  PROT 7.8 6.0*  ALBUMIN 2.8* 2.0*    Assessment and Plan:  To DC to home possibly tomorrow per Dtr at bedside To flush perc chole drain daily 5 cc sterile saline (will need Rx for flushes at DC) IR will contact pt for OP follow up at [facility] to remain 6-8 weeks   Electronically Signed: [facility], PA-C 03/28/2020, 10:25 AM   I spent a total of 15 Minutes at the the patient's bedside AND on the patient's hospital floor or unit, greater than 50% of which was counseling/coordinating care for perc chole drain

## 2020-03-28 NOTE — Progress Notes (Signed)
Progress Note  Patient Name: Isaiah [name] Date of Encounter: 03/28/2020  [facility] Cardiologist: Quay Burow, MD   Subjective   He has no cardiac complaints.  He is scheduled for upper endoscopy today based upon Dr. Erlinda Hong note from yesterday.  Inpatient Medications    Scheduled Meds: . sodium chloride   Intravenous Once  . acetaminophen (TYLENOL) oral liquid 160 mg/5 mL  650 mg Oral Q4H  . acetaminophen  650 mg Rectal Q4H  . allopurinol  100 mg Oral BID  . amoxicillin-clavulanate  1 tablet Oral Q12H  . chlorhexidine gluconate (MEDLINE KIT)  15 mL Mouth Rinse BID  . feeding supplement  1 Container Oral TID BM  . ferrous sulfate  325 mg Oral Q breakfast  . gabapentin  300 mg Oral Q12H  . hydrALAZINE  37.5 mg Oral Q8H  . insulin aspart  0-20 Units Subcutaneous Q4H  . insulin glargine  20 Units Subcutaneous Daily  . isosorbide mononitrate  30 mg Oral Daily  . mouth rinse  15 mL Mouth Rinse BID  . pantoprazole (PROTONIX) IV  40 mg Intravenous Q12H  . QUEtiapine  50 mg Oral Q supper  . rosuvastatin  10 mg Oral QHS  . sodium chloride flush  3 mL Intravenous Q12H  . sodium chloride flush  5 mL Intracatheter Q8H  . sucralfate  1 g Oral TID WC & HS  . tamsulosin  0.4 mg Oral QPC supper   Continuous Infusions: . sodium chloride Stopped (03/17/20 2016)  . sodium chloride Stopped (03/27/20 1149)  . famotidine (PEPCID) IV Stopped (03/28/20 0710)   PRN Meds: alum & mag hydroxide-simeth, docusate, Gerhardt's butt cream, guaiFENesin-dextromethorphan, hydrALAZINE, loperamide, ondansetron **OR** ondansetron (ZOFRAN) IV, traMADol   Vital Signs    Vitals:   03/27/20 1915 03/27/20 2323 03/28/20 0336 03/28/20 0730  BP: (!) 148/64 (!) 135/53  (!) 140/58  Pulse: 71 65  70  Resp: (!) _0 Temp: 98.1 F (36.7 C) 98.1 F (36.7 C) 98 F (36.7 C) 97.7 F (36.5 C)  TempSrc: Oral Oral Oral Oral  SpO2: 90% 96%  97%  Weight:      Height:        Intake/Output Summary  (Last 24 hours) at 03/28/2020 0748 Last data filed at 03/28/2020 0535 Gross per 24 hour  Intake 290 ml  Output 555 ml  Net -265 ml   Last 3 Weights 03/24/2020 03/21/2020 03/18/2020  Weight (lbs) 216 lb 14.9 oz 225 lb 5 oz 217 lb 2.5 oz  Weight (kg) 98.4 kg 102.2 kg 98.5 kg      Telemetry    .  Review of trend over the past 24 hours reviews no significant arrhythmias.  Heart rate is flat around 65 bpm.  Could be first-degree AV block with prolonged PR or junctional rhythm.- Personally Reviewed  ECG    No recent tracing- Personally Reviewed  Physical Exam  Morbidly obese GEN:  Overly obese Neck: No JVD Cardiac:  No obvious murmur.  Regular rhythm.  No significant murmur. Respiratory:  Anterior auscultation reveals clear lungs. GI:  Soft without evidence of rebound MS:  No edema Neuro:  Nonfocal.  Awakens.  Conversant. Psych: Normal affect   Labs    High Sensitivity Troponin:   Recent Labs  Lab 03/15/20 1705 03/15/20 1916 03/16/20 0008 03/16/20 0309  TROPONINIHS 61* 308* 982* 1,364*      Chemistry Recent Labs  Lab 03/24/20 0028 03/25/20 0033 03/26/20 0057 03/27/20 0035 03/28/20  0145  NA 134*   < > 135 136 134*  K 5.7*   < > 4.6 4.9 5.0  CL 100   < > 99 102 99  CO2 26   < > _0 GLUCOSE 138*   < > 94 128* 98  BUN 58*   < > 53* 44* 35*  CREATININE 1.21   < > 1.34* 1.18 1.18  CALCIUM 8.9   < > 8.7* 8.7* 8.5*  PROT 6.0*  --   --   --   --   ALBUMIN 2.0*  --   --   --   --   AST 22  --   --   --   --   ALT 30  --   --   --   --   ALKPHOS 73  --   --   --   --   BILITOT 0.5  --   --   --   --   GFRNONAA >60   < > 54* >60 >60  ANIONGAP 8   < > _1 < > = values in this interval not displayed.     Hematology Recent Labs  Lab 03/26/20 0057 03/26/20 1415 03/27/20 0035 03/27/20 1449 03/28/20 0145  WBC 10.6*  --  10.3  --  10.2  RBC 2.59*  --  2.47*  --  2.49*  HGB 8.6*   < > 8.2* 8.7* 8.3*  HCT 26.4*   < > 25.6* 27.4* 26.5*  MCV 101.9*  --   103.6*  --  106.4*  MCH 33.2  --  33.2  --  33.3  MCHC 32.6  --  32.0  --  31.3  RDW 18.6*  --  17.8*  --  18.2*  PLT 250  --  264  --  258   < > = values in this interval not displayed.    BNP Recent Labs  Lab 03/22/20 0117  BNP 441.2*     DDimer No results for input(s): DDIMER in the last 168 hours.   Radiology    ECHOCARDIOGRAM LIMITED  Result Date: 03/26/2020    ECHOCARDIOGRAM LIMITED REPORT   Patient Name:   Isaiah [name] [facility] Date of Exam: 03/26/2020 Medical Rec #:  [id number]      Height:       69.0 in Accession #:    [id number]     Weight:       216.9 lb Date of Birth:  1941/01/21      BSA:          2.139 m Patient Age:    79 years       BP:           124/50 mmHg Patient Gender: M              HR:           70 bpm. Exam Location:  Inpatient Procedure: Limited Echo, Cardiac Doppler, Color Doppler and Intracardiac            Opacification Agent Indications:    CHF  History:        Patient has prior history of Echocardiogram examinations, most                 recent 03/16/2020. CHF, Previous Myocardial Infarction and CAD,                 PAD, Arrythmias:Atrial Fibrillation and Cardiac Arrest; Risk  Factors:Hypertension, Dyslipidemia and Morbid obesity.  Sonographer:    Dustin Flock Referring Phys: [phone] MIHAI CROITORU  Sonographer Comments: Patient is morbidly obese. Image acquisition challenging due to patient body habitus. IMPRESSIONS  1. Limited study; all views not obtained; akinesis of the distal inferolateral wall with overall normal LV systolic function.  2. Left ventricular ejection fraction, by estimation, is 55 to 60%. The left ventricle has normal function. The left ventricle demonstrates regional wall motion abnormalities (see scoring diagram/findings for description). There is mild left ventricular  hypertrophy. Left ventricular diastolic parameters are consistent with Grade II diastolic dysfunction (pseudonormalization). Elevated left atrial pressure.  3. Right  ventricular systolic function is normal. The right ventricular size is mildly enlarged. There is severely elevated pulmonary artery systolic pressure.  4. Right atrial size was mildly dilated.  5. The mitral valve is normal in structure. Mild mitral valve regurgitation. No evidence of mitral stenosis.  6. The aortic valve was not assessed. No aortic stenosis is present. FINDINGS  Left Ventricle: Left ventricular ejection fraction, by estimation, is 55 to 60%. The left ventricle has normal function. The left ventricle demonstrates regional wall motion abnormalities. Definity contrast agent was given IV to delineate the left ventricular endocardial borders. The left ventricular internal cavity size was normal in size. There is mild left ventricular hypertrophy. Left ventricular diastolic parameters are consistent with Grade II diastolic dysfunction (pseudonormalization). Elevated left atrial pressure. Right Ventricle: The right ventricular size is mildly enlarged.Right ventricular systolic function is normal. There is severely elevated pulmonary artery systolic pressure. Left Atrium: Left atrial size was normal in size. Right Atrium: Right atrial size was mildly dilated. Pericardium: There is no evidence of pericardial effusion. Mitral Valve: The mitral valve is normal in structure. Mild mitral annular calcification. Mild mitral valve regurgitation. No evidence of mitral valve stenosis. Tricuspid Valve: The tricuspid valve is normal in structure. Tricuspid valve regurgitation is mild . No evidence of tricuspid stenosis. Aortic Valve: The aortic valve was not assessed. No aortic stenosis is present. Pulmonic Valve: The pulmonic valve was not assessed. Aorta: The aortic root is normal in size and structure. Venous: The inferior vena cava was not well visualized.  Additional Comments: Limited study; all views not obtained; akinesis of the distal inferolateral wall with overall normal LV systolic function. LEFT VENTRICLE  PLAX 2D LVIDd:         4.70 cm Diastology LVIDs:         3.85 cm LV e' medial:    5.77 cm/s LV PW:         1.20 cm LV E/e' medial:  23.1 LV IVS:        1.10 cm LV e' lateral:   11.40 cm/s                        LV E/e' lateral: 11.7  RIGHT VENTRICLE RV S prime:     6.53 cm/s AORTIC VALVE LVOT Vmax:   77.50 cm/s LVOT Vmean:  51.000 cm/s LVOT VTI:    0.168 m MITRAL VALVE                TRICUSPID VALVE MV Area (PHT): 4.80 cm     TR Peak grad:   76.4 mmHg MV Decel Time: 158 msec     TR Vmax:        437.00 cm/s MV E velocity: 133.00 cm/s MV A velocity: 70.00 cm/s   SHUNTS MV E/A ratio:  1.90  Systemic VTI: 0.17 m Kirk Ruths MD Electronically signed by Kirk Ruths MD Signature Date/Time: 03/26/2020/4:02:54 PM    Final     Cardiac Studies   No new data  Patient Profile     79 y.o. male 79 y.o. male with acute cholecystitis and PEA arrest, noted to have severe LV dysfunction post-arrest and also elevated troponin consistent with demand ischemia of the myocardium.  Subsequently has demonstrated recovery of LV systolic function but with the development of melena, decreasing hemoglobin, and transfusion requirement.   Assessment & Plan  1. Acute on chronic systolic heart failure, resolved.  LVEF now greater than 50%.  Initial severe reduction was postarrest and was likely ischemia/metabolic related to global ischemia that has subsequently resolved.  No new recommendations 2. Elevated troponin: Likely demand related in setting of severe stress of bradycardia asystolic cardiac arrest.  Unless he develops symptoms that are compatible with ischemia, an invasive evaluation would not be sought. 3. PEA cardiac arrest occurring in the setting of percutaneous placement of a gallbladder drain. 4. Atrial fibrillation with controlled rate response. 5. History of chronic anticoagulation: On hold related to melena and anemia 6. Melena and anemia: This is being further investigated by GI.     For questions  or updates, please contact [place] Please consult [email or web address] for contact info under        Signed, Sinclair Grooms, MD  03/28/2020, 7:48 AM

## 2020-03-28 NOTE — Transfer of Care (Signed)
Immediate Anesthesia Transfer of Care Note  Patient: Isaiah [name]  Procedure(s) Performed: ESOPHAGOGASTRODUODENOSCOPY (EGD) WITH PROPOFOL (Left )  Patient Location: PACU and Endoscopy Unit  Anesthesia Type:MAC  Level of Consciousness: alert  and patient cooperative  Airway & Oxygen Therapy: Patient Spontanous Breathing and Patient connected to nasal cannula oxygen  Post-op Assessment: Report given to RN and Post -op Vital signs reviewed and stable  Post vital signs: Reviewed and stable  Last Vitals:  Vitals Value Taken Time  BP 168/49 03/28/20 0943  Temp 36.7 C 03/28/20 0943  Pulse 90 03/28/20 0950  Resp 25 03/28/20 0950  SpO2 96 % 03/28/20 0950  Vitals shown include unvalidated device data.  Last Pain:  Vitals:   03/28/20 0943  TempSrc: Oral  PainSc: 0-No pain      Patients Stated Pain Goal: 0 (40/34/74 2595)  Complications: No complications documented.

## 2020-03-28 NOTE — Interval H&P Note (Signed)
History and Physical Interval Note:  03/28/2020 9:01 AM  Isaiah [name]  has presented today for surgery, with the diagnosis of blood in stool, anemia.  The various methods of treatment have been discussed with the patient and family. After consideration of risks, benefits and other options for treatment, the patient has consented to  Procedure(s): ESOPHAGOGASTRODUODENOSCOPY (EGD) WITH PROPOFOL (Left) as a surgical intervention.  The patient's history has been reviewed, patient examined, no change in status, stable for surgery.  I have reviewed the patient's chart and labs.  Questions were answered to the patient's satisfaction.     Katy Fitch Edwyna Dangerfield

## 2020-03-28 NOTE — Op Note (Signed)
[facility] Patient Name: Isaiah HugerRobert [name] Procedure Date : 03/28/2020 MRN: [id number] Attending MD: Bernette Redbirdobert Tyresse Jayson , MD Date of Birth: 1941-03-09 CSN: [id number] Age: 79 Admit Type: Inpatient Procedure:                Upper GI endoscopy Indications:              Acute post hemorrhagic anemia, Melena Providers:                Bernette Redbirdobert Anikah Hogge, MD, Charlett LangoMike Marshall, RN, Rosilyn MingsNichole                            Montalvo, Technician, Harrington ChallengerHope Parker, Technician,                            Levora AngelJenna Smith, CRNA Referring MD:              Medicines:                Monitored Anesthesia Care Complications:            No immediate complications. Estimated Blood Loss:     Estimated blood loss: none. Procedure:                Pre-Anesthesia Assessment:                           - Prior to the procedure, a History and Physical                            was performed, and patient medications and                            allergies were reviewed. The patient's tolerance of                            previous anesthesia was also reviewed. The risks                            and benefits of the procedure and the sedation                            options and risks were discussed with the patient.                            All questions were answered, and informed consent                            was obtained. Prior Anticoagulants: The patient has                            taken Coumadin (warfarin), last dose was 16 days                            prior to procedure. ASA Grade Assessment: III - A  patient with severe systemic disease. After                            reviewing the risks and benefits, the patient was                            deemed in satisfactory condition to undergo the                            procedure.                           After obtaining informed consent, the endoscope was                            passed under direct vision. Throughout the                             procedure, the patient's blood pressure, pulse, and                            oxygen saturations were monitored continuously. The                            [id number]-[id number] ([id number]) Olympus slim endoscope was                            introduced through the mouth, and advanced to the                            second part of duodenum. The upper GI endoscopy was                            accomplished without difficulty. The patient                            tolerated the procedure well. Scope In: Scope Out: Findings:      The larynx was normal.      The examined esophagus was normal.      There is no endoscopic evidence of esophagitis, varices or Mallory-Weiss       tear in the entire esophagus.      Patchy mildly erythematous mucosa without bleeding was found in the       gastric fundus.      The exam of the stomach was otherwise normal.      There is no endoscopic evidence of bleeding or blood in the stomach,       ulceration, fluid collections, angiodysplasia, mass or erosion in the       entire examined stomach.      The examined duodenum was normal. Impression:               - No active bleeding or blood in the stomach at the                            time of this exam.                           -  Normal larynx.                           - Normal esophagus.                           - Erythematous mucosa in the gastric fundus,                            consistent with resolving stress gastritis. THIS,                            COUPLED WITH ANTICOAGULATION, WAS THE PATIENT'S                            LIKELY CAUSE OF RECENT BLEEDING.                           - Normal examined duodenum.                           - No specimens collected. Recommendation:           - Continue present medications. Will add sucralfate                            for 1 week to further promote resolution of                            gastritis.                           -  Non-erosive, non-hemorrhagic erythematous                            gastritis is felt to be at low risk for further                            bleeding.                           - OK to resume Coumadin (warfarin) at prior dose if                            felt to be clinically important by Cardiology. Procedure Code(s):        --- Professional ---                           [phone], Esophagogastroduodenoscopy, flexible,                            transoral; diagnostic, including collection of                            specimen(s) by brushing or washing, when performed                            (  separate procedure) Diagnosis Code(s):        --- Professional ---                           K31.89, Other diseases of stomach and duodenum                           D62, Acute posthemorrhagic anemia                           K92.1, Melena (includes Hematochezia) CPT copyright 2019 [employer]. All rights reserved. The codes documented in this report are preliminary and upon coder review may  be revised to meet current compliance requirements. Bernette Redbird, MD 03/28/2020 9:41:21 AM This report has been signed electronically. Number of Addenda: 0

## 2020-03-28 NOTE — Anesthesia Preprocedure Evaluation (Addendum)
Anesthesia Evaluation  Patient identified by MRN, date of birth, ID band Patient awake    Reviewed: Allergy & Precautions, H&P , NPO status , Patient's Chart, lab work & pertinent test results  Airway Mallampati: III  TM Distance: >3 FB Neck ROM: Full    Dental no notable dental hx. (+) Teeth Intact, Dental Advisory Given   Pulmonary sleep apnea , former smoker,    Pulmonary exam normal breath sounds clear to auscultation       Cardiovascular hypertension, Pt. on medications + CAD, + Past MI and +CHF   Rhythm:Regular Rate:Normal     Neuro/Psych Dementia CVA negative neurological ROS     GI/Hepatic Neg liver ROS, GERD  Medicated,  Endo/Other  negative endocrine ROSdiabetes, Insulin Dependent, Oral Hypoglycemic Agents  Renal/GU Renal disease  negative genitourinary   Musculoskeletal   Abdominal   Peds  Hematology  (+) Blood dyscrasia, anemia ,   Anesthesia Other Findings   Reproductive/Obstetrics negative OB ROS                            Anesthesia Physical Anesthesia Plan  ASA: III  Anesthesia Plan: MAC   Post-op Pain Management:    Induction: Intravenous  PONV Risk Score and Plan: 1 and Propofol infusion  Airway Management Planned: Nasal Cannula  Additional Equipment:   Intra-op Plan:   Post-operative Plan:   Informed Consent: I have reviewed the patients History and Physical, chart, labs and discussed the procedure including the risks, benefits and alternatives for the proposed anesthesia with the patient or authorized representative who has indicated his/her understanding and acceptance.     Dental advisory given  Plan Discussed with: CRNA  Anesthesia Plan Comments:         Anesthesia Quick Evaluation

## 2020-03-28 NOTE — Progress Notes (Signed)
Visit made to patients room to place CPAP.  Patient states he don't want to wear CPAP tonight.

## 2020-03-28 NOTE — Progress Notes (Addendum)
PROGRESS NOTE    Isaiah [name]  AJO:[id number] DOB: 29-Nov-1940 DOA: 03/14/2020 PCP: [name], [name], MD    Brief Narrative:  Isaiah [name] is a 79 year old male with past medical history notable for peripheral vascular disease, CAD, essential hypertension, hyperlipidemia, type 2 diabetes mellitus, atrial fibrillation on Coumadin, chronic systolic congestive heart failure who presented to the ED with weakness and a pressure ulcer on the buttock.  Patient also complaining of right upper quadrant pain.  In the ED, patient noted to be febrile with a leukocytosis.  Elevated creatinine.  CT abdomen/pelvis concerning for acute cholecystitis.  Patient was started on broad-spectrum antibiotics and IV fluids.  General surgery was consulted.  Hospital service consulted for admission for severe sepsis secondary to acute cholecystitis.   Assessment & Plan:   Principal Problem:   Sepsis due to undetermined organism [facility]) Active Problems:   Coronary artery disease   Chronic atrial fibrillation (HCC)   Essential hypertension   Hyperlipidemia   Type 2 diabetes mellitus with vascular disease (HCC)   Obstructive sleep apnea   CKD (chronic kidney disease), stage III ([place])   ARF (acute renal failure) (HCC)   Pressure injury of skin   Acute cholecystitis   Class 1 obesity due to excess calories with body mass index (BMI) of 33.0 to 33.9 in adult   [facility])   Cardiac arrest ([place])   Acute systolic heart failure (HCC)   Biventricular failure ([place])   Anemia due to GI blood loss   Severe sepsis with septic shock Patient presenting to the ED with fever, leukocytosis, renal failure and encephalopathy.  Shortly following admission, patient became hypotensive despite aggressive IV fluid resuscitation and was transferred to the ICU service for pressor support.  Patient was able to be weaned from vasopressors; and was transreferred back to the hospitalist service on 03/19/2020.  Acute  cholecystitis Patient presenting to ED with right upper quadrant pain.  Imaging notable for distended gallbladder with layering gallstone and mild pericholestatic stranding.  Given severity of illness requiring ICU level of care and pressure support, patient underwent percutaneous cholecystostomy by IR on 03/16/2020. --WBC 21.2>>>12.7>10.6>10.3>10.2 --IV zosyn transitioned to Augmentin; to complete 14 day course (End: 11/15) --Continue cholecystostomy to gravity, flush 36m TID; 175mout past 24h  Acute on chronic combined systolic/diastolic congestive heart failure; decompensated with associated cardiogenic shock Patient presenting with septic shock as above with associated cholecystitis with likely underlying cardiogenic shock requiring vasopressors and ventilatory support, extubated on 03/17/2020 and weaned off vasopressors.  TTE with severe biventricular dysfunction with LVEF 20% with severely dilated RA, moderately dilated LA. Repeat limited echocardiogram 03/26/2020 shows improvement of LVEF to 55-60%, with grade 2 diastolic dysfunction. --Cardiology following, appreciate assistance --net negative [id number] 24hrs and net negative 4.4L since admission --continue isosorbide mononitrate 30 mg p.o. daily --Hydralazine 37.5 mg q8h --avoiding ACEi/ARB 2/2 AKI, and BB 2/2 bradycardia  Macrocytic Anemia/iron deficiency:  UGIB 2/2 stress gastritis --Iron 42, TIBC 253, Ferritin 118, foalte 16.6, B12 888, FOBT +  --Hgb 13.9>>>10.5>9.3>9.4>7.8>6.4>7.6>9.2>9.3>8.8>8.6>9.1>8.2>8.3 --s/p IV Feraheme 03/22/2020; continue home ferrous sulfate 325m53m daily --Protonix 40 mg PO daily --Carafate 1 g p.o. TIDAC/HS; new until discharge per GI --famotidine 20mg54mq12h --transfused 2u pRBC 11/11; 1u pRBC 11/12 --[facility] consulted, EGD 11/15 with no active bleeding, erythematous mucosa gastric fundus consistent with resolving stress gastritis. --CBC daily; transfuse to maintain hemoglobin greater than  8  Hyperkalemia: Resolved Potassium 5.0 today, s/p 10g Lokelma on 11/11 and 5g Lokelma 11/12 --  Continue monitor on telemetry --Repeat electrolytes in a.m.  Acute renal failure on CKD stage IIIb: Presented with creatinine 3.46 with baseline roughly 1.6. etiology likely secondary to prerenal failure from dehydration, sepsis, and shock as above.  Now appears back to baseline. --Cr 3.45>>>1.25>1.21>1.44>1.34>1.18 --Avoid nephrotoxins, renally dose all medications --Closely monitor renal function daily.  Chronic permanent atrial fibrillation Currently rate controlled.  On Coumadin for anticoagulation at home.  Was also on IV heparin for NSTEMI during hospitalization.  Hospital course remarkable for bradycardia cardiac episodes and amiodarone was discontinued.  Not on beta-blockers. --INR 1.3 today; continue to hold Coumadin 2/2 UGIB  Acute metabolic encephalopathy: Secondary to sepsis.  Currently alert and oriented.He is hard of hearing  Cough:  Most likely secondary to congestive heart failure.  No signs of pneumonia on chest xray.  Cough has improved.  Type II NSTEMI/elevated troponin:  Supply demand ischemia from sepsis/septic shock.  Cardiology were consulted with no plan for any intervention.  Urinary retention: resolved. Continue intermittent straight cath if needed. Started on flomax  Coronary artery disease:  No complaint of chest pain.  Continue Imdur.  Follow-up with cardiology as an outpatient. Status post CABG and PCI with stents.   --Holding coumadin as above  Hypertension:  Currently blood pressure stable.  Continue hydralazine, Imdur, aspirin and statin  Hyperlipidemia: On Crestor  Diabetes type 2:  Poorly controlled, hemoglobin A1c 12.1.  Diabetic coordinator was following. --Lantus 20 units [place] daily --Resistant NovoLog insulin sliding scale for coverage --CBGs qAC/HS  OSA: On CPAP at night  Obesity: BMI of 33.  Counseled on need for weight  loss/lifestyle changes this complicates all facets of care.  Debility/deconditioning/memory deficits:  PT/OT consulted skilled nursing facility on discharge.  Patient lives with his daughter.  On Seroquel at home. Daughter Butch Penny wants to take him home with home health.  Stage II pressure injury, buttocks; POA Pressure Injury 03/14/20 Buttocks Right Stage 2 -  Partial thickness loss of dermis presenting as a shallow open injury with a red, pink wound bed without slough. (Active)  03/14/20 2302  Location: Buttocks  Location Orientation: Right  Staging: Stage 2 -  Partial thickness loss of dermis presenting as a shallow open injury with a red, pink wound bed without slough.  Wound Description (Comments):   Present on Admission: Yes     Pressure Injury 03/14/20 Buttocks Left Stage 2 -  Partial thickness loss of dermis presenting as a shallow open injury with a red, pink wound bed without slough. (Active)  03/14/20 2300  Location: Buttocks  Location Orientation: Left  Staging: Stage 2 -  Partial thickness loss of dermis presenting as a shallow open injury with a red, pink wound bed without slough.  Wound Description (Comments):   Present on Admission: Yes  Wound care consulted and were following.   Continue local wound care, offloading.   DVT prophylaxis: SCDs Code Status: Full code Family Communication: None present at bedside, updated patient's daughter, Butch Penny via telephone this morning.  Disposition Plan:  Status is: Inpatient  Remains inpatient appropriate because:Unsafe d/c plan, IV treatments appropriate due to intensity of illness or inability to take PO and Inpatient level of care appropriate due to severity of illness   Dispo: The patient is from: Home              Anticipated d/c is to: Home              Anticipated d/c date is: 1 day  Patient currently is not medically stable to d/c.  If hemoglobin remains stable next 24 hours, anticipate discharge home with  home health on 03/29/2020.   Consultants:   PCCM  Interventional radiology  General surgery  Cardiology  [facility] gastroenterology, Dr. Paulita Fujita  Procedures:   IR placement cholecystostomy on 03/17/2020  Antimicrobials:   Zosyn 11/2 - 11/5  Augmentin 11/5>> (end 11/15)  Metronidazole 11/1 - 11/2  Cefepime 11/2 -11/2  Ceftriaxone 11/2 - 11/2   Subjective: Patient seen and examined at bedside, resting comfortably.  Epigastric pain and indigestion has resolved.  Hemoglobin 8.3 this morning, stable.  Going down to EGD later this morning.  INR 1.3.  No further dark tarry stools overnight per patient. No other complaints or concerns at this time. Denies headache, no dizziness, no chest pain, no palpitations, no shortness of breath, no abdominal pain.  No acute events overnight per nursing staff.  Objective: Vitals:   03/28/20 0943 03/28/20 0950 03/28/20 0953 03/28/20 0959  BP: (!) 168/49 (!) 148/56 (!) 156/76 (!) 166/51  Pulse: 88 90 90 91  Resp: (!) 28 (!) 25 (!) 24 (!) 22  Temp: 98 F (36.7 C)     TempSrc: Oral     SpO2: 94% 96% 95% 93%  Weight:      Height:        Intake/Output Summary (Last 24 hours) at 03/28/2020 1009 Last data filed at 03/28/2020 0949 Gross per 24 hour  Intake 370 ml  Output 555 ml  Net -185 ml   Filed Weights   03/18/20 0400 03/21/20 1217 03/24/20 0500  Weight: 98.5 kg 102.2 kg 98.4 kg    Examination:  General exam: Appears calm and comfortable, chronically ill in appearance Respiratory system: Clear to auscultation. Respiratory effort normal.  On 1 L nasal cannula with SPO2 96% Cardiovascular system: S1 & S2 heard, RRR. No JVD, murmurs, rubs, gallops or clicks. No pedal edema. Gastrointestinal system: Abdomen is nondistended, soft, NTTP, cholecystostomy site noted with dressing in place, clean/dry/intact; but do note bloody drainage in cholecystostomy bag. No organomegaly or masses felt. Normal bowel sounds heard. Central nervous system:  Alert and oriented. No focal neurological deficits Extremities: Symmetric 5 x 5 power. Skin: No rashes, lesions or ulcers Psychiatry: Judgement and insight appear poor. Mood & affect appropriate.     Data Reviewed: I have personally reviewed following labs and imaging studies  CBC: Recent Labs  Lab 03/23/20 0022 03/23/20 0022 03/24/20 0028 03/24/20 0543 03/25/20 0033 03/25/20 0017 03/26/20 0057 03/26/20 1415 03/27/20 0035 03/27/20 1449 03/28/20 0145  WBC 12.3*   < > 13.1*  --  12.7*  --  10.6*  --  10.3  --  10.2  NEUTROABS 9.3*  --   --   --   --   --   --   --   --   --   --   HGB 7.8*   < > 6.5*   < > 7.6*   < > 8.6* 9.1* 8.2* 8.7* 8.3*  HCT 25.1*   < > 21.3*   < > 23.2*   < > 26.4* 29.1* 25.6* 27.4* 26.5*  MCV 108.2*   < > 108.7*  --  103.6*  --  101.9*  --  103.6*  --  106.4*  PLT 257   < > 275  --  254  --  250  --  264  --  258   < > = values in this interval not displayed.  Basic Metabolic Panel: Recent Labs  Lab 03/24/20 0028 03/25/20 0033 03/26/20 0057 03/27/20 0035 03/28/20 0145  NA 134* 135 135 136 134*  K 5.7* 5.2* 4.6 4.9 5.0  CL 100 100 99 102 99  CO2 _0 GLUCOSE 138* 140* 94 128* 98  BUN 58* 61* 53* 44* 35*  CREATININE 1.21 1.44* 1.34* 1.18 1.18  CALCIUM 8.9 8.8* 8.7* 8.7* 8.5*   GFR: Estimated Creatinine Clearance: 58.7 mL/min (by C-G formula based on SCr of 1.18 mg/dL). Liver Function Tests: Recent Labs  Lab 03/24/20 0028  AST 22  ALT 30  ALKPHOS 73  BILITOT 0.5  PROT 6.0*  ALBUMIN 2.0*   No results for input(s): LIPASE, AMYLASE in the last 168 hours. No results for input(s): AMMONIA in the last 168 hours. Coagulation Profile: Recent Labs  Lab 03/24/20 0028 03/25/20 0033 03/26/20 0057 03/27/20 0035 03/28/20 0145  INR 2.5* 2.3* 1.7* 1.5* 1.3*   Cardiac Enzymes: No results for input(s): CKTOTAL, CKMB, CKMBINDEX, TROPONINI in the last 168 hours. BNP (last 3 results) No results for input(s): PROBNP in the last 8760  hours. HbA1C: No results for input(s): HGBA1C in the last 72 hours. CBG: Recent Labs  Lab 03/27/20 1619 03/27/20 1917 03/27/20 2325 03/28/20 0315 03/28/20 0739  GLUCAP 169* 170* 103* 105* 127*   Lipid Profile: No results for input(s): CHOL, HDL, LDLCALC, TRIG, CHOLHDL, LDLDIRECT in the last 72 hours. Thyroid Function Tests: No results for input(s): TSH, T4TOTAL, FREET4, T3FREE, THYROIDAB in the last 72 hours. Anemia Panel: No results for input(s): VITAMINB12, FOLATE, FERRITIN, TIBC, IRON, RETICCTPCT in the last 72 hours. Sepsis Labs: No results for input(s): PROCALCITON, LATICACIDVEN in the last 168 hours.  No results found for this or any previous visit (from the past 240 hour(s)).       Radiology Studies: ECHOCARDIOGRAM LIMITED  Result Date: 03/26/2020    ECHOCARDIOGRAM LIMITED REPORT   Patient Name:   Isaiah [name] [facility] Date of Exam: 03/26/2020 Medical Rec #:  [id number]      Height:       69.0 in Accession #:    [id number]     Weight:       216.9 lb Date of Birth:  September 10, 1940      BSA:          2.139 m Patient Age:    35 years       BP:           124/50 mmHg Patient Gender: M              HR:           70 bpm. Exam Location:  Inpatient Procedure: Limited Echo, Cardiac Doppler, Color Doppler and Intracardiac            Opacification Agent Indications:    CHF  History:        Patient has prior history of Echocardiogram examinations, most                 recent 03/16/2020. CHF, Previous Myocardial Infarction and CAD,                 PAD, Arrythmias:Atrial Fibrillation and Cardiac Arrest; Risk                 Factors:Hypertension, Dyslipidemia and Morbid obesity.  Sonographer:    Dustin Flock Referring Phys: [phone] MIHAI CROITORU  Sonographer Comments: Patient is morbidly obese. Image acquisition challenging due to patient body habitus.  IMPRESSIONS  1. Limited study; all views not obtained; akinesis of the distal inferolateral wall with overall normal LV systolic function.  2. Left  ventricular ejection fraction, by estimation, is 55 to 60%. The left ventricle has normal function. The left ventricle demonstrates regional wall motion abnormalities (see scoring diagram/findings for description). There is mild left ventricular  hypertrophy. Left ventricular diastolic parameters are consistent with Grade II diastolic dysfunction (pseudonormalization). Elevated left atrial pressure.  3. Right ventricular systolic function is normal. The right ventricular size is mildly enlarged. There is severely elevated pulmonary artery systolic pressure.  4. Right atrial size was mildly dilated.  5. The mitral valve is normal in structure. Mild mitral valve regurgitation. No evidence of mitral stenosis.  6. The aortic valve was not assessed. No aortic stenosis is present. FINDINGS  Left Ventricle: Left ventricular ejection fraction, by estimation, is 55 to 60%. The left ventricle has normal function. The left ventricle demonstrates regional wall motion abnormalities. Definity contrast agent was given IV to delineate the left ventricular endocardial borders. The left ventricular internal cavity size was normal in size. There is mild left ventricular hypertrophy. Left ventricular diastolic parameters are consistent with Grade II diastolic dysfunction (pseudonormalization). Elevated left atrial pressure. Right Ventricle: The right ventricular size is mildly enlarged.Right ventricular systolic function is normal. There is severely elevated pulmonary artery systolic pressure. Left Atrium: Left atrial size was normal in size. Right Atrium: Right atrial size was mildly dilated. Pericardium: There is no evidence of pericardial effusion. Mitral Valve: The mitral valve is normal in structure. Mild mitral annular calcification. Mild mitral valve regurgitation. No evidence of mitral valve stenosis. Tricuspid Valve: The tricuspid valve is normal in structure. Tricuspid valve regurgitation is mild . No evidence of tricuspid  stenosis. Aortic Valve: The aortic valve was not assessed. No aortic stenosis is present. Pulmonic Valve: The pulmonic valve was not assessed. Aorta: The aortic root is normal in size and structure. Venous: The inferior vena cava was not well visualized.  Additional Comments: Limited study; all views not obtained; akinesis of the distal inferolateral wall with overall normal LV systolic function. LEFT VENTRICLE PLAX 2D LVIDd:         4.70 cm Diastology LVIDs:         3.85 cm LV e' medial:    5.77 cm/s LV PW:         1.20 cm LV E/e' medial:  23.1 LV IVS:        1.10 cm LV e' lateral:   11.40 cm/s                        LV E/e' lateral: 11.7  RIGHT VENTRICLE RV S prime:     6.53 cm/s AORTIC VALVE LVOT Vmax:   77.50 cm/s LVOT Vmean:  51.000 cm/s LVOT VTI:    0.168 m MITRAL VALVE                TRICUSPID VALVE MV Area (PHT): 4.80 cm     TR Peak grad:   76.4 mmHg MV Decel Time: 158 msec     TR Vmax:        437.00 cm/s MV E velocity: 133.00 cm/s MV A velocity: 70.00 cm/s   SHUNTS MV E/A ratio:  1.90         Systemic VTI: 0.17 m Kirk Ruths MD Electronically signed by Kirk Ruths MD Signature Date/Time: 03/26/2020/4:02:54 PM    Final  Scheduled Meds: . sodium chloride   Intravenous Once  . acetaminophen (TYLENOL) oral liquid 160 mg/5 mL  650 mg Oral Q4H  . acetaminophen  650 mg Rectal Q4H  . allopurinol  100 mg Oral BID  . amoxicillin-clavulanate  1 tablet Oral Q12H  . chlorhexidine gluconate (MEDLINE KIT)  15 mL Mouth Rinse BID  . feeding supplement  1 Container Oral TID BM  . ferrous sulfate  325 mg Oral Q breakfast  . gabapentin  300 mg Oral Q12H  . hydrALAZINE  37.5 mg Oral Q8H  . insulin aspart  0-20 Units Subcutaneous Q4H  . insulin glargine  20 Units Subcutaneous Daily  . isosorbide mononitrate  30 mg Oral Daily  . mouth rinse  15 mL Mouth Rinse BID  . pantoprazole (PROTONIX) IV  40 mg Intravenous Q12H  . QUEtiapine  50 mg Oral Q supper  . rosuvastatin  10 mg Oral QHS  . sodium  chloride flush  3 mL Intravenous Q12H  . sodium chloride flush  5 mL Intracatheter Q8H  . sucralfate  1 g Oral TID WC & HS  . tamsulosin  0.4 mg Oral QPC supper   Continuous Infusions: . sodium chloride Stopped (03/17/20 2016)  . famotidine (PEPCID) IV Stopped (03/28/20 0710)     LOS: 14 days    Time spent: 35 minutes spent on chart review, discussion with nursing staff, consultants, updating family and interview/physical exam; more than 50% of that time was spent in counseling and/or coordination of care.    Blessyn [place]merville J [place] DO [facility] Available via Epic secure chat 7am-7pm After these hours, please refer to coverage provider listed on [email or web address] 03/28/2020, 10:09 AM

## 2020-03-28 NOTE — Anesthesia Postprocedure Evaluation (Signed)
Anesthesia Post Note  Patient: ELIGAH ANELLO  Procedure(s) Performed: ESOPHAGOGASTRODUODENOSCOPY (EGD) WITH PROPOFOL (Left )     Patient location during evaluation: Endoscopy Anesthesia Type: MAC Level of consciousness: awake and alert Pain management: pain level controlled Vital Signs Assessment: post-procedure vital signs reviewed and stable Respiratory status: spontaneous breathing, nonlabored ventilation and respiratory function stable Cardiovascular status: stable and blood pressure returned to baseline Postop Assessment: no apparent nausea or vomiting Anesthetic complications: no   No complications documented.  Last Vitals:  Vitals:   03/28/20 0953 03/28/20 0959  BP: (!) 156/76 (!) 166/51  Pulse: 90 91  Resp: (!) 24 (!) 22  Temp:    SpO2: 95% 93%    Last Pain:  Vitals:   03/28/20 0959  TempSrc:   PainSc: 0-No pain                 Kela Baccari,W. EDMOND

## 2020-03-28 NOTE — Progress Notes (Signed)
PT Cancellation Note  Patient Details Name: Isaiah [name] MRN: [id number] DOB: 10-18-1940   Cancelled Treatment:    Reason Eval/Treat Not Completed: Patient at procedure or test/unavailable. Pt off the floor at procedure. PT to return as able to progress mobility.  Lewis Shock, PT, DPT Acute Rehabilitation Services Pager #: [phone] Office #: [phone]    Isaiah [name] 03/28/2020, 9:17 AM

## 2020-03-28 NOTE — Plan of Care (Signed)

## 2020-03-28 NOTE — Progress Notes (Signed)
Patient's upper endoscopy was well-tolerated.  Please see dictated report for findings and recommendations.  The patient had an essentially normal exam, just a little bit of proximal gastric erythema suggestive of a resolving stress gastritis.  He does not appear to have any findings that would put him at risk for further bleeding, so resumption of anticoagulation is okay from GI tract standpoint, if felt to be clinically appropriate from cardiology standpoint.  I have placed the patient on a heart healthy diet, and switched his pantoprazole to oral administration, once daily dosing, as he was prior to admission.  I would recommend continuing sucralfate for the duration of his hospitalization but I do not think he will need the sucralfate after discharge.  We will sign off.  Please call us if you have further questions pertaining to this patient's case.  Florencia Reasons, M.D. Pager [phone] If no answer or after 5 PM call [phone]

## 2020-03-29 DIAGNOSIS — I5021 Acute systolic (congestive) heart failure: Secondary | ICD-10-CM | POA: Diagnosis not present

## 2020-03-29 DIAGNOSIS — N1832 Chronic kidney disease, stage 3b: Secondary | ICD-10-CM | POA: Diagnosis not present

## 2020-03-29 DIAGNOSIS — I482 Chronic atrial fibrillation, unspecified: Secondary | ICD-10-CM | POA: Diagnosis not present

## 2020-03-29 DIAGNOSIS — A419 Sepsis, unspecified organism: Secondary | ICD-10-CM | POA: Diagnosis not present

## 2020-03-29 LAB — POTASSIUM
Potassium: 5.8 mmol/L — ABNORMAL HIGH (ref 3.5–5.1)
Potassium: 5.4 mmol/L — ABNORMAL HIGH (ref 3.5–5.1)

## 2020-03-29 LAB — BASIC METABOLIC PANEL
Anion gap: 7 (ref 5–15)
BUN: 38 mg/dL — ABNORMAL HIGH (ref 8–23)
CO2: 29 mmol/L (ref 22–32)
Calcium: 8.7 mg/dL — ABNORMAL LOW (ref 8.9–10.3)
Chloride: 98 mmol/L (ref 98–111)
Creatinine, Ser: 1.28 mg/dL — ABNORMAL HIGH (ref 0.61–1.24)
GFR, Estimated: 57 mL/min — ABNORMAL LOW (ref >60)
Glucose, Bld: 160 mg/dL — ABNORMAL HIGH (ref 70–99)
Potassium: 6.2 mmol/L — ABNORMAL HIGH (ref 3.5–5.1)
Sodium: 134 mmol/L — ABNORMAL LOW (ref 135–145)

## 2020-03-29 LAB — CBC
HCT: 27.5 % — ABNORMAL LOW (ref 39.0–52.0)
Hemoglobin: 8.5 g/dL — ABNORMAL LOW (ref 13.0–17.0)
MCH: 33.2 pg (ref 26.0–34.0)
MCHC: 30.9 g/dL (ref 30.0–36.0)
MCV: 107.4 fL — ABNORMAL HIGH (ref 80.0–100.0)
Platelets: 261 10*3/uL (ref 150–400)
RBC: 2.56 MIL/uL — ABNORMAL LOW (ref 4.22–5.81)
RDW: 18.2 % — ABNORMAL HIGH (ref 11.5–15.5)
WBC: 10.2 10*3/uL (ref 4.0–10.5)
nRBC: 0.4 % — ABNORMAL HIGH (ref 0.0–0.2)

## 2020-03-29 LAB — GLUCOSE, CAPILLARY
Glucose-Capillary: 145 mg/dL — ABNORMAL HIGH (ref 70–99)
Glucose-Capillary: 149 mg/dL — ABNORMAL HIGH (ref 70–99)
Glucose-Capillary: 154 mg/dL — ABNORMAL HIGH (ref 70–99)
Glucose-Capillary: 171 mg/dL — ABNORMAL HIGH (ref 70–99)
Glucose-Capillary: 215 mg/dL — ABNORMAL HIGH (ref 70–99)
Glucose-Capillary: 220 mg/dL — ABNORMAL HIGH (ref 70–99)

## 2020-03-29 LAB — PROTIME-INR
INR: 1.3 — ABNORMAL HIGH (ref 0.8–1.2)
Prothrombin Time: 15.9 seconds — ABNORMAL HIGH (ref 11.4–15.2)

## 2020-03-29 MED ORDER — WARFARIN - PHARMACIST DOSING INPATIENT: Freq: Every day | Status: DC

## 2020-03-29 MED ORDER — INSULIN ASPART 100 UNIT/ML ~~LOC~~ SOLN
5.0000 [IU] | Freq: Once | SUBCUTANEOUS | Status: AC
  Administered 2020-03-29: 5 [IU] via INTRAVENOUS

## 2020-03-29 MED ORDER — FAMOTIDINE 20 MG PO TABS
20.0000 mg | ORAL_TABLET | Freq: Two times a day (BID) | ORAL | Status: DC
  Administered 2020-03-29 – 2020-04-05 (×12): 20 mg via ORAL
  Filled 2020-03-29 (×12): qty 1

## 2020-03-29 MED ORDER — SODIUM ZIRCONIUM CYCLOSILICATE 10 G PO PACK
10.0000 g | PACK | Freq: Once | ORAL | Status: AC
  Administered 2020-03-29: 10 g via ORAL
  Filled 2020-03-29: qty 1

## 2020-03-29 MED ORDER — FUROSEMIDE 40 MG PO TABS
40.0000 mg | ORAL_TABLET | Freq: Every morning | ORAL | Status: DC
  Administered 2020-03-29 – 2020-04-04 (×5): 40 mg via ORAL
  Filled 2020-03-29 (×5): qty 1

## 2020-03-29 MED ORDER — WARFARIN SODIUM 5 MG PO TABS
5.0000 mg | ORAL_TABLET | Freq: Once | ORAL | Status: AC
  Administered 2020-03-29: 5 mg via ORAL
  Filled 2020-03-29: qty 1

## 2020-03-29 MED ORDER — DEXTROSE 50 % IV SOLN
1.0000 | Freq: Once | INTRAVENOUS | Status: AC
  Administered 2020-03-29: 50 mL via INTRAVENOUS
  Filled 2020-03-29: qty 50

## 2020-03-29 MED ORDER — SODIUM ZIRCONIUM CYCLOSILICATE 5 G PO PACK
5.0000 g | PACK | Freq: Once | ORAL | Status: AC
  Administered 2020-03-29: 5 g via ORAL
  Filled 2020-03-29: qty 1

## 2020-03-29 NOTE — Progress Notes (Addendum)
ANTICOAGULATION CONSULT NOTE - Follow-Up  Pharmacy Consult for warfarin Indication: atrial fibrillation  No Known Allergies  Patient Measurements: Height: 5\' 9"  (175.3 cm) Weight: 98.4 kg (216 lb 14.9 oz) IBW/kg (Calculated) : 70.7 Heparin Dosing Weight: 90.1 kg  Vital Signs: Temp: 98.8 F (37.1 C) (11/16 1130) Temp Source: Axillary (11/16 1130) BP: 129/67 (11/16 1130) Pulse Rate: 76 (11/16 1130)  Labs: Recent Labs    03/27/20 0035 03/27/20 0035 03/27/20 1449 03/27/20 1449 03/28/20 0145 03/29/20 0116  HGB 8.2*   < > 8.7*   < > 8.3* 8.5*  HCT 25.6*   < > 27.4*  --  26.5* 27.5*  PLT 264  --   --   --  258 261  LABPROT 17.1*  --   --   --  15.6* 15.9*  INR 1.5*  --   --   --  1.3* 1.3*  CREATININE 1.18  --   --   --  1.18 1.28*   < > = values in this interval not displayed.    Estimated Creatinine Clearance: 54.1 mL/min (A) (by C-G formula based on SCr of 1.28 mg/dL (H)).   Medical History: Past Medical History:  Diagnosis Date  . Atrial fibrillation (HCC)    on Coumadin  . CAD (coronary artery disease)    s/p remote CABG, stent in 2019  . CHF (congestive heart failure) (HCC)   . Dementia (HCC)   . Diabetes mellitus   . DVT (deep venous thrombosis) (HCC)   . Fall at home 10/2015  . Hyperlipidemia   . Hypertension   . Left-sided carotid artery disease (HCC)   . MRSA infection   . Myocardial infarction (HCC)   . Obesity   . Peripheral vascular disease (HCC)   . Sleep apnea   . Stroke (HCC)   . Stroke (HCC)   . Venous insufficiency      Assessment: 19 you male with a history of PVD, CAD, HTN, HLD, DM and afib on warfarin presents with an NSTEMI. Pharmacy was consulted to dose heparin. PTA the patient was on warfarin at 5 mg daily with an INR goal of 2-3. It is unknown whether or not the patient was therapeutic while on this dose.  Endoscopy 11/15 showed no active bleeding, possible gastritis. GI added Carafate and patient cleared for anticoagulation.    INR low at 1.3, Hgb stable in 8s overnight. No active bleeding noted.   Goal of Therapy:  INR 2-3 Monitor platelets by anticoagulation protocol: Yes   Plan:  Restart patient at prior to admit warfarin dose - Warfarin 5 mg tonight Monitor for signs and symptoms of bleeding Monitor daily PT/INR and CBC  12/15 PharmD., BCPS Clinical Pharmacist 03/29/2020 1:44 PM

## 2020-03-29 NOTE — Progress Notes (Addendum)
PROGRESS NOTE    Isaiah [name]  EXB:[id number] DOB: 1940-05-30 DOA: 03/14/2020 PCP: [name], [name], MD    Brief Narrative:  Isaiah [name] is a 79 year old male with past medical history notable for peripheral vascular disease, CAD, essential hypertension, hyperlipidemia, type 2 diabetes mellitus, atrial fibrillation on Coumadin, chronic systolic congestive heart failure who presented to the ED with weakness and a pressure ulcer on the buttock.  Patient also complaining of right upper quadrant pain.  In the ED, patient noted to be febrile with a leukocytosis.  Elevated creatinine.  CT abdomen/pelvis concerning for acute cholecystitis.  Patient was started on broad-spectrum antibiotics and IV fluids.  General surgery was consulted.  Hospital service consulted for admission for severe sepsis secondary to acute cholecystitis.   Assessment & Plan:   Principal Problem:   Sepsis due to undetermined organism [facility]) Active Problems:   Coronary artery disease   Chronic atrial fibrillation (HCC)   Essential hypertension   Hyperlipidemia   Type 2 diabetes mellitus with vascular disease (HCC)   Obstructive sleep apnea   CKD (chronic kidney disease), stage III ([place])   ARF (acute renal failure) (HCC)   Pressure injury of skin   Acute cholecystitis   Class 1 obesity due to excess calories with body mass index (BMI) of 33.0 to 33.9 in adult   [facility])   Cardiac arrest ([place])   Acute systolic heart failure (HCC)   Biventricular failure ([place])   Anemia due to GI blood loss   Severe sepsis with septic shock Patient presenting to the ED with fever, leukocytosis, renal failure and encephalopathy.  Shortly following admission, patient became hypotensive despite aggressive IV fluid resuscitation and was transferred to the ICU service for pressor support.  Patient was able to be weaned from vasopressors; and was transreferred back to the hospitalist service on 03/19/2020.  Acute  cholecystitis Patient presenting to ED with right upper quadrant pain.  Imaging notable for distended gallbladder with layering gallstone and mild pericholestatic stranding.  Given severity of illness requiring ICU level of care and pressure support, patient underwent percutaneous cholecystostomy by IR on 03/16/2020. --WBC 21.2>>>12.7>10.6>10.3>10.2 --IV zosyn transitioned to Augmentin; to complete 14 day course (End: 11/15) --Continue cholecystostomy to gravity, flush 63m TID; 111mout past 24h  Acute on chronic combined systolic/diastolic congestive heart failure; decompensated with associated cardiogenic shock/PEA cardiac arrect Patient presenting with septic shock as above with associated cholecystitis with likely underlying cardiogenic shock requiring vasopressors and ventilatory support, with subsequent PEA arrest in the setting of percutaneous placement of gallbladder drain.  Extubated on 03/17/2020 and weaned off vasopressors.  TTE with severe biventricular dysfunction with LVEF 20% with severely dilated RA, moderately dilated LA. Repeat limited echocardiogram 03/26/2020 shows improvement of LVEF to 55-60%, with grade 2 diastolic dysfunction. --Cardiology following, appreciate assistance --net negative [id number] 24hrs and net negative 5.3L since admission --continue isosorbide mononitrate 30 mg p.o. daily --Hydralazine 37.5 mg q8h --restart furosemide 21m54m daily today --avoiding ACEi/ARB 2/2 AKI, and BB 2/2 bradycardia  Macrocytic Anemia/iron deficiency:  UGIB 2/2 stress gastritis --Iron 42, TIBC 253, Ferritin 118, foalte 16.6, B12 888, FOBT +  --Hgb 13.9>>>10.5>9.3>9.4>7.8>6.4>7.6>9.2>9.3>8.8>8.6>9.1>8.2>8.3>8.5 --s/p IV Feraheme 03/22/2020; continue home ferrous sulfate 325mg93mdaily --Protonix 40 mg PO daily --Carafate 1 g p.o. TIDAC/HS; continue  until discharge per GI --famotidine 20mg 69mID --transfused 2u pRBC 11/11; 1u pRBC 11/12 --[facility] consulted, EGD 11/15 with no  active bleeding, erythematous mucosa gastric fundus consistent with resolving stress gastritis. --CBC  daily; transfuse to maintain hemoglobin greater than 8 --restart coumadin today per GI and cardiology recommendations w/o loading  Hyperkalemia:  Potassium 5.8 today, s/p 10g Lokelma on 11/11, 5g Lokelma 11/12 and 11/15 and 10g Lokelma 10/16 --Continue monitor on telemetry --restarting furosemide as above --Repeat electrolytes in a.m.  Acute renal failure on CKD stage IIIb: Presented with creatinine 3.46 with baseline roughly 1.6. etiology likely secondary to prerenal failure from dehydration, sepsis, and shock as above.  Now appears back to baseline. --Cr 3.45>>>1.25>1.21>1.44>1.34>1.18>1.28 --Avoid nephrotoxins, renally dose all medications --Closely monitor renal function daily.  Chronic permanent atrial fibrillation Currently rate controlled.  On Coumadin for anticoagulation at home.  Was also on IV heparin for NSTEMI during hospitalization.  Hospital course remarkable for bradycardia cardiac episodes and amiodarone was discontinued.  Not on beta-blockers. --INR 1.3 today --restart coumadin today per GI and cardiology recommendations w/o loading  Acute metabolic encephalopathy: Secondary to sepsis.  Currently alert and oriented.He is hard of hearing  Cough:  Most likely secondary to congestive heart failure.  No signs of pneumonia on chest xray.  Cough has improved.  Type II NSTEMI/elevated troponin:  Supply demand ischemia from sepsis/septic shock.  Cardiology followed with no plan for any intervention.  Urinary retention: resolved. Continue intermittent straight cath if needed. Started on flomax  Coronary artery disease s/p CABG/PCI with stents:  No complaint of chest pain.  Continue Imdur.  Follow-up with cardiology as an outpatient. --restarting coumadin as above  Hypertension:  Currently blood pressure stable.  Continue hydralazine, Imdur, aspirin and  statin  Hyperlipidemia: On Crestor  Diabetes type 2:  Poorly controlled, hemoglobin A1c 12.1.  Diabetic coordinator was following. --Lantus 20 units [place] daily --Resistant NovoLog insulin sliding scale for coverage --CBGs qAC/HS  OSA: On CPAP at night  Obesity: BMI of 33.  Counseled on need for weight loss/lifestyle changes this complicates all facets of care.  Debility/deconditioning/memory deficits:  PT/OT consulted skilled nursing facility on discharge.  Patient lives with his daughter.  On Seroquel at home. Daughter Butch Penny wants to take him home with home health.  Stage II pressure injury, buttocks; POA Pressure Injury 03/14/20 Buttocks Right Stage 2 -  Partial thickness loss of dermis presenting as a shallow open injury with a red, pink wound bed without slough. (Active)  03/14/20 2302  Location: Buttocks  Location Orientation: Right  Staging: Stage 2 -  Partial thickness loss of dermis presenting as a shallow open injury with a red, pink wound bed without slough.  Wound Description (Comments):   Present on Admission: Yes     Pressure Injury 03/14/20 Buttocks Left Stage 2 -  Partial thickness loss of dermis presenting as a shallow open injury with a red, pink wound bed without slough. (Active)  03/14/20 2300  Location: Buttocks  Location Orientation: Left  Staging: Stage 2 -  Partial thickness loss of dermis presenting as a shallow open injury with a red, pink wound bed without slough.  Wound Description (Comments):   Present on Admission: Yes     Pressure Injury 03/28/20 Ear Right Stage 2 -  Partial thickness loss of dermis presenting as a shallow open injury with a red, pink wound bed without slough. (Active)  03/28/20 1100  Location: Ear  Location Orientation: Right  Staging: Stage 2 -  Partial thickness loss of dermis presenting as a shallow open injury with a red, pink wound bed without slough.  Wound Description (Comments):   Present on Admission: No  Wound care  consulted and  were following.   Continue local wound care, offloading.   DVT prophylaxis: SCDs Code Status: Full code Family Communication: None present at bedside, updated patient's daughter, Butch Penny via telephone this morning.  Disposition Plan:  Status is: Inpatient  Remains inpatient appropriate because:Unsafe d/c plan, IV treatments appropriate due to intensity of illness or inability to take PO and Inpatient level of care appropriate due to severity of illness   Dispo: The patient is from: Home              Anticipated d/c is to: Home              Anticipated d/c date is: 1 day              Patient currently is not medically stable to d/c.  Continues with elevated potassium level, receiving treatment.  If stable tomorrow with stable hemoglobin, anticipate discharge home 03/30/2020 with home health.   Consultants:   PCCM  Interventional radiology  General surgery  Cardiology  [facility] gastroenterology, Dr. Paulita Fujita, Dr. Cristina Gong  Procedures:   IR placement cholecystostomy on 03/17/2020  EGD 11/15  Antimicrobials:   Zosyn 11/2 - 11/5  Augmentin 11/5 - 11/15  Metronidazole 11/1 - 11/2  Cefepime 11/2 -11/2  Ceftriaxone 11/2 - 11/2   Subjective: Patient seen and examined at bedside, resting comfortably.  Continues with mild indigestion.  Reports poor oral intake.  Potassium early this morning 6.2, given insulin/D50 IV and 5 g Lokelma.  Hemoglobin 8.5, stable.  Continues with profound weakness/fatigue.  No other complaints or concerns at this time. Denies headache, no dizziness, no chest pain, no palpitations, no shortness of breath, no abdominal pain.  No acute events overnight per nursing staff.  Objective: Vitals:   03/28/20 2127 03/28/20 2334 03/29/20 0300 03/29/20 0801  BP: (!) 144/56 (!) 146/56 (!) 146/48 (!) 130/54  Pulse:  65 69 63  Resp:  19 (!) 22 19  Temp:  98.3 F (36.8 C) 98.5 F (36.9 C) 98.3 F (36.8 C)  TempSrc:  Oral Oral Axillary  SpO2:  94%  94% 95%  Weight:      Height:        Intake/Output Summary (Last 24 hours) at 03/29/2020 1016 Last data filed at 03/29/2020 0512 Gross per 24 hour  Intake 310 ml  Output 1350 ml  Net -1040 ml   Filed Weights   03/18/20 0400 03/21/20 1217 03/24/20 0500  Weight: 98.5 kg 102.2 kg 98.4 kg    Examination:  General exam: Appears calm and comfortable, chronically ill in appearance Respiratory system: Clear to auscultation. Respiratory effort normal.  On 2 L nasal cannula with SPO2 94% Cardiovascular system: S1 & S2 heard, RRR. No JVD, murmurs, rubs, gallops or clicks. No pedal edema. Gastrointestinal system: Abdomen is nondistended, soft, NTTP, cholecystostomy site noted with dressing in place, clean/dry/intact; but do note bloody drainage in cholecystostomy bag. No organomegaly or masses felt. Normal bowel sounds heard. Central nervous system: Alert and oriented. No focal neurological deficits Extremities: Symmetric 5 x 5 power. Skin: No rashes, lesions or ulcers Psychiatry: Judgement and insight appear poor. Mood & affect appropriate.     Data Reviewed: I have personally reviewed following labs and imaging studies  CBC: Recent Labs  Lab 03/23/20 0022 03/24/20 0028 03/25/20 0033 03/25/20 1517 03/26/20 0057 03/26/20 0057 03/26/20 1415 03/27/20 0035 03/27/20 1449 03/28/20 0145 03/29/20 0116  WBC 12.3*   < > 12.7*  --  10.6*  --   --  10.3  --  10.2 10.2  NEUTROABS 9.3*  --   --   --   --   --   --   --   --   --   --   HGB 7.8*   < > 7.6*   < > 8.6*   < > 9.1* 8.2* 8.7* 8.3* 8.5*  HCT 25.1*   < > 23.2*   < > 26.4*   < > 29.1* 25.6* 27.4* 26.5* 27.5*  MCV 108.2*   < > 103.6*  --  101.9*  --   --  103.6*  --  106.4* 107.4*  PLT 257   < > 254  --  250  --   --  264  --  258 261   < > = values in this interval not displayed.   Basic Metabolic Panel: Recent Labs  Lab 03/25/20 0033 03/25/20 0033 03/26/20 0057 03/27/20 0035 03/28/20 0145 03/29/20 0116 03/29/20 0811   NA 135  --  135 136 134* 134*  --   K 5.2*   < > 4.6 4.9 5.0 6.2* 5.8*  CL 100  --  99 102 99 98  --   CO2 27  --  '28 29 30 29  ' --   GLUCOSE 140*  --  94 128* 98 160*  --   BUN 61*  --  53* 44* 35* 38*  --   CREATININE 1.44*  --  1.34* 1.18 1.18 1.28*  --   CALCIUM 8.8*  --  8.7* 8.7* 8.5* 8.7*  --    < > = values in this interval not displayed.   GFR: Estimated Creatinine Clearance: 54.1 mL/min (A) (by C-G formula based on SCr of 1.28 mg/dL (H)). Liver Function Tests: Recent Labs  Lab 03/24/20 0028  AST 22  ALT 30  ALKPHOS 73  BILITOT 0.5  PROT 6.0*  ALBUMIN 2.0*   No results for input(s): LIPASE, AMYLASE in the last 168 hours. No results for input(s): AMMONIA in the last 168 hours. Coagulation Profile: Recent Labs  Lab 03/25/20 0033 03/26/20 0057 03/27/20 0035 03/28/20 0145 03/29/20 0116  INR 2.3* 1.7* 1.5* 1.3* 1.3*   Cardiac Enzymes: No results for input(s): CKTOTAL, CKMB, CKMBINDEX, TROPONINI in the last 168 hours. BNP (last 3 results) No results for input(s): PROBNP in the last 8760 hours. HbA1C: No results for input(s): HGBA1C in the last 72 hours. CBG: Recent Labs  Lab 03/28/20 1623 03/28/20 1953 03/28/20 2349 03/29/20 0326 03/29/20 0726  GLUCAP 160* 176* 150* 154* 149*   Lipid Profile: No results for input(s): CHOL, HDL, LDLCALC, TRIG, CHOLHDL, LDLDIRECT in the last 72 hours. Thyroid Function Tests: No results for input(s): TSH, T4TOTAL, FREET4, T3FREE, THYROIDAB in the last 72 hours. Anemia Panel: No results for input(s): VITAMINB12, FOLATE, FERRITIN, TIBC, IRON, RETICCTPCT in the last 72 hours. Sepsis Labs: No results for input(s): PROCALCITON, LATICACIDVEN in the last 168 hours.  No results found for this or any previous visit (from the past 240 hour(s)).       Radiology Studies: No results found.      Scheduled Meds: . sodium chloride   Intravenous Once  . acetaminophen (TYLENOL) oral liquid 160 mg/5 mL  650 mg Oral Q4H  .  acetaminophen  650 mg Rectal Q4H  . allopurinol  100 mg Oral BID  . chlorhexidine gluconate (MEDLINE KIT)  15 mL Mouth Rinse BID  . famotidine  20 mg Oral BID  . feeding supplement  1 Container Oral TID BM  .  ferrous sulfate  325 mg Oral Q breakfast  . gabapentin  300 mg Oral Q12H  . hydrALAZINE  37.5 mg Oral Q8H  . insulin aspart  0-20 Units Subcutaneous Q4H  . insulin glargine  20 Units Subcutaneous Daily  . isosorbide mononitrate  30 mg Oral Daily  . mouth rinse  15 mL Mouth Rinse BID  . pantoprazole  40 mg Oral Daily  . QUEtiapine  50 mg Oral Q supper  . rosuvastatin  10 mg Oral QHS  . sodium chloride flush  3 mL Intravenous Q12H  . sodium chloride flush  5 mL Intracatheter Q8H  . sodium zirconium cyclosilicate  10 g Oral Once  . sucralfate  1 g Oral TID WC & HS  . tamsulosin  0.4 mg Oral QPC supper   Continuous Infusions: . sodium chloride Stopped (03/17/20 2016)     LOS: 15 days    Time spent: 35 minutes spent on chart review, discussion with nursing staff, consultants, updating family and interview/physical exam; more than 50% of that time was spent in counseling and/or coordination of care.    Haya Hemler J [place] DO [facility] Available via Epic secure chat 7am-7pm After these hours, please refer to coverage provider listed on [email or web address] 03/29/2020, 10:16 AM

## 2020-03-29 NOTE — Progress Notes (Addendum)
Progress Note  Patient Name: Isaiah [name] Date of Encounter: 03/29/2020  [facility] Cardiologist: Quay Burow, MD   Subjective   He has no cardiac complaints.    UGI endoscopy revealed evidence of healing gastritis. Gastritis likely related to stress of acute illness.  Inpatient Medications    Scheduled Meds: . sodium chloride   Intravenous Once  . acetaminophen (TYLENOL) oral liquid 160 mg/5 mL  650 mg Oral Q4H  . acetaminophen  650 mg Rectal Q4H  . allopurinol  100 mg Oral BID  . chlorhexidine gluconate (MEDLINE KIT)  15 mL Mouth Rinse BID  . feeding supplement  1 Container Oral TID BM  . ferrous sulfate  325 mg Oral Q breakfast  . gabapentin  300 mg Oral Q12H  . hydrALAZINE  37.5 mg Oral Q8H  . insulin aspart  0-20 Units Subcutaneous Q4H  . insulin glargine  20 Units Subcutaneous Daily  . isosorbide mononitrate  30 mg Oral Daily  . mouth rinse  15 mL Mouth Rinse BID  . pantoprazole  40 mg Oral Daily  . QUEtiapine  50 mg Oral Q supper  . rosuvastatin  10 mg Oral QHS  . sodium chloride flush  3 mL Intravenous Q12H  . sodium chloride flush  5 mL Intracatheter Q8H  . sucralfate  1 g Oral TID WC & HS  . tamsulosin  0.4 mg Oral QPC supper   Continuous Infusions: . sodium chloride Stopped (03/17/20 2016)  . famotidine (PEPCID) IV 20 mg (03/29/20 0825)   PRN Meds: alum & mag hydroxide-simeth, docusate, Gerhardt's butt cream, guaiFENesin-dextromethorphan, hydrALAZINE, loperamide, ondansetron **OR** ondansetron (ZOFRAN) IV, traMADol   Vital Signs    Vitals:   03/28/20 2127 03/28/20 2334 03/29/20 0300 03/29/20 0801  BP: (!) 144/56 (!) 146/56 (!) 146/48 (!) 130/54  Pulse:  65 69 63  Resp:  19 (!) 22 19  Temp:  98.3 F (36.8 C) 98.5 F (36.9 C) 98.3 F (36.8 C)  TempSrc:  Oral Oral Axillary  SpO2:  94% 94% 95%  Weight:      Height:        Intake/Output Summary (Last 24 hours) at 03/29/2020 0827 Last data filed at 03/29/2020 0034 Gross per 24 hour    Intake 510 ml  Output 1350 ml  Net -840 ml   Last 3 Weights 03/24/2020 03/21/2020 03/18/2020  Weight (lbs) 216 lb 14.9 oz 225 lb 5 oz 217 lb 2.5 oz  Weight (kg) 98.4 kg 102.2 kg 98.5 kg      Telemetry    Heart rate remains flat at around 60 to 65 bpm and what appears to be a regularized ventricular response in the setting of A. fib or an accelerated junctional rhythm. He is hemodynamically stable.- Personally Reviewed  ECG    No recent tracing- Personally Reviewed  Physical Exam  Morbidly obese GEN:  Chronically ill-appearing. In good spirits. Neck: No JVD Cardiac:  No obvious murmur.  Regular rhythm.  No significant murmur. Respiratory:  Anterior auscultation reveals clear lungs. GI:  Soft without evidence of rebound MS:  No edema Neuro:  Nonfocal.  Awakens.  Conversant. Psych: Normal affect   Labs    High Sensitivity Troponin:   Recent Labs  Lab 03/15/20 1705 03/15/20 1916 03/16/20 0008 03/16/20 0309  TROPONINIHS 61* 308* 982* 1,364*      Chemistry Recent Labs  Lab 03/24/20 0028 03/25/20 0033 03/27/20 0035 03/28/20 0145 03/29/20 0116  NA 134*   < > 136 134* 134*  K 5.7*   < > 4.9 5.0 6.2*  CL 100   < > 102 99 98  CO2 26   < > '29 30 29  ' GLUCOSE 138*   < > 128* 98 160*  BUN 58*   < > 44* 35* 38*  CREATININE 1.21   < > 1.18 1.18 1.28*  CALCIUM 8.9   < > 8.7* 8.5* 8.7*  PROT 6.0*  --   --   --   --   ALBUMIN 2.0*  --   --   --   --   AST 22  --   --   --   --   ALT 30  --   --   --   --   ALKPHOS 73  --   --   --   --   BILITOT 0.5  --   --   --   --   GFRNONAA >60   < > >60 >60 57*  ANIONGAP 8   < > '5 5 7   ' < > = values in this interval not displayed.     Hematology Recent Labs  Lab 03/27/20 0035 03/27/20 0035 03/27/20 1449 03/28/20 0145 03/29/20 0116  WBC 10.3  --   --  10.2 10.2  RBC 2.47*  --   --  2.49* 2.56*  HGB 8.2*   < > 8.7* 8.3* 8.5*  HCT 25.6*   < > 27.4* 26.5* 27.5*  MCV 103.6*  --   --  106.4* 107.4*  MCH 33.2  --   --  33.3  33.2  MCHC 32.0  --   --  31.3 30.9  RDW 17.8*  --   --  18.2* 18.2*  PLT 264  --   --  258 261   < > = values in this interval not displayed.    BNP No results for input(s): BNP, PROBNP in the last 168 hours.   DDimer No results for input(s): DDIMER in the last 168 hours.   Radiology    No results found.  Cardiac Studies   No new data  Patient Profile     79 y.o. male 79 y.o. male with acute cholecystitis and PEA arrest, noted to have severe LV dysfunction post-arrest and also elevated troponin consistent with demand ischemia of the myocardium.  Subsequently has demonstrated recovery of LV systolic function but with the development of melena, decreasing hemoglobin, and transfusion requirement.   Assessment & Plan  1. Acute on chronic systolic heart failure, resolved. Continue to follow relative to volume overload. Please note that the patient uses metolazone as needed and was on furosemide 40 mg twice daily as an outpatient. As his appetite improves, diuretic therapy will be necessary. 2. Elevated troponin: Likely demand related in setting of severe stress of bradycardia asystolic cardiac arrest.  Unless he develops symptoms that are compatible with ischemia, an invasive evaluation would not be sought. 3. PEA cardiac arrest occurring in the setting of percutaneous placement of a gallbladder drain. 4. Atrial fibrillation with controlled rate response: CHADS VASC >/= 5. He is at significant increased risk for stroke. Recommend resumption of anticoagulation therapy without loading. 5. History of chronic anticoagulation: INR will need follow-up within 7 to 10 days including a hemoglobin. 6. Melena and anemia: Recheck hemoglobin at first OP INR check in 1 week.     For questions or updates, please contact [place] Please consult [email or web address] for contact info under   [place] will sign  off.   Medication Recommendations:  Resume basal diuretic regimen Other  recommendations (labs, testing, etc):  F/u BMET 7-10 days Follow up as an outpatient:  With PCP and cardiology attending within 2 weeks.      Signed, Sinclair Grooms, MD  03/29/2020, 8:27 AM

## 2020-03-29 NOTE — Progress Notes (Signed)
Pt refuses CPAP for night rest.

## 2020-03-29 NOTE — Progress Notes (Signed)
Physical Therapy Treatment Patient Details Name: Isaiah [name] MRN: [id number] DOB: October 18, 1940 Today's Date: 03/29/2020    History of Present Illness HANNAN HUTMACHER is a 79 y.o. male with medical history significant of PVD; CAD; HTN; HLD; DM; afib on Coumadin; and CHF presenting with weakness and a buttock pressure ulcer. Pt with sepsis and R UQ abdominal pain, found to have cholecystitis. 11/2 Pt was undergoing anesthesia for a perc chole drain and coded. 04-15-2023 perc chole performed. Pt intubated 11/2-11/4    PT Comments    Pt asleep on entry with son in room. Pt easily roused and agreeable to working with therapy. Pt very stiff and gentle ROM performed before coming to sit on EoB. Pt requires total Ax2 for coming to sit EoB. Pt sat for 12-15 min performing AAROM of LE, UE and spine. Pt able to assist in lateral scoot along EoB with maxAx2. Pt requires totalAx2 for return to bed.  Family plan is for pt to discharge to daughters home when medically stable. PT recommending HHPT to aid in progressing mobility. PT will continue to follow acutely.    Follow Up Recommendations  Supervision/Assistance - 24 hour;Home health PT     Equipment Recommendations  Hospital bed       Precautions / Restrictions Precautions Precautions: Fall Precaution Comments: incontinent of bowel, perc chole drain, sacral wound Restrictions Weight Bearing Restrictions: No    Mobility  Bed Mobility Overal bed mobility: Needs Assistance Bed Mobility: Rolling;Sidelying to Sit Rolling: Mod assist   Supine to sit: Total assist Sit to supine: Total assist   General bed mobility comments: total A for coming to EoB and for getting back in bed, mod A for rolling L and R for pad placement  Transfers Overall transfer level: Needs assistance   Transfers: Lateral/Scoot Transfers          Lateral/Scoot Transfers: Max assist;+2 physical assistance General transfer comment: maxAx2 for 2x lateral scoot toward HoB  before returning to supine  Ambulation/Gait             General Gait Details: unable to attempt       Balance Overall balance assessment: Needs assistance Sitting-balance support: Feet supported;Bilateral upper extremity supported Sitting balance-Leahy Scale: Fair Sitting balance - Comments: pt requires modA for balance to begin and then able to progress to min guard, unable to reach outside BoS without assist                                    Cognition Arousal/Alertness: Awake/alert Behavior During Therapy: Flat affect Overall Cognitive Status: Impaired/Different from baseline Area of Impairment: Problem solving;Safety/judgement;Following commands;Orientation                 Orientation Level: Disoriented to;Situation Current Attention Level: Sustained Memory: Decreased short-term memory Following Commands: Follows one step commands with increased time;Follows one step commands inconsistently Safety/Judgement: Decreased awareness of safety;Decreased awareness of deficits   Problem Solving: Slow processing;Decreased initiation;Difficulty sequencing;Requires verbal cues;Requires tactile cues General Comments: pt HOH, does not respond to questions often and when he does it is delayed       Exercises General Exercises - Lower Extremity Long Arc Quad: AAROM;Both;5 reps;Seated Hip Flexion/Marching: Both;5 reps;Seated;AAROM Other Exercises Other Exercises: thoracic rotation across midline x 5 R and L     General Comments General comments (skin integrity, edema, etc.): VSS on 4L O2 via [place]  Pertinent Vitals/Pain Pain Assessment: Faces Faces Pain Scale: Hurts little more Pain Location: generalized moaning with movement Pain Descriptors / Indicators: Grimacing;Moaning Pain Intervention(s): Limited activity within patient's tolerance;Monitored during session;Repositioned           PT Goals (current goals can now be found in the care plan  section) Acute Rehab PT Goals Patient Stated Goal: unable to state PT Goal Formulation: With patient/family Time For Goal Achievement: 03/30/20 Potential to Achieve Goals: Good Progress towards PT goals: Progressing toward goals    Frequency    Min 3X/week      PT Plan Discharge plan needs to be updated       AM-PAC PT "6 Clicks" Mobility   Outcome Measure  Help needed turning from your back to your side while in a flat bed without using bedrails?: Total Help needed moving from lying on your back to sitting on the side of a flat bed without using bedrails?: Total Help needed moving to and from a bed to a chair (including a wheelchair)?: Total Help needed standing up from a chair using your arms (e.g., wheelchair or bedside chair)?: Total Help needed to walk in hospital room?: Total Help needed climbing 3-5 steps with a railing? : Total 6 Click Score: 6    End of Session Equipment Utilized During Treatment: Oxygen Activity Tolerance: Patient tolerated treatment well;Treatment limited secondary to medical complications (Comment) (pt requires blood transfusion ) Patient left: in bed;with call bell/phone within reach;with family/visitor present;with nursing/sitter in room Nurse Communication: Mobility status;Need for lift equipment PT Visit Diagnosis: Unsteadiness on feet (R26.81);Muscle weakness (generalized) (M62.81);Difficulty in walking, not elsewhere classified (R26.2)     Time: 2376-2831 PT Time Calculation (min) (ACUTE ONLY): 20 min  Charges:  $Therapeutic Exercise: 8-22 mins                     Genise Strack B. Beverely Risen PT, DPT Acute Rehabilitation Services Pager [phone] Office [phone]    Elon Alas Fleet 03/29/2020, 4:10 PM

## 2020-03-29 NOTE — Plan of Care (Signed)

## 2020-03-30 DIAGNOSIS — A419 Sepsis, unspecified organism: Secondary | ICD-10-CM | POA: Diagnosis not present

## 2020-03-30 LAB — GLUCOSE, CAPILLARY
Glucose-Capillary: 138 mg/dL — ABNORMAL HIGH (ref 70–99)
Glucose-Capillary: 163 mg/dL — ABNORMAL HIGH (ref 70–99)
Glucose-Capillary: 164 mg/dL — ABNORMAL HIGH (ref 70–99)
Glucose-Capillary: 167 mg/dL — ABNORMAL HIGH (ref 70–99)
Glucose-Capillary: 168 mg/dL — ABNORMAL HIGH (ref 70–99)
Glucose-Capillary: 174 mg/dL — ABNORMAL HIGH (ref 70–99)

## 2020-03-30 LAB — BASIC METABOLIC PANEL
Anion gap: 6 (ref 5–15)
BUN: 40 mg/dL — ABNORMAL HIGH (ref 8–23)
CO2: 30 mmol/L (ref 22–32)
Calcium: 8.9 mg/dL (ref 8.9–10.3)
Chloride: 102 mmol/L (ref 98–111)
Creatinine, Ser: 1.32 mg/dL — ABNORMAL HIGH (ref 0.61–1.24)
GFR, Estimated: 55 mL/min — ABNORMAL LOW (ref >60)
Glucose, Bld: 209 mg/dL — ABNORMAL HIGH (ref 70–99)
Potassium: 5.3 mmol/L — ABNORMAL HIGH (ref 3.5–5.1)
Sodium: 138 mmol/L (ref 135–145)

## 2020-03-30 LAB — CBC
HCT: 25.9 % — ABNORMAL LOW (ref 39.0–52.0)
Hemoglobin: 7.8 g/dL — ABNORMAL LOW (ref 13.0–17.0)
MCH: 33.1 pg (ref 26.0–34.0)
MCHC: 30.1 g/dL (ref 30.0–36.0)
MCV: 109.7 fL — ABNORMAL HIGH (ref 80.0–100.0)
Platelets: 237 10*3/uL (ref 150–400)
RBC: 2.36 MIL/uL — ABNORMAL LOW (ref 4.22–5.81)
RDW: 18.3 % — ABNORMAL HIGH (ref 11.5–15.5)
WBC: 10 10*3/uL (ref 4.0–10.5)
nRBC: 0.5 % — ABNORMAL HIGH (ref 0.0–0.2)

## 2020-03-30 LAB — HEMOGLOBIN AND HEMATOCRIT, BLOOD
HCT: 30.9 % — ABNORMAL LOW (ref 39.0–52.0)
Hemoglobin: 9.6 g/dL — ABNORMAL LOW (ref 13.0–17.0)

## 2020-03-30 LAB — PROTIME-INR
INR: 1.3 — ABNORMAL HIGH (ref 0.8–1.2)
Prothrombin Time: 15.3 seconds — ABNORMAL HIGH (ref 11.4–15.2)

## 2020-03-30 LAB — PREPARE RBC (CROSSMATCH)

## 2020-03-30 MED ORDER — SODIUM ZIRCONIUM CYCLOSILICATE 10 G PO PACK
10.0000 g | PACK | Freq: Once | ORAL | Status: AC
  Administered 2020-03-30: 10 g via ORAL
  Filled 2020-03-30: qty 1

## 2020-03-30 MED ORDER — SODIUM CHLORIDE 0.9% IV SOLUTION: Freq: Once | INTRAVENOUS | Status: AC

## 2020-03-30 MED ORDER — WARFARIN SODIUM 5 MG PO TABS
5.0000 mg | ORAL_TABLET | Freq: Once | ORAL | Status: AC
  Administered 2020-03-30: 5 mg via ORAL
  Filled 2020-03-30: qty 1

## 2020-03-30 MED ORDER — FUROSEMIDE 10 MG/ML IJ SOLN
20.0000 mg | Freq: Once | INTRAMUSCULAR | Status: AC
  Administered 2020-03-30: 20 mg via INTRAVENOUS
  Filled 2020-03-30: qty 2

## 2020-03-30 MED ORDER — PANTOPRAZOLE SODIUM 40 MG PO TBEC: 40.0000 mg | DELAYED_RELEASE_TABLET | Freq: Every day | ORAL | Status: DC

## 2020-03-30 MED ORDER — INSULIN GLARGINE 100 UNIT/ML ~~LOC~~ SOLN
30.0000 [IU] | Freq: Every day | SUBCUTANEOUS | Status: DC
  Administered 2020-03-30 – 2020-04-05 (×7): 30 [IU] via SUBCUTANEOUS
  Filled 2020-03-30 (×7): qty 0.3

## 2020-03-30 MED ORDER — BISACODYL 10 MG RE SUPP
10.0000 mg | Freq: Once | RECTAL | Status: DC
  Filled 2020-03-30: qty 1

## 2020-03-30 MED ORDER — ENSURE ENLIVE PO LIQD
237.0000 mL | Freq: Three times a day (TID) | ORAL | Status: DC
  Administered 2020-03-30 – 2020-04-05 (×15): 237 mL via ORAL

## 2020-03-30 MED ORDER — DOCUSATE SODIUM 50 MG/5ML PO LIQD
100.0000 mg | Freq: Two times a day (BID) | ORAL | Status: DC
  Administered 2020-03-30 – 2020-04-05 (×10): 100 mg via ORAL
  Filled 2020-03-30 (×11): qty 10

## 2020-03-30 NOTE — Progress Notes (Signed)
PROGRESS NOTE    Isaiah [name]  NOB:[id number] DOB: 03-Jun-1940 DOA: 03/14/2020 PCP: [name], [name], MD    Brief Narrative:  Isaiah [name] is a 79 year old male with past medical history notable for peripheral vascular disease, CAD, essential hypertension, hyperlipidemia, type 2 diabetes mellitus, atrial fibrillation on Coumadin, chronic systolic congestive heart failure who presented to the ED with weakness and a pressure ulcer on the buttock.  Patient also complaining of right upper quadrant pain.  In the ED, patient noted to be febrile with a leukocytosis.  Elevated creatinine.  CT abdomen/pelvis concerning for acute cholecystitis.  Patient was started on broad-spectrum antibiotics and IV fluids.  General surgery was consulted.  Hospital service consulted for admission for severe sepsis secondary to acute cholecystitis.   Assessment & Plan:   Principal Problem:   Sepsis due to undetermined organism [facility]) Active Problems:   Coronary artery disease   Chronic atrial fibrillation (HCC)   Essential hypertension   Hyperlipidemia   Type 2 diabetes mellitus with vascular disease (HCC)   Obstructive sleep apnea   CKD (chronic kidney disease), stage III ([place])   ARF (acute renal failure) (HCC)   Pressure injury of skin   Acute cholecystitis   Class 1 obesity due to excess calories with body mass index (BMI) of 33.0 to 33.9 in adult   Shock [facility])   Cardiac arrest ([place])   Acute systolic heart failure (HCC)   Biventricular failure ([place])   Anemia due to GI blood loss   Severe sepsis with septic shock Patient presenting to the ED with fever, leukocytosis, renal failure and encephalopathy.  Shortly following admission, patient became hypotensive despite aggressive IV fluid resuscitation and was transferred to the ICU service for pressor support.  Patient was able to be weaned from vasopressors; and was transreferred back to the hospitalist service on 03/19/2020.  Acute  cholecystitis Patient presenting to ED with right upper quadrant pain.  Imaging notable for distended gallbladder with layering gallstone and mild pericholestatic stranding.  Given severity of illness requiring ICU level of care and pressure support, patient underwent percutaneous cholecystostomy by IR on 03/16/2020. --WBC 21.2>>>12.7>10.6>10.3>10.2>10.0 --IV zosyn transitioned to Augmentin; completed 14 day course 11/15) --Continue cholecystostomy to gravity, flush 58m TID; 21mout past 24h  Acute on chronic combined systolic/diastolic congestive heart failure; decompensated with associated cardiogenic shock/PEA cardiac arrect Patient presenting with septic shock as above with associated cholecystitis with likely underlying cardiogenic shock requiring vasopressors and ventilatory support, with subsequent PEA arrest in the setting of percutaneous placement of gallbladder drain.  Extubated on 03/17/2020 and weaned off vasopressors.  TTE with severe biventricular dysfunction with LVEF 20% with severely dilated RA, moderately dilated LA. Repeat limited echocardiogram 03/26/2020 shows improvement of LVEF to 55-60%, with grade 2 diastolic dysfunction. --Cardiology signed off 11/16 --net negative [id number] 24hrs and net negative 5.7L since admission --continue isosorbide mononitrate 30 mg p.o. daily --Hydralazine 37.5 mg q8h --restarted furosemide 46m41m daily --avoiding ACEi/ARB 2/2 AKI, and BB 2/2 bradycardia  Macrocytic Anemia/iron deficiency:  UGIB 2/2 stress gastritis --Iron 42, TIBC 253, Ferritin 118, foalte 16.6, B12 888, FOBT +  --Hgb 13.9>>>10.5>9.3>9.4>7.8>6.4>7.6>9.2>9.3>8.8>8.6>9.1>8.2>8.3>8.5>7.8 --s/p IV Feraheme 03/22/2020; continue home ferrous sulfate 325mg7mdaily --Protonix 40 mg PO daily --Carafate 1 g p.o. TIDAC/HS; continue  until discharge per GI --famotidine 20mg 79mID --transfused 2u pRBC 11/11; 1u pRBC 11/12, 1u pRBC 11/17 --[facility] consulted, EGD 11/15 with no active  bleeding, erythematous mucosa gastric fundus consistent with resolving stress gastritis. --CBC  daily; transfuse to maintain hemoglobin greater than 8 --restart coumadin ok per GI and cardiology; recommendations w/o loading  Hyperkalemia:  Potassium 5.3 today, s/p 10g Lokelma on 11/11, 5g Lokelma 11/12 and 11/15 and 10g Lokelma 10/16, Lokelna 10g PO 11/17 --Continue monitor on telemetry --restarted furosemide as above --Repeat electrolytes in a.m.  Acute renal failure on CKD stage IIIb: Presented with creatinine 3.46 with baseline roughly 1.6. etiology likely secondary to prerenal failure from dehydration, sepsis, and shock as above.  Now appears back to baseline. --Cr 3.45>>>1.25>1.21>1.44>1.34>1.18>1.28>1.32 --Avoid nephrotoxins, renally dose all medications --Closely monitor renal function daily.  Chronic permanent atrial fibrillation Currently rate controlled.  On Coumadin for anticoagulation at home.  Was also on IV heparin for NSTEMI during hospitalization.  Hospital course remarkable for bradycardia cardiac episodes and amiodarone was discontinued.  Not on beta-blockers. --INR 1.3 today --restarted coumadin 11/16 per GI and cardiology recommendations w/o loading --monitor INR daily  Acute metabolic encephalopathy: Secondary to sepsis.  Currently alert and oriented.He is hard of hearing  Cough:  Most likely secondary to congestive heart failure.  No signs of pneumonia on chest xray.  Cough has improved.  Type II NSTEMI/elevated troponin:  Supply demand ischemia from sepsis/septic shock.  Cardiology followed with no plan for any intervention.  Urinary retention: resolved. Continue intermittent straight cath if needed. Started on flomax  Coronary artery disease s/p CABG/PCI with stents:  No complaint of chest pain.  Continue Imdur.  Follow-up with cardiology as an outpatient. --restarting coumadin as above  Hypertension:  Currently blood pressure stable.  Continue  hydralazine, Imdur, aspirin and statin  Hyperlipidemia: On Crestor  Diabetes type 2:  Poorly controlled, hemoglobin A1c 12.1.  Diabetic coordinator was following. --Lantus 30 units [place] daily --Resistant NovoLog insulin sliding scale for coverage --CBGs qAC/HS  OSA: On CPAP at night  Constipation: Last bowel movement 2 days ago, complains of constipation and mild abdominal discomfort. --Colace 100 mg p.o. twice daily --bisacodyl supp 23m x 1 today --continue to closely monitor BM  Obesity: BMI of 33.  Counseled on need for weight loss/lifestyle changes this complicates all facets of care.  Debility/deconditioning/memory deficits:  PT/OT consulted skilled nursing facility on discharge.  Patient lives with his daughter.  On Seroquel at home. Daughter DButch Pennywants to take him home with home health.  Stage II pressure injury, buttocks; POA Pressure Injury 03/14/20 Buttocks Right Stage 2 -  Partial thickness loss of dermis presenting as a shallow open injury with a red, pink wound bed without slough. (Active)  03/14/20 2302  Location: Buttocks  Location Orientation: Right  Staging: Stage 2 -  Partial thickness loss of dermis presenting as a shallow open injury with a red, pink wound bed without slough.  Wound Description (Comments):   Present on Admission: Yes     Pressure Injury 03/14/20 Buttocks Left Stage 2 -  Partial thickness loss of dermis presenting as a shallow open injury with a red, pink wound bed without slough. (Active)  03/14/20 2300  Location: Buttocks  Location Orientation: Left  Staging: Stage 2 -  Partial thickness loss of dermis presenting as a shallow open injury with a red, pink wound bed without slough.  Wound Description (Comments):   Present on Admission: Yes     Pressure Injury 03/28/20 Ear Right Stage 2 -  Partial thickness loss of dermis presenting as a shallow open injury with a red, pink wound bed without slough. (Active)  03/28/20 1100  Location:  Ear  Location Orientation: Right  Staging: Stage 2 -  Partial thickness loss of dermis presenting as a shallow open injury with a red, pink wound bed without slough.  Wound Description (Comments):   Present on Admission: No  Wound care consulted and were following.   Continue local wound care, offloading.   DVT prophylaxis: SCDs Code Status: Full code Family Communication: None present at bedside, updated patient's daughter, Butch Penny via telephone this morning.  Disposition Plan:  Status is: Inpatient  Remains inpatient appropriate because:Unsafe d/c plan, IV treatments appropriate due to intensity of illness or inability to take PO and Inpatient level of care appropriate due to severity of illness   Dispo: The patient is from: Home              Anticipated d/c is to: Home              Anticipated d/c date is: 1 day              Patient currently is not medically stable to d/c.  Continues with elevated potassium level, receiving treatment.  If stable tomorrow with stable hemoglobin, anticipate discharge home 03/30/2020 with home health.   Consultants:   PCCM  Interventional radiology  General surgery  Cardiology - signed off 11/16  [facility] gastroenterology, Dr. Paulita Fujita, Dr. Cristina Gong  Procedures:   IR placement cholecystostomy on 03/17/2020  EGD 11/15  Antimicrobials:   Zosyn 11/2 - 11/5  Augmentin 11/5 - 11/15  Metronidazole 11/1 - 11/2  Cefepime 11/2 -11/2  Ceftriaxone 11/2 - 11/2   Subjective: Patient seen and examined at bedside, resting comfortably.  Tolerating diet, but continues with poor oral intake.  Reports no bowel movement in the last few days and feels constipated.  Hemoglobin slightly drifting down to 7.8 today, will transfuse.  Continues with profound weakness/fatigue.  No family present at bedside.  Denies headache, no dizziness, no chest pain, palpitations, no shortness of breath, no fever/chills/night sweats, no nausea/vomiting/diarrhea.  No acute  events overnight per nursing staff.  Objective: Vitals:   03/30/20 0353 03/30/20 0400 03/30/20 0622 03/30/20 0700  BP:   133/60 (!) 123/57  Pulse:    79  Resp:    20  Temp: (!) 96.6 F (35.9 C) 97.9 F (36.6 C)  97.8 F (36.6 C)  TempSrc: Oral Oral  Oral  SpO2:    94%  Weight:      Height:        Intake/Output Summary (Last 24 hours) at 03/30/2020 0948 Last data filed at 03/30/2020 0629 Gross per 24 hour  Intake 15 ml  Output 445 ml  Net -430 ml   Filed Weights   03/18/20 0400 03/21/20 1217 03/24/20 0500  Weight: 98.5 kg 102.2 kg 98.4 kg    Examination:  General exam: Appears calm and comfortable, chronically ill in appearance Respiratory system: Clear to auscultation. Respiratory effort normal.  On 2 L nasal cannula with SPO2 92% Cardiovascular system: S1 & S2 heard, RRR. No JVD, murmurs, rubs, gallops or clicks. No pedal edema. Gastrointestinal system: Abdomen is nondistended, soft, NTTP, cholecystostomy site noted with dressing in place, clean/dry/intact; but do note bloody drainage in cholecystostomy bag. No organomegaly or masses felt. Normal bowel sounds heard. Central nervous system: Alert and oriented. No focal neurological deficits Extremities: Symmetric 5 x 5 power. Skin: No rashes, lesions or ulcers Psychiatry: Judgement and insight appear poor. Mood & affect appropriate.     Data Reviewed: I have personally reviewed following labs and imaging studies  CBC: Recent Labs  Lab 03/26/20 0057 03/26/20 1415 03/27/20 0035 03/27/20 1449 03/28/20 0145 03/29/20 0116 03/30/20 0059  WBC 10.6*  --  10.3  --  10.2 10.2 10.0  HGB 8.6*   < > 8.2* 8.7* 8.3* 8.5* 7.8*  HCT 26.4*   < > 25.6* 27.4* 26.5* 27.5* 25.9*  MCV 101.9*  --  103.6*  --  106.4* 107.4* 109.7*  PLT 250  --  264  --  258 261 237   < > = values in this interval not displayed.   Basic Metabolic Panel: Recent Labs  Lab 03/26/20 0057 03/26/20 0057 03/27/20 0035 03/27/20 0035 03/28/20 0145  03/29/20 0116 03/29/20 0811 03/29/20 1701 03/30/20 0059  NA 135  --  136  --  134* 134*  --   --  138  K 4.6   < > 4.9   < > 5.0 6.2* 5.8* 5.4* 5.3*  CL 99  --  102  --  99 98  --   --  102  CO2 28  --  29  --  30 29  --   --  30  GLUCOSE 94  --  128*  --  98 160*  --   --  209*  BUN 53*  --  44*  --  35* 38*  --   --  40*  CREATININE 1.34*  --  1.18  --  1.18 1.28*  --   --  1.32*  CALCIUM 8.7*  --  8.7*  --  8.5* 8.7*  --   --  8.9   < > = values in this interval not displayed.   GFR: Estimated Creatinine Clearance: 52.5 mL/min (A) (by C-G formula based on SCr of 1.32 mg/dL (H)). Liver Function Tests: Recent Labs  Lab 03/24/20 0028  AST 22  ALT 30  ALKPHOS 73  BILITOT 0.5  PROT 6.0*  ALBUMIN 2.0*   No results for input(s): LIPASE, AMYLASE in the last 168 hours. No results for input(s): AMMONIA in the last 168 hours. Coagulation Profile: Recent Labs  Lab 03/26/20 0057 03/27/20 0035 03/28/20 0145 03/29/20 0116 03/30/20 0059  INR 1.7* 1.5* 1.3* 1.3* 1.3*   Cardiac Enzymes: No results for input(s): CKTOTAL, CKMB, CKMBINDEX, TROPONINI in the last 168 hours. BNP (last 3 results) No results for input(s): PROBNP in the last 8760 hours. HbA1C: No results for input(s): HGBA1C in the last 72 hours. CBG: Recent Labs  Lab 03/29/20 1550 03/29/20 1930 03/29/20 2335 03/30/20 0400 03/30/20 0835  GLUCAP 145* 215* 220* 138* 174*   Lipid Profile: No results for input(s): CHOL, HDL, LDLCALC, TRIG, CHOLHDL, LDLDIRECT in the last 72 hours. Thyroid Function Tests: No results for input(s): TSH, T4TOTAL, FREET4, T3FREE, THYROIDAB in the last 72 hours. Anemia Panel: No results for input(s): VITAMINB12, FOLATE, FERRITIN, TIBC, IRON, RETICCTPCT in the last 72 hours. Sepsis Labs: No results for input(s): PROCALCITON, LATICACIDVEN in the last 168 hours.  No results found for this or any previous visit (from the past 240 hour(s)).       Radiology Studies: No results  found.      Scheduled Meds: . sodium chloride   Intravenous Once  . sodium chloride   Intravenous Once  . acetaminophen (TYLENOL) oral liquid 160 mg/5 mL  650 mg Oral Q4H  . acetaminophen  650 mg Rectal Q4H  . allopurinol  100 mg Oral BID  . bisacodyl  10 mg Rectal Once  . chlorhexidine gluconate (MEDLINE KIT)  15 mL Mouth Rinse BID  .  docusate  100 mg Oral BID  . famotidine  20 mg Oral BID  . feeding supplement  1 Container Oral TID BM  . ferrous sulfate  325 mg Oral Q breakfast  . furosemide  20 mg Intravenous Once  . furosemide  40 mg Oral q AM  . gabapentin  300 mg Oral Q12H  . hydrALAZINE  37.5 mg Oral Q8H  . insulin aspart  0-20 Units Subcutaneous Q4H  . insulin glargine  30 Units Subcutaneous Daily  . isosorbide mononitrate  30 mg Oral Daily  . mouth rinse  15 mL Mouth Rinse BID  . pantoprazole  40 mg Oral Daily  . QUEtiapine  50 mg Oral Q supper  . rosuvastatin  10 mg Oral QHS  . sodium chloride flush  3 mL Intravenous Q12H  . sodium chloride flush  5 mL Intracatheter Q8H  . sucralfate  1 g Oral TID WC & HS  . tamsulosin  0.4 mg Oral QPC supper  . Warfarin - Pharmacist Dosing Inpatient   Does not apply q1600   Continuous Infusions: . sodium chloride Stopped (03/17/20 2016)     LOS: 16 days    Time spent: 35 minutes spent on chart review, discussion with nursing staff, consultants, updating family and interview/physical exam; more than 50% of that time was spent in counseling and/or coordination of care.    Letonya Mangels J [place] DO [facility] Available via Epic secure chat 7am-7pm After these hours, please refer to coverage provider listed on [email or web address] 03/30/2020, 9:48 AM

## 2020-03-30 NOTE — Progress Notes (Signed)
Nutrition Follow-up  DOCUMENTATION CODES:   Not applicable  INTERVENTION:   -Ensure Enlive po TID, each supplement provides 350 kcal and 20 grams of protein  - d/c Boost Breeze  - Strawberry yogurt TID with meal trays  - Liberalize diet to Regular, verbal with readback order placed per Dr. [place]  - Encourage adequate PO intake and provide feeding assistance with meals, Dr. [place] to order  - If PO intake does not improve, recommend considering Cortrak placement and initiation of enteral nutrition if within pt's GOC  NUTRITION DIAGNOSIS:   Increased nutrient needs related to wound healing as evidenced by estimated needs.  Ongoing  GOAL:   Patient will meet greater than or equal to 90% of their needs  Unmet at this time  MONITOR:   PO intake, Supplement acceptance, Labs, Weight trends, Skin, I & O's  REASON FOR ASSESSMENT:   Ventilator    ASSESSMENT:   79 year old male who presented to the ED on 11/10 with generalized weakness. PMH of CAD, CHF, atrial fibrillation, DVT, HTN, HLD, PVD, CVA, thoracic back surgery last year, pressure injury. Pt admitted with sepsis due to acute cholecystitis.  11/02 - brief cardiac arrest in IR, intubated 11/03 - s/p percutaneous cholecystostomy tube placement 11/04 - extubated, diet advanced 11/15 - EGD showing no active bleeding, resolving stress gastritis  Pt currently on a Heart Healthy/Carb Modified diet. Discussed diet liberalization with Dr. [place] who agreed. Verbal with readback order placed for Regular diet.  Met with pt at bedside. Pt lethargic and a little confused today. Noted breakfast meal tray at bedside. Pt had consumed about 50% of a strawberry yogurt and a few bites of grits but nothing else from his meal tray (approximately 160 kcal and 8 grams of protein). Lunch meal tray arrived at time of RD visit. RD assisted in getting apple juice in cup. Pt consumed a few sips of apple juice with RD assistance. Recommend  feeding assistance with meals. Discussed with Dr. [place] who will order. Pt states that he does not want anything to eat at this time. NT at bedside assisting pt in getting set up for meal.  RD will order Ensure Enlive to aid pt in meeting kcal and protein needs.  If PO intake does not improve, recommend considering Cortrak placement and initiation of enteral nutrition if within [place]. Discussed with Dr. [place]  Admit weight: 94.2 kg Current weight: 98.4 kg  Pt with +1 pitting generalized edema and +1 pitting edema to BLE.  Meal Completion: 0-50% x last 8 meals  Medications reviewed and include: dulcolax, colace, pepcid, Boost Breeze TID, ferrous sulfate, lasix, SSI q 4 hours, lantus 30 units daily, protonix, sucralfate, warfarin  Labs reviewed: potassium 5.3, BUN 40, creatinine 1.32, hemoglobin 7.8 CBG's: 138-220 x 24 hours  Diet Order:   Diet Order            Diet regular Room service appropriate? Yes; Fluid consistency: Thin  Diet effective now                 EDUCATION NEEDS:   Not appropriate for education at this time  Skin:  Skin Assessment: Skin Integrity Issues: Stage II: bilateral buttocks, right ear  Last BM:  03/27/20  Height:   Ht Readings from Last 1 Encounters:  03/21/20 _0  (1.753 m)    Weight:   Wt Readings from Last 1 Encounters:  03/24/20 98.4 kg    Ideal Body Weight:  72.7 kg  BMI:  Body mass index  is 32.04 kg/m.  Estimated Nutritional Needs:   Kcal:  2100-2300  Protein:  110-130 grams  Fluid:  >/= 2.0 L    Gustavus Bryant, MS, RD, LDN Inpatient Clinical Dietitian Please see AMiON for contact information.

## 2020-03-30 NOTE — Plan of Care (Signed)
  Problem: Education: Goal: Knowledge of General Education information will improve Description: Including pain rating scale, medication(s)/side effects and non-pharmacologic comfort measures Outcome: Progressing   Problem: Health Behavior/Discharge Planning: Goal: Ability to manage health-related needs will improve Outcome: Progressing   Problem: Clinical Measurements: Goal: Ability to maintain clinical measurements within normal limits will improve Outcome: Progressing Goal: Will remain free from infection Outcome: Progressing Goal: Diagnostic test results will improve Outcome: Progressing Goal: Respiratory complications will improve Outcome: Progressing Goal: Cardiovascular complication will be avoided Outcome: Progressing   Problem: Activity: Goal: Risk for activity intolerance will decrease Outcome: Progressing   Problem: Nutrition: Goal: Adequate nutrition will be maintained Outcome: Progressing   Problem: Coping: Goal: Level of anxiety will decrease Outcome: Progressing   Problem: Elimination: Goal: Will not experience complications related to bowel motility Outcome: Progressing Goal: Will not experience complications related to urinary retention Outcome: Progressing   Problem: Pain Managment: Goal: General experience of comfort will improve Outcome: Progressing   Problem: Safety: Goal: Ability to remain free from injury will improve Outcome: Progressing   Problem: Skin Integrity: Goal: Risk for impaired skin integrity will decrease Outcome: Progressing   Problem: Education: Goal: Knowledge of disease or condition will improve Outcome: Progressing Goal: Understanding of medication regimen will improve Outcome: Progressing Goal: Individualized Educational Video(s) Outcome: Progressing   Problem: Activity: Goal: Ability to tolerate increased activity will improve Outcome: Progressing   Problem: Cardiac: Goal: Ability to achieve and maintain  adequate cardiopulmonary perfusion will improve Outcome: Progressing   Problem: Health Behavior/Discharge Planning: Goal: Ability to safely manage health-related needs after discharge will improve Outcome: Progressing   Problem: Education: Goal: Knowledge of disease and its progression will improve Outcome: Progressing   Problem: Health Behavior/Discharge Planning: Goal: Ability to manage health-related needs will improve Outcome: Progressing   Problem: Clinical Measurements: Goal: Complications related to the disease process or treatment will be avoided or minimized Outcome: Progressing Goal: Dialysis access will remain free of complications Outcome: Progressing   Problem: Activity: Goal: Activity intolerance will improve Outcome: Progressing   Problem: Fluid Volume: Goal: Fluid volume balance will be maintained or improved Outcome: Progressing   Problem: Nutritional: Goal: Ability to make appropriate dietary choices will improve Outcome: Progressing   Problem: Respiratory: Goal: Respiratory symptoms related to disease process will be avoided Outcome: Progressing   Problem: Self-Concept: Goal: Body image disturbance will be avoided or minimized Outcome: Progressing   Problem: Urinary Elimination: Goal: Progression of disease will be identified and treated Outcome: Progressing   Problem: Fluid Volume: Goal: Hemodynamic stability will improve Outcome: Progressing   Problem: Clinical Measurements: Goal: Diagnostic test results will improve Outcome: Progressing Goal: Signs and symptoms of infection will decrease Outcome: Progressing   Problem: Respiratory: Goal: Ability to maintain adequate ventilation will improve Outcome: Progressing   

## 2020-03-30 NOTE — Plan of Care (Signed)

## 2020-03-30 NOTE — Progress Notes (Signed)
Pt refuses CPAP 

## 2020-03-30 NOTE — Progress Notes (Addendum)
Inpatient Diabetes Program Recommendations  AACE/ADA: New Consensus Statement on Inpatient Glycemic Control (2015)  Target Ranges:  Prepandial:   less than 140 mg/dL      Peak postprandial:   less than 180 mg/dL (1-2 hours)      Critically ill patients:  140 - 180 mg/dL   Lab Results  Component Value Date   GLUCAP 174 (H) 03/30/2020   HGBA1C 12.1 (H) 03/14/2020    Review of Glycemic Control  Diabetes history: DM2 Outpatient Diabetes medications: Glipizide 5 mg bid, 70/30 50 units bid  Current orders for Inpatient glycemic control: Lantus 30 units qd + Novolog resistant correction q 4 hrs.  Inpatient Diabetes Program Recommendations:   Since patient is eating: -Change Novolog correction to moderate 0-15 units tid + hs 0-5 units -Add Novolog 3 units tid meal coverage if eats 50% May transition to 70/30 when preparing for discharge home. Secure chat sent to Dr. [place].  Thank you, Isaiah [name]. Daundre Biel, RN, MSN, CDE  Diabetes Coordinator Inpatient Glycemic Control Team Team Pager [phone] (8am-5pm) 03/30/2020 10:48 AM

## 2020-03-30 NOTE — Progress Notes (Signed)
ANTICOAGULATION CONSULT NOTE - Follow-Up  Pharmacy Consult for warfarin Indication: atrial fibrillation  No Known Allergies  Patient Measurements: Height: 5\' 9"  (175.3 cm) Weight: 98.4 kg (216 lb 14.9 oz) IBW/kg (Calculated) : 70.7 Heparin Dosing Weight: 90.1 kg  Vital Signs: Temp: 97.9 F (36.6 C) (11/17 1100) Temp Source: Oral (11/17 1100) BP: 124/55 (11/17 1100) Pulse Rate: 73 (11/17 1100)  Labs: Recent Labs    03/28/20 0145 03/28/20 0145 03/29/20 0116 03/30/20 0059  HGB 8.3*   < > 8.5* 7.8*  HCT 26.5*  --  27.5* 25.9*  PLT 258  --  261 237  LABPROT 15.6*  --  15.9* 15.3*  INR 1.3*  --  1.3* 1.3*  CREATININE 1.18  --  1.28* 1.32*   < > = values in this interval not displayed.    Estimated Creatinine Clearance: 52.5 mL/min (A) (by C-G formula based on SCr of 1.32 mg/dL (H)).   Medical History: Past Medical History:  Diagnosis Date  . Atrial fibrillation (HCC)    on Coumadin  . CAD (coronary artery disease)    s/p remote CABG, stent in 2019  . CHF (congestive heart failure) (HCC)   . Dementia (HCC)   . Diabetes mellitus   . DVT (deep venous thrombosis) (HCC)   . Fall at home 10/2015  . Hyperlipidemia   . Hypertension   . Left-sided carotid artery disease (HCC)   . MRSA infection   . Myocardial infarction (HCC)   . Obesity   . Peripheral vascular disease (HCC)   . Sleep apnea   . Stroke (HCC)   . Stroke (HCC)   . Venous insufficiency      Assessment: 78 you male with a history of PVD, CAD, HTN, HLD, DM and afib on warfarin presents with an NSTEMI. Pharmacy was consulted to dose heparin. PTA the patient was on warfarin at 5 mg daily with an INR goal of 2-3. It is unknown whether or not the patient was therapeutic while on this dose.  Endoscopy 11/15 showed no active bleeding, possible gastritis. GI added Carafate and patient cleared for anticoagulation. INR remains 1.3 after restart, CBC stable.  Goal of Therapy:  INR 2-3 Monitor platelets by  anticoagulation protocol: Yes   Plan:  Warfarin 5mg  PO x1 tonight Daily INR   12/15, PharmD, BCPS Clinical Pharmacist [phone] Please check AMION for all [facility] Pharmacy numbers 03/30/2020

## 2020-03-31 ENCOUNTER — Inpatient Hospital Stay (HOSPITAL_COMMUNITY): Payer: Medicare Other

## 2020-03-31 DIAGNOSIS — A419 Sepsis, unspecified organism: Secondary | ICD-10-CM | POA: Diagnosis not present

## 2020-03-31 LAB — GLUCOSE, CAPILLARY
Glucose-Capillary: 205 mg/dL — ABNORMAL HIGH (ref 70–99)
Glucose-Capillary: 163 mg/dL — ABNORMAL HIGH (ref 70–99)
Glucose-Capillary: 84 mg/dL (ref 70–99)
Glucose-Capillary: 97 mg/dL (ref 70–99)
Glucose-Capillary: 101 mg/dL — ABNORMAL HIGH (ref 70–99)
Glucose-Capillary: 93 mg/dL (ref 70–99)

## 2020-03-31 LAB — CBC
HCT: 30.1 % — ABNORMAL LOW (ref 39.0–52.0)
Hemoglobin: 9.2 g/dL — ABNORMAL LOW (ref 13.0–17.0)
MCH: 32.2 pg (ref 26.0–34.0)
MCHC: 30.6 g/dL (ref 30.0–36.0)
MCV: 105.2 fL — ABNORMAL HIGH (ref 80.0–100.0)
Platelets: 254 10*3/uL (ref 150–400)
RBC: 2.86 MIL/uL — ABNORMAL LOW (ref 4.22–5.81)
RDW: 19 % — ABNORMAL HIGH (ref 11.5–15.5)
WBC: 9.7 10*3/uL (ref 4.0–10.5)
nRBC: 0.4 % — ABNORMAL HIGH (ref 0.0–0.2)

## 2020-03-31 LAB — BLOOD GAS, VENOUS
Acid-Base Excess: 5.9 mmol/L — ABNORMAL HIGH (ref 0.0–2.0)
Bicarbonate: 32.8 mmol/L — ABNORMAL HIGH (ref 20.0–28.0)
Drawn by: 5915
FIO2: 40
O2 Saturation: 76.6 %
Patient temperature: 36.2
pCO2, Ven: 74.1 mmHg (ref 44.0–60.0)
pH, Ven: 7.263 (ref 7.250–7.430)
pO2, Ven: 41.8 mmHg (ref 32.0–45.0)

## 2020-03-31 LAB — BPAM RBC
Blood Product Expiration Date: 202112132359
ISSUE DATE / TIME: 202111171514
Unit Type and Rh: 6200

## 2020-03-31 LAB — BLOOD GAS, ARTERIAL
Acid-Base Excess: 4.8 mmol/L — ABNORMAL HIGH (ref 0.0–2.0)
Bicarbonate: 31.8 mmol/L — ABNORMAL HIGH (ref 20.0–28.0)
Drawn by: 137461
FIO2: 36
O2 Saturation: 95.5 %
Patient temperature: 36.8
pCO2 arterial: 75.7 mmHg (ref 32.0–48.0)
pH, Arterial: 7.245 — ABNORMAL LOW (ref 7.350–7.450)
pO2, Arterial: 82.5 mmHg — ABNORMAL LOW (ref 83.0–108.0)

## 2020-03-31 LAB — TYPE AND SCREEN
ABO/RH(D): A POS
Antibody Screen: NEGATIVE
Unit division: 0

## 2020-03-31 LAB — BASIC METABOLIC PANEL
Anion gap: 8 (ref 5–15)
BUN: 43 mg/dL — ABNORMAL HIGH (ref 8–23)
CO2: 31 mmol/L (ref 22–32)
Calcium: 8.9 mg/dL (ref 8.9–10.3)
Chloride: 100 mmol/L (ref 98–111)
Creatinine, Ser: 1.32 mg/dL — ABNORMAL HIGH (ref 0.61–1.24)
GFR, Estimated: 55 mL/min — ABNORMAL LOW (ref >60)
Glucose, Bld: 192 mg/dL — ABNORMAL HIGH (ref 70–99)
Potassium: 5.1 mmol/L (ref 3.5–5.1)
Sodium: 139 mmol/L (ref 135–145)

## 2020-03-31 LAB — PROTIME-INR
INR: 1.4 — ABNORMAL HIGH (ref 0.8–1.2)
Prothrombin Time: 16.5 seconds — ABNORMAL HIGH (ref 11.4–15.2)

## 2020-03-31 MED ORDER — WARFARIN SODIUM 5 MG PO TABS: 5.0000 mg | ORAL_TABLET | Freq: Once | ORAL | Status: DC

## 2020-03-31 MED ORDER — POLYETHYLENE GLYCOL 3350 17 G PO PACK
17.0000 g | PACK | Freq: Two times a day (BID) | ORAL | Status: DC
  Administered 2020-04-01 – 2020-04-05 (×7): 17 g via ORAL
  Filled 2020-03-31 (×7): qty 1

## 2020-03-31 NOTE — Progress Notes (Signed)
Physical Therapy Treatment Patient Details Name: Isaiah [name] MRN: [id number] DOB: 08-21-1940 Today's Date: 03/31/2020    History of Present Illness Isaiah [name] is a 79 y.o. male with medical history significant of PVD; CAD; HTN; HLD; DM; afib on Coumadin; and CHF presenting with weakness and a buttock pressure ulcer. Pt with sepsis and R UQ abdominal pain, found to have cholecystitis. 11/2 Pt was undergoing anesthesia for a perc chole drain and coded. 2023/04/01 perc chole performed. Pt intubated 11/2-11/4    PT Comments    Pt did not wear CPAP last night and as a result had an increase in blood CO2. Pt has been on CPAP all day and has been lethargic. Pt awake on entry and able to participate in UE/LE exercise before falling asleep. Pt has had slow progress working towards goals as a result goals have been downgraded. D/c plans remain appropriate. PT will continue to follow acutely.    Follow Up Recommendations  Supervision/Assistance - 24 hour;Home health PT     Equipment Recommendations  Hospital bed    Recommendations for Other Services       Precautions / Restrictions Precautions Precautions: Fall Precaution Comments: incontinent of bowel, perc chole drain, sacral wound Restrictions Weight Bearing Restrictions: No          Cognition Arousal/Alertness: Lethargic Behavior During Therapy: Flat affect Overall Cognitive Status: Impaired/Different from baseline Area of Impairment: Following commands;Awareness;Problem solving                       Following Commands: Follows one step commands consistently;Follows one step commands with increased time     Problem Solving: Slow processing;Decreased initiation;Difficulty sequencing;Requires verbal cues;Requires tactile cues General Comments: pt HOH, but requires maximal multimodal cuing for bed exercises today       Exercises General Exercises - Upper Extremity Shoulder Flexion: AAROM;Both;10 reps Shoulder  Extension: AAROM;Both;10 reps Elbow Flexion: AAROM;Both;10 reps Elbow Extension: AAROM;Both;10 reps Digit Composite Flexion: AAROM;Both;5 reps Composite Extension: AAROM;Both;5 reps General Exercises - Lower Extremity Ankle Circles/Pumps: AROM;Both;20 reps Heel Slides: AAROM;Both;10 reps Hip ABduction/ADduction: AAROM;Both;10 reps Straight Leg Raises: AAROM;Both;10 reps;Supine    General Comments General comments (skin integrity, edema, etc.): Pt placed on CPAP all day after increase in blood CO2 due to not wearing CPAP last night      Pertinent Vitals/Pain Pain Assessment: Faces Faces Pain Scale: Hurts little more Pain Location: generalized moaning with movement Pain Descriptors / Indicators: Grimacing;Moaning Pain Intervention(s): Limited activity within patient's tolerance;Monitored during session           PT Goals (current goals can now be found in the care plan section) Acute Rehab PT Goals Patient Stated Goal: unable to state PT Goal Formulation: With patient/family Time For Goal Achievement: 04/14/20 Potential to Achieve Goals: Fair Progress towards PT goals: Not progressing toward goals - comment (set back with CPAP today)    Frequency    Min 3X/week      PT Plan Current plan remains appropriate       AM-PAC PT "6 Clicks" Mobility   Outcome Measure  Help needed turning from your back to your side while in a flat bed without using bedrails?: Total Help needed moving from lying on your back to sitting on the side of a flat bed without using bedrails?: Total Help needed moving to and from a bed to a chair (including a wheelchair)?: Total Help needed standing up from a chair using your arms (e.g., wheelchair or bedside chair)?:  Total Help needed to walk in hospital room?: Total Help needed climbing 3-5 steps with a railing? : Total 6 Click Score: 6    End of Session Equipment Utilized During Treatment: Other (comment) (CPAP) Activity Tolerance: Patient  limited by lethargy;Patient limited by fatigue Patient left: in bed;with call bell/phone within reach;with bed alarm set;with family/visitor present Nurse Communication: Other (comment) (Bed level exercise) PT Visit Diagnosis: Unsteadiness on feet (R26.81);Other abnormalities of gait and mobility (R26.89);Muscle weakness (generalized) (M62.81);Difficulty in walking, not elsewhere classified (R26.2);Pain     Time: 3009-2330 PT Time Calculation (min) (ACUTE ONLY): 10 min  Charges:  $Therapeutic Exercise: 8-22 mins                     Haisley Arens B. Beverely Risen PT, DPT Acute Rehabilitation Services Pager [phone] Office [phone]    Elon Alas Fleet 03/31/2020, 2:56 PM

## 2020-03-31 NOTE — Progress Notes (Signed)
Patient placed on Bipap following ABG results.

## 2020-03-31 NOTE — Progress Notes (Signed)
ANTICOAGULATION CONSULT NOTE - Follow-Up  Pharmacy Consult for warfarin Indication: atrial fibrillation  No Known Allergies  Patient Measurements: Height: 5\' 9"  (175.3 cm) Weight: 98.4 kg (216 lb 14.9 oz) IBW/kg (Calculated) : 70.7 Heparin Dosing Weight: 90.1 kg  Vital Signs: Temp: 98.3 F (36.8 C) (11/18 0720) Temp Source: Oral (11/18 0720) BP: 132/61 (11/18 0720) Pulse Rate: 81 (11/18 0720)  Labs: Recent Labs    03/29/20 0116 03/29/20 0116 03/30/20 0059 03/30/20 0059 03/30/20 2036 03/31/20 0033  HGB 8.5*   < > 7.8*   < > 9.6* 9.2*  HCT 27.5*   < > 25.9*  --  30.9* 30.1*  PLT 261  --  237  --   --  254  LABPROT 15.9*  --  15.3*  --   --  16.5*  INR 1.3*  --  1.3*  --   --  1.4*  CREATININE 1.28*  --  1.32*  --   --  1.32*   < > = values in this interval not displayed.    Estimated Creatinine Clearance: 52.5 mL/min (A) (by C-G formula based on SCr of 1.32 mg/dL (H)).   Medical History: Past Medical History:  Diagnosis Date  . Atrial fibrillation (HCC)    on Coumadin  . CAD (coronary artery disease)    s/p remote CABG, stent in 2019  . CHF (congestive heart failure) (HCC)   . Dementia (HCC)   . Diabetes mellitus   . DVT (deep venous thrombosis) (HCC)   . Fall at home 10/2015  . Hyperlipidemia   . Hypertension   . Left-sided carotid artery disease (HCC)   . MRSA infection   . Myocardial infarction (HCC)   . Obesity   . Peripheral vascular disease (HCC)   . Sleep apnea   . Stroke (HCC)   . Stroke (HCC)   . Venous insufficiency      Assessment: 37 you male with a history of PVD, CAD, HTN, HLD, DM and afib on warfarin presents with an NSTEMI. Pharmacy was consulted to dose heparin. PTA the patient was on warfarin at 5 mg daily with an INR goal of 2-3. It is unknown whether or not the patient was therapeutic while on this dose.  Endoscopy 11/15 showed no active bleeding, possible gastritis. GI added Carafate and patient cleared for anticoagulation. INR  uptrending slowly to 1.4 after restart, CBC stable.  Goal of Therapy:  INR 2-3 Monitor platelets by anticoagulation protocol: Yes   Plan:  Warfarin 5mg  PO x1 tonight - continuing home dose Daily INR   12/15, PharmD, BCPS Clinical Pharmacist [phone] Please check AMION for all [facility] Pharmacy numbers 03/31/2020

## 2020-03-31 NOTE — Significant Event (Signed)
Rapid Response Event Note   Reason for Call :  Lethargy  Initial Focused Assessment:  Pt lying in bed. Eyes open to voice, eye opening is not sustained. Lung sounds are clear, bases are diminished. Breathing is unlabored. Gag reflex is weak. Skin is yellow, pink- warm, dry. PERRLA, 93mm. Biliary drain has minimal, rust colored drainage. Pt has been refusing CPAP overnight. Per RN, pt is intolerable of laying flat. When laid flat he will turn red in the face and have acute oxygen desaturation.   VS: T 98.85F, BP 132/61, HR 81, RR 16, SpO2 95% on 4LNC CBG 163  Interventions:  -ABG 7.245/ 75.7/ 82.5/ 31.8  -CXR  Plan of Care:  -BiPAP 40% 15/5  ,maintain oxygen saturation > 92% -Monitor pt for improvement in mentation -NPO while on BiPAP, if pt is awake/alert for PO intake allow at least 30 minutes before replacing pt on BiPAP following PO intake -[facility] at [facility] -Oral care per protocol  Call rapid response for additional needs.  Event Summary:  MD Notified: Dr. [place] Call Time: [phone] Arrival Time: 0720 End Time: 0815  Jennye Moccasin, RN

## 2020-03-31 NOTE — Progress Notes (Signed)
PROGRESS NOTE    SACHA RADLOFF  KGM:[id number] DOB: October 21, 1940 DOA: 03/14/2020 PCP: [name], [name], MD    Brief Narrative:  Isaiah [name] is a 79 year old male with past medical history notable for peripheral vascular disease, CAD, essential hypertension, hyperlipidemia, type 2 diabetes mellitus, atrial fibrillation on Coumadin, chronic systolic congestive heart failure who presented to the ED with weakness and a pressure ulcer on the buttock.  Patient also complaining of right upper quadrant pain.  In the ED, patient noted to be febrile with a leukocytosis.  Elevated creatinine.  CT abdomen/pelvis concerning for acute cholecystitis.  Patient was started on broad-spectrum antibiotics and IV fluids.  General surgery was consulted.  Hospital service consulted for admission for severe sepsis secondary to acute cholecystitis.   Assessment & Plan:   Principal Problem:   Sepsis due to undetermined organism [facility]) Active Problems:   Coronary artery disease   Chronic atrial fibrillation (HCC)   Essential hypertension   Hyperlipidemia   Type 2 diabetes mellitus with vascular disease (HCC)   Obstructive sleep apnea   CKD (chronic kidney disease), stage III ([place])   ARF (acute renal failure) (HCC)   Pressure injury of skin   Acute cholecystitis   Class 1 obesity due to excess calories with body mass index (BMI) of 33.0 to 33.9 in adult   [facility])   Cardiac arrest ([place])   Acute systolic heart failure (HCC)   Biventricular failure ([place])   Anemia due to GI blood loss   Acute hypercarbic respiratory failure Notified by nursing this morning, patient lethargic with decreased mentation. Although was able to take some of his early morning medicines prior. Rapid response was initiated. Patient has been refusing nocturnal CPAP over the past 4 days. Patient was hemodynamically stable, SPO2 95% on 3 L nasal cannula. ABG with pH 7.25, PaCO2 75.7, PaO2 82.5. Etiology likely secondary to hypercarbic  respiratory failure from CO2 retention related to noncompliance with nocturnal CPAP. --RT to place a BiPAP --Continue to closely monitor respiratory status --Repeat VBG at 1500 --Attempt to wean off BiPAP later this afternoon --Need to ensure he adheres to nocturnal CPAP  Severe sepsis with septic shock Patient presenting to the ED with fever, leukocytosis, renal failure and encephalopathy.  Shortly following admission, patient became hypotensive despite aggressive IV fluid resuscitation and was transferred to the ICU service for pressor support.  Patient was able to be weaned from vasopressors; and was transreferred back to the hospitalist service on 03/19/2020.  Acute cholecystitis Patient presenting to ED with right upper quadrant pain.  Imaging notable for distended gallbladder with layering gallstone and mild pericholestatic stranding.  Given severity of illness requiring ICU level of care and pressure support, patient underwent percutaneous cholecystostomy by IR on 03/16/2020. --WBC 21.2>>>12.7>10.6>10.3>10.2>10.0>9.7 --IV zosyn transitioned to Augmentin; completed 14 day course 11/15) --Continue cholecystostomy to gravity, flush 85m TID; 553mout past 24h  Acute on chronic combined systolic/diastolic congestive heart failure; decompensated with associated cardiogenic shock/PEA cardiac arrect Patient presenting with septic shock as above with associated cholecystitis with likely underlying cardiogenic shock requiring vasopressors and ventilatory support, with subsequent PEA arrest in the setting of percutaneous placement of gallbladder drain.  Extubated on 03/17/2020 and weaned off vasopressors.  TTE with severe biventricular dysfunction with LVEF 20% with severely dilated RA, moderately dilated LA. Repeat limited echocardiogram 03/26/2020 shows improvement of LVEF to 55-60%, with grade 2 diastolic dysfunction. --Cardiology signed off 11/16 --net negative 43060mast 24hrs and net negative 5.7L  since admission --  continue isosorbide mononitrate 30 mg p.o. daily --Hydralazine 37.5 mg q8h --restarted furosemide 45m PO daily --avoiding ACEi/ARB 2/2 AKI, and BB 2/2 bradycardia  Macrocytic Anemia/iron deficiency:  UGIB 2/2 stress gastritis --Iron 42, TIBC 253, Ferritin 118, foalte 16.6, B12 888, FOBT +  --Hgb 13.9>>>10.5>9.3>9.4>7.8>6.4>7.6>9.2>9.3>8.8>8.6>9.1>8.2>8.3>8.5>7.8>9.2 --s/p IV Feraheme 03/22/2020; continue home ferrous sulfate 3246mPO daily --Protonix 40 mg PO daily --Carafate 1 g p.o. TIDAC/HS; continue  until discharge per GI --famotidine 2043mO BID --transfused 2u pRBC 11/11; 1u pRBC 11/12, 1u pRBC 11/17 --[facility] consulted, EGD 11/15 with no active bleeding, erythematous mucosa gastric fundus consistent with resolving stress gastritis. --CBC daily; transfuse to maintain hemoglobin greater than 8 --restart coumadin ok per GI and cardiology; recommendations w/o loading  Hyperkalemia:  Potassium 5.1 today, s/p 10g Lokelma on 11/11, 5g Lokelma 11/12 and 11/15 and 10g Lokelma 10/16, Lokelna 10g PO 11/17 --Continue monitor on telemetry --restarted furosemide as above --Repeat electrolytes in a.m.  Acute renal failure on CKD stage IIIb: Presented with creatinine 3.46 with baseline roughly 1.6. etiology likely secondary to prerenal failure from dehydration, sepsis, and shock as above.  Now appears back to baseline. --Cr 3.45>>>1.25>1.21>1.44>1.34>1.18>1.28>1.32 --Avoid nephrotoxins, renally dose all medications --Closely monitor renal function daily.  Chronic permanent atrial fibrillation Currently rate controlled.  On Coumadin for anticoagulation at home.  Was also on IV heparin for NSTEMI during hospitalization.  Hospital course remarkable for bradycardia cardiac episodes and amiodarone was discontinued.  Not on beta-blockers. --INR 1.4 today --restarted coumadin 11/16 per GI and cardiology recommendations w/o loading --monitor INR daily  Acute metabolic  encephalopathy: Secondary to sepsis. He is hard of hearing  Cough:  Most likely secondary to congestive heart failure.  No signs of pneumonia on chest xray.  Cough has improved.  Type II NSTEMI/elevated troponin:  Supply demand ischemia from sepsis/septic shock.  Cardiology followed with no plan for any intervention; now signed off  Urinary retention: resolved. Continue intermittent straight cath if needed. Started on flomax. Continue to closely monitor urinary output  Coronary artery disease s/p CABG/PCI with stents:  No complaint of chest pain.  Continue Imdur.  Follow-up with cardiology as an outpatient. --restarted coumadin as above  Hypertension:  Currently blood pressure stable.  Continue hydralazine, Imdur, aspirin and statin  Hyperlipidemia: On Crestor  Diabetes type 2:  Poorly controlled, hemoglobin A1c 12.1.  Diabetic coordinator was following. --Lantus 30 units [place] daily --Resistant NovoLog insulin sliding scale for coverage --CBGs qAC/HS  OSA: On CPAP at night  Constipation: Last bowel movement 3 days ago, complains of constipation and mild abdominal discomfort. Refused bisacodyl supp yesterday. --Colace 100 mg p.o. twice daily --Miralax BID --continue to closely monitor BM  Obesity: BMI of 33.  Counseled on need for weight loss/lifestyle changes this complicates all facets of care.  Debility/deconditioning/memory deficits:  PT/OT consulted skilled nursing facility on discharge.  Patient lives with his daughter.  On Seroquel at home. Daughter DonButch Pennynts to take him home with home health.  Stage II pressure injury, buttocks; POA Pressure Injury 03/14/20 Buttocks Right Stage 2 -  Partial thickness loss of dermis presenting as a shallow open injury with a red, pink wound bed without slough. (Active)  03/14/20 2302  Location: Buttocks  Location Orientation: Right  Staging: Stage 2 -  Partial thickness loss of dermis presenting as a shallow open injury  with a red, pink wound bed without slough.  Wound Description (Comments):   Present on Admission: Yes     Pressure Injury 03/14/20 Buttocks Left  Stage 2 -  Partial thickness loss of dermis presenting as a shallow open injury with a red, pink wound bed without slough. (Active)  03/14/20 2300  Location: Buttocks  Location Orientation: Left  Staging: Stage 2 -  Partial thickness loss of dermis presenting as a shallow open injury with a red, pink wound bed without slough.  Wound Description (Comments):   Present on Admission: Yes     Pressure Injury 03/28/20 Ear Right Stage 2 -  Partial thickness loss of dermis presenting as a shallow open injury with a red, pink wound bed without slough. (Active)  03/28/20 1100  Location: Ear  Location Orientation: Right  Staging: Stage 2 -  Partial thickness loss of dermis presenting as a shallow open injury with a red, pink wound bed without slough.  Wound Description (Comments):   Present on Admission: No  Wound care consulted and were following.   Continue local wound care, offloading.   DVT prophylaxis: SCDs Code Status: Full code Family Communication: None present at bedside, updated patient's daughter, Butch Penny via telephone this morning.  Disposition Plan:  Status is: Inpatient  Remains inpatient appropriate because:Unsafe d/c plan, IV treatments appropriate due to intensity of illness or inability to take PO and Inpatient level of care appropriate due to severity of illness   Dispo: The patient is from: Home              Anticipated d/c is to: Home              Anticipated d/c date is: 1 day              Patient currently is not medically stable to d/c. Altered this morning due to hypercarbic respiratory failure from noncompliance with nocturnal CPAP, now placed on BiPAP.   Consultants:   PCCM  Interventional radiology  General surgery  Cardiology - signed off 11/16  [facility], Dr. Paulita Fujita, Dr. Cristina Gong  Procedures:    IR placement cholecystostomy on 03/17/2020  EGD 11/15  BiPAP 11/18  Antimicrobials:   Zosyn 11/2 - 11/5  Augmentin 11/5 - 11/15  Metronidazole 11/1 - 11/2  Cefepime 11/2 -11/2  Ceftriaxone 11/2 - 11/2   Subjective: Patient seen and examined at bedside, somnolent/confused. Has been replaced on CPAP this morning. ABG with hypercarbia, transitioning to BiPAP per RT. Tolerating diet, but continues with poor oral intake per RN.  Reports no bowel movement in the last few days; refused bisacodyl suppository yesterday. Hemoglobin stable this morning. No family present at bedside.  Denies headache, no dizziness, no chest pain, palpitations, no shortness of breath, no fever/chills/night sweats, no nausea/vomiting/diarrhea.  No acute events overnight per nursing staff.  Objective: Vitals:   03/31/20 0742 03/31/20 0821 03/31/20 0825 03/31/20 0900  BP: 132/60     Pulse: 78 75 68 67  Resp: 20 (!) _0 Temp:      TempSrc:      SpO2: 92% 96% 97% 98%  Weight:      Height:        Intake/Output Summary (Last 24 hours) at 03/31/2020 1059 Last data filed at 03/31/2020 0624 Gross per 24 hour  Intake 863.17 ml  Output 640 ml  Net 223.17 ml   Filed Weights   03/18/20 0400 03/21/20 1217 03/24/20 0500  Weight: 98.5 kg 102.2 kg 98.4 kg    Examination:  General exam: Appears calm and comfortable, chronically ill in appearance, somnolent Respiratory system: Clear to auscultation. Respiratory effort normal.  On CPAP Cardiovascular  system: S1 & S2 heard, RRR. No JVD, murmurs, rubs, gallops or clicks. No pedal edema. Gastrointestinal system: Abdomen is nondistended, soft, NTTP, cholecystostomy site noted with dressing in place, clean/dry/intact; but do note bloody drainage in cholecystostomy bag. No organomegaly or masses felt. Normal bowel sounds heard. Central nervous system: Somnolent but will arouse to command. No focal neurological deficits Extremities: Symmetric 5 x 5  power. Skin: No rashes, lesions or ulcers Psychiatry: Judgement and insight appear poor. Mood & affect appropriate.     Data Reviewed: I have personally reviewed following labs and imaging studies  CBC: Recent Labs  Lab 03/27/20 0035 03/27/20 1449 03/28/20 0145 03/29/20 0116 03/30/20 0059 03/30/20 2036 03/31/20 0033  WBC 10.3  --  10.2 10.2 10.0  --  9.7  HGB 8.2*   < > 8.3* 8.5* 7.8* 9.6* 9.2*  HCT 25.6*   < > 26.5* 27.5* 25.9* 30.9* 30.1*  MCV 103.6*  --  106.4* 107.4* 109.7*  --  105.2*  PLT 264  --  258 261 237  --  254   < > = values in this interval not displayed.   Basic Metabolic Panel: Recent Labs  Lab 03/27/20 0035 03/27/20 0035 03/28/20 0145 03/28/20 0145 03/29/20 0116 03/29/20 0811 03/29/20 1701 03/30/20 0059 03/31/20 0033  NA 136  --  134*  --  134*  --   --  138 139  K 4.9   < > 5.0   < > 6.2* 5.8* 5.4* 5.3* 5.1  CL 102  --  99  --  98  --   --  102 100  CO2 29  --  30  --  29  --   --  30 31  GLUCOSE 128*  --  98  --  160*  --   --  209* 192*  BUN 44*  --  35*  --  38*  --   --  40* 43*  CREATININE 1.18  --  1.18  --  1.28*  --   --  1.32* 1.32*  CALCIUM 8.7*  --  8.5*  --  8.7*  --   --  8.9 8.9   < > = values in this interval not displayed.   GFR: Estimated Creatinine Clearance: 52.5 mL/min (A) (by C-G formula based on SCr of 1.32 mg/dL (H)). Liver Function Tests: No results for input(s): AST, ALT, ALKPHOS, BILITOT, PROT, ALBUMIN in the last 168 hours. No results for input(s): LIPASE, AMYLASE in the last 168 hours. No results for input(s): AMMONIA in the last 168 hours. Coagulation Profile: Recent Labs  Lab 03/27/20 0035 03/28/20 0145 03/29/20 0116 03/30/20 0059 03/31/20 0033  INR 1.5* 1.3* 1.3* 1.3* 1.4*   Cardiac Enzymes: No results for input(s): CKTOTAL, CKMB, CKMBINDEX, TROPONINI in the last 168 hours. BNP (last 3 results) No results for input(s): PROBNP in the last 8760 hours. HbA1C: No results for input(s): HGBA1C in the last  72 hours. CBG: Recent Labs  Lab 03/30/20 1630 03/30/20 1916 03/30/20 2331 03/31/20 0326 03/31/20 0721  GLUCAP 164* 167* 163* 205* 163*   Lipid Profile: No results for input(s): CHOL, HDL, LDLCALC, TRIG, CHOLHDL, LDLDIRECT in the last 72 hours. Thyroid Function Tests: No results for input(s): TSH, T4TOTAL, FREET4, T3FREE, THYROIDAB in the last 72 hours. Anemia Panel: No results for input(s): VITAMINB12, FOLATE, FERRITIN, TIBC, IRON, RETICCTPCT in the last 72 hours. Sepsis Labs: No results for input(s): PROCALCITON, LATICACIDVEN in the last 168 hours.  No results found for this or  any previous visit (from the past 240 hour(s)).       Radiology Studies: DG CHEST PORT 1 VIEW  Result Date: 03/31/2020 CLINICAL DATA:  Short of breath.  Weakness. EXAM: PORTABLE CHEST 1 VIEW COMPARISON:  03/20/2020 FINDINGS: Slight interval worsening in lung aeration. Mild increase in the hazy airspace opacities in the mid and lower lungs. More consolidation noted at the left lung base partly silhouetting the left hemidiaphragm. Small bilateral pleural effusions have likely increased. No pneumothorax. IMPRESSION: 1. Mild interval worsening in lung aeration with findings suggesting congestive heart failure with a mild increase in pleural effusions and either atelectasis or airspace edema in the mid to lower lungs. Electronically Signed   By: Lajean Manes M.D.   On: 03/31/2020 08:26        Scheduled Meds: . sodium chloride   Intravenous Once  . acetaminophen (TYLENOL) oral liquid 160 mg/5 mL  650 mg Oral Q4H  . acetaminophen  650 mg Rectal Q4H  . allopurinol  100 mg Oral BID  . bisacodyl  10 mg Rectal Once  . chlorhexidine gluconate (MEDLINE KIT)  15 mL Mouth Rinse BID  . docusate  100 mg Oral BID  . famotidine  20 mg Oral BID  . feeding supplement  237 mL Oral TID BM  . ferrous sulfate  325 mg Oral Q breakfast  . furosemide  40 mg Oral q AM  . gabapentin  300 mg Oral Q12H  . hydrALAZINE  37.5  mg Oral Q8H  . insulin aspart  0-20 Units Subcutaneous Q4H  . insulin glargine  30 Units Subcutaneous Daily  . isosorbide mononitrate  30 mg Oral Daily  . mouth rinse  15 mL Mouth Rinse BID  . pantoprazole  40 mg Oral Daily  . QUEtiapine  50 mg Oral Q supper  . rosuvastatin  10 mg Oral QHS  . sodium chloride flush  3 mL Intravenous Q12H  . sodium chloride flush  5 mL Intracatheter Q8H  . sucralfate  1 g Oral TID WC & HS  . tamsulosin  0.4 mg Oral QPC supper  . warfarin  5 mg Oral ONCE-1600  . Warfarin - Pharmacist Dosing Inpatient   Does not apply q1600   Continuous Infusions: . sodium chloride Stopped (03/17/20 2016)     LOS: 17 days    Time spent: 35 minutes spent on chart review, discussion with nursing staff, consultants, updating family and interview/physical exam; more than 50% of that time was spent in counseling and/or coordination of care.    Raevon Broom J [place] DO [facility] Available via Epic secure chat 7am-7pm After these hours, please refer to coverage provider listed on [email or web address] 03/31/2020, 10:59 AM

## 2020-03-31 NOTE — TOC Progression Note (Signed)
Transition of Care [facility]) - Progression Note    Patient Details  Name: Isaiah [name] MRN: [id number] Date of Birth: 07-06-1940  [facility] CM/SW Contact  Leone Haven, RN Phone Number: 03/31/2020, 9:48 AM  Clinical Narrative:    Enrique Sack at [facility] states they will need orders for drain and wound care and the dc summary before they can go out to see patient.   Expected Discharge Plan: Skilled Nursing Facility Barriers to Discharge: Continued Medical Work up  Expected Discharge Plan and Services Expected Discharge Plan: Skilled Nursing Facility   Discharge Planning Services: CM Consult Post Acute Care Choice: Durable Medical Equipment, Home Health Living arrangements for the past 2 months: Single Family Home                 DME Arranged: Hospital bed, Other see comment DME Agency: [employer] Date DME Agency Contacted: 03/20/20 Time DME Agency Contacted: [phone] Representative spoke with at DME Agency: Shari Heritage HH Arranged: RN, PT, OT HH Agency: [facility] Date [facility] Agency Contacted: 03/20/20 Time HH Agency Contacted: [phone] Representative spoke with at [facility] Agency: Dondra Spry- request to call back on Monday 11/8   Social Determinants of Health (SDOH) Interventions    Readmission Risk Interventions No flowsheet data found.

## 2020-04-01 DIAGNOSIS — A419 Sepsis, unspecified organism: Secondary | ICD-10-CM | POA: Diagnosis not present

## 2020-04-01 LAB — GLUCOSE, CAPILLARY
Glucose-Capillary: 106 mg/dL — ABNORMAL HIGH (ref 70–99)
Glucose-Capillary: 117 mg/dL — ABNORMAL HIGH (ref 70–99)
Glucose-Capillary: 121 mg/dL — ABNORMAL HIGH (ref 70–99)
Glucose-Capillary: 143 mg/dL — ABNORMAL HIGH (ref 70–99)
Glucose-Capillary: 160 mg/dL — ABNORMAL HIGH (ref 70–99)
Glucose-Capillary: 81 mg/dL (ref 70–99)

## 2020-04-01 LAB — CBC
HCT: 28.7 % — ABNORMAL LOW (ref 39.0–52.0)
HCT: 28.9 % — ABNORMAL LOW (ref 39.0–52.0)
Hemoglobin: 8.7 g/dL — ABNORMAL LOW (ref 13.0–17.0)
Hemoglobin: 8.8 g/dL — ABNORMAL LOW (ref 13.0–17.0)
MCH: 32.1 pg (ref 26.0–34.0)
MCH: 32.6 pg (ref 26.0–34.0)
MCHC: 30.3 g/dL (ref 30.0–36.0)
MCHC: 30.4 g/dL (ref 30.0–36.0)
MCV: 105.9 fL — ABNORMAL HIGH (ref 80.0–100.0)
MCV: 107 fL — ABNORMAL HIGH (ref 80.0–100.0)
Platelets: 270 10*3/uL (ref 150–400)
Platelets: 272 10*3/uL (ref 150–400)
RBC: 2.7 MIL/uL — ABNORMAL LOW (ref 4.22–5.81)
RBC: 2.71 MIL/uL — ABNORMAL LOW (ref 4.22–5.81)
RDW: 18 % — ABNORMAL HIGH (ref 11.5–15.5)
RDW: 18.7 % — ABNORMAL HIGH (ref 11.5–15.5)
WBC: 7.8 10*3/uL (ref 4.0–10.5)
WBC: 9 10*3/uL (ref 4.0–10.5)
nRBC: 0 % (ref 0.0–0.2)
nRBC: 0.2 % (ref 0.0–0.2)

## 2020-04-01 LAB — BASIC METABOLIC PANEL
Anion gap: 9 (ref 5–15)
BUN: 45 mg/dL — ABNORMAL HIGH (ref 8–23)
CO2: 30 mmol/L (ref 22–32)
Calcium: 8.6 mg/dL — ABNORMAL LOW (ref 8.9–10.3)
Chloride: 102 mmol/L (ref 98–111)
Creatinine, Ser: 1.28 mg/dL — ABNORMAL HIGH (ref 0.61–1.24)
GFR, Estimated: 57 mL/min — ABNORMAL LOW (ref >60)
Glucose, Bld: 91 mg/dL (ref 70–99)
Potassium: 4.9 mmol/L (ref 3.5–5.1)
Sodium: 141 mmol/L (ref 135–145)

## 2020-04-01 LAB — BLOOD GAS, ARTERIAL
Acid-Base Excess: 7 mmol/L — ABNORMAL HIGH (ref 0.0–2.0)
Bicarbonate: 32 mmol/L — ABNORMAL HIGH (ref 20.0–28.0)
Drawn by: 51147
FIO2: 40
O2 Saturation: 96.9 %
Patient temperature: 36.8
pCO2 arterial: 54.4 mmHg — ABNORMAL HIGH (ref 32.0–48.0)
pH, Arterial: 7.386 (ref 7.350–7.450)
pO2, Arterial: 83.5 mmHg (ref 83.0–108.0)

## 2020-04-01 LAB — PROTIME-INR
INR: 1.6 — ABNORMAL HIGH (ref 0.8–1.2)
Prothrombin Time: 18.8 seconds — ABNORMAL HIGH (ref 11.4–15.2)

## 2020-04-01 MED ORDER — WARFARIN SODIUM 5 MG PO TABS
5.0000 mg | ORAL_TABLET | Freq: Once | ORAL | Status: AC
  Administered 2020-04-01: 5 mg via ORAL
  Filled 2020-04-01: qty 1

## 2020-04-01 MED ORDER — PANTOPRAZOLE SODIUM 40 MG IV SOLR
40.0000 mg | Freq: Two times a day (BID) | INTRAVENOUS | Status: DC
  Administered 2020-04-01 – 2020-04-05 (×8): 40 mg via INTRAVENOUS
  Filled 2020-04-01 (×8): qty 40

## 2020-04-01 NOTE — Progress Notes (Signed)
Patient weaned off BiPAP after ABG results.  Patient transitioned to 4L [place] vitals stable.  RT will continue to monitor.

## 2020-04-01 NOTE — Progress Notes (Signed)
Patient's HR dropped to 37 for couple of seconds and went back up to 60s. HR sustaining between 60s and 70s.  MD made aware.

## 2020-04-01 NOTE — Progress Notes (Signed)
PROGRESS NOTE    [name]  GMW:[id number] DOB: 19-May-1940 DOA: 03/14/2020  PCP: [name], [name], MD    Brief Narrative:  Chief Complaint: Weakness, pressure ulcer  HPI: Isaiah [name] is a 79 y.o. male with medical history significant of PVD; CAD; HTN; HLD; DM; afib on Coumadin; and CHF presenting with weakness and a buttock pressure ulcer.  Everything was normal Friday, it was a great day.  Saturday AM, he was complaining of gas and bloating.  He sat in his lift chair during the day.  That evening, he was somewhat confused.  He didn't eat that day.  Sunday, he slept all day.  She tried to get him up and he was too weak to stand, mumbling, confused.  He is having RUQ abdominal pain.  No fever at home, although he was flushed.  He has had a pressure ulcer since back surgery a year ago, has a scab over it and it pulled off.      ED Course:  Concern for sepsis, AKI, likely cholecystitis.  Abdominal pain Friday, delirium and weakness over the weekend.  Febrile to 100.6, hypoxic on arrival.  Significant RUQ TTP.  Labs with AKI, leukocytosis.  CT with concern for gallbladder pathology.  Surgery to evaluate.  ?volume overload so not given 30 cc/kg bolus.  New cirrhosis on CT, negative NH4.  Myoclonic jerking, likely encephalopathy plus Neurontin with AKI.  Overview: Pt admitted on 03/14/2020 for Abdominal pain, sepsis and ct in ed showed layering gallstones c/w acute cholecystitis.Pt was seen by general surgery same day and started on maxipime and flagyl, and due to poor medical condition pt was offered percutaneous cholecystostomy tube, after INR is subtherapeutic, for which pt was given vit k. Plan was for cardiac clearance then proceeding to Cholecystomy in 8 weeks.   03/15/2020 :Cardiac arrest. While down in IR on 03/15/2020 pt had a brief cardiac arrest . Patient in IR for procedure, elected to perform under general anesthesia due to severe OSA, renal failure.  After intubation and  before Perc tube placement, patient became hypotensive and bradycardic, lost pulses.  CPR initiated, completed 1 round and 1 round of epi and pulses resumed. Levophed started, IV fluids. Pt was then trasnferred to ICU.  On 03/17/2011 pt had  successful placement of per chole by interventional radiology . Pt had echo done on 03/16/2020: Echocardiogram (03/16/2020) 1. Severe biventricular dysfunction with at least mild-moderate secondary  MR and TR.  2. Left ventricular ejection fraction, by estimation, is 20%. The left  ventricle has severely decreased function. The left ventricle demonstrates  global hypokinesis. There is mild left ventricular hypertrophy. Left  ventricular diastolic parameters are  indeterminate. There is the interventricular septum is flattened in  systole and diastole, consistent with right ventricular pressure and  volume overload.  3. Right ventricular systolic function is severely reduced. The right  ventricular size is severely enlarged.  4. Left atrial size was mild to moderately dilated.  5. Right atrial size was severely dilated.  6. The mitral valve is normal in structure, and demonstrates tenting due  to LV dysfunction. Mild to moderate mitral valve regurgitation. No  evidence of mitral stenosis.  7. Tricuspid valve regurgitation is mild to moderate.  8. The aortic valve is grossly normal. There is mild calcification of the  aortic valve. Aortic valve regurgitation is trivial. No aortic stenosis is  present.   Left Heart Cath: (2018)  Ost Cx to Prox Cx lesion, 100 %stenosed. SVG to  OM patent, with Y graft SVG to diagonal originating from mid portion of the OM graft. Moderate disease in the origin of the Y graft portion to the diagonal.  Mid LAD lesion, 70 %stenosed. Dist LAD lesion, 100 %stenosed. LIMA to LAD is widely patent.  Ost RCA to Prox RCA lesion, 80 %stenosed. A STENT XIENCE ALPINE RX [id number] drug eluting stent was successfully placed.  Post  intervention, there is a 0% residual stenosis.  LV end diastolic pressure is moderately elevated.  There is no aortic valve stenosis. Pt was extubated on 03/17/2020 and was following commands. Pt's heart failure was stable and off pressors and GDMT not initiated due to AKI and Bradycardia. Pt's elevated troponin was attributed demand ischemia.ANd for his atrial fibrillation with bradycardia Anticoagulation was d.c due to anemia. AKI was improving. Pt was transferred to [facility] team on the 6th. His heart failure was being managed with iv diuretics and decreased ef of 20% was attributed to post cardiac arrest. Pt remained in chronic a.fib and AC was held due to anemia and bleeding on cholecystostomy tube.Pt was also guaiac positive and therefore coumadin held. Pt was continued on iv abx for his septic shock from a/c cholecystitis.  Plan was for discharge on 03/22/2020 with IR drain and oxygen. Pt continued to get iv abx and PT OT was consulted and DME was delivered home for him. Gi was consulted for hie anemia  and guaiac positive  stool on 03/24/2020.  Pt was seen by cardiology dr.C and improved and became euvolemic and repeat echo as done and ef was improved to 55-60 % with grade ii diastolic dysfunction.  Pt s/p egd  11/15/2021which  Patchy mildly erythematous mucosa without bleeding was found in the  gastric fundus, resolving stress gastritis . Pt's creatinine also improved to 1.18 from 3.45. Per TOC note pt will need dme order.     Assessment & Plan: Principal Problem:   Sepsis due to undetermined organism [facility]) Active Problems:   Coronary artery disease   Chronic atrial fibrillation (HCC)   Essential hypertension   Hyperlipidemia   Type 2 diabetes mellitus with vascular disease (HCC)   Obstructive sleep apnea   CKD (chronic kidney disease), stage III ([place])   ARF (acute renal failure) (HCC)   Pressure injury of skin   Acute cholecystitis   Class 1 obesity due to excess calories with body  mass index (BMI) of 33.0 to 33.9 in adult   Shock [facility])   Cardiac arrest ([place])   Acute systolic heart failure (HCC)   Biventricular failure ([place])   Anemia due to GI blood loss  Acute hypercarbic respiratory failure Notified by nursing this morning, patient lethargic with decreased mentation. Although was able to take some of his early morning medicines prior. Rapid response was initiated. Patient has been refusing nocturnal CPAP over the past 4 days. Patient was hemodynamically stable, SPO2 95% on 3 L nasal cannula. ABG with pH 7.25, PaCO2 75.7, PaO2 82.5. Etiology likely secondary to hypercarbic respiratory failure from CO2 retention related to noncompliance with nocturnal CPAP. --RT to place a BiPAP --Continue to closely monitor respiratory status --Repeat VBG at 1500 --Attempt to wean off BiPAP later this afternoon --Need to ensure he adheres to nocturnal CPAP --pt encouraged to use cpap but is intolerant to mask.   Severe sepsis with septic shock-Resolved.  Patient presenting to the ED with fever, leukocytosis, renal failure and encephalopathy.  Shortly following admission, patient became hypotensive despite aggressive IV fluid resuscitation and was  transferred to the ICU service for pressor support.  Patient was able to be weaned from vasopressors; and was transreferred back to the hospitalist service on 03/19/2020.   Acute cholecystitis Patient presenting to ED with right upper quadrant pain.  Imaging notable for distended gallbladder with layering gallstone and mild pericholestatic stranding.  Given severity of illness requiring ICU level of care and pressure support, patient underwent percutaneous cholecystostomy by IR on 03/16/2020. --WBC 21.2>>>12.7>10.6>10.3>10.2>10.0>9.7 --IV zosyn transitioned to Augmentin; completed 14 day course 11/15) --Continue cholecystostomy to gravity, flush 13m TID; 526mout past 24h --D/C home with PER Chole tube and Outpatient gen surg followup.    Acute on chronic combined systolic/diastolic congestive heart failure; decompensated with associated cardiogenic shock/PEA cardiac arrect Patient presenting with septic shock as above with associated cholecystitis with likely underlying cardiogenic shock requiring vasopressors and ventilatory support, with subsequent PEA arrest in the setting of percutaneous placement of gallbladder drain.  Extubated on 03/17/2020 and weaned off vasopressors.  TTE with severe biventricular dysfunction with LVEF 20% with severely dilated RA, moderately dilated LA. Repeat limited echocardiogram 03/26/2020 shows improvement of LVEF to 55-60%, with grade 2 diastolic dysfunction. --Cardiology signed off 11/16 --net negative [id number] 24hrs and net negative 5.7L since admission --continue isosorbide mononitrate 30 mg p.o. daily --Hydralazine 37.5 mg q8h --restarted furosemide 13m4m daily --avoiding ACEi/ARB 2/2 AKI, and BB 2/2 bradycardia --Cardiology has singed off and we will start pt on low dose acei, BP below.  Blood pressure (!) 137/59, pulse 70, temperature 97.6 F (36.4 C), temperature source Axillary, resp. rate (!) 22, height _0  (1.753 m), weight 98.4 kg, SpO2 99 %. -- low dose lisinopril at 2.5 mg po daily starting form today in evening.  Macrocytic Anemia/iron deficiency:  UGIB 2/2 stress gastritis --Iron 42, TIBC 253, Ferritin 118, foalte 16.6, B12 888, FOBT +  --Hgb 13.9>>>10.5>9.3>9.4>7.8>6.4>7.6>9.2>9.3>8.8>8.6>9.1>8.2>8.3>8.5>7.8>9.2 --s/p IV Feraheme 03/22/2020; continue home ferrous sulfate 325mg12mdaily --Protonix 40 mg PO daily --Carafate 1 g p.o. TIDAC/HS; continue  until discharge per GI --famotidine 20mg 64mID --transfused 2u pRBC 11/11; 1u pRBC 11/12, 1u pRBC 11/17 --[facility] consulted, EGD 11/15 with no active bleeding, erythematous mucosa gastric fundus consistent with resolving stress gastritis. --CBC daily; transfuse to maintain hemoglobin greater than 8 --restart coumadin ok  per GI and cardiology; recommendations w/o loading -- pt has been restarted on coumadin and he had a large melanotic BM today and if he continued to have melanotic or bloody stools he is to have LGI eva.    Hyperkalemia: RESOLVED. Potassium 5.1 today, s/p 10g Lokelma on 11/11, 5g Lokelma 11/12 and 11/15 and 10g Lokelma 10/16, Lokelna 10g PO 11/17 --Continue monitor on telemetry --restarted furosemide as above --Repeat electrolytes in a.m.  Acute renal failure on CKD stage IIIb: Presented with creatinine 3.46 with baseline roughly 1.6. etiology likely secondary to prerenal failure from dehydration, sepsis, and shock as above.  Now appears back to baseline. --Cr 3.45>>>1.25>1.21>1.44>1.34>1.18>1.28>1.32 --Avoid nephrotoxins, renally dose all medications --Closely monitor renal function daily. -- Lab Results  Component Value Date   CREATININE 1.28 (H) 04/01/2020   CREATININE 1.32 (H) 03/31/2020   CREATININE 1.32 (H) 03/30/2020   Creatinine continues to improve.  Chronic permanent atrial fibrillation Currently rate controlled.  On Coumadin for anticoagulation at home.  Was also on IV heparin for NSTEMI during hospitalization.  Hospital course remarkable for bradycardia cardiac episodes and amiodarone was discontinued.  Not on beta-blockers. --INR 1.4 today --restarted coumadin 11/16 per GI and cardiology recommendations w/o  loading --monitor INR daily. --inr today is 1.6  Acute metabolic encephalopathy:Secondary to sepsis. He is hard of hearing Resolved.   Cough:  Most likely secondary to congestive heart failure. No signs of pneumonia on chest xray.Cough hasimproved. Resolved but pt still on oxygen we will obtain ct chest without contrast.   Type II NSTEMI/elevated troponin:  Supply demand ischemia from sepsis/septic shock. Cardiology followed with no plan for any intervention; now signed off. No change in meds currently.   Urinary retention:resolved. Continue  intermittent straight cath if needed. Started on flomax. Continue to closely monitor urinary output.  Intake/Output Summary (Last 24 hours) at 04/01/2020 1742 Last data filed at 04/01/2020 1700 Gross per 24 hour  Intake 740 ml  Output 1550 ml  Net -810 ml     Coronary artery disease s/p CABG/PCI with stents:  No complaint of chest pain. Continue Imdur. Follow-up with cardiology as an outpatient. --restarted coumadin as above. --If pt continued to have melanotic stools and or hb is dropping then coumadin to be stopped.  Hypertension: Currently blood pressure stable. Continue hydralazine, Imdur, aspirin and statin Blood pressure (!) 137/59, pulse 70, temperature 97.6 F (36.4 C), temperature source Axillary, resp. rate (!) 22, height _0  (1.753 m), weight 98.4 kg, SpO2 99 %. BP is stable no change in regimen.   Hyperlipidemia:  On Crestor Continue crestor.   Diabetes type 2:  Poorly controlled, hemoglobin A1c 12.1.  Diabetic coordinator was following. --Lantus 30 units [place] daily --Resistant NovoLog insulin sliding scale for coverage --CBGs qAC/HS --No change in regimen.  OSA:On CPAP at night. Encouraged him to cpap  tonight but the mask we have here is what he is intolerant to.    Constipation: Last bowel movement 3 days ago, complains of constipation and mild abdominal discomfort. Refused bisacodyl supp yesterday. --Colace 100 mg p.o. twice daily --Miralax BID --continue to closely monitor BM -- resolved.   Obesity: BMI of 33.  Counseled on need for weight loss/lifestyle changes this complicates all facets of care. counseling when pt's acute issues are stable.  Debility/deconditioning/memory deficits:  PT/OT consulted skilled nursing facility on discharge. Patient lives with his daughter.On Seroquel at home. Daughter Butch Penny wants to take him home with home health. Anticipate d/c in am  Stage II pressure injury, buttocks; POA- No change.  Pressure  Injury 03/14/20 Buttocks Right Stage 2 -  Partial thickness loss of dermis presenting as a shallow open injury with a red, pink wound bed without slough. (Active)  03/14/20 2302  Location: Buttocks  Location Orientation: Right  Staging: Stage 2 -  Partial thickness loss of dermis presenting as a shallow open injury with a red, pink wound bed without slough.  Wound Description (Comments):   Present on Admission: Yes     Pressure Injury 03/14/20 Buttocks Left Stage 2 -  Partial thickness loss of dermis presenting as a shallow open injury with a red, pink wound bed without slough. (Active)  03/14/20 2300  Location: Buttocks  Location Orientation: Left  Staging: Stage 2 -  Partial thickness loss of dermis presenting as a shallow open injury with a red, pink wound bed without slough.  Wound Description (Comments):   Present on Admission: Yes     Pressure Injury 03/28/20 Ear Right Stage 2 -  Partial thickness loss of dermis presenting as a shallow open injury with a red, pink wound bed without slough. (Active)  03/28/20 1100  Location: Ear  Location Orientation: Right  Staging: Stage 2 -  Partial  thickness loss of dermis presenting as a shallow open injury with a red, pink wound bed without slough.  Wound Description (Comments):   Present on Admission: No  Wound care consulted and were following.  Continue local wound care, offloading.       DVT prophylaxis:  SCD'd  Code Status:  Full.  Family Communication:  Education officer, museum at [employer].  Disposition Plan:  Status is: Inpatient Remains inpatient appropriate because:Unsafe d/c plan, IV treatments appropriate due to intensity of illness or inability to take PO and Inpatient level of care appropriate due to severity of illness  Dispo: The patient is from: Home  Anticipated d/c is to: Home  Anticipated d/c date is: 1 day  Patient currently is not medically stable to d/c. Altered this morning  due to hypercarbic respiratory failure from noncompliance with nocturnal CPAP, now placed on BiPAP.   Consultants:   PCCM  Interventional radiology  General surgery  Cardiology - signed off 11/16  [facility] gastroenterology, Dr. Paulita Fujita, Dr. Cristina Gong  Procedures:   IR placement cholecystostomy on 03/17/2020  EGD 11/15  BiPAP 11/18  Subjective: Pt today is on oxygen and is alert but confused. Plan is for d/c with home health.  He is still on oxygen and not on oxygen at baseline. D/w daughter that I will review chart and consider ct chest whithout out contrast. D?w her that we will probably d/c tomorrow.  Per gi and cardiology note pt can restart coumadin.Per abg pt is still hypercarbic.  Cont with cpap overnight and d/c in am.h/h is stable at 8.8. We will obtain ct chest and follow and d/c in am  if pt is stable .   Lab Results  Component Value Date   CREATININE 1.28 (H) 04/01/2020   CREATININE 1.32 (H) 03/31/2020   CREATININE 1.32 (H) 03/30/2020  Creatinine is improving , and will avoid renally dose meds and contrast studies.     Objective: Vitals:   04/01/20 1106 04/01/20 1320 04/01/20 1550 04/01/20 1555  BP: 136/65   (!) 137/59  Pulse: 80 78 71 70  Resp: 20 (!) 24 (!) 26 (!) 22  Temp: 97.6 F (36.4 C)   97.6 F (36.4 C)  TempSrc: Oral   Axillary  SpO2: 93% 98% 99% 99%  Weight:      Height:       SpO2: 99 % O2 Flow Rate (L/min): 4 L/min FiO2 (%): 40 %  Intake/Output Summary (Last 24 hours) at 04/01/2020 1734 Last data filed at 04/01/2020 1700 Gross per 24 hour  Intake 740 ml  Output 1550 ml  Net -810 ml   Filed Weights   03/18/20 0400 03/21/20 1217 03/24/20 0500  Weight: 98.5 kg 102.2 kg 98.4 kg    Examination: Blood pressure (!) 137/59, pulse 70, temperature 97.6 F (36.4 C), temperature source Axillary, resp. rate (!) 22, height _0  (1.753 m), weight 98.4 kg, SpO2 99 %. Physical Exam Constitutional:      General: He is awake.      Appearance: Normal appearance. He is obese.     Interventions: Nasal cannula in place.  HENT:     Head: Normocephalic.     Right Ear: Hearing and external ear normal.     Left Ear: Hearing and external ear normal.     Nose: Nose normal.     Mouth/Throat:     Lips: Pink.     Mouth: Mucous membranes are dry.     Tongue: No lesions. Tongue does not deviate from  midline.     Palate: No mass and lesions.     Pharynx: Oropharynx is clear.  Eyes:     General: Lids are normal.     Extraocular Movements: Extraocular movements intact.     Conjunctiva/sclera: Conjunctivae normal.  Neck:     Vascular: No carotid bruit.  Cardiovascular:     Rate and Rhythm: Normal rate. Rhythm irregular.     Pulses: Normal pulses.  Pulmonary:     Effort: Pulmonary effort is normal.     Breath sounds: Decreased air movement present. Examination of the right-upper field reveals decreased breath sounds. Examination of the left-upper field reveals decreased breath sounds. Examination of the right-middle field reveals decreased breath sounds. Examination of the left-middle field reveals decreased breath sounds. Examination of the right-lower field reveals decreased breath sounds and rhonchi. Examination of the left-lower field reveals decreased breath sounds and rhonchi. Decreased breath sounds and rhonchi present.  Abdominal:     General: Bowel sounds are normal. There is no distension or abdominal bruit. There are no signs of injury.     Palpations: Abdomen is soft.  Musculoskeletal:     Cervical back: Full passive range of motion without pain.     Right lower leg: No edema.  Neurological:     Mental Status: He is alert and oriented to person, place, and time.     Cranial Nerves: Cranial nerves are intact.  Psychiatric:        Behavior: Behavior is cooperative.     Data Reviewed:  I/O last 3 completed shifts: In: 34 [I.V.:23; Other:10] Out: 1280 [Urine:1250; Drains:30] Total I/O In: 720 [P.O.:720] Out:  500 [Urine:500] Lab Results  Component Value Date   CREATININE 1.28 (H) 04/01/2020   CREATININE 1.32 (H) 03/31/2020   CREATININE 1.32 (H) 03/30/2020   CBC: Recent Labs  Lab 03/28/20 0145 03/28/20 0145 03/29/20 0116 03/30/20 0059 03/30/20 2036 03/31/20 0033 04/01/20 0015  WBC 10.2  --  10.2 10.0  --  9.7 9.0  HGB 8.3*   < > 8.5* 7.8* 9.6* 9.2* 8.8*  HCT 26.5*   < > 27.5* 25.9* 30.9* 30.1* 28.9*  MCV 106.4*  --  107.4* 109.7*  --  105.2* 107.0*  PLT 258  --  261 237  --  254 270   < > = values in this interval not displayed.   Basic Metabolic Panel: Recent Labs  Lab 03/28/20 0145 03/28/20 0145 03/29/20 0116 03/29/20 0116 03/29/20 0811 03/29/20 1701 03/30/20 0059 03/31/20 0033 04/01/20 0015  NA 134*  --  134*  --   --   --  138 139 141  K 5.0   < > 6.2*   < > 5.8* 5.4* 5.3* 5.1 4.9  CL 99  --  98  --   --   --  102 100 102  CO2 30  --  29  --   --   --  _0 GLUCOSE 98  --  160*  --   --   --  209* 192* 91  BUN 35*  --  38*  --   --   --  40* 43* 45*  CREATININE 1.18  --  1.28*  --   --   --  1.32* 1.32* 1.28*  CALCIUM 8.5*  --  8.7*  --   --   --  8.9 8.9 8.6*   < > = values in this interval not displayed.   GFR: Estimated Creatinine Clearance: 54.1 mL/min (  A) (by C-G formula based on SCr of 1.28 mg/dL (H)). Liver Function Tests: No results for input(s): AST, ALT, ALKPHOS, BILITOT, PROT, ALBUMIN in the last 168 hours. No results for input(s): LIPASE, AMYLASE in the last 168 hours. No results for input(s): AMMONIA in the last 168 hours.  Coagulation Profile: Recent Labs  Lab 03/28/20 0145 03/29/20 0116 03/30/20 0059 03/31/20 0033 04/01/20 0015  INR 1.3* 1.3* 1.3* 1.4* 1.6*   Cardiac Enzymes: No results for input(s): CKTOTAL, CKMB, CKMBINDEX, TROPONINI in the last 168 hours. BNP (last 3 results) No results for input(s): PROBNP in the last 8760 hours. HbA1C: No results for input(s): HGBA1C in the last 72 hours. CBG: Recent Labs  Lab  03/31/20 2316 04/01/20 0249 04/01/20 0802 04/01/20 1105 04/01/20 1531  GLUCAP 93 81 106* 160* 121*   Lipid Profile: No results for input(s): CHOL, HDL, LDLCALC, TRIG, CHOLHDL, LDLDIRECT in the last 72 hours. Thyroid Function Tests: No results for input(s): TSH, T4TOTAL, FREET4, T3FREE, THYROIDAB in the last 72 hours. Anemia Panel: No results for input(s): VITAMINB12, FOLATE, FERRITIN, TIBC, IRON, RETICCTPCT in the last 72 hours. Sepsis Labs: No results for input(s): PROCALCITON, LATICACIDVEN in the last 168 hours.  No results found for this or any previous visit (from the past 240 hour(s)).    Radiology Studies: DG CHEST PORT 1 VIEW  Result Date: 03/31/2020 CLINICAL DATA:  Short of breath.  Weakness. EXAM: PORTABLE CHEST 1 VIEW COMPARISON:  03/20/2020 FINDINGS: Slight interval worsening in lung aeration. Mild increase in the hazy airspace opacities in the mid and lower lungs. More consolidation noted at the left lung base partly silhouetting the left hemidiaphragm. Small bilateral pleural effusions have likely increased. No pneumothorax. IMPRESSION: 1. Mild interval worsening in lung aeration with findings suggesting congestive heart failure with a mild increase in pleural effusions and either atelectasis or airspace edema in the mid to lower lungs. Electronically Signed   By: Lajean Manes M.D.   On: 03/31/2020 08:26      Current Facility-Administered Medications (Endocrine & Metabolic):  .  insulin aspart (novoLOG) injection 0-20 Units .  insulin glargine (LANTUS) injection 30 Units   Current Facility-Administered Medications (Cardiovascular):  .  furosemide (LASIX) tablet 40 mg .  hydrALAZINE (APRESOLINE) injection 5 mg .  hydrALAZINE (APRESOLINE) tablet 37.5 mg .  isosorbide mononitrate (IMDUR) 24 hr tablet 30 mg .  rosuvastatin (CRESTOR) tablet 10 mg   Current Facility-Administered Medications (Respiratory):  .  guaiFENesin-dextromethorphan (ROBITUSSIN DM) 100-10  MG/5ML syrup 5 mL   Current Facility-Administered Medications (Analgesics):  .  acetaminophen (TYLENOL) 160 MG/5ML solution 650 mg .  [DISCONTINUED] acetaminophen (TYLENOL) tablet 650 mg **OR** [DISCONTINUED] acetaminophen (TYLENOL) 160 MG/5ML solution 650 mg **OR** acetaminophen (TYLENOL) suppository 650 mg  .  allopurinol (ZYLOPRIM) tablet 100 mg .  traMADol (ULTRAM) tablet 50 mg   Current Facility-Administered Medications (Hematological):  .  ferrous sulfate tablet 325 mg   Current Facility-Administered Medications (Other):  .  0.9 %  sodium chloride infusion (Manually program via Guardrails IV Fluids) .  0.9 %  sodium chloride infusion .  alum & mag hydroxide-simeth (MAALOX/MYLANTA) 200-200-20 MG/5ML suspension 15 mL .  bisacodyl (DULCOLAX) suppository 10 mg .  chlorhexidine gluconate (MEDLINE KIT) (PERIDEX) 0.12 % solution 15 mL .  docusate (COLACE) 50 MG/5ML liquid 100 mg .  famotidine (PEPCID) tablet 20 mg .  feeding supplement (ENSURE ENLIVE / ENSURE PLUS) liquid 237 mL .  gabapentin (NEURONTIN) 250 MG/5ML solution 300 mg .  Gerhardt's butt cream .  loperamide (IMODIUM) capsule 2 mg .  MEDLINE mouth rinse .  ondansetron (ZOFRAN) tablet 4 mg **OR** ondansetron (ZOFRAN) injection 4 mg  .  pantoprazole (PROTONIX) injection 40 mg .  polyethylene glycol (MIRALAX / GLYCOLAX) packet 17 g .  QUEtiapine (SEROQUEL) tablet 50 mg .  sodium chloride flush (NS) 0.9 % injection 3 mL .  sodium chloride flush (NS) 0.9 % injection 5 mL .  sucralfate (CARAFATE) 1 GM/10ML suspension 1 g .  tamsulosin (FLOMAX) capsule 0.4 mg .  Warfarin - Pharmacist Dosing Inpatient  Current Outpatient Medications (Other):  .  sodium chloride 0.9 % injection, Instill 5 mL daily into percutaneous drain.  Anti-infectives (From admission, onward)   Start     Dose/Rate Route Frequency Ordered Stop   03/21/20 1000  amoxicillin-clavulanate (AUGMENTIN) 875-125 MG per tablet 1 tablet        1 tablet Oral Every  12 hours 03/21/20 0719 03/28/20 2128   03/18/20 2200  amoxicillin-clavulanate (AUGMENTIN) 500-125 MG per tablet 500 mg  Status:  Discontinued        1 tablet Oral Every 12 hours 03/18/20 1459 03/18/20 1502   03/18/20 2200  amoxicillin-clavulanate (AUGMENTIN) 250-62.5 MG/5ML suspension 500 mg        500 mg Oral Every 12 hours 03/18/20 1502 03/20/20 2108   03/15/20 2200  piperacillin-tazobactam (ZOSYN) IVPB 3.375 g        3.375 g 12.5 mL/hr over 240 Minutes Intravenous Every 8 hours 03/15/20 1755 03/18/20 1852   03/15/20 1000  ceFEPIme (MAXIPIME) 2 g in sodium chloride 0.9 % 100 mL IVPB  Status:  Discontinued        2 g 200 mL/hr over 30 Minutes Intravenous Every 24 hours 03/14/20 1020 03/15/20 0843   03/15/20 0930  cefTRIAXone (ROCEPHIN) 2 g in sodium chloride 0.9 % 100 mL IVPB  Status:  Discontinued        2 g 200 mL/hr over 30 Minutes Intravenous Every 24 hours 03/15/20 0844 03/15/20 1755   03/14/20 1600  metroNIDAZOLE (FLAGYL) IVPB 500 mg  Status:  Discontinued        500 mg 100 mL/hr over 60 Minutes Intravenous Every 8 hours 03/14/20 1427 03/15/20 1755   03/14/20 0800  ceFEPIme (MAXIPIME) 2 g in sodium chloride 0.9 % 100 mL IVPB        2 g 200 mL/hr over 30 Minutes Intravenous  Once 03/14/20 0752 03/14/20 0845   03/14/20 0800  metroNIDAZOLE (FLAGYL) IVPB 500 mg        500 mg 100 mL/hr over 60 Minutes Intravenous  Once 03/14/20 0752 03/14/20 0935      Scheduled Meds: . sodium chloride   Intravenous Once  . acetaminophen (TYLENOL) oral liquid 160 mg/5 mL  650 mg Oral Q4H  . acetaminophen  650 mg Rectal Q4H  . allopurinol  100 mg Oral BID  . bisacodyl  10 mg Rectal Once  . chlorhexidine gluconate (MEDLINE KIT)  15 mL Mouth Rinse BID  . docusate  100 mg Oral BID  . famotidine  20 mg Oral BID  . feeding supplement  237 mL Oral TID BM  . ferrous sulfate  325 mg Oral Q breakfast  . furosemide  40 mg Oral q AM  . gabapentin  300 mg Oral Q12H  . hydrALAZINE  37.5 mg Oral Q8H  .  insulin aspart  0-20 Units Subcutaneous Q4H  . insulin glargine  30 Units Subcutaneous Daily  . isosorbide mononitrate  30 mg Oral Daily  .  mouth rinse  15 mL Mouth Rinse BID  . pantoprazole  40 mg Oral Daily  . polyethylene glycol  17 g Oral BID  . QUEtiapine  50 mg Oral Q supper  . rosuvastatin  10 mg Oral QHS  . sodium chloride flush  3 mL Intravenous Q12H  . sodium chloride flush  5 mL Intracatheter Q8H  . sucralfate  1 g Oral TID WC & HS  . tamsulosin  0.4 mg Oral QPC supper  . warfarin  5 mg Oral ONCE-1600  . Warfarin - Pharmacist Dosing Inpatient   Does not apply q1600   Continuous Infusions: . sodium chloride Stopped (03/17/20 2016)     LOS: 18 days  TIME: 60 minutes.   Para Skeans, MD [facility] Pager [phone] How to contact the [facility] Attending or Consulting provider [place] or covering provider during after hours [place], for this patient?    1. Check the care team in [facility] and look for a) attending/consulting [facility] provider listed and b) the [facility] team listed 2. Log into [email or web address] and use [place]'s universal password to access. If you do not have the password, please contact the hospital operator. 3. Locate the [facility] provider you are looking for under [facility] and page to a number that you can be directly reached. 4. If you still have difficulty reaching the provider, please page the [facility] (Director on Call) for the Hospitalists listed on amion for assistance. [email or web address] Password [facility] 04/01/2020, 5:34 PM

## 2020-04-01 NOTE — Progress Notes (Signed)
[facility] Gastroenterology Progress Note  PRYOR GUETTLER 79 y.o. 06-12-1940  CC:  Melena, anemia  Subjective: Patient had a black bowel movement today, no fever recalled.  Denies any abdominal pain, nausea, vomiting.  ROS : Review of Systems  Cardiovascular: Negative for chest pain and palpitations.  Gastrointestinal: Positive for melena. Negative for abdominal pain, blood in stool, constipation, diarrhea, heartburn, nausea and vomiting.   Objective: Vital signs in last 24 hours: Vitals:   04/01/20 1550 04/01/20 1555  BP:  (!) 137/59  Pulse: 71 70  Resp: (!) 26 (!) 22  Temp:  97.6 F (36.4 C)  SpO2: 99% 99%    Physical Exam:  General:  Lethargic, obese, cooperative, no distress, wearing CPAP  Head:  Normocephalic, without obvious abnormality, atraumatic  Eyes:  Anicteric sclera, EOMs intact   Lungs:   Clear to auscultation bilaterally, respirations unlabored  Heart:  Regular rate and rhythm, S1, S2 normal  Abdomen:   Soft, non-tender, bowel sounds active all four quadrants,  no masses,   Extremities: Extremities normal, atraumatic, no  edema  Pulses: 2+ and symmetric    Lab Results: Recent Labs    03/31/20 0033 04/01/20 0015  NA 139 141  K 5.1 4.9  CL 100 102  CO2 31 30  GLUCOSE 192* 91  BUN 43* 45*  CREATININE 1.32* 1.28*  CALCIUM 8.9 8.6*   No results for input(s): AST, ALT, ALKPHOS, BILITOT, PROT, ALBUMIN in the last 72 hours. Recent Labs    03/31/20 0033 04/01/20 0015  WBC 9.7 9.0  HGB 9.2* 8.8*  HCT 30.1* 28.9*  MCV 105.2* 107.0*  PLT 254 270   Recent Labs    03/31/20 0033 04/01/20 0015  LABPROT 16.5* 18.8*  INR 1.4* 1.6*      Assessment: Melena, anemia.    EGD 03/28/20: Erythematous mucosa in the gastric fundus, consistent with resolving stress gastritis. THIS, COUPLED WITH ANTICOAGULATION, WAS THE PATIENT'S LIKELY CAUSE OF RECENT BLEEDING. -Hgb 8.8 this morning, stable -INR 1.6 -BUN 45/Cr 1.28, stable  Sepsis, cholecystitis,  cholecystostomy tube in place  Acute on chronic heart failure, cardiogenic shock.  Last EF 20% as of 11/3  A fib, on Coumadin  Acute hypercarbic respiratory failure  Plan: Increase Protonix to twice daily.  Can use IV Protonix while patient is hospitalized, then transition to PO Protonix at discharge (or once melena has resolved).  Would recommend Protonix twice daily x2 months, followed by once daily dosing indefinitely.  Colonoscopy will likely be low yield, given patient's comorbidities, do not recommend proceeding with colonoscopy unless patient starts having progressive, destabilizing bleeding.  Patient states he does not want to proceed with colonoscopy unless necessary.  Repeat CBC today, as well as tomorrow morning to ensure it is stable.  Continue supportive care.  Continue to monitor H&H with transfusion as needed to maintain hemoglobin greater than 7.  [facility] will sign off.  Please contact us if we can be of any further assistance during his hospital stay.   Edrick Kins PA-C 04/01/2020, 5:02 PM  Contact #  [phone]

## 2020-04-01 NOTE — Progress Notes (Signed)
ANTICOAGULATION CONSULT NOTE - Follow-Up  Pharmacy Consult for warfarin Indication: atrial fibrillation  No Known Allergies  Patient Measurements: Height: 5\' 9"  (175.3 cm) Weight: 98.4 kg (216 lb 14.9 oz) IBW/kg (Calculated) : 70.7 Heparin Dosing Weight: 90.1 kg  Vital Signs: Temp: 98 F (36.7 C) (11/19 0753) Temp Source: Oral (11/19 0753) BP: 152/75 (11/19 0753) Pulse Rate: 89 (11/19 0753)  Labs: Recent Labs    03/30/20 0059 03/30/20 0059 03/30/20 2036 03/30/20 2036 03/31/20 0033 04/01/20 0015  HGB 7.8*   < > 9.6*   < > 9.2* 8.8*  HCT 25.9*   < > 30.9*  --  30.1* 28.9*  PLT 237  --   --   --  254 270  LABPROT 15.3*  --   --   --  16.5* 18.8*  INR 1.3*  --   --   --  1.4* 1.6*  CREATININE 1.32*  --   --   --  1.32* 1.28*   < > = values in this interval not displayed.    Estimated Creatinine Clearance: 54.1 mL/min (A) (by C-G formula based on SCr of 1.28 mg/dL (H)).   Medical History: Past Medical History:  Diagnosis Date  . Atrial fibrillation (HCC)    on Coumadin  . CAD (coronary artery disease)    s/p remote CABG, stent in 2019  . CHF (congestive heart failure) (HCC)   . Dementia (HCC)   . Diabetes mellitus   . DVT (deep venous thrombosis) (HCC)   . Fall at home 10/2015  . Hyperlipidemia   . Hypertension   . Left-sided carotid artery disease (HCC)   . MRSA infection   . Myocardial infarction (HCC)   . Obesity   . Peripheral vascular disease (HCC)   . Sleep apnea   . Stroke (HCC)   . Stroke (HCC)   . Venous insufficiency      Assessment: 44 you male with a history of PVD, CAD, HTN, HLD, DM and afib on warfarin presents with an NSTEMI. Pharmacy was consulted to dose heparin. PTA the patient was on warfarin at 5 mg daily with an INR goal of 2-3. It is unknown whether or not the patient was therapeutic while on this dose.  Endoscopy 11/15 showed no active bleeding, possible gastritis. GI added Carafate and patient cleared for anticoagulation.    INR is therapeutic at 1.4>1.6. Hgb 8.8, plt 270. No s/sx of bleeding. Missed dose last night due to Bipap requirement.  Goal of Therapy:  INR 2-3 Monitor platelets by anticoagulation protocol: Yes   Plan:  Warfarin 5mg  PO x1 tonight - continuing home dose Daily INR  12/15, PharmD, BCCCP Clinical Pharmacist  Phone: [phone] 04/01/2020 10:36 AM  Please check AMION for all [facility] Pharmacy phone numbers After 10:00 PM, call Main Pharmacy [phone]

## 2020-04-02 ENCOUNTER — Inpatient Hospital Stay (HOSPITAL_COMMUNITY): Payer: Medicare Other

## 2020-04-02 DIAGNOSIS — A419 Sepsis, unspecified organism: Secondary | ICD-10-CM | POA: Diagnosis not present

## 2020-04-02 LAB — PROTIME-INR
INR: 1.8 — ABNORMAL HIGH (ref 0.8–1.2)
INR: 1.7 — ABNORMAL HIGH (ref 0.8–1.2)
Prothrombin Time: 19.8 seconds — ABNORMAL HIGH (ref 11.4–15.2)
Prothrombin Time: 19.4 s — ABNORMAL HIGH (ref 11.4–15.2)

## 2020-04-02 LAB — BLOOD GAS, VENOUS
Acid-Base Excess: 7.2 mmol/L — ABNORMAL HIGH (ref 0.0–2.0)
Bicarbonate: 32.7 mmol/L — ABNORMAL HIGH (ref 20.0–28.0)
Drawn by: 1390
FIO2: 36
O2 Saturation: 99.4 %
Patient temperature: 37
pCO2, Ven: 61.5 mmHg — ABNORMAL HIGH (ref 44.0–60.0)
pH, Ven: 7.346 (ref 7.250–7.430)
pO2, Ven: 151 mmHg — ABNORMAL HIGH (ref 32.0–45.0)

## 2020-04-02 LAB — CBC
HCT: 27.3 % — ABNORMAL LOW (ref 39.0–52.0)
Hemoglobin: 8.3 g/dL — ABNORMAL LOW (ref 13.0–17.0)
MCH: 32.3 pg (ref 26.0–34.0)
MCHC: 30.4 g/dL (ref 30.0–36.0)
MCV: 106.2 fL — ABNORMAL HIGH (ref 80.0–100.0)
Platelets: 226 10*3/uL (ref 150–400)
RBC: 2.57 MIL/uL — ABNORMAL LOW (ref 4.22–5.81)
RDW: 17.9 % — ABNORMAL HIGH (ref 11.5–15.5)
WBC: 6.5 10*3/uL (ref 4.0–10.5)
nRBC: 0 % (ref 0.0–0.2)

## 2020-04-02 LAB — GLUCOSE, CAPILLARY
Glucose-Capillary: 125 mg/dL — ABNORMAL HIGH (ref 70–99)
Glucose-Capillary: 83 mg/dL (ref 70–99)
Glucose-Capillary: 169 mg/dL — ABNORMAL HIGH (ref 70–99)
Glucose-Capillary: 187 mg/dL — ABNORMAL HIGH (ref 70–99)
Glucose-Capillary: 159 mg/dL — ABNORMAL HIGH (ref 70–99)

## 2020-04-02 MED ORDER — WARFARIN SODIUM 5 MG PO TABS
5.0000 mg | ORAL_TABLET | Freq: Once | ORAL | Status: AC
  Administered 2020-04-02: 5 mg via ORAL
  Filled 2020-04-02: qty 1

## 2020-04-02 NOTE — Progress Notes (Addendum)
ANTICOAGULATION CONSULT NOTE - Follow-Up  Pharmacy Consult for warfarin Indication: atrial fibrillation  No Known Allergies  Patient Measurements: Height: 5\' 9"  (175.3 cm) Weight: 98.4 kg (216 lb 14.9 oz) IBW/kg (Calculated) : 70.7 Heparin Dosing Weight: 90.1 kg  Vital Signs: Temp: 98.2 F (36.8 C) (11/20 1135) Temp Source: Oral (11/20 1135) BP: 141/63 (11/20 1135) Pulse Rate: 80 (11/20 1135)  Labs: Recent Labs    03/31/20 0033 03/31/20 0033 04/01/20 0015 04/01/20 0015 04/01/20 1739 04/02/20 0454  HGB 9.2*   < > 8.8*   < > 8.7* 8.3*  HCT 30.1*   < > 28.9*  --  28.7* 27.3*  PLT 254   < > 270  --  272 226  LABPROT 16.5*  --  18.8*  --   --  19.8*  INR 1.4*  --  1.6*  --   --  1.8*  CREATININE 1.32*  --  1.28*  --   --   --    < > = values in this interval not displayed.    Estimated Creatinine Clearance: 54.1 mL/min (A) (by C-G formula based on SCr of 1.28 mg/dL (H)).   Medical History: Past Medical History:  Diagnosis Date  . Atrial fibrillation (HCC)    on Coumadin  . CAD (coronary artery disease)    s/p remote CABG, stent in 2019  . CHF (congestive heart failure) (HCC)   . Dementia (HCC)   . Diabetes mellitus   . DVT (deep venous thrombosis) (HCC)   . Fall at home 10/2015  . Hyperlipidemia   . Hypertension   . Left-sided carotid artery disease (HCC)   . MRSA infection   . Myocardial infarction (HCC)   . Obesity   . Peripheral vascular disease (HCC)   . Sleep apnea   . Stroke (HCC)   . Stroke (HCC)   . Venous insufficiency      Assessment: 89 you male with a history of PVD, CAD, HTN, HLD, DM and afib on warfarin presents with an NSTEMI. Pharmacy was consulted to dose heparin. PTA the patient was on warfarin at 5 mg daily with an INR goal of 2-3. It is unknown whether or not the patient was therapeutic while on this dose.  Endoscopy 11/15 showed no active bleeding, possible gastritis. GI added Carafate and patient cleared for anticoagulation.    INR is therapeutic at 1.6>1.8. Hgb 8.3, plt 226. No s/sx of bleeding. Missed dose last night due to Bipap requirement. Eating 75-80% of documented meals.   Goal of Therapy:  INR 2-3 Monitor platelets by anticoagulation protocol: Yes   Plan:  Warfarin 5mg  PO x1 tonight - continuing home dose Daily INR  12/15, PharmD, BCCCP Clinical Pharmacist  Phone: [phone] 04/02/2020 2:24 PM  Please check AMION for all [facility] Pharmacy phone numbers After 10:00 PM, call Main Pharmacy [phone]

## 2020-04-02 NOTE — Progress Notes (Addendum)
Oran Rein 1:03 PM  Subjective: Patient says he is doing much better in his hospital computer chart reviewed and his case discussed with my partners and his previous work-up was reviewed and currently he is eating fine and denies any GI concerns and has not seen any blood himself but does have some blood in his cholecystostomy drainage bag  Objective: Vital signs stable afebrile patient looks better than he was presented to me exam pertinent for his abdomen being soft nontender BUN and creatinine stable hemoglobin minimal decrease INR 1.8 recent CT okay except for gallbladder Assessment: Multiple medical problems including GI blood loss on blood thinners  Plan: Patient will need a colonoscopy at some point particularly if signs of GI blood loss compared to other sources like his cholecystostomy tube however would wait on cardiology to okay [place] proceeding and please call me this weekend if I could be of any further assistance  [facility]  office [phone] After 5PM or if no answer call [phone]

## 2020-04-02 NOTE — Progress Notes (Signed)
PROGRESS NOTE    [name]  GMW:[id number] DOB: 19-May-1940 DOA: 03/14/2020  PCP: [name], [name], MD    Brief Narrative:  Chief Complaint: Weakness, pressure ulcer  HPI: Isaiah [name] is a 79 y.o. male with medical history significant of PVD; CAD; HTN; HLD; DM; afib on Coumadin; and CHF presenting with weakness and a buttock pressure ulcer.  Everything was normal Friday, it was a great day.  Saturday AM, he was complaining of gas and bloating.  He sat in his lift chair during the day.  That evening, he was somewhat confused.  He didn't eat that day.  Sunday, he slept all day.  She tried to get him up and he was too weak to stand, mumbling, confused.  He is having RUQ abdominal pain.  No fever at home, although he was flushed.  He has had a pressure ulcer since back surgery a year ago, has a scab over it and it pulled off.      ED Course:  Concern for sepsis, AKI, likely cholecystitis.  Abdominal pain Friday, delirium and weakness over the weekend.  Febrile to 100.6, hypoxic on arrival.  Significant RUQ TTP.  Labs with AKI, leukocytosis.  CT with concern for gallbladder pathology.  Surgery to evaluate.  ?volume overload so not given 30 cc/kg bolus.  New cirrhosis on CT, negative NH4.  Myoclonic jerking, likely encephalopathy plus Neurontin with AKI.  Overview: Pt admitted on 03/14/2020 for Abdominal pain, sepsis and ct in ed showed layering gallstones c/w acute cholecystitis.Pt was seen by general surgery same day and started on maxipime and flagyl, and due to poor medical condition pt was offered percutaneous cholecystostomy tube, after INR is subtherapeutic, for which pt was given vit k. Plan was for cardiac clearance then proceeding to Cholecystomy in 8 weeks.   03/15/2020 :Cardiac arrest. While down in IR on 03/15/2020 pt had a brief cardiac arrest . Patient in IR for procedure, elected to perform under general anesthesia due to severe OSA, renal failure.  After intubation and  before Perc tube placement, patient became hypotensive and bradycardic, lost pulses.  CPR initiated, completed 1 round and 1 round of epi and pulses resumed. Levophed started, IV fluids. Pt was then trasnferred to ICU.  On 03/17/2011 pt had  successful placement of per chole by interventional radiology . Pt had echo done on 03/16/2020: Echocardiogram (03/16/2020) 1. Severe biventricular dysfunction with at least mild-moderate secondary  MR and TR.  2. Left ventricular ejection fraction, by estimation, is 20%. The left  ventricle has severely decreased function. The left ventricle demonstrates  global hypokinesis. There is mild left ventricular hypertrophy. Left  ventricular diastolic parameters are  indeterminate. There is the interventricular septum is flattened in  systole and diastole, consistent with right ventricular pressure and  volume overload.  3. Right ventricular systolic function is severely reduced. The right  ventricular size is severely enlarged.  4. Left atrial size was mild to moderately dilated.  5. Right atrial size was severely dilated.  6. The mitral valve is normal in structure, and demonstrates tenting due  to LV dysfunction. Mild to moderate mitral valve regurgitation. No  evidence of mitral stenosis.  7. Tricuspid valve regurgitation is mild to moderate.  8. The aortic valve is grossly normal. There is mild calcification of the  aortic valve. Aortic valve regurgitation is trivial. No aortic stenosis is  present.   Left Heart Cath: (2018)  Ost Cx to Prox Cx lesion, 100 %stenosed. SVG to  OM patent, with Y graft SVG to diagonal originating from mid portion of the OM graft. Moderate disease in the origin of the Y graft portion to the diagonal.  Mid LAD lesion, 70 %stenosed. Dist LAD lesion, 100 %stenosed. LIMA to LAD is widely patent.  Ost RCA to Prox RCA lesion, 80 %stenosed. A STENT XIENCE ALPINE RX [id number] drug eluting stent was successfully placed.  Post  intervention, there is a 0% residual stenosis.  LV end diastolic pressure is moderately elevated.  There is no aortic valve stenosis. Pt was extubated on 03/17/2020 and was following commands. Pt's heart failure was stable and off pressors and GDMT not initiated due to AKI and Bradycardia. Pt's elevated troponin was attributed demand ischemia.ANd for his atrial fibrillation with bradycardia Anticoagulation was d.c due to anemia. AKI was improving. Pt was transferred to [facility] team on the 6th. His heart failure was being managed with iv diuretics and decreased ef of 20% was attributed to post cardiac arrest. Pt remained in chronic a.fib and AC was held due to anemia and bleeding on cholecystostomy tube.Pt was also guaiac positive and therefore coumadin held. Pt was continued on iv abx for his septic shock from a/c cholecystitis.  Plan was for discharge on 03/22/2020 with IR drain and oxygen. Pt continued to get iv abx and PT OT was consulted and DME was delivered home for him. Gi was consulted for hie anemia  and guaiac positive  stool on 03/24/2020.  Pt was seen by cardiology dr.C and improved and became euvolemic and repeat echo as done and ef was improved to 55-60 % with grade ii diastolic dysfunction.  Pt s/p egd  11/15/2021which  Patchy mildly erythematous mucosa without bleeding was found in the  gastric fundus, resolving stress gastritis . Pt's creatinine also improved to 1.18 from 3.45. Per TOC note pt will need dme order.     Assessment & Plan: Principal Problem:   Sepsis due to undetermined organism [facility]) Active Problems:   Coronary artery disease   Chronic atrial fibrillation (HCC)   Essential hypertension   Hyperlipidemia   Type 2 diabetes mellitus with vascular disease (HCC)   Obstructive sleep apnea   CKD (chronic kidney disease), stage III ([place])   ARF (acute renal failure) (HCC)   Pressure injury of skin   Acute cholecystitis   Class 1 obesity due to excess calories with body  mass index (BMI) of 33.0 to 33.9 in adult   [facility])   Cardiac arrest ([place])   Acute systolic heart failure (HCC)   Biventricular failure ([place])   Anemia due to GI blood loss  Acute hypercarbic respiratory failure Notified by nursing this morning, patient lethargic with decreased mentation. Although was able to take some of his early morning medicines prior. Rapid response was initiated. Patient has been refusing nocturnal CPAP over the past 4 days. Patient was hemodynamically stable, SPO2 95% on 3 L nasal cannula. ABG with pH 7.25, PaCO2 75.7, PaO2 82.5. Etiology likely secondary to hypercarbic respiratory failure from CO2 retention related to noncompliance with nocturnal CPAP. --RT to place a BiPAP --Continue to closely monitor respiratory status --Repeat VBG at 1500 --Attempt to wean off BiPAP later this afternoon --Need to ensure he adheres to nocturnal CPAP --pt encouraged to use cpap but is intolerant to mask.  -- pt look so much more awake ands on nasal cannula, Blood pressure (!) 141/63, pulse 80, temperature 98.2 F (36.8 C), temperature source Oral, resp. rate 17, height $RemoveBe'5\' 9"'sFlVAmNmw$  (1.753 m),  weight 98.4 kg, SpO2 97 %.    Severe sepsis with septic shock-Resolved.  Patient presenting to the ED with fever, leukocytosis, renal failure and encephalopathy.  Shortly following admission, patient became hypotensive despite aggressive IV fluid resuscitation and was transferred to the ICU service for pressor support.  Patient was able to be weaned from vasopressors; and was transreferred back to the hospitalist service on 03/19/2020.   Acute cholecystitis Patient presenting to ED with right upper quadrant pain.  Imaging notable for distended gallbladder with layering gallstone and mild pericholestatic stranding.  Given severity of illness requiring ICU level of care and pressure support, patient underwent percutaneous cholecystostomy by IR on 03/16/2020. --WBC  21.2>>>12.7>10.6>10.3>10.2>10.0>9.7 --IV zosyn transitioned to Augmentin; completed 14 day course 11/15) --Continue cholecystostomy to gravity, flush 29mL TID; 19mL out past 24h --D/C home with PER Chole tube and Outpatient gen surg followup.   Acute on chronic combined systolic/diastolic congestive heart failure; decompensated with associated cardiogenic shock/PEA cardiac arrect Patient presenting with septic shock as above with associated cholecystitis with likely underlying cardiogenic shock requiring vasopressors and ventilatory support, with subsequent PEA arrest in the setting of percutaneous placement of gallbladder drain.  Extubated on 03/17/2020 and weaned off vasopressors.  TTE with severe biventricular dysfunction with LVEF 20% with severely dilated RA, moderately dilated LA. Repeat limited echocardiogram 03/26/2020 shows improvement of LVEF to 55-60%, with grade 2 diastolic dysfunction. --Cardiology signed off 11/16 --net negative 437mL past 24hrs and net negative 5.7L since admission --continue isosorbide mononitrate 30 mg p.o. daily --Hydralazine 37.5 mg q8h --restarted furosemide 40mg  PO daily --avoiding ACEi/ARB 2/2 AKI, and BB 2/2 bradycardia --Cardiology has singed off and we will start pt on low dose acei, BP below.  Blood pressure (!) 141/63, pulse 80, temperature 98.2 F (36.8 C), temperature source Oral, resp. rate 17, height 5\' 9"  (1.753 m), weight 98.4 kg, SpO2 97 %. -- low dose lisinopril at 2.5 mg po daily starting form today in evening.  Macrocytic Anemia/iron deficiency:  UGIB 2/2 stress gastritis --Iron 42, TIBC 253, Ferritin 118, foalte 16.6, B12 888, FOBT +  --Hgb 13.9>>>10.5>9.3>9.4>7.8>6.4>7.6>9.2>9.3>8.8>8.6>9.1>8.2>8.3>8.5>7.8>9.2 --s/p IV Feraheme 03/22/2020; continue home ferrous sulfate 325mg  PO daily --Protonix 40 mg PO daily --Carafate 1 g p.o. TIDAC/HS; continue  until discharge per GI --famotidine 20mg  PO BID --transfused 2u pRBC 11/11; 1u pRBC 11/12,  1u pRBC 11/17 --[facility] consulted, EGD 11/15 with no active bleeding, erythematous mucosa gastric fundus consistent with resolving stress gastritis. --CBC daily; transfuse to maintain hemoglobin greater than 8 --restart coumadin ok per GI and cardiology; recommendations w/o loading -- pt has been restarted on coumadin and he had a large melanotic BM today and if he continued to have melanotic or bloody stools he is to have LGI eva.    Hyperkalemia: RESOLVED. Potassium 5.1 today, s/p 10g Lokelma on 11/11, 5g Lokelma 11/12 and 11/15 and 10g Lokelma 10/16, Lokelna 10g PO 11/17 --Continue monitor on telemetry --restarted furosemide as above --Repeat electrolytes in a.m.  Acute renal failure on CKD stage IIIb: Presented with creatinine 3.46 with baseline roughly 1.6. etiology likely secondary to prerenal failure from dehydration, sepsis, and shock as above.  Now appears back to baseline. --Cr 3.45>>>1.25>1.21>1.44>1.34>1.18>1.28>1.32 --Avoid nephrotoxins, renally dose all medications --Closely monitor renal function daily. -- Lab Results  Component Value Date   CREATININE 1.28 (H) 04/01/2020   CREATININE 1.32 (H) 03/31/2020   CREATININE 1.32 (H) 03/30/2020   Creatinine continues to improve. Bmp pending today.  Chronic permanent atrial fibrillation Currently rate controlled.  On  Coumadin for anticoagulation at home.  Was also on IV heparin for NSTEMI during hospitalization.  Hospital course remarkable for bradycardia cardiac episodes and amiodarone was discontinued.  Not on beta-blockers. --INR 1.4 today --restarted coumadin 11/16 per GI and cardiology recommendations w/o loading --monitor INR daily. --inr today is 1.6 -- h/h is stable coumadin continued per pharmacy consult.   Acute metabolic encephalopathy:Secondary to sepsis. He is hard of hearing Resolved.   Cough:  Most likely secondary to congestive heart failure. No signs of pneumonia on chest xray.Cough  hasimproved./ Resolved but pt still on oxygen we will obtain ct chest without contrast. Ct chest not done still pending.  Type II NSTEMI/elevated troponin:  Supply demand ischemia from sepsis/septic shock. Cardiology followed with no plan for any intervention; now signed off. No change in meds currently.   Urinary retention:resolved. Continue intermittent straight cath if needed. Started on flomax. Continue to closely monitor urinary output.  Intake/Output Summary (Last 24 hours) at 04/02/2020 1356 Last data filed at 04/02/2020 1144 Gross per 24 hour  Intake 733 ml  Output 760 ml  Net -27 ml     Coronary artery disease s/p CABG/PCI with stents:  No complaint of chest pain. Continue Imdur. Follow-up with cardiology as an outpatient. --restarted coumadin as above. --If pt continued to have melanotic stools and or hb is dropping then coumadin to be stopped.  Hypertension: Currently blood pressure stable. Continue hydralazine, Imdur, aspirin and statin Blood pressure (!) 141/63, pulse 80, temperature 98.2 F (36.8 C), temperature source Oral, resp. rate 17, height $RemoveBe'5\' 9"'WRreYmjdH$  (1.753 m), weight 98.4 kg, SpO2 97 %. BP is stable no change in regimen.   Hyperlipidemia:  On Crestor Continue crestor.   Diabetes type 2:  Poorly controlled, hemoglobin A1c 12.1.  Diabetic coordinator was following. --Lantus 30 units [place] daily --Resistant NovoLog insulin sliding scale for coverage --CBGs qAC/HS --No change in regimen.  OSA:On CPAP at night. Encouraged him to cpap  tonight but the mask we have here is what he is intolerant to.    Constipation: Last bowel movement 3 days ago, complains of constipation and mild abdominal discomfort. Refused bisacodyl supp yesterday. --Colace 100 mg p.o. twice daily --Miralax BID --continue to closely monitor BM -- resolved.   Obesity: BMI of 33.  Counseled on need for weight loss/lifestyle changes this complicates all facets of  care. counseling when pt's acute issues are stable.  Debility/deconditioning/memory deficits:  PT/OT consulted skilled nursing facility on discharge. Patient lives with his daughter.On Seroquel at home. Daughter Butch Penny wants to take him home with home health. Anticipate d/c in am  Stage II pressure injury, buttocks; POA- No change.  Pressure Injury 03/14/20 Buttocks Right Stage 2 -  Partial thickness loss of dermis presenting as a shallow open injury with a red, pink wound bed without slough. (Active)  03/14/20 2302  Location: Buttocks  Location Orientation: Right  Staging: Stage 2 -  Partial thickness loss of dermis presenting as a shallow open injury with a red, pink wound bed without slough.  Wound Description (Comments):   Present on Admission: Yes     Pressure Injury 03/14/20 Buttocks Left Stage 2 -  Partial thickness loss of dermis presenting as a shallow open injury with a red, pink wound bed without slough. (Active)  03/14/20 2300  Location: Buttocks  Location Orientation: Left  Staging: Stage 2 -  Partial thickness loss of dermis presenting as a shallow open injury with a red, pink wound bed without slough.  Wound Description (  Comments):   Present on Admission: Yes     Pressure Injury 03/28/20 Ear Right Stage 2 -  Partial thickness loss of dermis presenting as a shallow open injury with a red, pink wound bed without slough. (Active)  03/28/20 1100  Location: Ear  Location Orientation: Right  Staging: Stage 2 -  Partial thickness loss of dermis presenting as a shallow open injury with a red, pink wound bed without slough.  Wound Description (Comments):   Present on Admission: No  Wound care consulted and were following.  Continue local wound care, offloading.       DVT prophylaxis:  SCD'd  Code Status:  Full.  Family Communication:  Education officer, museum at [employer].  Disposition Plan:  Status is: Inpatient Remains inpatient appropriate because:Unsafe d/c plan,  IV treatments appropriate due to intensity of illness or inability to take PO and Inpatient level of care appropriate due to severity of illness  Dispo: The patient is from: Home  Anticipated d/c is to: Home  Anticipated d/c date is: 1 day  Patient currently is not medically stable to d/c. Altered this morning due to hypercarbic respiratory failure from noncompliance with nocturnal CPAP, now placed on BiPAP.   Consultants:   PCCM  Interventional radiology  General surgery  Cardiology - signed off 11/16  [facility] gastroenterology, Dr. Paulita Fujita, Dr. Cristina Gong  Procedures:   IR placement cholecystostomy on 03/17/2020  EGD 11/15  BiPAP 11/18  Subjective: Pt today is on oxygen and is alert but confused. Plan is for d/c with home health.  He is still on oxygen and not on oxygen at baseline. D/w daughter that I will review chart and consider ct chest whithout out contrast. D?w her that we will probably d/c tomorrow.  Per gi and cardiology note pt can restart coumadin.Per abg pt is still hypercarbic.  Cont with cpap overnight and d/c in am.h/h is stable at 8.8. We will obtain ct chest and follow and d/c in am  if pt is stable .   Lab Results  Component Value Date   CREATININE 1.28 (H) 04/01/2020   CREATININE 1.32 (H) 03/31/2020   CREATININE 1.32 (H) 03/30/2020  Creatinine is improving , and will avoid renally dose meds and contrast studies.     Objective: Vitals:   04/02/20 0331 04/02/20 0632 04/02/20 0735 04/02/20 1135  BP:  (!) 125/53 (!) 126/59 (!) 141/63  Pulse: 61  77 80  Resp: 15   17  Temp:   98.2 F (36.8 C) 98.2 F (36.8 C)  TempSrc:   Oral Oral  SpO2: 98%  92% 97%  Weight:      Height:       SpO2: 97 % O2 Flow Rate (L/min): 4 L/min FiO2 (%): 40 %  Intake/Output Summary (Last 24 hours) at 04/02/2020 1356 Last data filed at 04/02/2020 1144 Gross per 24 hour  Intake 733 ml  Output 760 ml  Net -27 ml   Filed Weights    03/18/20 0400 03/21/20 1217 03/24/20 0500  Weight: 98.5 kg 102.2 kg 98.4 kg    Examination: Blood pressure (!) 141/63, pulse 80, temperature 98.2 F (36.8 C), temperature source Oral, resp. rate 17, height $RemoveBe'5\' 9"'[id number]$  (1.753 m), weight 98.4 kg, SpO2 97 %. Physical Exam Constitutional:      General: He is awake.     Appearance: Normal appearance. He is obese.     Interventions: Nasal cannula in place.  HENT:     Head: Normocephalic.     Right Ear:  Hearing and external ear normal.     Left Ear: Hearing and external ear normal.     Nose: Nose normal.     Mouth/Throat:     Lips: Pink.     Mouth: Mucous membranes are dry.     Tongue: No lesions. Tongue does not deviate from midline.     Palate: No mass and lesions.     Pharynx: Oropharynx is clear.  Eyes:     General: Lids are normal.     Extraocular Movements: Extraocular movements intact.     Conjunctiva/sclera: Conjunctivae normal.  Neck:     Vascular: No carotid bruit.  Cardiovascular:     Rate and Rhythm: Normal rate. Rhythm irregular.     Pulses: Normal pulses.  Pulmonary:     Effort: Pulmonary effort is normal. No tachypnea, bradypnea, accessory muscle usage, prolonged expiration, respiratory distress or retractions.     Breath sounds: Normal breath sounds and air entry. No decreased air movement.  Abdominal:     General: Bowel sounds are normal. There is no distension or abdominal bruit. There are no signs of injury.     Palpations: Abdomen is soft.  Musculoskeletal:     Cervical back: Full passive range of motion without pain.     Right lower leg: No edema.  Neurological:     Mental Status: He is alert and oriented to person, place, and time.     Cranial Nerves: Cranial nerves are intact.  Psychiatric:        Behavior: Behavior is cooperative.     Data Reviewed:  I/O last 3 completed shifts: In: 322 [P.O.:720; I.V.:23; Other:10] Out: 2110 [Urine:2100; Drains:10] Total I/O In: 480 [P.O.:480] Out: 200  [Urine:200] Lab Results  Component Value Date   CREATININE 1.28 (H) 04/01/2020   CREATININE 1.32 (H) 03/31/2020   CREATININE 1.32 (H) 03/30/2020   CBC: Recent Labs  Lab 03/30/20 0059 03/30/20 0059 03/30/20 2036 03/31/20 0033 04/01/20 0015 04/01/20 1739 04/02/20 0454  WBC 10.0  --   --  9.7 9.0 7.8 6.5  HGB 7.8*   < > 9.6* 9.2* 8.8* 8.7* 8.3*  HCT 25.9*   < > 30.9* 30.1* 28.9* 28.7* 27.3*  MCV 109.7*  --   --  105.2* 107.0* 105.9* 106.2*  PLT 237  --   --  254 270 272 226   < > = values in this interval not displayed.   Basic Metabolic Panel: Recent Labs  Lab 03/28/20 0145 03/28/20 0145 03/29/20 0116 03/29/20 0116 03/29/20 0811 03/29/20 1701 03/30/20 0059 03/31/20 0033 04/01/20 0015  NA 134*  --  134*  --   --   --  138 139 141  K 5.0   < > 6.2*   < > 5.8* 5.4* 5.3* 5.1 4.9  CL 99  --  98  --   --   --  102 100 102  CO2 30  --  29  --   --   --  $R'30 31 30  'iV$ GLUCOSE 98  --  160*  --   --   --  209* 192* 91  BUN 35*  --  38*  --   --   --  40* 43* 45*  CREATININE 1.18  --  1.28*  --   --   --  1.32* 1.32* 1.28*  CALCIUM 8.5*  --  8.7*  --   --   --  8.9 8.9 8.6*   < > = values in this interval  not displayed.   GFR: Estimated Creatinine Clearance: 54.1 mL/min (A) (by C-G formula based on SCr of 1.28 mg/dL (H)). Liver Function Tests: No results for input(s): AST, ALT, ALKPHOS, BILITOT, PROT, ALBUMIN in the last 168 hours. No results for input(s): LIPASE, AMYLASE in the last 168 hours. No results for input(s): AMMONIA in the last 168 hours.  Coagulation Profile: Recent Labs  Lab 03/29/20 0116 03/30/20 0059 03/31/20 0033 04/01/20 0015 04/02/20 0454  INR 1.3* 1.3* 1.4* 1.6* 1.8*   Cardiac Enzymes: No results for input(s): CKTOTAL, CKMB, CKMBINDEX, TROPONINI in the last 168 hours. BNP (last 3 results) No results for input(s): PROBNP in the last 8760 hours. HbA1C: No results for input(s): HGBA1C in the last 72 hours. CBG: Recent Labs  Lab 04/01/20 1940  04/01/20 2331 04/02/20 0320 04/02/20 0738 04/02/20 1143  GLUCAP 143* 117* 125* 83 169*   Lipid Profile: No results for input(s): CHOL, HDL, LDLCALC, TRIG, CHOLHDL, LDLDIRECT in the last 72 hours. Thyroid Function Tests: No results for input(s): TSH, T4TOTAL, FREET4, T3FREE, THYROIDAB in the last 72 hours. Anemia Panel: No results for input(s): VITAMINB12, FOLATE, FERRITIN, TIBC, IRON, RETICCTPCT in the last 72 hours. Sepsis Labs: No results for input(s): PROCALCITON, LATICACIDVEN in the last 168 hours.  No results found for this or any previous visit (from the past 240 hour(s)).    Radiology Studies: No results found.    Current Facility-Administered Medications (Endocrine & Metabolic):  .  insulin aspart (novoLOG) injection 0-20 Units .  insulin glargine (LANTUS) injection 30 Units   Current Facility-Administered Medications (Cardiovascular):  .  furosemide (LASIX) tablet 40 mg .  hydrALAZINE (APRESOLINE) injection 5 mg .  hydrALAZINE (APRESOLINE) tablet 37.5 mg .  isosorbide mononitrate (IMDUR) 24 hr tablet 30 mg .  rosuvastatin (CRESTOR) tablet 10 mg   Current Facility-Administered Medications (Respiratory):  .  guaiFENesin-dextromethorphan (ROBITUSSIN DM) 100-10 MG/5ML syrup 5 mL   Current Facility-Administered Medications (Analgesics):  .  acetaminophen (TYLENOL) 160 MG/5ML solution 650 mg .  [DISCONTINUED] acetaminophen (TYLENOL) tablet 650 mg **OR** [DISCONTINUED] acetaminophen (TYLENOL) 160 MG/5ML solution 650 mg **OR** acetaminophen (TYLENOL) suppository 650 mg  .  allopurinol (ZYLOPRIM) tablet 100 mg .  traMADol (ULTRAM) tablet 50 mg   Current Facility-Administered Medications (Hematological):  .  ferrous sulfate tablet 325 mg   Current Facility-Administered Medications (Other):  .  0.9 %  sodium chloride infusion (Manually program via Guardrails IV Fluids) .  0.9 %  sodium chloride infusion .  alum & mag hydroxide-simeth (MAALOX/MYLANTA) 200-200-20  MG/5ML suspension 15 mL .  bisacodyl (DULCOLAX) suppository 10 mg .  chlorhexidine gluconate (MEDLINE KIT) (PERIDEX) 0.12 % solution 15 mL .  docusate (COLACE) 50 MG/5ML liquid 100 mg .  famotidine (PEPCID) tablet 20 mg .  feeding supplement (ENSURE ENLIVE / ENSURE PLUS) liquid 237 mL .  gabapentin (NEURONTIN) 250 MG/5ML solution 300 mg .  Gerhardt's butt cream .  loperamide (IMODIUM) capsule 2 mg .  MEDLINE mouth rinse .  ondansetron (ZOFRAN) tablet 4 mg **OR** ondansetron (ZOFRAN) injection 4 mg  .  pantoprazole (PROTONIX) injection 40 mg .  polyethylene glycol (MIRALAX / GLYCOLAX) packet 17 g .  QUEtiapine (SEROQUEL) tablet 50 mg .  sodium chloride flush (NS) 0.9 % injection 3 mL .  sodium chloride flush (NS) 0.9 % injection 5 mL .  sucralfate (CARAFATE) 1 GM/10ML suspension 1 g .  tamsulosin (FLOMAX) capsule 0.4 mg .  Warfarin - Pharmacist Dosing Inpatient  Current Outpatient Medications (Other):  .  sodium chloride 0.9 % injection, Instill 5 mL daily into percutaneous drain.  Anti-infectives (From admission, onward)   Start     Dose/Rate Route Frequency Ordered Stop   03/21/20 1000  amoxicillin-clavulanate (AUGMENTIN) 875-125 MG per tablet 1 tablet        1 tablet Oral Every 12 hours 03/21/20 0719 03/28/20 2128   03/18/20 2200  amoxicillin-clavulanate (AUGMENTIN) 500-125 MG per tablet 500 mg  Status:  Discontinued        1 tablet Oral Every 12 hours 03/18/20 1459 03/18/20 1502   03/18/20 2200  amoxicillin-clavulanate (AUGMENTIN) 250-62.5 MG/5ML suspension 500 mg        500 mg Oral Every 12 hours 03/18/20 1502 03/20/20 2108   03/15/20 2200  piperacillin-tazobactam (ZOSYN) IVPB 3.375 g        3.375 g 12.5 mL/hr over 240 Minutes Intravenous Every 8 hours 03/15/20 1755 03/18/20 1852   03/15/20 1000  ceFEPIme (MAXIPIME) 2 g in sodium chloride 0.9 % 100 mL IVPB  Status:  Discontinued        2 g 200 mL/hr over 30 Minutes Intravenous Every 24 hours 03/14/20 1020 03/15/20 0843    03/15/20 0930  cefTRIAXone (ROCEPHIN) 2 g in sodium chloride 0.9 % 100 mL IVPB  Status:  Discontinued        2 g 200 mL/hr over 30 Minutes Intravenous Every 24 hours 03/15/20 0844 03/15/20 1755   03/14/20 1600  metroNIDAZOLE (FLAGYL) IVPB 500 mg  Status:  Discontinued        500 mg 100 mL/hr over 60 Minutes Intravenous Every 8 hours 03/14/20 1427 03/15/20 1755   03/14/20 0800  ceFEPIme (MAXIPIME) 2 g in sodium chloride 0.9 % 100 mL IVPB        2 g 200 mL/hr over 30 Minutes Intravenous  Once 03/14/20 0752 03/14/20 0845   03/14/20 0800  metroNIDAZOLE (FLAGYL) IVPB 500 mg        500 mg 100 mL/hr over 60 Minutes Intravenous  Once 03/14/20 0752 03/14/20 0935      Scheduled Meds: . sodium chloride   Intravenous Once  . acetaminophen (TYLENOL) oral liquid 160 mg/5 mL  650 mg Oral Q4H  . acetaminophen  650 mg Rectal Q4H  . allopurinol  100 mg Oral BID  . bisacodyl  10 mg Rectal Once  . chlorhexidine gluconate (MEDLINE KIT)  15 mL Mouth Rinse BID  . docusate  100 mg Oral BID  . famotidine  20 mg Oral BID  . feeding supplement  237 mL Oral TID BM  . ferrous sulfate  325 mg Oral Q breakfast  . furosemide  40 mg Oral q AM  . gabapentin  300 mg Oral Q12H  . hydrALAZINE  37.5 mg Oral Q8H  . insulin aspart  0-20 Units Subcutaneous Q4H  . insulin glargine  30 Units Subcutaneous Daily  . isosorbide mononitrate  30 mg Oral Daily  . mouth rinse  15 mL Mouth Rinse BID  . pantoprazole  40 mg Oral Daily  . polyethylene glycol  17 g Oral BID  . QUEtiapine  50 mg Oral Q supper  . rosuvastatin  10 mg Oral QHS  . sodium chloride flush  3 mL Intravenous Q12H  . sodium chloride flush  5 mL Intracatheter Q8H  . sucralfate  1 g Oral TID WC & HS  . tamsulosin  0.4 mg Oral QPC supper  . warfarin  5 mg Oral ONCE-1600  . Warfarin - Pharmacist Dosing Inpatient   Does  not apply q1600   Continuous Infusions: . sodium chloride Stopped (03/17/20 2016)     LOS: 19 days  TIME: 60 minutes.   Para Skeans,  MD [facility] Pager [phone] How to contact the [facility] Attending or Consulting provider [place] or covering provider during after hours [place], for this patient?    1. Check the care team in [facility] and look for a) attending/consulting [facility] provider listed and b) the [facility] team listed 2. Log into [email or web address] and use [place]'s universal password to access. If you do not have the password, please contact the hospital operator. 3. Locate the [facility] provider you are looking for under [facility] and page to a number that you can be directly reached. 4. If you still have difficulty reaching the provider, please page the [facility] (Director on Call) for the Hospitalists listed on amion for assistance. [email or web address] Password [email or web address] 04/02/2020, 1:56 PM

## 2020-04-03 DIAGNOSIS — A419 Sepsis, unspecified organism: Secondary | ICD-10-CM | POA: Diagnosis not present

## 2020-04-03 LAB — CBC
HCT: 29.9 % — ABNORMAL LOW (ref 39.0–52.0)
Hemoglobin: 9 g/dL — ABNORMAL LOW (ref 13.0–17.0)
MCH: 32.6 pg (ref 26.0–34.0)
MCHC: 30.1 g/dL (ref 30.0–36.0)
MCV: 108.3 fL — ABNORMAL HIGH (ref 80.0–100.0)
Platelets: 235 10*3/uL (ref 150–400)
RBC: 2.76 MIL/uL — ABNORMAL LOW (ref 4.22–5.81)
RDW: 17.6 % — ABNORMAL HIGH (ref 11.5–15.5)
WBC: 7.1 10*3/uL (ref 4.0–10.5)
nRBC: 0 % (ref 0.0–0.2)

## 2020-04-03 LAB — COMPREHENSIVE METABOLIC PANEL
ALT: 14 U/L (ref 0–44)
AST: 20 U/L (ref 15–41)
Albumin: 2.6 g/dL — ABNORMAL LOW (ref 3.5–5.0)
Alkaline Phosphatase: 131 U/L — ABNORMAL HIGH (ref 38–126)
Anion gap: 8 (ref 5–15)
BUN: 42 mg/dL — ABNORMAL HIGH (ref 8–23)
CO2: 30 mmol/L (ref 22–32)
Calcium: 8.9 mg/dL (ref 8.9–10.3)
Chloride: 102 mmol/L (ref 98–111)
Creatinine, Ser: 1.23 mg/dL (ref 0.61–1.24)
GFR, Estimated: 60 mL/min — ABNORMAL LOW (ref >60)
Glucose, Bld: 111 mg/dL — ABNORMAL HIGH (ref 70–99)
Potassium: 4.4 mmol/L (ref 3.5–5.1)
Sodium: 140 mmol/L (ref 135–145)
Total Bilirubin: 0.3 mg/dL (ref 0.3–1.2)
Total Protein: 6.9 g/dL (ref 6.5–8.1)

## 2020-04-03 LAB — MAGNESIUM: Magnesium: 2.3 mg/dL (ref 1.7–2.4)

## 2020-04-03 LAB — PHOSPHORUS: Phosphorus: 3.9 mg/dL (ref 2.5–4.6)

## 2020-04-03 LAB — GLUCOSE, CAPILLARY
Glucose-Capillary: 112 mg/dL — ABNORMAL HIGH (ref 70–99)
Glucose-Capillary: 119 mg/dL — ABNORMAL HIGH (ref 70–99)
Glucose-Capillary: 153 mg/dL — ABNORMAL HIGH (ref 70–99)
Glucose-Capillary: 154 mg/dL — ABNORMAL HIGH (ref 70–99)
Glucose-Capillary: 156 mg/dL — ABNORMAL HIGH (ref 70–99)
Glucose-Capillary: 162 mg/dL — ABNORMAL HIGH (ref 70–99)

## 2020-04-03 LAB — PROTIME-INR
INR: 1.7 — ABNORMAL HIGH (ref 0.8–1.2)
Prothrombin Time: 19.2 seconds — ABNORMAL HIGH (ref 11.4–15.2)

## 2020-04-03 MED ORDER — WARFARIN SODIUM 3 MG PO TABS
6.0000 mg | ORAL_TABLET | Freq: Once | ORAL | Status: AC
  Administered 2020-04-03: 6 mg via ORAL
  Filled 2020-04-03: qty 2

## 2020-04-03 MED ORDER — FUROSEMIDE 10 MG/ML IJ SOLN
20.0000 mg | Freq: Two times a day (BID) | INTRAMUSCULAR | Status: AC
  Administered 2020-04-03 – 2020-04-04 (×2): 20 mg via INTRAVENOUS
  Filled 2020-04-03 (×2): qty 2

## 2020-04-03 NOTE — Progress Notes (Addendum)
ANTICOAGULATION CONSULT NOTE - Follow-Up  Pharmacy Consult for warfarin Indication: atrial fibrillation  No Known Allergies  Patient Measurements: Height: 5\' 9"  (175.3 cm) Weight: 98.4 kg (216 lb 14.9 oz) IBW/kg (Calculated) : 70.7 Heparin Dosing Weight: 90.1 kg  Vital Signs: Temp: 97.6 F (36.4 C) (11/21 0715) Temp Source: Oral (11/21 0715) BP: 127/55 (11/21 0715) Pulse Rate: 68 (11/21 0715)  Labs: Recent Labs    04/01/20 0015 04/01/20 0015 04/01/20 1739 04/02/20 0454 04/02/20 1501 04/03/20 0054 04/03/20 0746  HGB 8.8*   < > 8.7* 8.3*  --   --  9.0*  HCT 28.9*   < > 28.7* 27.3*  --   --  29.9*  PLT 270   < > 272 226  --   --  235  LABPROT 18.8*   < >  --  19.8* 19.4* 19.2*  --   INR 1.6*   < >  --  1.8* 1.7* 1.7*  --   CREATININE 1.28*  --   --   --   --   --  1.23   < > = values in this interval not displayed.    Estimated Creatinine Clearance: 56.3 mL/min (by C-G formula based on SCr of 1.23 mg/dL).   Medical History: Past Medical History:  Diagnosis Date  . Atrial fibrillation (HCC)    on Coumadin  . CAD (coronary artery disease)    s/p remote CABG, stent in 2019  . CHF (congestive heart failure) (HCC)   . Dementia (HCC)   . Diabetes mellitus   . DVT (deep venous thrombosis) (HCC)   . Fall at home 10/2015  . Hyperlipidemia   . Hypertension   . Left-sided carotid artery disease (HCC)   . MRSA infection   . Myocardial infarction (HCC)   . Obesity   . Peripheral vascular disease (HCC)   . Sleep apnea   . Stroke (HCC)   . Stroke (HCC)   . Venous insufficiency      Assessment: 89 you male with a history of PVD, CAD, HTN, HLD, DM and afib on warfarin presents with an NSTEMI. Pharmacy was consulted to dose heparin. PTA the patient was on warfarin at 5 mg daily with an INR goal of 2-3. It is unknown whether or not the patient was therapeutic while on this dose.  Endoscopy 11/15 showed no active bleeding, possible gastritis. GI added Carafate and  patient cleared for anticoagulation.   INR remains subtherapeutic at 1.7. Hgb 9, plt 235. No s/sx of bleeding. Eating 80-100% of documented meals. Still requiring BiPAP at times.   Goal of Therapy:  INR 2-3 Monitor platelets by anticoagulation protocol: Yes   Plan:  Warfarin  6 mg PO x1 tonight  Daily INR  12/15, PharmD, BCCCP Clinical Pharmacist  Phone: [phone] 04/03/2020 2:00 PM  Please check AMION for all [facility] Pharmacy phone numbers After 10:00 PM, call Main Pharmacy [phone]

## 2020-04-03 NOTE — Progress Notes (Signed)
PROGRESS NOTE    Isaiah [name]  GMW:[id number] DOB: 19-May-1940 DOA: 03/14/2020  PCP: [name], [name], MD    Brief Narrative:  Chief Complaint: Weakness, pressure ulcer  HPI: Isaiah [name] is a 79 y.o. male with medical history significant of PVD; CAD; HTN; HLD; DM; afib on Coumadin; and CHF presenting with weakness and a buttock pressure ulcer.  Everything was normal Friday, it was a great day.  Saturday AM, he was complaining of gas and bloating.  He sat in his lift chair during the day.  That evening, he was somewhat confused.  He didn't eat that day.  Sunday, he slept all day.  She tried to get him up and he was too weak to stand, mumbling, confused.  He is having RUQ abdominal pain.  No fever at home, although he was flushed.  He has had a pressure ulcer since back surgery a year ago, has a scab over it and it pulled off.      ED Course:  Concern for sepsis, AKI, likely cholecystitis.  Abdominal pain Friday, delirium and weakness over the weekend.  Febrile to 100.6, hypoxic on arrival.  Significant RUQ TTP.  Labs with AKI, leukocytosis.  CT with concern for gallbladder pathology.  Surgery to evaluate.  ?volume overload so not given 30 cc/kg bolus.  New cirrhosis on CT, negative NH4.  Myoclonic jerking, likely encephalopathy plus Neurontin with AKI.  Overview: Pt admitted on 03/14/2020 for Abdominal pain, sepsis and ct in ed showed layering gallstones c/w acute cholecystitis.Pt was seen by general surgery same day and started on maxipime and flagyl, and due to poor medical condition pt was offered percutaneous cholecystostomy tube, after INR is subtherapeutic, for which pt was given vit k. Plan was for cardiac clearance then proceeding to Cholecystomy in 8 weeks.   03/15/2020 :Cardiac arrest. While down in IR on 03/15/2020 pt had a brief cardiac arrest . Patient in IR for procedure, elected to perform under general anesthesia due to severe OSA, renal failure.  After intubation and  before Perc tube placement, patient became hypotensive and bradycardic, lost pulses.  CPR initiated, completed 1 round and 1 round of epi and pulses resumed. Levophed started, IV fluids. Pt was then trasnferred to ICU.  On 03/17/2011 pt had  successful placement of per chole by interventional radiology . Pt had echo done on 03/16/2020: Echocardiogram (03/16/2020) 1. Severe biventricular dysfunction with at least mild-moderate secondary  MR and TR.  2. Left ventricular ejection fraction, by estimation, is 20%. The left  ventricle has severely decreased function. The left ventricle demonstrates  global hypokinesis. There is mild left ventricular hypertrophy. Left  ventricular diastolic parameters are  indeterminate. There is the interventricular septum is flattened in  systole and diastole, consistent with right ventricular pressure and  volume overload.  3. Right ventricular systolic function is severely reduced. The right  ventricular size is severely enlarged.  4. Left atrial size was mild to moderately dilated.  5. Right atrial size was severely dilated.  6. The mitral valve is normal in structure, and demonstrates tenting due  to LV dysfunction. Mild to moderate mitral valve regurgitation. No  evidence of mitral stenosis.  7. Tricuspid valve regurgitation is mild to moderate.  8. The aortic valve is grossly normal. There is mild calcification of the  aortic valve. Aortic valve regurgitation is trivial. No aortic stenosis is  present.   Left Heart Cath: (2018)  Ost Cx to Prox Cx lesion, 100 %stenosed. SVG to  OM patent, with Y graft SVG to diagonal originating from mid portion of the OM graft. Moderate disease in the origin of the Y graft portion to the diagonal.  Mid LAD lesion, 70 %stenosed. Dist LAD lesion, 100 %stenosed. LIMA to LAD is widely patent.  Ost RCA to Prox RCA lesion, 80 %stenosed. A STENT XIENCE ALPINE RX [id number] drug eluting stent was successfully placed.  Post  intervention, there is a 0% residual stenosis.  LV end diastolic pressure is moderately elevated.  There is no aortic valve stenosis. Pt was extubated on 03/17/2020 and was following commands. Pt's heart failure was stable and off pressors and GDMT not initiated due to AKI and Bradycardia. Pt's elevated troponin was attributed demand ischemia.ANd for his atrial fibrillation with bradycardia Anticoagulation was d.c due to anemia. AKI was improving. Pt was transferred to [facility] on the 6th. His heart failure was being managed with iv diuretics and decreased ef of 20% was attributed to post cardiac arrest. Pt remained in chronic a.fib and AC was held due to anemia and bleeding on cholecystostomy tube.Pt was also guaiac positive and therefore coumadin held. Pt was continued on iv abx for his septic shock from a/c cholecystitis.  Plan was for discharge on 03/22/2020 with IR drain and oxygen. Pt continued to get iv abx and PT OT was consulted and DME was delivered home for him. Gi was consulted for hie anemia  and guaiac positive  stool on 03/24/2020.  Pt was seen by cardiology dr.C and improved and became euvolemic and repeat echo as done and ef was improved to 55-60 % with grade ii diastolic dysfunction.  Pt s/p egd  11/15/2021which  Patchy mildly erythematous mucosa without bleeding was found in the  gastric fundus, resolving stress gastritis . Pt's creatinine also improved to 1.18 from 3.45. Per TOC note pt will need dme order.     Assessment & Plan: Principal Problem:   Sepsis due to undetermined organism [facility]) Active Problems:   Coronary artery disease   Chronic atrial fibrillation (HCC)   Essential hypertension   Hyperlipidemia   Type 2 diabetes mellitus with vascular disease (HCC)   Obstructive sleep apnea   CKD (chronic kidney disease), stage III ([place])   ARF (acute renal failure) (HCC)   Pressure injury of skin   Acute cholecystitis   Class 1 obesity due to excess calories with body  mass index (BMI) of 33.0 to 33.9 in adult   [facility])   Cardiac arrest ([place])   Acute systolic heart failure (HCC)   Biventricular failure ([place])   Anemia due to GI blood loss  Acute hypercarbic respiratory failure Notified by nursing this morning, patient lethargic with decreased mentation. Although was able to take some of his early morning medicines prior. Rapid response was initiated. Patient has been refusing nocturnal CPAP over the past 4 days. Patient was hemodynamically stable, SPO2 95% on 3 L nasal cannula. ABG with pH 7.25, PaCO2 75.7, PaO2 82.5. Etiology likely secondary to hypercarbic respiratory failure from CO2 retention related to noncompliance with nocturnal CPAP. --RT to place a BiPAP --Continue to closely monitor respiratory status --Repeat VBG at 1500 --Attempt to wean off BiPAP later this afternoon --Need to ensure he adheres to nocturnal CPAP --pt encouraged to use cpap but is intolerant to mask.  -- pt look so much more awake ands on nasal cannula, --SpO2: 98 % O2 Flow Rate (L/min): 4 L/min FiO2 (%): 40 % Pt is stable on 4L [place].Cont to use CPAP  overnight.   Acute cholecystitis Patient presenting to ED with right upper quadrant pain.  Imaging notable for distended gallbladder with layering gallstone and mild pericholestatic stranding.  Given severity of illness requiring ICU level of care and pressure support, patient underwent percutaneous cholecystostomy by IR on 03/16/2020. --WBC 21.2>>>12.7>10.6>10.3>10.2>10.0>9.7 --IV zosyn transitioned to Augmentin; completed 14 day course 11/15) --Continue cholecystostomy to gravity, flush 71mL TID; 43mL out past 24h --D/C home with PER Chole tube and Outpatient gen surg followup.   Acute on chronic combined systolic/diastolic congestive heart failure; decompensated with associated cardiogenic shock/PEA cardiac arrect Patient presenting with septic shock as above with associated cholecystitis with likely underlying cardiogenic  shock requiring vasopressors and ventilatory support, with subsequent PEA arrest in the setting of percutaneous placement of gallbladder drain.  Extubated on 03/17/2020 and weaned off vasopressors.  TTE with severe biventricular dysfunction with LVEF 20% with severely dilated RA, moderately dilated LA. Repeat limited echocardiogram 03/26/2020 shows improvement of LVEF to 55-60%, with grade 2 diastolic dysfunction. --Cardiology signed off 11/16 --net negative 477mL past 24hrs and net negative 5.7L since admission --continue isosorbide mononitrate 30 mg p.o. daily --Hydralazine 37.5 mg q8h --restarted furosemide 40mg  PO daily --avoiding ACEi/ARB 2/2 AKI, and BB 2/2 bradycardia --Cardiology has singed off and we will start pt on low dose acei, BP below.  Blood pressure (!) 127/55, pulse 68, temperature 97.6 F (36.4 C), temperature source Oral, resp. rate 18, height 5\' 9"  (1.753 m), weight 98.4 kg, SpO2 98 %. -- low dose lisinopril at 2.5 mg po daily starting form today in evening. --pleural effusion on imaging,   Intake/Output Summary (Last 24 hours) at 04/03/2020 1602 Last data filed at 04/03/2020 1300 Gross per 24 hour  Intake 453 ml  Output 825 ml  Net -372 ml  cont hydralazine and Imdur.   Macrocytic Anemia/iron deficiency:  UGIB 2/2 stress gastritis --Iron 42, TIBC 253, Ferritin 118, foalte 16.6, B12 888, FOBT +  --Hgb 13.9>>>10.5>9.3>9.4>7.8>6.4>7.6>9.2>9.3>8.8>8.6>9.1>8.2>8.3>8.5>7.8>9.2 --s/p IV Feraheme 03/22/2020; continue home ferrous sulfate 325mg  PO daily --Protonix 40 mg PO daily --Carafate 1 g p.o. TIDAC/HS; continue  until discharge per GI --famotidine 20mg  PO BID --transfused 2u pRBC 11/11; 1u pRBC 11/12, 1u pRBC 11/17 --[facility] consulted, EGD 11/15 with no active bleeding, erythematous mucosa gastric fundus consistent with resolving stress gastritis. --CBC daily; transfuse to maintain hemoglobin greater than 8 --restart coumadin ok per GI and cardiology;  recommendations w/o loading -- pt has been restarted on coumadin and he had a large melanotic BM today and if he continued to have melanotic or bloody stools he is to have LGI eva.  --h/h is stable. He will need close OP f/u upon discharge.    Acute renal failure on CKD stage IIIb: Presented with creatinine 3.46 with baseline roughly 1.6. etiology likely secondary to prerenal failure from dehydration, sepsis, and shock as above.  Now appears back to baseline. --Cr 3.45>>>1.25>1.21>1.44>1.34>1.18>1.28>1.32 --Avoid nephrotoxins, renally dose all medications --Closely monitor renal function daily. -- Lab Results  Component Value Date   CREATININE 1.23 04/03/2020   CREATININE 1.28 (H) 04/01/2020   CREATININE 1.32 (H) 03/31/2020   Creatinine continues to improve.   Chronic permanent atrial fibrillation Currently rate controlled.  On Coumadin for anticoagulation at home.  Was also on IV heparin for NSTEMI during hospitalization.  Hospital course remarkable for bradycardia cardiac episodes and amiodarone was discontinued.  Not on beta-blockers. --INR 1.4 today --restarted coumadin 11/16 per GI and cardiology recommendations w/o loading --monitor INR daily. --inr today is 1.6 --  h/h is stable coumadin continued per pharmacy consult.  --h/h stable no bleeding and cont coumadin per pharmacy consult.  Cough:  Most likely secondary to congestive heart failure. No signs of pneumonia on chest xray.Cough hasimproved./ Resolved but pt still on oxygen we will obtain ct chest without contrast. Ct chest not done still pending. Ct chest shows pleural effusion and vascular congestion, as bp is soft we will diuresis cautiously.   Type II NSTEMI/elevated troponin:  Supply demand ischemia from sepsis/septic shock. Cardiology followed with no plan for any intervention; now signed off. No change in meds currently.   Urinary retention:resolved. Continue intermittent straight cath if needed.  Started on flomax. Continue to closely monitor urinary output.  Intake/Output Summary (Last 24 hours) at 04/03/2020 1558 Last data filed at 04/03/2020 1300 Gross per 24 hour  Intake 453 ml  Output 825 ml  Net -372 ml     Coronary artery disease s/p CABG/PCI with stents:  No complaint of chest pain. Continue Imdur. Follow-up with cardiology as an outpatient. --restarted coumadin as above. --If pt continued to have melanotic stools and or hb is dropping then coumadin to be stopped.  Hypertension: Currently blood pressure stable. Continue hydralazine, Imdur, aspirin and statin Blood pressure (!) 127/55, pulse 68, temperature 97.6 F (36.4 C), temperature source Oral, resp. rate 18, height $RemoveBe'5\' 9"'cOXVzXZnS$  (1.753 m), weight 98.4 kg, SpO2 98 %. BP is stable no change in regimen.   Hyperlipidemia:  On Crestor Continue crestor.   Diabetes type 2:  Poorly controlled, hemoglobin A1c 12.1.  Diabetic coordinator was following. --Lantus 30 units [place] daily --Resistant NovoLog insulin sliding scale for coverage --CBGs qAC/HS --No change in regimen.  OSA:On CPAP at night. Encouraged him to cpap  tonight but the mask we have here is what he is intolerant to.    Constipation: Last bowel movement 3 days ago, complains of constipation and mild abdominal discomfort. Refused bisacodyl supp yesterday. --Colace 100 mg p.o. twice daily --Miralax BID --continue to closely monitor BM -- resolved.   Obesity: BMI of 33.  Counseled on need for weight loss/lifestyle changes this complicates all facets of care. counseling when pt's acute issues are stable.  Debility/deconditioning/memory deficits:  PT/OT consulted skilled nursing facility on discharge. Patient lives with his daughter.On Seroquel at home. Daughter Butch Penny wants to take him home with home health. Anticipate d/c in am  Stage II pressure injury, buttocks; POA- No change.  Pressure Injury 03/14/20 Buttocks Right Stage 2 -  Partial  thickness loss of dermis presenting as a shallow open injury with a red, pink wound bed without slough. (Active)  03/14/20 2302  Location: Buttocks  Location Orientation: Right  Staging: Stage 2 -  Partial thickness loss of dermis presenting as a shallow open injury with a red, pink wound bed without slough.  Wound Description (Comments):   Present on Admission: Yes     Pressure Injury 03/14/20 Buttocks Left Stage 2 -  Partial thickness loss of dermis presenting as a shallow open injury with a red, pink wound bed without slough. (Active)  03/14/20 2300  Location: Buttocks  Location Orientation: Left  Staging: Stage 2 -  Partial thickness loss of dermis presenting as a shallow open injury with a red, pink wound bed without slough.  Wound Description (Comments):   Present on Admission: Yes     Pressure Injury 03/28/20 Ear Right Stage 2 -  Partial thickness loss of dermis presenting as a shallow open injury with a red, pink wound bed without  slough. (Active)  03/28/20 1100  Location: Ear  Location Orientation: Right  Staging: Stage 2 -  Partial thickness loss of dermis presenting as a shallow open injury with a red, pink wound bed without slough.  Wound Description (Comments):   Present on Admission: No  Wound care consulted and were following.  Continue local wound care, offloading.       DVT prophylaxis:  SCD'd  Code Status:  Full.  Family Communication:  Education officer, museum at [employer].  Disposition Plan:  Status is: Inpatient Remains inpatient appropriate because:Unsafe d/c plan, IV treatments appropriate due to intensity of illness or inability to take PO and Inpatient level of care appropriate due to severity of illness  Dispo: The patient is from: Home  Anticipated d/c is to: Home  Anticipated d/c date is: 1 day  Patient currently is not medically stable to d/c. Altered this morning due to hypercarbic respiratory failure from  noncompliance with nocturnal CPAP, now placed on BiPAP.   Consultants:   PCCM  Interventional radiology  General surgery  Cardiology - signed off 11/16  [facility] gastroenterology, Dr. Paulita Fujita, Dr. Cristina Gong  Procedures:   IR placement cholecystostomy on 03/17/2020  EGD 11/15  BiPAP 11/18  Subjective: Pt today is on oxygen and is alert but confused. Plan is for d/c with home health.  He is still on oxygen and not on oxygen at baseline. D/w daughter that I will review chart and consider ct chest whithout out contrast. D?w her that we will probably d/c tomorrow.  Per gi and cardiology note pt can restart coumadin.Per abg pt is still hypercarbic.  Cont with cpap overnight and d/c in am.h/h is stable at 8.8. We will obtain ct chest and follow and d/c in am  if pt is stable .   Lab Results  Component Value Date   CREATININE 1.23 04/03/2020   CREATININE 1.28 (H) 04/01/2020   CREATININE 1.32 (H) 03/31/2020  Creatinine is improving , and will avoid renally dose meds and contrast studies.   Pt is alert today and is improving although still is requiring oxygen and ct chest showed moderate Bl pleural effusion. Pt is already on po lasix and we will give additional iv doses for today.spoek to daughter and son about his ct results and plan for further diuresis and d/c tomorrow.   Objective: Vitals:   04/02/20 2347 04/03/20 0300 04/03/20 0631 04/03/20 0715  BP:  (!) 146/61 (!) 147/66 (!) 127/55  Pulse:  67  68  Resp:  19  18  Temp:  98.3 F (36.8 C)  97.6 F (36.4 C)  TempSrc:  Oral  Oral  SpO2: 97% 98%  98%  Weight:      Height:       SpO2: 98 % O2 Flow Rate (L/min): 4 L/min FiO2 (%): 40 %  Intake/Output Summary (Last 24 hours) at 04/03/2020 1558 Last data filed at 04/03/2020 1300 Gross per 24 hour  Intake 453 ml  Output 825 ml  Net -372 ml   Filed Weights   03/18/20 0400 03/21/20 1217 03/24/20 0500  Weight: 98.5 kg 102.2 kg 98.4 kg    Examination: Blood pressure  (!) 127/55, pulse 68, temperature 97.6 F (36.4 C), temperature source Oral, resp. rate 18, height $RemoveBe'5\' 9"'waapotZyt$  (1.753 m), weight 98.4 kg, SpO2 98 %. Physical Exam Constitutional:      General: He is awake.     Appearance: Normal appearance. He is obese.     Interventions: Nasal cannula in place.  HENT:  Head: Normocephalic.     Right Ear: Hearing and external ear normal.     Left Ear: Hearing and external ear normal.     Nose: Nose normal.     Mouth/Throat:     Lips: Pink.     Mouth: Mucous membranes are dry.     Tongue: No lesions. Tongue does not deviate from midline.     Palate: No mass and lesions.     Pharynx: Oropharynx is clear.  Eyes:     General: Lids are normal.     Extraocular Movements: Extraocular movements intact.     Conjunctiva/sclera: Conjunctivae normal.  Neck:     Vascular: No carotid bruit.  Cardiovascular:     Rate and Rhythm: Normal rate. Rhythm irregular.     Pulses: Normal pulses.  Pulmonary:     Effort: Pulmonary effort is normal. No tachypnea, bradypnea, accessory muscle usage, prolonged expiration, respiratory distress or retractions.     Breath sounds: Normal breath sounds and air entry. No decreased air movement.  Abdominal:     General: Bowel sounds are normal. There is no distension or abdominal bruit. There are no signs of injury.     Palpations: Abdomen is soft.  Musculoskeletal:     Cervical back: Full passive range of motion without pain.     Right lower leg: No edema.  Neurological:     Mental Status: He is alert and oriented to person, place, and time.     Cranial Nerves: Cranial nerves are intact.  Psychiatric:        Behavior: Behavior is cooperative.     Data Reviewed:  I/O last 3 completed shifts: In: 706 [P.O.:680; I.V.:26] Out: 1660 [Urine:1650; Drains:10] Total I/O In: 440 [P.O.:440] Out: 325 [Urine:325] Lab Results  Component Value Date   CREATININE 1.23 04/03/2020   CREATININE 1.28 (H) 04/01/2020   CREATININE 1.32 (H)  03/31/2020   CBC: Recent Labs  Lab 03/31/20 0033 04/01/20 0015 04/01/20 1739 04/02/20 0454 04/03/20 0746  WBC 9.7 9.0 7.8 6.5 7.1  HGB 9.2* 8.8* 8.7* 8.3* 9.0*  HCT 30.1* 28.9* 28.7* 27.3* 29.9*  MCV 105.2* 107.0* 105.9* 106.2* 108.3*  PLT 254 270 272 226 409   Basic Metabolic Panel: Recent Labs  Lab 03/29/20 0116 03/29/20 0811 03/29/20 1701 03/30/20 0059 03/31/20 0033 04/01/20 0015 04/03/20 0746  NA 134*  --   --  138 139 141 140  K 6.2*   < > 5.4* 5.3* 5.1 4.9 4.4  CL 98  --   --  102 100 102 102  CO2 29  --   --  $R'30 31 30 30  'FT$ GLUCOSE 160*  --   --  209* 192* 91 111*  BUN 38*  --   --  40* 43* 45* 42*  CREATININE 1.28*  --   --  1.32* 1.32* 1.28* 1.23  CALCIUM 8.7*  --   --  8.9 8.9 8.6* 8.9  MG  --   --   --   --   --   --  2.3  PHOS  --   --   --   --   --   --  3.9   < > = values in this interval not displayed.   GFR: Estimated Creatinine Clearance: 56.3 mL/min (by C-G formula based on SCr of 1.23 mg/dL). Liver Function Tests: Recent Labs  Lab 04/03/20 0746  AST 20  ALT 14  ALKPHOS 131*  BILITOT 0.3  PROT 6.9  ALBUMIN 2.6*  No results for input(s): LIPASE, AMYLASE in the last 168 hours. No results for input(s): AMMONIA in the last 168 hours.  Coagulation Profile: Recent Labs  Lab 03/31/20 0033 04/01/20 0015 04/02/20 0454 04/02/20 1501 04/03/20 0054  INR 1.4* 1.6* 1.8* 1.7* 1.7*   Cardiac Enzymes: No results for input(s): CKTOTAL, CKMB, CKMBINDEX, TROPONINI in the last 168 hours. BNP (last 3 results) No results for input(s): PROBNP in the last 8760 hours. HbA1C: No results for input(s): HGBA1C in the last 72 hours. CBG: Recent Labs  Lab 04/02/20 1953 04/03/20 0017 04/03/20 0400 04/03/20 0801 04/03/20 1219  GLUCAP 159* 154* 162* 119* 156*   Lipid Profile: No results for input(s): CHOL, HDL, LDLCALC, TRIG, CHOLHDL, LDLDIRECT in the last 72 hours. Thyroid Function Tests: No results for input(s): TSH, T4TOTAL, FREET4, T3FREE,  THYROIDAB in the last 72 hours. Anemia Panel: No results for input(s): VITAMINB12, FOLATE, FERRITIN, TIBC, IRON, RETICCTPCT in the last 72 hours. Sepsis Labs: No results for input(s): PROCALCITON, LATICACIDVEN in the last 168 hours.  No results found for this or any previous visit (from the past 240 hour(s)).    Radiology Studies: CT CHEST WO CONTRAST  Result Date: 04/02/2020 CLINICAL DATA:  Acute hypercarbic respiratory failure EXAM: CT CHEST WITHOUT CONTRAST TECHNIQUE: Multidetector CT imaging of the chest was performed following the standard protocol without IV contrast. COMPARISON:  CT thoracic spine 12/12/2018, radiograph 03/31/2020, CT abdomen pelvis 03/14/2020 FINDINGS: Cardiovascular: Postsurgical changes from prior sternotomy and CABG. Cardiomegaly. Three-vessel coronary artery atherosclerosis. Calcifications of the mitral annulus, aortic leaflets and chordae tendinae. Hypoattenuation of the cardiac blood pool, likely anemia. Atherosclerotic plaque within the normal caliber aorta. No hyperdense mural thickening or plaque displacement. No focal periaortic stranding or hemorrhage. Normal 3 vessel branching of the aortic arch. Proximal great vessels are heavily calcified. Central pulmonary arteries are borderline enlarged which may reflect some pulmonary artery hypertension. Luminal evaluation of the vasculature precluded in the absence of contrast media. Mediastinum/Nodes: Anterior mediastinal stranding likely from prior sternotomy and CABG. No mediastinal fluid or gas. Normal thyroid gland and thoracic inlet. No acute abnormality of the trachea or esophagus. Scattered low-attenuation mediastinal nodes, possibly edematous or reactive. No axillary adenopathy. Hilar nodal evaluation is limited in the absence of intravenous contrast media. Lungs/Pleura: Neck moderate bilateral pleural effusions with fluid tracking in the fissures. Adjacent dense atelectatic change. Underlying infection is not fully  excluded. More diffuse hazy interstitial opacities with fissural and septal thickening and vascular redistribution likely reflecting interstitial and alveolar edema. No pneumothorax. Posterior bowing of the trachea reflecting imaging during exhalation. Upper Abdomen: Lobular liver surface contour, nonspecific though could correlate for findings of intrinsic liver disease/cirrhosis. Upper abdominal atherosclerosis. No acute abnormalities present in the visualized portions of the upper abdomen. Musculoskeletal: Diffuse body wall edema. Bilateral gynecomastia. The osseous structures appear diffusely demineralized which may limit detection of small or nondisplaced fractures. Degenerative changes are present in the imaged spine and shoulders. Prior sternotomy. Prior thoracolumbar fusion extending from T7 to levels below the margin of imaging with bilateral transpedicular screws and posterior fixation rods as well as fusion of the posterior elements. No acute complication is evident. Remote posttraumatic deformity of the T9-T10 levels. Background of multilevel bridging syndesmophytes compatible with ankylosis. Multilevel degenerative changes as well. No acute or worrisome osseous abnormalities. IMPRESSION: 1. Moderate bilateral pleural effusions with fluid tracking in the fissures. Adjacent dense passive and dependent atelectatic changes. 2. Additional features of CHF/volume overload with cardiomegaly, interstitial and likely alveolar edema, and pulmonary vascular  congestion. Diffuse body wall edema present as well. 3. Lobular liver surface contour, nonspecific though could correlate for findings of intrinsic liver disease/cirrhosis. 4. Prior thoracolumbar fusion extending from T7 to levels below the margin of imaging spanning a prior fracture of the T9-10 level on a background of ankylosis. No acute complication is evident. 5. Aortic Atherosclerosis (ICD10-I70.0). Electronically Signed   By: Lovena Le M.D.   On:  04/02/2020 23:36      Current Facility-Administered Medications (Endocrine & Metabolic):  .  insulin aspart (novoLOG) injection 0-20 Units .  insulin glargine (LANTUS) injection 30 Units   Current Facility-Administered Medications (Cardiovascular):  .  furosemide (LASIX) tablet 40 mg .  hydrALAZINE (APRESOLINE) injection 5 mg .  hydrALAZINE (APRESOLINE) tablet 37.5 mg .  isosorbide mononitrate (IMDUR) 24 hr tablet 30 mg .  rosuvastatin (CRESTOR) tablet 10 mg   Current Facility-Administered Medications (Respiratory):  .  guaiFENesin-dextromethorphan (ROBITUSSIN DM) 100-10 MG/5ML syrup 5 mL   Current Facility-Administered Medications (Analgesics):  .  acetaminophen (TYLENOL) 160 MG/5ML solution 650 mg .  [DISCONTINUED] acetaminophen (TYLENOL) tablet 650 mg **OR** [DISCONTINUED] acetaminophen (TYLENOL) 160 MG/5ML solution 650 mg **OR** acetaminophen (TYLENOL) suppository 650 mg  .  allopurinol (ZYLOPRIM) tablet 100 mg .  traMADol (ULTRAM) tablet 50 mg   Current Facility-Administered Medications (Hematological):  .  ferrous sulfate tablet 325 mg   Current Facility-Administered Medications (Other):  .  0.9 %  sodium chloride infusion (Manually program via Guardrails IV Fluids) .  0.9 %  sodium chloride infusion .  alum & mag hydroxide-simeth (MAALOX/MYLANTA) 200-200-20 MG/5ML suspension 15 mL .  bisacodyl (DULCOLAX) suppository 10 mg .  chlorhexidine gluconate (MEDLINE KIT) (PERIDEX) 0.12 % solution 15 mL .  docusate (COLACE) 50 MG/5ML liquid 100 mg .  famotidine (PEPCID) tablet 20 mg .  feeding supplement (ENSURE ENLIVE / ENSURE PLUS) liquid 237 mL .  gabapentin (NEURONTIN) 250 MG/5ML solution 300 mg .  Gerhardt's butt cream .  loperamide (IMODIUM) capsule 2 mg .  MEDLINE mouth rinse .  ondansetron (ZOFRAN) tablet 4 mg **OR** ondansetron (ZOFRAN) injection 4 mg  .  pantoprazole (PROTONIX) injection 40 mg .  polyethylene glycol (MIRALAX / GLYCOLAX) packet 17 g .   QUEtiapine (SEROQUEL) tablet 50 mg .  sodium chloride flush (NS) 0.9 % injection 3 mL .  sodium chloride flush (NS) 0.9 % injection 5 mL .  sucralfate (CARAFATE) 1 GM/10ML suspension 1 g .  tamsulosin (FLOMAX) capsule 0.4 mg .  Warfarin - Pharmacist Dosing Inpatient  Current Outpatient Medications (Other):  .  sodium chloride 0.9 % injection, Instill 5 mL daily into percutaneous drain.  Anti-infectives (From admission, onward)   Start     Dose/Rate Route Frequency Ordered Stop   03/21/20 1000  amoxicillin-clavulanate (AUGMENTIN) 875-125 MG per tablet 1 tablet        1 tablet Oral Every 12 hours 03/21/20 0719 03/28/20 2128   03/18/20 2200  amoxicillin-clavulanate (AUGMENTIN) 500-125 MG per tablet 500 mg  Status:  Discontinued        1 tablet Oral Every 12 hours 03/18/20 1459 03/18/20 1502   03/18/20 2200  amoxicillin-clavulanate (AUGMENTIN) 250-62.5 MG/5ML suspension 500 mg        500 mg Oral Every 12 hours 03/18/20 1502 03/20/20 2108   03/15/20 2200  piperacillin-tazobactam (ZOSYN) IVPB 3.375 g        3.375 g 12.5 mL/hr over 240 Minutes Intravenous Every 8 hours 03/15/20 1755 03/18/20 1852   03/15/20 1000  ceFEPIme (MAXIPIME) 2  g in sodium chloride 0.9 % 100 mL IVPB  Status:  Discontinued        2 g 200 mL/hr over 30 Minutes Intravenous Every 24 hours 03/14/20 1020 03/15/20 0843   03/15/20 0930  cefTRIAXone (ROCEPHIN) 2 g in sodium chloride 0.9 % 100 mL IVPB  Status:  Discontinued        2 g 200 mL/hr over 30 Minutes Intravenous Every 24 hours 03/15/20 0844 03/15/20 1755   03/14/20 1600  metroNIDAZOLE (FLAGYL) IVPB 500 mg  Status:  Discontinued        500 mg 100 mL/hr over 60 Minutes Intravenous Every 8 hours 03/14/20 1427 03/15/20 1755   03/14/20 0800  ceFEPIme (MAXIPIME) 2 g in sodium chloride 0.9 % 100 mL IVPB        2 g 200 mL/hr over 30 Minutes Intravenous  Once 03/14/20 0752 03/14/20 0845   03/14/20 0800  metroNIDAZOLE (FLAGYL) IVPB 500 mg        500 mg 100 mL/hr over 60  Minutes Intravenous  Once 03/14/20 0752 03/14/20 0935      Scheduled Meds: . sodium chloride   Intravenous Once  . acetaminophen (TYLENOL) oral liquid 160 mg/5 mL  650 mg Oral Q4H  . acetaminophen  650 mg Rectal Q4H  . allopurinol  100 mg Oral BID  . bisacodyl  10 mg Rectal Once  . chlorhexidine gluconate (MEDLINE KIT)  15 mL Mouth Rinse BID  . docusate  100 mg Oral BID  . famotidine  20 mg Oral BID  . feeding supplement  237 mL Oral TID BM  . ferrous sulfate  325 mg Oral Q breakfast  . furosemide  40 mg Oral q AM  . gabapentin  300 mg Oral Q12H  . hydrALAZINE  37.5 mg Oral Q8H  . insulin aspart  0-20 Units Subcutaneous Q4H  . insulin glargine  30 Units Subcutaneous Daily  . isosorbide mononitrate  30 mg Oral Daily  . mouth rinse  15 mL Mouth Rinse BID  . pantoprazole  40 mg Oral Daily  . polyethylene glycol  17 g Oral BID  . QUEtiapine  50 mg Oral Q supper  . rosuvastatin  10 mg Oral QHS  . sodium chloride flush  3 mL Intravenous Q12H  . sodium chloride flush  5 mL Intracatheter Q8H  . sucralfate  1 g Oral TID WC & HS  . tamsulosin  0.4 mg Oral QPC supper  . warfarin  5 mg Oral ONCE-1600  . Warfarin - Pharmacist Dosing Inpatient   Does not apply q1600   Continuous Infusions: . sodium chloride Stopped (03/17/20 2016)     LOS: 20 days  TIME: 60 minutes.   Para Skeans, MD [facility] Pager [phone] How to contact the [facility] Attending or Consulting provider [place] or covering provider during after hours [place], for this patient?    1. Check the care team in [facility] and look for a) attending/consulting [facility] provider listed and b) the [facility] team listed 2. Log into [email or web address] and use [place]'s universal password to access. If you do not have the password, please contact the hospital operator. 3. Locate the [facility] provider you are looking for under [facility] and page to a number that you can be directly reached. 4. If you still have difficulty reaching the  provider, please page the [facility] (Director on Call) for the Hospitalists listed on amion for assistance. [email or web address] Password [email or web address] 04/03/2020, 3:58 PM

## 2020-04-04 DIAGNOSIS — A419 Sepsis, unspecified organism: Secondary | ICD-10-CM | POA: Diagnosis not present

## 2020-04-04 LAB — GLUCOSE, CAPILLARY
Glucose-Capillary: 121 mg/dL — ABNORMAL HIGH (ref 70–99)
Glucose-Capillary: 128 mg/dL — ABNORMAL HIGH (ref 70–99)
Glucose-Capillary: 139 mg/dL — ABNORMAL HIGH (ref 70–99)
Glucose-Capillary: 150 mg/dL — ABNORMAL HIGH (ref 70–99)
Glucose-Capillary: 161 mg/dL — ABNORMAL HIGH (ref 70–99)
Glucose-Capillary: 179 mg/dL — ABNORMAL HIGH (ref 70–99)
Glucose-Capillary: 65 mg/dL — ABNORMAL LOW (ref 70–99)

## 2020-04-04 LAB — BASIC METABOLIC PANEL
Anion gap: 8 (ref 5–15)
BUN: 39 mg/dL — ABNORMAL HIGH (ref 8–23)
CO2: 29 mmol/L (ref 22–32)
Calcium: 8.6 mg/dL — ABNORMAL LOW (ref 8.9–10.3)
Chloride: 98 mmol/L (ref 98–111)
Creatinine, Ser: 1.22 mg/dL (ref 0.61–1.24)
GFR, Estimated: >60 mL/min (ref >60)
Glucose, Bld: 166 mg/dL — ABNORMAL HIGH (ref 70–99)
Potassium: 4.7 mmol/L (ref 3.5–5.1)
Sodium: 135 mmol/L (ref 135–145)

## 2020-04-04 LAB — CBC
HCT: 29.4 % — ABNORMAL LOW (ref 39.0–52.0)
Hemoglobin: 8.8 g/dL — ABNORMAL LOW (ref 13.0–17.0)
MCH: 32.7 pg (ref 26.0–34.0)
MCHC: 29.9 g/dL — ABNORMAL LOW (ref 30.0–36.0)
MCV: 109.3 fL — ABNORMAL HIGH (ref 80.0–100.0)
Platelets: 252 10*3/uL (ref 150–400)
RBC: 2.69 MIL/uL — ABNORMAL LOW (ref 4.22–5.81)
RDW: 17.8 % — ABNORMAL HIGH (ref 11.5–15.5)
WBC: 8.2 10*3/uL (ref 4.0–10.5)
nRBC: 0 % (ref 0.0–0.2)

## 2020-04-04 LAB — PROTIME-INR
INR: 1.8 — ABNORMAL HIGH (ref 0.8–1.2)
Prothrombin Time: 20.5 seconds — ABNORMAL HIGH (ref 11.4–15.2)

## 2020-04-04 MED ORDER — INSULIN ASPART 100 UNIT/ML ~~LOC~~ SOLN: 0.0000 [IU] | Freq: Every day | SUBCUTANEOUS | Status: DC

## 2020-04-04 MED ORDER — INSULIN ASPART 100 UNIT/ML ~~LOC~~ SOLN
0.0000 [IU] | Freq: Three times a day (TID) | SUBCUTANEOUS | Status: DC
  Administered 2020-04-04: 3 [IU] via SUBCUTANEOUS

## 2020-04-04 MED ORDER — FUROSEMIDE 10 MG/ML IJ SOLN
40.0000 mg | Freq: Two times a day (BID) | INTRAMUSCULAR | Status: AC
  Administered 2020-04-04 (×2): 40 mg via INTRAVENOUS
  Filled 2020-04-04 (×2): qty 4

## 2020-04-04 MED ORDER — WARFARIN SODIUM 3 MG PO TABS
6.0000 mg | ORAL_TABLET | Freq: Once | ORAL | Status: AC
  Administered 2020-04-04: 6 mg via ORAL
  Filled 2020-04-04: qty 2

## 2020-04-04 MED ORDER — INSULIN ASPART 100 UNIT/ML ~~LOC~~ SOLN
0.0000 [IU] | Freq: Three times a day (TID) | SUBCUTANEOUS | Status: DC
  Administered 2020-04-05: 3 [IU] via SUBCUTANEOUS

## 2020-04-04 NOTE — Progress Notes (Signed)
Patients BG 65. Patient given orange juice and crackers at this time.

## 2020-04-04 NOTE — Progress Notes (Addendum)
ANTICOAGULATION CONSULT NOTE - Follow-Up  Pharmacy Consult for warfarin Indication: atrial fibrillation  No Known Allergies  Patient Measurements: Height: 5\' 9"  (175.3 cm) Weight: 98.4 kg (216 lb 14.9 oz) IBW/kg (Calculated) : 70.7 Heparin Dosing Weight: 90.1 kg  Vital Signs: Temp: 98.7 F (37.1 C) (11/22 1141) Temp Source: Oral (11/22 1141) BP: 154/56 (11/22 1141) Pulse Rate: 72 (11/22 1141)  Labs: Recent Labs    04/01/20 1739 04/01/20 1739 04/02/20 0454 04/02/20 0454 04/02/20 1501 04/03/20 0054 04/03/20 0746 04/04/20 0113  HGB 8.7*   < > 8.3*  --   --   --  9.0*  --   HCT 28.7*  --  27.3*  --   --   --  29.9*  --   PLT 272  --  226  --   --   --  235  --   LABPROT  --   --  19.8*   < > 19.4* 19.2*  --  20.5*  INR  --   --  1.8*   < > 1.7* 1.7*  --  1.8*  CREATININE  --   --   --   --   --   --  1.23  --    < > = values in this interval not displayed.    Estimated Creatinine Clearance: 56.3 mL/min (by C-G formula based on SCr of 1.23 mg/dL).   Medical History: Past Medical History:  Diagnosis Date  . Atrial fibrillation (HCC)    on Coumadin  . CAD (coronary artery disease)    s/p remote CABG, stent in 2019  . CHF (congestive heart failure) (HCC)   . Dementia (HCC)   . Diabetes mellitus   . DVT (deep venous thrombosis) (HCC)   . Fall at home 10/2015  . Hyperlipidemia   . Hypertension   . Left-sided carotid artery disease (HCC)   . MRSA infection   . Myocardial infarction (HCC)   . Obesity   . Peripheral vascular disease (HCC)   . Sleep apnea   . Stroke (HCC)   . Stroke (HCC)   . Venous insufficiency      Assessment: 48 you male with a history of PVD, CAD, HTN, HLD, DM and afib on warfarin presents with an NSTEMI. Pharmacy was consulted to dose heparin. PTA the patient was on warfarin at 5 mg daily with an INR goal of 2-3. It is unknown whether or not the patient was therapeutic while on this dose.  Endoscopy 11/15 showed no active bleeding,  possible gastritis. GI added Carafate and patient cleared for anticoagulation.   INR is subtherapeutic at 1.8 (slowly trending up). Hgb 9, plt 235 on last check 11/21. No s/sx of bleeding. Eating 90-100% of documented meals.   Goal of Therapy:  INR 2-3 Monitor platelets by anticoagulation protocol: Yes   Plan:  Warfarin 6 mg PO x1 tonight  Daily INR  12/21, PharmD, BCCCP Clinical Pharmacist  Phone: [phone] 04/04/2020 12:25 PM  Please check AMION for all [facility] Pharmacy phone numbers After 10:00 PM, call Main Pharmacy [phone]

## 2020-04-04 NOTE — Progress Notes (Signed)
Nutrition Follow-up  DOCUMENTATION CODES:   Not applicable  INTERVENTION:   - Please obtain updated weight as last weight is from 03/24/20  - Continue feeding assistance with meals to maximize PO intake  -Continue Ensure Enlive po TID, each supplement provides 350 kcal and 20 grams of protein  - Continue strawberry yogurt TID with meal trays  - Downgrade diet to dysphagia 3 for ease of chewing  NUTRITION DIAGNOSIS:   Increased nutrient needs related to wound healing as evidenced by estimated needs.  Ongoing  GOAL:   Patient will meet greater than or equal to 90% of their needs  Progressing  MONITOR:   PO intake, Supplement acceptance, Labs, Weight trends, Skin, I & O's  REASON FOR ASSESSMENT:   Ventilator    ASSESSMENT:   79 year old male who presented to the ED on 11/10 with generalized weakness. PMH of CAD, CHF, atrial fibrillation, DVT, HTN, HLD, PVD, CVA, thoracic back surgery last year, pressure injury. Pt admitted with sepsis due to acute cholecystitis.  11/02 - brief cardiac arrest in IR, intubated 11/03 - s/p percutaneous cholecystostomy tube placement 11/04 - extubated, diet advanced 11/15 - EGD showing no active bleeding, resolving stress gastritis  Spoke with pt at bedside. Pt with 100% completed Ensure Plus at bedside. He reports that he is drinking 1-2 supplements daily. Pt reports that overall his appetite is improve and he is eating better.  Pt reports that he had some issues with his top teeth/dentures and that this has limited his PO intake. Pt amenable to downgrading diet to dysphagia 3 for ease of chewing and ease of intake. Orders placed.  Discussed pt with RN. RN confirms that pt is drinking Ensure supplements and reports that his PO intake is fair. Per RN, pt has been complaining of pain in his stomach.  At end of visit with pt, pt reports that he had a BM and requests nursing assistance. RN aware.  Admit weight: 94.2 kg Current weight  (as of 11/11): 98.4 kg  Meal Completion: 80-100% x last 8 meals  Medications reviewed and include: colace, pepcid, Ensure Enlive TID, ferrous sulfate, lasix, SSI, lantus 30 units daily, IV protonix, miralax, sucralfate, warfarin  Labs reviewed: hemoglobin 8.8 CBG's: 65-179 x 24 hours  UOP: 1225 ml x 24 hours I/O's: -6.8 L since admit  Diet Order:   Diet Order            DIET DYS 3 Room service appropriate? Yes; Fluid consistency: Thin  Diet effective now                 EDUCATION NEEDS:   Not appropriate for education at this time  Skin:  Skin Assessment: Skin Integrity Issues: Stage II: bilateral buttocks, right ear  Last BM:  04/04/20 large type 6  Height:   Ht Readings from Last 1 Encounters:  03/21/20 5\' 9"  (1.753 m)    Weight:   Wt Readings from Last 1 Encounters:  03/24/20 98.4 kg    Ideal Body Weight:  72.7 kg  BMI:  Body mass index is 32.04 kg/m.  Estimated Nutritional Needs:   Kcal:  2100-2300  Protein:  110-130 grams  Fluid:  >/= 2.0 L    13/11/21, MS, RD, LDN Inpatient Clinical Dietitian Please see AMiON for contact information.

## 2020-04-04 NOTE — Progress Notes (Signed)
[facility] Gastroenterology Progress Note  Isaiah [name] 79 y.o. [birth year]  CC:  Melena, anemia  Subjective: Patient had a black bowel movement today per flowsheet.  Denies any abdominal pain, nausea, vomiting.  Patient on 6L O2, desatted to O2 saturations in the 70s when he attempted to reposition himself in bed.  O2 saturations improved to 80s and low 90s after he rested.  ROS : Review of Systems  Cardiovascular: Negative for chest pain and palpitations.  Gastrointestinal: Positive for melena. Negative for abdominal pain, blood in stool, constipation, diarrhea, heartburn, nausea and vomiting.   Objective: Vital signs in last 24 hours: Vitals:   04/04/20 0600 04/04/20 0800  BP: (!) 128/51   Pulse: 70   Resp: (!) 23   Temp:  98.7 F (37.1 C)  SpO2: 94%     Physical Exam:  General:  Lethargic, obese, cooperative, no distress, nasal cannula in place  Head:  Normocephalic, without obvious abnormality, atraumatic  Eyes:  Anicteric sclera, EOMs intact   Lungs:   Clear to auscultation bilaterally, mildly tachypneic  Heart:  Regular rate and rhythm, S1, S2 normal  Abdomen:   Soft, non-tender, bowel sounds active all four quadrants,  no masses,   Extremities: Extremities normal, atraumatic, no  edema  Pulses: 2+ and symmetric    Lab Results: Recent Labs    04/03/20 0746  NA 140  K 4.4  CL 102  CO2 30  GLUCOSE 111*  BUN 42*  CREATININE 1.23  CALCIUM 8.9  MG 2.3  PHOS 3.9   Recent Labs    04/03/20 0746  AST 20  ALT 14  ALKPHOS 131*  BILITOT 0.3  PROT 6.9  ALBUMIN 2.6*   Recent Labs    04/02/20 0454 04/03/20 0746  WBC 6.5 7.1  HGB 8.3* 9.0*  HCT 27.3* 29.9*  MCV 106.2* 108.3*  PLT 226 235   Recent Labs    04/03/20 0054 04/04/20 0113  LABPROT 19.2* 20.5*  INR 1.7* 1.8*      Assessment: Melena, anemia.    EGD 03/28/20: Erythematous mucosa in the gastric fundus, consistent with resolving stress gastritis. THIS, COUPLED WITH ANTICOAGULATION, WAS THE  PATIENT'S LIKELY CAUSE OF RECENT BLEEDING. -Hgb 9.0 yesterday, stable. Today's Hgb pending  -INR 1.8 -BUN 42/Cr 1.23 as of 11/21, stable  Sepsis, cholecystitis, cholecystostomy tube in place  Acute on chronic heart failure, cardiogenic shock.  Last EF 20% as of 11/3  A fib, on Coumadin  Acute hypercarbic respiratory failure  Plan: Continue Protonix 40 mg BID. Protonix twice daily x2 months, followed by once daily dosing indefinitely.  We can consider colonoscopy this admission if OK from a cardiac standpoint, though fortunately Hgb is stable at this time.  Continue supportive care.  Continue to monitor H&H with transfusion as needed to maintain hemoglobin greater than 7.  Edrick Kins PA-C 04/04/2020, 9:50 AM  Contact #  [phone]

## 2020-04-04 NOTE — Progress Notes (Signed)
Patient resting in room in bed near end of shift. Patient A&O x4. Patient did use Cpap throughout night, currently on [place] at 3L, eating breakfast. Patient did have low BG of 65, was given ice cream, and orange juice, BG rechecked and was 128. U/O 300 during shift. Patient continues on lasix. No BM noted. No complains of pain or distress throughout shift. colectomy drain had no output during shift. Last hgb 9.0. Patient noted to have redness to sacrum, aquacel foam in place. Plan for this patient is possilbe d/c today. Safety measures remain in place. Will continue to monitor at this time and SBARR to day shift nurse.

## 2020-04-04 NOTE — Progress Notes (Signed)
PROGRESS NOTE    Isaiah [name]  GMW:[id number] DOB: 19-May-1940 DOA: 03/14/2020  PCP: [name], [name], MD    Brief Narrative:  Chief Complaint: Weakness, pressure ulcer  HPI: Isaiah [name] is a 79 y.o. male with medical history significant of PVD; CAD; HTN; HLD; DM; afib on Coumadin; and CHF presenting with weakness and a buttock pressure ulcer.  Everything was normal Friday, it was a great day.  Saturday AM, he was complaining of gas and bloating.  He sat in his lift chair during the day.  That evening, he was somewhat confused.  He didn't eat that day.  Sunday, he slept all day.  She tried to get him up and he was too weak to stand, mumbling, confused.  He is having RUQ abdominal pain.  No fever at home, although he was flushed.  He has had a pressure ulcer since back surgery a year ago, has a scab over it and it pulled off.      ED Course:  Concern for sepsis, AKI, likely cholecystitis.  Abdominal pain Friday, delirium and weakness over the weekend.  Febrile to 100.6, hypoxic on arrival.  Significant RUQ TTP.  Labs with AKI, leukocytosis.  CT with concern for gallbladder pathology.  Surgery to evaluate.  ?volume overload so not given 30 cc/kg bolus.  New cirrhosis on CT, negative NH4.  Myoclonic jerking, likely encephalopathy plus Neurontin with AKI.  Overview: Pt admitted on 03/14/2020 for Abdominal pain, sepsis and ct in ed showed layering gallstones c/w acute cholecystitis.Pt was seen by general surgery same day and started on maxipime and flagyl, and due to poor medical condition pt was offered percutaneous cholecystostomy tube, after INR is subtherapeutic, for which pt was given vit k. Plan was for cardiac clearance then proceeding to Cholecystomy in 8 weeks.   03/15/2020 :Cardiac arrest. While down in IR on 03/15/2020 pt had a brief cardiac arrest . Patient in IR for procedure, elected to perform under general anesthesia due to severe OSA, renal failure.  After intubation and  before Perc tube placement, patient became hypotensive and bradycardic, lost pulses.  CPR initiated, completed 1 round and 1 round of epi and pulses resumed. Levophed started, IV fluids. Pt was then trasnferred to ICU.  On 03/17/2011 pt had  successful placement of per chole by interventional radiology . Pt had echo done on 03/16/2020: Echocardiogram (03/16/2020) 1. Severe biventricular dysfunction with at least mild-moderate secondary  MR and TR.  2. Left ventricular ejection fraction, by estimation, is 20%. The left  ventricle has severely decreased function. The left ventricle demonstrates  global hypokinesis. There is mild left ventricular hypertrophy. Left  ventricular diastolic parameters are  indeterminate. There is the interventricular septum is flattened in  systole and diastole, consistent with right ventricular pressure and  volume overload.  3. Right ventricular systolic function is severely reduced. The right  ventricular size is severely enlarged.  4. Left atrial size was mild to moderately dilated.  5. Right atrial size was severely dilated.  6. The mitral valve is normal in structure, and demonstrates tenting due  to LV dysfunction. Mild to moderate mitral valve regurgitation. No  evidence of mitral stenosis.  7. Tricuspid valve regurgitation is mild to moderate.  8. The aortic valve is grossly normal. There is mild calcification of the  aortic valve. Aortic valve regurgitation is trivial. No aortic stenosis is  present.   Left Heart Cath: (2018)  Ost Cx to Prox Cx lesion, 100 %stenosed. SVG to  OM patent, with Y graft SVG to diagonal originating from mid portion of the OM graft. Moderate disease in the origin of the Y graft portion to the diagonal.  Mid LAD lesion, 70 %stenosed. Dist LAD lesion, 100 %stenosed. LIMA to LAD is widely patent.  Ost RCA to Prox RCA lesion, 80 %stenosed. A STENT XIENCE ALPINE RX [id number] drug eluting stent was successfully placed.  Post  intervention, there is a 0% residual stenosis.  LV end diastolic pressure is moderately elevated.  There is no aortic valve stenosis. Pt was extubated on 03/17/2020 and was following commands. Pt's heart failure was stable and off pressors and GDMT not initiated due to AKI and Bradycardia. Pt's elevated troponin was attributed demand ischemia.ANd for his atrial fibrillation with bradycardia Anticoagulation was d.c due to anemia. AKI was improving. Pt was transferred to [facility] on the 6th. His heart failure was being managed with iv diuretics and decreased ef of 20% was attributed to post cardiac arrest. Pt remained in chronic a.fib and AC was held due to anemia and bleeding on cholecystostomy tube.Pt was also guaiac positive and therefore coumadin held. Pt was continued on iv abx for his septic shock from a/c cholecystitis.  Plan was for discharge on 03/22/2020 with IR drain and oxygen. Pt continued to get iv abx and PT OT was consulted and DME was delivered home for him. Gi was consulted for hie anemia  and guaiac positive  stool on 03/24/2020.  Pt was seen by cardiology dr.C and improved and became euvolemic and repeat echo as done and ef was improved to 55-60 % with grade ii diastolic dysfunction.  Pt s/p egd  11/15/2021which  Patchy mildly erythematous mucosa without bleeding was found in the  gastric fundus, resolving stress gastritis . Pt's creatinine also improved to 1.18 from 3.45. Per TOC note pt will need dme order.     Assessment & Plan:  Acute hypercarbic respiratory failure Notified by nursing this morning, patient lethargic with decreased mentation. Although was able to take some of his early morning medicines prior. Rapid response was initiated. Patient has been refusing nocturnal CPAP over the past 4 days. Patient was hemodynamically stable, SPO2 95% on 3 L nasal cannula. ABG with pH 7.25, PaCO2 75.7, PaO2 82.5. Etiology likely secondary to hypercarbic respiratory failure from CO2  retention related to noncompliance with nocturnal CPAP. --RT to place a BiPAP --Continue to closely monitor respiratory status --Repeat VBG at 1500 --Attempt to wean off BiPAP later this afternoon --Need to ensure he adheres to nocturnal CPAP --pt encouraged to use cpap but is intolerant to mask.  -- pt look so much more awake ands on nasal cannula, --SpO2: 96 % O2 Flow Rate (L/min): 5 L/min FiO2 (%): 40 % Pt is stable on 4L [place].Cont to use CPAP overnight.  Pt today was don and anxious, he is in acute congestive heart failure.I started him on lasix yesterday addition to po and he has not improved will now transition to iv lasix and dose him daily.    Acute cholecystitis Patient presenting to ED with right upper quadrant pain.  Imaging notable for distended gallbladder with layering gallstone and mild pericholestatic stranding.  Given severity of illness requiring ICU level of care and pressure support, patient underwent percutaneous cholecystostomy by IR on 03/16/2020. --WBC 21.2>>>12.7>10.6>10.3>10.2>10.0>9.7 --IV zosyn transitioned to Augmentin; completed 14 day course 11/15) --Continue cholecystostomy to gravity, flush 90m TID; 535mout past 24h --D/C home with PER Chole tube and Outpatient gen surg followup.  Acute on chronic combined systolic/diastolic congestive heart failure; decompensated with associated cardiogenic shock/PEA cardiac arrect Patient presenting with septic shock as above with associated cholecystitis with likely underlying cardiogenic shock requiring vasopressors and ventilatory support, with subsequent PEA arrest in the setting of percutaneous placement of gallbladder drain.  Extubated on 03/17/2020 and weaned off vasopressors.  TTE with severe biventricular dysfunction with LVEF 20% with severely dilated RA, moderately dilated LA. Repeat limited echocardiogram 03/26/2020 shows improvement of LVEF to 55-60%, with grade 2 diastolic dysfunction. --Cardiology signed off  11/16 --net negative 422m past 24hrs and net negative 5.7L since admission --continue isosorbide mononitrate 30 mg p.o. daily --Hydralazine 37.5 mg q8h --restarted furosemide 448mPO daily --avoiding ACEi/ARB 2/2 AKI, and BB 2/2 bradycardia --Cardiology has singed off and we will start pt on low dose acei, BP below.  Blood pressure (!) 158/71, pulse 77, temperature 97.7 F (36.5 C), temperature source Oral, resp. rate 18, height _0  (1.753 m), weight 98.4 kg, SpO2 96 %. -- low dose lisinopril at 2.5 mg po daily starting form today in evening. --pleural effusion on imaging,   Intake/Output Summary (Last 24 hours) at 04/04/2020 2130 Last data filed at 04/04/2020 1949 Gross per 24 hour  Intake 927 ml  Output 1051 ml  Net -124 ml  cont hydralazine and Imdur.   Blood pressure (!) 158/71, pulse 77, temperature 97.7 F (36.5 C), temperature source Oral, resp. rate 18, height _1  (1.753 m), weight 98.4 kg, SpO2 96 %.  Intake/Output Summary (Last 24 hours) at 04/04/2020 2135 Last data filed at 04/04/2020 1949 Gross per 24 hour  Intake 927 ml  Output 1051 ml  Net -124 ml  Pt started on lasix 40 mg iv bid. And we will continue monitor and cont with cpap at night    Macrocytic Anemia/iron deficiency:  UGIB 2/2 stress gastritis --Iron 42, TIBC 253, Ferritin 118, foalte 16.6, B12 888, FOBT +  --Hgb 13.9>>>10.5>9.3>9.4>7.8>6.4>7.6>9.2>9.3>8.8>8.6>9.1>8.2>8.3>8.5>7.8>9.2 --s/p IV Feraheme 03/22/2020; continue home ferrous sulfate 32518mO daily --Protonix 40 mg PO daily --Carafate 1 g p.o. TIDAC/HS; continue  until discharge per GI --famotidine 88m28m BID --transfused 2u pRBC 11/11; 1u pRBC 11/12, 1u pRBC 11/17 --[facility] consulted, EGD 11/15 with no active bleeding, erythematous mucosa gastric fundus consistent with resolving stress gastritis. --CBC daily; transfuse to maintain hemoglobin greater than 8 --restart coumadin ok per GI and cardiology; recommendations w/o loading -- pt  has been restarted on coumadin and he had a large melanotic BM today and if he continued to have melanotic or bloody stools he is to have LGI eva.  --h/h is stable. He will need close OP f/u upon discharge.  --h/h is stable 8.8/29.4  Acute renal failure on CKD stage IIIb: Presented with creatinine 3.46 with baseline roughly 1.6. etiology likely secondary to prerenal failure from dehydration, sepsis, and shock as above.  Now appears back to baseline. --Cr 3.45>>>1.25>1.21>1.44>1.34>1.18>1.28>1.32 --Avoid nephrotoxins, renally dose all medications --Closely monitor renal function daily. -- Lab Results  Component Value Date   CREATININE 1.22 04/04/2020   CREATININE 1.23 04/03/2020   CREATININE 1.28 (H) 04/01/2020   Creatinine continues to improve. creatinine is currently stable at 1.22   Chronic permanent atrial fibrillation Currently rate controlled.  On Coumadin for anticoagulation at home.  Was also on IV heparin for NSTEMI during hospitalization.  Hospital course remarkable for bradycardia cardiac episodes and amiodarone was discontinued.  Not on beta-blockers. --INR 1.4 today --restarted coumadin 11/16 per GI and cardiology recommendations w/o loading --monitor INR  daily. --inr today is 1.6 -- h/h is stable coumadin continued per pharmacy consult.  --h/h stable no bleeding and cont coumadin per pharmacy consult.  Cough:  Most likely secondary to congestive heart failure. No signs of pneumonia on chest xray.Cough hasimproved./ Resolved but pt still on oxygen we will obtain ct chest without contrast. Ct chest not done still pending. Ct chest shows pleural effusion and vascular congestion, as bp is soft we will diuresis cautiously.   Type II NSTEMI/elevated troponin:  Supply demand ischemia from sepsis/septic shock. Cardiology followed with conservative medical management.; now signed off. No change in meds currently.   Urinary retention:resolved. Continue intermittent  straight cath if needed. Started on flomax. Continue to closely monitor urinary output.  Intake/Output Summary (Last 24 hours) at 04/04/2020 2130 Last data filed at 04/04/2020 1949 Gross per 24 hour  Intake 927 ml  Output 1051 ml  Net -124 ml     Coronary artery disease s/p CABG/PCI with stents:  No complaint of chest pain. Continue Imdur. Follow-up with cardiology as an outpatient. --restarted coumadin as above. --If pt continued to have melanotic stools and or hb is dropping then coumadin to be stopped.  Hypertension: Currently blood pressure stable. Continue hydralazine, Imdur, aspirin and statin Blood pressure (!) 158/71, pulse 77, temperature 97.7 F (36.5 C), temperature source Oral, resp. rate 18, height _0  (1.753 m), weight 98.4 kg, SpO2 96 %. BP is stable no change in regimen.  Cont prn  Hydralazine cont room for diuresis.  Hyperlipidemia:  On Crestor Continue crestor.   Diabetes type 2:  Poorly controlled, hemoglobin A1c 12.1.  Diabetic coordinator was following. --Lantus 30 units [place] daily --Resistant NovoLog insulin sliding scale for coverage --CBGs qAC/HS --No change in regimen.  OSA:On CPAP at night. Encouraged him to cpap  tonight but the mask we have here is what he is intolerant to.    Constipation: Last bowel movement 3 days ago, complains of constipation and mild abdominal discomfort. Refused bisacodyl supp yesterday. --Colace 100 mg p.o. twice daily --Miralax BID --continue to closely monitor BM -- resolved.   Obesity: BMI of 33.  Counseled on need for weight loss/lifestyle changes this complicates all facets of care. counseling when pt's acute issues are stable.  Debility/deconditioning/memory deficits:  PT/OT consulted skilled nursing facility on discharge. Patient lives with his daughter.On Seroquel at home. Daughter Butch Penny wants to take him home with home health. Anticipate d/c in am  Stage II pressure injury, buttocks;  POA- No change.  Pressure Injury 03/14/20 Buttocks Right Stage 2 -  Partial thickness loss of dermis presenting as a shallow open injury with a red, pink wound bed without slough. (Active)  03/14/20 2302  Location: Buttocks  Location Orientation: Right  Staging: Stage 2 -  Partial thickness loss of dermis presenting as a shallow open injury with a red, pink wound bed without slough.  Wound Description (Comments):   Present on Admission: Yes     Pressure Injury 03/14/20 Buttocks Left Stage 2 -  Partial thickness loss of dermis presenting as a shallow open injury with a red, pink wound bed without slough. (Active)  03/14/20 2300  Location: Buttocks  Location Orientation: Left  Staging: Stage 2 -  Partial thickness loss of dermis presenting as a shallow open injury with a red, pink wound bed without slough.  Wound Description (Comments):   Present on Admission: Yes     Pressure Injury 03/28/20 Ear Right Stage 2 -  Partial thickness loss of dermis presenting  as a shallow open injury with a red, pink wound bed without slough. (Active)  03/28/20 1100  Location: Ear  Location Orientation: Right  Staging: Stage 2 -  Partial thickness loss of dermis presenting as a shallow open injury with a red, pink wound bed without slough.  Wound Description (Comments):   Present on Admission: No  Wound care consulted and were following.  Continue local wound care, offloading.       DVT prophylaxis:  SCD'd  Code Status:  Full.  Family Communication:  Education officer, museum at [employer].  Disposition Plan:  Status is: Inpatient Remains inpatient appropriate because:Unsafe d/c plan, IV treatments appropriate due to intensity of illness or inability to take PO and Inpatient level of care appropriate due to severity of illness  Dispo: The patient is from: Home  Anticipated d/c is to: Home  Anticipated d/c date is: 1 day  Patient currently is not medically stable to  d/c. Altered this morning due to hypercarbic respiratory failure from noncompliance with nocturnal CPAP, now placed on BiPAP.   Consultants:   PCCM  Interventional radiology  General surgery  Cardiology - signed off 11/16  [facility] gastroenterology, Dr. Paulita Fujita, Dr. Cristina Gong  Procedures:   IR placement cholecystostomy on 03/17/2020  EGD 11/15  BiPAP 11/18  Subjective: Pt today is on oxygen and is alert but confused. Plan is for d/c with home health.  He is still on oxygen and not on oxygen at baseline. D/w daughter that I will review chart and consider ct chest whithout out contrast. D?w her that we will probably d/c tomorrow.  Per gi and cardiology note pt can restart coumadin.Per abg pt is still hypercarbic.  Cont with cpap overnight and d/c in am.h/h is stable at 8.8. We will obtain ct chest and follow and d/c in am  if pt is stable .   Lab Results  Component Value Date   CREATININE 1.22 04/04/2020   CREATININE 1.23 04/03/2020   CREATININE 1.28 (H) 04/01/2020  Creatinine is improving , and will avoid renally dose meds and contrast studies.   Pt is alert today and is improving although still is requiring oxygen and ct chest showed moderate Bl pleural effusion. Pt is already on po lasix and we will give additional iv doses for today.spoek to daughter and son about his ct results and plan for further diuresis and d/c tomorrow.   Objective: Vitals:   04/04/20 0800 04/04/20 1141 04/04/20 1600 04/04/20 1920  BP:  (!) 154/56  (!) 158/71  Pulse:  72  77  Resp:  18  18  Temp: 98.7 F (37.1 C) 98.7 F (37.1 C) 97.9 F (36.6 C) 97.7 F (36.5 C)  TempSrc: Oral Oral  Oral  SpO2:  90%  96%  Weight:      Height:       SpO2: 96 % O2 Flow Rate (L/min): 5 L/min FiO2 (%): 40 %  Intake/Output Summary (Last 24 hours) at 04/04/2020 2130 Last data filed at 04/04/2020 1949 Gross per 24 hour  Intake 927 ml  Output 1051 ml  Net -124 ml   Filed Weights   03/18/20 0400  03/21/20 1217 03/24/20 0500  Weight: 98.5 kg 102.2 kg 98.4 kg    Examination: Blood pressure (!) 158/71, pulse 77, temperature 97.7 F (36.5 C), temperature source Oral, resp. rate 18, height _0  (1.753 m), weight 98.4 kg, SpO2 96 %. Physical Exam Constitutional:      General: He is awake.     Appearance:  Normal appearance. He is obese.     Interventions: Nasal cannula in place.  HENT:     Head: Normocephalic.     Right Ear: Hearing and external ear normal.     Left Ear: Hearing and external ear normal.     Nose: Nose normal.     Mouth/Throat:     Lips: Pink.     Mouth: Mucous membranes are dry.     Tongue: No lesions. Tongue does not deviate from midline.     Palate: No mass and lesions.     Pharynx: Oropharynx is clear.  Eyes:     General: Lids are normal.     Extraocular Movements: Extraocular movements intact.     Conjunctiva/sclera: Conjunctivae normal.  Neck:     Vascular: No carotid bruit.  Cardiovascular:     Rate and Rhythm: Normal rate. Rhythm irregular.     Pulses: Normal pulses.  Pulmonary:     Effort: Pulmonary effort is normal. No tachypnea, bradypnea, accessory muscle usage, prolonged expiration, respiratory distress or retractions.     Breath sounds: Normal air entry. No decreased air movement. Examination of the right-middle field reveals rales. Examination of the left-middle field reveals rales. Examination of the right-lower field reveals rales. Examination of the left-lower field reveals rales. Rales present.  Abdominal:     General: Bowel sounds are normal. There is no distension or abdominal bruit. There are no signs of injury.     Palpations: Abdomen is soft.  Musculoskeletal:     Cervical back: Full passive range of motion without pain.     Right lower leg: No edema.  Neurological:     Mental Status: He is alert and oriented to person, place, and time.     Cranial Nerves: Cranial nerves are intact.  Psychiatric:        Behavior: Behavior is  cooperative.     Data Reviewed:  I/O last 3 completed shifts: In: 1487 [P.O.:1477; I.V.:10] Out: 1976 [INOMV:6720; Drains:50; Stool:1] Total I/O In: 360 [P.O.:360] Out: -  Lab Results  Component Value Date   CREATININE 1.22 04/04/2020   CREATININE 1.23 04/03/2020   CREATININE 1.28 (H) 04/01/2020   CBC: Recent Labs  Lab 04/01/20 0015 04/01/20 1739 04/02/20 0454 04/03/20 0746 04/04/20 1059  WBC 9.0 7.8 6.5 7.1 8.2  HGB 8.8* 8.7* 8.3* 9.0* 8.8*  HCT 28.9* 28.7* 27.3* 29.9* 29.4*  MCV 107.0* 105.9* 106.2* 108.3* 109.3*  PLT 270 272 226 235 947   Basic Metabolic Panel: Recent Labs  Lab 03/30/20 0059 03/31/20 0033 04/01/20 0015 04/03/20 0746 04/04/20 1333  NA 138 139 141 140 135  K 5.3* 5.1 4.9 4.4 4.7  CL 102 100 102 102 98  CO2 _0 GLUCOSE 209* 192* 91 111* 166*  BUN 40* 43* 45* 42* 39*  CREATININE 1.32* 1.32* 1.28* 1.23 1.22  CALCIUM 8.9 8.9 8.6* 8.9 8.6*  MG  --   --   --  2.3  --   PHOS  --   --   --  3.9  --    GFR: Estimated Creatinine Clearance: 56.8 mL/min (by C-G formula based on SCr of 1.22 mg/dL). Liver Function Tests: Recent Labs  Lab 04/03/20 0746  AST 20  ALT 14  ALKPHOS 131*  BILITOT 0.3  PROT 6.9  ALBUMIN 2.6*   No results for input(s): LIPASE, AMYLASE in the last 168 hours. No results for input(s): AMMONIA in the last 168 hours.  Coagulation Profile: Recent  Labs  Lab 04/01/20 0015 04/02/20 0454 04/02/20 1501 04/03/20 0054 04/04/20 0113  INR 1.6* 1.8* 1.7* 1.7* 1.8*   Cardiac Enzymes: No results for input(s): CKTOTAL, CKMB, CKMBINDEX, TROPONINI in the last 168 hours. BNP (last 3 results) No results for input(s): PROBNP in the last 8760 hours. HbA1C: No results for input(s): HGBA1C in the last 72 hours. CBG: Recent Labs  Lab 04/04/20 0606 04/04/20 0923 04/04/20 1140 04/04/20 1559 04/04/20 2032  GLUCAP 128* 179* 150* 139* 161*   Lipid Profile: No results for input(s): CHOL, HDL, LDLCALC, TRIG, CHOLHDL,  LDLDIRECT in the last 72 hours. Thyroid Function Tests: No results for input(s): TSH, T4TOTAL, FREET4, T3FREE, THYROIDAB in the last 72 hours. Anemia Panel: No results for input(s): VITAMINB12, FOLATE, FERRITIN, TIBC, IRON, RETICCTPCT in the last 72 hours. Sepsis Labs: No results for input(s): PROCALCITON, LATICACIDVEN in the last 168 hours.  No results found for this or any previous visit (from the past 240 hour(s)).    Radiology Studies: CT CHEST WO CONTRAST  Result Date: 04/02/2020 CLINICAL DATA:  Acute hypercarbic respiratory failure EXAM: CT CHEST WITHOUT CONTRAST TECHNIQUE: Multidetector CT imaging of the chest was performed following the standard protocol without IV contrast. COMPARISON:  CT thoracic spine 12/12/2018, radiograph 03/31/2020, CT abdomen pelvis 03/14/2020 FINDINGS: Cardiovascular: Postsurgical changes from prior sternotomy and CABG. Cardiomegaly. Three-vessel coronary artery atherosclerosis. Calcifications of the mitral annulus, aortic leaflets and chordae tendinae. Hypoattenuation of the cardiac blood pool, likely anemia. Atherosclerotic plaque within the normal caliber aorta. No hyperdense mural thickening or plaque displacement. No focal periaortic stranding or hemorrhage. Normal 3 vessel branching of the aortic arch. Proximal great vessels are heavily calcified. Central pulmonary arteries are borderline enlarged which may reflect some pulmonary artery hypertension. Luminal evaluation of the vasculature precluded in the absence of contrast media. Mediastinum/Nodes: Anterior mediastinal stranding likely from prior sternotomy and CABG. No mediastinal fluid or gas. Normal thyroid gland and thoracic inlet. No acute abnormality of the trachea or esophagus. Scattered low-attenuation mediastinal nodes, possibly edematous or reactive. No axillary adenopathy. Hilar nodal evaluation is limited in the absence of intravenous contrast media. Lungs/Pleura: Neck moderate bilateral pleural  effusions with fluid tracking in the fissures. Adjacent dense atelectatic change. Underlying infection is not fully excluded. More diffuse hazy interstitial opacities with fissural and septal thickening and vascular redistribution likely reflecting interstitial and alveolar edema. No pneumothorax. Posterior bowing of the trachea reflecting imaging during exhalation. Upper Abdomen: Lobular liver surface contour, nonspecific though could correlate for findings of intrinsic liver disease/cirrhosis. Upper abdominal atherosclerosis. No acute abnormalities present in the visualized portions of the upper abdomen. Musculoskeletal: Diffuse body wall edema. Bilateral gynecomastia. The osseous structures appear diffusely demineralized which may limit detection of small or nondisplaced fractures. Degenerative changes are present in the imaged spine and shoulders. Prior sternotomy. Prior thoracolumbar fusion extending from T7 to levels below the margin of imaging with bilateral transpedicular screws and posterior fixation rods as well as fusion of the posterior elements. No acute complication is evident. Remote posttraumatic deformity of the T9-T10 levels. Background of multilevel bridging syndesmophytes compatible with ankylosis. Multilevel degenerative changes as well. No acute or worrisome osseous abnormalities. IMPRESSION: 1. Moderate bilateral pleural effusions with fluid tracking in the fissures. Adjacent dense passive and dependent atelectatic changes. 2. Additional features of CHF/volume overload with cardiomegaly, interstitial and likely alveolar edema, and pulmonary vascular congestion. Diffuse body wall edema present as well. 3. Lobular liver surface contour, nonspecific though could correlate for findings of intrinsic liver disease/cirrhosis. 4. Prior  thoracolumbar fusion extending from T7 to levels below the margin of imaging spanning a prior fracture of the T9-10 level on a background of ankylosis. No acute  complication is evident. 5. Aortic Atherosclerosis (ICD10-I70.0). Electronically Signed   By: Lovena Le M.D.   On: 04/02/2020 23:36      Current Facility-Administered Medications (Endocrine & Metabolic):  .  insulin aspart (novoLOG) injection 0-20 Units .  insulin glargine (LANTUS) injection 30 Units   Current Facility-Administered Medications (Cardiovascular):  .  furosemide (LASIX) tablet 40 mg .  hydrALAZINE (APRESOLINE) injection 5 mg .  hydrALAZINE (APRESOLINE) tablet 37.5 mg .  isosorbide mononitrate (IMDUR) 24 hr tablet 30 mg .  rosuvastatin (CRESTOR) tablet 10 mg   Current Facility-Administered Medications (Respiratory):  .  guaiFENesin-dextromethorphan (ROBITUSSIN DM) 100-10 MG/5ML syrup 5 mL   Current Facility-Administered Medications (Analgesics):  .  acetaminophen (TYLENOL) 160 MG/5ML solution 650 mg .  [DISCONTINUED] acetaminophen (TYLENOL) tablet 650 mg **OR** [DISCONTINUED] acetaminophen (TYLENOL) 160 MG/5ML solution 650 mg **OR** acetaminophen (TYLENOL) suppository 650 mg  .  allopurinol (ZYLOPRIM) tablet 100 mg .  traMADol (ULTRAM) tablet 50 mg   Current Facility-Administered Medications (Hematological):  .  ferrous sulfate tablet 325 mg   Current Facility-Administered Medications (Other):  .  0.9 %  sodium chloride infusion (Manually program via Guardrails IV Fluids) .  0.9 %  sodium chloride infusion .  alum & mag hydroxide-simeth (MAALOX/MYLANTA) 200-200-20 MG/5ML suspension 15 mL .  bisacodyl (DULCOLAX) suppository 10 mg .  chlorhexidine gluconate (MEDLINE KIT) (PERIDEX) 0.12 % solution 15 mL .  docusate (COLACE) 50 MG/5ML liquid 100 mg .  famotidine (PEPCID) tablet 20 mg .  feeding supplement (ENSURE ENLIVE / ENSURE PLUS) liquid 237 mL .  gabapentin (NEURONTIN) 250 MG/5ML solution 300 mg .  Gerhardt's butt cream .  loperamide (IMODIUM) capsule 2 mg .  MEDLINE mouth rinse .  ondansetron (ZOFRAN) tablet 4 mg **OR** ondansetron (ZOFRAN) injection 4  mg  .  pantoprazole (PROTONIX) injection 40 mg .  polyethylene glycol (MIRALAX / GLYCOLAX) packet 17 g .  QUEtiapine (SEROQUEL) tablet 50 mg .  sodium chloride flush (NS) 0.9 % injection 3 mL .  sodium chloride flush (NS) 0.9 % injection 5 mL .  sucralfate (CARAFATE) 1 GM/10ML suspension 1 g .  tamsulosin (FLOMAX) capsule 0.4 mg .  Warfarin - Pharmacist Dosing Inpatient  Current Outpatient Medications (Other):  .  sodium chloride 0.9 % injection, Instill 5 mL daily into percutaneous drain.  Anti-infectives (From admission, onward)   Start     Dose/Rate Route Frequency Ordered Stop   03/21/20 1000  amoxicillin-clavulanate (AUGMENTIN) 875-125 MG per tablet 1 tablet        1 tablet Oral Every 12 hours 03/21/20 0719 03/28/20 2128   03/18/20 2200  amoxicillin-clavulanate (AUGMENTIN) 500-125 MG per tablet 500 mg  Status:  Discontinued        1 tablet Oral Every 12 hours 03/18/20 1459 03/18/20 1502   03/18/20 2200  amoxicillin-clavulanate (AUGMENTIN) 250-62.5 MG/5ML suspension 500 mg        500 mg Oral Every 12 hours 03/18/20 1502 03/20/20 2108   03/15/20 2200  piperacillin-tazobactam (ZOSYN) IVPB 3.375 g        3.375 g 12.5 mL/hr over 240 Minutes Intravenous Every 8 hours 03/15/20 1755 03/18/20 1852   03/15/20 1000  ceFEPIme (MAXIPIME) 2 g in sodium chloride 0.9 % 100 mL IVPB  Status:  Discontinued        2 g 200 mL/hr over  30 Minutes Intravenous Every 24 hours 03/14/20 1020 03/15/20 0843   03/15/20 0930  cefTRIAXone (ROCEPHIN) 2 g in sodium chloride 0.9 % 100 mL IVPB  Status:  Discontinued        2 g 200 mL/hr over 30 Minutes Intravenous Every 24 hours 03/15/20 0844 03/15/20 1755   03/14/20 1600  metroNIDAZOLE (FLAGYL) IVPB 500 mg  Status:  Discontinued        500 mg 100 mL/hr over 60 Minutes Intravenous Every 8 hours 03/14/20 1427 03/15/20 1755   03/14/20 0800  ceFEPIme (MAXIPIME) 2 g in sodium chloride 0.9 % 100 mL IVPB        2 g 200 mL/hr over 30 Minutes Intravenous  Once 03/14/20  0752 03/14/20 0845   03/14/20 0800  metroNIDAZOLE (FLAGYL) IVPB 500 mg        500 mg 100 mL/hr over 60 Minutes Intravenous  Once 03/14/20 0752 03/14/20 0935      Scheduled Meds: . sodium chloride   Intravenous Once  . acetaminophen (TYLENOL) oral liquid 160 mg/5 mL  650 mg Oral Q4H  . acetaminophen  650 mg Rectal Q4H  . allopurinol  100 mg Oral BID  . bisacodyl  10 mg Rectal Once  . chlorhexidine gluconate (MEDLINE KIT)  15 mL Mouth Rinse BID  . docusate  100 mg Oral BID  . famotidine  20 mg Oral BID  . feeding supplement  237 mL Oral TID BM  . ferrous sulfate  325 mg Oral Q breakfast  . furosemide  40 mg Oral q AM  . gabapentin  300 mg Oral Q12H  . hydrALAZINE  37.5 mg Oral Q8H  . insulin aspart  0-20 Units Subcutaneous Q4H  . insulin glargine  30 Units Subcutaneous Daily  . isosorbide mononitrate  30 mg Oral Daily  . mouth rinse  15 mL Mouth Rinse BID  . pantoprazole  40 mg Oral Daily  . polyethylene glycol  17 g Oral BID  . QUEtiapine  50 mg Oral Q supper  . rosuvastatin  10 mg Oral QHS  . sodium chloride flush  3 mL Intravenous Q12H  . sodium chloride flush  5 mL Intracatheter Q8H  . sucralfate  1 g Oral TID WC & HS  . tamsulosin  0.4 mg Oral QPC supper  . warfarin  5 mg Oral ONCE-1600  . Warfarin - Pharmacist Dosing Inpatient   Does not apply q1600   Continuous Infusions: . sodium chloride Stopped (03/17/20 2016)     LOS: 21 days  TIME: 60 minutes.   Para Skeans, MD [facility] Pager [phone] How to contact the [facility] Attending or Consulting provider [place] or covering provider during after hours [place], for this patient?    1. Check the care team in [facility] and look for a) attending/consulting TRH provider listed and b) the [name] team listed 2. Log into [email or web address] and use [place]'s universal password to access. If you do not have the password, please contact the hospital operator. 3. Locate the [facility] provider you are looking for under [facility] and page to a number that you can be directly reached. 4. If you still have difficulty reaching the provider, please page the [facility] (Director on Call) for the Hospitalists listed on amion for assistance. [email or web address] Password [facility] 04/04/2020, 9:30 PM

## 2020-04-04 NOTE — Progress Notes (Signed)
Referring Physician(s): Dr [place]  Supervising Physician: Gilmer Mor  Patient Status:  [facility] - In-pt  Chief Complaint:  Percutaneous cholecystostomy tube drain Placed in IR 03/16/20   Subjective:  Perc chole in place Delay in DC secondary respiratory issues  OP orders for chole drain check in place He will hear from IR OP scheduled for time and date of follow up ~6-8 weeks from placement  Allergies: Patient has no known allergies.  Medications: Prior to Admission medications   Medication Sig Start Date End Date Taking? Authorizing Provider  acetaminophen (TYLENOL) 500 MG tablet Take 500-1,000 mg by mouth every 6 (six) hours as needed for mild pain or headache.   Yes [provider]  allopurinol (ZYLOPRIM) 100 MG tablet TAKE 2 TABLETS BY MOUTH DAILY. Patient taking differently: Take 100 mg by mouth 2 (two) times daily.  05/03/14  Yes Runell Gess, MD  colchicine 0.6 MG tablet Take 0.6 mg by mouth daily as needed (gout attacks).   Yes [provider]  ferrous sulfate 325 (65 FE) MG tablet Take 1 tablet (325 mg total) by mouth 2 (two) times daily with a meal. Patient taking differently: Take 325 mg by mouth daily with breakfast.  08/09/16  Yes Ghimire, Werner Lean, MD  furosemide (LASIX) 40 MG tablet Take 1 tablet (40 mg total) by mouth 2 (two) times daily. Patient taking differently: Take 40 mg by mouth in the morning.  08/24/16  Yes Osvaldo Shipper, MD  gabapentin (NEURONTIN) 300 MG capsule Take 300-600 mg by mouth See admin instructions. Take 300 mg by mouth three times a day and 600 mg at bedtime   Yes [provider]  glipiZIDE (GLUCOTROL) 5 MG tablet Take 5 mg by mouth 2 (two) times daily before a meal.   Yes [provider]  hydrALAZINE (APRESOLINE) 100 MG tablet Take 100 mg by mouth 2 (two) times daily.   Yes [provider]  insulin aspart protamine- aspart (NOVOLOG MIX 70/30) (70-30) 100 UNIT/ML injection Inject 50  Units into the skin 2 (two) times daily before a meal.    Yes [provider]  isosorbide mononitrate (IMDUR) 60 MG 24 hr tablet Take 1 tablet (60 mg total) by mouth daily. Appointment needed for future refills Patient taking differently: Take 60 mg by mouth daily.  05/03/15  Yes Runell Gess, MD  metolazone (ZAROXOLYN) 2.5 MG tablet Take 2.5 mg by mouth daily as needed (AS DIRECTED FOR MARKED SWELLING).   Yes [provider]  Multiple Vitamin (MULTIVITAMIN WITH MINERALS) TABS tablet Take 1 tablet by mouth daily.   Yes [provider]  nitroGLYCERIN (NITROSTAT) 0.4 MG SL tablet Place 0.4 mg under the tongue every 5 (five) minutes as needed for chest pain.   Yes [provider]  pantoprazole (PROTONIX) 40 MG tablet Take 40 mg by mouth 2 (two) times daily before a meal.    Yes [provider]  potassium chloride (K-DUR) 10 MEQ tablet Take 1 tablet (10 mEq total) by mouth daily. 08/31/16  Yes Barrett, Joline Salt, PA-C  pravastatin (PRAVACHOL) 40 MG tablet Take 40 mg by mouth at bedtime.    Yes [provider]  PRESCRIPTION MEDICATION Inhale into the lungs See admin instructions. CPAP- At bedtime and during any time of rest   Yes [provider]  Psyllium (METAMUCIL PO) Take 2 capsules by mouth at bedtime.   Yes [provider]  QUEtiapine (SEROQUEL) 50 MG tablet Take 50 mg by mouth  at bedtime.    Yes [provider]  warfarin (COUMADIN) 1 MG tablet Take 1 mg by mouth daily as needed (in conjunction with the 5 mg strength, AS DIRECTED- depending on INR).  12/18/19  Yes [provider]  warfarin (COUMADIN) 5 MG tablet Take 5 mg by mouth daily with supper. 01/28/20  Yes [provider]  docusate sodium (COLACE) 100 MG capsule Take 1 capsule (100 mg total) by mouth 2 (two) times daily. Patient not taking: Reported on 03/14/2020 12/16/18   Jadene Pierini, MD  oxyCODONE (OXY IR/ROXICODONE) 5 MG immediate  release tablet Take 1 tablet (5 mg total) by mouth every 4 (four) hours as needed (pain). Patient not taking: Reported on 03/14/2020 12/16/18   Jadene Pierini, MD  sodium chloride 0.9 % injection Instill 5 mL daily into percutaneous drain. 03/21/20 05/13/20  Lynnette Caffey A, PA-C     Vital Signs: BP (!) 128/51 (BP Location: Left Arm)   Pulse 70   Temp (!) 97.4 F (36.3 C) (Oral)   Resp (!) 23   Ht 5\' 9"  (1.753 m)   Wt 216 lb 14.9 oz (98.4 kg)   SpO2 94%   BMI 32.04 kg/m   Physical Exam Skin:    General: Skin is warm.     Comments: Site is clean and dry NT no bleeding No sign of infection  OP 40 cc in bag Dark bloody OP Flushes easily     Imaging: CT CHEST WO CONTRAST  Result Date: 04/02/2020 CLINICAL DATA:  Acute hypercarbic respiratory failure EXAM: CT CHEST WITHOUT CONTRAST TECHNIQUE: Multidetector CT imaging of the chest was performed following the standard protocol without IV contrast. COMPARISON:  CT thoracic spine 12/12/2018, radiograph 03/31/2020, CT abdomen pelvis 03/14/2020 FINDINGS: Cardiovascular: Postsurgical changes from prior sternotomy and CABG. Cardiomegaly. Three-vessel coronary artery atherosclerosis. Calcifications of the mitral annulus, aortic leaflets and chordae tendinae. Hypoattenuation of the cardiac blood pool, likely anemia. Atherosclerotic plaque within the normal caliber aorta. No hyperdense mural thickening or plaque displacement. No focal periaortic stranding or hemorrhage. Normal 3 vessel branching of the aortic arch. Proximal great vessels are heavily calcified. Central pulmonary arteries are borderline enlarged which may reflect some pulmonary artery hypertension. Luminal evaluation of the vasculature precluded in the absence of contrast media. Mediastinum/Nodes: Anterior mediastinal stranding likely from prior sternotomy and CABG. No mediastinal fluid or gas. Normal thyroid gland and thoracic inlet. No acute abnormality of the trachea or  esophagus. Scattered low-attenuation mediastinal nodes, possibly edematous or reactive. No axillary adenopathy. Hilar nodal evaluation is limited in the absence of intravenous contrast media. Lungs/Pleura: Neck moderate bilateral pleural effusions with fluid tracking in the fissures. Adjacent dense atelectatic change. Underlying infection is not fully excluded. More diffuse hazy interstitial opacities with fissural and septal thickening and vascular redistribution likely reflecting interstitial and alveolar edema. No pneumothorax. Posterior bowing of the trachea reflecting imaging during exhalation. Upper Abdomen: Lobular liver surface contour, nonspecific though could correlate for findings of intrinsic liver disease/cirrhosis. Upper abdominal atherosclerosis. No acute abnormalities present in the visualized portions of the upper abdomen. Musculoskeletal: Diffuse body wall edema. Bilateral gynecomastia. The osseous structures appear diffusely demineralized which may limit detection of small or nondisplaced fractures. Degenerative changes are present in the imaged spine and shoulders. Prior sternotomy. Prior thoracolumbar fusion extending from T7 to levels below the margin of imaging with bilateral transpedicular screws and posterior fixation rods as well as fusion of the posterior elements. No acute complication is evident. Remote posttraumatic deformity of  the T9-T10 levels. Background of multilevel bridging syndesmophytes compatible with ankylosis. Multilevel degenerative changes as well. No acute or worrisome osseous abnormalities. IMPRESSION: 1. Moderate bilateral pleural effusions with fluid tracking in the fissures. Adjacent dense passive and dependent atelectatic changes. 2. Additional features of CHF/volume overload with cardiomegaly, interstitial and likely alveolar edema, and pulmonary vascular congestion. Diffuse body wall edema present as well. 3. Lobular liver surface contour, nonspecific though could  correlate for findings of intrinsic liver disease/cirrhosis. 4. Prior thoracolumbar fusion extending from T7 to levels below the margin of imaging spanning a prior fracture of the T9-10 level on a background of ankylosis. No acute complication is evident. 5. Aortic Atherosclerosis (ICD10-I70.0). Electronically Signed   By: Kreg Shropshire M.D.   On: 04/02/2020 23:36    Labs:  CBC: Recent Labs    04/01/20 0015 04/01/20 1739 04/02/20 0454 04/03/20 0746  WBC 9.0 7.8 6.5 7.1  HGB 8.8* 8.7* 8.3* 9.0*  HCT 28.9* 28.7* 27.3* 29.9*  PLT 270 272 226 235    COAGS: Recent Labs    03/14/20 0911 03/15/20 0643 03/15/20 1757 03/18/20 0229 04/02/20 0454 04/02/20 1501 04/03/20 0054 04/04/20 0113  INR 2.7*   < > 1.5*   < > 1.8* 1.7* 1.7* 1.8*  APTT 44*  --  35  --   --   --   --   --    < > = values in this interval not displayed.    BMP: Recent Labs    03/30/20 0059 03/31/20 0033 04/01/20 0015 04/03/20 0746  NA 138 139 141 140  K 5.3* 5.1 4.9 4.4  CL 102 100 102 102  CO2 30 31 30 30   GLUCOSE 209* 192* 91 111*  BUN 40* 43* 45* 42*  CALCIUM 8.9 8.9 8.6* 8.9  CREATININE 1.32* 1.32* 1.28* 1.23  GFRNONAA 55* 55* 57* 60*    LIVER FUNCTION TESTS: Recent Labs    03/14/20 0743 03/24/20 0028 04/03/20 0746  BILITOT 1.1 0.5 0.3  AST 24 22 20   ALT 13 30 14   ALKPHOS 82 73 131*  PROT 7.8 6.0* 6.9  ALBUMIN 2.8* 2.0* 2.6*    Assessment and Plan:  Perc chole drain placed 11/3 Will need to see IR OP Clinic for eval ~6-8 weeks post placement He will hear from scheduler for time and date (already ordered) Please flush at home 5 cc sterile saline daily and record OP   Electronically Signed: , PA-C 04/04/2020, 8:20 AM   I spent a total of 15 Minutes at the the patient's bedside AND on the patient's hospital floor or unit, greater than 50% of which was counseling/coordinating care for perc chole drain    Patient ID: [id number], male   DOB: April 19, 1941, 79 y.o.    MRN: Oran Rein

## 2020-04-04 NOTE — Progress Notes (Signed)
Inpatient Diabetes Program Recommendations  AACE/ADA: New Consensus Statement on Inpatient Glycemic Control (2015)  Target Ranges:  Prepandial:   less than 140 mg/dL      Peak postprandial:   less than 180 mg/dL (1-2 hours)      Critically ill patients:  140 - 180 mg/dL   Lab Results  Component Value Date   GLUCAP 150 (H) 04/04/2020   HGBA1C 12.1 (H) 03/14/2020    Review of Glycemic Control Results for RANNIE, CRANEY (MRN [id number]) as of 04/04/2020 13:30  Ref. Range 04/04/2020 00:00 04/04/2020 04:49 04/04/2020 06:06 04/04/2020 09:23 04/04/2020 11:40  Glucose-Capillary Latest Ref Range: 70 - 99 mg/dL 009 (H) 65 (L) 381 (H) 179 (H) 150 (H)   Inpatient Diabetes Program Recommendations:   Please consider decrease in Novolog correction to sensitive 0-9 tid + hs 0-5 since patient is eating. Secure chat sent to Dr. Allena Katz.  Thank you, Billy Fischer. Sevastian Witczak, RN, MSN, CDE  Diabetes Coordinator Inpatient Glycemic Control Team Team Pager [phone] (8am-5pm) 04/04/2020 1:31 PM

## 2020-04-04 NOTE — Progress Notes (Signed)
Became agitated claiming not feeling good and hard to breath, on o2 4L [place]  with pulse ox -94% breathing easy and regular.MD aware with order.

## 2020-04-05 DIAGNOSIS — A419 Sepsis, unspecified organism: Secondary | ICD-10-CM | POA: Diagnosis not present

## 2020-04-05 LAB — BASIC METABOLIC PANEL
Anion gap: 8 (ref 5–15)
BUN: 39 mg/dL — ABNORMAL HIGH (ref 8–23)
CO2: 34 mmol/L — ABNORMAL HIGH (ref 22–32)
Calcium: 8.5 mg/dL — ABNORMAL LOW (ref 8.9–10.3)
Chloride: 94 mmol/L — ABNORMAL LOW (ref 98–111)
Creatinine, Ser: 1.25 mg/dL — ABNORMAL HIGH (ref 0.61–1.24)
GFR, Estimated: 59 mL/min — ABNORMAL LOW (ref >60)
Glucose, Bld: 170 mg/dL — ABNORMAL HIGH (ref 70–99)
Potassium: 4.4 mmol/L (ref 3.5–5.1)
Sodium: 136 mmol/L (ref 135–145)

## 2020-04-05 LAB — CBC
HCT: 28.4 % — ABNORMAL LOW (ref 39.0–52.0)
Hemoglobin: 8.7 g/dL — ABNORMAL LOW (ref 13.0–17.0)
MCH: 33.2 pg (ref 26.0–34.0)
MCHC: 30.6 g/dL (ref 30.0–36.0)
MCV: 108.4 fL — ABNORMAL HIGH (ref 80.0–100.0)
Platelets: 233 10*3/uL (ref 150–400)
RBC: 2.62 MIL/uL — ABNORMAL LOW (ref 4.22–5.81)
RDW: 18 % — ABNORMAL HIGH (ref 11.5–15.5)
WBC: 7.5 10*3/uL (ref 4.0–10.5)
nRBC: 0 % (ref 0.0–0.2)

## 2020-04-05 LAB — PROTIME-INR
INR: 2.9 — ABNORMAL HIGH (ref 0.8–1.2)
INR: 3 — ABNORMAL HIGH (ref 0.8–1.2)
Prothrombin Time: 29 seconds — ABNORMAL HIGH (ref 11.4–15.2)
Prothrombin Time: 30.5 seconds — ABNORMAL HIGH (ref 11.4–15.2)

## 2020-04-05 LAB — GLUCOSE, CAPILLARY
Glucose-Capillary: 116 mg/dL — ABNORMAL HIGH (ref 70–99)
Glucose-Capillary: 132 mg/dL — ABNORMAL HIGH (ref 70–99)

## 2020-04-05 LAB — MAGNESIUM: Magnesium: 1.9 mg/dL (ref 1.7–2.4)

## 2020-04-05 MED ORDER — INSULIN ASPART PROT & ASPART (70-30 MIX) 100 UNIT/ML ~~LOC~~ SUSP: 50.0000 [IU] | Freq: Two times a day (BID) | SUBCUTANEOUS | 0 refills | Status: DC

## 2020-04-05 MED ORDER — FUROSEMIDE 40 MG PO TABS: ORAL_TABLET | ORAL | Status: DC

## 2020-04-05 MED ORDER — INSULIN GLARGINE 100 UNIT/ML ~~LOC~~ SOLN: 30.0000 [IU] | Freq: Every day | SUBCUTANEOUS | 11 refills | Status: DC

## 2020-04-05 MED ORDER — SODIUM CHLORIDE 0.9% FLUSH: INTRAVENOUS | 0 refills | Status: DC

## 2020-04-05 NOTE — TOC Transition Note (Signed)
Transition of Care [facility]) - CM/SW Discharge Note   Patient Details  Name: ARMSTEAD HEILAND MRN: [id number] Date of Birth: [birth year]  [facility] CM/SW Contact:  Leone Haven, RN Phone Number: 04/05/2020, 1:16 PM   Clinical Narrative:    Patient is for dc today, he is set up with [facility] for HHRN, HHPT, HHOT. Soc will be tomorrow.  Daughter is a Charity fundraiser also .  Cheryl with [facility] was informed about the drain flushes tid.  ptar transport has been scheduled for 2 pm today. Ambulance forms on unit.    Final next level of care: Home w Home Health Services Barriers to Discharge: No Barriers Identified   Patient Goals and CMS Choice Patient states their goals for this hospitalization and ongoing recovery are:: return home with family CMS Medicare.gov Compare Post Acute Care list provided to:: Patient Represenative (must comment) Choice offered to / list presented to : Adult Children  Discharge Placement                       Discharge Plan and Services   Discharge Planning Services: CM Consult Post Acute Care Choice: Durable Medical Equipment, Home Health          DME Arranged: Hospital bed, Oxygen DME Agency: [employer] Date DME Agency Contacted: 03/20/20 Time DME Agency Contacted: 1100 Representative spoke with at DME Agency: [employer] [facility] HH Arranged: RN, PT, OT HH Agency: [facility] Date [facility] Agency Contacted: 04/05/20 Time HH Agency Contacted: [phone] Representative spoke with at [facility] Agency: Elnita Maxwell  Social Determinants of Health (SDOH) Interventions     Readmission Risk Interventions No flowsheet data found.

## 2020-04-05 NOTE — Progress Notes (Signed)
ANTICOAGULATION CONSULT NOTE - Follow-Up  Pharmacy Consult for warfarin Indication: atrial fibrillation  No Known Allergies  Patient Measurements: Height: 5\' 9"  (175.3 cm) Weight: 98.4 kg (216 lb 14.9 oz) IBW/kg (Calculated) : 70.7 Heparin Dosing Weight: 90.1 kg  Vital Signs: Temp: 98.1 F (36.7 C) (11/23 0800) Temp Source: Oral (11/23 0800) BP: 154/71 (11/23 0305) Pulse Rate: 63 (11/23 0305)  Labs: Recent Labs    04/03/20 0054 04/03/20 0746 04/03/20 0746 04/04/20 0113 04/04/20 1059 04/04/20 1333 04/05/20 0017 04/05/20 0842  HGB  --  9.0*   < >  --  8.8*  --  8.7*  --   HCT  --  29.9*  --   --  29.4*  --  28.4*  --   PLT  --  235  --   --  252  --  233  --   LABPROT   < >  --   --  20.5*  --   --  29.0* 30.5*  INR   < >  --   --  1.8*  --   --  2.9* 3.0*  CREATININE  --  1.23  --   --   --  1.22 1.25*  --    < > = values in this interval not displayed.    Estimated Creatinine Clearance: 55.4 mL/min (A) (by C-G formula based on SCr of 1.25 mg/dL (H)).   Medical History: Past Medical History:  Diagnosis Date  . Atrial fibrillation (HCC)    on Coumadin  . CAD (coronary artery disease)    s/p remote CABG, stent in 2019  . CHF (congestive heart failure) (HCC)   . Dementia (HCC)   . Diabetes mellitus   . DVT (deep venous thrombosis) (HCC)   . Fall at home 10/2015  . Hyperlipidemia   . Hypertension   . Left-sided carotid artery disease (HCC)   . MRSA infection   . Myocardial infarction (HCC)   . Obesity   . Peripheral vascular disease (HCC)   . Sleep apnea   . Stroke (HCC)   . Stroke (HCC)   . Venous insufficiency      Assessment: 30 you male with a history of PVD, CAD, HTN, HLD, DM and afib on warfarin presents with an NSTEMI. Pharmacy was consulted to dose heparin. PTA the patient was on warfarin at 5 mg daily with an INR goal of 2-3. It is unknown whether or not the patient was therapeutic while on this dose.  Endoscopy 11/15 showed no active  bleeding, possible gastritis. GI added Carafate and patient cleared for anticoagulation.   INR with quick jump from 1.8 > 2.9 despite matching PTA dosing fairly closely.  Repeated INR this AM to verify = 3.  No overt bleeding or complications noted.  Hgb low but appears stable.  Goal of Therapy:  INR 2-3 Monitor platelets by anticoagulation protocol: Yes   Plan:  No Coumadin tonight. Daily INR  12/15, Reece Leader, [facility] Clinical Pharmacist  04/05/2020 10:14 AM   [facility] pharmacy phone numbers are listed on [email or web address]

## 2020-04-05 NOTE — Progress Notes (Signed)
Daughter Lupita Leash returned the call who claimed that she knows how to do dressing in the drain area and how to empty and do flushing. Also claimed that home health Rn is coming to see him tom. Instructed to call  If she has  questions regarding  the discharge instructions.

## 2020-04-05 NOTE — Discharge Summary (Addendum)
Physician Discharge Summary  Isaiah [name] ZOX:[id number] DOB: Nov 09, 1940 DOA: 03/14/2020  PCP: [name], Isaiah T, MD  Admit date: 03/14/2020 Discharge date: 04/05/2020   Discharge Diagnoses/Plan:  Discharge Condition:  Stable  Diet recommendation:  Low sodium 1800 mg , cardiac , carbohydrate consistent diet.   Filed Weights   03/18/20 0400 03/21/20 1217 03/24/20 0500  Weight: 98.5 kg 102.2 kg 98.4 kg    Discharge activity: Per PT fall / aspiration precaution.   History of present illness:  Isaiah [name] Isaiah [name] a 79 y.o.malewith medical history significant ofPVD; CAD; HTN; HLD; DM; afib on Coumadin; and CHF presenting with weakness and a buttock pressure ulcer.Everything was normal Friday, it was a great day. Saturday AM, he was complaining of gas and bloating. He sat in his lift chair during the day. That evening, he was somewhat confused. He didn't eat that day. Sunday, he slept all day. She tried to get him up and he was too weak to stand, mumbling, confused. He is having RUQ abdominal pain. No fever at home, although he was flushed. He has had a pressure ulcer since back surgery a year ago, has a scab over it and it pulled off.    ED Course:Concern for sepsis, AKI, likely cholecystitis. Abdominal pain Friday, delirium and weakness over the weekend. Febrile to 100.6, hypoxic on arrival. Significant RUQ TTP. Labs with AKI, leukocytosis. CT with concern for gallbladder pathology. Surgery to evaluate. ?volume overload so not given 30 cc/kg bolus. New cirrhosis on CT, negative NH4. Myoclonic jerking, likely encephalopathy plus Neurontin with AKI.  Overview: Pt admitted on 03/14/2020 for Abdominal pain, sepsis and ct in ed showed layering gallstones c/w acute cholecystitis.Pt was seen by general surgery same day and started on maxipime and flagyl, and due to poor medical condition pt was offered percutaneous cholecystostomy tube, after INR is subtherapeutic, for  which pt was given vit k. Plan was for cardiac clearance then proceeding to Cholecystomy in 8 weeks.   03/15/2020 :Cardiac arrest. While down in IR on 03/15/2020 pt had a brief cardiac arrest . Patient in IR for procedure, elected to perform under general anesthesia due to severe OSA, renal failure. After intubation and before Perc tube placement, patient became hypotensive and bradycardic, lost pulses. CPR initiated, completed 1 round and 1 round of epi and pulses resumed. Levophed started, IV fluids. Pt was then trasnferred to ICU.  On 03/17/2011 pt had  successful placement of per chole by interventional radiology . Pt had echo done on 03/16/2020: Echocardiogram (03/16/2020) 1. Severe biventricular dysfunction with at least mild-moderate secondary  MR and TR.  2. Left ventricular ejection fraction, by estimation, is 20%. The left  ventricle has severely decreased function. The left ventricle demonstrates  global hypokinesis. There is mild left ventricular hypertrophy. Left  ventricular diastolic parameters are  indeterminate. There is the interventricular septum is flattened in  systole and diastole, consistent with right ventricular pressure and  volume overload.  3. Right ventricular systolic function is severely reduced. The right  ventricular size is severely enlarged.  4. Left atrial size was mild to moderately dilated.  5. Right atrial size was severely dilated.  6. The mitral valve is normal in structure, and demonstrates tenting due  to LV dysfunction. Mild to moderate mitral valve regurgitation. No  evidence of mitral stenosis.  7. Tricuspid valve regurgitation is mild to moderate.  8. The aortic valve is grossly normal. There is mild calcification of the  aortic valve. Aortic valve regurgitation is trivial.  No aortic stenosis is  present.   Left Heart Cath: (2018)  Ost Cx to Prox Cx lesion, 100 %stenosed. SVG to OM patent, with Y graft SVG to diagonal originating  from mid portion of the OM graft. Moderate disease in the origin of the Y graft portion to the diagonal.  Mid LAD lesion, 70 %stenosed. Dist LAD lesion, 100 %stenosed. LIMA to LAD is widely patent.  Ost RCA to Prox RCA lesion, 80 %stenosed. A STENT XIENCE ALPINE RX [id number] drug eluting stent was successfully placed.  Post intervention, there is a 0% residual stenosis.  LV end diastolic pressure is moderately elevated.  There is no aortic valve stenosis. Pt was extubated on 03/17/2020 and was following commands. Pt's heart failure was stable and off pressors and GDMT not initiated due to AKI and Bradycardia. Pt's elevated troponin was attributed demand ischemia.ANd for his atrial fibrillation with bradycardia Anticoagulation was d.c due to anemia. AKI was improving. Pt was transferred to [facility] on the 6th. His heart failure was being managed with iv diuretics and decreased ef of 20% was attributed to post cardiac arrest. Pt remained in chronic a.fib and AC was held due to anemia and bleeding on cholecystostomy tube.Pt was also guaiac positive and therefore coumadin held. Pt was continued on iv abx for his septic shock from a/c cholecystitis.  Plan was for discharge on 03/22/2020 with IR drain and oxygen. Pt continued to get iv abx and PT OT was consulted and DME was delivered home for him. Gi was consulted for hie anemia  and guaiac positive  stool on 03/24/2020.  Pt was seen by cardiology dr.C and improved and became euvolemic and repeat echo as done and ef was improved to 55-60 % with grade ii diastolic dysfunction.  Pt s/p egd  11/15/2021which Patchy mildly erythematous mucosa without bleeding was found in the  gastric fundus, resolving stress gastritis . Pt's creatinine also improved to 1.18 from 3.45. Per TOC note pt will need dme order. Per gen surgery note pt needs to have his drain flushed tid with 10 ml of saline and may keep covered with dry guaze.    [name] Course:  Pt since  egd has been stable, we have been managing his I/O and inr due to anemia and need for ongoing coumadin. Pt was ready for d/c about three days ago but sob became worse and was in chf exacerbation and needed to be on iv diuretic therapy.  D/w daughter about plan for d/c today and need to f/u with all his specialist and pcp in the next two weeks. Pt is to f/u with GI and gen surg . Pt was transfused for his anemia attributed to his UGI irritation. Coumadin was continued as h/h is stable.  D/w daughter about plan for daily lasix except for M/W/F he should take it twice daily.  And to monitor his weight daily.   Procedures: 03/18/20 EGD 03/16/20 Percutaneous cholecystostomy tube placement.  Consultations:  Cardiology  GI  TOC  PT/OT  Diabetes coordinator   Discharge Exam: Vitals:   04/05/20 0728 04/05/20 0800  BP:    Pulse:    Resp:    Temp:  98.1 F (36.7 C)  SpO2: 94%     Physical Exam Vitals and nursing note reviewed.  Constitutional:      General: He is not in acute distress.    Appearance: He is obese. He is not ill-appearing, toxic-appearing or diaphoretic.  HENT:     Head: Normocephalic and  atraumatic.     Right Ear: External ear normal.     Left Ear: External ear normal.     Mouth/Throat:     Mouth: Mucous membranes are moist.  Eyes:     Extraocular Movements: Extraocular movements intact.  Cardiovascular:     Rate and Rhythm: Normal rate.     Pulses: Normal pulses.     Heart sounds: Normal heart sounds.  Pulmonary:     Effort: Pulmonary effort is normal.     Breath sounds: Normal breath sounds.  Abdominal:     Palpations: Abdomen is soft.  Musculoskeletal:        General: No swelling or deformity. Normal range of motion.     Right lower leg: No edema.     Left lower leg: No edema.  Skin:    General: Skin is warm.  Neurological:     General: No focal deficit present.     Mental Status: He is alert and oriented to person, place, and time.       Discharge Instructions   Discharge Instructions    Call MD for:  difficulty breathing, headache or visual disturbances   Complete by: As directed    Call MD for:  extreme fatigue   Complete by: As directed    Call MD for:  hives   Complete by: As directed    Call MD for:  persistant dizziness or light-headedness   Complete by: As directed    Call MD for:  persistant nausea and vomiting   Complete by: As directed    Call MD for:  redness, tenderness, or signs of infection (pain, swelling, redness, odor or green/yellow discharge around incision site)   Complete by: As directed    Call MD for:  severe uncontrolled pain   Complete by: As directed    Call MD for:  temperature >100.4   Complete by: As directed    Change dressing (specify)   Complete by: As directed    Lean buttocks with soap and water pat dry and and apply barrier cream to both buttocks generously, the the hydrocolloid dressing.  Keep Right cholecystostomy drain covered with dry gauze. Contact general surgery for questions.   Diet - low sodium heart healthy   Complete by: As directed    Discharge instructions   Complete by: As directed    Please keep all follow up appt.  Please limit sodium 1800 mg.   Increase activity slowly   Complete by: As directed      Allergies as of 04/05/2020   No Known Allergies     Medication List    TAKE these medications   acetaminophen 500 MG tablet Commonly known as: TYLENOL Take 500-1,000 mg by mouth every 6 (six) hours as needed for mild pain or headache.   allopurinol 100 MG tablet Commonly known as: ZYLOPRIM TAKE 2 TABLETS BY MOUTH DAILY. What changed:   how much to take  when to take this   colchicine 0.6 MG tablet Take 0.6 mg by mouth daily as needed (gout attacks).   docusate sodium 100 MG capsule Commonly known as: COLACE Take 1 capsule (100 mg total) by mouth 2 (two) times daily.   ferrous sulfate 325 (65 FE) MG tablet Take 1 tablet (325 mg  total) by mouth 2 (two) times daily with a meal. What changed: when to take this   furosemide 40 MG tablet Commonly known as: LASIX 40 mg daily except for Monday/Wednesday/ and Friday take 40 mg  TWICE A DAY. What changed:   how much to take  how to take this  when to take this  additional instructions   gabapentin 300 MG capsule Commonly known as: NEURONTIN Take 300-600 mg by mouth See admin instructions. Take 300 mg by mouth three times a day and 600 mg at bedtime   glipiZIDE 5 MG tablet Commonly known as: GLUCOTROL Take 5 mg by mouth 2 (two) times daily before a meal.   hydrALAZINE 100 MG tablet Commonly known as: APRESOLINE Take 100 mg by mouth 2 (two) times daily.   insulin aspart protamine- aspart (70-30) 100 UNIT/ML injection Commonly known as: NOVOLOG MIX 70/30 Inject 0.5 mLs (50 Units total) into the skin 2 (two) times daily before a meal.   isosorbide mononitrate 60 MG 24 hr tablet Commonly known as: IMDUR Take 1 tablet (60 mg total) by mouth daily. Appointment needed for future refills What changed: additional instructions   METAMUCIL PO Take 2 capsules by mouth at bedtime.   metolazone 2.5 MG tablet Commonly known as: ZAROXOLYN Take 2.5 mg by mouth daily as needed (AS DIRECTED FOR MARKED SWELLING).   multivitamin with minerals Tabs tablet Take 1 tablet by mouth daily.   nitroGLYCERIN 0.4 MG SL tablet Commonly known as: NITROSTAT Place 0.4 mg under the tongue every 5 (five) minutes as needed for chest pain.   oxyCODONE 5 MG immediate release tablet Commonly known as: Oxy IR/ROXICODONE Take 1 tablet (5 mg total) by mouth every 4 (four) hours as needed (pain).   pantoprazole 40 MG tablet Commonly known as: PROTONIX Take 40 mg by mouth 2 (two) times daily before a meal.   potassium chloride 10 MEQ tablet Commonly known as: KLOR-CON Take 1 tablet (10 mEq total) by mouth daily.   pravastatin 40 MG tablet Commonly known as: PRAVACHOL Take 40 mg by  mouth at bedtime.   PRESCRIPTION MEDICATION Inhale into the lungs See admin instructions. CPAP- At bedtime and during any time of rest   QUEtiapine 50 MG tablet Commonly known as: SEROQUEL Take 50 mg by mouth at bedtime.   sodium chloride 0.9 % injection Instill 5 mL daily into percutaneous drain.   sodium chloride flush 0.9 % Soln Commonly known as: NS Instill 5 mL daily into percutaneous drain daily x 8 weeks.   warfarin 1 MG tablet Commonly known as: COUMADIN Take 1 mg by mouth daily as needed (in conjunction with the 5 mg strength, AS DIRECTED- depending on INR).   warfarin 5 MG tablet Commonly known as: COUMADIN Take 5 mg by mouth daily with supper.            Durable Medical Equipment  (From admission, onward)         Start     Ordered   03/23/20 1216  For home use only DME oxygen  Once       Question Answer Comment  Length of Need 12 Months   Mode or (Route) Nasal cannula   Liters per Minute 2   Frequency Continuous (stationary and portable oxygen unit needed)   Oxygen conserving device Yes   Oxygen delivery system Gas      03/23/20 1216   03/22/20 1300  For home use only DME oxygen  Once       Question Answer Comment  Length of Need Lifetime   Liters per Minute 2   Frequency Continuous (stationary and portable oxygen unit needed)   Oxygen delivery system Gas      03/22/20  1259   03/20/20 1448  For home use only DME [name] bed  Once       Question Answer Comment  Length of Need 12 Months   Patient has (list medical condition): heart failure, and stage 2 sacral pressure ulcer   The above medical condition requires: Patient requires the ability to reposition frequently   Head must be elevated greater than: 30 degrees   Bed type Semi-electric   Hoyer Lift Yes   Support Surface: Gel Overlay      03/20/20 1448           Discharge Care Instructions  (From admission, onward)         Start     Ordered   04/05/20 0000  Change dressing  (specify)       Comments: Lean buttocks with soap and water pat dry and and apply barrier cream to both buttocks generously, the the hydrocolloid dressing.  Keep Right cholecystostomy drain covered with dry gauze. Contact general surgery for questions.   04/05/20 1204         No Known Allergies  Follow-up Information    Diamantina Monks, MD. Call on 04/21/2020.   Specialty: Surgery Why: Your appointment is 12/09 @ 11am  Please arrive 30 minutes prior to your appointment to check in and fill out paperwork. Bring photo ID, insurance information, and updated list of medications and allergies. Contact information: [address] [place] [address] [phone]        [facility] Follow up.   Why: hosptial bed and lift arranged- to be delivered to home prior to discharge, home oxygen Contact information: [address] [place] [address] [phone]        Sterling Big, MD Follow up.   Specialties: Interventional Radiology, Radiology Why: IR scheduler will call you with appointment date/time (typically 6-8 weeks after your procedure). Please call with any questions or concerns prior to your appointment. Contact information: [address] [place] [address] [phone]        [facility] Follow up.   Why: HHRN,HHPT,HHOT Contact information: [address] [place] [address] [phone]                The results of significant diagnostics from this hospitalization (including imaging, microbiology, ancillary and laboratory) are listed below for reference.    Significant Diagnostic Studies: CT ABDOMEN PELVIS WO CONTRAST  Result Date: 03/14/2020 CLINICAL DATA:  Right upper quadrant abdominal pain EXAM: CT ABDOMEN AND PELVIS WITHOUT CONTRAST TECHNIQUE: Multidetector CT imaging of the abdomen and pelvis was performed following the standard protocol without IV contrast. COMPARISON:  12/08/2018  FINDINGS: Motion degraded images. Lower chest: Trace bilateral pleural effusions. Associated lower lobe opacities, likely atelectasis. Hepatobiliary: Cirrhosis.  No focal hepatic lesion is seen. Distended gallbladder with layering gallstones (series 3/image 36). Mild pericholecystic stranding (series 3/image 40), raising concern for acute cholecystitis. No intrahepatic or extrahepatic ductal dilatation. Pancreas: Within normal limits. Spleen: Within normal limits. Adrenals/Urinary Tract: Adrenal glands are within normal limits. 2.3 cm medial right lower pole renal cyst (series 3/image 45). Bilateral kidneys are otherwise unremarkable, noting renal vascular calcifications. No hydronephrosis. Mildly thick-walled bladder, although underdistended. Stomach/Bowel: Stomach is within normal limits. No evidence of bowel obstruction. Appendix is not discretely visualized. Scattered sigmoid diverticulosis, without evidence of diverticulitis. Vascular/Lymphatic: No evidence of abdominal aortic aneurysm. Extensive atherosclerotic calcifications of the abdominal aorta and branch vessels. No suspicious abdominopelvic lymphadenopathy.  Reproductive: Prostate is unremarkable. Other: No abdominopelvic ascites. Musculoskeletal: Degenerative changes of the visualized thoracolumbar spine. Posterior thoracic fixation ending at L1, incompletely visualized. Median sternotomy. IMPRESSION: Motion degraded images. Distended gallbladder with layering gallstones and mild pericholecystic stranding, raising concern for acute cholecystitis. Additional ancillary findings as above. Electronically Signed   By: Charline Bills M.D.   On: 03/14/2020 09:50   CT Head Wo Contrast  Result Date: 03/14/2020 CLINICAL DATA:  Altered mental status/delirium EXAM: CT HEAD WITHOUT CONTRAST TECHNIQUE: Contiguous axial images were obtained from the base of the skull through the vertex without intravenous contrast. COMPARISON:  08/11/2016 FINDINGS: Brain: No  evidence of acute infarction, hemorrhage, hydrocephalus, extra-axial collection or mass lesion/mass effect. Global cortical atrophy. Subcortical white matter and periventricular small vessel ischemic changes. Vascular: Intracranial atherosclerosis. Skull: Normal. Negative for fracture or focal lesion. Sinuses/Orbits: The visualized paranasal sinuses are essentially clear. The mastoid air cells are unopacified. Other: None. IMPRESSION: No evidence of acute intracranial abnormality. Atrophy with small vessel ischemic changes. Electronically Signed   By: Charline Bills M.D.   On: 03/14/2020 09:36   CT CHEST WO CONTRAST  Result Date: 04/02/2020 CLINICAL DATA:  Acute hypercarbic respiratory failure EXAM: CT CHEST WITHOUT CONTRAST TECHNIQUE: Multidetector CT imaging of the chest was performed following the standard protocol without IV contrast. COMPARISON:  CT thoracic spine 12/12/2018, radiograph 03/31/2020, CT abdomen pelvis 03/14/2020 FINDINGS: Cardiovascular: Postsurgical changes from prior sternotomy and CABG. Cardiomegaly. Three-vessel coronary artery atherosclerosis. Calcifications of the mitral annulus, aortic leaflets and chordae tendinae. Hypoattenuation of the cardiac blood pool, likely anemia. Atherosclerotic plaque within the normal caliber aorta. No hyperdense mural thickening or plaque displacement. No focal periaortic stranding or hemorrhage. Normal 3 vessel branching of the aortic arch. Proximal great vessels are heavily calcified. Central pulmonary arteries are borderline enlarged which may reflect some pulmonary artery hypertension. Luminal evaluation of the vasculature precluded in the absence of contrast media. Mediastinum/Nodes: Anterior mediastinal stranding likely from prior sternotomy and CABG. No mediastinal fluid or gas. Normal thyroid gland and thoracic inlet. No acute abnormality of the trachea or esophagus. Scattered low-attenuation mediastinal nodes, possibly edematous or reactive.  No axillary adenopathy. Hilar nodal evaluation is limited in the absence of intravenous contrast media. Lungs/Pleura: Neck moderate bilateral pleural effusions with fluid tracking in the fissures. Adjacent dense atelectatic change. Underlying infection is not fully excluded. More diffuse hazy interstitial opacities with fissural and septal thickening and vascular redistribution likely reflecting interstitial and alveolar edema. No pneumothorax. Posterior bowing of the trachea reflecting imaging during exhalation. Upper Abdomen: Lobular liver surface contour, nonspecific though could correlate for findings of intrinsic liver disease/cirrhosis. Upper abdominal atherosclerosis. No acute abnormalities present in the visualized portions of the upper abdomen. Musculoskeletal: Diffuse body wall edema. Bilateral gynecomastia. The osseous structures appear diffusely demineralized which may limit detection of small or nondisplaced fractures. Degenerative changes are present in the imaged spine and shoulders. Prior sternotomy. Prior thoracolumbar fusion extending from T7 to levels below the margin of imaging with bilateral transpedicular screws and posterior fixation rods as well as fusion of the posterior elements. No acute complication is evident. Remote posttraumatic deformity of the T9-T10 levels. Background of multilevel bridging syndesmophytes compatible with ankylosis. Multilevel degenerative changes as well. No acute or worrisome osseous abnormalities. IMPRESSION: 1. Moderate bilateral pleural effusions with fluid tracking in the fissures. Adjacent dense passive and dependent atelectatic changes. 2. Additional features of CHF/volume overload with cardiomegaly, interstitial and likely alveolar edema, and pulmonary vascular congestion. Diffuse body wall edema present as  well. 3. Lobular liver surface contour, nonspecific though could correlate for findings of intrinsic liver disease/cirrhosis. 4. Prior thoracolumbar  fusion extending from T7 to levels below the margin of imaging spanning a prior fracture of the T9-10 level on a background of ankylosis. No acute complication is evident. 5. Aortic Atherosclerosis (ICD10-I70.0). Electronically Signed   By: Kreg Shropshire M.D.   On: 04/02/2020 23:36   IR Perc Cholecystostomy  Result Date: 03/16/2020 INDICATION: 79 year old male with acute calculus cholecystitis and florid sepsis. He presents for second attempt at percutaneous cholecystostomy tube placement after being stabilized from acute cardiac arrest. EXAM: CHOLECYSTOSTOMY MEDICATIONS: In patient currently receiving intravenous Zosyn. No additional antibiotic prophylaxis administered. ANESTHESIA/SEDATION: Moderate (conscious) sedation was employed during this procedure. A total of Versed 1 mg and Fentanyl 25 mcg was administered intravenously. Moderate Sedation Time: 13 minutes minutes. The patient's level of consciousness and vital signs were monitored continuously by radiology nursing throughout the procedure under my direct supervision. FLUOROSCOPY TIME:  Fluoroscopy Time: 0 minutes 36 seconds (5 mGy). COMPLICATIONS: None immediate. PROCEDURE: Informed written consent was obtained from the patient after a thorough discussion of the procedural risks, benefits and alternatives. All questions were addressed. Maximal Sterile Barrier Technique was utilized including caps, mask, sterile gowns, sterile gloves, sterile drape, hand hygiene and skin antiseptic. A timeout was performed prior to the initiation of the procedure. The right upper quadrant was interrogated with ultrasound. Loops of colon can be seen interposed between the abdominal wall and liver. A suitable skin entry site that would facilitate a transhepatic access without transgressing the colon was identified. Local anesthesia was attained by infiltration with 1% lidocaine. A small dermatotomy was made. Under real-time ultrasound guidance, a 21 gauge Accustick needle  was advanced through the liver and into the gallbladder lumen. The stylet was removed and there was return of dark brown bile. A 0.018 wire was advanced in the gallbladder lumen. The Accustick needle was then exchanged for the transitional Accustick sheath which was advanced into the gallbladder lumen. Contrast injection confirms opacification of the gallbladder lumen. A 0.035 J wire was then advanced through the Accustick sheath and into the gallbladder. The Accustick sheath was removed. The transhepatic tract was percutaneously dilated to 10 [place]. A Cook 10.2 [place] all-purpose drainage catheter was advanced over the wire and formed in the gallbladder. Aspiration yields dark brown viscous bile. A sample was obtained and sent for culture. The catheter was gently flushed and connected to gravity bag drainage. The catheter was secured to the skin with 0 Prolene suture. IMPRESSION: Successful placement of percutaneous transhepatic cholecystostomy tube for acute calculus cholecystitis in a non operative candidate. Electronically Signed   By: Malachy Moan M.D.   On: 03/16/2020 16:19   DG CHEST PORT 1 VIEW  Result Date: 03/31/2020 CLINICAL DATA:  Short of breath.  Weakness. EXAM: PORTABLE CHEST 1 VIEW COMPARISON:  03/20/2020 FINDINGS: Slight interval worsening in lung aeration. Mild increase in the hazy airspace opacities in the mid and lower lungs. More consolidation noted at the left lung base partly silhouetting the left hemidiaphragm. Small bilateral pleural effusions have likely increased. No pneumothorax. IMPRESSION: 1. Mild interval worsening in lung aeration with findings suggesting congestive heart failure with a mild increase in pleural effusions and either atelectasis or airspace edema in the mid to lower lungs. Electronically Signed   By: Amie Portland M.D.   On: 03/31/2020 08:26   DG CHEST PORT 1 VIEW  Result Date: 03/20/2020 CLINICAL DATA:  79 year old male with  history of sudden onset of  chest pain. Shortness of breath. EXAM: PORTABLE CHEST 1 VIEW COMPARISON:  Chest x-ray 03/15/2020. FINDINGS: Previously noted endotracheal and nasogastric tubes have been removed. Lung volumes are low. Bibasilar opacities may reflect areas of atelectasis and/or consolidation. Trace bilateral pleural effusions. Cephalization of the pulmonary vasculature with mild indistinctness of interstitial markings. Mild cardiomegaly. No pneumothorax. Upper mediastinal contours are within normal limits. Aortic atherosclerosis. Status post median sternotomy for CABG including LIMA. Orthopedic fixation rods throughout the lower thoracic and upper lumbar spine incompletely imaged. IMPRESSION: 1. The appearance the chest suggests mild congestive heart failure, as above. 2. Aortic atherosclerosis. 3. Postoperative changes, as above. Electronically Signed   By: Trudie Reed M.D.   On: 03/20/2020 15:41   DG CHEST PORT 1 VIEW  Result Date: 03/15/2020 CLINICAL DATA:  Endotracheal tube placement EXAM: PORTABLE CHEST 1 VIEW COMPARISON:  03/14/2020 FINDINGS: Cardiac shadow is enlarged but stable. Postsurgical changes are again seen. Endotracheal tube is noted in satisfactory position 3.5 cm above the carina. Gastric catheter extends into the stomach. Lungs are hypoinflated with minimal left basilar atelectasis. Mild vascular congestion is noted. No bony abnormality is seen. IMPRESSION: Endotracheal tube and gastric catheter in satisfactory position. Mild vascular congestion. Mild left basilar atelectasis new from the prior exam. Electronically Signed   By: Alcide Clever M.D.   On: 03/15/2020 18:39   DG Chest Portable 1 View  Result Date: 03/14/2020 CLINICAL DATA:  Shortness of breath and fatigue EXAM: PORTABLE CHEST 1 VIEW COMPARISON:  12/08/2018 FINDINGS: Signs of previous CABG procedure. Aortic atherosclerotic calcifications. Heart size normal. Small pleural effusions are suspected with blunting of the costophrenic angles.  Pulmonary vascular congestion. IMPRESSION: Suspect small bilateral pleural effusions and pulmonary vascular congestion. No signs of pneumonia. Electronically Signed   By: Signa Kell M.D.   On: 03/14/2020 07:02   ECHOCARDIOGRAM COMPLETE  Result Date: 03/16/2020    ECHOCARDIOGRAM REPORT   Patient Name:   Isaiah HANE [name] Date of Exam: 03/16/2020 Medical Rec #:  [id number]      Height:       69.0 in Accession #:    [id number]     Weight:       207.7 lb Date of Birth:  1940/06/19      BSA:          2.099 m Patient Age:    79 years       BP:           114/64 mmHg Patient Gender: M              HR:           62 bpm. Exam Location:  Inpatient Procedure: 2D Echo, Cardiac Doppler, Color Doppler and Intracardiac            Opacification Agent Indications:    Cardiac arrest  History:        Patient has prior history of Echocardiogram examinations, most                 recent 08/09/2016. CHF, CAD, Arrythmias:Atrial Fibrillation; Risk                 Factors:Hypertension and Dyslipidemia. H/O DVT and CVA.  Sonographer:    Ross Ludwig RDCS (AE) Referring Phys: [id number] Lorin Glass  Sonographer Comments: Technically difficult study due to poor echo windows. IMPRESSIONS  1. Severe biventricular dysfunction with at least mild-moderate secondary MR and TR.  2. Left ventricular ejection fraction,  by estimation, is 20%. The left ventricle has severely decreased function. The left ventricle demonstrates global hypokinesis. There is mild left ventricular hypertrophy. Left ventricular diastolic parameters are indeterminate. There is the interventricular septum is flattened in systole and diastole, consistent with right ventricular pressure and volume overload.  3. Right ventricular systolic function is severely reduced. The right ventricular size is severely enlarged.  4. Left atrial size was mild to moderately dilated.  5. Right atrial size was severely dilated.  6. The mitral valve is normal in structure, and demonstrates tenting  due to LV dysfunction. Mild to moderate mitral valve regurgitation. No evidence of mitral stenosis.  7. Tricuspid valve regurgitation is mild to moderate.  8. The aortic valve is grossly normal. There is mild calcification of the aortic valve. Aortic valve regurgitation is trivial. No aortic stenosis is present. Conclusion(s)/Recommendation(s): No definite left ventricular mural or apical thrombus/thrombi. Swirling apical contrast. FINDINGS  Left Ventricle: Left ventricular ejection fraction, by estimation, is 20%. The left ventricle has severely decreased function. The left ventricle demonstrates global hypokinesis. Definity contrast agent was given IV to delineate the left ventricular endocardial borders. The left ventricular internal cavity size was normal in size. There is mild left ventricular hypertrophy. The interventricular septum is flattened in systole and diastole, consistent with right ventricular pressure and volume overload. Left ventricular diastolic parameters are indeterminate. Right Ventricle: The right ventricular size is severely enlarged. Right vetricular wall thickness was not well visualized. Right ventricular systolic function is severely reduced. Left Atrium: Left atrial size was mild to moderately dilated. Right Atrium: Right atrial size was severely dilated. Pericardium: There is no evidence of pericardial effusion. Mitral Valve: The mitral valve is normal in structure. Mild to moderate mitral valve regurgitation. No evidence of mitral valve stenosis. MV peak gradient, 3.6 mmHg. The mean mitral valve gradient is 1.0 mmHg. Tricuspid Valve: The tricuspid valve is normal in structure. Tricuspid valve regurgitation is mild to moderate. No evidence of tricuspid stenosis. Aortic Valve: The aortic valve is grossly normal. There is mild calcification of the aortic valve. Aortic valve regurgitation is trivial. No aortic stenosis is present. Aortic valve mean gradient measures 2.0 mmHg. Aortic valve  peak gradient measures 3.5  mmHg. Aortic valve area, by VTI measures 1.61 cm. Pulmonic Valve: The pulmonic valve was not well visualized. Pulmonic valve regurgitation is trivial. No evidence of pulmonic stenosis. Aorta: The aortic root is normal in size and structure. Venous: The inferior vena cava was not well visualized. IVC assessment for right atrial pressure unable to be performed due to mechanical ventilation. IAS/Shunts: No atrial level shunt detected by color flow Doppler.  LEFT VENTRICLE PLAX 2D LVIDd:         4.03 cm  Diastology LVIDs:         3.03 cm  LV e' medial:    4.73 cm/s LV PW:         1.30 cm  LV E/e' medial:  15.4 LV IVS:        1.30 cm  LV e' lateral:   6.25 cm/s LVOT diam:     1.80 cm  LV E/e' lateral: 11.7 LV SV:         24 LV SV Index:   11 LVOT Area:     2.54 cm  RIGHT VENTRICLE RV Basal diam:  4.74 cm RV Mid diam:    4.95 cm RV S prime:     4.00 cm/s TAPSE (M-mode): 0.9 cm LEFT ATRIUM  Index       RIGHT ATRIUM           Index LA diam:        4.40 cm 2.10 cm/m  RA Area:     32.10 cm LA Vol (A2C):   80.4 ml 38.30 ml/m RA Volume:   121.00 ml 57.64 ml/m LA Vol (A4C):   74.2 ml 35.34 ml/m LA Biplane Vol: 78.4 ml 37.35 ml/m  AORTIC VALVE AV Area (Vmax):    1.64 cm AV Area (Vmean):   1.59 cm AV Area (VTI):     1.61 cm AV Vmax:           93.50 cm/s AV Vmean:          60.900 cm/s AV VTI:            0.147 m AV Peak Grad:      3.5 mmHg AV Mean Grad:      2.0 mmHg LVOT Vmax:         60.40 cm/s LVOT Vmean:        38.000 cm/s LVOT VTI:          0.093 m LVOT/AV VTI ratio: 0.63  AORTA Ao Root diam: 3.10 cm Ao Asc diam:  3.50 cm MITRAL VALVE               TRICUSPID VALVE MV Area (PHT): 3.53 cm    TR Peak grad:   23.6 mmHg MV Peak grad:  3.6 mmHg    TR Vmax:        243.00 cm/s MV Mean grad:  1.0 mmHg MV Vmax:       0.95 m/s    SHUNTS MV Vmean:      44.3 cm/s   Systemic VTI:  0.09 m MV Decel Time: 215 msec    Systemic Diam: 1.80 cm MV E velocity: 72.90 cm/s MV A velocity: 48.00 cm/s MV  E/A ratio:  1.52 Weston Brass MD Electronically signed by Weston Brass MD Signature Date/Time: 03/16/2020/10:35:55 AM    Final    ECHOCARDIOGRAM LIMITED  Result Date: 03/26/2020    ECHOCARDIOGRAM LIMITED REPORT   Patient Name:   Isaiah [name] Date of Exam: 03/26/2020 Medical Rec #:  [id number]      Height:       69.0 in Accession #:    [id number]     Weight:       216.9 lb Date of Birth:  [birth year]      BSA:          2.139 m Patient Age:    79 years       BP:           124/50 mmHg Patient Gender: M              HR:           70 bpm. Exam Location:  Inpatient Procedure: Limited Echo, Cardiac Doppler, Color Doppler and Intracardiac            Opacification Agent Indications:    CHF  History:        Patient has prior history of Echocardiogram examinations, most                 recent 03/16/2020. CHF, Previous Myocardial Infarction and CAD,                 PAD, Arrythmias:Atrial Fibrillation and Cardiac Arrest; Risk  Factors:Hypertension, Dyslipidemia and Morbid obesity.  Sonographer:    Lavenia Atlas Referring Phys: [phone] MIHAI CROITORU  Sonographer Comments: Patient is morbidly obese. Image acquisition challenging due to patient body habitus. IMPRESSIONS  1. Limited study; all views not obtained; akinesis of the distal inferolateral wall with overall normal LV systolic function.  2. Left ventricular ejection fraction, by estimation, is 55 to 60%. The left ventricle has normal function. The left ventricle demonstrates regional wall motion abnormalities (see scoring diagram/findings for description). There is mild left ventricular  hypertrophy. Left ventricular diastolic parameters are consistent with Grade II diastolic dysfunction (pseudonormalization). Elevated left atrial pressure.  3. Right ventricular systolic function is normal. The right ventricular size is mildly enlarged. There is severely elevated pulmonary artery systolic pressure.  4. Right atrial size was mildly dilated.  5. The  mitral valve is normal in structure. Mild mitral valve regurgitation. No evidence of mitral stenosis.  6. The aortic valve was not assessed. No aortic stenosis is present. FINDINGS  Left Ventricle: Left ventricular ejection fraction, by estimation, is 55 to 60%. The left ventricle has normal function. The left ventricle demonstrates regional wall motion abnormalities. Definity contrast agent was given IV to delineate the left ventricular endocardial borders. The left ventricular internal cavity size was normal in size. There is mild left ventricular hypertrophy. Left ventricular diastolic parameters are consistent with Grade II diastolic dysfunction (pseudonormalization). Elevated left atrial pressure. Right Ventricle: The right ventricular size is mildly enlarged.Right ventricular systolic function is normal. There is severely elevated pulmonary artery systolic pressure. Left Atrium: Left atrial size was normal in size. Right Atrium: Right atrial size was mildly dilated. Pericardium: There is no evidence of pericardial effusion. Mitral Valve: The mitral valve is normal in structure. Mild mitral annular calcification. Mild mitral valve regurgitation. No evidence of mitral valve stenosis. Tricuspid Valve: The tricuspid valve is normal in structure. Tricuspid valve regurgitation is mild . No evidence of tricuspid stenosis. Aortic Valve: The aortic valve was not assessed. No aortic stenosis is present. Pulmonic Valve: The pulmonic valve was not assessed. Aorta: The aortic root is normal in size and structure. Venous: The inferior vena cava was not well visualized.  Additional Comments: Limited study; all views not obtained; akinesis of the distal inferolateral wall with overall normal LV systolic function. LEFT VENTRICLE PLAX 2D LVIDd:         4.70 cm Diastology LVIDs:         3.85 cm LV e' medial:    5.77 cm/s LV PW:         1.20 cm LV E/e' medial:  23.1 LV IVS:        1.10 cm LV e' lateral:   11.40 cm/s                         LV E/e' lateral: 11.7  RIGHT VENTRICLE RV S prime:     6.53 cm/s AORTIC VALVE LVOT Vmax:   77.50 cm/s LVOT Vmean:  51.000 cm/s LVOT VTI:    0.168 m MITRAL VALVE                TRICUSPID VALVE MV Area (PHT): 4.80 cm     TR Peak grad:   76.4 mmHg MV Decel Time: 158 msec     TR Vmax:        437.00 cm/s MV E velocity: 133.00 cm/s MV A velocity: 70.00 cm/s   SHUNTS MV E/A ratio:  1.90  Systemic VTI: 0.17 m Olga Millers MD Electronically signed by Olga Millers MD Signature Date/Time: 03/26/2020/4:02:54 PM    Final     Microbiology: No results found for this or any previous visit (from the past 240 hour(s)).   Labs: Basic Metabolic Panel: Recent Labs  Lab 03/31/20 0033 04/01/20 0015 04/03/20 0746 04/04/20 1333 04/05/20 0017  NA 139 141 140 135 136  K 5.1 4.9 4.4 4.7 4.4  CL 100 102 102 98 94*  CO2 31 30 30 29  34*  GLUCOSE 192* 91 111* 166* 170*  BUN 43* 45* 42* 39* 39*  CREATININE 1.32* 1.28* 1.23 1.22 1.25*  CALCIUM 8.9 8.6* 8.9 8.6* 8.5*  MG  --   --  2.3  --  1.9  PHOS  --   --  3.9  --   --    Liver Function Tests: Recent Labs  Lab 04/03/20 0746  AST 20  ALT 14  ALKPHOS 131*  BILITOT 0.3  PROT 6.9  ALBUMIN 2.6*   No results for input(s): LIPASE, AMYLASE in the last 168 hours. No results for input(s): AMMONIA in the last 168 hours. CBC: Recent Labs  Lab 04/01/20 1739 04/02/20 0454 04/03/20 0746 04/04/20 1059 04/05/20 0017  WBC 7.8 6.5 7.1 8.2 7.5  HGB 8.7* 8.3* 9.0* 8.8* 8.7*  HCT 28.7* 27.3* 29.9* 29.4* 28.4*  MCV 105.9* 106.2* 108.3* 109.3* 108.4*  PLT 272 226 235 252 233   Cardiac Enzymes: No results for input(s): CKTOTAL, CKMB, CKMBINDEX, TROPONINI in the last 168 hours. BNP: BNP (last 3 results) Recent Labs    03/14/20 0752 03/22/20 0117  BNP 532.7* 441.2*    ProBNP (last 3 results) No results for input(s): PROBNP in the last 8760 hours.  CBG: Recent Labs  Lab 04/04/20 1140 04/04/20 1559 04/04/20 2032 04/05/20 0612  04/05/20 1143  GLUCAP 150* 139* 161* 116* 132*      Time spent:35 minutes  Signed:  04/07/20 MD.  [facility] 04/05/2020, 3:12 PM

## 2020-04-05 NOTE — TOC Progression Note (Addendum)
Transition of Care [facility]) - Progression Note    Patient Details  Name: Isaiah [name] MRN: [id number] Date of Birth: [birth year]  Transition of [facility] CM/SW Contact  Leone Haven, RN Phone Number: 04/05/2020, 1:03 PM  Clinical Narrative:    NCM received call from [facility] with [facility] stating they can not take patient now since they will not have staffing to do the drain. NCM informed daughter , Lupita Leash, she states she is a Designer, jewellery and she knows how to do the drain, I am making referral to [place] with [facility] , daughter is in agreement, Soc will begin tomorrow.      Expected Discharge Plan: Skilled Nursing Facility Barriers to Discharge: Continued Medical Work up  Expected Discharge Plan and Services Expected Discharge Plan: Skilled Nursing Facility   Discharge Planning Services: CM Consult Post Acute Care Choice: Durable Medical Equipment, Home Health Living arrangements for the past 2 months: Single Family Home Expected Discharge Date: 04/05/20               DME Arranged: Hospital bed, Other see comment DME Agency: [employer] Date DME Agency Contacted: 03/20/20 Time DME Agency Contacted: [phone] Representative spoke with at DME Agency: Shari Heritage HH Arranged: RN, PT, OT HH Agency: [facility] Date [facility] Agency Contacted: 03/20/20 Time HH Agency Contacted: [phone] Representative spoke with at [facility] Agency: Dondra Spry- request to call back on Monday 11/8   Social Determinants of Health (SDOH) Interventions    Readmission Risk Interventions No flowsheet data found.

## 2020-04-05 NOTE — Progress Notes (Signed)
Physical Therapy Treatment Patient Details Name: Isaiah [name] MRN: [id number] DOB: [birth year] Today's Date: 04/05/2020    History of Present Illness Isaiah [name] is a 79 y.o. male with medical history significant of PVD; CAD; HTN; HLD; DM; afib on Coumadin; and CHF presenting with weakness and a buttock pressure ulcer. Pt with sepsis and R UQ abdominal pain, found to have cholecystitis. 11/2 Pt was undergoing anesthesia for a perc chole drain and coded. Mar 25, 2023 perc chole performed. Pt intubated 11/2-11/4    PT Comments    [name] is a new man today. He is awake, alert and very participatory. Pt shows good progress towards his goals today, however continues to have decreased strength and balance. Pt is min A for bed mobility, min Ax2 for sit<>stand and able to static stand in [place] for 45 sec. Pt is hopeful for d/c home prior to Thanksgiving. PT will continue to follow acutely.      Follow Up Recommendations  Supervision/Assistance - 24 hour;Home health PT     Equipment Recommendations  Hospital bed       Precautions / Restrictions Precautions Precautions: Fall Precaution Comments: incontinent of bowel, perc chole drain, sacral wound Restrictions Weight Bearing Restrictions: No    Mobility  Bed Mobility Overal bed mobility: Needs Assistance Bed Mobility: Supine to Sit     Supine to sit: Min assist;HOB elevated     General bed mobility comments: pt able to walk LE off bed and bring his trunk to upright, min A for pad scoot of hips to EoB  Transfers Overall transfer level: Needs assistance   Transfers: Sit to/from Stand Sit to Stand: Min assist;+2 physical assistance         General transfer comment: good power up, min Ax2 for steadying with pulling up to [employer] Overall balance assessment: Needs assistance Sitting-balance support: No upper extremity supported;Feet supported Sitting balance-Leahy Scale: Fair     Standing balance support:  Bilateral upper extremity supported Standing balance-Leahy Scale: Poor Standing balance comment: requires UE support                            Cognition Arousal/Alertness: Awake/alert Behavior During Therapy: WFL for tasks assessed/performed Overall Cognitive Status: Impaired/Different from baseline Area of Impairment: Following commands;Problem solving                       Following Commands: Follows multi-step commands consistently     Problem Solving: Requires verbal cues;Requires tactile cues General Comments: pt very alert and participatory today, requires cuing but is able to follow better today      Exercises Other Exercises Other Exercises: static standing in [place] x 45 sec    General Comments General comments (skin integrity, edema, etc.): VSS on 5L O2 via [place]      Pertinent Vitals/Pain Pain Assessment: No/denies pain Pain Location: generalized moaning with movement     PT Goals (current goals can now be found in the care plan section) Acute Rehab PT Goals Patient Stated Goal: unable to state PT Goal Formulation: With patient/family Time For Goal Achievement: 04/14/20 Potential to Achieve Goals: Fair Progress towards PT goals: Progressing toward goals    Frequency    Min 3X/week      PT Plan Current plan remains appropriate       AM-PAC PT "6 Clicks" Mobility   Outcome Measure  Help needed turning  from your back to your side while in a flat bed without using bedrails?: Total Help needed moving from lying on your back to sitting on the side of a flat bed without using bedrails?: Total Help needed moving to and from a bed to a chair (including a wheelchair)?: Total Help needed standing up from a chair using your arms (e.g., wheelchair or bedside chair)?: Total Help needed to walk in hospital room?: Total Help needed climbing 3-5 steps with a railing? : Total 6 Click Score: 6    End of Session Equipment Utilized During  Treatment: Gait belt;Oxygen Activity Tolerance: Patient limited by lethargy;Patient limited by fatigue Patient left: with call bell/phone within reach;in chair;with chair alarm set   PT Visit Diagnosis: Unsteadiness on feet (R26.81);Other abnormalities of gait and mobility (R26.89);Muscle weakness (generalized) (M62.81);Difficulty in walking, not elsewhere classified (R26.2);Pain     Time: 8757-9728 PT Time Calculation (min) (ACUTE ONLY): 22 min  Charges:  $Therapeutic Activity: 8-22 mins                     Shya Kovatch B. Beverely Risen PT, DPT Acute Rehabilitation Services Pager [phone] Office [phone]    Elon Alas Fleet 04/05/2020, 10:24 AM

## 2020-04-05 NOTE — Progress Notes (Signed)
Called daughter Lupita Leash 3x to give the discharge instructions as requested by the patient and to give instructions on how to do dressing from the drain site. Still awaiting return call.  [facility] picked up pt for transport, copy of discharge instructions given. Belongings with pt.

## 2020-04-17 NOTE — Progress Notes (Deleted)
Error. No show.

## 2020-04-20 ENCOUNTER — Ambulatory Visit: Payer: Medicare Other | Admitting: General Practice

## 2020-05-20 ENCOUNTER — Emergency Department (HOSPITAL_COMMUNITY): Payer: Medicare Other

## 2020-05-20 ENCOUNTER — Encounter (HOSPITAL_COMMUNITY): Payer: Self-pay | Admitting: Emergency Medicine

## 2020-05-20 ENCOUNTER — Other Ambulatory Visit: Payer: Self-pay

## 2020-05-20 ENCOUNTER — Inpatient Hospital Stay (HOSPITAL_COMMUNITY)
Admission: EM | Admit: 2020-05-20 | Discharge: 2020-06-10 | DRG: 286 | Disposition: A | Discharge status: Home Health | Payer: Medicare Other | Attending: Internal Medicine | Admitting: Internal Medicine

## 2020-05-20 DIAGNOSIS — J9690 Respiratory failure, unspecified, unspecified whether with hypoxia or hypercapnia: Secondary | ICD-10-CM

## 2020-05-20 DIAGNOSIS — J9811 Atelectasis: Secondary | ICD-10-CM | POA: Diagnosis present

## 2020-05-20 DIAGNOSIS — Z934 Other artificial openings of gastrointestinal tract status: Secondary | ICD-10-CM

## 2020-05-20 DIAGNOSIS — Z95828 Presence of other vascular implants and grafts: Secondary | ICD-10-CM

## 2020-05-20 DIAGNOSIS — Z7901 Long term (current) use of anticoagulants: Secondary | ICD-10-CM

## 2020-05-20 DIAGNOSIS — Y92230 Patient room in hospital as the place of occurrence of the external cause: Secondary | ICD-10-CM | POA: Diagnosis not present

## 2020-05-20 DIAGNOSIS — I495 Sick sinus syndrome: Secondary | ICD-10-CM | POA: Diagnosis present

## 2020-05-20 DIAGNOSIS — I5043 Acute on chronic combined systolic (congestive) and diastolic (congestive) heart failure: Secondary | ICD-10-CM | POA: Diagnosis present

## 2020-05-20 DIAGNOSIS — I2729 Other secondary pulmonary hypertension: Secondary | ICD-10-CM | POA: Diagnosis present

## 2020-05-20 DIAGNOSIS — G928 Other toxic encephalopathy: Secondary | ICD-10-CM | POA: Diagnosis not present

## 2020-05-20 DIAGNOSIS — I441 Atrioventricular block, second degree: Secondary | ICD-10-CM | POA: Diagnosis not present

## 2020-05-20 DIAGNOSIS — J90 Pleural effusion, not elsewhere classified: Secondary | ICD-10-CM

## 2020-05-20 DIAGNOSIS — E876 Hypokalemia: Secondary | ICD-10-CM | POA: Diagnosis not present

## 2020-05-20 DIAGNOSIS — I2510 Atherosclerotic heart disease of native coronary artery without angina pectoris: Secondary | ICD-10-CM | POA: Diagnosis present

## 2020-05-20 DIAGNOSIS — Z974 Presence of external hearing-aid: Secondary | ICD-10-CM

## 2020-05-20 DIAGNOSIS — F0390 Unspecified dementia without behavioral disturbance: Secondary | ICD-10-CM | POA: Diagnosis present

## 2020-05-20 DIAGNOSIS — K819 Cholecystitis, unspecified: Secondary | ICD-10-CM

## 2020-05-20 DIAGNOSIS — H9193 Unspecified hearing loss, bilateral: Secondary | ICD-10-CM | POA: Diagnosis present

## 2020-05-20 DIAGNOSIS — Z83438 Family history of other disorder of lipoprotein metabolism and other lipidemia: Secondary | ICD-10-CM

## 2020-05-20 DIAGNOSIS — E662 Morbid (severe) obesity with alveolar hypoventilation: Secondary | ICD-10-CM | POA: Diagnosis present

## 2020-05-20 DIAGNOSIS — E871 Hypo-osmolality and hyponatremia: Secondary | ICD-10-CM | POA: Diagnosis present

## 2020-05-20 DIAGNOSIS — Z8673 Personal history of transient ischemic attack (TIA), and cerebral infarction without residual deficits: Secondary | ICD-10-CM

## 2020-05-20 DIAGNOSIS — I252 Old myocardial infarction: Secondary | ICD-10-CM

## 2020-05-20 DIAGNOSIS — I10 Essential (primary) hypertension: Secondary | ICD-10-CM | POA: Diagnosis not present

## 2020-05-20 DIAGNOSIS — Z951 Presence of aortocoronary bypass graft: Secondary | ICD-10-CM

## 2020-05-20 DIAGNOSIS — L89619 Pressure ulcer of right heel, unspecified stage: Secondary | ICD-10-CM | POA: Diagnosis not present

## 2020-05-20 DIAGNOSIS — Z794 Long term (current) use of insulin: Secondary | ICD-10-CM | POA: Diagnosis not present

## 2020-05-20 DIAGNOSIS — E874 Mixed disorder of acid-base balance: Secondary | ICD-10-CM | POA: Diagnosis not present

## 2020-05-20 DIAGNOSIS — Z79899 Other long term (current) drug therapy: Secondary | ICD-10-CM

## 2020-05-20 DIAGNOSIS — E785 Hyperlipidemia, unspecified: Secondary | ICD-10-CM | POA: Diagnosis present

## 2020-05-20 DIAGNOSIS — J9622 Acute and chronic respiratory failure with hypercapnia: Secondary | ICD-10-CM | POA: Diagnosis present

## 2020-05-20 DIAGNOSIS — N1831 Chronic kidney disease, stage 3a: Secondary | ICD-10-CM | POA: Diagnosis present

## 2020-05-20 DIAGNOSIS — N179 Acute kidney failure, unspecified: Secondary | ICD-10-CM

## 2020-05-20 DIAGNOSIS — E1151 Type 2 diabetes mellitus with diabetic peripheral angiopathy without gangrene: Secondary | ICD-10-CM | POA: Diagnosis present

## 2020-05-20 DIAGNOSIS — I482 Chronic atrial fibrillation, unspecified: Secondary | ICD-10-CM | POA: Diagnosis present

## 2020-05-20 DIAGNOSIS — J9611 Chronic respiratory failure with hypoxia: Secondary | ICD-10-CM | POA: Diagnosis not present

## 2020-05-20 DIAGNOSIS — E1122 Type 2 diabetes mellitus with diabetic chronic kidney disease: Secondary | ICD-10-CM | POA: Diagnosis present

## 2020-05-20 DIAGNOSIS — L89890 Pressure ulcer of other site, unstageable: Secondary | ICD-10-CM | POA: Diagnosis not present

## 2020-05-20 DIAGNOSIS — I130 Hypertensive heart and chronic kidney disease with heart failure and stage 1 through stage 4 chronic kidney disease, or unspecified chronic kidney disease: Secondary | ICD-10-CM | POA: Diagnosis present

## 2020-05-20 DIAGNOSIS — Z20822 Contact with and (suspected) exposure to covid-19: Secondary | ICD-10-CM | POA: Diagnosis present

## 2020-05-20 DIAGNOSIS — Z9119 Patient's noncompliance with other medical treatment and regimen: Secondary | ICD-10-CM

## 2020-05-20 DIAGNOSIS — Z8614 Personal history of Methicillin resistant Staphylococcus aureus infection: Secondary | ICD-10-CM

## 2020-05-20 DIAGNOSIS — S90424A Blister (nonthermal), right lesser toe(s), initial encounter: Secondary | ICD-10-CM | POA: Diagnosis not present

## 2020-05-20 DIAGNOSIS — I5021 Acute systolic (congestive) heart failure: Secondary | ICD-10-CM | POA: Diagnosis not present

## 2020-05-20 DIAGNOSIS — E119 Type 2 diabetes mellitus without complications: Secondary | ICD-10-CM | POA: Diagnosis not present

## 2020-05-20 DIAGNOSIS — Z955 Presence of coronary angioplasty implant and graft: Secondary | ICD-10-CM

## 2020-05-20 DIAGNOSIS — I361 Nonrheumatic tricuspid (valve) insufficiency: Secondary | ICD-10-CM | POA: Diagnosis not present

## 2020-05-20 DIAGNOSIS — M109 Gout, unspecified: Secondary | ICD-10-CM | POA: Diagnosis present

## 2020-05-20 DIAGNOSIS — I509 Heart failure, unspecified: Secondary | ICD-10-CM

## 2020-05-20 DIAGNOSIS — Z8674 Personal history of sudden cardiac arrest: Secondary | ICD-10-CM

## 2020-05-20 DIAGNOSIS — Z87891 Personal history of nicotine dependence: Secondary | ICD-10-CM

## 2020-05-20 DIAGNOSIS — J9621 Acute and chronic respiratory failure with hypoxia: Secondary | ICD-10-CM | POA: Diagnosis present

## 2020-05-20 DIAGNOSIS — J939 Pneumothorax, unspecified: Secondary | ICD-10-CM

## 2020-05-20 DIAGNOSIS — I50810 Right heart failure, unspecified: Secondary | ICD-10-CM | POA: Diagnosis not present

## 2020-05-20 DIAGNOSIS — I5033 Acute on chronic diastolic (congestive) heart failure: Secondary | ICD-10-CM | POA: Diagnosis not present

## 2020-05-20 DIAGNOSIS — Z833 Family history of diabetes mellitus: Secondary | ICD-10-CM

## 2020-05-20 DIAGNOSIS — I4891 Unspecified atrial fibrillation: Secondary | ICD-10-CM | POA: Diagnosis not present

## 2020-05-20 DIAGNOSIS — Z86718 Personal history of other venous thrombosis and embolism: Secondary | ICD-10-CM

## 2020-05-20 DIAGNOSIS — I5023 Acute on chronic systolic (congestive) heart failure: Secondary | ICD-10-CM | POA: Diagnosis not present

## 2020-05-20 DIAGNOSIS — J9612 Chronic respiratory failure with hypercapnia: Secondary | ICD-10-CM | POA: Diagnosis not present

## 2020-05-20 DIAGNOSIS — J9601 Acute respiratory failure with hypoxia: Secondary | ICD-10-CM | POA: Diagnosis not present

## 2020-05-20 DIAGNOSIS — T50915A Adverse effect of multiple unspecified drugs, medicaments and biological substances, initial encounter: Secondary | ICD-10-CM | POA: Diagnosis not present

## 2020-05-20 DIAGNOSIS — Z8249 Family history of ischemic heart disease and other diseases of the circulatory system: Secondary | ICD-10-CM

## 2020-05-20 DIAGNOSIS — Z9981 Dependence on supplemental oxygen: Secondary | ICD-10-CM

## 2020-05-20 LAB — HEMOGLOBIN A1C
Hgb A1c MFr Bld: 5.6 % (ref 4.8–5.6)
Mean Plasma Glucose: 114.02 mg/dL

## 2020-05-20 LAB — COMPREHENSIVE METABOLIC PANEL
ALT: 13 U/L (ref 0–44)
AST: 20 U/L (ref 15–41)
Albumin: 3.4 g/dL — ABNORMAL LOW (ref 3.5–5.0)
Alkaline Phosphatase: 81 U/L (ref 38–126)
Anion gap: 14 (ref 5–15)
BUN: 42 mg/dL — ABNORMAL HIGH (ref 8–23)
CO2: 36 mmol/L — ABNORMAL HIGH (ref 22–32)
Calcium: 9.2 mg/dL (ref 8.9–10.3)
Chloride: 82 mmol/L — ABNORMAL LOW (ref 98–111)
Creatinine, Ser: 1.9 mg/dL — ABNORMAL HIGH (ref 0.61–1.24)
GFR, Estimated: 35 mL/min — ABNORMAL LOW (ref >60)
Glucose, Bld: 137 mg/dL — ABNORMAL HIGH (ref 70–99)
Potassium: 3.7 mmol/L (ref 3.5–5.1)
Sodium: 132 mmol/L — ABNORMAL LOW (ref 135–145)
Total Bilirubin: 0.7 mg/dL (ref 0.3–1.2)
Total Protein: 7.7 g/dL (ref 6.5–8.1)

## 2020-05-20 LAB — RESP PANEL BY RT-PCR (FLU A&B, COVID) ARPGX2
Influenza A by PCR: NEGATIVE
Influenza B by PCR: NEGATIVE
SARS Coronavirus 2 by RT PCR: NEGATIVE

## 2020-05-20 LAB — CBC WITH DIFFERENTIAL/PLATELET
Abs Immature Granulocytes: 0.02 10*3/uL (ref 0.00–0.07)
Basophils Absolute: 0 10*3/uL (ref 0.0–0.1)
Basophils Relative: 1 %
Eosinophils Absolute: 0.1 10*3/uL (ref 0.0–0.5)
Eosinophils Relative: 2 %
HCT: 36.4 % — ABNORMAL LOW (ref 39.0–52.0)
Hemoglobin: 10.7 g/dL — ABNORMAL LOW (ref 13.0–17.0)
Immature Granulocytes: 0 %
Lymphocytes Relative: 10 %
Lymphs Abs: 0.8 10*3/uL (ref 0.7–4.0)
MCH: 30.7 pg (ref 26.0–34.0)
MCHC: 29.4 g/dL — ABNORMAL LOW (ref 30.0–36.0)
MCV: 104.3 fL — ABNORMAL HIGH (ref 80.0–100.0)
Monocytes Absolute: 0.5 10*3/uL (ref 0.1–1.0)
Monocytes Relative: 6 %
Neutro Abs: 6.5 10*3/uL (ref 1.7–7.7)
Neutrophils Relative %: 81 %
Platelets: 228 10*3/uL (ref 150–400)
RBC: 3.49 MIL/uL — ABNORMAL LOW (ref 4.22–5.81)
RDW: 16.1 % — ABNORMAL HIGH (ref 11.5–15.5)
WBC: 8 10*3/uL (ref 4.0–10.5)
nRBC: 0 % (ref 0.0–0.2)

## 2020-05-20 LAB — PROTIME-INR
INR: 1.4 — ABNORMAL HIGH (ref 0.8–1.2)
Prothrombin Time: 16.4 seconds — ABNORMAL HIGH (ref 11.4–15.2)

## 2020-05-20 LAB — APTT: aPTT: 36 seconds (ref 24–36)

## 2020-05-20 LAB — POC SARS CORONAVIRUS 2 AG -  ED: SARS Coronavirus 2 Ag: NEGATIVE

## 2020-05-20 LAB — BRAIN NATRIURETIC PEPTIDE: B Natriuretic Peptide: 618.9 pg/mL — ABNORMAL HIGH (ref 0.0–100.0)

## 2020-05-20 MED ORDER — ENOXAPARIN SODIUM 40 MG/0.4ML ~~LOC~~ SOLN
40.0000 mg | SUBCUTANEOUS | Status: DC
  Filled 2020-05-20: qty 0.4

## 2020-05-20 MED ORDER — FUROSEMIDE 10 MG/ML IJ SOLN
40.0000 mg | Freq: Once | INTRAMUSCULAR | Status: AC
  Administered 2020-05-20: 40 mg via INTRAVENOUS
  Filled 2020-05-20: qty 4

## 2020-05-20 MED ORDER — POTASSIUM CHLORIDE CRYS ER 10 MEQ PO TBCR
10.0000 meq | EXTENDED_RELEASE_TABLET | Freq: Every day | ORAL | Status: DC
  Administered 2020-05-23 – 2020-05-25 (×3): 10 meq via ORAL
  Filled 2020-05-20 (×5): qty 1

## 2020-05-20 MED ORDER — ALLOPURINOL 100 MG PO TABS
100.0000 mg | ORAL_TABLET | Freq: Two times a day (BID) | ORAL | Status: DC
  Administered 2020-05-21 – 2020-06-10 (×38): 100 mg via ORAL
  Filled 2020-05-20 (×43): qty 1

## 2020-05-20 MED ORDER — HYDRALAZINE HCL 50 MG PO TABS
100.0000 mg | ORAL_TABLET | Freq: Two times a day (BID) | ORAL | Status: DC
  Administered 2020-05-21 – 2020-05-30 (×16): 100 mg via ORAL
  Filled 2020-05-20 (×22): qty 2

## 2020-05-20 MED ORDER — GABAPENTIN 300 MG PO CAPS: 300.0000 mg | ORAL_CAPSULE | ORAL | Status: DC

## 2020-05-20 MED ORDER — WARFARIN SODIUM 4 MG PO TABS
8.0000 mg | ORAL_TABLET | Freq: Once | ORAL | Status: DC
  Filled 2020-05-20: qty 2

## 2020-05-20 MED ORDER — SODIUM CHLORIDE 0.9% FLUSH: 3.0000 mL | INTRAVENOUS | Status: DC | PRN

## 2020-05-20 MED ORDER — FERROUS SULFATE 325 (65 FE) MG PO TABS
325.0000 mg | ORAL_TABLET | Freq: Every day | ORAL | Status: DC
  Administered 2020-05-21 – 2020-06-10 (×20): 325 mg via ORAL
  Filled 2020-05-20 (×21): qty 1

## 2020-05-20 MED ORDER — SODIUM CHLORIDE 0.9% FLUSH
3.0000 mL | Freq: Two times a day (BID) | INTRAVENOUS | Status: DC
  Administered 2020-05-21 – 2020-06-01 (×24): 3 mL via INTRAVENOUS

## 2020-05-20 MED ORDER — GABAPENTIN 300 MG PO CAPS
300.0000 mg | ORAL_CAPSULE | Freq: Three times a day (TID) | ORAL | Status: DC
  Administered 2020-05-21 – 2020-05-27 (×18): 300 mg via ORAL
  Filled 2020-05-20 (×20): qty 1

## 2020-05-20 MED ORDER — INSULIN ASPART 100 UNIT/ML ~~LOC~~ SOLN
0.0000 [IU] | Freq: Three times a day (TID) | SUBCUTANEOUS | Status: DC
  Administered 2020-05-21: 3 [IU] via SUBCUTANEOUS
  Administered 2020-05-22: 2 [IU] via SUBCUTANEOUS
  Administered 2020-05-23 (×2): 3 [IU] via SUBCUTANEOUS
  Administered 2020-05-24: 2 [IU] via SUBCUTANEOUS
  Administered 2020-05-24 – 2020-05-25 (×3): 3 [IU] via SUBCUTANEOUS
  Administered 2020-05-25 – 2020-05-26 (×3): 5 [IU] via SUBCUTANEOUS
  Administered 2020-05-26: 2 [IU] via SUBCUTANEOUS
  Administered 2020-05-26: 3 [IU] via SUBCUTANEOUS
  Administered 2020-05-27: 5 [IU] via SUBCUTANEOUS
  Administered 2020-05-27: 3 [IU] via SUBCUTANEOUS
  Administered 2020-05-28: 5 [IU] via SUBCUTANEOUS
  Administered 2020-05-28: 3 [IU] via SUBCUTANEOUS
  Administered 2020-05-29: 2 [IU] via SUBCUTANEOUS
  Administered 2020-05-29 – 2020-05-30 (×3): 3 [IU] via SUBCUTANEOUS
  Administered 2020-05-30: 2 [IU] via SUBCUTANEOUS
  Administered 2020-05-30 – 2020-05-31 (×2): 3 [IU] via SUBCUTANEOUS
  Administered 2020-05-31 – 2020-06-01 (×3): 2 [IU] via SUBCUTANEOUS
  Administered 2020-06-02: 3 [IU] via SUBCUTANEOUS
  Administered 2020-06-02: 5 [IU] via SUBCUTANEOUS
  Administered 2020-06-03: 3 [IU] via SUBCUTANEOUS
  Administered 2020-06-03 (×2): 2 [IU] via SUBCUTANEOUS
  Administered 2020-06-04: 3 [IU] via SUBCUTANEOUS
  Administered 2020-06-04 (×2): 2 [IU] via SUBCUTANEOUS
  Administered 2020-06-05: 3 [IU] via SUBCUTANEOUS
  Administered 2020-06-06: 5 [IU] via SUBCUTANEOUS
  Administered 2020-06-06: 2 [IU] via SUBCUTANEOUS
  Administered 2020-06-06 – 2020-06-07 (×2): 3 [IU] via SUBCUTANEOUS
  Administered 2020-06-07: 5 [IU] via SUBCUTANEOUS
  Administered 2020-06-07: 2 [IU] via SUBCUTANEOUS
  Administered 2020-06-08: 3 [IU] via SUBCUTANEOUS
  Administered 2020-06-08: 2 [IU] via SUBCUTANEOUS
  Administered 2020-06-08: 5 [IU] via SUBCUTANEOUS
  Administered 2020-06-09: 2 [IU] via SUBCUTANEOUS
  Administered 2020-06-09 – 2020-06-10 (×2): 3 [IU] via SUBCUTANEOUS
  Administered 2020-06-10: 5 [IU] via SUBCUTANEOUS

## 2020-05-20 MED ORDER — QUETIAPINE FUMARATE 100 MG PO TABS
100.0000 mg | ORAL_TABLET | Freq: Every day | ORAL | Status: DC
  Administered 2020-05-21 (×2): 100 mg via ORAL
  Filled 2020-05-20 (×2): qty 2
  Filled 2020-05-20 (×2): qty 1

## 2020-05-20 MED ORDER — ONDANSETRON HCL 4 MG/2ML IJ SOLN: 4.0000 mg | Freq: Four times a day (QID) | INTRAMUSCULAR | Status: DC | PRN

## 2020-05-20 MED ORDER — ACETAMINOPHEN 325 MG PO TABS
650.0000 mg | ORAL_TABLET | ORAL | Status: DC | PRN
  Administered 2020-05-26: 650 mg via ORAL
  Filled 2020-05-20: qty 2

## 2020-05-20 MED ORDER — PRAVASTATIN SODIUM 40 MG PO TABS
40.0000 mg | ORAL_TABLET | Freq: Every day | ORAL | Status: DC
  Administered 2020-05-21 – 2020-06-09 (×19): 40 mg via ORAL
  Filled 2020-05-20 (×20): qty 1

## 2020-05-20 MED ORDER — PANTOPRAZOLE SODIUM 40 MG PO TBEC
40.0000 mg | DELAYED_RELEASE_TABLET | Freq: Two times a day (BID) | ORAL | Status: DC
  Administered 2020-05-21 – 2020-06-10 (×39): 40 mg via ORAL
  Filled 2020-05-20 (×40): qty 1

## 2020-05-20 MED ORDER — SODIUM CHLORIDE 0.9 % IV SOLN: 250.0000 mL | INTRAVENOUS | Status: DC | PRN

## 2020-05-20 MED ORDER — WARFARIN - PHARMACIST DOSING INPATIENT: Freq: Every day | Status: DC

## 2020-05-20 MED ORDER — ISOSORBIDE MONONITRATE ER 30 MG PO TB24
60.0000 mg | ORAL_TABLET | Freq: Every day | ORAL | Status: DC
  Administered 2020-05-23 – 2020-05-30 (×8): 60 mg via ORAL
  Filled 2020-05-20 (×2): qty 2
  Filled 2020-05-20: qty 1
  Filled 2020-05-20 (×7): qty 2

## 2020-05-20 NOTE — H&P (Addendum)
Date: 05/20/2020               Patient Name:  Isaiah [name] MRN: [id number]  DOB: [birth year] Age / Sex: 80 y.o., male   PCP: [name], [name], MD         Medical Service: Internal Medicine Teaching Service         Attending Physician: Dr. Velna Ochs, MD    First Contact: Dr. Bridgett Larsson Pager: [phone]  Second Contact: Dr. Laural Golden Pager: [phone]       After Hours (After 5p/  First Contact Pager: [phone]  weekends / holidays): Second Contact Pager: [phone]   Chief Complaint: Shortness of breath  History of Present Illness: Isaiah [name] is a 81 y.o male with a PMHx of type II DM, prior CVA, atrial fibrillation on coumadin, OSA, HLD, HTN, CAD, CKD stage III and HFpEF presenting with shortness of breath. His home health nurse came today and found to have low oxygen saturations. He also endorses bilateral LE swelling. Reports compliance with his medications. Denies increased salt intake, although son reports he has been eating bacon. He denies any abdominal pain, n/v/d, headaches, changes in his vision, or difficulty urinating.   Son was also at the bedside and able to provide some more history. States he lives with his daughter who helps the patient with his medications and around the house, completing ADLs. Son also reported that the patient's daughter recently had a car accident in which she broke her back and so she has not been able to assist Isaiah [name] as much. At baseline, the patient is able to eat on his own, take his medications, and use the bathroom. He is able to get up but needs assistance in getting around. His son reports after his most recent hospitalization he was doing much better and able to walk on his own and take a few steps but has decompensated some.  Of note he was recently hospitalized for a percutaneous cholecystostomy tube placement in November 2021.  Also he is on home supplemental oxygen, typically ~2-3L.    Meds:  Current Meds  Medication Sig  .  acetaminophen (TYLENOL) 500 MG tablet Take 500-1,000 mg by mouth every 6 (six) hours as needed for mild pain or headache.  . allopurinol (ZYLOPRIM) 100 MG tablet TAKE 2 TABLETS BY MOUTH DAILY. (Patient taking differently: Take 100 mg by mouth 2 (two) times daily.)  . colchicine 0.6 MG tablet Take 0.6 mg by mouth daily as needed (gout attacks).  . ferrous sulfate 325 (65 FE) MG tablet Take 1 tablet (325 mg total) by mouth 2 (two) times daily with a meal. (Patient taking differently: Take 325 mg by mouth daily with breakfast.)  . furosemide (LASIX) 40 MG tablet 40 mg daily except for Monday/Wednesday/ and Friday take 40 mg TWICE A DAY. (Patient taking differently: Take 40 mg by mouth in the morning, at noon, and at bedtime.)  . gabapentin (NEURONTIN) 300 MG capsule Take 300-600 mg by mouth See admin instructions. Take 300 mg by mouth three times a day and 600 mg at bedtime  . glipiZIDE (GLUCOTROL) 5 MG tablet Take 5 mg by mouth 2 (two) times daily before a meal.  . hydrALAZINE (APRESOLINE) 100 MG tablet Take 100 mg by mouth 2 (two) times daily.  . insulin aspart protamine- aspart (NOVOLOG MIX 70/30) (70-30) 100 UNIT/ML injection Inject 0.5 mLs (50 Units total) into the skin 2 (two) times daily before a meal.  . isosorbide  mononitrate (IMDUR) 60 MG 24 hr tablet Take 1 tablet (60 mg total) by mouth daily. Appointment needed for future refills (Patient taking differently: Take 60 mg by mouth daily.)  . metolazone (ZAROXOLYN) 2.5 MG tablet Take 2.5 mg by mouth 3 (three) times a week. Monday, Wednesday, Friday  . Multiple Vitamin (MULTIVITAMIN WITH MINERALS) TABS tablet Take 1 tablet by mouth daily.  . nitroGLYCERIN (NITROSTAT) 0.4 MG SL tablet Place 0.4 mg under the tongue every 5 (five) minutes as needed for chest pain.  . pantoprazole (PROTONIX) 40 MG tablet Take 40 mg by mouth 2 (two) times daily before a meal.   . potassium chloride (K-DUR) 10 MEQ tablet Take 1 tablet (10 mEq total) by mouth daily.  .  pravastatin (PRAVACHOL) 40 MG tablet Take 40 mg by mouth at bedtime.   Marland Kitchen PRESCRIPTION MEDICATION Inhale into the lungs See admin instructions. CPAP- At bedtime and during any time of rest  . QUEtiapine (SEROQUEL) 100 MG tablet Take 100 mg by mouth at bedtime.  . sodium chloride flush (NS) 0.9 % SOLN Instill 5 mL daily into percutaneous drain daily x 8 weeks.  Marland Kitchen warfarin (COUMADIN) 1 MG tablet Take 1 mg by mouth daily. Taking with the 5mg  = 6mg  daily  . warfarin (COUMADIN) 5 MG tablet Take 5 mg by mouth daily with supper. Taking with the 1mg  = 6mg  daily, unless directed differently by clinic     Allergies: Allergies as of 05/20/2020  . (No Known Allergies)   Past Medical History:  Diagnosis Date  . Atrial fibrillation (HCC)    on Coumadin  . CAD (coronary artery disease)    s/p remote CABG, stent in 2019  . CHF (congestive heart failure) (HCC)   . Dementia (HCC)   . Diabetes mellitus   . DVT (deep venous thrombosis) (HCC)   . Fall at home 10/2015  . Hyperlipidemia   . Hypertension   . Left-sided carotid artery disease (HCC)   . MRSA infection   . Myocardial infarction (HCC)   . Obesity   . Peripheral vascular disease (HCC)   . Sleep apnea   . Stroke (HCC)   . Stroke (HCC)   . Venous insufficiency     Family History:  Family History  Problem Relation Age of Onset  . Other Mother        varicose veins  . Heart disease Mother   . Hyperlipidemia Mother   . Hypertension Mother   . Varicose Veins Mother   . Cancer Father   . Diabetes Father   . Heart disease Father        before age 38  . Hyperlipidemia Father   . Hypertension Father   . Other Father        varicose veins  . Heart attack Father   . Diabetes Daughter   . Hyperlipidemia Daughter   . Hypertension Daughter   . Other Daughter        varicose veins  . Varicose Veins Daughter   . Hypertension Son   . Diabetes Sister   . Heart disease Sister        DVT  . Other Sister        varicose veins  .  Hyperlipidemia Sister   . Hypertension Sister   . Varicose Veins Sister   . Other Brother        varicose veins  . Hyperlipidemia Brother   . Hypertension Brother   . Heart attack Brother  Social History: Denies any history of tobacco, alcohol or illicit drug use. Lives at home with his daughter. He is able to complete some ADL's on his own but requires assistance in getting around.  Review of Systems: A complete ROS was negative except as per HPI.   Physical Exam: Blood pressure (!) 159/81, pulse 83, temperature (!) 97.5 F (36.4 C), temperature source Oral, resp. rate (!) 24, height 5\' 9"  (1.753 m), weight 98.4 kg, SpO2 91 %.  Physical Exam Vitals reviewed.  Constitutional:      General: He is not in acute distress.    Appearance: He is well-developed.  Cardiovascular:     Rate and Rhythm: Normal rate. Rhythm irregular.     Heart sounds: Normal heart sounds. No murmur heard.   Pulmonary:     Effort: Pulmonary effort is normal.     Breath sounds: Examination of the left-upper field reveals wheezing. Examination of the left-middle field reveals wheezing. Examination of the right-lower field reveals decreased breath sounds. Examination of the left-lower field reveals decreased breath sounds and wheezing. Decreased breath sounds and wheezing present.     Comments: Bilateral midlung crackles Abdominal:     General: Bowel sounds are normal.     Palpations: Abdomen is soft.     Tenderness: There is no abdominal tenderness. There is no guarding.     Comments: distended  Musculoskeletal:     Right lower leg: No tenderness. Edema present.     Left lower leg: No tenderness. Edema present.     Comments: Bilateral LE 2+ pitting edema  Skin:    General: Skin is warm and dry.  Neurological:     General: No focal deficit present.     Mental Status: He is alert and oriented to person, place, and time.  Psychiatric:        Mood and Affect: Mood normal. Mood is not anxious.         Behavior: Behavior normal. Behavior is not agitated.    EKG: personally reviewed my interpretation is atrial fibrillation.  CXR: personally reviewed my interpretation is bilateral pulmonary congestion.  Assessment & Plan by Problem: Active Problems:   Acute exacerbation of congestive heart failure [facility])  Isaiah [name] is a 80 year old male with a past medical history of HFrEF, CAD, type 2 diabetes, atrial fibrillation on Coumadin, hypertension and gout presenting with shortness of breath.  HFrEF Patient presents with worsening shortness of breath and bilateral lower extremity edema, significantly volume up on exam.  BNP 618. Chest xray demonstrates pulmonary edema. May be in the setting of dietary indiscretion. Last echo 03/2020 showed severe biventricular dysfunction with at least mild to moderate secondary MR and TR, EF 20%, left ventricular has severely decreased function and demonstrates global hypokinesis, right ventricular systolic function severely reduced.  Given 1 dose of IV furosemide 40 mg in the ED.  Will likely need aggressive diuresis.  Unknown dry weight, 98.5 kg during his last hospital admission.  Currently 98.4 kg. Requiring 8-9L of supplemental oxygen.  - Continue IV furosemide 40 mg - Strict ins and outs - Daily weights - Trend BMP  AKI Creatinine 1.9, baseline 1.2.  BUN 42, likely prerenal.  Will continue to monitor in the setting of diuresis.  Avoid nephrotoxic medications. - Trend BMP  Hyponatremia Na 132, likely dilutional, will continue to monitor.  - Trend BMP  Type II DM Blood glucose 137 on admission.  Home medications include glipizide 5 mg BID, novolog mix 70/30 50 units  twice daily and gabapentin 300 mg tid and an additional 600 mg nightly. Started lower dose of gabapentin given decreased GFR. - CBG monitoring  - SSI  HTN Home medications include hydralazine 100 mg bid, imdur 60 mg daily and furosemide 40 mg bid.  -Continue hydralazine and  Imdur  Atrial Fibrillation Currently rate controlled. On coumadin 6mg  qd.  Continue Coumadin per pharmacy.  HLD - Continue pravastatin 40 mg daily   Diet: Carb modified/heart healthy VTE ppx: Coumadin IVF: None Code: Full code  Dispo: Admit patient to Inpatient with expected length of stay greater than 2 midnights.  Signed , DO 05/20/2020, 7:09 PM  Pager3/11/2020 After 5pm on weekdays and 1pm on weekends: On Call pager: [phone]

## 2020-05-20 NOTE — ED Notes (Signed)
Assumed care on patient , respirations unlabored . IV sites intact , denies pain , patient waiting for in-patient telemetry bed assignment .

## 2020-05-20 NOTE — ED Triage Notes (Signed)
Patient BIB [facility] for shortness of breath. History of CHF, wears 4L O2 [place] at baseline, 85% on 4L, patient placed on NRB, SpO2 increased to 96%. Patient placed on 5L [place] on arrival to ED, maintaining SpO2 of 98%, no tachypnea, alert and oriented x4 speaking in complete sentences. Patient hard of hearing, arrives with hearing aid in left ear.

## 2020-05-20 NOTE — ED Provider Notes (Signed)
[facility] EMERGENCY DEPARTMENT Provider Note   CSN: [id number] Arrival date & time: 05/20/20  1119     History Chief Complaint  Patient presents with  . Shortness of Breath    Isaiah [name] is a 80 y.o. male.  HPI    80 y/o with hx of AF, DVT on coumadin and CAd, CHF with ED of 20% comes in with cc of dib.  Pt reports gradually worsening dib over the last week. He is normally on 4 liters of O2. Overnight and this morning his symptoms got worse. He was noted to be hypoxic on his home o2 per ems. Pt reports increased leg swelling. He denies chest pain, cough, covid exposure and he is vaccinated. No n/v/f/c. History also confirmed by patient's daughter.  Past Medical History:  Diagnosis Date  . Atrial fibrillation ([place])    on Coumadin  . CAD (coronary artery disease)    s/p remote CABG, stent in 2019  . CHF (congestive heart failure) ([place])   . Dementia ([place])   . Diabetes mellitus   . DVT (deep venous thrombosis) ([place])   . Fall at home 10/2015  . Hyperlipidemia   . Hypertension   . Left-sided carotid artery disease ([place])   . MRSA infection   . Myocardial infarction ([place])   . Obesity   . Peripheral vascular disease ([place])   . Sleep apnea   . Stroke ([place])   . Stroke ([place])   . Venous insufficiency     Patient Active Problem List   Diagnosis Date Noted  . Biventricular failure ([place])   . Anemia due to GI blood loss   . Acute systolic heart failure ([place])   . Shock ([place])   . Cardiac arrest ([place])   . Sepsis due to undetermined organism ([place]) 03/14/2020  . Acute cholecystitis 03/14/2020  . Class 1 obesity due to excess calories with body mass index (BMI) of 33.0 to 33.9 in adult 03/14/2020  . Pressure injury of skin 12/15/2018  . Thoracic spine fracture ([place]) 12/12/2018  . Hyponatremia 08/22/2016  . ARF (acute renal failure) ([place]) 08/21/2016  . Hypokalemia 08/11/2016  . Occult GI bleeding 08/11/2016  . General weakness 08/11/2016  . GI bleed  08/11/2016  . Weakness   . Non-ST elevation (NSTEMI) myocardial infarction ([place]) 08/08/2016  . Unstable angina ([place]) 08/06/2016  . Chest pain 08/05/2016  . CKD (chronic kidney disease), stage III ([place]) 08/05/2016  . Normochromic normocytic anemia 08/05/2016  . Pain in joint, lower leg 10/01/2014  . Coronary artery disease 04/27/2014  . Chronic atrial fibrillation ([place]) 04/27/2014  . Essential hypertension 04/27/2014  . Hyperlipidemia 04/27/2014  . Type 2 diabetes mellitus with vascular disease ([place]) 04/27/2014  . Obstructive sleep apnea 04/27/2014  . Stroke ([place]) 04/27/2014  . On continuous oral anticoagulation 04/27/2014  . Occlusion and stenosis of carotid artery without mention of cerebral infarction 02/04/2013  . Aftercare following surgery of the circulatory system, [place] 01/09/2012  . Chronic total occlusion of artery of the extremities ([place]) 01/09/2012    Past Surgical History:  Procedure Laterality Date  . CORONARY ARTERY BYPASS GRAFT  1997   vein harvest from right leg  . CORONARY STENT INTERVENTION N/A 08/07/2016   Procedure: Coronary Stent Intervention;  Surgeon: Jettie Booze, MD;  Location: [facility] CV LAB;  Service: Cardiovascular;  Laterality: N/A;  RCA  . ESOPHAGOGASTRODUODENOSCOPY N/A 08/12/2016   Procedure: ESOPHAGOGASTRODUODENOSCOPY (EGD);  Surgeon: Laurence Spates, MD;  Location: [facility];  Service:  Endoscopy;  Laterality: N/A;  . ESOPHAGOGASTRODUODENOSCOPY (EGD) WITH PROPOFOL Left 03/28/2020   Procedure: ESOPHAGOGASTRODUODENOSCOPY (EGD) WITH PROPOFOL;  Surgeon: Bernette Redbird, MD;  Location[facility] ENDOSCOPY;  Service: Endoscopy;  Laterality: Left;  . INGUINAL HERNIA REPAIR  1980's   Bilateral,  done in [facility]  . IR PERC CHOLECYSTOSTOMY  03/16/2020  . LEFT HEART CATH AND CORS/GRAFTS ANGIOGRAPHY N/A 08/07/2016   Procedure: Left Heart Cath and Cors/Grafts Angiography;  Surgeon: Corky Crafts, MD;  Location: [facility] INVASIVE CV LAB;  Service:  Cardiovascular;  Laterality: N/A;  . PR VEIN BYPASS GRAFT,AORTO-FEM-POP  1980   FEM-FEM BPG by [name]  . RADIOLOGY WITH ANESTHESIA N/A 03/15/2020   Procedure: IR WITH ANESTHESIA;  Surgeon: Irish Lack, MD;  Location[facility];  Service: Radiology;  Laterality: N/A;       Family History  Problem Relation Age of Onset  . Other Mother        varicose veins  . Heart disease Mother   . Hyperlipidemia Mother   . Hypertension Mother   . Varicose Veins Mother   . Cancer Father   . Diabetes Father   . Heart disease Father        before age 35  . Hyperlipidemia Father   . Hypertension Father   . Other Father        varicose veins  . Heart attack Father   . Diabetes Daughter   . Hyperlipidemia Daughter   . Hypertension Daughter   . Other Daughter        varicose veins  . Varicose Veins Daughter   . Hypertension Son   . Diabetes Sister   . Heart disease Sister        DVT  . Other Sister        varicose veins  . Hyperlipidemia Sister   . Hypertension Sister   . Varicose Veins Sister   . Other Brother        varicose veins  . Hyperlipidemia Brother   . Hypertension Brother   . Heart attack Brother     Social History   Tobacco Use  . Smoking status: Former Smoker    Types: Cigarettes    Quit date: 05/14/1978    Years since quitting: 42.0  . Smokeless tobacco: Never Used  Substance Use Topics  . Alcohol use: No  . Drug use: No    Home Medications Prior to Admission medications   Medication Sig Start Date End Date Taking? Authorizing Provider  acetaminophen (TYLENOL) 500 MG tablet Take 500-1,000 mg by mouth every 6 (six) hours as needed for mild pain or headache.    [provider]  allopurinol (ZYLOPRIM) 100 MG tablet TAKE 2 TABLETS BY MOUTH DAILY. Patient taking differently: Take 100 mg by mouth 2 (two) times daily.  05/03/14   Runell Gess, MD  colchicine 0.6 MG tablet Take 0.6 mg by mouth daily as needed (gout attacks).    [provider]   docusate sodium (COLACE) 100 MG capsule Take 1 capsule (100 mg total) by mouth 2 (two) times daily. Patient not taking: Reported on 03/14/2020 12/16/18   Jadene Pierini, MD  ferrous sulfate 325 (65 FE) MG tablet Take 1 tablet (325 mg total) by mouth 2 (two) times daily with a meal. Patient taking differently: Take 325 mg by mouth daily with breakfast.  08/09/16   Ghimire, Werner Lean, MD  furosemide (LASIX) 40 MG tablet 40 mg daily except for Monday/Wednesday/ and Friday take 40  mg TWICE A DAY. 04/05/20   Gertha Calkin, MD  gabapentin (NEURONTIN) 300 MG capsule Take 300-600 mg by mouth See admin instructions. Take 300 mg by mouth three times a day and 600 mg at bedtime    [provider]  glipiZIDE (GLUCOTROL) 5 MG tablet Take 5 mg by mouth 2 (two) times daily before a meal.    [provider]  hydrALAZINE (APRESOLINE) 100 MG tablet Take 100 mg by mouth 2 (two) times daily.    [provider]  insulin aspart protamine- aspart (NOVOLOG MIX 70/30) (70-30) 100 UNIT/ML injection Inject 0.5 mLs (50 Units total) into the skin 2 (two) times daily before a meal. 04/05/20 05/05/20  Gertha Calkin, MD  isosorbide mononitrate (IMDUR) 60 MG 24 hr tablet Take 1 tablet (60 mg total) by mouth daily. Appointment needed for future refills Patient taking differently: Take 60 mg by mouth daily.  05/03/15   Runell Gess, MD  metolazone (ZAROXOLYN) 2.5 MG tablet Take 2.5 mg by mouth daily as needed (AS DIRECTED FOR MARKED SWELLING).    [provider]  Multiple Vitamin (MULTIVITAMIN WITH MINERALS) TABS tablet Take 1 tablet by mouth daily.    [provider]  nitroGLYCERIN (NITROSTAT) 0.4 MG SL tablet Place 0.4 mg under the tongue every 5 (five) minutes as needed for chest pain.    [provider]  oxyCODONE (OXY IR/ROXICODONE) 5 MG immediate release tablet Take 1 tablet (5 mg total) by mouth every 4 (four) hours as needed (pain). Patient not taking: Reported on  03/14/2020 12/16/18   Jadene Pierini, MD  pantoprazole (PROTONIX) 40 MG tablet Take 40 mg by mouth 2 (two) times daily before a meal.     [provider]  potassium chloride (K-DUR) 10 MEQ tablet Take 1 tablet (10 mEq total) by mouth daily. 08/31/16   Barrett, Joline Salt, PA-C  pravastatin (PRAVACHOL) 40 MG tablet Take 40 mg by mouth at bedtime.     [provider]  PRESCRIPTION MEDICATION Inhale into the lungs See admin instructions. CPAP- At bedtime and during any time of rest    [provider]  Psyllium (METAMUCIL PO) Take 2 capsules by mouth at bedtime.    [provider]  QUEtiapine (SEROQUEL) 50 MG tablet Take 50 mg by mouth at bedtime.     [provider]  sodium chloride flush (NS) 0.9 % SOLN Instill 5 mL daily into percutaneous drain daily x 8 weeks. 04/05/20   Gertha Calkin, MD  warfarin (COUMADIN) 1 MG tablet Take 1 mg by mouth daily as needed (in conjunction with the 5 mg strength, AS DIRECTED- depending on INR).  12/18/19   [provider]  warfarin (COUMADIN) 5 MG tablet Take 5 mg by mouth daily with supper. 01/28/20   [provider]    Allergies    Patient has no known allergies.  Review of Systems   Review of Systems  Constitutional: Positive for activity change.  Respiratory: Positive for shortness of breath.   Cardiovascular: Negative for chest pain.  Gastrointestinal: Negative for nausea and vomiting.  Allergic/Immunologic: Negative for immunocompromised state.  Hematological: Bruises/bleeds easily.  All other systems reviewed and are negative.   Physical Exam Updated Vital Signs BP 112/74   Pulse 78   Temp (!) 97.5 F (36.4 C) (Oral)   Resp 20   Ht 5\' 9"  (1.753 m)   Wt 98.4 kg   SpO2 95%   BMI 32.04 kg/m  Physical Exam Vitals and nursing note reviewed.  Constitutional:      Appearance: He is well-developed.  HENT:     Head: Normocephalic and atraumatic.  Eyes:     Extraocular Movements: EOM  normal.     Conjunctiva/sclera: Conjunctivae normal.     Pupils: Pupils are equal, round, and reactive to light.  Cardiovascular:     Rate and Rhythm: Normal rate and regular rhythm.  Pulmonary:     Effort: Pulmonary effort is normal.     Breath sounds: Examination of the right-lower field reveals rales. Examination of the left-lower field reveals rales. Rales present.  Abdominal:     General: Bowel sounds are normal. There is no distension.     Palpations: Abdomen is soft. There is no mass.     Tenderness: There is no abdominal tenderness. There is no guarding or rebound.  Musculoskeletal:        General: No deformity.     Cervical back: Normal range of motion and neck supple.     Right lower leg: Edema present.     Left lower leg: Edema present.  Skin:    General: Skin is warm.  Neurological:     Mental Status: He is alert and oriented to person, place, and time.     ED Results / Procedures / Treatments   Labs (all labs ordered are listed, but only abnormal results are displayed) Labs Reviewed  CBC WITH DIFFERENTIAL/PLATELET - Abnormal; Notable for the following components:      Result Value   RBC 3.49 (*)    Hemoglobin 10.7 (*)    HCT 36.4 (*)    MCV 104.3 (*)    MCHC 29.4 (*)    RDW 16.1 (*)    All other components within normal limits  COMPREHENSIVE METABOLIC PANEL - Abnormal; Notable for the following components:   Sodium 132 (*)    Chloride 82 (*)    CO2 36 (*)    Glucose, Bld 137 (*)    BUN 42 (*)    Creatinine, Ser 1.90 (*)    Albumin 3.4 (*)    GFR, Estimated 35 (*)    All other components within normal limits  BRAIN NATRIURETIC PEPTIDE - Abnormal; Notable for the following components:   B Natriuretic Peptide 618.9 (*)    All other components within normal limits  PROTIME-INR - Abnormal; Notable for the following components:   Prothrombin Time 16.4 (*)    INR 1.4 (*)    All other components within normal limits  RESP PANEL BY RT-PCR (FLU A&B, COVID)  ARPGX2  APTT  POC SARS CORONAVIRUS 2 AG -  ED    EKG EKG Interpretation  Date/Time:  Friday May 20 2020 11:23:50 EST Ventricular Rate:  86 PR Interval:    QRS Duration: 119 QT Interval:  418 QTC Calculation: 500 R Axis:   85 Text Interpretation: Atrial fibrillation Ventricular premature complex Nonspecific intraventricular conduction delay Low voltage, extremity leads Borderline repolarization abnormality Confirmed by Virgina Norfolk [phone]) on 05/20/2020 3:05:43 PM   Radiology DG Chest 1 View  Result Date: 05/20/2020 CLINICAL DATA:  Shortness of breath EXAM: CHEST  1 VIEW COMPARISON:  03/31/2020 FINDINGS: Cardiac shadow is enlarged. Aortic calcifications are noted. Postsurgical changes are seen. Worsening vascular congestion is noted with bilateral effusions left greater than right. Patchy airspace opacity is noted in the bases bilaterally. No acute bony abnormality is seen. IMPRESSION: Worsening CHF with bilateral effusions left greater than right. Patchy airspace opacities in  the bases bilaterally. This may be related to the underlying CHF and edema although the possibility of multifocal pneumonia deserves consideration. Correlate with pending COVID testing. Electronically Signed   By: Alcide Clever M.D.   On: 05/20/2020 11:49    Procedures .Critical Care Performed by: Derwood Kaplan, MD Authorized by: Derwood Kaplan, MD   Critical care provider statement:    Critical care time (minutes):  36   Critical care was necessary to treat or prevent imminent or life-threatening deterioration of the following conditions:  Cardiac failure and respiratory failure   Critical care was time spent personally by me on the following activities:  Discussions with consultants, evaluation of patient's response to treatment, examination of patient, ordering and performing treatments and interventions, ordering and review of laboratory studies, ordering and review of radiographic studies, pulse  oximetry, re-evaluation of patient's condition, obtaining history from patient or surrogate and review of old charts   (including critical care time)  Medications Ordered in ED Medications  furosemide (LASIX) injection 40 mg (40 mg Intravenous Given 05/20/20 1251)    ED Course  I have reviewed the triage vital signs and the nursing notes.  Pertinent labs & imaging results that were available during my care of the patient were reviewed by me and considered in my medical decision making (see chart for details).    MDM Rules/Calculators/A&P                          Pt comes in with acute on chronic hypoxic respiratory failure. He was on NRB at arrival and tahcypneic. + JVD, bibasilar rales and pitting edema that is worsening. Clinically acute chf exacerbation. Not in AF with RVR. Pe considered, but AC lowers the risk and clinical suspicion is confirmed with CXR. Family updated.   PT given lasix. BP is WNL. Pt was weaned off of NRB and is on nasal canula 5 liters.  BEVERLEY SHERRARD was evaluated in Emergency Department on 05/20/2020 for the symptoms described in the history of present illness. He was evaluated in the context of the global COVID-19 pandemic, which necessitated consideration that the patient might be at risk for infection with the SARS-CoV-2 virus that causes COVID-19. Institutional protocols and algorithms that pertain to the evaluation of patients at risk for COVID-19 are in a state of rapid change based on information released by regulatory bodies including the CDC and federal and state organizations. These policies and algorithms were followed during the patient's care in the ED.   Final Clinical Impression(s) / ED Diagnoses Final diagnoses:  Acute systolic congestive heart failure (HCC)  AKI (acute kidney injury) (HCC)    Rx / DC Orders ED Discharge Orders    None       Derwood Kaplan, MD 05/20/20 1600

## 2020-05-20 NOTE — ED Notes (Signed)
Pt transferred to hospital bed for comfort.

## 2020-05-20 NOTE — Progress Notes (Signed)
ANTICOAGULATION CONSULT NOTE - Initial Consult  Pharmacy Consult for warfarin Indication: atrial fibrillation  No Known Allergies  Patient Measurements: Height: 5\' 9"  (175.3 cm) Weight: 98.4 kg (216 lb 14.9 oz) IBW/kg (Calculated) : 70.7  Vital Signs: Temp: 97.5 F (36.4 C) (01/07 1147) Temp Source: Oral (01/07 1147) BP: 159/81 (01/07 1800) Pulse Rate: 83 (01/07 1800)  Labs: Recent Labs    05/20/20 1132  HGB 10.7*  HCT 36.4*  PLT 228  APTT 36  LABPROT 16.4*  INR 1.4*  CREATININE 1.90*    Estimated Creatinine Clearance: 36.5 mL/min (A) (by C-G formula based on SCr of 1.9 mg/dL (H)).   Medical History: Past Medical History:  Diagnosis Date  . Atrial fibrillation (HCC)    on Coumadin  . CAD (coronary artery disease)    s/p remote CABG, stent in 2019  . CHF (congestive heart failure) (HCC)   . Dementia (HCC)   . Diabetes mellitus   . DVT (deep venous thrombosis) (HCC)   . Fall at home 10/2015  . Hyperlipidemia   . Hypertension   . Left-sided carotid artery disease (HCC)   . MRSA infection   . Myocardial infarction (HCC)   . Obesity   . Peripheral vascular disease (HCC)   . Sleep apnea   . Stroke (HCC)   . Stroke (HCC)   . Venous insufficiency     Medications:  (Not in a hospital admission)   Assessment: 80 year old male admitted for HF exacerbation. History of afib on warfarin, home dose is 6 mg daily.   INR on admission today is subtherapeutic at 1.4, patient reports last dose yesterday 1/6 at 1700. Hgb 10.7, platelets wnl.  Per chart review, during patient's last admission in Nov 2021 INR increased from 1.8 to 3 after ~5 mg daily dosing, unclear if patient has been therapeutic on pta dose since that hospitalization. Patient may also be a slower responder to warfarin based on history, will give slightly higher dose tonight.  Goal of Therapy:  INR 2-3 Monitor platelets by anticoagulation protocol: Yes   Plan:  Warfarin 8 mg x1 now Daily INR,  CBC Monitor for s/sx bleeding  Dec 2021, PharmD PGY-1 Pharmacy Resident 05/20/2020 7:59 PM Please see AMION for all pharmacy numbers

## 2020-05-21 ENCOUNTER — Other Ambulatory Visit (HOSPITAL_COMMUNITY): Payer: Medicare Other

## 2020-05-21 DIAGNOSIS — J9622 Acute and chronic respiratory failure with hypercapnia: Secondary | ICD-10-CM

## 2020-05-21 DIAGNOSIS — I10 Essential (primary) hypertension: Secondary | ICD-10-CM

## 2020-05-21 DIAGNOSIS — J9621 Acute and chronic respiratory failure with hypoxia: Secondary | ICD-10-CM

## 2020-05-21 DIAGNOSIS — E119 Type 2 diabetes mellitus without complications: Secondary | ICD-10-CM

## 2020-05-21 DIAGNOSIS — I4891 Unspecified atrial fibrillation: Secondary | ICD-10-CM

## 2020-05-21 LAB — I-STAT ARTERIAL BLOOD GAS, ED
Acid-Base Excess: 18 mmol/L — ABNORMAL HIGH (ref 0.0–2.0)
Bicarbonate: 47.5 mmol/L — ABNORMAL HIGH (ref 20.0–28.0)
Calcium, Ion: 1.21 mmol/L (ref 1.15–1.40)
HCT: 35 % — ABNORMAL LOW (ref 39.0–52.0)
Hemoglobin: 11.9 g/dL — ABNORMAL LOW (ref 13.0–17.0)
O2 Saturation: 98 %
Potassium: 3.7 mmol/L (ref 3.5–5.1)
Sodium: 131 mmol/L — ABNORMAL LOW (ref 135–145)
TCO2: >50 mmol/L — ABNORMAL HIGH (ref 22–32)
pCO2 arterial: 85.9 mmHg (ref 32.0–48.0)
pH, Arterial: 7.351 (ref 7.350–7.450)
pO2, Arterial: 128 mmHg — ABNORMAL HIGH (ref 83.0–108.0)

## 2020-05-21 LAB — BLOOD GAS, ARTERIAL
Acid-Base Excess: 16.7 mmol/L — ABNORMAL HIGH (ref 0.0–2.0)
Bicarbonate: 42.5 mmol/L — ABNORMAL HIGH (ref 20.0–28.0)
FIO2: 40
O2 Saturation: 93.4 %
Patient temperature: 37
pCO2 arterial: 70.1 mmHg (ref 32.0–48.0)
pH, Arterial: 7.399 (ref 7.350–7.450)
pO2, Arterial: 66.3 mmHg — ABNORMAL LOW (ref 83.0–108.0)

## 2020-05-21 LAB — CBC
HCT: 34.6 % — ABNORMAL LOW (ref 39.0–52.0)
Hemoglobin: 10.2 g/dL — ABNORMAL LOW (ref 13.0–17.0)
MCH: 30.7 pg (ref 26.0–34.0)
MCHC: 29.5 g/dL — ABNORMAL LOW (ref 30.0–36.0)
MCV: 104.2 fL — ABNORMAL HIGH (ref 80.0–100.0)
Platelets: 178 10*3/uL (ref 150–400)
RBC: 3.32 MIL/uL — ABNORMAL LOW (ref 4.22–5.81)
RDW: 15.9 % — ABNORMAL HIGH (ref 11.5–15.5)
WBC: 6.5 10*3/uL (ref 4.0–10.5)
nRBC: 0 % (ref 0.0–0.2)

## 2020-05-21 LAB — BASIC METABOLIC PANEL
Anion gap: 10 (ref 5–15)
BUN: 42 mg/dL — ABNORMAL HIGH (ref 8–23)
CO2: 40 mmol/L — ABNORMAL HIGH (ref 22–32)
Calcium: 9.3 mg/dL (ref 8.9–10.3)
Chloride: 82 mmol/L — ABNORMAL LOW (ref 98–111)
Creatinine, Ser: 1.7 mg/dL — ABNORMAL HIGH (ref 0.61–1.24)
GFR, Estimated: 41 mL/min — ABNORMAL LOW (ref >60)
Glucose, Bld: 132 mg/dL — ABNORMAL HIGH (ref 70–99)
Potassium: 3.6 mmol/L (ref 3.5–5.1)
Sodium: 132 mmol/L — ABNORMAL LOW (ref 135–145)

## 2020-05-21 LAB — GLUCOSE, CAPILLARY
Glucose-Capillary: 70 mg/dL (ref 70–99)
Glucose-Capillary: 86 mg/dL (ref 70–99)

## 2020-05-21 LAB — CBG MONITORING, ED
Glucose-Capillary: 163 mg/dL — ABNORMAL HIGH (ref 70–99)
Glucose-Capillary: 154 mg/dL — ABNORMAL HIGH (ref 70–99)

## 2020-05-21 LAB — PROTIME-INR
INR: 1.6 — ABNORMAL HIGH (ref 0.8–1.2)
Prothrombin Time: 18.7 seconds — ABNORMAL HIGH (ref 11.4–15.2)

## 2020-05-21 MED ORDER — FUROSEMIDE 10 MG/ML IJ SOLN
80.0000 mg | Freq: Once | INTRAMUSCULAR | Status: AC
  Administered 2020-05-21: 80 mg via INTRAVENOUS
  Filled 2020-05-21: qty 8

## 2020-05-21 MED ORDER — POTASSIUM CHLORIDE CRYS ER 20 MEQ PO TBCR
40.0000 meq | EXTENDED_RELEASE_TABLET | Freq: Once | ORAL | Status: AC
  Administered 2020-05-21: 40 meq via ORAL
  Filled 2020-05-21: qty 2

## 2020-05-21 MED ORDER — WARFARIN SODIUM 5 MG PO TABS
8.0000 mg | ORAL_TABLET | Freq: Once | ORAL | Status: AC
  Administered 2020-05-21: 8 mg via ORAL
  Filled 2020-05-21: qty 2
  Filled 2020-05-21: qty 1

## 2020-05-21 NOTE — Progress Notes (Signed)
   Subjective:   Patient reports he is feeling about the same this morning. He is not sure if he has been urinating. During evaluation he quickly desaturated to the 70s and was placed on NRB. He did state he was having difficulty breathing and wanted to be sat up because that helped. Discussed with him we want him to be on BiPAP to help his breathing and he expressed understanding.  Objective:  Vital signs in last 24 hours: Vitals:   05/21/20 0200 05/21/20 0215 05/21/20 0430 05/21/20 0515  BP: (!) 125/57 122/62 (!) 131/105 110/74  Pulse: 80 77 79 77  Resp: 15 17 (!) 22 17  Temp: 97.9 F (36.6 C)     TempSrc: Oral     SpO2: 99% 98% 97% 95%  Weight:      Height:       General- laying comfortably in bed initially, NAD, quickly desaturated on 10L after lung exam down to ~70-80% Lungs- difficult to fully assess due to poor inhalation/exhalation, some bilateral crackles appreciated, now on NRB  CV- irregular rhythm, regular rate, no murmurs Abdomen- non tender, bowel sounds present, non distended  Ext- bilateral LE 2+ pitting edema  Assessment/Plan:  Active Problems:   Acute exacerbation of congestive heart failure [name])  [name] is a 80 year old male with a past medical history of HFrEF, CAD, type 2 diabetes, atrial fibrillation on Coumadin, hypertension and gout presenting with shortness of breath.  Acute hypoxic and hypercapnic respiratory failure OSA Bicarb continues to trend up 40<36. He was initially saturating >88% on 10L but quickly desaturated to the 70s during evaluation today and placed on NRB. Ordered a stat ABG and placed on BiPAP. ABG showed pCO2 85, pO2 128, bicarb 47 and pH 7.35. Likely in the setting of OSA. Per chart review, he has a history of noncompliance to his CPAP. Will continue to use BiPAP. - Continue BiPAP - Wean as tolerated   Acute on chronic heart failure exacerbation Worsening SOB today and still volume up on exam. ~1600L output since  admission. Unclear dry weight, at last discharge it was 98.4 kg, which is stable. Per chart review, there were two echocardiograms completed during last hospital admission with differing reports; complete echo demonstrated biventricular dysfunction and EF 20%. Will repeat echo. Continue to diurese. Unknown dry weight, 98.5 kg during his last hospital admission.  - Continue IV furosemide 80 mg - Strict ins and outs - Daily weights - Trend BMP  AKI Creatinine 1.7<1.9, baseline 1.2.  BUN 40, likely pren.  Will continue to monitor in the setting of diuresis.   - Avoid nephrotoxic medications. - Trend BMP  Hyponatremia Na 132, likely dilutional, will continue to monitor.  - Trend BMP  Type II DM BG 132. Home medications include novolog 70/30 and glipizide.  - CBG monitoring  - SSI  HTN Home medications include hydralazine 100 mg bid, imdur 60 mg daily and furosemide 40 mg bid.  -Continue hydralazine and Imdur  Atrial Fibrillation Currently rate controlled. On coumadin 6mg  qd.  Continue Coumadin per pharmacy.   Diet: Carb modified/heart healthy VTE ppx: Coumadin IVF: None Code: Full code  Prior to Admission Living Arrangement: Home with daughter Anticipated Discharge Location: Home with daughter Barriers to Discharge: Clinical improvement  Dispo: Anticipated discharge pending clinical improvement.   , DO 05/21/2020, 6:46 AM Pager: [phone] After 5pm on weekdays and 1pm on weekends: On Call pager [phone]

## 2020-05-21 NOTE — ED Notes (Signed)
Tele; Breakfast Order placed 

## 2020-05-21 NOTE — Progress Notes (Addendum)
ANTICOAGULATION CONSULT NOTE  Pharmacy Consult for warfarin Indication: atrial fibrillation  No Known Allergies  Patient Measurements: Height: 5\' 9"  (175.3 cm) Weight: 98.4 kg (216 lb 14.9 oz) IBW/kg (Calculated) : 70.7  Vital Signs: Temp: 97.9 F (36.6 C) (01/08 0200) Temp Source: Oral (01/08 0200) BP: 112/67 (01/08 1045) Pulse Rate: 74 (01/08 1045)  Labs: Recent Labs    05/20/20 1132 05/21/20 0559 05/21/20 0908  HGB 10.7* 10.2* 11.9*  HCT 36.4* 34.6* 35.0*  PLT 228 178  --   APTT 36  --   --   LABPROT 16.4* 18.7*  --   INR 1.4* 1.6*  --   CREATININE 1.90* 1.70*  --     Estimated Creatinine Clearance: 40.8 mL/min (A) (by C-G formula based on SCr of 1.7 mg/dL (H)).   Medical History: Past Medical History:  Diagnosis Date  . Atrial fibrillation (HCC)    on Coumadin  . CAD (coronary artery disease)    s/p remote CABG, stent in 2019  . CHF (congestive heart failure) (HCC)   . Dementia (HCC)   . Diabetes mellitus   . DVT (deep venous thrombosis) (HCC)   . Fall at home 10/2015  . Hyperlipidemia   . Hypertension   . Left-sided carotid artery disease (HCC)   . MRSA infection   . Myocardial infarction (HCC)   . Obesity   . Peripheral vascular disease (HCC)   . Sleep apnea   . Stroke (HCC)   . Stroke (HCC)   . Venous insufficiency     Medications:  (Not in a hospital admission)   Assessment: 80 year old male admitted for HF exacerbation. History of afib on warfarin, home dose is 6 mg daily.   INR today is subtherapeutic 1.4>>1.6, warfarin dose ordered yesterday was not administered   Goal of Therapy:  INR 2-3 Monitor platelets by anticoagulation protocol: Yes   Plan:  Warfarin 8mg  PO x 1 today Daily INR, s/s bleeding  66, PharmD Clinical Pharmacist ED Pharmacist Phone # [phone] 05/21/2020 11:13 AM

## 2020-05-21 NOTE — ED Notes (Signed)
Patient CBG was 163. 

## 2020-05-21 NOTE — Progress Notes (Signed)
CRITICAL VALUE ALERT  Critical Value:  PCO2 70.1  Date & Time Notied:  05/21/2020 1853  Provider Notified: Internal medicine team  Orders Received/Actions taken: Continue Bipap

## 2020-05-21 NOTE — Progress Notes (Signed)
Placed patient back on the BiPAP due to desaturation on 15LHFNC, SATS improved on the BiPAP with increased settings.

## 2020-05-22 ENCOUNTER — Encounter (HOSPITAL_COMMUNITY): Payer: Self-pay | Admitting: Internal Medicine

## 2020-05-22 ENCOUNTER — Inpatient Hospital Stay (HOSPITAL_COMMUNITY): Payer: Medicare Other

## 2020-05-22 DIAGNOSIS — J9622 Acute and chronic respiratory failure with hypercapnia: Secondary | ICD-10-CM

## 2020-05-22 DIAGNOSIS — I5023 Acute on chronic systolic (congestive) heart failure: Secondary | ICD-10-CM

## 2020-05-22 DIAGNOSIS — J9621 Acute and chronic respiratory failure with hypoxia: Secondary | ICD-10-CM

## 2020-05-22 DIAGNOSIS — I361 Nonrheumatic tricuspid (valve) insufficiency: Secondary | ICD-10-CM | POA: Diagnosis not present

## 2020-05-22 LAB — BASIC METABOLIC PANEL
Anion gap: 11 (ref 5–15)
Anion gap: 13 (ref 5–15)
BUN: 42 mg/dL — ABNORMAL HIGH (ref 8–23)
BUN: 40 mg/dL — ABNORMAL HIGH (ref 8–23)
CO2: 40 mmol/L — ABNORMAL HIGH (ref 22–32)
CO2: 39 mmol/L — ABNORMAL HIGH (ref 22–32)
Calcium: 9.2 mg/dL (ref 8.9–10.3)
Calcium: 9.2 mg/dL (ref 8.9–10.3)
Chloride: 81 mmol/L — ABNORMAL LOW (ref 98–111)
Chloride: 82 mmol/L — ABNORMAL LOW (ref 98–111)
Creatinine, Ser: 1.61 mg/dL — ABNORMAL HIGH (ref 0.61–1.24)
Creatinine, Ser: 1.55 mg/dL — ABNORMAL HIGH (ref 0.61–1.24)
GFR, Estimated: 43 mL/min — ABNORMAL LOW (ref >60)
GFR, Estimated: 45 mL/min — ABNORMAL LOW (ref >60)
Glucose, Bld: 174 mg/dL — ABNORMAL HIGH (ref 70–99)
Glucose, Bld: 116 mg/dL — ABNORMAL HIGH (ref 70–99)
Potassium: 4.1 mmol/L (ref 3.5–5.1)
Potassium: 3.9 mmol/L (ref 3.5–5.1)
Sodium: 132 mmol/L — ABNORMAL LOW (ref 135–145)
Sodium: 134 mmol/L — ABNORMAL LOW (ref 135–145)

## 2020-05-22 LAB — BLOOD GAS, ARTERIAL
Acid-Base Excess: 17.3 mmol/L — ABNORMAL HIGH (ref 0.0–2.0)
Bicarbonate: 43.6 mmol/L — ABNORMAL HIGH (ref 20.0–28.0)
Drawn by: 24686
FIO2: 60
O2 Saturation: 80.4 %
Patient temperature: 37
pCO2 arterial: 78.3 mmHg (ref 32.0–48.0)
pH, Arterial: 7.364 (ref 7.350–7.450)
pO2, Arterial: 47.9 mmHg — ABNORMAL LOW (ref 83.0–108.0)

## 2020-05-22 LAB — GLUCOSE, CAPILLARY
Glucose-Capillary: 150 mg/dL — ABNORMAL HIGH (ref 70–99)
Glucose-Capillary: 118 mg/dL — ABNORMAL HIGH (ref 70–99)
Glucose-Capillary: 115 mg/dL — ABNORMAL HIGH (ref 70–99)
Glucose-Capillary: 173 mg/dL — ABNORMAL HIGH (ref 70–99)

## 2020-05-22 LAB — RESPIRATORY PANEL BY PCR

## 2020-05-22 LAB — ECHOCARDIOGRAM COMPLETE
AR max vel: 2.47 cm2
AV Area VTI: 2.44 cm2
AV Area mean vel: 2.48 cm2
AV Mean grad: 3 mmHg
AV Peak grad: 5.4 mmHg
Ao pk vel: 1.16 m/s
Area-P 1/2: 5.38 cm2
Height: 69 in
S' Lateral: 3.3 cm
Weight: 3897.73 oz

## 2020-05-22 LAB — TROPONIN I (HIGH SENSITIVITY)
Troponin I (High Sensitivity): 38 ng/L — ABNORMAL HIGH (ref <18)
Troponin I (High Sensitivity): 40 ng/L — ABNORMAL HIGH (ref <18)

## 2020-05-22 LAB — CBC
HCT: 32.7 % — ABNORMAL LOW (ref 39.0–52.0)
Hemoglobin: 9.9 g/dL — ABNORMAL LOW (ref 13.0–17.0)
MCH: 30.7 pg (ref 26.0–34.0)
MCHC: 30.3 g/dL (ref 30.0–36.0)
MCV: 101.2 fL — ABNORMAL HIGH (ref 80.0–100.0)
Platelets: 177 10*3/uL (ref 150–400)
RBC: 3.23 MIL/uL — ABNORMAL LOW (ref 4.22–5.81)
RDW: 15.9 % — ABNORMAL HIGH (ref 11.5–15.5)
WBC: 6.4 10*3/uL (ref 4.0–10.5)
nRBC: 0 % (ref 0.0–0.2)

## 2020-05-22 LAB — PROTIME-INR
INR: 1.8 — ABNORMAL HIGH (ref 0.8–1.2)
Prothrombin Time: 20.2 seconds — ABNORMAL HIGH (ref 11.4–15.2)

## 2020-05-22 LAB — MRSA PCR SCREENING: MRSA by PCR: NEGATIVE

## 2020-05-22 LAB — PROCALCITONIN: Procalcitonin: 0.13 ng/mL

## 2020-05-22 MED ORDER — FUROSEMIDE 10 MG/ML IJ SOLN
80.0000 mg | Freq: Every day | INTRAMUSCULAR | Status: DC
  Administered 2020-05-22 – 2020-05-23 (×2): 80 mg via INTRAVENOUS
  Filled 2020-05-22 (×2): qty 8

## 2020-05-22 MED ORDER — FUROSEMIDE 10 MG/ML IJ SOLN: 80.0000 mg | Freq: Once | INTRAMUSCULAR | Status: DC

## 2020-05-22 MED ORDER — PERFLUTREN LIPID MICROSPHERE
1.0000 mL | INTRAVENOUS | Status: AC | PRN
  Administered 2020-05-22: 3 mL via INTRAVENOUS
  Filled 2020-05-22: qty 10

## 2020-05-22 MED ORDER — FUROSEMIDE 10 MG/ML IJ SOLN
80.0000 mg | Freq: Once | INTRAMUSCULAR | Status: AC
  Administered 2020-05-22: 80 mg via INTRAVENOUS
  Filled 2020-05-22: qty 8

## 2020-05-22 MED ORDER — OLANZAPINE 5 MG PO TBDP
10.0000 mg | ORAL_TABLET | Freq: Every day | ORAL | Status: DC
  Administered 2020-05-22 – 2020-05-25 (×4): 10 mg via ORAL
  Filled 2020-05-22 (×4): qty 2

## 2020-05-22 MED ORDER — CHLORHEXIDINE GLUCONATE CLOTH 2 % EX PADS
6.0000 | MEDICATED_PAD | Freq: Every day | CUTANEOUS | Status: DC
  Administered 2020-05-22 – 2020-06-10 (×19): 6 via TOPICAL

## 2020-05-22 MED ORDER — SENNA 8.6 MG PO TABS
1.0000 | ORAL_TABLET | Freq: Every day | ORAL | Status: DC
  Administered 2020-05-23 – 2020-06-10 (×19): 8.6 mg via ORAL
  Filled 2020-05-22 (×21): qty 1

## 2020-05-22 MED ORDER — WARFARIN SODIUM 4 MG PO TABS
8.0000 mg | ORAL_TABLET | Freq: Once | ORAL | Status: AC
  Administered 2020-05-22: 8 mg via ORAL
  Filled 2020-05-22 (×2): qty 2

## 2020-05-22 MED ORDER — POLYETHYLENE GLYCOL 3350 17 G PO PACK
17.0000 g | PACK | Freq: Two times a day (BID) | ORAL | Status: DC
  Administered 2020-05-22 – 2020-06-07 (×20): 17 g via ORAL
  Filled 2020-05-22 (×25): qty 1

## 2020-05-22 NOTE — Progress Notes (Addendum)
Pt had unresponsive episode around 0918. Coded for respiratory arrest.   He was on 10L of HFNC with O2 sat upper 90s at that time of event.Marland Kitchen  He was on Bipap yesterday until around 2100, took meds around 2200.  Wore Bipap back on around 2300 per night shift.  He came off the bipap early this morning around 0600.  Noted his catheter was leaking.  He turned well with   He was noted to have external urinal catheter leakage, and was turning to change the pads.  He requested to be pulled up in bed, and when he became flat, he began to have apneic and became unresponsive. His O2 sat was in 50s, and dropped to 40s.  He was immediately bagged when in agonal breathing.  O2 sat became in 80s about 2 minutes, and he became responsive.  Hinton Dyer, RN

## 2020-05-22 NOTE — Progress Notes (Signed)
ANTICOAGULATION CONSULT NOTE  Pharmacy Consult for warfarin Indication: atrial fibrillation  No Known Allergies  Patient Measurements: Height: 5\' 9"  (175.3 cm) Weight: 110.5 kg (243 lb 9.7 oz) IBW/kg (Calculated) : 70.7  Vital Signs: Temp: 97.9 F (36.6 C) (01/09 1118) Temp Source: Oral (01/09 1200) BP: 109/65 (01/09 1300) Pulse Rate: 74 (01/09 1300)  Labs: Recent Labs    05/20/20 1132 05/21/20 0559 05/21/20 0908 05/22/20 0203  HGB 10.7* 10.2* 11.9* 9.9*  HCT 36.4* 34.6* 35.0* 32.7*  PLT 228 178  --  177  APTT 36  --   --   --   LABPROT 16.4* 18.7*  --  20.2*  INR 1.4* 1.6*  --  1.8*  CREATININE 1.90* 1.70*  --  1.61*    Estimated Creatinine Clearance: 45.6 mL/min (A) (by C-G formula based on SCr of 1.61 mg/dL (H)).   Medical History: Past Medical History:  Diagnosis Date  . Atrial fibrillation (HCC)    on Coumadin  . CAD (coronary artery disease)    s/p remote CABG, stent in 2019  . CHF (congestive heart failure) (HCC)   . Dementia (HCC)   . Diabetes mellitus   . DVT (deep venous thrombosis) (HCC)   . Fall at home 10/2015  . Hyperlipidemia   . Hypertension   . Left-sided carotid artery disease (HCC)   . MRSA infection   . Myocardial infarction (HCC)   . Obesity   . Peripheral vascular disease (HCC)   . Sleep apnea   . Stroke (HCC)   . Stroke (HCC)   . Venous insufficiency     Medications:  Medications Prior to Admission  Medication Sig Dispense Refill Last Dose  . acetaminophen (TYLENOL) 500 MG tablet Take 500-1,000 mg by mouth every 6 (six) hours as needed for mild pain or headache.   unk  . allopurinol (ZYLOPRIM) 100 MG tablet TAKE 2 TABLETS BY MOUTH DAILY. (Patient taking differently: Take 100 mg by mouth 2 (two) times daily.) 30 tablet 0 05/19/2020 at Unknown time  . colchicine 0.6 MG tablet Take 0.6 mg by mouth daily as needed (gout attacks).   unk  . ferrous sulfate 325 (65 FE) MG tablet Take 1 tablet (325 mg total) by mouth 2 (two) times  daily with a meal. (Patient taking differently: Take 325 mg by mouth daily with breakfast.) 90 tablet 0 05/19/2020 at Unknown time  . furosemide (LASIX) 40 MG tablet 40 mg daily except for Monday/Wednesday/ and Friday take 40 mg TWICE A DAY. (Patient taking differently: Take 40 mg by mouth in the morning, at noon, and at bedtime.)   05/19/2020 at Unknown time  . gabapentin (NEURONTIN) 300 MG capsule Take 300-600 mg by mouth See admin instructions. Take 300 mg by mouth three times a day and 600 mg at bedtime   05/19/2020 at Unknown time  . glipiZIDE (GLUCOTROL) 5 MG tablet Take 5 mg by mouth 2 (two) times daily before a meal.   05/19/2020 at Unknown time  . hydrALAZINE (APRESOLINE) 100 MG tablet Take 100 mg by mouth 2 (two) times daily.   05/19/2020 at Unknown time  . insulin aspart protamine- aspart (NOVOLOG MIX 70/30) (70-30) 100 UNIT/ML injection Inject 0.5 mLs (50 Units total) into the skin 2 (two) times daily before a meal. 30 mL 0 05/19/2020 at Unknown time  . isosorbide mononitrate (IMDUR) 60 MG 24 hr tablet Take 1 tablet (60 mg total) by mouth daily. Appointment needed for future refills (Patient taking differently: Take 60 mg  by mouth daily.) 30 tablet 0 05/19/2020 at Unknown time  . metolazone (ZAROXOLYN) 2.5 MG tablet Take 2.5 mg by mouth 3 (three) times a week. Monday, Wednesday, Friday   05/18/2020  . Multiple Vitamin (MULTIVITAMIN WITH MINERALS) TABS tablet Take 1 tablet by mouth daily.   05/19/2020 at Unknown time  . nitroGLYCERIN (NITROSTAT) 0.4 MG SL tablet Place 0.4 mg under the tongue every 5 (five) minutes as needed for chest pain.   unk  . pantoprazole (PROTONIX) 40 MG tablet Take 40 mg by mouth 2 (two) times daily before a meal.    05/19/2020 at Unknown time  . potassium chloride (K-DUR) 10 MEQ tablet Take 1 tablet (10 mEq total) by mouth daily. 30 tablet 5 05/19/2020 at Unknown time  . pravastatin (PRAVACHOL) 40 MG tablet Take 40 mg by mouth at bedtime.    05/19/2020 at Unknown time  . PRESCRIPTION  MEDICATION Inhale into the lungs See admin instructions. CPAP- At bedtime and during any time of rest     . QUEtiapine (SEROQUEL) 100 MG tablet Take 100 mg by mouth at bedtime.   05/19/2020 at Unknown time  . sodium chloride flush (NS) 0.9 % SOLN Instill 5 mL daily into percutaneous drain daily x 8 weeks. 56 Syringe 0   . warfarin (COUMADIN) 1 MG tablet Take 1 mg by mouth daily. Taking with the 5mg  = 6mg  daily   05/19/2020 at 1700  . warfarin (COUMADIN) 5 MG tablet Take 5 mg by mouth daily with supper. Taking with the 1mg  = 6mg  daily, unless directed differently by clinic   05/19/2020 at 1700  . docusate sodium (COLACE) 100 MG capsule Take 1 capsule (100 mg total) by mouth 2 (two) times daily. (Patient taking differently: Take 100 mg by mouth daily as needed.) 10 capsule 0 unk  . oxyCODONE (OXY IR/ROXICODONE) 5 MG immediate release tablet Take 1 tablet (5 mg total) by mouth every 4 (four) hours as needed (pain). (Patient not taking: No sig reported) 30 tablet 0 Not Taking at Unknown time    Assessment: 80 year old male admitted for HF exacerbation. History of afib on warfarin, home dose is 6 mg daily.   INR today is increased but still subtherapeutic 1.4>>1.8 (missed dose 1/7). Hg down to 9.9, plt wnl. No active bleed issues reported.  Noted brief respiratory arrest 1/9 AM, and patient transferred to ICU on BIPAP, currently NPO.  Goal of Therapy:  INR 2-3 Monitor platelets by anticoagulation protocol: Yes   Plan:  Warfarin 8mg  PO x 1 again tonight - may need to start heparin drip instead if remains NPO on bipap per CCM. Attempting to continue warfarin for now. Monitor daily INR, CBC, s/sx bleeding   07/17/2020, PharmD, BCPS Please check AMION for all [facility] contact numbers Clinical Pharmacist 05/22/2020 1:11 PM

## 2020-05-22 NOTE — Progress Notes (Signed)
RT placed pt on BIPAP V60 for the night. Pt respiratory status is stable at this time on BIPAP. Pt tolerating well. RT will continue to monitor.

## 2020-05-22 NOTE — Progress Notes (Signed)
  Echocardiogram 2D Echocardiogram has been performed.  Gerda Diss 05/22/2020, 12:37 PM

## 2020-05-22 NOTE — Code Documentation (Signed)
  Patient Name: Isaiah [name]   MRN: [id number]   Date of Birth/ Sex: 1941-01-05 , male      Admission Date: 05/20/2020  Attending Provider: Reymundo Poll, MD  Primary Diagnosis: <principal problem not specified>   Indication: Pt was in his usual state of health until this AM, when he was noted to desaturate to 50% while turning, then unresponsive and not breathing. Code blue was subsequently called. At the time of arrival on scene, ACLS protocol was underway.   Technical Description:  - CPR performance duration:  0 minute, never lost pulse  - Was defibrillation or cardioversion used? No   - Was external pacer placed? No  - Was patient intubated pre/post CPR? No, but was placed on BiPAP   Medications Administered: Y = Yes; Blank = No Amiodarone    Atropine    Calcium    Epinephrine    Lidocaine    Magnesium    Norepinephrine    Phenylephrine    Sodium bicarbonate    Vasopressin     After assisting respirations with bag valve mask, patient resumed spontaneous respirations and became alert and interactive.  Post CPR evaluation:  - Final Status - Was patient successfully resuscitated ? Yes - What is current rhythm? Normal sinus - What is current hemodynamic status? stable  Miscellaneous Information:  - Labs sent, including: none  - Primary team notified?  Yes  - Family Notified? Yes  - Additional notes/ transfer status: Transfer to ICU     Remo Lipps, MD  05/22/2020, 1:00 PM

## 2020-05-22 NOTE — Consult Note (Signed)
NAME:  Isaiah [name], MRN:  [id number], DOB:  19-Jul-1940, LOS: 2 ADMISSION DATE:  05/20/2020, CONSULTATION DATE:  05/22/2020 REFERRING MD:  Dr. Antony ContrasGuilloud, CHIEF COMPLAINT:  Respiratory arrest    Brief History:  80 year old male presented with progressive shortness of breath and hypoxia with bilateral lower extremity swelling on admission.  Admitted per teaching service for acute CHF exasperation and volume overload.  Morning of 05/22/2020 patient suffered brief respiratory arrest for which PCCM was consulted for further management  History of Present Illness:  Isaiah [name] is a 80 year old male with an extensive past medical history significant for diastolic congestive heart failure, type 2 diabetes, prior CVA, atrial fibrillation on chronic anticoagulation, obstructive sleep apnea, hyperlipidemia, hypertension, CAD, and CKD stage III who presented to the emergency department with progressive shortness of breath, hypoxia, and bilateral lower extremity edema.  Patient denied any increased salt or fluid intake and reported compliance with medications prior to admission.  Additional history includes recent hospitalization for percutaneous cholecystectomy tube placement November 2021  Patient was admitted per teaching service for acute CHF exasperation coupled with acute hypoxic and hypercapnic respiratory failure.  Patient was being aggressively diuresed with close monitoring of renal function.  Patient also has required intermittent BiPAP therapy for hypoxia and hypercapnia.    Morning of 05/22/2020 patient suffered brief respiratory arrest.  Per nursing staff patient was being assisted with pericare.  Patient was on supplemental nasal cannula oxygen when he was observed to become minimally responsive upon closer observation patient was found to have agonal respirations and becoming cyanotic.  At this time patient was bagged with good response and hypoxia and eventual return of spontaneous respiratory rate.   Patient was placed on BiPAP therapy with appropriate oxygenation and PCCM was consulted for further management.   Past Medical History:  Diastolic congestive heart failure, type 2 diabetes, prior CVA, atrial fibrillation on chronic anticoagulation, obstructive sleep apnea, hyperlipidemia, hypertension, CAD, and CKD stage III   Significant Hospital Events:  Admitted 1/7  Consults:    Procedures:    Significant Diagnostic Tests:  CXR 1/7 >Worsening CHF with bilateral effusions left greater than right.  Micro Data:  COVID 1/7 > Negative  RVP 1/7 >   Antimicrobials:     Interim History / Subjective:  Lethargic but able to answer all questions appropriately  Objective   Blood pressure (!) 136/56, pulse 86, temperature 99.4 F (37.4 C), temperature source Axillary, resp. rate 14, height 5\' 9"  (1.753 m), weight 114.3 kg, SpO2 100 %.    FiO2 (%):  [40 %] 40 %   Intake/Output Summary (Last 24 hours) at 05/22/2020 1002 Last data filed at 05/22/2020 0859 Gross per 24 hour  Intake 246 ml  Output 550 ml  Net -304 ml   Filed Weights   05/20/20 1148 05/22/20 0331  Weight: 98.4 kg 114.3 kg    Examination: General: Chronically ill appearing elderly deconditioned elderly male lying in bed in no acute distress HEENT: BiPAP mask in place, MM pink/moist, PERRL,  Neuro: Slightly lethargic but arouses easily to verbal stimuli, nonfocal, alert and oriented x3 CV: s1s2 regular rate and rhythm, no murmur, rubs, or gallops,  PULM: Diminished breath sounds bilaterally, low tidal volumes on BiPAP, in no acute respiratory distress currently GI: soft, bowel sounds hypoactive in all 4 quadrants, non-tender, distended, obese, percutaneous cholecystectomy tube to lateral upper right quadrant Extremities: warm/dry, 2+ pitting bilateral lower extremit edema  Skin: no rashes or lesions\  Resolved Hospital Problem list  Assessment & Plan:  Acute decompensated diastolic congestive heart  failure -Most recent echocardiogram on 03/26/2020 with EF of 55 to 60% with regional wall motion abnormalities and mild left ventricular hypertrophy.  Grade 2 diastolic dysfunction seen as well. -BNP 618.9 on admission P: Continue to aggressively diurese will be dose high-dose Lasix later this afternoon Trend treatment Monitor renal function closely Strict intake and output, Place Foley catheter Continue oral antihypertensives including hydralazine and Imdur Closely monitor hemodynamics in the ICU setting Fluid restriction Daily weight  Brief respiratory arrest -Patient suffered brief respiratory arrest morning of 1/9.  Patient was witnessed to become apneic with severe hypoxia and cyanosis requiring assisted ventilations with bagging.  Patient did not reportedly lose pulses Acute Hypoxic and hypercapnic Respiratory Failure  -Patient has intermittently required BiPAP therapy during hospitalization -Post respiratory arrest ABG obtained with PCO2 elevated at 78.3 History of OSA -Reported history of noncompliance with CPAP P: Continue BiPAP therapy currently Patient is high risk for needing intubation for airway protection Transfer to ICU Patient endorsed he would want intubation if indicated Head of bed elevated 30 degrees.  Follow intermittent chest x-ray and ABG.   N.p.o. Ensure adequate pulmonary hygiene  Minimize sedating medications Close monitoring for respiratory fatigue  Acute Kidney Injury  -Patient presented with creatinine 1.90 up from 1.25 04/05/2020 P: Creatinine improving with diuresis Follow renal function / urine output Trend Bmet Avoid nephrotoxins Ensure adequate renal perfusion  Strict intake and output  Chronic atrial fibrillation -Home medications include Coumadin History of hypertension -Home medications include hydralazine, Lasix, and Imdur P: Rate remains controlled Continue home Coumadin May need to consider transition to IV heparin drip if  patient will require prolonged n.p.o. status Continue home oral antihypertensives Diurese as above Continuous telemetry  Hyponatremia -Chart review reveals patient typically has sodium levels on the low end of normal P: Trend to bmet  Type 2 diabetes -Home medication includes glipizide and 7030 injectable insulin P: Continue SSI CBGs every 4 hours Hold home glipizide  Best practice (evaluated daily)  Diet: NPO while in BIPAP Pain/Anxiety/Delirium protocol (if indicated): As needed  VAP protocol (if indicated): N/A DVT prophylaxis: Coumadin  GI prophylaxis: PPI Glucose control: SSI Mobility: Bedrest  Disposition: ICU Family communication: Called and spoke to patient's daughter [name] over the phone in the morning of 11/9.  She was updated regarding patient's respiratory arrest.  She verbalized understanding and did confirm that her father would want to be a full code with aggressive measures.  Goals of Care:  Last date of multidisciplinary goals of care discussion: Pending Family and staff present: Pending Summary of discussion: Pending Follow up goals of care discussion due: Pending Code Status: Full  Labs   CBC: Recent Labs  Lab 05/20/20 1132 05/21/20 0559 05/21/20 0908 05/22/20 0203  WBC 8.0 6.5  --  6.4  NEUTROABS 6.5  --   --   --   HGB 10.7* 10.2* 11.9* 9.9*  HCT 36.4* 34.6* 35.0* 32.7*  MCV 104.3* 104.2*  --  101.2*  PLT 228 178  --  177    Basic Metabolic Panel: Recent Labs  Lab 05/20/20 1132 05/21/20 0559 05/21/20 0908 05/22/20 0203  NA 132* 132* 131* 132*  K 3.7 3.6 3.7 4.1  CL 82* 82*  --  81*  CO2 36* 40*  --  40*  GLUCOSE 137* 132*  --  174*  BUN 42* 42*  --  42*  CREATININE 1.90* 1.70*  --  1.61*  CALCIUM 9.2  9.3  --  9.2   GFR: Estimated Creatinine Clearance: 46.4 mL/min (A) (by C-G formula based on SCr of 1.61 mg/dL (H)). Recent Labs  Lab 05/20/20 1132 05/21/20 0559 05/22/20 0203  WBC 8.0 6.5 6.4    Liver Function  Tests: Recent Labs  Lab 05/20/20 1132  AST 20  ALT 13  ALKPHOS 81  BILITOT 0.7  PROT 7.7  ALBUMIN 3.4*   No results for input(s): LIPASE, AMYLASE in the last 168 hours. No results for input(s): AMMONIA in the last 168 hours.  ABG    Component Value Date/Time   PHART 7.364 05/22/2020 0855   PCO2ART 78.3 (HH) 05/22/2020 0855   PO2ART 47.9 (L) 05/22/2020 0855   HCO3 43.6 (H) 05/22/2020 0855   TCO2 >50 (H) 05/21/2020 0908   O2SAT 80.4 05/22/2020 0855     Coagulation Profile: Recent Labs  Lab 05/20/20 1132 05/21/20 0559 05/22/20 0203  INR 1.4* 1.6* 1.8*    Cardiac Enzymes: No results for input(s): CKTOTAL, CKMB, CKMBINDEX, TROPONINI in the last 168 hours.  HbA1C: Hgb A1c MFr Bld  Date/Time Value Ref Range Status  05/20/2020 08:26 PM 5.6 4.8 - 5.6 % Final    Comment:    (NOTE) Pre diabetes:          5.7%-6.4%  Diabetes:              >6.4%  Glycemic control for   <7.0% adults with diabetes   03/14/2020 09:25 PM 12.1 (H) 4.8 - 5.6 % Final    Comment:    (NOTE) Pre diabetes:          5.7%-6.4%  Diabetes:              >6.4%  Glycemic control for   <7.0% adults with diabetes     CBG: Recent Labs  Lab 05/21/20 0937 05/21/20 1338 05/21/20 1719 05/21/20 2157 05/22/20 0836  GLUCAP 163* 154* 70 86 150*    Review of Systems:   Denies any acute concerns  Past Medical History:  He,  has a past medical history of Atrial fibrillation (HCC), CAD (coronary artery disease), CHF (congestive heart failure) (HCC), Dementia (HCC), Diabetes mellitus, DVT (deep venous thrombosis) (HCC), Fall at home (10/2015), Hyperlipidemia, Hypertension, Left-sided carotid artery disease (HCC), MRSA infection, Myocardial infarction (HCC), Obesity, Peripheral vascular disease (HCC), Sleep apnea, Stroke (HCC), Stroke (HCC), and Venous insufficiency.   Surgical History:   Past Surgical History:  Procedure Laterality Date  . CORONARY ARTERY BYPASS GRAFT  1997   vein harvest from  right leg  . CORONARY STENT INTERVENTION N/A 08/07/2016   Procedure: Coronary Stent Intervention;  Surgeon: Corky CraftsJayadeep S Varanasi, MD;  [facility];  Service: Cardiovascular;  Laterality: N/A;  RCA  . ESOPHAGOGASTRODUODENOSCOPY N/A 08/12/2016   Procedure: ESOPHAGOGASTRODUODENOSCOPY (EGD);  Surgeon: Carman ChingJames Edwards, MD;  [facility];  Service: Endoscopy;  Laterality: N/A;  . ESOPHAGOGASTRODUODENOSCOPY (EGD) WITH PROPOFOL Left 03/28/2020   Procedure: ESOPHAGOGASTRODUODENOSCOPY (EGD) WITH PROPOFOL;  Surgeon: Bernette RedbirdBuccini, Makoto, MD;  [facility];  Service: Endoscopy;  Laterality: Left;  . INGUINAL HERNIA REPAIR  1980's   [place]  . IR PERC CHOLECYSTOSTOMY  03/16/2020  . LEFT HEART CATH AND CORS/GRAFTS ANGIOGRAPHY N/A 08/07/2016   Procedure: Left Heart Cath and Cors/Grafts Angiography;  Surgeon: Corky CraftsJayadeep S Varanasi, MD;  [facility];  Service: Cardiovascular;  Laterality: N/A;  . PR VEIN BYPASS GRAFT,AORTO-FEM-POP  1980   FEM-FEM BPG by Dr. Orson SlickBowman  .  RADIOLOGY WITH ANESTHESIA N/A 03/15/2020   Procedure: IR WITH ANESTHESIA;  Surgeon: Irish Lack, MD;  Location[facility] OR;  Service: Radiology;  Laterality: N/A;     Social History:   reports that he quit smoking about 42 years ago. His smoking use included cigarettes. He has never used smokeless tobacco. He reports that he does not drink alcohol and does not use drugs.   Family History:  His family history includes Cancer in his father; Diabetes in his daughter, father, and sister; Heart attack in his brother and father; Heart disease in his father, mother, and sister; Hyperlipidemia in his brother, daughter, father, mother, and sister; Hypertension in his brother, daughter, father, mother, sister, and son; Other in his brother, daughter, father, mother, and sister; Varicose Veins in his daughter, mother, and sister.   Allergies No Known Allergies   Home Medications  Prior to  Admission medications   Medication Sig Start Date End Date Taking? Authorizing Provider  acetaminophen (TYLENOL) 500 MG tablet Take 500-1,000 mg by mouth every 6 (six) hours as needed for mild pain or headache.   Yes [provider]  allopurinol (ZYLOPRIM) 100 MG tablet TAKE 2 TABLETS BY MOUTH DAILY. Patient taking differently: Take 100 mg by mouth 2 (two) times daily. 05/03/14  Yes Runell Gess, MD  colchicine 0.6 MG tablet Take 0.6 mg by mouth daily as needed (gout attacks).   Yes [provider]  ferrous sulfate 325 (65 FE) MG tablet Take 1 tablet (325 mg total) by mouth 2 (two) times daily with a meal. Patient taking differently: Take 325 mg by mouth daily with breakfast. 08/09/16  Yes Ghimire, Werner Lean, MD  furosemide (LASIX) 40 MG tablet 40 mg daily except for Monday/Wednesday/ and Friday take 40 mg TWICE A DAY. Patient taking differently: Take 40 mg by mouth in the morning, at noon, and at bedtime. 04/05/20  Yes Gertha Calkin, MD  gabapentin (NEURONTIN) 300 MG capsule Take 300-600 mg by mouth See admin instructions. Take 300 mg by mouth three times a day and 600 mg at bedtime   Yes [provider]  glipiZIDE (GLUCOTROL) 5 MG tablet Take 5 mg by mouth 2 (two) times daily before a meal.   Yes [provider]  hydrALAZINE (APRESOLINE) 100 MG tablet Take 100 mg by mouth 2 (two) times daily.   Yes [provider]  insulin aspart protamine- aspart (NOVOLOG MIX 70/30) (70-30) 100 UNIT/ML injection Inject 0.5 mLs (50 Units total) into the skin 2 (two) times daily before a meal. 04/05/20 05/05/20 Yes Gertha Calkin, MD  isosorbide mononitrate (IMDUR) 60 MG 24 hr tablet Take 1 tablet (60 mg total) by mouth daily. Appointment needed for future refills Patient taking differently: Take 60 mg by mouth daily. 05/03/15  Yes Runell Gess, MD  metolazone (ZAROXOLYN) 2.5 MG tablet Take 2.5 mg by mouth 3 (three) times a week. Monday, Wednesday, Friday   Yes  [provider]  Multiple Vitamin (MULTIVITAMIN WITH MINERALS) TABS tablet Take 1 tablet by mouth daily.   Yes [provider]  nitroGLYCERIN (NITROSTAT) 0.4 MG SL tablet Place 0.4 mg under the tongue every 5 (five) minutes as needed for chest pain.   Yes [provider]  pantoprazole (PROTONIX) 40 MG tablet Take 40 mg by mouth 2 (two) times daily before a meal.    Yes [provider]  potassium chloride (K-DUR) 10 MEQ tablet Take 1 tablet (10 mEq total) by mouth daily. 08/31/16  Yes  Barrett, Joline Salt, PA-C  pravastatin (PRAVACHOL) 40 MG tablet Take 40 mg by mouth at bedtime.    Yes [provider]  PRESCRIPTION MEDICATION Inhale into the lungs See admin instructions. CPAP- At bedtime and during any time of rest   Yes [provider]  QUEtiapine (SEROQUEL) 100 MG tablet Take 100 mg by mouth at bedtime.   Yes [provider]  sodium chloride flush (NS) 0.9 % SOLN Instill 5 mL daily into percutaneous drain daily x 8 weeks. 04/05/20  Yes Gertha Calkin, MD  warfarin (COUMADIN) 1 MG tablet Take 1 mg by mouth daily. Taking with the 5mg  = 6mg  daily 12/18/19  Yes [provider]  warfarin (COUMADIN) 5 MG tablet Take 5 mg by mouth daily with supper. Taking with the 1mg  = 6mg  daily, unless directed differently by clinic 01/28/20  Yes [provider]  docusate sodium (COLACE) 100 MG capsule Take 1 capsule (100 mg total) by mouth 2 (two) times daily. Patient taking differently: Take 100 mg by mouth daily as needed. 12/16/18   , MD  oxyCODONE (OXY IR/ROXICODONE) 5 MG immediate release tablet Take 1 tablet (5 mg total) by mouth every 4 (four) hours as needed (pain). Patient not taking: No sig reported 12/16/18   01/30/20, MD     Critical care time:    Performed by: 02/15/19  Total critical care time: 40 minutes  Critical care time was exclusive of separately billable procedures and treating other  patients.  Critical care was necessary to treat or prevent imminent or life-threatening deterioration.  Critical care was time spent personally by me on the following activities: development of treatment plan with patient and/or surrogate as well as nursing, discussions with consultants, evaluation of patient's response to treatment, examination of patient, obtaining history from patient or surrogate, ordering and performing treatments and interventions, ordering and review of laboratory studies, ordering and review of radiographic studies, pulse oximetry and re-evaluation of patient's condition.  Jadene Pierini, NP-C [facility] Contact / Pager information can be found on Amion  05/22/2020, 10:32 AM

## 2020-05-22 NOTE — Progress Notes (Signed)
Pt transferred from [facility] with the following belongings:  Shoes Clothes Dentures & denture paste Watch

## 2020-05-22 NOTE — Progress Notes (Signed)
Subjective:   On exam this morning, patient is on nasal cannula and states that he is feeling well.  He was transitioned from BiPAP to nasal cannula overnight.  Denies shortness of breath, chest pain, abdominal pain, nausea or vomiting.  Does state his abdomen seems more distended.  Responded to CODE BLUE for this patient for brief respiratory arrest.  Per nursing staff, they were turning him when he became unresponsive and desatted to 46%.  He had a weak pulse and required assistance from bag-valve-mask for approximately 2 minutes.  He then became responsive again and breathing spontaneously, he is alert and responding properly to questions.  Denies chest pain, leg pain.  Does not have any particular complaints.  Objective:  Vital signs in last 24 hours: Vitals:   05/22/20 0030 05/22/20 0331 05/22/20 0338 05/22/20 0631  BP:  119/75    Pulse: 93 90 87 86  Resp:  17 (!) 25 14  Temp:  99.4 F (37.4 C)    TempSrc:  Axillary    SpO2: 100% 100% 100% 100%  Weight:  114.3 kg    Height:        Physical Exam Constitutional: no acute distress, very hard of hearing Head: atraumatic ENT: external ears normal Eyes: EOMI Cardiovascular: regular rate and rhythm, normal heart sounds, 2+ bilateral pitting edema Pulmonary: effort normal, crackles at lung bases bilaterally, decreased air movement on the left Abdominal: Distended, nontender, no rebound tenderness, bowel sounds normal Skin: warm and dry Neurological: alert, no focal deficit Psychiatric: normal mood and affect  Assessment/Plan: Isaiah [name] is a 80 y.o. male with hx of HFrEF (EF 55 to 60%), CAD, DM, CVA, CKD 3, HLD, OSA, A. fib on Coumadin presenting with shortness of breath.  Volume up on exam and required BiPAP, now on nasal cannula. Diuresing.  Active Problems:   AKI (acute kidney injury) (HCC)   Acute exacerbation of congestive heart failure (HCC)   Acute on chronic respiratory failure with hypoxia and hypercapnia  (HCC)   Acute on chronic hypoxic and hypercapnic respiratory failure 2/2 HFpEF exacerbation Brief respiratory arrest OSA Presented volume up on exam and had pulmonary edema as well as worsening bilateral pleural effusions on chest x-ray.  Last echo on 11/13 with EF 55 to 60% and grade 2 diastolic dysfunction.  OSA and likely OHS.  ABGs as below, with moderate improvement with BiPAP and then mild worsening this morning after positioning to a nasal cannula. Likely chronic metabolic compensation, bicarb stable at 40.  Required BiPAP, trialed on high flow nasal cannula but had respiratory arrest so was back on BiPAP.  Continue to diurese, dry weight unknown but 98.5 kg during last hospital admission.  Urine output of 550 recorded yesterday, but per nursing is inaccurate due to incontinence and difficulty with male pure wick. Recent Labs    05/21/20 0908 05/21/20 1810 05/22/20 0855  PHART 7.351 7.399 7.364  PCO2ART 85.9* 70.1* 78.3*  PO2ART 128* 66.3* 47.9*  -Continue BiPAP -Transfer to ICU -Likely exchange CPAP for BiPAP on discharge -May benefit from thoracentesis for increasing chronic pleural effusions -Continue IV Lasix 80 mg daily -Follow-up echo -Strict ins and outs -Daily weights -Daily BMP  AKI on CKD Creatinine 1.61 < 1.70 < 1.90. Baseline around 1.2.  Likely prerenal/cardiorenal. -Daily BMP -Avoid nephrotoxins  Hyponatremia Na stable around 132. Likely dilutional 2/2 heart failure. -Trend BMP  DM type II Home regimen of 70/30 50 units twice daily and glipizide 5 mg twice daily.  Currently  fairly controlled with SSI. -Continue SSI  HTN -Continue home hydralazine 100 mg twice daily, Imdur 60 mg daily, and Lasix as above  A fib Currently rate controlled. -Continue home Coumadin  CAD  last catheter March 2018 with severe disease, history of CABG and stents. -Continue home pravastatin 40 mg, Imdur 60 mg, warfarin as above   Diet: N.p.o. IVF: None VTE:  Coumadin Prior to Admission Living Arrangement: Home Anticipated Discharge Location: Home Barriers to Discharge: Respiratory failure Dispo: Anticipated discharge in approximately 3-5 day(s).   Remo Lipps, MD 05/22/2020, 6:34 AM Pager: [phone] After 5pm on weekdays and 1pm on weekends: On Call pager [phone]

## 2020-05-22 NOTE — Progress Notes (Signed)
CRITICAL VALUE ALERT  Critical Value:  PCO2 78.3  Date & Time Notied:  05/22/2020 0815  Provider Notified: Dr. Imogene Burn  Orders Received/Actions taken: Bipap

## 2020-05-23 ENCOUNTER — Inpatient Hospital Stay (HOSPITAL_COMMUNITY): Payer: Medicare Other

## 2020-05-23 DIAGNOSIS — J9621 Acute and chronic respiratory failure with hypoxia: Secondary | ICD-10-CM | POA: Diagnosis not present

## 2020-05-23 DIAGNOSIS — J9622 Acute and chronic respiratory failure with hypercapnia: Secondary | ICD-10-CM | POA: Diagnosis not present

## 2020-05-23 LAB — GLUCOSE, CAPILLARY
Glucose-Capillary: 110 mg/dL — ABNORMAL HIGH (ref 70–99)
Glucose-Capillary: 158 mg/dL — ABNORMAL HIGH (ref 70–99)
Glucose-Capillary: 178 mg/dL — ABNORMAL HIGH (ref 70–99)
Glucose-Capillary: 165 mg/dL — ABNORMAL HIGH (ref 70–99)

## 2020-05-23 LAB — BASIC METABOLIC PANEL
Anion gap: 9 (ref 5–15)
BUN: 39 mg/dL — ABNORMAL HIGH (ref 8–23)
CO2: 42 mmol/L — ABNORMAL HIGH (ref 22–32)
Calcium: 9.1 mg/dL (ref 8.9–10.3)
Chloride: 84 mmol/L — ABNORMAL LOW (ref 98–111)
Creatinine, Ser: 1.65 mg/dL — ABNORMAL HIGH (ref 0.61–1.24)
GFR, Estimated: 42 mL/min — ABNORMAL LOW (ref >60)
Glucose, Bld: 116 mg/dL — ABNORMAL HIGH (ref 70–99)
Potassium: 3.7 mmol/L (ref 3.5–5.1)
Sodium: 135 mmol/L (ref 135–145)

## 2020-05-23 LAB — CBC
HCT: 34 % — ABNORMAL LOW (ref 39.0–52.0)
Hemoglobin: 10.1 g/dL — ABNORMAL LOW (ref 13.0–17.0)
MCH: 30.1 pg (ref 26.0–34.0)
MCHC: 29.7 g/dL — ABNORMAL LOW (ref 30.0–36.0)
MCV: 101.5 fL — ABNORMAL HIGH (ref 80.0–100.0)
Platelets: 184 10*3/uL (ref 150–400)
RBC: 3.35 MIL/uL — ABNORMAL LOW (ref 4.22–5.81)
RDW: 15.9 % — ABNORMAL HIGH (ref 11.5–15.5)
WBC: 7.4 10*3/uL (ref 4.0–10.5)
nRBC: 0 % (ref 0.0–0.2)

## 2020-05-23 LAB — PROTIME-INR
INR: 2 — ABNORMAL HIGH (ref 0.8–1.2)
Prothrombin Time: 22.2 seconds — ABNORMAL HIGH (ref 11.4–15.2)

## 2020-05-23 LAB — PROCALCITONIN: Procalcitonin: 0.11 ng/mL

## 2020-05-23 LAB — UREA NITROGEN, URINE: Urea Nitrogen, Ur: 279 mg/dL

## 2020-05-23 MED ORDER — WARFARIN SODIUM 6 MG PO TABS
6.0000 mg | ORAL_TABLET | Freq: Once | ORAL | Status: AC
  Administered 2020-05-23: 6 mg via ORAL
  Filled 2020-05-23: qty 1

## 2020-05-23 NOTE — Progress Notes (Signed)
ANTICOAGULATION CONSULT NOTE  Pharmacy Consult for warfarin Indication: atrial fibrillation  No Known Allergies  Patient Measurements: Height: 5\' 9"  (175.3 cm) Weight: 110.5 kg (243 lb 9.7 oz) IBW/kg (Calculated) : 70.7  Vital Signs: Temp: 97.8 F (36.6 C) (01/10 1200) Temp Source: Oral (01/10 1200) BP: 111/51 (01/10 1200) Pulse Rate: 77 (01/10 1200)  Labs: Recent Labs    05/21/20 0559 05/21/20 0908 05/22/20 0203 05/22/20 1518 05/22/20 1802 05/23/20 0347  HGB 10.2* 11.9* 9.9*  --   --  10.1*  HCT 34.6* 35.0* 32.7*  --   --  34.0*  PLT 178  --  177  --   --  184  LABPROT 18.7*  --  20.2*  --   --  22.2*  INR 1.6*  --  1.8*  --   --  2.0*  CREATININE 1.70*  --  1.61* 1.55*  --  1.65*  TROPONINIHS  --   --   --  38* 40*  --     Estimated Creatinine Clearance: 44.5 mL/min (A) (by C-G formula based on SCr of 1.65 mg/dL (H)).   Medical History: Past Medical History:  Diagnosis Date  . Atrial fibrillation (HCC)    on Coumadin  . CAD (coronary artery disease)    s/p remote CABG, stent in 2019  . CHF (congestive heart failure) (HCC)   . Dementia (HCC)   . Diabetes mellitus   . DVT (deep venous thrombosis) (HCC)   . Fall at home 10/2015  . Hyperlipidemia   . Hypertension   . Left-sided carotid artery disease (HCC)   . MRSA infection   . Myocardial infarction (HCC)   . Obesity   . Peripheral vascular disease (HCC)   . Sleep apnea   . Stroke (HCC)   . Stroke (HCC)   . Venous insufficiency     Medications:  Medications Prior to Admission  Medication Sig Dispense Refill Last Dose  . acetaminophen (TYLENOL) 500 MG tablet Take 500-1,000 mg by mouth every 6 (six) hours as needed for mild pain or headache.   unk  . allopurinol (ZYLOPRIM) 100 MG tablet TAKE 2 TABLETS BY MOUTH DAILY. (Patient taking differently: Take 100 mg by mouth 2 (two) times daily.) 30 tablet 0 05/19/2020 at Unknown time  . colchicine 0.6 MG tablet Take 0.6 mg by mouth daily as needed (gout  attacks).   unk  . ferrous sulfate 325 (65 FE) MG tablet Take 1 tablet (325 mg total) by mouth 2 (two) times daily with a meal. (Patient taking differently: Take 325 mg by mouth daily with breakfast.) 90 tablet 0 05/19/2020 at Unknown time  . furosemide (LASIX) 40 MG tablet 40 mg daily except for Monday/Wednesday/ and Friday take 40 mg TWICE A DAY. (Patient taking differently: Take 40 mg by mouth in the morning, at noon, and at bedtime.)   05/19/2020 at Unknown time  . gabapentin (NEURONTIN) 300 MG capsule Take 300-600 mg by mouth See admin instructions. Take 300 mg by mouth three times a day and 600 mg at bedtime   05/19/2020 at Unknown time  . glipiZIDE (GLUCOTROL) 5 MG tablet Take 5 mg by mouth 2 (two) times daily before a meal.   05/19/2020 at Unknown time  . hydrALAZINE (APRESOLINE) 100 MG tablet Take 100 mg by mouth 2 (two) times daily.   05/19/2020 at Unknown time  . insulin aspart protamine- aspart (NOVOLOG MIX 70/30) (70-30) 100 UNIT/ML injection Inject 0.5 mLs (50 Units total) into the skin 2 (two)  times daily before a meal. 30 mL 0 05/19/2020 at Unknown time  . isosorbide mononitrate (IMDUR) 60 MG 24 hr tablet Take 1 tablet (60 mg total) by mouth daily. Appointment needed for future refills (Patient taking differently: Take 60 mg by mouth daily.) 30 tablet 0 05/19/2020 at Unknown time  . metolazone (ZAROXOLYN) 2.5 MG tablet Take 2.5 mg by mouth 3 (three) times a week. Monday, Wednesday, Friday   05/18/2020  . Multiple Vitamin (MULTIVITAMIN WITH MINERALS) TABS tablet Take 1 tablet by mouth daily.   05/19/2020 at Unknown time  . nitroGLYCERIN (NITROSTAT) 0.4 MG SL tablet Place 0.4 mg under the tongue every 5 (five) minutes as needed for chest pain.   unk  . pantoprazole (PROTONIX) 40 MG tablet Take 40 mg by mouth 2 (two) times daily before a meal.    05/19/2020 at Unknown time  . potassium chloride (K-DUR) 10 MEQ tablet Take 1 tablet (10 mEq total) by mouth daily. 30 tablet 5 05/19/2020 at Unknown time  .  pravastatin (PRAVACHOL) 40 MG tablet Take 40 mg by mouth at bedtime.    05/19/2020 at Unknown time  . PRESCRIPTION MEDICATION Inhale into the lungs See admin instructions. CPAP- At bedtime and during any time of rest     . QUEtiapine (SEROQUEL) 100 MG tablet Take 100 mg by mouth at bedtime.   05/19/2020 at Unknown time  . sodium chloride flush (NS) 0.9 % SOLN Instill 5 mL daily into percutaneous drain daily x 8 weeks. 56 Syringe 0   . warfarin (COUMADIN) 1 MG tablet Take 1 mg by mouth daily. Taking with the 5mg  = 6mg  daily   05/19/2020 at 1700  . warfarin (COUMADIN) 5 MG tablet Take 5 mg by mouth daily with supper. Taking with the 1mg  = 6mg  daily, unless directed differently by clinic   05/19/2020 at 1700  . docusate sodium (COLACE) 100 MG capsule Take 1 capsule (100 mg total) by mouth 2 (two) times daily. (Patient taking differently: Take 100 mg by mouth daily as needed.) 10 capsule 0 unk  . oxyCODONE (OXY IR/ROXICODONE) 5 MG immediate release tablet Take 1 tablet (5 mg total) by mouth every 4 (four) hours as needed (pain). (Patient not taking: No sig reported) 30 tablet 0 Not Taking at Unknown time    Assessment: 80 year old male admitted for HF exacerbation. History of afib on warfarin, home dose is 6 mg daily.   INR today is therapeutic at 2. Hg improved, plt wnl. No active bleed issues reported.  Goal of Therapy:  INR 2-3 Monitor platelets by anticoagulation protocol: Yes   Plan:  Warfarin 6 mg PO x 1 tonight  Monitor daily INR, CBC, s/sx bleeding   , PharmD., BCPS, BCCCP Clinical Pharmacist Please refer to [facility] for unit-specific pharmacist

## 2020-05-23 NOTE — Progress Notes (Signed)
NAME:  Isaiah [name], MRN:  [id number], DOB:  21-Aug-1940, LOS: 3 ADMISSION DATE:  05/20/2020, CONSULTATION DATE:  05/22/2020 REFERRING MD:  Dr. Antony Contras, CHIEF COMPLAINT:  Respiratory arrest    Brief History:  80 year old male presented with progressive shortness of breath and hypoxia with bilateral lower extremity swelling on admission.  Admitted per teaching service for acute CHF exasperation and volume overload.  Morning of 05/22/2020 patient suffered brief respiratory arrest for which PCCM was consulted for further management  Past Medical History:  Diastolic congestive heart failure, type 2 diabetes, prior CVA, atrial fibrillation on chronic anticoagulation, obstructive sleep apnea, hyperlipidemia, hypertension, CAD, and CKD stage III   Significant Hospital Events:  Admitted 1/7 1/9 critical care consulted for near respiratory arrest placed on BiPAP responded to noninvasive support and diuretics 1/10: Improving oxygen requirements improving Consults:    Procedures:    Significant Diagnostic Tests:  CXR 1/7 >Worsening CHF with bilateral effusions left greater than right.  Micro Data:  COVID 1/7 > Negative  RVP 1/7 > negative  Antimicrobials:     Interim History / Subjective:  Says he feels much better  Objective   Blood pressure 114/64, pulse 76, temperature 98.4 F (36.9 C), temperature source Axillary, resp. rate (Abnormal) 22, height 5\' 9"  (1.753 m), weight 110.5 kg, SpO2 97 %.    FiO2 (%):  [60 %] 60 %   Intake/Output Summary (Last 24 hours) at 05/23/2020 0830 Last data filed at 05/23/2020 0600 Gross per 24 hour  Intake 6 ml  Output 2190 ml  Net -2184 ml   Filed Weights   05/20/20 1148 05/22/20 0331 05/22/20 1118  Weight: 98.4 kg 114.3 kg 110.5 kg    Examination: 80 year old white male is currently sitting up eating his breakfast he is in no acute distress HEENT normocephalic atraumatic no jugular venous distention mucous membranes are moist Pulmonary clear,  diminished bases, no current accessory use.  6 L via nasal cannula high flow.  Saturations mid 90s Cardiac regular irregular no murmur rub or gallop Abdomen soft not tender no organomegaly Extremities 4+ pitting edema bilateral lower extremities brisk capillary refill Neuro hard of hearing but intact GU clear yellow  Resolved Hospital Problem list   Brief respiratory arrest 1/9  Assessment & Plan:  Acute decompensated diastolic congestive heart failure, history of hypertension -Most recent echocardiogram on 03/26/2020 with EF of 55 to 60% with regional wall motion abnormalities and mild left ventricular hypertrophy.  Grade 2 diastolic dysfunction seen as well. -BNP 618.9 on admission, better with IV diuresis and positive pressure ventilation via BiPAP, I suspect there is an element of decompensated OSA OHS here likely contributing Plan Continue aggressive diuresis Trend BNP Blood pressure control with hydralazine and Imdur Daily weight Strict intake output  Acute Hypoxic and hypercapnic Respiratory Failure  History of OSA -Reported history of noncompliance with CPAP, although he states he uses it religiously this may be more of an issue of needing more support Plan Continue to wean oxygen BiPAP at at bedtime and as needed We will get an arterial blood gas in the morning, and then once again as we get closer to discharge, I think he may need BiPAP at home instead of CPAP at this point suspect there is a component of OHS as well Continue IV diuresis Chest x-ray today Mobilization  Acute Kidney Injury  -Patient presented with creatinine 1.90 up from 1.25 04/05/2020, serum creatinine holding in the 1.5-1.65 range -Recorded net negative of 3.5 L Plan Continue to  push diuresis  Avoid nephrotoxins  A.m. chemistry  Strict intake output  Chronic atrial fibrillation -Home medications include Coumadin Plan Continue telemetry monitoring Continue warfarin   Hyponatremia -Chart  review reveals patient typically has sodium levels on the low end of normal Plan Continue to trend  Type 2 diabetes -Home medication includes glipizide and 7030 injectable insulin Plan Sliding scale insulin Holding oral agent  Best practice (evaluated daily)  Diet: Heart healthy Pain/Anxiety/Delirium protocol (if indicated): As needed  VAP protocol (if indicated): N/A DVT prophylaxis: Coumadin  GI prophylaxis: PPI Glucose control: SSI, changed to [facility] at bedtime 1/10 Mobility: Bedrest get out of bed 1/10 Disposition: ICU, will evaluate later in the afternoon on 1/10, could probably leave the ICU Family communication: Called and spoke to patient's daughter [name] over the phone in the morning of 11/9.  She was updated regarding patient's respiratory arrest.  She verbalized understanding and did confirm that her father would want to be a full code with aggressive measures.  Goals of Care:  Last date of multidisciplinary goals of care discussion: Pending Family and staff present: Pending Summary of discussion: Pending Follow up goals of care discussion due: Pending Code Status: Full   Critical care time: Not indicated     Simonne Martinet ACNP-BC [facility] Pulmonary/Critical Care Pager # [phone] OR # [phone] if no answer

## 2020-05-24 DIAGNOSIS — J9621 Acute and chronic respiratory failure with hypoxia: Secondary | ICD-10-CM | POA: Diagnosis not present

## 2020-05-24 DIAGNOSIS — I5043 Acute on chronic combined systolic (congestive) and diastolic (congestive) heart failure: Secondary | ICD-10-CM

## 2020-05-24 DIAGNOSIS — J9622 Acute and chronic respiratory failure with hypercapnia: Secondary | ICD-10-CM | POA: Diagnosis not present

## 2020-05-24 DIAGNOSIS — N179 Acute kidney failure, unspecified: Secondary | ICD-10-CM | POA: Diagnosis not present

## 2020-05-24 LAB — GLUCOSE, CAPILLARY
Glucose-Capillary: 126 mg/dL — ABNORMAL HIGH (ref 70–99)
Glucose-Capillary: 160 mg/dL — ABNORMAL HIGH (ref 70–99)
Glucose-Capillary: 186 mg/dL — ABNORMAL HIGH (ref 70–99)
Glucose-Capillary: 137 mg/dL — ABNORMAL HIGH (ref 70–99)

## 2020-05-24 LAB — CBC
HCT: 33.1 % — ABNORMAL LOW (ref 39.0–52.0)
Hemoglobin: 9.8 g/dL — ABNORMAL LOW (ref 13.0–17.0)
MCH: 30.5 pg (ref 26.0–34.0)
MCHC: 29.6 g/dL — ABNORMAL LOW (ref 30.0–36.0)
MCV: 103.1 fL — ABNORMAL HIGH (ref 80.0–100.0)
Platelets: 184 10*3/uL (ref 150–400)
RBC: 3.21 MIL/uL — ABNORMAL LOW (ref 4.22–5.81)
RDW: 15.5 % (ref 11.5–15.5)
WBC: 6.2 10*3/uL (ref 4.0–10.5)
nRBC: 0 % (ref 0.0–0.2)

## 2020-05-24 LAB — BASIC METABOLIC PANEL
Anion gap: 12 (ref 5–15)
BUN: 42 mg/dL — ABNORMAL HIGH (ref 8–23)
CO2: 39 mmol/L — ABNORMAL HIGH (ref 22–32)
Calcium: 8.9 mg/dL (ref 8.9–10.3)
Chloride: 81 mmol/L — ABNORMAL LOW (ref 98–111)
Creatinine, Ser: 1.54 mg/dL — ABNORMAL HIGH (ref 0.61–1.24)
GFR, Estimated: 46 mL/min — ABNORMAL LOW (ref >60)
Glucose, Bld: 191 mg/dL — ABNORMAL HIGH (ref 70–99)
Potassium: 3.9 mmol/L (ref 3.5–5.1)
Sodium: 132 mmol/L — ABNORMAL LOW (ref 135–145)

## 2020-05-24 LAB — POCT I-STAT 7, (LYTES, BLD GAS, ICA,H+H)
Acid-Base Excess: 21 mmol/L — ABNORMAL HIGH (ref 0.0–2.0)
Bicarbonate: 50 mmol/L — ABNORMAL HIGH (ref 20.0–28.0)
Calcium, Ion: 1.17 mmol/L (ref 1.15–1.40)
HCT: 34 % — ABNORMAL LOW (ref 39.0–52.0)
Hemoglobin: 11.6 g/dL — ABNORMAL LOW (ref 13.0–17.0)
O2 Saturation: 99 %
Patient temperature: 98.2
Potassium: 3.7 mmol/L (ref 3.5–5.1)
Sodium: 131 mmol/L — ABNORMAL LOW (ref 135–145)
TCO2: >50 mmol/L — ABNORMAL HIGH (ref 22–32)
pCO2 arterial: 82.6 mmHg (ref 32.0–48.0)
pH, Arterial: 7.39 (ref 7.350–7.450)
pO2, Arterial: 145 mmHg — ABNORMAL HIGH (ref 83.0–108.0)

## 2020-05-24 LAB — PROTIME-INR
INR: 2.1 — ABNORMAL HIGH (ref 0.8–1.2)
Prothrombin Time: 22.9 seconds — ABNORMAL HIGH (ref 11.4–15.2)

## 2020-05-24 LAB — PROCALCITONIN: Procalcitonin: <0.1 ng/mL

## 2020-05-24 MED ORDER — WARFARIN SODIUM 6 MG PO TABS
6.0000 mg | ORAL_TABLET | Freq: Every day | ORAL | Status: DC
  Administered 2020-05-24 – 2020-05-25 (×2): 6 mg via ORAL
  Filled 2020-05-24 (×2): qty 1

## 2020-05-24 MED ORDER — FUROSEMIDE 10 MG/ML IJ SOLN
10.0000 mg/h | INTRAVENOUS | Status: DC
  Administered 2020-05-24: 12 mg/h via INTRAVENOUS
  Administered 2020-05-25: 20 mg/h via INTRAVENOUS
  Administered 2020-05-25: 12 mg/h via INTRAVENOUS
  Administered 2020-05-26 – 2020-05-31 (×12): 20 mg/h via INTRAVENOUS
  Administered 2020-05-31: 10 mg/h via INTRAVENOUS
  Filled 2020-05-24 (×20): qty 20

## 2020-05-24 MED ORDER — POTASSIUM CHLORIDE CRYS ER 20 MEQ PO TBCR
40.0000 meq | EXTENDED_RELEASE_TABLET | Freq: Once | ORAL | Status: AC
  Administered 2020-05-24: 40 meq via ORAL
  Filled 2020-05-24: qty 2

## 2020-05-24 MED ORDER — SPIRONOLACTONE 25 MG PO TABS
50.0000 mg | ORAL_TABLET | Freq: Every day | ORAL | Status: DC
  Administered 2020-05-24 – 2020-05-25 (×2): 50 mg via ORAL
  Filled 2020-05-24 (×3): qty 2

## 2020-05-24 MED ORDER — FUROSEMIDE 10 MG/ML IJ SOLN
80.0000 mg | Freq: Three times a day (TID) | INTRAMUSCULAR | Status: DC
  Administered 2020-05-24: 80 mg via INTRAVENOUS
  Filled 2020-05-24: qty 8

## 2020-05-24 MED ORDER — ACETAZOLAMIDE 250 MG PO TABS
250.0000 mg | ORAL_TABLET | Freq: Two times a day (BID) | ORAL | Status: DC
  Administered 2020-05-24 – 2020-05-25 (×3): 250 mg via ORAL
  Filled 2020-05-24 (×3): qty 1

## 2020-05-24 MED FILL — Medication: Qty: 1 | Status: AC

## 2020-05-24 NOTE — Progress Notes (Signed)
SLP Cancellation Note  Patient Details Name: Isaiah [name] MRN: [id number] DOB: Feb 11, 1941   Cancelled treatment:        Has not been able to come off Bipap per RN. Will cancel for today and check on tomorrow for swallow eval.    Royce Macadamia 05/24/2020, 8:41 AM   Breck Coons Lonell Face.Ed Nurse, children's [phone] Office [phone]

## 2020-05-24 NOTE — Progress Notes (Signed)
Placed pt on HHFNC at 25L/50% to help get him off bipap but to avoid him having low spo2 episodes again. Pt denies SOB, no increased WOB. CCM MD in agreement to try until pt diuresed enough and will continue to wear bipap PRN and QHS

## 2020-05-24 NOTE — Progress Notes (Signed)
RT placed patient on BIPAP at this time. Pt tolerating settings well. RT will monitor patient as needed.

## 2020-05-24 NOTE — Progress Notes (Addendum)
NAME:  Isaiah [name], MRN:  [id number], DOB:  [birth year], LOS: 4 ADMISSION DATE:  05/20/2020, CONSULTATION DATE:  05/22/2020 REFERRING MD:  Dr. Antony Contras, CHIEF COMPLAINT:  Respiratory arrest    Brief History:  80 year old male presented with progressive shortness of breath and hypoxia with bilateral lower extremity swelling on admission.  Admitted per teaching service for acute CHF exasperation and volume overload.  Morning of 05/22/2020 patient suffered brief respiratory arrest for which PCCM was consulted for further management  Past Medical History:  Diastolic congestive heart failure, type 2 diabetes, prior CVA, atrial fibrillation on chronic anticoagulation, obstructive sleep apnea, hyperlipidemia, hypertension, CAD, and CKD stage III   Significant Hospital Events:  Admitted 1/7 1/9 critical care consulted for near respiratory arrest placed on BiPAP responded to noninvasive support and diuretics  1/10: Improving oxygen requirements improving 10/11: Has had 2 more isolated episodes of hypoxia with saturations as low as the 40s typically happening after positional change responding well to positive pressure via noninvasive mask Lasix dosing tripled added spironolactone Consults:    Procedures:  Echocardiogram 1/9: Anterior septal hypokinesis with LV EF now 45 to 50% with decreased LV function.  Unable to determine diastolic function.  But there was interventricular septal flattening in systole and diastole consistent with right ventricular volume overload.  There is severely elevated pulmonary artery systolic pressure.  Significant Diagnostic Tests:  CXR 1/7 >Worsening CHF with bilateral effusions left greater than right.  Micro Data:  COVID 1/7 > Negative  RVP 1/7 > negative  Antimicrobials:     Interim History / Subjective:  Better on BiPAP again  Objective   Blood pressure 134/72, pulse 74, temperature 98.1 F (36.7 C), temperature source Oral, resp. rate 17, height 5\' 9"   (1.753 m), weight 110.5 kg, SpO2 95 %.        Intake/Output Summary (Last 24 hours) at 05/24/2020 0855 Last data filed at 05/24/2020 0600 Gross per 24 hour  Intake 240 ml  Output 1175 ml  Net -935 ml   Filed Weights   05/20/20 1148 05/22/20 0331 05/22/20 1118  Weight: 98.4 kg 114.3 kg 110.5 kg    Examination: This is a chronically ill 80 year old white male is currently sitting upright in bed, he is in no distress but back on BiPAP HEENT normocephalic atraumatic no jugular venous distention appreciated BiPAP mask in place Pulmonary: Diminished throughout no accessory use currently on BiPAP Cardiac: Regular irregular Abdomen: Soft nontender Extremities: Diffuse anasarca with pitting edema Neuro: Awake oriented no focal deficits.  Resolved Hospital Problem list   Brief respiratory arrest 1/9  Assessment & Plan:  Acute decompensated diastolic congestive heart failure, history of hypertension -Most recent echocardiogram on 03/26/2020 with EF of 55 to 60% with regional wall motion abnormalities and mild left ventricular hypertrophy.  Grade 2 diastolic dysfunction seen as well. -BNP 618.9 on admission, better with IV diuresis and positive pressure ventilation via BiPAP, I suspect there is an element of decompensated OSA OHS here likely contributing Plan Pushing diuresis Trend BNP Continue blood pressure control Daily weight Strict intake output. We will ask heart failure team to see him, followed in the outpatient setting, I think he is going to need fairly close follow-up  Acute Hypoxic and hypercapnic Respiratory Failure  History of OSA Portable chest x-ray personally reviewed: Demonstrates cardiomegaly, prior sternotomy.  Diffuse bilateral airspace disease with bilateral pleural effusions Continues to have episodes of desaturation particularly in the supine position Needs more aggressive diuresis Plan IV Lasix escalated to every  8 hours Added spironolactone Change oxygen to  heated high flow to utilize liter flow rate as PEEP effect Continue BiPAP as needed and mandatory at at bedtime ABG in the morning At this point has mild metabolic alkalosis superimposed on respiratory acidosis we may need to give you a dose of Diamox tomorrow A.m. chest x-ray Will need to assess ABG closer to the time of discharge once he is closer to her dry baseline weight F/u SLP eval   Acute Kidney Injury  -Patient presented with creatinine 1.90 up from 1.25 04/05/2020, serum creatinine holding in the 1.5-1.65 range Plan Continue to trend renal function closely with increased diuretics AM chemistry Strict intake output  Chronic atrial fibrillation -Home medications include Coumadin Plan Continue telemetry monitoring Continue warfarin  Hyponatremia -Chart review reveals patient typically has sodium levels on the low end of normal Plan Trend w/ diuresis   Type 2 diabetes -Home medication includes glipizide and 7030 injectable insulin Plan Holding oral agent and cont AC/HS ssi  Best practice (evaluated daily)  Diet: Heart healthy Pain/Anxiety/Delirium protocol (if indicated): As needed  VAP protocol (if indicated): N/A DVT prophylaxis: Coumadin  GI prophylaxis: PPI Glucose control: SSI, changed to [facility] at bedtime 1/10 Mobility: Bedrest get out of bed 1/10 Disposition: ICU, will evaluate later in the afternoon on 1/10, could probably leave the ICU Family communication: Called and spoke to patient's daughter [name] over the phone in the morning of 11/9.  She was updated regarding patient's respiratory arrest.  She verbalized understanding and did confirm that her father would want to be a full code with aggressive measures.  Goals of Care:  Last date of multidisciplinary goals of care discussion: Pending Family and staff present: Pending Summary of discussion: Pending Follow up goals of care discussion due: Pending Code Status: Full   Critical care time: Not  indicated     Simonne Martinet ACNP-BC [facility] Pulmonary/Critical Care Pager # [phone] OR # [phone] if no answer

## 2020-05-24 NOTE — Progress Notes (Signed)
eLink Physician-Brief Progress Note Patient Name: Isaiah [name] DOB: Jun 29, 1940 MRN: [id number]   Date of Service  05/24/2020  HPI/Events of Note  ABG on BiPAP = 7.39/82.6/145/50.0. The patient's pH is normal. I suspect that his pCO2 is chronically in this range.   eICU Interventions  Continue present management.      Intervention Category Major Interventions: Respiratory failure - evaluation and management  Taite Baldassari Eugene 05/24/2020, 6:15 AM

## 2020-05-24 NOTE — Progress Notes (Signed)
ANTICOAGULATION CONSULT NOTE  Pharmacy Consult for warfarin Indication: atrial fibrillation  No Known Allergies  Patient Measurements: Height: 5\' 9"  (175.3 cm) Weight: 110.5 kg (243 lb 9.7 oz) IBW/kg (Calculated) : 70.7  Vital Signs: Temp: 98.1 F (36.7 C) (01/11 0800) Temp Source: Oral (01/11 0800) BP: 139/68 (01/11 1200) Pulse Rate: 81 (01/11 1200)  Labs: Recent Labs    05/22/20 0203 05/22/20 1518 05/22/20 1802 05/23/20 0347 05/24/20 0233 05/24/20 0433 05/24/20 0606  HGB 9.9*  --   --  10.1* 9.8* 11.6*  --   HCT 32.7*  --   --  34.0* 33.1* 34.0*  --   PLT 177  --   --  184 184  --   --   LABPROT 20.2*  --   --  22.2*  --   --  22.9*  INR 1.8*  --   --  2.0*  --   --  2.1*  CREATININE 1.61* 1.55*  --  1.65* 1.54*  --   --   TROPONINIHS  --  38* 40*  --   --   --   --     Estimated Creatinine Clearance: 47.6 mL/min (A) (by C-G formula based on SCr of 1.54 mg/dL (H)).   Medical History: Past Medical History:  Diagnosis Date  . Atrial fibrillation (HCC)    on Coumadin  . CAD (coronary artery disease)    s/p remote CABG, stent in 2019  . CHF (congestive heart failure) (HCC)   . Dementia (HCC)   . Diabetes mellitus   . DVT (deep venous thrombosis) (HCC)   . Fall at home 10/2015  . Hyperlipidemia   . Hypertension   . Left-sided carotid artery disease (HCC)   . MRSA infection   . Myocardial infarction (HCC)   . Obesity   . Peripheral vascular disease (HCC)   . Sleep apnea   . Stroke (HCC)   . Stroke (HCC)   . Venous insufficiency     Medications:  Medications Prior to Admission  Medication Sig Dispense Refill Last Dose  . acetaminophen (TYLENOL) 500 MG tablet Take 500-1,000 mg by mouth every 6 (six) hours as needed for mild pain or headache.   unk  . allopurinol (ZYLOPRIM) 100 MG tablet TAKE 2 TABLETS BY MOUTH DAILY. (Patient taking differently: Take 100 mg by mouth 2 (two) times daily.) 30 tablet 0 05/19/2020 at Unknown time  . colchicine 0.6 MG tablet  Take 0.6 mg by mouth daily as needed (gout attacks).   unk  . ferrous sulfate 325 (65 FE) MG tablet Take 1 tablet (325 mg total) by mouth 2 (two) times daily with a meal. (Patient taking differently: Take 325 mg by mouth daily with breakfast.) 90 tablet 0 05/19/2020 at Unknown time  . furosemide (LASIX) 40 MG tablet 40 mg daily except for Monday/Wednesday/ and Friday take 40 mg TWICE A DAY. (Patient taking differently: Take 40 mg by mouth in the morning, at noon, and at bedtime.)   05/19/2020 at Unknown time  . gabapentin (NEURONTIN) 300 MG capsule Take 300-600 mg by mouth See admin instructions. Take 300 mg by mouth three times a day and 600 mg at bedtime   05/19/2020 at Unknown time  . glipiZIDE (GLUCOTROL) 5 MG tablet Take 5 mg by mouth 2 (two) times daily before a meal.   05/19/2020 at Unknown time  . hydrALAZINE (APRESOLINE) 100 MG tablet Take 100 mg by mouth 2 (two) times daily.   05/19/2020 at Unknown time  .  insulin aspart protamine- aspart (NOVOLOG MIX 70/30) (70-30) 100 UNIT/ML injection Inject 0.5 mLs (50 Units total) into the skin 2 (two) times daily before a meal. 30 mL 0 05/19/2020 at Unknown time  . isosorbide mononitrate (IMDUR) 60 MG 24 hr tablet Take 1 tablet (60 mg total) by mouth daily. Appointment needed for future refills (Patient taking differently: Take 60 mg by mouth daily.) 30 tablet 0 05/19/2020 at Unknown time  . metolazone (ZAROXOLYN) 2.5 MG tablet Take 2.5 mg by mouth 3 (three) times a week. Monday, Wednesday, Friday   05/18/2020  . Multiple Vitamin (MULTIVITAMIN WITH MINERALS) TABS tablet Take 1 tablet by mouth daily.   05/19/2020 at Unknown time  . nitroGLYCERIN (NITROSTAT) 0.4 MG SL tablet Place 0.4 mg under the tongue every 5 (five) minutes as needed for chest pain.   unk  . pantoprazole (PROTONIX) 40 MG tablet Take 40 mg by mouth 2 (two) times daily before a meal.    05/19/2020 at Unknown time  . potassium chloride (K-DUR) 10 MEQ tablet Take 1 tablet (10 mEq total) by mouth daily. 30  tablet 5 05/19/2020 at Unknown time  . pravastatin (PRAVACHOL) 40 MG tablet Take 40 mg by mouth at bedtime.    05/19/2020 at Unknown time  . PRESCRIPTION MEDICATION Inhale into the lungs See admin instructions. CPAP- At bedtime and during any time of rest     . QUEtiapine (SEROQUEL) 100 MG tablet Take 100 mg by mouth at bedtime.   05/19/2020 at Unknown time  . sodium chloride flush (NS) 0.9 % SOLN Instill 5 mL daily into percutaneous drain daily x 8 weeks. 56 Syringe 0   . warfarin (COUMADIN) 1 MG tablet Take 1 mg by mouth daily. Taking with the 5mg  = 6mg  daily   05/19/2020 at 1700  . warfarin (COUMADIN) 5 MG tablet Take 5 mg by mouth daily with supper. Taking with the 1mg  = 6mg  daily, unless directed differently by clinic   05/19/2020 at 1700  . docusate sodium (COLACE) 100 MG capsule Take 1 capsule (100 mg total) by mouth 2 (two) times daily. (Patient taking differently: Take 100 mg by mouth daily as needed.) 10 capsule 0 unk  . oxyCODONE (OXY IR/ROXICODONE) 5 MG immediate release tablet Take 1 tablet (5 mg total) by mouth every 4 (four) hours as needed (pain). (Patient not taking: No sig reported) 30 tablet 0 Not Taking at Unknown time    Assessment: 80 year old male admitted for HF exacerbation. History of afib on warfarin, home dose is 6 mg daily.   INR remains therapeutic at 2.1. Hg improved, plt wnl. No active bleed issues reported.  Goal of Therapy:  INR 2-3 Monitor platelets by anticoagulation protocol: Yes   Plan:  Resume home warfarin regimen of 6 mg daily  Monitor daily INR, CBC, s/sx bleeding   , PharmD., BCPS, BCCCP Clinical Pharmacist Please refer to [facility] for unit-specific pharmacist

## 2020-05-24 NOTE — Consult Note (Addendum)
Advanced Heart Failure Team Consult Note   Primary Physician: [place], [name], MD PCP-Cardiologist:  Nanetta Batty, MD  Reason for Consultation: Acute Systolic Heart Failure   HPI:    Isaiah [name] is seen today for evaluation of acute systolic heart failure at the request of Dr. Antony Contras, Internal Medicine.   80 y/o male w/ h/o obesity, OSA, chronic diastolic heart failure, pulmonary hypertension, systemic HTN, HLD, Stage III CKD, atrial fibrillation, T2DM and CAD w/ h/o CABG. Last LHC in 2018 showed patent SVG-OM with Y graft SVG to diagonal originating from mid portion of the OM graft. Moderate disease in the origin of the Y graft portion to the diagonal. Patent LIMA to LAD. Native RCA had 80% proximal stenosis treated w/ PCI + DES. Also w/ severe PAD w/ known occluded right external iliac artery, remote history of left to right femorofemoral crossover grafting  Had fairly recent hospitalization 11/21 for septic shock 2/2 acute cholecystitis c/b by PEA arrest occurring in the setting of percutaneous placement of a gallbladder drain. Noted to have severe biventricular dysfunction post-arrest. Echo 11/3 showed LVEF 20% (previously normal) + severe RV dysfunction. Hs trop had also peaked to1,364 post arrest, but felt 2/2 demand ischemia. He had f/u limited echo 11/13 that showed interval improvement w/ normalization of LVEF back to 55-60%. His RV was noted to be mildly enlarged w/ normal systolic function. G2DD noted.    Readmitted 05/20/20 for a/c CHF. Presented w/ several day history of SOB and bilateral LEE. COVID and flu negative. BNP 618. CXR showed edema. Hs trop low level and flat, 38>>40. Also w/ AKI. Scr 1.9 on admit (baseline ~1.2). Admitted IM and started on IV lasix.   Echo repeated and showed slight drop in LVEF compared to prior study 11/13. LVEF mildly reduced 45-50% w/ RWMA w/ anteroseptal hypokinesis. RV systolic function noted to be normal but  the interventricular septum is  flattened in systole and diastole, consistent with right ventricular pressure and volume overload. Severely elevated pulmonary artery systolic pressure noted, 63 mmHg.   Morning of 05/22/2020 patient suffered brief respiratory arrest and placed on BiPAP. PCCM now following.   He is overall net negative 4.3L this admit and wt is down 8 lb. Scr improved, down from 1.9 to 1.5. However continues w/ intermittent hypoxia felt 2/2 persistent volume overload from acute CHF. CXR repeated yesterday showing diffuse interstitial pulmonary opacity, and layering bilateral pleural effusions, findings remain consistent with edema or infection. WBC however WNL and PCT <0.10, favoring CHF over PNA. AHF team now asked to evaluate. He is currently on IV Lasix 80 mg Q8H + spiro 50 mg daily. SBP in the 140s. Currently on HFNC 20L/min. O2 sats low 90s.   He denies any recent CP. Reports good compliance w/ home CPAP. Former smoker, quit 20 years ago. sedentary lifestyle. Denies h/o DVT/PE.    Echo 05/22/20 Anteroseptal hypokinesis. Left ventricular ejection fraction, by estimation, is 45 to 50%. The left ventricle has mildly decreased function. The left ventricle demonstrates regional wall motion abnormalities (see scoring diagram/findings for description). Left ventricular diastolic parameters are indeterminate. There is the interventricular septum is flattened in systole and diastole, consistent with right ventricular pressure and volume overload. 2. Right ventricular systolic function is normal. The right ventricular size is normal. There is severely elevated pulmonary artery systolic pressure. 3. Left atrial size was mildly dilated. 4. Right atrial size was mildly dilated. 5. The mitral valve is normal in structure. Trivial mitral valve  regurgitation. No evidence of mitral stenosis. 6. The aortic valve is calcified. There is mild calcification of the aortic valve. There is mild thickening of the aortic valve. Aortic  valve regurgitation is not visualized. Mild aortic valve sclerosis is present, with no evidence of aortic valve stenosis. 7. The inferior vena cava is dilated in size with <50% respiratory variability, suggesting right atrial pressure of 15 mmHg.  Review of Systems: [y] = yes, [ ]  = no   . General: Weight gain [ Y]; Weight loss [ ] ; Anorexia [ ] ; Fatigue [ ] ; Fever [ ] ; Chills [ ] ; Weakness [ ]   . Cardiac: Chest pain/pressure [ ] ; Resting SOB [Y ]; Exertional SOB [ Y]; Orthopnea [ ] ; Pedal Edema [Y ]; Palpitations [ ] ; Syncope [ ] ; Presyncope [ ] ; Paroxysmal nocturnal dyspnea[Y ]  . Pulmonary: Cough [ ] ; Wheezing[ ] ; Hemoptysis[ ] ; Sputum [ ] ; Snoring [ ]   . GI: Vomiting[ ] ; Dysphagia[ ] ; Melena[ ] ; Hematochezia [ ] ; Heartburn[ ] ; Abdominal pain [ ] ; Constipation [ ] ; Diarrhea [ ] ; BRBPR [ ]   . GU: Hematuria[ ] ; Dysuria [ ] ; Nocturia[ ]   . Vascular: Pain in legs with walking [ ] ; Pain in feet with lying flat [ ] ; Non-healing sores [ ] ; Stroke [ ] ; TIA [ ] ; Slurred speech [ ] ;  . Neuro: Headaches[ ] ; Vertigo[ ] ; Seizures[ ] ; Paresthesias[ ] ;Blurred vision [ ] ; Diplopia [ ] ; Vision changes [ ]   . Ortho/Skin: Arthritis [ ] ; Joint pain [ ] ; Muscle pain [ ] ; Joint swelling [ ] ; Back Pain [ ] ; Rash [ ]   . Psych: Depression[ ] ; Anxiety[ ]   . Heme: Bleeding problems [ ] ; Clotting disorders [ ] ; Anemia [ ]   . Endocrine: Diabetes [ ] ; Thyroid dysfunction[ ]   Home Medications Prior to Admission medications   Medication Sig Start Date End Date Taking? Authorizing Provider  acetaminophen (TYLENOL) 500 MG tablet Take 500-1,000 mg by mouth every 6 (six) hours as needed for mild pain or headache.   Yes [provider]  allopurinol (ZYLOPRIM) 100 MG tablet TAKE 2 TABLETS BY MOUTH DAILY. Patient taking differently: Take 100 mg by mouth 2 (two) times daily. 05/03/14  Yes , MD  colchicine 0.6 MG tablet Take 0.6 mg by mouth daily as needed (gout attacks).   Yes [provider]   ferrous sulfate 325 (65 FE) MG tablet Take 1 tablet (325 mg total) by mouth 2 (two) times daily with a meal. Patient taking differently: Take 325 mg by mouth daily with breakfast. 08/09/16  Yes Ghimire, , MD  furosemide (LASIX) 40 MG tablet 40 mg daily except for Monday/Wednesday/ and Friday take 40 mg TWICE A DAY. Patient taking differently: Take 40 mg by mouth in the morning, at noon, and at bedtime. 04/05/20  Yes , MD  gabapentin (NEURONTIN) 300 MG capsule Take 300-600 mg by mouth See admin instructions. Take 300 mg by mouth three times a day and 600 mg at bedtime   Yes [provider]  glipiZIDE (GLUCOTROL) 5 MG tablet Take 5 mg by mouth 2 (two) times daily before a meal.   Yes [provider]  hydrALAZINE (APRESOLINE) 100 MG tablet Take 100 mg by mouth 2 (two) times daily.   Yes [provider]  insulin aspart protamine- aspart (NOVOLOG MIX 70/30) (70-30) 100 UNIT/ML injection Inject 0.5 mLs (50 Units total) into the skin 2 (two) times daily before a meal. 04/05/20 05/05/20 Yes , MD  isosorbide mononitrate (IMDUR) 60 MG 24 hr tablet Take 1 tablet (60 mg total) by mouth daily. Appointment needed for future refills Patient taking differently: Take 60 mg by mouth daily. 05/03/15  Yes Runell Gess, MD  metolazone (ZAROXOLYN) 2.5 MG tablet Take 2.5 mg by mouth 3 (three) times a week. Monday, Wednesday, Friday   Yes [provider]  Multiple Vitamin (MULTIVITAMIN WITH MINERALS) TABS tablet Take 1 tablet by mouth daily.   Yes [provider]  nitroGLYCERIN (NITROSTAT) 0.4 MG SL tablet Place 0.4 mg under the tongue every 5 (five) minutes as needed for chest pain.   Yes [provider]  pantoprazole (PROTONIX) 40 MG tablet Take 40 mg by mouth 2 (two) times daily before a meal.    Yes [provider]  potassium chloride (K-DUR) 10 MEQ tablet Take 1 tablet (10 mEq total) by mouth daily. 08/31/16  Yes  Barrett, Joline Salt, PA-C  pravastatin (PRAVACHOL) 40 MG tablet Take 40 mg by mouth at bedtime.    Yes [provider]  PRESCRIPTION MEDICATION Inhale into the lungs See admin instructions. CPAP- At bedtime and during any time of rest   Yes [provider]  QUEtiapine (SEROQUEL) 100 MG tablet Take 100 mg by mouth at bedtime.   Yes [provider]  sodium chloride flush (NS) 0.9 % SOLN Instill 5 mL daily into percutaneous drain daily x 8 weeks. 04/05/20  Yes Gertha Calkin, MD  warfarin (COUMADIN) 1 MG tablet Take 1 mg by mouth daily. Taking with the 5mg  = 6mg  daily 12/18/19  Yes [provider]  warfarin (COUMADIN) 5 MG tablet Take 5 mg by mouth daily with supper. Taking with the 1mg  = 6mg  daily, unless directed differently by clinic 01/28/20  Yes [provider]  docusate sodium (COLACE) 100 MG capsule Take 1 capsule (100 mg total) by mouth 2 (two) times daily. Patient taking differently: Take 100 mg by mouth daily as needed. 12/16/18   Jadene Pierini, MD  oxyCODONE (OXY IR/ROXICODONE) 5 MG immediate release tablet Take 1 tablet (5 mg total) by mouth every 4 (four) hours as needed (pain). Patient not taking: No sig reported 12/16/18   Jadene Pierini, MD    Past Medical History: Past Medical History:  Diagnosis Date  . Atrial fibrillation (HCC)    on Coumadin  . CAD (coronary artery disease)    s/p remote CABG, stent in 2019  . CHF (congestive heart failure) (HCC)   . Dementia (HCC)   . Diabetes mellitus   . DVT (deep venous thrombosis) (HCC)   . Fall at home 10/2015  . Hyperlipidemia   . Hypertension   . Left-sided carotid artery disease (HCC)   . MRSA infection   . Myocardial infarction (HCC)   . Obesity   . Peripheral vascular disease (HCC)   . Sleep apnea   . Stroke (HCC)   . Stroke (HCC)   . Venous insufficiency     Past Surgical History: Past Surgical History:  Procedure Laterality Date  . CORONARY ARTERY BYPASS GRAFT  1997    vein harvest from right leg  . CORONARY STENT INTERVENTION N/A 08/07/2016   Procedure: Coronary Stent Intervention;  Surgeon: Corky Crafts, MD;  Location: [facility] INVASIVE CV LAB;  Service: Cardiovascular;  Laterality: N/A;  RCA  . ESOPHAGOGASTRODUODENOSCOPY N/A 08/12/2016   Procedure: ESOPHAGOGASTRODUODENOSCOPY (EGD);  Surgeon: Carman Ching, MD;  Location: [facility] ENDOSCOPY;  Service: Endoscopy;  Laterality: N/A;  . ESOPHAGOGASTRODUODENOSCOPY (EGD) WITH  PROPOFOL Left 03/28/2020   Procedure: ESOPHAGOGASTRODUODENOSCOPY (EGD) WITH PROPOFOL;  Surgeon: Bernette RedbirdBuccini, Daren, MD;  [facility];  Service: Endoscopy;  Laterality: Left;  . INGUINAL HERNIA REPAIR  1980's   [facility],  [facility]  . IR PERC CHOLECYSTOSTOMY  03/16/2020  . LEFT HEART CATH AND CORS/GRAFTS ANGIOGRAPHY N/A 08/07/2016   Procedure: Left Heart Cath and Cors/Grafts Angiography;  Surgeon: Corky CraftsJayadeep S Varanasi, MD;  [facility];  Service: Cardiovascular;  Laterality: N/A;  . PR VEIN BYPASS GRAFT,AORTO-FEM-POP  1980   FEM-FEM BPG by Dr. Orson SlickBowman  . RADIOLOGY WITH ANESTHESIA N/A 03/15/2020   Procedure: IR WITH ANESTHESIA;  Surgeon: Irish LackYamagata, Glenn, MD;  [facility];  Service: Radiology;  Laterality: N/A;    Family History: Family History  Problem Relation Age of Onset  . Other Mother        varicose veins  . Heart disease Mother   . Hyperlipidemia Mother   . Hypertension Mother   . Varicose Veins Mother   . Cancer Father   . Diabetes Father   . Heart disease Father        before age 80  . Hyperlipidemia Father   . Hypertension Father   . Other Father        varicose veins  . Heart attack Father   . Diabetes Daughter   . Hyperlipidemia Daughter   . Hypertension Daughter   . Other Daughter        varicose veins  . Varicose Veins Daughter   . Hypertension Son   . Diabetes Sister   . Heart disease Sister        DVT  . Other Sister        varicose veins  . Hyperlipidemia Sister   .  Hypertension Sister   . Varicose Veins Sister   . Other Brother        varicose veins  . Hyperlipidemia Brother   . Hypertension Brother   . Heart attack Brother     Social History: Social History   Socioeconomic History  . Marital status: Divorced    Spouse name: Not on file  . Number of children: Not on file  . Years of education: Not on file  . Highest education level: Not on file  Occupational History  . Not on file  Tobacco Use  . Smoking status: Former Smoker    Types: Cigarettes    Quit date: 05/14/1978    Years since quitting: 42.0  . Smokeless tobacco: Never Used  Substance and Sexual Activity  . Alcohol use: No  . Drug use: No  . Sexual activity: Not on file  Other Topics Concern  . Not on file  Social History Narrative  . Not on file   Social Determinants of Health   Financial Resource Strain: Not on file  Food Insecurity: Not on file  Transportation Needs: Not on file  Physical Activity: Not on file  Stress: Not on file  Social Connections: Not on file    Allergies:  No Known Allergies  Objective:    Vital Signs:   Temp:  [97.2 F (36.2 C)-98.7 F (37.1 C)] 98.1 F (36.7 C) (01/11 0800) Pulse Rate:  [67-86] 73 (01/11 0926) Resp:  [13-30] 21 (01/11 0926) BP: (110-149)/(51-93) 135/71 (01/11 0926) SpO2:  [59 %-100 %] 97 % (01/11 0926) Last BM Date: 05/23/20  Weight change: Filed Weights   05/20/20 1148 05/22/20 0331 05/22/20 1118  Weight: 98.4 kg 114.3 kg 110.5  kg    Intake/Output:   Intake/Output Summary (Last 24 hours) at 05/24/2020 1027 Last data filed at 05/24/2020 0800 Gross per 24 hour  Intake 240 ml  Output 1400 ml  Net -1160 ml      Physical Exam    General:  Chronically ill appearing, obese elderly WM on HFNC  HEENT: normal Neck: supple. JVP elevated to ear . Carotids 2+ bilat; no bruits. No lymphadenopathy or thyromegaly appreciated. Cor: PMI nondisplaced. irregular irregular rhythm and rate. No rubs, gallops or  murmurs. Lungs: decreased BS at the bases bilaterally  Abdomen: edematous/ obese, soft, nontender, +distended. No hepatosplenomegaly. No bruits or masses. Good bowel sounds. Extremities: no cyanosis, clubbing, rash, 3+ bilateral LEE pitting edema up to thighs/abdomen Neuro: alert & orientedx3, cranial nerves grossly intact. moves all 4 extremities w/o difficulty. Affect pleasant   Telemetry   Chronic Afib, rate controlled 70s   EKG    Afib 86 bpm   Labs   Basic Metabolic Panel: Recent Labs  Lab 05/21/20 0559 05/21/20 0908 05/22/20 0203 05/22/20 1518 05/23/20 0347 05/24/20 0233 05/24/20 0433  NA 132*   < > 132* 134* 135 132* 131*  K 3.6   < > 4.1 3.9 3.7 3.9 3.7  CL 82*  --  81* 82* 84* 81*  --   CO2 40*  --  40* 39* 42* 39*  --   GLUCOSE 132*  --  174* 116* 116* 191*  --   BUN 42*  --  42* 40* 39* 42*  --   CREATININE 1.70*  --  1.61* 1.55* 1.65* 1.54*  --   CALCIUM 9.3  --  9.2 9.2 9.1 8.9  --    < > = values in this interval not displayed.    Liver Function Tests: Recent Labs  Lab 05/20/20 1132  AST 20  ALT 13  ALKPHOS 81  BILITOT 0.7  PROT 7.7  ALBUMIN 3.4*   No results for input(s): LIPASE, AMYLASE in the last 168 hours. No results for input(s): AMMONIA in the last 168 hours.  CBC: Recent Labs  Lab 05/20/20 1132 05/21/20 0559 05/21/20 0908 05/22/20 0203 05/23/20 0347 05/24/20 0233 05/24/20 0433  WBC 8.0 6.5  --  6.4 7.4 6.2  --   NEUTROABS 6.5  --   --   --   --   --   --   HGB 10.7* 10.2* 11.9* 9.9* 10.1* 9.8* 11.6*  HCT 36.4* 34.6* 35.0* 32.7* 34.0* 33.1* 34.0*  MCV 104.3* 104.2*  --  101.2* 101.5* 103.1*  --   PLT 228 178  --  177 184 184  --     Cardiac Enzymes: No results for input(s): CKTOTAL, CKMB, CKMBINDEX, TROPONINI in the last 168 hours.  BNP: BNP (last 3 results) Recent Labs    03/14/20 0752 03/22/20 0117 05/20/20 1133  BNP 532.7* 441.2* 618.9*    ProBNP (last 3 results) No results for input(s): PROBNP in the last  8760 hours.   CBG: Recent Labs  Lab 05/23/20 0756 05/23/20 1155 05/23/20 1728 05/23/20 2152 05/24/20 0757  GLUCAP 110* 158* 178* 165* 126*    Coagulation Studies: Recent Labs    05/22/20 0203 05/23/20 0347 05/24/20 0606  LABPROT 20.2* 22.2* 22.9*  INR 1.8* 2.0* 2.1*     Imaging    No results found.   Medications:     Current Medications: . allopurinol  100 mg Oral BID  . Chlorhexidine Gluconate Cloth  6 each Topical Daily  . ferrous  sulfate  325 mg Oral Q breakfast  . furosemide  80 mg Intravenous Q8H  . gabapentin  300 mg Oral TID  . hydrALAZINE  100 mg Oral BID  . insulin aspart  0-15 Units Subcutaneous TID WC  . isosorbide mononitrate  60 mg Oral Daily  . OLANZapine zydis  10 mg Oral QHS  . pantoprazole  40 mg Oral BID AC  . polyethylene glycol  17 g Oral BID  . potassium chloride  10 mEq Oral Daily  . pravastatin  40 mg Oral QHS  . senna  1 tablet Oral Daily  . sodium chloride flush  3 mL Intravenous Q12H  . spironolactone  50 mg Oral Daily  . Warfarin - Pharmacist Dosing Inpatient   Does not apply q1600     Infusions: . sodium chloride        Assessment/Plan   1. Acute Combined Systolic and Diastolic Heart Failure w/ Pulmonary Hypertension  - Echo 2018 EF 55-60%, RV mildly reduced - Echo 03/16/20 EF 20%, RV severely reduced (post PEA arrest) - Limited Echo 03/25/20 EF normalized 55-60%. RV mildly enlarged w/ normal systolic function  - Echo 1/22 LVEF mildly reduced 45-50%. RV normal. RVSP severely elevated 63 mmHg  - Markedly edematous w/ high supp O2 requirements. O2 sats low 90s on HFNC. NYHAC Class IIIb - Start Lasix gtt. Start at 12 mg/hr + acetazolamide 250 mg bid   - Strict I/Os and daily wts - May need placement of PICC line to follow CVPs  - Unable to use UNNA boots due to h/o severe PAD  - Plan RHC once diuresed +/- LHC to reassess graft/ RCA stent patency given slight drop in LVEF w/ + RWMAs (pending renal function).  - Suspect  likely combination WHO Groupd 2 + 3 PH. RVSP severely elevated on TTE, 64 mmHg.   - RHC after diuresis   - Continue w/ BiPAP  - Consider PFTs (former smoker)   - V/Q scan (r/o WHO Group 4)   2. Afib: - Rate controlled - continue coumadin   3. CAD: - h/o remote CABG - LHC in 2018 w/ patent LIMA-LAD and SVG-OM. Underwent PCI to native pRCA - denies recent ischemic CP - Hs trop flat and low level, 38>>40 - continue medical therapy   4. Stage III CKD - baseline SCr 1.2 -1.9 on admit, 1.5 today - follow BMP w/ diuresis   Length of Stay: [address], PA-C  05/24/2020, 10:27 AM  Advanced Heart Failure Team Pager [phone] (M-F; 7a - 4p)  Please contact [facility] for night-coverage after hours (4p -7a ) and weekends on [email or web address]  Patient seen with PA, agree with the above note.   Complicated admission in 11/21 with acute cholecystitis s/p cholecystostomy tube, sepsis, suspected stress/Takotsubo-type cardiomyopathy, CHF, and AKI.  He was doing ok at home for a while, but recently with increase LE swelling and dyspnea.  Came to ER 1/7 and was hypoxemic with pulmonary edema on CXR, creatinine up to 1.9.  Reported high sodium diet.    Admitted, had "near-respiratory arrest" on floor but recovered with Bipap, now in ICU on HFNC.    General: NAD Neck: JVP 16 cm, no thyromegaly or thyroid nodule.  Lungs: Crackles at bases.  CV: Nonpalpable PMI.  Heart irregular S1/S2, no S3/S4, no murmur.  2+ edema to thighs.  No carotid bruit.  Unable to palpate pedal pulses.  Abdomen: Soft, nontender, no hepatosplenomegaly, mild distention.  Skin: Intact without lesions or  rashes.  Neurologic: Alert and oriented x 3.  Psych: Normal affect. Extremities: No clubbing or cyanosis.  HEENT: Normal.   1. Acute on chronic heart failure with mid-range EF: Suspect ischemic cardiomyopathy but with prominent signs of RV failure. Echo this admission with EF 45-50%, anteroseptal HK, D-shaped septum  with PASP 63 mmHg and dilated IVC.  During 11/21 admission, initial echo showed EF 20% with severe RV dysfunction.  Repeat echo later in hospitalization with EF up to 55% and mild RV dysfunction.  Thought to have stress/Takotsubo cardiomyopathy in setting of septic shock/cholecystitis. Currently, he is markedly volume overloaded with anasarca.  Creatinine has been fairly stable this admission, 1.54 today.  - He got Lasix 80 mg IV today, will start gtt at 12 mg/hr.  - Will add acetazolamide 250 bid with HCO3 39.  - Replace K aggressively.  - Continue spironolactone 50 mg daily.  - Eventually would like to get him on Jardiance.  - Probably will need RHC when better-diuresed.  2. Suspect OHS/OSA: Patient has OSA on CPAP, also has been on 2-3 L home oxygen.  Remote smoker.  3. Atrial fibrillation: Chronic, rate is controlled. H/o CVA.  - Continue warfarin.  4. CAD: Anterior MI 1994, CABG 1997 with LIMA-LAD and seq SVG-OM/D.  Had PCI to proximal RCA in 2018.  No chest pain. Looking back, EF has generally been in the 50% range (45-50% on echo this admission). HS-TnI minimally elevated with no trend this admission, ACS is unlikely.  - Continue home statin.  - He is on warfarin so no ASA.  5. PAD: H/o L=>R fem-fem crossover grafting, had to be removed due to infection and had patch angioplasty.  6. AKI on CKD stage 3: Initial creatinine 1.9, now down to 1.5.  Follow with diuresis.  7. H/o acute cholecystitis: Has cholecystostomy tube from 11/21.   Marca Ancona 05/24/2020 11:53 AM

## 2020-05-25 ENCOUNTER — Inpatient Hospital Stay: Payer: Self-pay

## 2020-05-25 DIAGNOSIS — J9621 Acute and chronic respiratory failure with hypoxia: Secondary | ICD-10-CM | POA: Diagnosis not present

## 2020-05-25 DIAGNOSIS — J9611 Chronic respiratory failure with hypoxia: Secondary | ICD-10-CM

## 2020-05-25 DIAGNOSIS — J9612 Chronic respiratory failure with hypercapnia: Secondary | ICD-10-CM | POA: Diagnosis not present

## 2020-05-25 DIAGNOSIS — I5023 Acute on chronic systolic (congestive) heart failure: Secondary | ICD-10-CM | POA: Diagnosis not present

## 2020-05-25 LAB — GLUCOSE, CAPILLARY
Glucose-Capillary: 152 mg/dL — ABNORMAL HIGH (ref 70–99)
Glucose-Capillary: 244 mg/dL — ABNORMAL HIGH (ref 70–99)
Glucose-Capillary: 214 mg/dL — ABNORMAL HIGH (ref 70–99)
Glucose-Capillary: 121 mg/dL — ABNORMAL HIGH (ref 70–99)

## 2020-05-25 LAB — BASIC METABOLIC PANEL
Anion gap: 11 (ref 5–15)
BUN: 37 mg/dL — ABNORMAL HIGH (ref 8–23)
CO2: 42 mmol/L — ABNORMAL HIGH (ref 22–32)
Calcium: 8.9 mg/dL (ref 8.9–10.3)
Chloride: 82 mmol/L — ABNORMAL LOW (ref 98–111)
Creatinine, Ser: 1.41 mg/dL — ABNORMAL HIGH (ref 0.61–1.24)
GFR, Estimated: 51 mL/min — ABNORMAL LOW (ref >60)
Glucose, Bld: 112 mg/dL — ABNORMAL HIGH (ref 70–99)
Potassium: 3.9 mmol/L (ref 3.5–5.1)
Sodium: 135 mmol/L (ref 135–145)

## 2020-05-25 LAB — CBC
HCT: 32.1 % — ABNORMAL LOW (ref 39.0–52.0)
Hemoglobin: 9.6 g/dL — ABNORMAL LOW (ref 13.0–17.0)
MCH: 30.3 pg (ref 26.0–34.0)
MCHC: 29.9 g/dL — ABNORMAL LOW (ref 30.0–36.0)
MCV: 101.3 fL — ABNORMAL HIGH (ref 80.0–100.0)
Platelets: 179 10*3/uL (ref 150–400)
RBC: 3.17 MIL/uL — ABNORMAL LOW (ref 4.22–5.81)
RDW: 15.5 % (ref 11.5–15.5)
WBC: 5.9 10*3/uL (ref 4.0–10.5)
nRBC: 0 % (ref 0.0–0.2)

## 2020-05-25 LAB — PROTIME-INR
INR: 2.2 — ABNORMAL HIGH (ref 0.8–1.2)
Prothrombin Time: 23.9 seconds — ABNORMAL HIGH (ref 11.4–15.2)

## 2020-05-25 LAB — AMMONIA: Ammonia: 61 umol/L — ABNORMAL HIGH (ref 9–35)

## 2020-05-25 MED ORDER — POTASSIUM CHLORIDE CRYS ER 20 MEQ PO TBCR
40.0000 meq | EXTENDED_RELEASE_TABLET | Freq: Once | ORAL | Status: AC
  Administered 2020-05-25: 40 meq via ORAL
  Filled 2020-05-25: qty 2

## 2020-05-25 MED ORDER — ACETAZOLAMIDE 250 MG PO TABS
500.0000 mg | ORAL_TABLET | Freq: Two times a day (BID) | ORAL | Status: DC
  Administered 2020-05-25 – 2020-06-01 (×14): 500 mg via ORAL
  Filled 2020-05-25 (×16): qty 2

## 2020-05-25 NOTE — Progress Notes (Signed)
ANTICOAGULATION CONSULT NOTE  Pharmacy Consult for warfarin Indication: atrial fibrillation  No Known Allergies  Patient Measurements: Height: 5\' 9"  (175.3 cm) Weight: 111.3 kg (245 lb 6 oz) IBW/kg (Calculated) : 70.7  Vital Signs: Temp: 96.9 F (36.1 C) (01/12 0800) Temp Source: Axillary (01/12 0800) BP: 126/60 (01/12 0900) Pulse Rate: 79 (01/12 0900)  Labs: Recent Labs    05/22/20 1518 05/22/20 1518 05/22/20 1802 05/23/20 0347 05/24/20 0233 05/24/20 0433 05/24/20 0606 05/25/20 0329  HGB  --    < >  --  10.1* 9.8* 11.6*  --  9.6*  HCT  --    < >  --  34.0* 33.1* 34.0*  --  32.1*  PLT  --   --   --  184 184  --   --  179  LABPROT  --   --   --  22.2*  --   --  22.9* 23.9*  INR  --   --   --  2.0*  --   --  2.1* 2.2*  CREATININE 1.55*  --   --  1.65* 1.54*  --   --  1.41*  TROPONINIHS 38*  --  40*  --   --   --   --   --    < > = values in this interval not displayed.    Estimated Creatinine Clearance: 52.2 mL/min (A) (by C-G formula based on SCr of 1.41 mg/dL (H)).   Medical History: Past Medical History:  Diagnosis Date  . Atrial fibrillation (HCC)    on Coumadin  . CAD (coronary artery disease)    s/p remote CABG, stent in 2019  . CHF (congestive heart failure) (HCC)   . Dementia (HCC)   . Diabetes mellitus   . DVT (deep venous thrombosis) (HCC)   . Fall at home 10/2015  . Hyperlipidemia   . Hypertension   . Left-sided carotid artery disease (HCC)   . MRSA infection   . Myocardial infarction (HCC)   . Obesity   . Peripheral vascular disease (HCC)   . Sleep apnea   . Stroke (HCC)   . Stroke (HCC)   . Venous insufficiency     Medications:  Medications Prior to Admission  Medication Sig Dispense Refill Last Dose  . acetaminophen (TYLENOL) 500 MG tablet Take 500-1,000 mg by mouth every 6 (six) hours as needed for mild pain or headache.   unk  . allopurinol (ZYLOPRIM) 100 MG tablet TAKE 2 TABLETS BY MOUTH DAILY. (Patient taking differently: Take  100 mg by mouth 2 (two) times daily.) 30 tablet 0 05/19/2020 at Unknown time  . colchicine 0.6 MG tablet Take 0.6 mg by mouth daily as needed (gout attacks).   unk  . ferrous sulfate 325 (65 FE) MG tablet Take 1 tablet (325 mg total) by mouth 2 (two) times daily with a meal. (Patient taking differently: Take 325 mg by mouth daily with breakfast.) 90 tablet 0 05/19/2020 at Unknown time  . furosemide (LASIX) 40 MG tablet 40 mg daily except for Monday/Wednesday/ and Friday take 40 mg TWICE A DAY. (Patient taking differently: Take 40 mg by mouth in the morning, at noon, and at bedtime.)   05/19/2020 at Unknown time  . gabapentin (NEURONTIN) 300 MG capsule Take 300-600 mg by mouth See admin instructions. Take 300 mg by mouth three times a day and 600 mg at bedtime   05/19/2020 at Unknown time  . glipiZIDE (GLUCOTROL) 5 MG tablet Take 5 mg by mouth  2 (two) times daily before a meal.   05/19/2020 at Unknown time  . hydrALAZINE (APRESOLINE) 100 MG tablet Take 100 mg by mouth 2 (two) times daily.   05/19/2020 at Unknown time  . insulin aspart protamine- aspart (NOVOLOG MIX 70/30) (70-30) 100 UNIT/ML injection Inject 0.5 mLs (50 Units total) into the skin 2 (two) times daily before a meal. 30 mL 0 05/19/2020 at Unknown time  . isosorbide mononitrate (IMDUR) 60 MG 24 hr tablet Take 1 tablet (60 mg total) by mouth daily. Appointment needed for future refills (Patient taking differently: Take 60 mg by mouth daily.) 30 tablet 0 05/19/2020 at Unknown time  . metolazone (ZAROXOLYN) 2.5 MG tablet Take 2.5 mg by mouth 3 (three) times a week. Monday, Wednesday, Friday   05/18/2020  . Multiple Vitamin (MULTIVITAMIN WITH MINERALS) TABS tablet Take 1 tablet by mouth daily.   05/19/2020 at Unknown time  . nitroGLYCERIN (NITROSTAT) 0.4 MG SL tablet Place 0.4 mg under the tongue every 5 (five) minutes as needed for chest pain.   unk  . pantoprazole (PROTONIX) 40 MG tablet Take 40 mg by mouth 2 (two) times daily before a meal.    05/19/2020 at Unknown  time  . potassium chloride (K-DUR) 10 MEQ tablet Take 1 tablet (10 mEq total) by mouth daily. 30 tablet 5 05/19/2020 at Unknown time  . pravastatin (PRAVACHOL) 40 MG tablet Take 40 mg by mouth at bedtime.    05/19/2020 at Unknown time  . PRESCRIPTION MEDICATION Inhale into the lungs See admin instructions. CPAP- At bedtime and during any time of rest     . QUEtiapine (SEROQUEL) 100 MG tablet Take 100 mg by mouth at bedtime.   05/19/2020 at Unknown time  . sodium chloride flush (NS) 0.9 % SOLN Instill 5 mL daily into percutaneous drain daily x 8 weeks. 56 Syringe 0   . warfarin (COUMADIN) 1 MG tablet Take 1 mg by mouth daily. Taking with the 5mg  = 6mg  daily   05/19/2020 at 1700  . warfarin (COUMADIN) 5 MG tablet Take 5 mg by mouth daily with supper. Taking with the 1mg  = 6mg  daily, unless directed differently by clinic   05/19/2020 at 1700  . docusate sodium (COLACE) 100 MG capsule Take 1 capsule (100 mg total) by mouth 2 (two) times daily. (Patient taking differently: Take 100 mg by mouth daily as needed.) 10 capsule 0 unk  . oxyCODONE (OXY IR/ROXICODONE) 5 MG immediate release tablet Take 1 tablet (5 mg total) by mouth every 4 (four) hours as needed (pain). (Patient not taking: No sig reported) 30 tablet 0 Not Taking at Unknown time    Assessment: 80 year old male admitted for HF exacerbation. History of afib on warfarin, home dose is 6 mg daily.   INR remains therapeutic at 2.2. Hg down, plt wnl. No active bleed issues reported. SCr improving   Goal of Therapy:  INR 2-3 Monitor platelets by anticoagulation protocol: Yes   Plan:  Continue warfarin 6 mg daily  Monitor daily INR, CBC, s/sx bleeding   , PharmD., BCPS, BCCCP Clinical Pharmacist Please refer to [facility] for unit-specific pharmacist

## 2020-05-25 NOTE — Progress Notes (Signed)
NAME:  Isaiah [name], MRN:  [id number], DOB:  March 27, 1941, LOS: 5 ADMISSION DATE:  05/20/2020, CONSULTATION DATE:  05/22/2020 REFERRING MD:  Dr. Antony Contras, CHIEF COMPLAINT:  Respiratory arrest    Brief History:  80 year old male presented with progressive shortness of breath and hypoxia with bilateral lower extremity swelling on admission.  Admitted per teaching service for acute CHF exasperation and volume overload.  Morning of 05/22/2020 patient suffered brief respiratory arrest for which PCCM was consulted for further management  Past Medical History:  Diastolic congestive heart failure, type 2 diabetes, prior CVA, atrial fibrillation on chronic anticoagulation, obstructive sleep apnea, hyperlipidemia, hypertension, CAD, and CKD stage III   Significant Hospital Events:  Admitted 1/7 1/9 critical care consulted for near respiratory arrest placed on BiPAP responded to noninvasive support and diuretics  1/10: Improving oxygen requirements improving 1/11: Has had 2 more isolated episodes of hypoxia with saturations as low as the 40s typically happening after positional change responding well to positive pressure via noninvasive mask Lasix dosing tripled added spironolactone, heart failure team evaluated and started Lasix drip 1/11: Making progress from a fluid volume standpoint but fatigued, still requiring BiPAP.  Overall clinically actually looks worse Consults:    Procedures:  Echocardiogram 1/9: Anterior septal hypokinesis with LV EF now 45 to 50% with decreased LV function.  Unable to determine diastolic function.  But there was interventricular septal flattening in systole and diastole consistent with right ventricular volume overload.  There is severely elevated pulmonary artery systolic pressure.  Significant Diagnostic Tests:  CXR 1/7 >Worsening CHF with bilateral effusions left greater than right.  Micro Data:  COVID 1/7 > Negative  RVP 1/7 > negative  Antimicrobials:     Interim  History / Subjective:  Back on BiPAP  Objective   Blood pressure 132/68, pulse 79, temperature (Abnormal) 96.9 F (36.1 C), temperature source Axillary, resp. rate 20, height 5\' 9"  (1.753 m), weight 111.3 kg, SpO2 97 %.    FiO2 (%):  [50 %] 50 %   Intake/Output Summary (Last 24 hours) at 05/25/2020 0855 Last data filed at 05/25/2020 0800 Gross per 24 hour  Intake 351.62 ml  Output 2870 ml  Net -2518.38 ml   Filed Weights   05/22/20 0331 05/22/20 1118 05/25/20 0400  Weight: 114.3 kg 110.5 kg 111.3 kg    Examination: General this is a chronically ill-appearing 80 year old white male he is currently resting on BiPAP.  He he did have significant fatigue this morning after being placed on high flow oxygen he is starting to recover HEENT neck is large difficult to assess JVD currently has BiPAP mask in place Pulmonary: Shallow respiratory efforts diminished bases no accessory use on BiPAP Cardiac: Regular rhythm Abdomen: Distended.  Percutaneous drain on the right abdomen with bilious output Extremities: Pitting edema Neuro: Opens eyes to request, speech a little slurred but slowly improving, moves all extremities but profoundly weak GU: Clear yellow urine via Foley catheter  Resolved Hospital Problem list   Brief respiratory arrest 1/9  Assessment & Plan:  Acute decompensated diastolic congestive heart failure, history of hypertension -Most recent echocardiogram on 03/26/2020 with EF of 55 to 60% with regional wall motion abnormalities and mild left ventricular hypertrophy.  Grade 2 diastolic dysfunction seen as well. Appreciate heart failure team evaluating Plan Continue IV Lasix  Continue aggressive diuresis  Place PICC line, check central venous pressure and SCV O2 Appreciate heart failure team seeing him, eventually will need right heart cath and possibly left heart cath  Continue Imdur  Acute Hypoxic and hypercapnic Respiratory Failure with pulmonary edema and volume  overload History of OSA Plan Continue IV Lasix drip  Continue spironolactone Metolazone added by heart failure on 1/11 repeating arterial blood gas  Arterial blood gas now, I worry he may need intubation for worsening respiratory fatigue, will speak to daughter again regarding goals of care I am not convinced he is a good candidate for intubation or prolonged mechanical ventilation Scheduled bronchodilators  Continue BiPAP and heated high flow  Acute Kidney Injury  -Patient presented with creatinine 1.90 up from 1.25 04/05/2020, serum creatinine holding in the 1.5-1.65 range Plan Clear trend renal function with diuresis Renal dose medications  Chronic atrial fibrillation -Home medications include Coumadin Plan Continue telemetry monitoring Continue warfarin  Hyponatremia -Chart review reveals patient typically has sodium levels on the low end of normal; this is improved with diuresis Plan Continue to trend  Type 2 diabetes -Home medication includes glipizide and 70/30 injectable insulin Plan Continue sliding-scale insulin  Recent acute cholecystitis status post percutaneous drain placed on 11/3 Plan Continue drainage  Best practice (evaluated daily)  Diet: Heart healthy Pain/Anxiety/Delirium protocol (if indicated): As needed  VAP protocol (if indicated): N/A DVT prophylaxis: Coumadin  GI prophylaxis: PPI Glucose control: SSI, changed to [facility] at bedtime 1/10 Mobility: Bedrest get out of bed 1/10 Disposition: ICU, will reach out to patient's daughter again today on 1/12.  I think we need to discuss goals of care more in depth particularly as he clearly has declined health Goals of Care:  Last date of multidisciplinary goals of care discussion: Pending Family and staff present: Pending Summary of discussion: Pending Follow up goals of care discussion due: Pending Code Status: Full   Critical care time:34 minutes      Simonne Martinet ACNP-BC [facility]  Pulmonary/Critical Care Pager # [phone] OR # [phone] if no answer

## 2020-05-25 NOTE — Progress Notes (Addendum)
Advanced Heart Failure Rounding Note  PCP-Cardiologist: Nanetta Batty, MD   Subjective:    Lasix gtt at 12/hr initiated yesterday + diamox. -3 L in UOP.  Scr down 1.54>>1.41. SBPs low 100s-110s K 3.9  CO2 still elevated at 42. Lethargic but arousable. ABG sent and pending.   Remains markedly fluid overloaded. Awaiting PICC placement to allow for CVP monitoring.   Remains on HFNC. PCCM closely following. May need intubation.    Objective:   Weight Range: 111.3 kg Body mass index is 36.24 kg/m.   Vital Signs:   Temp:  [96.9 F (36.1 C)-98.8 F (37.1 C)] 96.9 F (36.1 C) (01/12 0800) Pulse Rate:  [62-92] 79 (01/12 0900) Resp:  [15-29] 20 (01/12 0800) BP: (93-139)/(45-109) 126/60 (01/12 0900) SpO2:  [86 %-100 %] 99 % (01/12 0900) FiO2 (%):  [50 %] 50 % (01/12 0000) Weight:  [111.3 kg] 111.3 kg (01/12 0400) Last BM Date: 05/20/20  Weight change: Filed Weights   05/22/20 0331 05/22/20 1118 05/25/20 0400  Weight: 114.3 kg 110.5 kg 111.3 kg    Intake/Output:   Intake/Output Summary (Last 24 hours) at 05/25/2020 1023 Last data filed at 05/25/2020 0800 Gross per 24 hour  Intake 231.62 ml  Output 2790 ml  Net -2558.38 ml      Physical Exam    General: chronically ill appearing/obese WM  No resp difficulty HEENT: Normal Neck: Supple. JVP elevated to ear . Carotids 2+ bilat; no bruits. No lymphadenopathy or thyromegaly appreciated. Cor: PMI nondisplaced. Irregularly irregular rhythm, regular rate. No rubs, gallops or murmurs. Lungs: decreased BS at the bases bilaterally  Abdomen: obese/ edematous, soft, nontender, nondistended. No hepatosplenomegaly. No bruits or masses. Good bowel sounds. Extremities: No cyanosis, clubbing, rash, 3+ bilateral LEE up to thighs  Neuro: lethargic but arousable.  Moves extremities   Telemetry   ? Afib appears regular but no visible P-waves, 70s    EKG    No new EKG to review   Labs    CBC Recent Labs    05/24/20 0233  05/24/20 0433 05/25/20 0329  WBC 6.2  --  5.9  HGB 9.8* 11.6* 9.6*  HCT 33.1* 34.0* 32.1*  MCV 103.1*  --  101.3*  PLT 184  --  179   Basic Metabolic Panel Recent Labs    32/95/18 0233 05/24/20 0433 05/25/20 0329  NA 132* 131* 135  K 3.9 3.7 3.9  CL 81*  --  82*  CO2 39*  --  42*  GLUCOSE 191*  --  112*  BUN 42*  --  37*  CREATININE 1.54*  --  1.41*  CALCIUM 8.9  --  8.9   Liver Function Tests No results for input(s): AST, ALT, ALKPHOS, BILITOT, PROT, ALBUMIN in the last 72 hours. No results for input(s): LIPASE, AMYLASE in the last 72 hours. Cardiac Enzymes No results for input(s): CKTOTAL, CKMB, CKMBINDEX, TROPONINI in the last 72 hours.  BNP: BNP (last 3 results) Recent Labs    03/14/20 0752 03/22/20 0117 05/20/20 1133  BNP 532.7* 441.2* 618.9*    ProBNP (last 3 results) No results for input(s): PROBNP in the last 8760 hours.   D-Dimer No results for input(s): DDIMER in the last 72 hours. Hemoglobin A1C No results for input(s): HGBA1C in the last 72 hours. Fasting Lipid Panel No results for input(s): CHOL, HDL, LDLCALC, TRIG, CHOLHDL, LDLDIRECT in the last 72 hours. Thyroid Function Tests No results for input(s): TSH, T4TOTAL, T3FREE, THYROIDAB in the last 72 hours.  Invalid input(s): FREET3  Other results:   Imaging    [place] EKG SITE RITE  Result Date: 05/25/2020 If Site Rite image not attached, placement could not be confirmed due to current cardiac rhythm.     Medications:     Scheduled Medications: . acetaZOLAMIDE  250 mg Oral BID  . allopurinol  100 mg Oral BID  . Chlorhexidine Gluconate Cloth  6 each Topical Daily  . ferrous sulfate  325 mg Oral Q breakfast  . gabapentin  300 mg Oral TID  . hydrALAZINE  100 mg Oral BID  . insulin aspart  0-15 Units Subcutaneous TID WC  . isosorbide mononitrate  60 mg Oral Daily  . OLANZapine zydis  10 mg Oral QHS  . pantoprazole  40 mg Oral BID AC  . polyethylene glycol  17 g Oral BID  .  potassium chloride  10 mEq Oral Daily  . pravastatin  40 mg Oral QHS  . senna  1 tablet Oral Daily  . sodium chloride flush  3 mL Intravenous Q12H  . spironolactone  50 mg Oral Daily  . warfarin  6 mg Oral q1600  . Warfarin - Pharmacist Dosing Inpatient   Does not apply q1600     Infusions: . sodium chloride    . furosemide (LASIX) 200 mg in dextrose 5% 100 mL (2mg /mL) infusion 12 mg/hr (05/25/20 0800)     PRN Medications:  sodium chloride, acetaminophen, ondansetron (ZOFRAN) IV, sodium chloride flush   Assessment/Plan   1. Acute on chronic heart failure with mid-range EF: Suspect ischemic cardiomyopathy but with prominent signs of RV failure. Echo this admission with EF 45-50%, anteroseptal HK, D-shaped septum with PASP 63 mmHg and dilated IVC.  During 11/21 admission, initial echo showed EF 20% with severe RV dysfunction.  Repeat echo later in hospitalization with EF up to 55% and mild RV dysfunction.  Thought to have stress/Takotsubo cardiomyopathy in setting of septic shock/cholecystitis. Currently, he is markedly volume overloaded with anasarca.  Creatinine has been fairly stable this admission, 1.4 today.  - good response to lasix gtt thus far. HCO2 remains elevated at 42 - Continue Lasix gtt at 12mg /hr - Increase acetazolamide to 500 bid - Supp K aggressively.  - Continue spironolactone 50 mg daily.  - Eventually would like to get him on Jardiance.  - Probably will need RHC when better-diuresed.  - no UNNA boots w/ severe PAD  - Awaiting PICC placement to allow for CVP monitoring.  2. Suspect OHS/OSA: Patient has OSA on CPAP, also has been on 2-3 L home oxygen.  Remote smoker.  3. Atrial fibrillation: Chronic, rate is controlled. H/o CVA.  - Continue warfarin.  4. CAD: Anterior MI 1994, CABG 1997 with LIMA-LAD and seq SVG-OM/D.  Had PCI to proximal RCA in 2018.  No chest pain. Looking back, EF has generally been in the 50% range (45-50% on echo this admission). HS-TnI  minimally elevated with no trend this admission, ACS is unlikely.  - Continue home statin.  - He is on warfarin so no ASA.  5. PAD: H/o L=>R fem-fem crossover grafting, had to be removed due to infection and had patch angioplasty.  6. AKI on CKD stage 3: Initial creatinine 1.9, now down to 1.4.  Follow with diuresis.  7. H/o acute cholecystitis: Has cholecystostomy tube from 11/21.  8. Acute Hypoxic Respiratory Failure -  has been on 2-3 L home oxygen. Now w/ increased requirements on 25L/min HFNC  - 2/2 a/c CHF + OHS/OSA and ?  Underlying COPD - continue diuretics per above - BiPAP as needed - ABG pending - PCCM following     Length of Stay: [address] PA-C  05/25/2020, 10:23 AM  Advanced Heart Failure Team Pager [phone] (M-F; 7a - 4p)  Please contact [facility] for night-coverage after hours (4p -7a ) and weekends on [email or web address]  Agree with above.   He has severe chronic hypoxic/hypercarbic respiratory failure due to OSA/OHS with baseline pCO2 ~ 80. Now with severe RV failure and massive volume overload.   Lethargic. Will arouse and fall back to sleep.   General:  Chronically ill appearing. No resp difficulty HEENT: normal on Bipap Neck: supple. JVP up . Carotids 2+ bilat; no bruits. No lymphadenopathy or thryomegaly appreciated. Cor: PMI nondisplaced. Regular rate & rhythm. No rubs, gallops or murmurs. Lungs: coase Abdomen: obese soft, nontender, ++ distended. No hepatosplenomegaly. No bruits or masses. Good bowel sounds. Extremities: no cyanosis, clubbing, rash, 4+ edema Neuro: lethargic but arousable. + asterxis  He has end-stage OSA/OHS with RV failure. Will increase lasix gtt. Awaiting PICC line to follow CVP and co-ox. Check ammonia.   Prognosis guarded.   Arvilla Meres, MD  6:43 PM

## 2020-05-25 NOTE — Progress Notes (Signed)
SLP Cancellation Note  Patient Details Name: Isaiah [name] MRN: [id number] DOB: November 06, 1940   Cancelled treatment:        Pt off Bipap earlier this morning however required ventilation assist and back on presently. Will follow   Royce Macadamia 05/25/2020, 8:59 AM Breck Coons Lonell Face.Ed Nurse, children's [phone] Office [phone]

## 2020-05-26 ENCOUNTER — Inpatient Hospital Stay (HOSPITAL_COMMUNITY): Payer: Medicare Other

## 2020-05-26 DIAGNOSIS — I5033 Acute on chronic diastolic (congestive) heart failure: Secondary | ICD-10-CM | POA: Diagnosis not present

## 2020-05-26 DIAGNOSIS — J9622 Acute and chronic respiratory failure with hypercapnia: Secondary | ICD-10-CM | POA: Diagnosis not present

## 2020-05-26 DIAGNOSIS — J9621 Acute and chronic respiratory failure with hypoxia: Secondary | ICD-10-CM | POA: Diagnosis not present

## 2020-05-26 LAB — BASIC METABOLIC PANEL
Anion gap: 10 (ref 5–15)
Anion gap: 12 (ref 5–15)
BUN: 35 mg/dL — ABNORMAL HIGH (ref 8–23)
BUN: 33 mg/dL — ABNORMAL HIGH (ref 8–23)
CO2: 42 mmol/L — ABNORMAL HIGH (ref 22–32)
CO2: 40 mmol/L — ABNORMAL HIGH (ref 22–32)
Calcium: 9 mg/dL (ref 8.9–10.3)
Calcium: 8.4 mg/dL — ABNORMAL LOW (ref 8.9–10.3)
Chloride: 80 mmol/L — ABNORMAL LOW (ref 98–111)
Chloride: 80 mmol/L — ABNORMAL LOW (ref 98–111)
Creatinine, Ser: 1.45 mg/dL — ABNORMAL HIGH (ref 0.61–1.24)
Creatinine, Ser: 1.37 mg/dL — ABNORMAL HIGH (ref 0.61–1.24)
GFR, Estimated: 49 mL/min — ABNORMAL LOW (ref >60)
GFR, Estimated: 52 mL/min — ABNORMAL LOW (ref >60)
Glucose, Bld: 203 mg/dL — ABNORMAL HIGH (ref 70–99)
Glucose, Bld: 254 mg/dL — ABNORMAL HIGH (ref 70–99)
Potassium: 5.8 mmol/L — ABNORMAL HIGH (ref 3.5–5.1)
Potassium: 4.3 mmol/L (ref 3.5–5.1)
Sodium: 132 mmol/L — ABNORMAL LOW (ref 135–145)
Sodium: 132 mmol/L — ABNORMAL LOW (ref 135–145)

## 2020-05-26 LAB — COMPREHENSIVE METABOLIC PANEL
ALT: 12 U/L (ref 0–44)
AST: 19 U/L (ref 15–41)
Albumin: 3.1 g/dL — ABNORMAL LOW (ref 3.5–5.0)
Alkaline Phosphatase: 67 U/L (ref 38–126)
Anion gap: 9 (ref 5–15)
BUN: 33 mg/dL — ABNORMAL HIGH (ref 8–23)
CO2: 46 mmol/L — ABNORMAL HIGH (ref 22–32)
Calcium: 9.2 mg/dL (ref 8.9–10.3)
Chloride: 82 mmol/L — ABNORMAL LOW (ref 98–111)
Creatinine, Ser: 1.45 mg/dL — ABNORMAL HIGH (ref 0.61–1.24)
GFR, Estimated: 49 mL/min — ABNORMAL LOW (ref >60)
Glucose, Bld: 174 mg/dL — ABNORMAL HIGH (ref 70–99)
Potassium: 4 mmol/L (ref 3.5–5.1)
Sodium: 137 mmol/L (ref 135–145)
Total Bilirubin: 0.4 mg/dL (ref 0.3–1.2)
Total Protein: 7.2 g/dL (ref 6.5–8.1)

## 2020-05-26 LAB — CBC
HCT: 36.7 % — ABNORMAL LOW (ref 39.0–52.0)
Hemoglobin: 10.7 g/dL — ABNORMAL LOW (ref 13.0–17.0)
MCH: 30.1 pg (ref 26.0–34.0)
MCHC: 29.2 g/dL — ABNORMAL LOW (ref 30.0–36.0)
MCV: 103.1 fL — ABNORMAL HIGH (ref 80.0–100.0)
Platelets: 160 10*3/uL (ref 150–400)
RBC: 3.56 MIL/uL — ABNORMAL LOW (ref 4.22–5.81)
RDW: 15.9 % — ABNORMAL HIGH (ref 11.5–15.5)
WBC: 4.7 10*3/uL (ref 4.0–10.5)
nRBC: 0 % (ref 0.0–0.2)

## 2020-05-26 LAB — PROTIME-INR
INR: 2.6 — ABNORMAL HIGH (ref 0.8–1.2)
Prothrombin Time: 26.8 seconds — ABNORMAL HIGH (ref 11.4–15.2)

## 2020-05-26 LAB — COOXEMETRY PANEL
Carboxyhemoglobin: 1.5 % (ref 0.5–1.5)
Methemoglobin: 0.9 % (ref 0.0–1.5)
O2 Saturation: 83.7 %
Total hemoglobin: 10.7 g/dL — ABNORMAL LOW (ref 12.0–16.0)

## 2020-05-26 LAB — GLUCOSE, CAPILLARY
Glucose-Capillary: 134 mg/dL — ABNORMAL HIGH (ref 70–99)
Glucose-Capillary: 177 mg/dL — ABNORMAL HIGH (ref 70–99)
Glucose-Capillary: 236 mg/dL — ABNORMAL HIGH (ref 70–99)
Glucose-Capillary: 142 mg/dL — ABNORMAL HIGH (ref 70–99)

## 2020-05-26 MED ORDER — SPIRONOLACTONE 25 MG PO TABS
50.0000 mg | ORAL_TABLET | Freq: Every day | ORAL | Status: DC
  Administered 2020-05-26 – 2020-06-03 (×9): 50 mg via ORAL
  Filled 2020-05-26 (×9): qty 2

## 2020-05-26 MED ORDER — ORAL CARE MOUTH RINSE
15.0000 mL | Freq: Two times a day (BID) | OROMUCOSAL | Status: DC
  Administered 2020-05-27 – 2020-06-10 (×29): 15 mL via OROMUCOSAL

## 2020-05-26 MED ORDER — CHLORHEXIDINE GLUCONATE 0.12 % MT SOLN
15.0000 mL | Freq: Two times a day (BID) | OROMUCOSAL | Status: DC
  Administered 2020-05-26 – 2020-06-10 (×28): 15 mL via OROMUCOSAL
  Filled 2020-05-26 (×22): qty 15

## 2020-05-26 MED ORDER — SODIUM CHLORIDE 0.9% FLUSH
10.0000 mL | Freq: Two times a day (BID) | INTRAVENOUS | Status: DC
  Administered 2020-05-26: 20 mL
  Administered 2020-05-26: 10 mL
  Administered 2020-05-27: 20 mL
  Administered 2020-05-27 – 2020-05-28 (×2): 10 mL
  Administered 2020-05-28 – 2020-05-29 (×2): 20 mL
  Administered 2020-05-30 – 2020-06-01 (×5): 10 mL
  Administered 2020-06-01: 20 mL
  Administered 2020-06-02 – 2020-06-10 (×17): 10 mL

## 2020-05-26 MED ORDER — OLANZAPINE 5 MG PO TBDP
5.0000 mg | ORAL_TABLET | Freq: Every day | ORAL | Status: DC
  Administered 2020-05-26 – 2020-06-07 (×11): 5 mg via ORAL
  Filled 2020-05-26 (×14): qty 1

## 2020-05-26 MED ORDER — WARFARIN SODIUM 5 MG PO TABS
5.0000 mg | ORAL_TABLET | Freq: Once | ORAL | Status: AC
  Administered 2020-05-26: 5 mg via ORAL
  Filled 2020-05-26: qty 1

## 2020-05-26 MED ORDER — SODIUM CHLORIDE 0.9% FLUSH: 10.0000 mL | INTRAVENOUS | Status: DC | PRN

## 2020-05-26 MED ORDER — POTASSIUM CHLORIDE CRYS ER 20 MEQ PO TBCR
20.0000 meq | EXTENDED_RELEASE_TABLET | Freq: Every day | ORAL | Status: DC
  Administered 2020-05-26 – 2020-05-30 (×5): 20 meq via ORAL
  Filled 2020-05-26 (×5): qty 1

## 2020-05-26 NOTE — Progress Notes (Addendum)
Advanced Heart Failure Rounding Note  PCP-Cardiologist: Nanetta Batty, MD    Patient Profile:    80 y/o male w/ CAD, Afib, Stage III CKD and severe chronic hypoxic/hypercarbic respiratory failure due to OSA/OHS with baseline pCO2 ~ 80. Now with severe RV failure and massive volume overload.   Subjective:    Lasix gtt increased last PM to 20 mg/hr and Diamox increased to 500 bid. PICC line placed overnight, awaiting CVP set up.   - 2.9L in UOP yesterday. Wt not charted yet. Leg edema improving   Scr stable at 1.45. K 4.0. CO2 46.   Ammonia level 61.    Much more alert and clearer today. A&ox3. Very lethargic yesterday. Remains on HFNC 25L/min. BiPAP at night.   SBPs 140s-150s.     Objective:   Weight Range: 111.3 kg Body mass index is 36.24 kg/m.   Vital Signs:   Temp:  [97.8 F (36.6 C)-98.8 F (37.1 C)] 97.9 F (36.6 C) (01/13 0800) Pulse Rate:  [60-86] 73 (01/13 0600) Resp:  [13-27] 19 (01/13 0600) BP: (103-154)/(53-108) 142/74 (01/13 0600) SpO2:  [89 %-100 %] 100 % (01/13 0833) FiO2 (%):  [50 %-80 %] 80 % (01/13 0833) Last BM Date: 05/20/20  Weight change: Filed Weights   05/22/20 0331 05/22/20 1118 05/25/20 0400  Weight: 114.3 kg 110.5 kg 111.3 kg    Intake/Output:   Intake/Output Summary (Last 24 hours) at 05/26/2020 0932 Last data filed at 05/26/2020 0800 Gross per 24 hour  Intake 183.59 ml  Output 3750 ml  Net -3566.41 ml      Physical Exam    General: obese WM No resp difficulty HEENT: Normal  Neck: Supple, thick neck, JVP elevated to ear . Carotids 2+ bilat; no bruits. No lymphadenopathy or thyromegaly appreciated. Cor: PMI nondisplaced. Irregularly irregular rhythm, regular rate. No rubs, gallops or murmurs. Lungs: decreased BS at the bases bilaterally. No wheezing  Abdomen: obese, soft, nontender, nondistended. No hepatosplenomegaly. No bruits or masses. Good bowel sounds. Extremities: No cyanosis, clubbing, rash, 2+ bilateral LEE up  to thighs. + TED hoses + RUE PICC  Neuro: alert and oriented x 3.  Moves all 4 extremities w/o difficulty. Pleasant affect   Telemetry   Afib 70s    EKG    No new EKG to review   Labs    CBC Recent Labs    05/25/20 0329 05/26/20 0245  WBC 5.9 4.7  HGB 9.6* 10.7*  HCT 32.1* 36.7*  MCV 101.3* 103.1*  PLT 179 160   Basic Metabolic Panel Recent Labs    16/10/96 0245 05/26/20 0506  NA 132* 137  K 5.8* 4.0  CL 80* 82*  CO2 42* 46*  GLUCOSE 203* 174*  BUN 35* 33*  CREATININE 1.45* 1.45*  CALCIUM 9.0 9.2   Liver Function Tests Recent Labs    05/26/20 0506  AST 19  ALT 12  ALKPHOS 67  BILITOT 0.4  PROT 7.2  ALBUMIN 3.1*   No results for input(s): LIPASE, AMYLASE in the last 72 hours. Cardiac Enzymes No results for input(s): CKTOTAL, CKMB, CKMBINDEX, TROPONINI in the last 72 hours.  BNP: BNP (last 3 results) Recent Labs    03/14/20 0752 03/22/20 0117 05/20/20 1133  BNP 532.7* 441.2* 618.9*    ProBNP (last 3 results) No results for input(s): PROBNP in the last 8760 hours.   D-Dimer No results for input(s): DDIMER in the last 72 hours. Hemoglobin A1C No results for input(s): HGBA1C in the last 72  hours. Fasting Lipid Panel No results for input(s): CHOL, HDL, LDLCALC, TRIG, CHOLHDL, LDLDIRECT in the last 72 hours. Thyroid Function Tests No results for input(s): TSH, T4TOTAL, T3FREE, THYROIDAB in the last 72 hours.  Invalid input(s): FREET3  Other results:   Imaging    DG CHEST PORT 1 VIEW  Result Date: 05/26/2020 CLINICAL DATA:  PICC line placement.  Shortness of breath. EXAM: PORTABLE CHEST 1 VIEW COMPARISON:  05/23/2020 FINDINGS: Right PICC line terminates at the at low SVC. Midline trachea. Cardiomegaly accentuated by AP portable technique. Atherosclerosis in the transverse aorta. Median sternotomy for prior CABG. Small bilateral pleural effusions, similar. No pneumothorax. Slightly improved aeration. Basilar predominant interstitial and  airspace disease remains. Thoracic spine fixation. IMPRESSION: Right PICC line tip at low SVC. Slightly improved aeration with persistent congestive heart failure, layering bilateral pleural effusions and bibasilar airspace disease. Electronically Signed   By: Jeronimo Greaves M.D.   On: 05/26/2020 07:46     Medications:     Scheduled Medications: . acetaZOLAMIDE  500 mg Oral BID  . allopurinol  100 mg Oral BID  . Chlorhexidine Gluconate Cloth  6 each Topical Daily  . ferrous sulfate  325 mg Oral Q breakfast  . gabapentin  300 mg Oral TID  . hydrALAZINE  100 mg Oral BID  . insulin aspart  0-15 Units Subcutaneous TID WC  . isosorbide mononitrate  60 mg Oral Daily  . OLANZapine zydis  10 mg Oral QHS  . pantoprazole  40 mg Oral BID AC  . polyethylene glycol  17 g Oral BID  . potassium chloride SA  20 mEq Oral Daily  . pravastatin  40 mg Oral QHS  . senna  1 tablet Oral Daily  . sodium chloride flush  10-40 mL Intracatheter Q12H  . sodium chloride flush  3 mL Intravenous Q12H  . spironolactone  50 mg Oral Daily  . warfarin  6 mg Oral q1600  . Warfarin - Pharmacist Dosing Inpatient   Does not apply q1600    Infusions: . sodium chloride    . furosemide (LASIX) 200 mg in dextrose 5% 100 mL (2mg /mL) infusion 20 mg/hr (05/26/20 0836)    PRN Medications: sodium chloride, acetaminophen, ondansetron (ZOFRAN) IV, sodium chloride flush, sodium chloride flush   Assessment/Plan   1. Acute on chronic heart failure with mid-range EF: Suspect ischemic cardiomyopathy but with prominent signs of RV failure. Echo this admission with EF 45-50%, anteroseptal HK, D-shaped septum with PASP 63 mmHg and dilated IVC.  During 11/21 admission, initial echo showed EF 20% with severe RV dysfunction.  Repeat echo later in hospitalization with EF up to 55% and mild RV dysfunction.  Thought to have stress/Takotsubo cardiomyopathy in setting of septic shock/cholecystitis. Remains markedly volume overloaded with  anasarca but improving.  Creatinine has been fairly stable this admission, 1.4 today.  - continue lasix gtt at 20 hr +  acetazolamide 500 bid - now w/ PICC. Will order CVP monitoring. Doubt low output but will check Co-ox. Given RV failure, may benefit from milrinone to aid in diuresis.  - Continue spironolactone 50 mg daily.  - Eventually would like to get him on Jardiance.  - Probably will need RHC when better-diuresed.  - no UNNA boots w/ severe PAD. Continue w/ TED hoses - monitor ammonia levels, mildly elevated at 61 but mentation ok currently   2. Suspect OHS/OSA: Patient has OSA on CPAP, also has been on 2-3 L home oxygen.  Remote smoker.  - continue w/  BiPAP  3. Atrial fibrillation: Chronic, rate is controlled. H/o CVA.  - Continue warfarin.  4. CAD: Anterior MI 1994, CABG 1997 with LIMA-LAD and seq SVG-OM/D.  Had PCI to proximal RCA in 2018.  No chest pain. Looking back, EF has generally been in the 50% range (45-50% on echo this admission). HS-TnI minimally elevated with no trend this admission, ACS is unlikely.  - Continue home statin.  - He is on warfarin so no ASA.  5. PAD: H/o L=>R fem-fem crossover grafting, had to be removed due to infection and had patch angioplasty.  6. AKI on CKD stage 3: Initial creatinine 1.9, now down to 1.4.  Follow with diuresis.  7. H/o acute cholecystitis: Has cholecystostomy tube from 11/21.  8. Acute Hypoxic Respiratory Failure -  has been on 2-3 L home oxygen. Now w/ increased requirements on 25L/min HFNC  - 2/2 a/c CHF + OHS/OSA and ? Underlying COPD - continue diuretics per above - continue supp O2, BiPAP qhs and as needed during the day  - PCCM following  9. GOC: prognosis is poor. CCM planning palliative care meeting w/ family this afternoon.   Length of Stay: [address] [place]  05/26/2020, 9:32 AM  Advanced Heart Failure Team Pager [phone] (M-F; 7a - 4p)  Please contact [facility] for night-coverage after hours (4p -7a  ) and weekends on [email or web address]  Patient seen and examined with the above-signed Advanced Practice Provider and/or Housestaff. I personally reviewed laboratory data, imaging studies and relevant notes. I independently examined the patient and formulated the important aspects of the plan. I have edited the note to reflect any of my changes or salient points. I have personally discussed the plan with the patient and/or family.  More alert today. Sitting up in chair. Remains on lasix gtt. PICC placed. Co-ox ok. CVP 23 (checked with RN personally). Feels bloated. Denies SOB.   General:  Obese male sitting up in chair on FM HEENT: normal Neck: supple.JVP to ear Carotids 2+ bilat; no bruits. No lymphadenopathy or thryomegaly appreciated. Cor: PMI nondisplaced. Regular rate & rhythm. 2/6 TR Lungs: decreased throguhout Abdomen: obese soft, nontender, markedly distended.No bruits or masses. Good bowel sounds. Extremities: no cyanosis, clubbing, rash, 4+ edema Neuro: alert & orientedx3, cranial nerves grossly intact. moves all 4 extremities w/o difficulty. Affect pleasant  He has end-stage OSA/OHS with severe hypoxic/hypercarbic resp failure and end-stage RV failure with massive volume overload. Continue lasix gtt and diamox. Can add metolazone as needed. Agree with involving Palliative Care.  Arvilla Meres, MD  4:02 PM

## 2020-05-26 NOTE — Progress Notes (Signed)
RN called RT stating that pt would like to eat breakfast. RN felt comfortable placing pt on HHFNC 25L 80%.

## 2020-05-26 NOTE — Evaluation (Signed)
Physical Therapy Evaluation Patient Details Name: Isaiah [name] MRN: [id number] DOB: 12-Jul-1940 Today's Date: 05/26/2020   History of Present Illness  80 year old male presented with progressive shortness of breath and hypoxia with bilateral lower extremity swelling on admission.  Admitted per teaching service for acute CHF exacerbation and volume overload.  Morning of 05/22/2020 patient suffered brief respiratory arrest for which PCCM was consulted for further management.  PMHx:  d CHF, DM2. (ropr CVA. afob. PSA. JTM. CAD. CLD stage 3.  Clinical Impression  Pt admitted with/for SOB due to acute CHF exacerbation.  Pt presently needing min to moderate assist for basic mobility with use of the RW to transfer to the recliner.   Pt currently limited functionally due to the problems listed below.  (see problems list.)  Pt will benefit from PT to maximize function and safety to be able to get home safely with available assist of family.     Follow Up Recommendations Home health PT;Supervision/Assistance - 24 hour    Equipment Recommendations   (?need BSC)    Recommendations for Other Services       Precautions / Restrictions Precautions Precautions: Fall      Mobility  Bed Mobility Overal bed mobility: Needs Assistance Bed Mobility: Supine to Sit     Supine to sit: Mod assist     General bed mobility comments: pt assisted LE's off bed, truncal assist up and forward.  minimal scoot assist.    Transfers Overall transfer level: Needs assistance Equipment used: Rolling walker (2 wheeled);None Transfers: Sit to/from [employer] Sit to Stand: Min assist;Mod assist Stand pivot transfers: Min assist;+2 physical assistance       General transfer comment: more assist the lower the surface. general stability assist and help maneuvering the RW  Ambulation/Gait             General Gait Details: NT  Stairs            Wheelchair Mobility    Modified Rankin  (Stroke Patients Only)       Balance Overall balance assessment: Needs assistance Sitting-balance support: Feet supported;No upper extremity supported;Single extremity supported Sitting balance-Leahy Scale: Fair     Standing balance support: Bilateral upper extremity supported;Single extremity supported Standing balance-Leahy Scale: Poor Standing balance comment: reliant on the RW and/or external support                             Pertinent Vitals/Pain Pain Assessment: Faces Faces Pain Scale: No hurt Pain Intervention(s): Monitored during session    Home Living Family/patient expects to be discharged to:: Private residence Living Arrangements: Children Available Help at Discharge: Family;Available 24 hours/day Type of Home: House Home Access: Ramped entrance     Home Layout: Able to live on main level with bedroom/bathroom;Two level;Other (Comment) (per pt it is not the kitchen level) Home Equipment: Walker - 2 wheels;Cane - single point;Shower seat Additional Comments: daughter and granddaughter are RNs, pt lives with dtr who is a Char[employer] fundraiser    Prior Function Level of Independence: Needs assistance   Gait / Transfers Assistance Needed: lately has been at a transfer level mostly, bed to lift chair to [facility].  In recent past (NOV), pt able to ambulate with RW and climb flight of stairs.  ADL's / Homemaking Assistance Needed: Takes sponge baths generally, shower at least 1x/wk.        Hand Dominance   Dominant Hand: Right  Extremity/Trunk Assessment   Upper Extremity Assessment Upper Extremity Assessment: Generalized weakness    Lower Extremity Assessment Lower Extremity Assessment: Generalized weakness       Communication   Communication: No difficulties  Cognition Arousal/Alertness: Awake/alert Behavior During Therapy: WFL for tasks assessed/performed Overall Cognitive Status: No family/caregiver present to determine baseline cognitive functioning                                         General Comments General comments (skin integrity, edema, etc.): On 25 L, FiO2 100%, SpO2 running 94%, HR in the 80's/90's with activity, BP soft.    Exercises     Assessment/Plan    PT Assessment Patient needs continued PT services  PT Problem List Decreased strength;Decreased activity tolerance;Decreased balance;Decreased mobility;Decreased knowledge of use of DME;Cardiopulmonary status limiting activity       PT Treatment Interventions DME instruction;Gait training;Functional mobility training;Therapeutic activities;Balance training;Patient/family education    PT Goals (Current goals can be found in the Care Plan section)  Acute Rehab PT Goals Patient Stated Goal: Like to take pt home. PT Goal Formulation: With patient Time For Goal Achievement: 06/09/20 Potential to Achieve Goals: Fair    Frequency Min 3X/week   Barriers to discharge        Co-evaluation               AM-PAC PT "6 Clicks" Mobility  Outcome Measure Help needed turning from your back to your side while in a flat bed without using bedrails?: A Little Help needed moving from lying on your back to sitting on the side of a flat bed without using bedrails?: A Lot Help needed moving to and from a bed to a chair (including a wheelchair)?: A Little Help needed standing up from a chair using your arms (e.g., wheelchair or bedside chair)?: A Lot Help needed to walk in hospital room?: A Lot Help needed climbing 3-5 steps with a railing? : A Lot 6 Click Score: 14    End of Session   Activity Tolerance: Patient tolerated treatment well;Patient limited by fatigue Patient left: in chair;with call bell/phone within reach;with chair alarm set Nurse Communication: Mobility status PT Visit Diagnosis: Other abnormalities of gait and mobility (R26.89);Muscle weakness (generalized) (M62.81);Difficulty in walking, not elsewhere classified (R26.2)    Time:  3762-8315 PT Time Calculation (min) (ACUTE ONLY): 36 min   Charges:   PT Evaluation $PT Eval Moderate Complexity: 1 Mod PT Treatments $Therapeutic Activity: 8-22 mins        05/26/2020  Jacinto Halim., PT Acute Rehabilitation Services [phone]  (pager) [phone]  (office)  Eliseo Gum Daphnee Preiss 05/26/2020, 3:28 PM

## 2020-05-26 NOTE — Progress Notes (Signed)
ANTICOAGULATION CONSULT NOTE  Pharmacy Consult for warfarin Indication: atrial fibrillation  No Known Allergies  Patient Measurements: Height: 5\' 9"  (175.3 cm) Weight: 111.3 kg (245 lb 6 oz) IBW/kg (Calculated) : 70.7  Vital Signs: Temp: 97.9 F (36.6 C) (01/13 0800) Temp Source: Axillary (01/13 0800) BP: 142/74 (01/13 0600) Pulse Rate: 73 (01/13 0600)  Labs: Recent Labs    05/24/20 0233 05/24/20 0433 05/24/20 0606 05/25/20 0329 05/26/20 0245 05/26/20 0506  HGB 9.8* 11.6*  --  9.6* 10.7*  --   HCT 33.1* 34.0*  --  32.1* 36.7*  --   PLT 184  --   --  179 160  --   LABPROT  --   --  22.9* 23.9* 26.8*  --   INR  --   --  2.1* 2.2* 2.6*  --   CREATININE 1.54*  --   --  1.41* 1.45* 1.45*    Estimated Creatinine Clearance: 50.8 mL/min (A) (by C-G formula based on SCr of 1.45 mg/dL (H)).   Medical History: Past Medical History:  Diagnosis Date  . Atrial fibrillation (HCC)    on Coumadin  . CAD (coronary artery disease)    s/p remote CABG, stent in 2019  . CHF (congestive heart failure) (HCC)   . Dementia (HCC)   . Diabetes mellitus   . DVT (deep venous thrombosis) (HCC)   . Fall at home 10/2015  . Hyperlipidemia   . Hypertension   . Left-sided carotid artery disease (HCC)   . MRSA infection   . Myocardial infarction (HCC)   . Obesity   . Peripheral vascular disease (HCC)   . Sleep apnea   . Stroke (HCC)   . Stroke (HCC)   . Venous insufficiency     Medications:  Medications Prior to Admission  Medication Sig Dispense Refill Last Dose  . acetaminophen (TYLENOL) 500 MG tablet Take 500-1,000 mg by mouth every 6 (six) hours as needed for mild pain or headache.   unk  . allopurinol (ZYLOPRIM) 100 MG tablet TAKE 2 TABLETS BY MOUTH DAILY. (Patient taking differently: Take 100 mg by mouth 2 (two) times daily.) 30 tablet 0 05/19/2020 at Unknown time  . colchicine 0.6 MG tablet Take 0.6 mg by mouth daily as needed (gout attacks).   unk  . ferrous sulfate 325 (65 FE)  MG tablet Take 1 tablet (325 mg total) by mouth 2 (two) times daily with a meal. (Patient taking differently: Take 325 mg by mouth daily with breakfast.) 90 tablet 0 05/19/2020 at Unknown time  . furosemide (LASIX) 40 MG tablet 40 mg daily except for Monday/Wednesday/ and Friday take 40 mg TWICE A DAY. (Patient taking differently: Take 40 mg by mouth in the morning, at noon, and at bedtime.)   05/19/2020 at Unknown time  . gabapentin (NEURONTIN) 300 MG capsule Take 300-600 mg by mouth See admin instructions. Take 300 mg by mouth three times a day and 600 mg at bedtime   05/19/2020 at Unknown time  . glipiZIDE (GLUCOTROL) 5 MG tablet Take 5 mg by mouth 2 (two) times daily before a meal.   05/19/2020 at Unknown time  . hydrALAZINE (APRESOLINE) 100 MG tablet Take 100 mg by mouth 2 (two) times daily.   05/19/2020 at Unknown time  . insulin aspart protamine- aspart (NOVOLOG MIX 70/30) (70-30) 100 UNIT/ML injection Inject 0.5 mLs (50 Units total) into the skin 2 (two) times daily before a meal. 30 mL 0 05/19/2020 at Unknown time  . isosorbide mononitrate (  IMDUR) 60 MG 24 hr tablet Take 1 tablet (60 mg total) by mouth daily. Appointment needed for future refills (Patient taking differently: Take 60 mg by mouth daily.) 30 tablet 0 05/19/2020 at Unknown time  . metolazone (ZAROXOLYN) 2.5 MG tablet Take 2.5 mg by mouth 3 (three) times a week. Monday, Wednesday, Friday   05/18/2020  . Multiple Vitamin (MULTIVITAMIN WITH MINERALS) TABS tablet Take 1 tablet by mouth daily.   05/19/2020 at Unknown time  . nitroGLYCERIN (NITROSTAT) 0.4 MG SL tablet Place 0.4 mg under the tongue every 5 (five) minutes as needed for chest pain.   unk  . pantoprazole (PROTONIX) 40 MG tablet Take 40 mg by mouth 2 (two) times daily before a meal.    05/19/2020 at Unknown time  . potassium chloride (K-DUR) 10 MEQ tablet Take 1 tablet (10 mEq total) by mouth daily. 30 tablet 5 05/19/2020 at Unknown time  . pravastatin (PRAVACHOL) 40 MG tablet Take 40 mg by mouth  at bedtime.    05/19/2020 at Unknown time  . PRESCRIPTION MEDICATION Inhale into the lungs See admin instructions. CPAP- At bedtime and during any time of rest     . QUEtiapine (SEROQUEL) 100 MG tablet Take 100 mg by mouth at bedtime.   05/19/2020 at Unknown time  . sodium chloride flush (NS) 0.9 % SOLN Instill 5 mL daily into percutaneous drain daily x 8 weeks. 56 Syringe 0   . warfarin (COUMADIN) 1 MG tablet Take 1 mg by mouth daily. Taking with the 5mg  = 6mg  daily   05/19/2020 at 1700  . warfarin (COUMADIN) 5 MG tablet Take 5 mg by mouth daily with supper. Taking with the 1mg  = 6mg  daily, unless directed differently by clinic   05/19/2020 at 1700  . docusate sodium (COLACE) 100 MG capsule Take 1 capsule (100 mg total) by mouth 2 (two) times daily. (Patient taking differently: Take 100 mg by mouth daily as needed.) 10 capsule 0 unk  . oxyCODONE (OXY IR/ROXICODONE) 5 MG immediate release tablet Take 1 tablet (5 mg total) by mouth every 4 (four) hours as needed (pain). (Patient not taking: No sig reported) 30 tablet 0 Not Taking at Unknown time    Assessment: 80 year old male admitted for HF exacerbation. History of afib on warfarin, home dose is 6 mg daily.   INR remains therapeutic at 2.6, increase from 2.2 yesterday. Hg 10.7, plt 160. No s/sx of bleeding or infusion issues.   Goal of Therapy:  INR 2-3 Monitor platelets by anticoagulation protocol: Yes   Plan:  Will order warfarin 5 mg daily  Monitor daily INR, CBC, s/sx bleeding  , PharmD, BCCCP Clinical Pharmacist  Phone: [phone] 05/26/2020 9:36 AM  Please check AMION for all [facility] phone numbers After 10:00 PM, call Main Pharmacy [phone]

## 2020-05-26 NOTE — Progress Notes (Signed)
Peripherally Inserted Central Catheter Placement  The IV Nurse has discussed with the patient and/or persons authorized to consent for the patient, the purpose of this procedure and the potential benefits and risks involved with this procedure.  The benefits include less needle sticks, lab draws from the catheter, and the patient may be discharged home with the catheter. Risks include, but not limited to, infection, bleeding, blood clot (thrombus formation), and puncture of an artery; nerve damage and irregular heartbeat and possibility to perform a PICC exchange if needed/ordered by physician.  Alternatives to this procedure were also discussed.  Bard Power PICC patient education guide, fact sheet on infection prevention and patient information card has been provided to patient /or left at bedside.    PICC Placement Documentation  PICC Double Lumen 05/26/20 PICC Right Basilic 41 cm 0 cm (Active)  Indication for Insertion or Continuance of Line Prolonged intravenous therapies 05/26/20 0718  Exposed Catheter (cm) 0 cm 05/26/20 3244  Site Assessment Clean;Dry;Intact 05/26/20 0718  Lumen #1 Status Capped (Central line);Flushed;Blood return noted 05/26/20 0718  Lumen #2 Status Capped (Central line);Flushed;Blood return noted 05/26/20 0718  Dressing Type Transparent;Occlusive;Securing device 05/26/20 0718  Dressing Status Clean;Dry;Intact 05/26/20 0718  Antimicrobial disc in place? Yes 05/26/20 0718  Safety Lock Not Applicable 05/26/20 0718  Line Care Connections checked and tightened 05/26/20 0718  Line Adjustment (NICU/IV Team Only) No 05/26/20 0718  Dressing Intervention New dressing 05/26/20 0718  Dressing Change Due 06/02/20 05/26/20 0718       Burnard Bunting Chenice 05/26/2020, 7:19 AM

## 2020-05-26 NOTE — Evaluation (Signed)
Clinical/Bedside Swallow Evaluation Patient Details  Name: DRELYN PISTILLI MRN: [id number] Date of Birth: 07/01/1940  Today's Date: 05/26/2020 Time: SLP Start Time (ACUTE ONLY): 0859 SLP Stop Time (ACUTE ONLY): 0923 SLP Time Calculation (min) (ACUTE ONLY): 24 min  Past Medical History:  Past Medical History:  Diagnosis Date  . Atrial fibrillation (HCC)    on Coumadin  . CAD (coronary artery disease)    s/p remote CABG, stent in 2019  . CHF (congestive heart failure) (HCC)   . Dementia (HCC)   . Diabetes mellitus   . DVT (deep venous thrombosis) (HCC)   . Fall at home 10/2015  . Hyperlipidemia   . Hypertension   . Left-sided carotid artery disease (HCC)   . MRSA infection   . Myocardial infarction (HCC)   . Obesity   . Peripheral vascular disease (HCC)   . Sleep apnea   . Stroke (HCC)   . Stroke (HCC)   . Venous insufficiency    Past Surgical History:  Past Surgical History:  Procedure Laterality Date  . CORONARY ARTERY BYPASS GRAFT  1997   vein harvest from right leg  . CORONARY STENT INTERVENTION N/A 08/07/2016   Procedure: Coronary Stent Intervention;  Surgeon: Corky Crafts, MD;  Location: [facility] INVASIVE CV LAB;  Service: Cardiovascular;  Laterality: N/A;  RCA  . ESOPHAGOGASTRODUODENOSCOPY N/A 08/12/2016   Procedure: ESOPHAGOGASTRODUODENOSCOPY (EGD);  Surgeon: Carman Ching, MD;  Location: [facility] ENDOSCOPY;  Service: Endoscopy;  Laterality: N/A;  . ESOPHAGOGASTRODUODENOSCOPY (EGD) WITH PROPOFOL Left 03/28/2020   Procedure: ESOPHAGOGASTRODUODENOSCOPY (EGD) WITH PROPOFOL;  Surgeon: Bernette Redbird, MD;  Location: [facility] ENDOSCOPY;  Service: Endoscopy;  Laterality: Left;  . INGUINAL HERNIA REPAIR  1980's   Bilateral,  done in [facility]  . IR PERC CHOLECYSTOSTOMY  03/16/2020  . LEFT HEART CATH AND CORS/GRAFTS ANGIOGRAPHY N/A 08/07/2016   Procedure: Left Heart Cath and Cors/Grafts Angiography;  Surgeon: Corky Crafts, MD;  Location: [facility] INVASIVE CV LAB;  Service:  Cardiovascular;  Laterality: N/A;  . PR VEIN BYPASS GRAFT,AORTO-FEM-POP  1980   FEM-FEM BPG by [name]  . RADIOLOGY WITH ANESTHESIA N/A 03/15/2020   Procedure: IR WITH ANESTHESIA;  Surgeon: Irish Lack, MD;  Location: [facility] OR;  Service: Radiology;  Laterality: N/A;   HPI:  Mr. Danon Lograsso is a 80 y.o admitted with shortness of breath, low sats with a PMHx of type II DM, prior CVA, atrial fibrillation on coumadin, OSA, HLD, HTN, CAD, dementia, CKD stage III. Lives with daughter who provides mild assist, however she recently had a significant back injury in MVA and has not been able to assist. CXR Slightly improved aeration with persistent congestive heart failure,  layering bilateral pleural effusions and bibasilar airspace disease.   Assessment / Plan / Recommendation Clinical Impression  Mr. Mendonsa demonstrates a respiratory based dysphagia s/p CHF. At baseline, vocal quality was mildly wet, + s/s with thin as he attemtps to coordinate respirations with swallows. He intermittently vocalized during mastication and immediately after swallow with increased volume at rate with liquids. When straw removed and slower pace he did not cough, occasional throat clear. Will keep on regular texture, thin liquids, pills whole in puree, rest breaks, NO straws and limit distractions. ST will follow carry over of strategies. SLP Visit Diagnosis: Dysphagia, unspecified (R13.10)    Aspiration Risk  Mild aspiration risk    Diet Recommendation Regular;Thin liquid   Liquid Administration via: Cup;No straw Medication Administration: Whole meds with puree Supervision: Patient able to self feed;Intermittent  supervision to cue for compensatory strategies Compensations: Minimize environmental distractions;Slow rate;Small sips/bites (rest breaks) Postural Changes: Seated upright at 90 degrees    Other  Recommendations Oral Care Recommendations: Oral care BID   Follow up Recommendations None      Frequency  and Duration min 2x/week  2 weeks       Prognosis Prognosis for Safe Diet Advancement: Good Barriers to Reach Goals: Cognitive deficits      Swallow Study   General Date of Onset: 05/22/20 HPI: Mr. Ayren Zumbro is a 80 y.o admitted with shortness of breath, low sats with a PMHx of type II DM, prior CVA, atrial fibrillation on coumadin, OSA, HLD, HTN, CAD, dementia, CKD stage III. Lives with daughter who provides mild assist, however she recently had a significant back injury in MVA and has not been able to assist. CXR Slightly improved aeration with persistent congestive heart failure,  layering bilateral pleural effusions and bibasilar airspace disease. Type of Study: Bedside Swallow Evaluation Previous Swallow Assessment:  (none) Diet Prior to this Study: Regular;Thin liquids Temperature Spikes Noted: No Respiratory Status: Other (comment) (HRNC  80% FiO2, 25 L) History of Recent Intubation: No Behavior/Cognition: Alert;Cooperative;Pleasant mood;Requires cueing Oral Cavity Assessment: Within Functional Limits Oral Care Completed by SLP: No Oral Cavity - Dentition:  (upper partial, adequate lower) Vision: Functional for self-feeding Self-Feeding Abilities: Able to feed self Patient Positioning: Upright in bed Baseline Vocal Quality: Wet Volitional Cough: Weak Volitional Swallow: Able to elicit    Oral/Motor/Sensory Function Overall Oral Motor/Sensory Function: Within functional limits   Ice Chips Ice chips: Not tested   Thin Liquid Thin Liquid: Impaired Presentation: Cup;Straw Pharyngeal  Phase Impairments: Cough - Immediate;Throat Clearing - Delayed    Nectar Thick Nectar Thick Liquid: Not tested   Honey Thick Honey Thick Liquid: Within functional limits   Puree Puree: Not tested   Solid     Solid: Within functional limits      Royce Macadamia 05/26/2020,10:11 AM  Breck Coons Lonell Face.Ed Nurse, children's [phone] Office  [phone]

## 2020-05-26 NOTE — Progress Notes (Signed)
NAME:  Isaiah [name], MRN:  [id number], DOB:  03/16/1941, LOS: 6 ADMISSION DATE:  05/20/2020, CONSULTATION DATE:  05/22/2020 REFERRING MD:  Dr. Antony Contras, CHIEF COMPLAINT:  Respiratory arrest    Brief History:  80 year old male presented with progressive shortness of breath and hypoxia with bilateral lower extremity swelling on admission.  Admitted per teaching service for acute CHF exasperation and volume overload.  Morning of 05/22/2020 patient suffered brief respiratory arrest for which PCCM was consulted for further management  Past Medical History:  Diastolic congestive heart failure, type 2 diabetes, prior CVA, atrial fibrillation on chronic anticoagulation, obstructive sleep apnea, hyperlipidemia, hypertension, CAD, and CKD stage III   Significant Hospital Events:  Admitted 1/7 1/9 critical care consulted for near respiratory arrest placed on BiPAP responded to noninvasive support and diuretics  1/10: Improving oxygen requirements improving 1/11: Has had 2 more isolated episodes of hypoxia with saturations as low as the 40s typically happening after positional change responding well to positive pressure via noninvasive mask Lasix dosing tripled added spironolactone, heart failure team evaluated and started Lasix drip 1/11: Making progress from a fluid volume standpoint but fatigued, still requiring BiPAP.  Overall clinically actually looks worse.  Started on Lasix drip after advanced heart failure team evaluation 1/12: Required BiPAP most of the day 1/13: Now down to 10 L from fluid volume standpoint after adjusting diuretics PICC line placed  Consults:    Procedures:  Echocardiogram 1/9: Anterior septal hypokinesis with LV EF now 45 to 50% with decreased LV function.  Unable to determine diastolic function.  But there was interventricular septal flattening in systole and diastole consistent with right ventricular volume overload.  There is severely elevated pulmonary artery systolic  pressure.  Significant Diagnostic Tests:  CXR 1/7 >Worsening CHF with bilateral effusions left greater than right.  Micro Data:  COVID 1/7 > Negative  RVP 1/7 > negative  Antimicrobials:     Interim History / Subjective:  Feels better.  He is fixated on his lower ankle swelling Objective   Blood pressure (Abnormal) 142/74, pulse 73, temperature 97.9 F (36.6 C), temperature source Axillary, resp. rate 19, height 5\' 9"  (1.753 m), weight 111.3 kg, SpO2 100 %.    FiO2 (%):  [50 %-80 %] 80 %   Intake/Output Summary (Last 24 hours) at 05/26/2020 0933 Last data filed at 05/26/2020 0800 Gross per 24 hour  Intake 183.59 ml  Output 3750 ml  Net -3566.41 ml    Filed Weights   05/22/20 0331 05/22/20 1118 05/25/20 0400  Weight: 114.3 kg 110.5 kg 111.3 kg    Examination: General chronically ill-appearing 80 year old white male is sitting up in bed he is in no acute distress HEENT normocephalic atraumatic no jugular venous distention Pulmonary some scattered rhonchi diminished bases no accessory use currently Cardiac regular irregular Abdomen soft Extremities continues to have diffuse anasarca Neuro intact but does perseverate on certain questions he is generally weak but has no focal deficits GU clear yellow urine  Resolved Hospital Problem list   Brief respiratory arrest 1/9  Assessment & Plan:  Acute decompensated diastolic congestive heart failure, history of hypertension -Most recent echocardiogram on 03/26/2020 with EF of 55 to 60% with regional wall motion abnormalities and mild left ventricular hypertrophy.  Grade 2 diastolic dysfunction seen as well. Appreciate heart failure team evaluating Now down 10 L Plan PICC line placed on 1/12, will get central venous pressure and SCV O2 Continue aggressive diuresis Continue hydralazine and Imdur Additional recommendations per heart failure  team  Acute Hypoxic and hypercapnic Respiratory Failure with pulmonary edema and volume  overload History of OSA -Portable chest x-ray was personally reviewed this morning This demonstrated the following: Continued low lung volumes.  Cardiomegaly.  Improved pulmonary edema when comparing prior film, bibasilar atelectasis and effusions.  Plan Continue IV Lasix  Continue Diamox for now but continue to check daily chemistry Continue spironolactone Continue to alternate BiPAP with supplemental oxygen.  We will see if we can wean him off high flow oxygen today He is a terrible candidate for intubation in the future planning on having goals of care discussion later this afternoon with patient and his children Get him out of bed Continue bronchodilators Encourage spirometry  Acute Kidney Injury  -Patient presented with creatinine 1.90 up from 1.25 04/05/2020, serum creatinine holding in the 1.5-1.65 range Plan Continue to trend serum creatinine Strict intake output Daily weights  Intermittent encephalopathy, acute metabolic Plan We will reduce his Zyprexa to 5 mg as suspect hypercarbia and decreased renal function playing a role in this  Chronic atrial fibrillation -Home medications include Coumadin Plan Continue telemetry Continue warfarin  Hyponatremia -Chart review reveals patient typically has sodium levels on the low end of normal; this is improved with diuresis Plan Daily chemistries  Type 2 diabetes -Home medication includes glipizide and 70/30 injectable insulin Plan Sliding scale  Recent acute cholecystitis status post percutaneous drain placed on 11/3 Plan Continue drainage  Best practice (evaluated daily)  Diet: Heart healthy Pain/Anxiety/Delirium protocol (if indicated): As needed  VAP protocol (if indicated): N/A DVT prophylaxis: Coumadin  GI prophylaxis: PPI Glucose control: SSI, changed to [facility] at bedtime 1/10 Mobility: Bedrest get out of bed 1/10 Disposition: ICU, will reach out to patient's daughter again today on 1/12.  I think we need to  discuss goals of care more in depth particularly as he clearly has declined health Goals of Care:  Last date of multidisciplinary goals of care discussion: Pending Family and staff present: Pending Summary of discussion: Pending Follow up goals of care discussion due: Pending Code Status: Full   Critical care time: 32 minutes      Simonne Martinet ACNP-BC [facility] Pulmonary/Critical Care Pager # [phone] OR # [phone] if no answer

## 2020-05-26 NOTE — Progress Notes (Signed)
Long discussion  With the patient, his daughter, and son Isaiah [name]. This was a multidisciplinary meeting which also included nursing staff in addition to myself at the bedside. We discussed at length patients decline heart function, his declined overall physical function, and a high probability for recurrent heart failure in the context of his progressed cardiac dysfunction. At this point the patient remains hopeful that he will improve, and stores hopefully go home. At the conclusion in regards to goals of care the following decision was made by the patient and his children. At this point he does remain a full code should he have cardiac arrest. He would also want mechanical ventilation and oral intubation should he have respiratory failure. He does indicate he does not want to prolong life support, this would include not wanting tracheostomy. He also indicates that he would not want dialysis. Going forward our hope is that we can temporize his overall functional status by optimizing his cardiac function, I've made it clear to the patient and his family that I believe He has advanced heart failure, and unfortunately in spite of optimal therapy he will likely end up in this situation again in the near future. At this point he remains full code, full scope of medical care with a goal to hopefully get him discharged to home

## 2020-05-27 DIAGNOSIS — N179 Acute kidney failure, unspecified: Secondary | ICD-10-CM | POA: Diagnosis not present

## 2020-05-27 DIAGNOSIS — I5023 Acute on chronic systolic (congestive) heart failure: Secondary | ICD-10-CM | POA: Diagnosis not present

## 2020-05-27 DIAGNOSIS — I5033 Acute on chronic diastolic (congestive) heart failure: Secondary | ICD-10-CM | POA: Diagnosis not present

## 2020-05-27 DIAGNOSIS — J9621 Acute and chronic respiratory failure with hypoxia: Secondary | ICD-10-CM | POA: Diagnosis not present

## 2020-05-27 DIAGNOSIS — J9622 Acute and chronic respiratory failure with hypercapnia: Secondary | ICD-10-CM | POA: Diagnosis not present

## 2020-05-27 LAB — POCT I-STAT 7, (LYTES, BLD GAS, ICA,H+H)
Acid-Base Excess: 21 mmol/L — ABNORMAL HIGH (ref 0.0–2.0)
Acid-Base Excess: 20 mmol/L — ABNORMAL HIGH (ref 0.0–2.0)
Acid-Base Excess: 21 mmol/L — ABNORMAL HIGH (ref 0.0–2.0)
Bicarbonate: 50.4 mmol/L — ABNORMAL HIGH (ref 20.0–28.0)
Bicarbonate: 49.6 mmol/L — ABNORMAL HIGH (ref 20.0–28.0)
Bicarbonate: 50.7 mmol/L — ABNORMAL HIGH (ref 20.0–28.0)
Calcium, Ion: 1.16 mmol/L (ref 1.15–1.40)
Calcium, Ion: 1.18 mmol/L (ref 1.15–1.40)
Calcium, Ion: 1.2 mmol/L (ref 1.15–1.40)
HCT: 33 % — ABNORMAL LOW (ref 39.0–52.0)
HCT: 34 % — ABNORMAL LOW (ref 39.0–52.0)
HCT: 35 % — ABNORMAL LOW (ref 39.0–52.0)
Hemoglobin: 11.2 g/dL — ABNORMAL LOW (ref 13.0–17.0)
Hemoglobin: 11.6 g/dL — ABNORMAL LOW (ref 13.0–17.0)
Hemoglobin: 11.9 g/dL — ABNORMAL LOW (ref 13.0–17.0)
O2 Saturation: 98 %
O2 Saturation: 97 %
O2 Saturation: 97 %
Patient temperature: 96.6
Patient temperature: 97.9
Potassium: 4 mmol/L (ref 3.5–5.1)
Potassium: 4.1 mmol/L (ref 3.5–5.1)
Potassium: 4 mmol/L (ref 3.5–5.1)
Sodium: 132 mmol/L — ABNORMAL LOW (ref 135–145)
Sodium: 132 mmol/L — ABNORMAL LOW (ref 135–145)
Sodium: 133 mmol/L — ABNORMAL LOW (ref 135–145)
TCO2: >50 mmol/L — ABNORMAL HIGH (ref 22–32)
TCO2: >50 mmol/L — ABNORMAL HIGH (ref 22–32)
TCO2: >50 mmol/L — ABNORMAL HIGH (ref 22–32)
pCO2 arterial: 78.8 mmHg (ref 32.0–48.0)
pCO2 arterial: 91.5 mmHg (ref 32.0–48.0)
pCO2 arterial: 85.3 mmHg (ref 32.0–48.0)
pH, Arterial: 7.409 (ref 7.350–7.450)
pH, Arterial: 7.343 — ABNORMAL LOW (ref 7.350–7.450)
pH, Arterial: 7.38 (ref 7.350–7.450)
pO2, Arterial: 117 mmHg — ABNORMAL HIGH (ref 83.0–108.0)
pO2, Arterial: 99 mmHg (ref 83.0–108.0)
pO2, Arterial: 104 mmHg (ref 83.0–108.0)

## 2020-05-27 LAB — BASIC METABOLIC PANEL
Anion gap: 10 (ref 5–15)
Anion gap: 13 (ref 5–15)
BUN: 32 mg/dL — ABNORMAL HIGH (ref 8–23)
BUN: 33 mg/dL — ABNORMAL HIGH (ref 8–23)
CO2: 44 mmol/L — ABNORMAL HIGH (ref 22–32)
CO2: 40 mmol/L — ABNORMAL HIGH (ref 22–32)
Calcium: 9 mg/dL (ref 8.9–10.3)
Calcium: 9.1 mg/dL (ref 8.9–10.3)
Chloride: 83 mmol/L — ABNORMAL LOW (ref 98–111)
Chloride: 81 mmol/L — ABNORMAL LOW (ref 98–111)
Creatinine, Ser: 1.45 mg/dL — ABNORMAL HIGH (ref 0.61–1.24)
Creatinine, Ser: 1.45 mg/dL — ABNORMAL HIGH (ref 0.61–1.24)
GFR, Estimated: 49 mL/min — ABNORMAL LOW (ref >60)
GFR, Estimated: 49 mL/min — ABNORMAL LOW (ref >60)
Glucose, Bld: 119 mg/dL — ABNORMAL HIGH (ref 70–99)
Glucose, Bld: 173 mg/dL — ABNORMAL HIGH (ref 70–99)
Potassium: 4.2 mmol/L (ref 3.5–5.1)
Potassium: 3.9 mmol/L (ref 3.5–5.1)
Sodium: 137 mmol/L (ref 135–145)
Sodium: 134 mmol/L — ABNORMAL LOW (ref 135–145)

## 2020-05-27 LAB — CBC
HCT: 31.9 % — ABNORMAL LOW (ref 39.0–52.0)
Hemoglobin: 9.6 g/dL — ABNORMAL LOW (ref 13.0–17.0)
MCH: 31.1 pg (ref 26.0–34.0)
MCHC: 30.1 g/dL (ref 30.0–36.0)
MCV: 103.2 fL — ABNORMAL HIGH (ref 80.0–100.0)
Platelets: 158 10*3/uL (ref 150–400)
RBC: 3.09 MIL/uL — ABNORMAL LOW (ref 4.22–5.81)
RDW: 15.8 % — ABNORMAL HIGH (ref 11.5–15.5)
WBC: 5.5 10*3/uL (ref 4.0–10.5)
nRBC: 0 % (ref 0.0–0.2)

## 2020-05-27 LAB — PROTIME-INR
INR: 3 — ABNORMAL HIGH (ref 0.8–1.2)
Prothrombin Time: 30.4 seconds — ABNORMAL HIGH (ref 11.4–15.2)

## 2020-05-27 LAB — GLUCOSE, CAPILLARY
Glucose-Capillary: 118 mg/dL — ABNORMAL HIGH (ref 70–99)
Glucose-Capillary: 215 mg/dL — ABNORMAL HIGH (ref 70–99)
Glucose-Capillary: 200 mg/dL — ABNORMAL HIGH (ref 70–99)
Glucose-Capillary: 170 mg/dL — ABNORMAL HIGH (ref 70–99)

## 2020-05-27 LAB — MAGNESIUM: Magnesium: 2 mg/dL (ref 1.7–2.4)

## 2020-05-27 MED ORDER — BISACODYL 10 MG RE SUPP
10.0000 mg | Freq: Every day | RECTAL | Status: DC | PRN
  Administered 2020-05-27: 10 mg via RECTAL
  Filled 2020-05-27: qty 1

## 2020-05-27 MED ORDER — WARFARIN SODIUM 4 MG PO TABS
4.0000 mg | ORAL_TABLET | Freq: Once | ORAL | Status: AC
  Administered 2020-05-27: 4 mg via ORAL
  Filled 2020-05-27: qty 1

## 2020-05-27 NOTE — Progress Notes (Addendum)
Advanced Heart Failure Rounding Note  PCP-Cardiologist: Nanetta Batty, MD    Patient Profile:    80 y/o male w/ CAD, Afib, Stage III CKD and severe chronic hypoxic/hypercarbic respiratory failure due to OSA/OHS with baseline pCO2 ~ 80. Now with severe RV failure and massive volume overload.   Subjective:     CVP 25, -17 lbs since 2 days ago, UOP -5.5L.  Remains on lasix 20 gtt and diamox 500 BID.   Alert and oriented x 3 today, sleepy but wakes to verbal stimuli.  Remains on HHFNC 25L.    Objective:   Weight Range: 103.6 kg Body mass index is 33.73 kg/m.   Vital Signs:   Temp:  [97 F (36.1 C)-98.8 F (37.1 C)] 98.8 F (37.1 C) (01/14 0800) Pulse Rate:  [59-112] 83 (01/14 1100) Resp:  [15-31] 19 (01/14 1100) BP: (112-155)/(49-121) 123/58 (01/14 1100) SpO2:  [90 %-99 %] 96 % (01/14 1117) FiO2 (%):  [50 %-80 %] 60 % (01/14 1117) Weight:  [103.6 kg] 103.6 kg (01/14 0700) Last BM Date: 05/22/20  Weight change: Filed Weights   05/22/20 1118 05/25/20 0400 05/27/20 0700  Weight: 110.5 kg 111.3 kg 103.6 kg    Intake/Output:   Intake/Output Summary (Last 24 hours) at 05/27/2020 1142 Last data filed at 05/27/2020 1100 Gross per 24 hour  Intake 776.79 ml  Output 4900 ml  Net -4123.21 ml      Physical Exam    CVP 25 General:  Sleepy Neck: supple.  Cor: Irregular rate & rhythm. No rubs, gallops or murmurs. Lungs: Diminished lower bases w/ rales right posterior.  Abdomen: Soft, nontender, mildly distended. Hypoactive bowel sounds. Extremities: no cyanosis, clubbing, rash.  +2 Edema present B/L up to thighs. TEDS present.  Neuro: alert & oriented x 3, sleepy.    Telemetry   Afib with rates in 80-90s.  Personally reviewed.    EKG    No new EKG to review   Labs    CBC Recent Labs    05/26/20 0245 05/27/20 0718  WBC 4.7 5.5  HGB 10.7* 9.6*  HCT 36.7* 31.9*  MCV 103.1* 103.2*  PLT 160 158   Basic Metabolic Panel Recent Labs    35/70/17 1454  05/27/20 0718  NA 132* 137  K 4.3 4.2  CL 80* 83*  CO2 40* 44*  GLUCOSE 254* 119*  BUN 33* 32*  CREATININE 1.37* 1.45*  CALCIUM 8.4* 9.0  MG  --  2.0   Liver Function Tests Recent Labs    05/26/20 0506  AST 19  ALT 12  ALKPHOS 67  BILITOT 0.4  PROT 7.2  ALBUMIN 3.1*   No results for input(s): LIPASE, AMYLASE in the last 72 hours. Cardiac Enzymes No results for input(s): CKTOTAL, CKMB, CKMBINDEX, TROPONINI in the last 72 hours.  BNP: BNP (last 3 results) Recent Labs    03/14/20 0752 03/22/20 0117 05/20/20 1133  BNP 532.7* 441.2* 618.9*    ProBNP (last 3 results) No results for input(s): PROBNP in the last 8760 hours.   D-Dimer No results for input(s): DDIMER in the last 72 hours. Hemoglobin A1C No results for input(s): HGBA1C in the last 72 hours. Fasting Lipid Panel No results for input(s): CHOL, HDL, LDLCALC, TRIG, CHOLHDL, LDLDIRECT in the last 72 hours. Thyroid Function Tests No results for input(s): TSH, T4TOTAL, T3FREE, THYROIDAB in the last 72 hours.  Invalid input(s): FREET3  Other results:   Imaging    No results found.   Medications:  Scheduled Medications: . acetaZOLAMIDE  500 mg Oral BID  . allopurinol  100 mg Oral BID  . chlorhexidine  15 mL Mouth Rinse BID  . Chlorhexidine Gluconate Cloth  6 each Topical Daily  . ferrous sulfate  325 mg Oral Q breakfast  . gabapentin  300 mg Oral TID  . hydrALAZINE  100 mg Oral BID  . insulin aspart  0-15 Units Subcutaneous TID WC  . isosorbide mononitrate  60 mg Oral Daily  . mouth rinse  15 mL Mouth Rinse q12n4p  . OLANZapine zydis  5 mg Oral QHS  . pantoprazole  40 mg Oral BID AC  . polyethylene glycol  17 g Oral BID  . potassium chloride SA  20 mEq Oral Daily  . pravastatin  40 mg Oral QHS  . senna  1 tablet Oral Daily  . sodium chloride flush  10-40 mL Intracatheter Q12H  . sodium chloride flush  3 mL Intravenous Q12H  . spironolactone  50 mg Oral Daily  . Warfarin - Pharmacist  Dosing Inpatient   Does not apply q1600    Infusions: . sodium chloride    . furosemide (LASIX) 200 mg in dextrose 5% 100 mL (2mg /mL) infusion 20 mg/hr (05/27/20 1100)    PRN Medications: sodium chloride, acetaminophen, ondansetron (ZOFRAN) IV, sodium chloride flush, sodium chloride flush   Assessment/Plan   1. Acute on chronic heart failure with mid-range EF: Suspect ischemic cardiomyopathy but with prominent signs of RV failure. Echo this admission with EF 45-50%, anteroseptal HK, D-shaped septum with PASP 63 mmHg and dilated IVC.  During 11/21 admission, initial echo showed EF 20% with severe RV dysfunction.  Repeat echo later in hospitalization with EF up to 55% and mild RV dysfunction.  Thought to have stress/Takotsubo cardiomyopathy in setting of septic shock/cholecystitis. Co-ox 84% yesterday.  Cr remains stable at 1.5.  Ammonia 61 but alert and oriented x 3. CVP 25. Remains markedly volume overloaded but improving.  - continue lasix gtt at 20 hr +  acetazolamide 500 bid + spironolactone 50 mg daily.  Continue to monitor CVP.  Hold off on repeat co-ox.  - Eventually would like to get him on Jardiance.  - Probably will need RHC when better-diuresed.  - no UNNA boots w/ severe PAD. Continue w/ TED hoses  2. Suspect OHS/OSA: Patient has OSA on CPAP, also has been on 2-3 L home oxygen.  Remote smoker.  - continue w/ BiPAP HS and PRN.  3. Atrial fibrillation: Chronic, rate is controlled. H/o CVA.  - Continue warfarin, INR therapeutic.  4. CAD: Anterior MI 1994, CABG 1997 with LIMA-LAD and seq SVG-OM/D.  Had PCI to proximal RCA in 2018.  No chest pain. EF has generally been in the 50% range (45-50% on echo this admission). HS-TnI minimally elevated with no trend this admission, ACS is unlikely.  - Continue home statin.  - He is on warfarin so no ASA.  5. PAD: H/o L=>R fem-fem crossover grafting, had to be removed due to infection and had patch angioplasty.  6. AKI on CKD stage 3: Initial  creatinine 1.9, now down to 1.5.  Follow with diuresis.  7. H/o acute cholecystitis: Has cholecystostomy tube from 11/21.  8. Acute on Chronic Hypoxic Respiratory Failure 2/2 a/c CHF + OHS/OSA and ? Underlying COPD.  Has been on 2-3 L home oxygen.  - Now w/ increased requirements on 25L/min HHFNC  - continue diuretics per above - continue supp O2, BiPAP qhs and as needed during the  day  - PCCM following  9. GOC: prognosis is poor. CCM & palliative care family meeting, continue full support with goal to get him discharged to home.   Length of Stay: 7  Terance Ice, NP  05/27/2020, 11:42 AM  Advanced Heart Failure Team Pager [phone] (M-F; 7a - 4p)  Please contact [facility] for night-coverage after hours (4p -7a ) and weekends on [email or web address]   Patient seen and examined with the above-signed Advanced Practice Provider and/or Housestaff. I personally reviewed laboratory data, imaging studies and relevant notes. I independently examined the patient and formulated the important aspects of the plan. I have edited the note to reflect any of my changes or salient points. I have personally discussed the plan with the patient and/or family.  Continues to diurese but CVP still ~25. Sat in chair most of the day but upon my arrival was back in bed on HFNC and became nearly obtunded. Placed back on BIPAP.   Stat ABG done which was similar to baseline. 7.34/92/99/97%  General:  Obese male sitting up in bed. Very lethargic HEENT: normal + BIPAP Neck: supple. JVP to earCarotids 2+ bilat; no bruits. No lymphadenopathy or thryomegaly appreciated. Cor: PMI nondisplaced. Regular rate & rhythm. 2/6 TR Lungs: coarse. Decreased throguhout Abdomen: markedly obese. DistendedNo hepatosplenomegaly. No bruits or masses. Good bowel sounds. Extremities: no cyanosis, clubbing, rash, 2-3+ edema Neuro: lethargic. Minimally arousable  He has end-stage respiratory failure and cor pulmonale due to OSA/OHS. He is  diuresing but remains markedly volume overloaded. He became obtunded shortly before I walked in the room. Placed back on BIPAP and ABG obtained with lillte change from baseline pCO2 91. Will continue CPAP and IV diuresis. I think Hospice is the only durable option for him.   CRITICAL CARE Performed by: Arvilla Meres  Total critical care time: 35 minutes  Critical care time was exclusive of separately billable procedures and treating other patients.  Critical care was necessary to treat or prevent imminent or life-threatening deterioration.  Critical care was time spent personally by me (independent of midlevel providers or residents) on the following activities: development of treatment plan with patient and/or surrogate as well as nursing, discussions with consultants, evaluation of patient's response to treatment, examination of patient, obtaining history from patient or surrogate, ordering and performing treatments and interventions, ordering and review of laboratory studies, ordering and review of radiographic studies, pulse oximetry and re-evaluation of patient's condition.  Arvilla Meres, MD  6:49 PM

## 2020-05-27 NOTE — Progress Notes (Signed)
Patient only responding to pain and unwilling to open eyes. Dr. Gala Romney at bedside and ordered ABG. Also paged Anders Simmonds, NP.

## 2020-05-27 NOTE — Progress Notes (Signed)
ANTICOAGULATION CONSULT NOTE  Pharmacy Consult for warfarin Indication: atrial fibrillation  No Known Allergies  Patient Measurements: Height: 5\' 9"  (175.3 cm) Weight: 103.6 kg (228 lb 6.3 oz) IBW/kg (Calculated) : 70.7  Vital Signs: Temp: 97.9 F (36.6 C) (01/14 1200) Temp Source: Oral (01/14 1200) BP: 119/65 (01/14 1400) Pulse Rate: 140 (01/14 1400)  Labs: Recent Labs    05/25/20 0329 05/26/20 0245 05/26/20 0506 05/26/20 1454 05/27/20 0718  HGB 9.6* 10.7*  --   --  9.6*  HCT 32.1* 36.7*  --   --  31.9*  PLT 179 160  --   --  158  LABPROT 23.9* 26.8*  --   --  30.4*  INR 2.2* 2.6*  --   --  3.0*  CREATININE 1.41* 1.45* 1.45* 1.37* 1.45*    Estimated Creatinine Clearance: 49 mL/min (A) (by C-G formula based on SCr of 1.45 mg/dL (H)).   Medical History: Past Medical History:  Diagnosis Date  . Atrial fibrillation (HCC)    on Coumadin  . CAD (coronary artery disease)    s/p remote CABG, stent in 2019  . CHF (congestive heart failure) (HCC)   . Dementia (HCC)   . Diabetes mellitus   . DVT (deep venous thrombosis) (HCC)   . Fall at home 10/2015  . Hyperlipidemia   . Hypertension   . Left-sided carotid artery disease (HCC)   . MRSA infection   . Myocardial infarction (HCC)   . Obesity   . Peripheral vascular disease (HCC)   . Sleep apnea   . Stroke (HCC)   . Stroke (HCC)   . Venous insufficiency     Medications:  Medications Prior to Admission  Medication Sig Dispense Refill Last Dose  . acetaminophen (TYLENOL) 500 MG tablet Take 500-1,000 mg by mouth every 6 (six) hours as needed for mild pain or headache.   unk  . allopurinol (ZYLOPRIM) 100 MG tablet TAKE 2 TABLETS BY MOUTH DAILY. (Patient taking differently: Take 100 mg by mouth 2 (two) times daily.) 30 tablet 0 05/19/2020 at Unknown time  . colchicine 0.6 MG tablet Take 0.6 mg by mouth daily as needed (gout attacks).   unk  . ferrous sulfate 325 (65 FE) MG tablet Take 1 tablet (325 mg total) by mouth  2 (two) times daily with a meal. (Patient taking differently: Take 325 mg by mouth daily with breakfast.) 90 tablet 0 05/19/2020 at Unknown time  . furosemide (LASIX) 40 MG tablet 40 mg daily except for Monday/Wednesday/ and Friday take 40 mg TWICE A DAY. (Patient taking differently: Take 40 mg by mouth in the morning, at noon, and at bedtime.)   05/19/2020 at Unknown time  . gabapentin (NEURONTIN) 300 MG capsule Take 300-600 mg by mouth See admin instructions. Take 300 mg by mouth three times a day and 600 mg at bedtime   05/19/2020 at Unknown time  . glipiZIDE (GLUCOTROL) 5 MG tablet Take 5 mg by mouth 2 (two) times daily before a meal.   05/19/2020 at Unknown time  . hydrALAZINE (APRESOLINE) 100 MG tablet Take 100 mg by mouth 2 (two) times daily.   05/19/2020 at Unknown time  . insulin aspart protamine- aspart (NOVOLOG MIX 70/30) (70-30) 100 UNIT/ML injection Inject 0.5 mLs (50 Units total) into the skin 2 (two) times daily before a meal. 30 mL 0 05/19/2020 at Unknown time  . isosorbide mononitrate (IMDUR) 60 MG 24 hr tablet Take 1 tablet (60 mg total) by mouth daily. Appointment needed for  future refills (Patient taking differently: Take 60 mg by mouth daily.) 30 tablet 0 05/19/2020 at Unknown time  . metolazone (ZAROXOLYN) 2.5 MG tablet Take 2.5 mg by mouth 3 (three) times a week. Monday, Wednesday, Friday   05/18/2020  . Multiple Vitamin (MULTIVITAMIN WITH MINERALS) TABS tablet Take 1 tablet by mouth daily.   05/19/2020 at Unknown time  . nitroGLYCERIN (NITROSTAT) 0.4 MG SL tablet Place 0.4 mg under the tongue every 5 (five) minutes as needed for chest pain.   unk  . pantoprazole (PROTONIX) 40 MG tablet Take 40 mg by mouth 2 (two) times daily before a meal.    05/19/2020 at Unknown time  . potassium chloride (K-DUR) 10 MEQ tablet Take 1 tablet (10 mEq total) by mouth daily. 30 tablet 5 05/19/2020 at Unknown time  . pravastatin (PRAVACHOL) 40 MG tablet Take 40 mg by mouth at bedtime.    05/19/2020 at Unknown time  .  PRESCRIPTION MEDICATION Inhale into the lungs See admin instructions. CPAP- At bedtime and during any time of rest     . QUEtiapine (SEROQUEL) 100 MG tablet Take 100 mg by mouth at bedtime.   05/19/2020 at Unknown time  . sodium chloride flush (NS) 0.9 % SOLN Instill 5 mL daily into percutaneous drain daily x 8 weeks. 56 Syringe 0   . warfarin (COUMADIN) 1 MG tablet Take 1 mg by mouth daily. Taking with the 5mg  = 6mg  daily   05/19/2020 at 1700  . warfarin (COUMADIN) 5 MG tablet Take 5 mg by mouth daily with supper. Taking with the 1mg  = 6mg  daily, unless directed differently by clinic   05/19/2020 at 1700  . docusate sodium (COLACE) 100 MG capsule Take 1 capsule (100 mg total) by mouth 2 (two) times daily. (Patient taking differently: Take 100 mg by mouth daily as needed.) 10 capsule 0 unk  . oxyCODONE (OXY IR/ROXICODONE) 5 MG immediate release tablet Take 1 tablet (5 mg total) by mouth every 4 (four) hours as needed (pain). (Patient not taking: No sig reported) 30 tablet 0 Not Taking at Unknown time    Assessment: 80 year old male admitted for HF exacerbation. History of afib on warfarin, home dose is 6 mg daily.   INR remains at top end of therapeutic range (3). Hg 9.6, plt 158. No s/sx of bleeding or infusion issues.   Goal of Therapy:  INR 2-3 Monitor platelets by anticoagulation protocol: Yes   Plan:  Warfarin 4 mg x 1 today. Monitor daily INR, CBC, s/sx bleeding  , , [facility] Clinical Pharmacist  05/27/2020 2:41 PM   [facility] [place] pharmacy phone numbers are listed on [email or web address]

## 2020-05-27 NOTE — Progress Notes (Signed)
Critical care event note.  Called by Elink to evaluate the patient.  The patient had become more lethargic after very briefly being taken off BiPAP to take his oral medications.  Once back on BiPAP the patient now is able to follow simple commands but remains lethargic.  Blood sugar 178 ABG: 7.38/85/104  Breath sounds: Bilaterally clear in all lung fields but significantly diminished.  No significant rales. BiPAP 16/5 Neuro: Lethargic but awakens to follow commands x4 extremities.  Nods appropriately to questions.  Acute on chronic respiratory failure with generalized fatigue. Decompensated heart failure  Plan: We will maintain the patient on BiPAP 16/5.  The patient remains at high risk for needing to be intubated but does not need to be intubated at this time. Send BMP and ionized calcium now.  Patient is having intermittent episodes of bigeminy. Discussed plan with nursing staff and respiratory staff.

## 2020-05-27 NOTE — Progress Notes (Signed)
Critical ABG results were given to Dr. Gala Romney.

## 2020-05-27 NOTE — Progress Notes (Signed)
   05/27/20 0813  BiPAP/CPAP/SIPAP  BiPAP/CPAP/SIPAP Pt Type Adult (Standby,pt is on HFNC 70%, 25 L, 99%)

## 2020-05-27 NOTE — Progress Notes (Signed)
Found patient extremely lethargic and unarousable. Patient was previously awake and talking with nurses and his son. Unable to arouse patient with vigorous shaking and sternal rub. Grimace to nipple pinching. Elink called and doctor Ogan on, ground team and respiratory dispatched to bedside.

## 2020-05-27 NOTE — Progress Notes (Signed)
  Speech Language Pathology Treatment: Dysphagia  Patient Details Name: Isaiah [name] MRN: [id number] DOB: 1941-04-27 Today's Date: 05/27/2020 Time: 3435-6861 SLP Time Calculation (min) (ACUTE ONLY): 8 min  Assessment / Plan / Recommendation Clinical Impression  Pt looks better today, eating lunch when therapist arrived without congestion or labored respirations. He was not verbalizing while masticating and did not exhibit indications of airway intrusion with trial use of straws. Better able to coordinate respirations and swallow. Continue regular, thin, straws allowed and ST to sign off at this time.    HPI HPI: Mr. Tyvion Edmondson is a 80 y.o admitted with shortness of breath, low sats with a PMHx of type II DM, prior CVA, atrial fibrillation on coumadin, OSA, HLD, HTN, CAD, dementia, CKD stage III. Lives with daughter who provides mild assist, however she recently had a significant back injury in MVA and has not been able to assist. CXR Slightly improved aeration with persistent congestive heart failure,  layering bilateral pleural effusions and bibasilar airspace disease.      SLP Plan  All goals met;Discharge SLP treatment due to (comment)       Recommendations  Diet recommendations: Regular;Thin liquid Liquids provided via: Cup;Straw Medication Administration: Whole meds with puree Supervision: Patient able to self feed Compensations: Minimize environmental distractions;Slow rate;Small sips/bites Postural Changes and/or Swallow Maneuvers: Seated upright 90 degrees                Oral Care Recommendations: Oral care BID Follow up Recommendations: None SLP Visit Diagnosis: Dysphagia, unspecified (R13.10) Plan: All goals met;Discharge SLP treatment due to (comment)                      Houston Siren 05/27/2020, 2:46 PM [name] Colvin Caroli.Ed Risk analyst [phone] Office [phone]

## 2020-05-27 NOTE — Progress Notes (Signed)
eLink Physician-Brief Progress Note Patient Name: [name] DOB: [birth year] MRN: [id number]   Date of Service  05/27/2020  HPI/Events of Note  Extreme somnolence after his BIPAP was taken off to give him scheduled medications, he remains somnolent even after BIPAP replaced.  eICU Interventions  Stat ABG ordered, PCCM ground crew requested to come and evaluate him for possible intubation.        Migdalia Dk 05/27/2020, 9:44 PM

## 2020-05-27 NOTE — Progress Notes (Signed)
   05/27/20 1830  BiPAP/CPAP/SIPAP  BiPAP/CPAP/SIPAP Pt Type Adult (Pt was placed on bipap by RN around 1630, ABG drawn @ 1818, pt remains on bipap at this time, compensated resp. acidosis.)  Mask Type Full face mask  Mask Size Large  Set Rate 16 breaths/min  Respiratory Rate 30 breaths/min  IPAP 16 cmH20  EPAP 6 cmH2O  Oxygen Percent 50 %  Flow Rate 0 lpm  Minute Ventilation 12.3  Leak 6  Peak Inspiratory Pressure (PIP) 17  Tidal Volume (Vt) 428  BiPAP/CPAP/SIPAP BiPAP  Patient Home Equipment No  Auto Titrate No  Press High Alarm 30 cmH2O  Press Low Alarm 5 cmH2O  Nasal massage performed No (comment)  BiPAP/CPAP /SiPAP Vitals  Pulse Rate 68  Resp (!) 30  SpO2 96 %  Bilateral Breath Sounds Diminished;Rales  MEWS Score/Color  MEWS Score 2  MEWS Score Color Yellow

## 2020-05-27 NOTE — Progress Notes (Signed)
Patient waking up more. Alert to self and place. Will continue to monitor closely.

## 2020-05-27 NOTE — Progress Notes (Signed)
NAME:  Isaiah [name], MRN:  [id number], DOB:  [birth year], LOS: 7 ADMISSION DATE:  05/20/2020, CONSULTATION DATE:  05/22/2020 REFERRING MD:  Dr. Antony Contras, CHIEF COMPLAINT:  Respiratory arrest    Brief History:  80 year old male presented with progressive shortness of breath and hypoxia with bilateral lower extremity swelling on admission.  Admitted per teaching service for acute CHF exasperation and volume overload.  Morning of 05/22/2020 patient suffered brief respiratory arrest for which PCCM was consulted for further management  Past Medical History:  Diastolic congestive heart failure, type 2 diabetes, prior CVA, atrial fibrillation on chronic anticoagulation, obstructive sleep apnea, hyperlipidemia, hypertension, CAD, and CKD stage III   Significant Hospital Events:  Admitted 1/7 1/9 critical care consulted for near respiratory arrest placed on BiPAP responded to noninvasive support and diuretics  1/10: Improving oxygen requirements improving 1/11: Has had 2 more isolated episodes of hypoxia with saturations as low as the 40s typically happening after positional change responding well to positive pressure via noninvasive mask Lasix dosing tripled added spironolactone, heart failure team evaluated and started Lasix drip 1/11: Making progress from a fluid volume standpoint but fatigued, still requiring BiPAP.  Overall clinically actually looks worse.  Started on Lasix drip after advanced heart failure team evaluation 1/12: Required BiPAP most of the day 1/13: Now down to 10 L from fluid volume standpoint after adjusting diuretics PICC line placed.  Goals of care discussion had with the patient, his daughter and son.  They still desire full aggressive scope of care including intubation, ventilation, and CPR should the situation arise.  They do however acknowledge that the patient would not want prolonged mechanical ventilation, also would not be interested in dialysis.  Consults:    Procedures:   Echocardiogram 1/9: Anterior septal hypokinesis with LV EF now 45 to 50% with decreased LV function.  Unable to determine diastolic function.  But there was interventricular septal flattening in systole and diastole consistent with right ventricular volume overload.  There is severely elevated pulmonary artery systolic pressure.  Significant Diagnostic Tests:  CXR 1/7 >Worsening CHF with bilateral effusions left greater than right.  Micro Data:  COVID 1/7 > Negative  RVP 1/7 > negative  Antimicrobials:     Interim History / Subjective:  Slowly improving Objective   Blood pressure (Abnormal) 134/114, pulse 82, temperature 98.7 F (37.1 C), temperature source Axillary, resp. rate (Abnormal) 24, height 5\' 9"  (1.753 m), weight 103.6 kg, SpO2 97 %. CVP:  [13 mmHg-31 mmHg] 21 mmHg  FiO2 (%):  [50 %-80 %] 50 %   Intake/Output Summary (Last 24 hours) at 05/27/2020 05/29/2020 Last data filed at 05/27/2020 0737 Gross per 24 hour  Intake 1087.12 ml  Output 5100 ml  Net -4012.88 ml  CVP:  [13 mmHg-31 mmHg] 21 mmHg Filed Weights   05/22/20 1118 05/25/20 0400 05/27/20 0700  Weight: 110.5 kg 111.3 kg 103.6 kg    Examination: General this is a 80 year old white male is currently sitting in bed following his morning breakfast he is in no acute distress HEENT normocephalic atraumatic no jugular venous distention.  Pressure relief dressing over bridge of nose Pulmonary: Diminished in the bases.  Currently 70% heated high flow at 25 L no accessory use but still gets occasional shortness of breath with minimal exertion Cardiac: Regular irregular Abdomen: Obese, distended, percutaneous drain is unremarkable Extremities: Continues to have pitting edema bilaterally.  This is slowly improving day by day compression socks are in place Neuro awake oriented no focal deficits are  appreciated  Resolved Hospital Problem list   Brief respiratory arrest 1/9  Assessment & Plan:  Acute decompensated diastolic  congestive heart failure, history of hypertension -Most recent echocardiogram on 03/26/2020 with EF of 55 to 60% with regional wall motion abnormalities and mild left ventricular hypertrophy.  Grade 2 diastolic dysfunction seen as well. Appreciate heart failure team evaluating Now down 14.4 liters  Plan No change in hydralazine or Imdur Diuretics addressed below Additional recs per HF  Acute Hypoxic and hypercapnic Respiratory Failure with pulmonary edema and volume overload History of OSA Plan Cont lasix gtt OK to cont diamox but need to follow daily bicarb Cont spironolactone  Wean oxygen BIPAP at HS and PRN IS  Flutter Mobilize Pulse ox    Acute Kidney Injury  -Patient presented with creatinine 1.90 up from 1.25 04/05/2020, serum creatinine holding in the 1.5-1.65 range Plan Daily weight Cont lasix gtt as tolerated Daily chem Strict I&O Renal dose meds  Intermittent encephalopathy, acute metabolic -reduced zyprexa dosing 1/13 Plan Supportive care BIPAP for fatigue-->typically gets more confused as fatigued but this resolves w/ BIPAP  Chronic atrial fibrillation -Home medications include Coumadin Plan Tele and warfarin   Hyponatremia -Chart review reveals patient typically has sodium levels on the low end of normal; this is improved with diuresis Plan Daily chem especially w/ IV lasix gtt  Type 2 diabetes -Home medication includes glipizide and 70/30 injectable insulin Plan ssi  Recent acute cholecystitis status post percutaneous drain placed on 11/3 Plan Cont trend output   Best practice (evaluated daily)  Diet: Heart healthy Pain/Anxiety/Delirium protocol (if indicated): As needed  VAP protocol (if indicated): N/A DVT prophylaxis: Coumadin  GI prophylaxis: PPI Glucose control: SSI, changed to AC at bedtime 1/10 Mobility: Bedrest get out of bed 1/10 Disposition: we will continue monitoring in the intensive care if we can get him to less than 50%  heated high flow I think we can move him out Goals of Care:  Last date of multidisciplinary goals of care discussion: This was completed on 1/13, this included nurse practitioner and bedside nursing staff in addition to family listed below Family and staff present: [name], his daughter and Emmanuel Junior his son in addition to the patient himself who was able to participate Summary of discussion: The patient, and his family continue to desire full scope of medical care this would include CPR should he suffer a cardiac arrest.  They do however concede that he would not want prolonged life support, tracheostomy, or hemodialysis. Follow up goals of care discussion due: 1/20 Code Status: Full   Critical care time: 31 minutes      Simonne Martinet ACNP-BC [facility] Pulmonary/Critical Care Pager # [phone] OR # [phone] if no answer

## 2020-05-28 DIAGNOSIS — N179 Acute kidney failure, unspecified: Secondary | ICD-10-CM | POA: Diagnosis not present

## 2020-05-28 DIAGNOSIS — J9622 Acute and chronic respiratory failure with hypercapnia: Secondary | ICD-10-CM | POA: Diagnosis not present

## 2020-05-28 DIAGNOSIS — I5033 Acute on chronic diastolic (congestive) heart failure: Secondary | ICD-10-CM

## 2020-05-28 DIAGNOSIS — J9621 Acute and chronic respiratory failure with hypoxia: Secondary | ICD-10-CM | POA: Diagnosis not present

## 2020-05-28 LAB — CBC
HCT: 30.6 % — ABNORMAL LOW (ref 39.0–52.0)
Hemoglobin: 9.2 g/dL — ABNORMAL LOW (ref 13.0–17.0)
MCH: 30.6 pg (ref 26.0–34.0)
MCHC: 30.1 g/dL (ref 30.0–36.0)
MCV: 101.7 fL — ABNORMAL HIGH (ref 80.0–100.0)
Platelets: 167 10*3/uL (ref 150–400)
RBC: 3.01 MIL/uL — ABNORMAL LOW (ref 4.22–5.81)
RDW: 15.7 % — ABNORMAL HIGH (ref 11.5–15.5)
WBC: 6 10*3/uL (ref 4.0–10.5)
nRBC: 0 % (ref 0.0–0.2)

## 2020-05-28 LAB — PROTIME-INR
INR: 3 — ABNORMAL HIGH (ref 0.8–1.2)
Prothrombin Time: 30.2 seconds — ABNORMAL HIGH (ref 11.4–15.2)

## 2020-05-28 LAB — GLUCOSE, CAPILLARY
Glucose-Capillary: 113 mg/dL — ABNORMAL HIGH (ref 70–99)
Glucose-Capillary: 177 mg/dL — ABNORMAL HIGH (ref 70–99)
Glucose-Capillary: 229 mg/dL — ABNORMAL HIGH (ref 70–99)
Glucose-Capillary: 143 mg/dL — ABNORMAL HIGH (ref 70–99)

## 2020-05-28 LAB — BASIC METABOLIC PANEL
Anion gap: 11 (ref 5–15)
BUN: 31 mg/dL — ABNORMAL HIGH (ref 8–23)
CO2: 42 mmol/L — ABNORMAL HIGH (ref 22–32)
Calcium: 9.1 mg/dL (ref 8.9–10.3)
Chloride: 83 mmol/L — ABNORMAL LOW (ref 98–111)
Creatinine, Ser: 1.49 mg/dL — ABNORMAL HIGH (ref 0.61–1.24)
GFR, Estimated: 47 mL/min — ABNORMAL LOW (ref >60)
Glucose, Bld: 128 mg/dL — ABNORMAL HIGH (ref 70–99)
Potassium: 3.8 mmol/L (ref 3.5–5.1)
Sodium: 136 mmol/L (ref 135–145)

## 2020-05-28 MED ORDER — POTASSIUM CHLORIDE CRYS ER 20 MEQ PO TBCR
40.0000 meq | EXTENDED_RELEASE_TABLET | Freq: Once | ORAL | Status: AC
  Administered 2020-05-28: 40 meq via ORAL
  Filled 2020-05-28: qty 2

## 2020-05-28 MED ORDER — GABAPENTIN 100 MG PO CAPS
100.0000 mg | ORAL_CAPSULE | Freq: Three times a day (TID) | ORAL | Status: DC
  Administered 2020-05-28 – 2020-06-10 (×39): 100 mg via ORAL
  Filled 2020-05-28 (×39): qty 1

## 2020-05-28 MED ORDER — WARFARIN SODIUM 3 MG PO TABS
3.0000 mg | ORAL_TABLET | Freq: Once | ORAL | Status: AC
  Administered 2020-05-28: 3 mg via ORAL
  Filled 2020-05-28: qty 1

## 2020-05-28 MED ORDER — METOLAZONE 5 MG PO TABS
5.0000 mg | ORAL_TABLET | Freq: Two times a day (BID) | ORAL | Status: AC
  Administered 2020-05-28 (×2): 5 mg via ORAL
  Filled 2020-05-28 (×2): qty 1

## 2020-05-28 NOTE — Progress Notes (Signed)
NAME:  Isaiah [name], MRN:  [id number], DOB:  Feb 21, 1941, LOS: 8 ADMISSION DATE:  05/20/2020, CONSULTATION DATE:  05/22/2020 REFERRING MD:  Dr. Antony Contras, CHIEF COMPLAINT:  Respiratory arrest    Brief History:  80 year old male presented with progressive shortness of breath and hypoxia with bilateral lower extremity swelling on admission.  Admitted per teaching service for acute CHF exasperation and volume overload.  Morning of 05/22/2020 patient suffered brief respiratory arrest for which PCCM was consulted for further management  Past Medical History:  Diastolic congestive heart failure, type 2 diabetes, prior CVA, atrial fibrillation on chronic anticoagulation, obstructive sleep apnea, hyperlipidemia, hypertension, CAD, and CKD stage III   Significant Hospital Events:  Admitted 1/7 1/9 critical care consulted for near respiratory arrest placed on BiPAP responded to noninvasive support and diuretics  1/10: Improving oxygen requirements improving 1/11: Has had 2 more isolated episodes of hypoxia with saturations as low as the 40s typically happening after positional change responding well to positive pressure via noninvasive mask Lasix dosing tripled added spironolactone, heart failure team evaluated and started Lasix drip 1/11: Making progress from a fluid volume standpoint but fatigued, still requiring BiPAP.  Overall clinically actually looks worse.  Started on Lasix drip after advanced heart failure team evaluation 1/12: Required BiPAP most of the day 1/13: Now down to 10 L from fluid volume standpoint after adjusting diuretics PICC line placed.  Goals of care discussion had with the patient, his daughter and son.  They still desire full aggressive scope of care including intubation, ventilation, and CPR should the situation arise.  They do however acknowledge that the patient would not want prolonged mechanical ventilation, also would not be interested in dialysis.  Consults:    Procedures:   Echocardiogram 1/9: Anterior septal hypokinesis with LV EF now 45 to 50% with decreased LV function.  Unable to determine diastolic function.  But there was interventricular septal flattening in systole and diastole consistent with right ventricular volume overload.  There is severely elevated pulmonary artery systolic pressure.  Significant Diagnostic Tests:  CXR 1/7 >Worsening CHF with bilateral effusions left greater than right.  Micro Data:  COVID 1/7 > Negative  RVP 1/7 > negative  Antimicrobials:     Interim History / Subjective:  Overnight required bipap for altered mental status. Remains on continuous IV lasix with good UOP however CVP >35. This morning awake and alert on heated high flow. Objective   Blood pressure 137/69, pulse 80, temperature 99.2 F (37.3 C), temperature source Axillary, resp. rate 18, height 5\' 9"  (1.753 m), weight 104 kg, SpO2 98 %. CVP:  [10 mmHg-37 mmHg] 31 mmHg  FiO2 (%):  [30 %-80 %] 80 %   Intake/Output Summary (Last 24 hours) at 05/28/2020 0911 Last data filed at 05/28/2020 0800 Gross per 24 hour  Intake 984.5 ml  Output 2600 ml  Net -1615.5 ml  CVP:  [10 mmHg-37 mmHg] 31 mmHg Filed Weights   05/25/20 0400 05/27/20 0700 05/28/20 0500  Weight: 111.3 kg 103.6 kg 104 kg   Physical Exam: General: Obese, chronically ill-appearing, no acute distress HENT: [place], AT, OP clear, MMM, heated high flow, bandage over nasal bridge Eyes: EOMI, no scleral icterus Respiratory: Diminished breath sounds bilaterally.  No crackles, wheezing or rales Cardiovascular: Irregularly irregular, -M/R/G, no JVD GI: BS+, soft, nontender, perc drain in place Extremities:2-3+ pitting edema in lower extremities up to thighs and abdomen,-tenderness Neuro: AAO x4, CNII-XII grossly intact Skin: Intact, no rashes or bruising Psych: Normal mood, normal affect  Resolved Hospital Problem list   Brief respiratory arrest 1/9  Assessment & Plan:  Acute respiratory failure with  hypoxemia and hypercapnia secondary to cor pulmonale and OSA/OHS --Started on lasix gtt on 1/11. Good UOP however CVP remains elevated >25 --Continue heated high flow nasal cannula and BiPAP as needed --Heart failure team consulted. Diuretics (diamox, spironolactone and lasix) per team  Acute on chronic kidney failure stage IIIa --Monitor UOP/Cr --Foley --Strict I&O --Renal dose meds  Acute toxic metabolic encephalopathy secondary to hypercarbia, medications --Reduced zyprexa dosing 1/13 --Decrease gabapentin 1/15 --Minimize sedating meds --BiPAP nightly  Chronic atrial fibrillation --Home medications include Coumadin --Telemetry  Type 2 diabetes --SSI  Recent acute cholecystitis status post percutaneous drain placed on 11/3 --Cont trend output   Best practice (evaluated daily)  Diet: Heart healthy Pain/Anxiety/Delirium protocol (if indicated): As needed  VAP protocol (if indicated): N/A DVT prophylaxis: Coumadin  GI prophylaxis: PPI Glucose control: SSI, changed to AC at bedtime 1/10 Mobility: Bedrest get out of bed 1/10 Disposition: we will continue monitoring in the intensive care if we can get him to less than 50% heated high flow I think we can move him out Goals of Care:  Last date of multidisciplinary goals of care discussion: This was completed on 1/13, this included nurse practitioner and bedside nursing staff in addition to family listed below Family and staff present: [name], his daughter and Mathayus Junior his son in addition to the patient himself who was able to participate Summary of discussion: The patient, and his family continue to desire full scope of medical care this would include CPR should he suffer a cardiac arrest.  They do however concede that he would not want prolonged life support, tracheostomy, or hemodialysis. Follow up goals of care discussion due: 1/20 Code Status: Full   Critical care time:31 minutes    The patient is critically  ill with multiple organ systems failure and requires high complexity decision making for assessment and support, frequent evaluation and titration of therapies, application of advanced monitoring technologies and extensive interpretation of multiple databases.   Mechele Collin, M.D. [facility] Pulmonary/Critical Care Medicine 05/28/2020 9:11 AM   Please see Amion for pager number to reach on-call Pulmonary and Critical Care Team.

## 2020-05-28 NOTE — Progress Notes (Signed)
ANTICOAGULATION CONSULT NOTE  Pharmacy Consult for warfarin Indication: atrial fibrillation  No Known Allergies  Patient Measurements: Height: 5\' 9"  (175.3 cm) Weight: 104 kg (229 lb 4.5 oz) IBW/kg (Calculated) : 70.7  Vital Signs: Temp: 99.2 F (37.3 C) (01/15 0800) Temp Source: Axillary (01/15 0800) BP: 134/84 (01/15 1100) Pulse Rate: 80 (01/15 1100)  Labs: Recent Labs    05/26/20 0245 05/26/20 0506 05/27/20 0718 05/27/20 1828 05/27/20 2154 05/27/20 2159 05/28/20 0610  HGB 10.7*  --  9.6* 11.6* 11.9*  --  9.2*  HCT 36.7*  --  31.9* 34.0* 35.0*  --  30.6*  PLT 160  --  158  --   --   --  167  LABPROT 26.8*  --  30.4*  --   --   --  30.2*  INR 2.6*  --  3.0*  --   --   --  3.0*  CREATININE 1.45*   < > 1.45*  --   --  1.45* 1.49*   < > = values in this interval not displayed.    Estimated Creatinine Clearance: 47.8 mL/min (A) (by C-G formula based on SCr of 1.49 mg/dL (H)).   Assessment: 80 year old male admitted for HF exacerbation. History of afib on warfarin, home dose is 6 mg daily.   INR remains at top end of therapeutic range (3). Hg 9.6, plt 158. No s/sx of bleeding or infusion issues.   Goal of Therapy:  INR 2-3 Monitor platelets by anticoagulation protocol: Yes   Plan:  Warfarin 3mg  PO today Daily PT/INR   Rual Vermeer D. , PharmD, BCPS, BCCCP 05/28/2020, 12:40 PM

## 2020-05-28 NOTE — Evaluation (Signed)
Occupational Therapy Evaluation Patient Details Name: Isaiah [name] MRN: [id number] DOB: [birth year] Today's Date: 05/28/2020    History of Present Illness 80 year old male presented with progressive shortness of breath and hypoxia with bilateral lower extremity swelling on admission.  Admitted per teaching service for acute CHF exacerbation and volume overload.  Morning of 05/22/2020 patient suffered brief respiratory arrest for which PCCM was consulted for further management.  PMHx:  d CHF, DM2. (ropr CVA. afob. PSA. JTM. CAD. CLD stage 3.   Clinical Impression   Pt PTA: pt lvinig with family and requiring assist for ADL and mobility. Pt currently limited be decreased strength, decreased ability to care for self and decreased endurance. Pt maxA to achieve standing and taking steps to [facility] with minA for stability. Pt requiring totalA to scoot to [facility] once lying flat. O2 plummets to 80% when lying flat for ~1 minute. Pt >90% with 2 minutes rest. Pt would benefit from continued OT skilled services. OT following acutely.     Follow Up Recommendations  SNF;Home health OT;Supervision/Assistance - 24 hour (SNF vs HHOT based on family's ability to physically assist pt)    Equipment Recommendations  3 in 1 bedside commode;Wheelchair (measurements OT);Wheelchair cushion (measurements OT);Hospital bed    Recommendations for Other Services       Precautions / Restrictions Precautions Precautions: Fall;Other (comment) Precaution Comments: watch O2 Restrictions Weight Bearing Restrictions: No      Mobility Bed Mobility Overal bed mobility: Needs Assistance Bed Mobility: Supine to Sit;Sit to Supine     Supine to sit: Mod assist Sit to supine: Mod assist   General bed mobility comments: Assist for trunk support and BLE movements    Transfers Overall transfer level: Needs assistance Equipment used: Rolling walker (2 wheeled) Transfers: Sit to/from Stand;Lateral/Scoot Transfers Sit to  Stand: Mod assist;Max assist        Lateral/Scoot Transfers: Min assist General transfer comment: Pt modA to maxA for sit to stand with bed elevated; pt taking steps to [facility] with RW    Balance Overall balance assessment: Needs assistance Sitting-balance support: Feet supported;No upper extremity supported;Single extremity supported Sitting balance-Leahy Scale: Fair     Standing balance support: Bilateral upper extremity supported Standing balance-Leahy Scale: Poor Standing balance comment: reliant on the RW and/or external support                           ADL either performed or assessed with clinical judgement   ADL Overall ADL's : Needs assistance/impaired Eating/Feeding: Set up;Sitting   Grooming: Set up;Sitting   Upper Body Bathing: Minimal assistance;Sitting   Lower Body Bathing: Maximal assistance;+2 for physical assistance;With caregiver independent assisting;Sitting/lateral leans;Sit to/from stand   Upper Body Dressing : Minimal assistance;Sitting   Lower Body Dressing: Maximal assistance;+2 for physical assistance;+2 for safety/equipment;Sitting/lateral leans;Sit to/from stand   Toilet Transfer: Total assistance;+2 for physical assistance;+2 for safety/equipment Toilet Transfer Details (indicate cue type and reason): unable to simulate in standing transfer with elevated bed Toileting- Clothing Manipulation and Hygiene: Maximal assistance;+2 for physical assistance;+2 for safety/equipment       Functional mobility during ADLs: Rolling walker;Cueing for safety;Cueing for sequencing;Maximal assistance;+2 for physical assistance;+2 for safety/equipment General ADL Comments: pt limited be decreased strength, decreased ability to care for self and decreased endurance. Pt maxA to achieve standing and taking steps to [facility] with minA for stability. Pt requiring totalA to scoot to [facility] once lying flat. O2 plummets to 80% when lying flat for ~  1 minute. Pt >90% with 2  minutes rest.     Vision Baseline Vision/History: No visual deficits Patient Visual Report: No change from baseline Vision Assessment?: No apparent visual deficits     Perception     Praxis      Pertinent Vitals/Pain Pain Assessment: No/denies pain Faces Pain Scale: No hurt Pain Intervention(s): Monitored during session;Repositioned     Hand Dominance Right   Extremity/Trunk Assessment Upper Extremity Assessment Upper Extremity Assessment: Generalized weakness   Lower Extremity Assessment Lower Extremity Assessment: Generalized weakness   Cervical / Trunk Assessment Cervical / Trunk Assessment: Kyphotic   Communication Communication Communication: HOH (has hearing aids)   Cognition Arousal/Alertness: Awake/alert Behavior During Therapy: WFL for tasks assessed/performed Overall Cognitive Status: No family/caregiver present to determine baseline cognitive functioning                                 General Comments: Pt following all commands   General Comments  O2 desats with positional change to supine, but with increased O2 and time, pt >90% within 1 minute    Exercises     Shoulder Instructions      Home Living Family/patient expects to be discharged to:: Private residence Living Arrangements: Children Available Help at Discharge: Family;Available 24 hours/day Type of Home: House Home Access: Ramped entrance     Home Layout: Able to live on main level with bedroom/bathroom;Two level;Other (Comment) Alternate Level Stairs-Number of Steps: flight   Bathroom Shower/Tub: Producer, television/film/video: Handicapped height Bathroom Accessibility: Yes   Home Equipment: Environmental consultant - 2 wheels;Cane - single point;Shower seat   Additional Comments: daughter and granddaughter are RNs, pt lives with dtr who is a Char[employer] fundraiser      Prior Functioning/Environment Level of Independence: Needs assistance  Gait / Transfers Assistance Needed: lately has been at a  transfer level mostly, bed to lift chair to [facility].  In recent past (NOV), pt able to ambulate with RW and climb flight of stairs. ADL's / Homemaking Assistance Needed: Takes sponge baths generally, shower at least 1x/wk. Communication / Swallowing Assistance Needed: HOH          OT Problem List: Decreased strength;Decreased activity tolerance;Impaired balance (sitting and/or standing);Decreased coordination;Decreased safety awareness;Decreased cognition;Pain;Impaired UE functional use;Increased edema;Cardiopulmonary status limiting activity      OT Treatment/Interventions: Self-care/ADL training;Therapeutic exercise;Energy conservation;DME and/or AE instruction;Therapeutic activities;Patient/family education;Balance training    OT Goals(Current goals can be found in the care plan section) Acute Rehab OT Goals Patient Stated Goal: Like to take pt home. OT Goal Formulation: With patient Time For Goal Achievement: 06/11/20 Potential to Achieve Goals: Good ADL Goals Pt Will Perform Grooming: with set-up;sitting Pt Will Transfer to Toilet: with min guard assist;bedside commode;stand pivot transfer Pt/caregiver will Perform Home Exercise Program: Increased strength;Both right and left upper extremity;With minimal assist Additional ADL Goal #1: Pt will increase to supervisionA for bed mobility in preparation for OOB ADL. Additional ADL Goal #2: Pt will complete x10 mins of ADL tasks with o2 >90% and 2 seated rest breaks.  OT Frequency: Min 2X/week   Barriers to D/C:            Co-evaluation              AM-PAC OT "6 Clicks" Daily Activity     Outcome Measure Help from another person eating meals?: None Help from another person taking care of personal grooming?: A Little Help  from another person toileting, which includes using toliet, bedpan, or urinal?: A Little Help from another person bathing (including washing, rinsing, drying)?: A Lot Help from another person to put on and  taking off regular upper body clothing?: A Little Help from another person to put on and taking off regular lower body clothing?: A Lot 6 Click Score: 17   End of Session Equipment Utilized During Treatment: Gait belt;Rolling walker;Oxygen Nurse Communication: Mobility status  Activity Tolerance: Patient tolerated treatment well;Patient limited by fatigue Patient left: in bed;with call bell/phone within reach;with nursing/sitter in room  OT Visit Diagnosis: Unsteadiness on feet (R26.81);Muscle weakness (generalized) (M62.81)                Time: 1610-9604 OT Time Calculation (min): 20 min Charges:  OT General Charges $OT Visit: 1 Visit OT Evaluation $OT Eval Moderate Complexity: 1 Mod  Flora Lipps, OTR/L Acute Rehabilitation Services Pager: [phone] Office: [phone]    Cena Bruhn C 05/28/2020, 4:34 PM

## 2020-05-28 NOTE — Progress Notes (Signed)
Advanced Heart Failure Rounding Note  PCP-Cardiologist: Nanetta Batty, MD    Patient Profile:    80 y/o male w/ CAD, Afib, Stage III CKD and severe chronic hypoxic/hypercarbic respiratory failure due to OSA/OHS with baseline pCO2 ~ 80. Now with severe RV failure and massive volume overload.   Subjective:     Had to go back on BIPAP last night due to increased lethargy. ABG 7.34/92/99/97% -> 7.38/85/104/97%  More alert today. Interactive. Remains on lasix gtt. Weight unchanged overnight. Down 22 pounds since admit. CVP still 25  Creatinine stable   Objective:   Weight Range: 104 kg Body mass index is 33.86 kg/m.   Vital Signs:   Temp:  [97.9 F (36.6 C)-99.2 F (37.3 C)] 99.2 F (37.3 C) (01/15 0800) Pulse Rate:  [56-140] 83 (01/15 0900) Resp:  [14-35] 20 (01/15 0900) BP: (103-153)/(52-110) 113/59 (01/15 0900) SpO2:  [92 %-100 %] 94 % (01/15 0911) FiO2 (%):  [30 %-80 %] 70 % (01/15 0911) Weight:  [104 kg] 104 kg (01/15 0500) Last BM Date: 05/27/20  Weight change: Filed Weights   05/25/20 0400 05/27/20 0700 05/28/20 0500  Weight: 111.3 kg 103.6 kg 104 kg    Intake/Output:   Intake/Output Summary (Last 24 hours) at 05/28/2020 1008 Last data filed at 05/28/2020 0900 Gross per 24 hour  Intake 994.5 ml  Output 2600 ml  Net -1605.5 ml      Physical Exam    General:  Sitting up. More alert  HEENT: normal Neck: supple. JVP to ear. Carotids 2+ bilat; no bruits. No lymphadenopathy or thryomegaly appreciated. Cor: PMI nondisplaced. Irregular rate & rhythm. 2/6 TR Lungs: decreased throguhout Abdomen: obese soft, nontender, + distended. No hepatosplenomegaly. No bruits or masses. Good bowel sounds. + C-tube drain Extremities: no cyanosis, clubbing, rash, 2-3+ edema Neuro: alert following commands  Telemetry   Afib 80-90s. Personally reviewed  Labs    CBC Recent Labs    05/27/20 0718 05/27/20 1828 05/27/20 2154 05/28/20 0610  WBC 5.5  --   --  6.0   HGB 9.6*   < > 11.9* 9.2*  HCT 31.9*   < > 35.0* 30.6*  MCV 103.2*  --   --  101.7*  PLT 158  --   --  167   < > = values in this interval not displayed.   Basic Metabolic Panel Recent Labs    16/10/96 0718 05/27/20 1828 05/27/20 2159 05/28/20 0610  NA 137   < > 134* 136  K 4.2   < > 3.9 3.8  CL 83*  --  81* 83*  CO2 44*  --  40* 42*  GLUCOSE 119*  --  173* 128*  BUN 32*  --  33* 31*  CREATININE 1.45*  --  1.45* 1.49*  CALCIUM 9.0  --  9.1 9.1  MG 2.0  --   --   --    < > = values in this interval not displayed.   Liver Function Tests Recent Labs    05/26/20 0506  AST 19  ALT 12  ALKPHOS 67  BILITOT 0.4  PROT 7.2  ALBUMIN 3.1*   No results for input(s): LIPASE, AMYLASE in the last 72 hours. Cardiac Enzymes No results for input(s): CKTOTAL, CKMB, CKMBINDEX, TROPONINI in the last 72 hours.  BNP: BNP (last 3 results) Recent Labs    03/14/20 0752 03/22/20 0117 05/20/20 1133  BNP 532.7* 441.2* 618.9*    ProBNP (last 3 results) No results for input(s):  PROBNP in the last 8760 hours.   D-Dimer No results for input(s): DDIMER in the last 72 hours. Hemoglobin A1C No results for input(s): HGBA1C in the last 72 hours. Fasting Lipid Panel No results for input(s): CHOL, HDL, LDLCALC, TRIG, CHOLHDL, LDLDIRECT in the last 72 hours. Thyroid Function Tests No results for input(s): TSH, T4TOTAL, T3FREE, THYROIDAB in the last 72 hours.  Invalid input(s): FREET3  Other results:   Imaging    No results found.   Medications:     Scheduled Medications: . acetaZOLAMIDE  500 mg Oral BID  . allopurinol  100 mg Oral BID  . chlorhexidine  15 mL Mouth Rinse BID  . Chlorhexidine Gluconate Cloth  6 each Topical Daily  . ferrous sulfate  325 mg Oral Q breakfast  . gabapentin  100 mg Oral TID  . hydrALAZINE  100 mg Oral BID  . insulin aspart  0-15 Units Subcutaneous TID WC  . isosorbide mononitrate  60 mg Oral Daily  . mouth rinse  15 mL Mouth Rinse q12n4p  .  OLANZapine zydis  5 mg Oral QHS  . pantoprazole  40 mg Oral BID AC  . polyethylene glycol  17 g Oral BID  . potassium chloride SA  20 mEq Oral Daily  . pravastatin  40 mg Oral QHS  . senna  1 tablet Oral Daily  . sodium chloride flush  10-40 mL Intracatheter Q12H  . sodium chloride flush  3 mL Intravenous Q12H  . spironolactone  50 mg Oral Daily  . Warfarin - Pharmacist Dosing Inpatient   Does not apply q1600    Infusions: . sodium chloride    . furosemide (LASIX) 200 mg in dextrose 5% 100 mL (2mg /mL) infusion 20 mg/hr (05/28/20 0900)    PRN Medications: sodium chloride, acetaminophen, bisacodyl, ondansetron (ZOFRAN) IV, sodium chloride flush, sodium chloride flush   Assessment/Plan   1. Acute on chronic heart failure with mid-range EF and severe RV failure:  Echo this admission with EF 45-50%, anteroseptal HK, D-shaped septum with PASP 63 mmHg and dilated IVC.  During 11/21 admission, initial echo showed EF 20% with severe RV dysfunction.  Repeat echo later in hospitalization with EF up to 55% and mild RV dysfunction.  Thought to have stress/Takotsubo cardiomyopathy in setting of septic shock/cholecystitis. Co-ox ok Cr remains stable at 1.5.  - improving with diuresis but remains volume overloaded - continue lasix gtt at 20 hr +  acetazolamide 500 bid + spironolactone 50 mg daily.   - Add metoalzone - no UNNA boots w/ severe PAD. Continue w/ TED hoses  2. Acute on Chronic Hypoxic Respiratory Failure due to end-stage OSA/OHS baseline pCO2 85-95 range - Continue BiPAP support.  - This is truly end-stage. Need to consider tracheostomy but he refuses. - CCM managing 3. Atrial fibrillation: Chronic, rate is controlled. H/o CVA.  - Continue warfarin, INR therapeutic at 3.0 4. CAD: Anterior MI 1994, CABG 1997 with LIMA-LAD and seq SVG-OM/D.  Had PCI to proximal RCA in 2018.  No chest pain. EF has generally been in the 50% range (45-50% on echo this admission). HS-TnI minimally elevated  with no trend this admission, ACS is unlikely.  - Continue home statin.  - He is on warfarin so no ASA.  - No s/s angina 5. PAD: H/o L=>R fem-fem crossover grafting, had to be removed due to infection and had patch angioplasty.  6. AKI on CKD stage 3: Initial creatinine 1.9, now down to 1.5.  Follow with diuresis. Stable7.  H/o acute cholecystitis: Has cholecystostomy tube from 11/21.  7. GOC: prognosis is poor. CCM & palliative care family meeting, continue full support with goal to get him discharged to home.   CRITICAL CARE Performed by: Arvilla Meres  Total critical care time: 35 minutes  Critical care time was exclusive of separately billable procedures and treating other patients.  Critical care was necessary to treat or prevent imminent or life-threatening deterioration.  Critical care was time spent personally by me (independent of midlevel providers or residents) on the following activities: development of treatment plan with patient and/or surrogate as well as nursing, discussions with consultants, evaluation of patient's response to treatment, examination of patient, obtaining history from patient or surrogate, ordering and performing treatments and interventions, ordering and review of laboratory studies, ordering and review of radiographic studies, pulse oximetry and re-evaluation of patient's condition.   Length of Stay: 8  Arvilla Meres, MD  05/28/2020, 10:08 AM  Advanced Heart Failure Team Pager [phone] (M-F; 7a - 4p)  Please contact [facility] for night-coverage after hours (4p -7a ) and weekends on [email or web address]

## 2020-05-28 NOTE — Plan of Care (Signed)
  Problem: Education: Goal: Ability to demonstrate management of disease process will improve Outcome: Not Progressing   Problem: Activity: Goal: Capacity to carry out activities will improve Outcome: Not Progressing   Problem: Cardiac: Goal: Ability to achieve and maintain adequate cardiopulmonary perfusion will improve Outcome: Progressing   Problem: Clinical Measurements: Goal: Will remain free from infection Outcome: Progressing Goal: Respiratory complications will improve Outcome: Not Progressing

## 2020-05-29 DIAGNOSIS — J9601 Acute respiratory failure with hypoxia: Secondary | ICD-10-CM | POA: Diagnosis not present

## 2020-05-29 DIAGNOSIS — I5033 Acute on chronic diastolic (congestive) heart failure: Secondary | ICD-10-CM | POA: Diagnosis not present

## 2020-05-29 DIAGNOSIS — J9621 Acute and chronic respiratory failure with hypoxia: Secondary | ICD-10-CM | POA: Diagnosis not present

## 2020-05-29 DIAGNOSIS — J9622 Acute and chronic respiratory failure with hypercapnia: Secondary | ICD-10-CM | POA: Diagnosis not present

## 2020-05-29 DIAGNOSIS — I5023 Acute on chronic systolic (congestive) heart failure: Secondary | ICD-10-CM | POA: Diagnosis not present

## 2020-05-29 LAB — CBC
HCT: 32.1 % — ABNORMAL LOW (ref 39.0–52.0)
Hemoglobin: 9.3 g/dL — ABNORMAL LOW (ref 13.0–17.0)
MCH: 29.9 pg (ref 26.0–34.0)
MCHC: 29 g/dL — ABNORMAL LOW (ref 30.0–36.0)
MCV: 103.2 fL — ABNORMAL HIGH (ref 80.0–100.0)
Platelets: 150 10*3/uL (ref 150–400)
RBC: 3.11 MIL/uL — ABNORMAL LOW (ref 4.22–5.81)
RDW: 15.7 % — ABNORMAL HIGH (ref 11.5–15.5)
WBC: 6.8 10*3/uL (ref 4.0–10.5)
nRBC: 0 % (ref 0.0–0.2)

## 2020-05-29 LAB — GLUCOSE, CAPILLARY
Glucose-Capillary: 131 mg/dL — ABNORMAL HIGH (ref 70–99)
Glucose-Capillary: 189 mg/dL — ABNORMAL HIGH (ref 70–99)
Glucose-Capillary: 145 mg/dL — ABNORMAL HIGH (ref 70–99)
Glucose-Capillary: 132 mg/dL — ABNORMAL HIGH (ref 70–99)

## 2020-05-29 LAB — BASIC METABOLIC PANEL
Anion gap: 10 (ref 5–15)
BUN: 34 mg/dL — ABNORMAL HIGH (ref 8–23)
CO2: 44 mmol/L — ABNORMAL HIGH (ref 22–32)
Calcium: 9 mg/dL (ref 8.9–10.3)
Chloride: 81 mmol/L — ABNORMAL LOW (ref 98–111)
Creatinine, Ser: 1.55 mg/dL — ABNORMAL HIGH (ref 0.61–1.24)
GFR, Estimated: 45 mL/min — ABNORMAL LOW (ref >60)
Glucose, Bld: 139 mg/dL — ABNORMAL HIGH (ref 70–99)
Potassium: 3.7 mmol/L (ref 3.5–5.1)
Sodium: 135 mmol/L (ref 135–145)

## 2020-05-29 LAB — PROTIME-INR
INR: 2.8 — ABNORMAL HIGH (ref 0.8–1.2)
Prothrombin Time: 28.8 seconds — ABNORMAL HIGH (ref 11.4–15.2)

## 2020-05-29 MED ORDER — WARFARIN SODIUM 4 MG PO TABS
4.0000 mg | ORAL_TABLET | Freq: Once | ORAL | Status: AC
  Administered 2020-05-29: 4 mg via ORAL
  Filled 2020-05-29: qty 1

## 2020-05-29 MED ORDER — METOLAZONE 5 MG PO TABS
5.0000 mg | ORAL_TABLET | Freq: Every day | ORAL | Status: DC
  Administered 2020-05-29 – 2020-05-31 (×3): 5 mg via ORAL
  Filled 2020-05-29 (×4): qty 1

## 2020-05-29 NOTE — Progress Notes (Signed)
RT note- Patient with sp02 82%, HFNC kinked with minimal flow, increased fio2 and flow for recovery, sp02 now 97%, continue to wean as tolerated.

## 2020-05-29 NOTE — Progress Notes (Signed)
Advanced Heart Failure Rounding Note  PCP-Cardiologist: Nanetta Batty, MD    Patient Profile:    80 y/o male w/ CAD, Afib, Stage III CKD and severe chronic hypoxic/hypercarbic respiratory failure due to OSA/OHS with baseline pCO2 ~ 80. Now with severe RV failure and massive volume overload.   Subjective:    Remains on HFNC. More alert today. CVP 25 -> 19. Remains on lasix gtt. Weight down 2 pounds overnight. (24 pounds total). Creatinine stable at 1.5  Objective:   Weight Range: 103.4 kg Body mass index is 33.66 kg/m.   Vital Signs:   Temp:  [97.3 F (36.3 C)-98.6 F (37 C)] 98.4 F (36.9 C) (01/16 0400) Pulse Rate:  [39-93] 93 (01/16 1000) Resp:  [16-34] 18 (01/16 1000) BP: (80-158)/(48-122) 155/48 (01/16 1000) SpO2:  [93 %-100 %] 98 % (01/16 1000) FiO2 (%):  [50 %-70 %] 60 % (01/16 0924) Weight:  [103.4 kg] 103.4 kg (01/16 0500) Last BM Date: 05/27/20  Weight change: Filed Weights   05/27/20 0700 05/28/20 0500 05/29/20 0500  Weight: 103.6 kg 104 kg 103.4 kg    Intake/Output:   Intake/Output Summary (Last 24 hours) at 05/29/2020 1050 Last data filed at 05/29/2020 1000 Gross per 24 hour  Intake 1697.5 ml  Output 3750 ml  Net -2052.5 ml      Physical Exam    General:  Sitting up in bed on HFNC. More alert  HEENT: normal +HFNC Neck: supple. JVP to ear Carotids 2+ bilat; no bruits. No lymphadenopathy or thryomegaly appreciated. Cor: PMI nondisplaced. Irregular rate & rhythm. No rubs, gallops or murmurs. Lungs: decreased throughout Abdomen: obese soft, nontender, + distended. + RUQ drain No hepatosplenomegaly. No bruits or masses. Good bowel sounds. Extremities: no cyanosis, clubbing, rash, 2-3+ edema Neuro: alert & orientedx3, cranial nerves grossly intact. moves all 4 extremities w/o difficulty. Affect pleasant   Telemetry   Afib 90s. Personally reviewed  Labs    CBC Recent Labs    05/28/20 0610 05/29/20 0504  WBC 6.0 6.8  HGB 9.2* 9.3*   HCT 30.6* 32.1*  MCV 101.7* 103.2*  PLT 167 150   Basic Metabolic Panel Recent Labs    16/10/96 0718 05/27/20 1828 05/28/20 0610 05/29/20 0504  NA 137   < > 136 135  K 4.2   < > 3.8 3.7  CL 83*   < > 83* 81*  CO2 44*   < > 42* 44*  GLUCOSE 119*   < > 128* 139*  BUN 32*   < > 31* 34*  CREATININE 1.45*   < > 1.49* 1.55*  CALCIUM 9.0   < > 9.1 9.0  MG 2.0  --   --   --    < > = values in this interval not displayed.   Liver Function Tests No results for input(s): AST, ALT, ALKPHOS, BILITOT, PROT, ALBUMIN in the last 72 hours. No results for input(s): LIPASE, AMYLASE in the last 72 hours. Cardiac Enzymes No results for input(s): CKTOTAL, CKMB, CKMBINDEX, TROPONINI in the last 72 hours.  BNP: BNP (last 3 results) Recent Labs    03/14/20 0752 03/22/20 0117 05/20/20 1133  BNP 532.7* 441.2* 618.9*    ProBNP (last 3 results) No results for input(s): PROBNP in the last 8760 hours.   D-Dimer No results for input(s): DDIMER in the last 72 hours. Hemoglobin A1C No results for input(s): HGBA1C in the last 72 hours. Fasting Lipid Panel No results for input(s): CHOL, HDL, LDLCALC, TRIG, CHOLHDL,  LDLDIRECT in the last 72 hours. Thyroid Function Tests No results for input(s): TSH, T4TOTAL, T3FREE, THYROIDAB in the last 72 hours.  Invalid input(s): FREET3  Other results:   Imaging    No results found.   Medications:     Scheduled Medications: . acetaZOLAMIDE  500 mg Oral BID  . allopurinol  100 mg Oral BID  . chlorhexidine  15 mL Mouth Rinse BID  . Chlorhexidine Gluconate Cloth  6 each Topical Daily  . ferrous sulfate  325 mg Oral Q breakfast  . gabapentin  100 mg Oral TID  . hydrALAZINE  100 mg Oral BID  . insulin aspart  0-15 Units Subcutaneous TID WC  . isosorbide mononitrate  60 mg Oral Daily  . mouth rinse  15 mL Mouth Rinse q12n4p  . OLANZapine zydis  5 mg Oral QHS  . pantoprazole  40 mg Oral BID AC  . polyethylene glycol  17 g Oral BID  . potassium  chloride SA  20 mEq Oral Daily  . pravastatin  40 mg Oral QHS  . senna  1 tablet Oral Daily  . sodium chloride flush  10-40 mL Intracatheter Q12H  . sodium chloride flush  3 mL Intravenous Q12H  . spironolactone  50 mg Oral Daily  . warfarin  4 mg Oral ONCE-1600  . Warfarin - Pharmacist Dosing Inpatient   Does not apply q1600    Infusions: . sodium chloride    . furosemide (LASIX) 200 mg in dextrose 5% 100 mL (2mg /mL) infusion 20 mg/hr (05/29/20 0941)    PRN Medications: sodium chloride, acetaminophen, bisacodyl, ondansetron (ZOFRAN) IV, sodium chloride flush, sodium chloride flush   Assessment/Plan   1. Acute on chronic heart failure with mid-range EF and severe RV failure:  Echo this admission with EF 45-50%, anteroseptal HK, D-shaped septum with PASP 63 mmHg and dilated IVC.  During 11/21 admission, initial echo showed EF 20% with severe RV dysfunction.  Repeat echo later in hospitalization with EF up to 55% and mild RV dysfunction.  Thought to have stress/Takotsubo cardiomyopathy in setting of septic shock/cholecystitis. Co-ox has been ok. Continues to diurese well - improving with diuresis but remains volume overloaded. CVP 19. Renal function stable. - continue lasix gtt at 20 hr +  acetazolamide 500 bid + spironolactone 50 mg daily.   - Received metolazone yesterday. Will give one dose today. - no UNNA boots w/ severe PAD. Continue w/ TED hoses  2. Acute on Chronic Hypoxic Respiratory Failure due to end-stage OSA/OHS baseline pCO2 85-95 range - Continue BiPAP support.  - This is truly end-stage. Need to consider tracheostomy but he refuses. - CCM managing 3. Atrial fibrillation: Chronic, rate is controlled. H/o CVA.  - Continue warfarin, INR therapeutic at 2.8 4. CAD: Anterior MI 1994, CABG 1997 with LIMA-LAD and seq SVG-OM/D.  Had PCI to proximal RCA in 2018.  No chest pain. EF has generally been in the 50% range (45-50% on echo this admission). HS-TnI minimally elevated with  no trend this admission, ACS is unlikely.  - Continue home statin.  - He is on warfarin so no ASA.  - No s/s angina 5. PAD: H/o L=>R fem-fem crossover grafting, had to be removed due to infection and had patch angioplasty.  6. AKI on CKD stage 3: Initial creatinine 1.9, now down to 1.5.  Follow with diuresis. Stable7. H/o acute cholecystitis: Has cholecystostomy tube from 11/21.  7. GOC: prognosis is poor. CCM & palliative care family meeting, continue full support with  goal to get him discharged to home.   CRITICAL CARE Performed by: Arvilla Meres  Total critical care time: 35 minutes  Critical care time was exclusive of separately billable procedures and treating other patients.  Critical care was necessary to treat or prevent imminent or life-threatening deterioration.  Critical care was time spent personally by me (independent of midlevel providers or residents) on the following activities: development of treatment plan with patient and/or surrogate as well as nursing, discussions with consultants, evaluation of patient's response to treatment, examination of patient, obtaining history from patient or surrogate, ordering and performing treatments and interventions, ordering and review of laboratory studies, ordering and review of radiographic studies, pulse oximetry and re-evaluation of patient's condition.   Length of Stay: 9  Arvilla Meres, MD  05/29/2020, 10:50 AM  Advanced Heart Failure Team Pager [phone] (M-F; 7a - 4p)  Please contact [facility] for night-coverage after hours (4p -7a ) and weekends on [email or web address]

## 2020-05-29 NOTE — Progress Notes (Signed)
NAME:  Isaiah [name], MRN:  [id number], DOB:  [birth year], LOS: 9 ADMISSION DATE:  05/20/2020, CONSULTATION DATE:  05/22/2020 REFERRING MD:  Dr. Antony Contras, CHIEF COMPLAINT:  Respiratory arrest    Brief History:  80 year old male presented with progressive shortness of breath and hypoxia with bilateral lower extremity swelling on admission.  Admitted per teaching service for acute CHF exasperation and volume overload.  Morning of 05/22/2020 patient suffered brief respiratory arrest for which PCCM was consulted for further management  Past Medical History:  Diastolic congestive heart failure, type 2 diabetes, prior CVA, atrial fibrillation on chronic anticoagulation, obstructive sleep apnea, hyperlipidemia, hypertension, CAD, and CKD stage III   Significant Hospital Events:  Admitted 1/7 1/9 critical care consulted for near respiratory arrest placed on BiPAP responded to noninvasive support and diuretics  1/10: Improving oxygen requirements improving 1/11: Has had 2 more isolated episodes of hypoxia with saturations as low as the 40s typically happening after positional change responding well to positive pressure via noninvasive mask Lasix dosing tripled added spironolactone, heart failure team evaluated and started Lasix drip 1/11: Making progress from a fluid volume standpoint but fatigued, still requiring BiPAP.  Overall clinically actually looks worse.  Started on Lasix drip after advanced heart failure team evaluation 1/12: Required BiPAP most of the day 1/13: Now down to 10 L from fluid volume standpoint after adjusting diuretics PICC line placed.  Goals of care discussion had with the patient, his daughter and son.  They still desire full aggressive scope of care including intubation, ventilation, and CPR should the situation arise.  They do however acknowledge that the patient would not want prolonged mechanical ventilation, also would not be interested in dialysis.  1/16: Net negative 18L since  admission with aggressive diuresis. Remains volume overloaded with no change in FIO2 requirement of 50-60%  Consults:    Procedures:  Echocardiogram 1/9: Anterior septal hypokinesis with LV EF now 45 to 50% with decreased LV function.  Unable to determine diastolic function.  But there was interventricular septal flattening in systole and diastole consistent with right ventricular volume overload.  There is severely elevated pulmonary artery systolic pressure.  Significant Diagnostic Tests:  CXR 1/7 >Worsening CHF with bilateral effusions left greater than right.  Micro Data:  COVID 1/7 > Negative  RVP 1/7 > negative  Antimicrobials:     Interim History / Subjective:  Awake and alert on heated high flow. Mildly confused, not oriented to location.  Objective   Blood pressure 121/64, pulse 79, temperature 98.4 F (36.9 C), temperature source Oral, resp. rate (!) 30, height 5\' 9"  (1.753 m), weight 103.4 kg, SpO2 93 %. CVP:  [17 mmHg-34 mmHg] 27 mmHg  FiO2 (%):  [50 %-70 %] 60 %   Intake/Output Summary (Last 24 hours) at 05/29/2020 1000 Last data filed at 05/29/2020 0700 Gross per 24 hour  Intake 1567.5 ml  Output 3750 ml  Net -2182.5 ml  CVP:  [17 mmHg-34 mmHg] 27 mmHg Filed Weights   05/27/20 0700 05/28/20 0500 05/29/20 0500  Weight: 103.6 kg 104 kg 103.4 kg   Physical Exam: General: Obese, chronically ill-appearing, no acute distress HENT: [place], AT, OP clear, MMM, heated high flow nasal cannula Eyes: EOMI, no scleral icterus Respiratory: Bibasilar crackles Cardiovascular: RRR, -M/R/G, no JVD GI: BS+, soft, nontender, percutaneous drain with dark nonbloody output Extremities: 2+ pitting edema in lower extremities,-tenderness Psych: Normal mood, normal affect GU: Foley in place  Resolved Hospital Problem list   Brief respiratory arrest 1/9  Assessment & Plan:  Acute respiratory failure with hypoxemia and hypercapnia secondary to cor pulmonale and OSA/OHS Net negative  18L since admission with aggressive diuresis. Remains volume overloaded with no change in FIO2 requirement of 50-60% --Started on lasix gtt on 1/11. Good UOP however CVP remains elevated >25 --Continue heated high flow nasal cannula and BiPAP as needed --Heart failure team consulted. Diuretics (diamox, spironolactone and lasix) per team --Daily potassium  Acute on chronic kidney failure stage IIIa, stable Cr 1.45-1.55 --Monitor UOP/Cr --Foley --Strict I&O --Renal dose meds  Acute toxic metabolic encephalopathy secondary to hypercarbia, medications --Reduced zyprexa dosing 1/13 --Decrease gabapentin 1/15 --Minimize sedating meds --BiPAP nightly  Chronic atrial fibrillation --Home medications include Coumadin --Telemetry  Type 2 diabetes --SSI  Recent acute cholecystitis status post percutaneous drain placed on 11/3 --Cont trend output   Best practice (evaluated daily)  Diet: Heart healthy Pain/Anxiety/Delirium protocol (if indicated): As needed  VAP protocol (if indicated): N/A DVT prophylaxis: Coumadin  GI prophylaxis: PPI Glucose control: SSI, changed to AC at bedtime 1/10 Mobility: Bedrest get out of bed 1/10 Disposition: we will continue monitoring in the intensive care if we can get him to less than 50% heated high flow I think we can move him out  Goals of Care:  Last date of multidisciplinary goals of care discussion: This was completed on 1/13, this included nurse practitioner and bedside nursing staff in addition to family listed below Family and staff present: [name], his daughter and Brittney Junior his son in addition to the patient himself who was able to participate Summary of discussion: The patient, and his family continue to desire full scope of medical care this would include CPR should he suffer a cardiac arrest.  They do however concede that he would not want prolonged life support, tracheostomy, or hemodialysis. Follow up goals of care discussion  due: 1/20 Code Status: Full   Critical care time: 40 minutes    The patient is critically ill with multiple organ systems failure and requires high complexity decision making for assessment and support, frequent evaluation and titration of therapies, application of advanced monitoring technologies and extensive interpretation of multiple databases.    Mechele Collin, M.D. [facility] Pulmonary/Critical Care Medicine 05/29/2020 10:00 AM   Please see Amion for pager number to reach on-call Pulmonary and Critical Care Team.

## 2020-05-29 NOTE — Progress Notes (Signed)
RT NOTE:  Pt switched to BIPAP for over night rest.

## 2020-05-29 NOTE — Progress Notes (Signed)
ANTICOAGULATION CONSULT NOTE  Pharmacy Consult for warfarin Indication: atrial fibrillation  No Known Allergies  Patient Measurements: Height: 5\' 9"  (175.3 cm) Weight: 103.4 kg (227 lb 15.3 oz) IBW/kg (Calculated) : 70.7  Vital Signs: Temp: 98.4 F (36.9 C) (01/16 0400) Temp Source: Oral (01/16 0400) BP: 121/64 (01/16 0700) Pulse Rate: 79 (01/16 0700)  Labs: Recent Labs    05/27/20 0718 05/27/20 1828 05/27/20 2154 05/27/20 2159 05/28/20 0610 05/29/20 0504  HGB 9.6*   < > 11.9*  --  9.2* 9.3*  HCT 31.9*   < > 35.0*  --  30.6* 32.1*  PLT 158  --   --   --  167 150  LABPROT 30.4*  --   --   --  30.2* 28.8*  INR 3.0*  --   --   --  3.0* 2.8*  CREATININE 1.45*  --   --  1.45* 1.49* 1.55*   < > = values in this interval not displayed.    Estimated Creatinine Clearance: 45.8 mL/min (A) (by C-G formula based on SCr of 1.55 mg/dL (H)).   Assessment: 80 year old male admitted for HF exacerbation. History of afib on warfarin, home dose is 6 mg daily.   INR therapeutic and has trended down with gradual dose reduction.  CBC stable; no bleeding reported.  Goal of Therapy:  INR 2-3 Monitor platelets by anticoagulation protocol: Yes   Plan:  Warfarin 4mg  PO today Daily PT/INR   Elva Mauro D. , PharmD, BCPS, BCCCP 05/29/2020, 10:26 AM

## 2020-05-30 DIAGNOSIS — J9601 Acute respiratory failure with hypoxia: Secondary | ICD-10-CM | POA: Diagnosis not present

## 2020-05-30 DIAGNOSIS — J9622 Acute and chronic respiratory failure with hypercapnia: Secondary | ICD-10-CM | POA: Diagnosis not present

## 2020-05-30 DIAGNOSIS — I50810 Right heart failure, unspecified: Secondary | ICD-10-CM | POA: Diagnosis not present

## 2020-05-30 DIAGNOSIS — I5033 Acute on chronic diastolic (congestive) heart failure: Secondary | ICD-10-CM | POA: Diagnosis not present

## 2020-05-30 DIAGNOSIS — J9621 Acute and chronic respiratory failure with hypoxia: Secondary | ICD-10-CM | POA: Diagnosis not present

## 2020-05-30 LAB — BASIC METABOLIC PANEL
Anion gap: 13 (ref 5–15)
BUN: 37 mg/dL — ABNORMAL HIGH (ref 8–23)
CO2: 43 mmol/L — ABNORMAL HIGH (ref 22–32)
Calcium: 9.3 mg/dL (ref 8.9–10.3)
Chloride: 78 mmol/L — ABNORMAL LOW (ref 98–111)
Creatinine, Ser: 1.68 mg/dL — ABNORMAL HIGH (ref 0.61–1.24)
GFR, Estimated: 41 mL/min — ABNORMAL LOW (ref >60)
Glucose, Bld: 159 mg/dL — ABNORMAL HIGH (ref 70–99)
Potassium: 3.5 mmol/L (ref 3.5–5.1)
Sodium: 134 mmol/L — ABNORMAL LOW (ref 135–145)

## 2020-05-30 LAB — GLUCOSE, CAPILLARY
Glucose-Capillary: 122 mg/dL — ABNORMAL HIGH (ref 70–99)
Glucose-Capillary: 200 mg/dL — ABNORMAL HIGH (ref 70–99)
Glucose-Capillary: 157 mg/dL — ABNORMAL HIGH (ref 70–99)
Glucose-Capillary: 151 mg/dL — ABNORMAL HIGH (ref 70–99)

## 2020-05-30 LAB — CBC
HCT: 32.2 % — ABNORMAL LOW (ref 39.0–52.0)
Hemoglobin: 9.4 g/dL — ABNORMAL LOW (ref 13.0–17.0)
MCH: 29.8 pg (ref 26.0–34.0)
MCHC: 29.2 g/dL — ABNORMAL LOW (ref 30.0–36.0)
MCV: 102.2 fL — ABNORMAL HIGH (ref 80.0–100.0)
Platelets: 167 10*3/uL (ref 150–400)
RBC: 3.15 MIL/uL — ABNORMAL LOW (ref 4.22–5.81)
RDW: 15.4 % (ref 11.5–15.5)
WBC: 7.6 10*3/uL (ref 4.0–10.5)
nRBC: 0 % (ref 0.0–0.2)

## 2020-05-30 LAB — PROTIME-INR
INR: 2.7 — ABNORMAL HIGH (ref 0.8–1.2)
Prothrombin Time: 27.6 seconds — ABNORMAL HIGH (ref 11.4–15.2)

## 2020-05-30 LAB — MAGNESIUM: Magnesium: 2 mg/dL (ref 1.7–2.4)

## 2020-05-30 MED ORDER — POTASSIUM CHLORIDE CRYS ER 20 MEQ PO TBCR
20.0000 meq | EXTENDED_RELEASE_TABLET | Freq: Once | ORAL | Status: AC
  Administered 2020-05-30: 20 meq via ORAL
  Filled 2020-05-30: qty 1

## 2020-05-30 MED ORDER — WARFARIN SODIUM 5 MG PO TABS
5.0000 mg | ORAL_TABLET | Freq: Once | ORAL | Status: AC
  Administered 2020-05-30: 5 mg via ORAL
  Filled 2020-05-30: qty 1

## 2020-05-30 NOTE — Plan of Care (Signed)
  Problem: Health Behavior/Discharge Planning: Goal: Ability to manage health-related needs will improve Outcome: Progressing   Problem: Activity: Goal: Risk for activity intolerance will decrease Outcome: Progressing   Problem: Nutrition: Goal: Adequate nutrition will be maintained Outcome: Progressing   Problem: Coping: Goal: Level of anxiety will decrease Outcome: Progressing   

## 2020-05-30 NOTE — Progress Notes (Addendum)
Advanced Heart Failure Rounding Note  PCP-Cardiologist: Nanetta Batty, MD    Patient Profile:    80 y/o male w/ CAD, Afib, Stage III CKD and severe chronic hypoxic/hypercarbic respiratory failure due to OSA/OHS with baseline pCO2 ~ 80. Now with severe RV failure and massive volume overload.   Subjective:    Remains on HFNC 30L/min.   Remains on lasix gtt at 20 Hr + Diamox 500 bid + Spiro 50. Got 5 of metolazone yesterday.  -5.7L in UOP yesterday. Weight down 7 pounds overnight. (down 31 pounds total).  CVP 15-16  BMP pending.    Sitting up in bed. Feels better.     Objective:   Weight Range: 100.2 kg Body mass index is 32.62 kg/m.   Vital Signs:   Temp:  [96.8 F (36 C)-98.3 F (36.8 C)] 98.2 F (36.8 C) (01/17 0400) Pulse Rate:  [39-93] 79 (01/17 0600) Resp:  [14-25] 20 (01/17 0600) BP: (116-155)/(48-91) 138/65 (01/17 0600) SpO2:  [91 %-99 %] 92 % (01/17 0600) FiO2 (%):  [30 %-60 %] 30 % (01/17 0328) Weight:  [100.2 kg] 100.2 kg (01/17 0500) Last BM Date: 05/27/20  Weight change: Filed Weights   05/28/20 0500 05/29/20 0500 05/30/20 0500  Weight: 104 kg 103.4 kg 100.2 kg    Intake/Output:   Intake/Output Summary (Last 24 hours) at 05/30/2020 0835 Last data filed at 05/30/2020 0600 Gross per 24 hour  Intake 959.09 ml  Output 5750 ml  Net -4790.91 ml      Physical Exam    CVP 15-16 General:  chronically ill appearing, elderly WM. No respiratory difficulty HEENT: normal Neck: supple. JVD 16 cm. Carotids 2+ bilat; no bruits. No lymphadenopathy or thyromegaly appreciated. Cor: PMI nondisplaced. Irregular  Rhythm and rate. No rubs, gallops or murmurs. Lungs: decreased BS at the bases  Abdomen: soft, nontender, nondistended. No hepatosplenomegaly. No bruits or masses. Good bowel sounds. Extremities: no cyanosis, clubbing, rash, edema +  Bilateral TED hoses, + RUE PICC Neuro: alert & oriented x 3, cranial nerves grossly intact. moves all 4 extremities  w/o difficulty. Affect pleasant. GU: + Foley, Urine clear    Telemetry   Afib w/ PVCs 70s. Personally reviewed  Labs    CBC Recent Labs    05/29/20 0504 05/30/20 0455  WBC 6.8 7.6  HGB 9.3* 9.4*  HCT 32.1* 32.2*  MCV 103.2* 102.2*  PLT 150 167   Basic Metabolic Panel Recent Labs    30/16/01 0610 05/29/20 0504  NA 136 135  K 3.8 3.7  CL 83* 81*  CO2 42* 44*  GLUCOSE 128* 139*  BUN 31* 34*  CREATININE 1.49* 1.55*  CALCIUM 9.1 9.0   Liver Function Tests No results for input(s): AST, ALT, ALKPHOS, BILITOT, PROT, ALBUMIN in the last 72 hours. No results for input(s): LIPASE, AMYLASE in the last 72 hours. Cardiac Enzymes No results for input(s): CKTOTAL, CKMB, CKMBINDEX, TROPONINI in the last 72 hours.  BNP: BNP (last 3 results) Recent Labs    03/14/20 0752 03/22/20 0117 05/20/20 1133  BNP 532.7* 441.2* 618.9*    ProBNP (last 3 results) No results for input(s): PROBNP in the last 8760 hours.   D-Dimer No results for input(s): DDIMER in the last 72 hours. Hemoglobin A1C No results for input(s): HGBA1C in the last 72 hours. Fasting Lipid Panel No results for input(s): CHOL, HDL, LDLCALC, TRIG, CHOLHDL, LDLDIRECT in the last 72 hours. Thyroid Function Tests No results for input(s): TSH, T4TOTAL, T3FREE, THYROIDAB in the  last 72 hours.  Invalid input(s): FREET3  Other results:   Imaging    No results found.   Medications:     Scheduled Medications: . acetaZOLAMIDE  500 mg Oral BID  . allopurinol  100 mg Oral BID  . chlorhexidine  15 mL Mouth Rinse BID  . Chlorhexidine Gluconate Cloth  6 each Topical Daily  . ferrous sulfate  325 mg Oral Q breakfast  . gabapentin  100 mg Oral TID  . hydrALAZINE  100 mg Oral BID  . insulin aspart  0-15 Units Subcutaneous TID WC  . isosorbide mononitrate  60 mg Oral Daily  . mouth rinse  15 mL Mouth Rinse q12n4p  . metolazone  5 mg Oral Daily  . OLANZapine zydis  5 mg Oral QHS  . pantoprazole  40 mg Oral  BID AC  . polyethylene glycol  17 g Oral BID  . potassium chloride SA  20 mEq Oral Daily  . pravastatin  40 mg Oral QHS  . senna  1 tablet Oral Daily  . sodium chloride flush  10-40 mL Intracatheter Q12H  . sodium chloride flush  3 mL Intravenous Q12H  . spironolactone  50 mg Oral Daily  . Warfarin - Pharmacist Dosing Inpatient   Does not apply q1600    Infusions: . sodium chloride    . furosemide (LASIX) 200 mg in dextrose 5% 100 mL (2mg /mL) infusion 20 mg/hr (05/30/20 0600)    PRN Medications: sodium chloride, acetaminophen, bisacodyl, ondansetron (ZOFRAN) IV, sodium chloride flush, sodium chloride flush   Assessment/Plan   1. Acute on chronic heart failure with mid-range EF and severe RV failure:  Echo this admission with EF 45-50%, anteroseptal HK, D-shaped septum with PASP 63 mmHg and dilated IVC.  During 11/21 admission, initial echo showed EF 20% with severe RV dysfunction.  Repeat echo later in hospitalization with EF up to 55% and mild RV dysfunction.  Thought to have stress/Takotsubo cardiomyopathy in setting of septic shock/cholecystitis. Co-ox has been ok. Continues to diurese well - improving with diuresis but remains volume overloaded. CVP 15-16. BMP pending . - continue lasix gtt at 20 hr +  acetazolamide 500 bid + spironolactone 50 mg daily + metolazone 5 mg daily.   - If SCr/K stable, will give another dose of metolazone  - no UNNA boots w/ severe PAD. Continue w/ TED hoses  2. Acute on Chronic Hypoxic Respiratory Failure due to end-stage OSA/OHS baseline pCO2 85-95 range - Continue BiPAP support.  - This is truly end-stage. Need to consider tracheostomy but he refuses. - CCM managing 3. Atrial fibrillation: Chronic, rate is controlled. H/o CVA.  - Continue warfarin, INR therapeutic at 2.7 4. CAD: Anterior MI 1994, CABG 1997 with LIMA-LAD and seq SVG-OM/D.  Had PCI to proximal RCA in 2018.  No chest pain. EF has generally been in the 50% range (45-50% on echo this  admission). HS-TnI minimally elevated with no trend this admission, ACS is unlikely.  - Continue home statin.  - He is on warfarin so no ASA.  - No s/s angina 5. PAD: H/o L=>R fem-fem crossover grafting, had to be removed due to infection and had patch angioplasty.  6. AKI on CKD stage 3: Initial creatinine 1.9. has been stable in the 1.5 range. Repeat BMP pending. Follow with diuresis.  8. H/o acute cholecystitis: Has cholecystostomy tube from 11/21.  7. GOC: prognosis is poor. CCM & palliative care family meeting, continue full support with goal to get him discharged to  home.   Length of Stay: [address] PA-C  05/30/2020, 8:35 AM  Advanced Heart Failure Team Pager [phone] (M-F; 7a - 4p)  Please contact [facility] for night-coverage after hours (4p -7a ) and weekends on [email or web address]  Patient seen with PA, agree with the above note.   He is alert/oriented today, breathing improving.  However, remains on 30L HFNC, Bipap at night.  CVP 15-16.  Diuresing well on current regimen but still volume overloaded.   General: NAD Neck: JVP 14-16 cm, no thyromegaly or thyroid nodule.  Lungs: Decreased at bases.  CV: Nondisplaced PMI.  Heart regular S1/S2, no S3/S4, no murmur.  1+ edema to thighs. Abdomen: Soft, nontender, no hepatosplenomegaly, no distention.  Skin: Intact without lesions or rashes.  Neurologic: Alert and oriented x 3.  Psych: Normal affect. Extremities: No clubbing or cyanosis.  HEENT: Normal.    Primary RV failure due to OHS/OSA (severe).  Still volume overloaded but diuresing well. - Continue current diuretic regimen, pending BMET today.   Persistent hypercarbia/hypoxemia, mental status improved.  Requiring 30 L HFNC and Bipap at times.  Suspect end stage OHS/OSA.  Does not want trach.  - Continue full support for now, goal to get him discharged to home with knowledge that prognosis long-term is poor.   Marca Ancona 05/30/2020 9:07 AM

## 2020-05-30 NOTE — Progress Notes (Signed)
NAME:  Isaiah [name], MRN:  [id number], DOB:  August 20, 1940, LOS: 10 ADMISSION DATE:  05/20/2020, CONSULTATION DATE:  05/22/2020 REFERRING MD:  Dr. Antony Contras, CHIEF COMPLAINT:  Respiratory arrest    Brief History:  80 year old male presented with progressive shortness of breath and hypoxia with bilateral lower extremity swelling on admission.  Admitted per teaching service for acute CHF exasperation and volume overload.  Morning of 05/22/2020 patient suffered brief respiratory arrest for which PCCM was consulted for further management  Past Medical History:  Diastolic congestive heart failure, type 2 diabetes, prior CVA, atrial fibrillation on chronic anticoagulation, obstructive sleep apnea, hyperlipidemia, hypertension, CAD, and CKD stage III   Significant Hospital Events:  Admitted 1/7 1/9 critical care consulted for near respiratory arrest placed on BiPAP responded to noninvasive support and diuretics  1/10: Improving oxygen requirements improving 1/11: Has had 2 more isolated episodes of hypoxia with saturations as low as the 40s typically happening after positional change responding well to positive pressure via noninvasive mask Lasix dosing tripled added spironolactone, heart failure team evaluated and started Lasix drip 1/11: Making progress from a fluid volume standpoint but fatigued, still requiring BiPAP.  Overall clinically actually looks worse.  Started on Lasix drip after advanced heart failure team evaluation 1/12: Required BiPAP most of the day 1/13: Now down to 10 L from fluid volume standpoint after adjusting diuretics PICC line placed.  Goals of care discussion had with the patient, his daughter and son.  They still desire full aggressive scope of care including intubation, ventilation, and CPR should the situation arise.  They do however acknowledge that the patient would not want prolonged mechanical ventilation, also would not be interested in dialysis.  1/16: Net negative 18L since  admission with aggressive diuresis. Remains volume overloaded with no change in FIO2 requirement of 50-60%  Consults:    Procedures:  Echocardiogram 1/9: Anterior septal hypokinesis with LV EF now 45 to 50% with decreased LV function.  Unable to determine diastolic function.  But there was interventricular septal flattening in systole and diastole consistent with right ventricular volume overload.  There is severely elevated pulmonary artery systolic pressure.  Significant Diagnostic Tests:  CXR 1/7 >Worsening CHF with bilateral effusions left greater than right.  Micro Data:  COVID 1/7 > Negative  RVP 1/7 > negative  Antimicrobials:     Interim History / Subjective:  Wore BiPAP overnight for rest. Improved oxygen requirement with FIO2 to 50% today. Patient awake and alert.  Objective   Blood pressure 129/63, pulse 76, temperature 99 F (37.2 C), temperature source Oral, resp. rate (!) 22, height 5\' 9"  (1.753 m), weight 100.2 kg, SpO2 95 %. CVP:  [15 mmHg-37 mmHg] 15 mmHg  FiO2 (%):  [30 %-60 %] 50 %   Intake/Output Summary (Last 24 hours) at 05/30/2020 0904 Last data filed at 05/30/2020 0800 Gross per 24 hour  Intake 978.84 ml  Output 5750 ml  Net -4771.16 ml  CVP:  [15 mmHg-37 mmHg] 15 mmHg Filed Weights   05/28/20 0500 05/29/20 0500 05/30/20 0500  Weight: 104 kg 103.4 kg 100.2 kg   Physical Exam: General: Obese, chronically ill-appearing, no acute distress HENT: [place], AT, OP clear, MMM Eyes: EOMI, no scleral icterus Respiratory: Diminished breath sounds bilaterally.  No crackles, wheezing or rales Cardiovascular: RRR, -M/R/G, no JVD GI: BS+, soft, nontender, percutaneous drain in place with minimal bilious output Extremities:-Edema,-tenderness Neuro: AAO x4, CNII-XII grossly intact   Resolved Hospital Problem list   Brief respiratory arrest 1/9  Assessment & Plan:  Acute respiratory failure with hypoxemia and hypercapnia secondary to cor pulmonale and OSA/OHS Net  negative 22L since admission with aggressive diuresis. FIO2 improved to 50% today --Started on lasix gtt on 1/11. Improved CVP today --Transition from heated high flow to HFNC. If tolerates, ok to transfer out of ICU from Pulmonary standpoint. Will defer decision to primary team. --Continue BiPAP nightly --Heart failure team consulted. Diuretics (diamox, spironolactone and lasix) per team --Daily potassium  Acute on chronic kidney failure stage IIIa, stable Cr 1.45-1.55 --Monitor UOP/Cr --Foley --Strict I&O --Renal dose meds  Acute toxic metabolic encephalopathy secondary to hypercarbia, medications. Improved --Reduced zyprexa dosing 1/13 --Decrease gabapentin 1/15 --Minimize sedating meds --BiPAP nightly  Chronic atrial fibrillation --Home medications include Coumadin --Telemetry  Type 2 diabetes --SSI  Prior acute cholecystitis status post percutaneous drain placed on 11/3 --Cont trend output  --Consider surgery consult for definitive management when stable  Best practice (evaluated daily)  Diet: Heart healthy Pain/Anxiety/Delirium protocol (if indicated): As needed  VAP protocol (if indicated): N/A DVT prophylaxis: Coumadin  GI prophylaxis: PPI Glucose control: SSI, changed to [facility] at bedtime 1/10 Mobility: Bedrest get out of bed 1/10 Disposition: we will continue monitoring in the intensive care if we can get him to less than 50% heated high flow I think we can move him out  Goals of Care:  Last date of multidisciplinary goals of care discussion: This was completed on 1/13, this included nurse practitioner and bedside nursing staff in addition to family listed below Family and staff present: [name], his daughter and Moshe Junior his son in addition to the patient himself who was able to participate Summary of discussion: The patient, and his family continue to desire full scope of medical care this would include CPR should he suffer a cardiac arrest.  They do  however concede that he would not want prolonged life support, tracheostomy, or hemodialysis. Follow up goals of care discussion due: 1/20 Code Status: Full   Critical care time: 32 minutes    The patient is critically ill with multiple organ systems failure and requires high complexity decision making for assessment and support, frequent evaluation and titration of therapies, application of advanced monitoring technologies and extensive interpretation of multiple databases.  Independent Critical Care Time: 32 Minutes.   Mechele Collin, M.D. [facility] Pulmonary/Critical Care Medicine 05/30/2020 9:04 AM   Please see Amion for pager number to reach on-call Pulmonary and Critical Care Team.

## 2020-05-30 NOTE — Progress Notes (Signed)
Physical Therapy Treatment Patient Details Name: Isaiah [name] MRN: [id number] DOB: 11-26-1940 Today's Date: 05/30/2020    History of Present Illness 80 year old male presented with progressive shortness of breath and hypoxia with bilateral lower extremity swelling on admission.  Admitted per teaching service for acute CHF exacerbation and volume overload.  Morning of 05/22/2020 patient suffered brief respiratory arrest for which PCCM was consulted for further management.  PMHx:  d CHF, DM2. (ropr CVA. afob. PSA. JTM. CAD. CLD stage 3.    PT Comments    Pt progressing well toward goals, but still needing significant oxygen assist to be able to tolerate activity.  Emphasis on warm up exercise, transition to EOB, sit to stand, transfer to the chair, then ambulation away from and back to the chair.  Pt declined further today.   Follow Up Recommendations  Home health PT;Supervision/Assistance - 24 hour     Equipment Recommendations   (BSC  TBD)    Recommendations for Other Services       Precautions / Restrictions Precautions Precautions: Fall;Other (comment) Precaution Comments: watch O2    Mobility  Bed Mobility Overal bed mobility: Needs Assistance Bed Mobility: Supine to Sit     Supine to sit: Mod assist     General bed mobility comments: cues for direction, mod assist up and min assist during scooting to EOB  Transfers Overall transfer level: Needs assistance Equipment used: Rolling walker (2 wheeled) Transfers: Sit to/from Stand Sit to Stand: Mod assist Stand pivot transfers: Min assist;+2 safety/equipment (line management)       General transfer comment: cues for hand placement and assist forward with minimal boost.  Ambulation/Gait Ambulation/Gait assistance: Min assist;+2 physical assistance (lines) Gait Distance (Feet): 6 Feet (forward and back from recliner) Assistive device: Rolling walker (2 wheeled) Gait Pattern/deviations: Step-through pattern      General Gait Details: short tentative steps, steady assist and some maneuvering of the RW   Stairs             Wheelchair Mobility    Modified Rankin (Stroke Patients Only)       Balance Overall balance assessment: Needs assistance Sitting-balance support: Feet supported;No upper extremity supported;Single extremity supported Sitting balance-Leahy Scale: Fair     Standing balance support: Bilateral upper extremity supported Standing balance-Leahy Scale: Poor Standing balance comment: reliant on the RW and/or external support                            Cognition Arousal/Alertness: Awake/alert Behavior During Therapy: WFL for tasks assessed/performed Overall Cognitive Status: Impaired/Different from baseline (NT formally)                                 General Comments: following commands given time and given loudly.      Exercises Other Exercises Other Exercises: warm up hip/knee AAROM flex/graded resistance ext x10 bil.    General Comments General comments (skin integrity, edema, etc.): O 30 L HHFNC at FiO2 50%, sats generally stayed 92-94%, but dipped x1 to 87% before slow return to 92%  HR 100's      Pertinent Vitals/Pain Pain Assessment: Faces Faces Pain Scale: No hurt Pain Intervention(s): Monitored during session    Home Living                      Prior Function  PT Goals (current goals can now be found in the care plan section) Acute Rehab PT Goals PT Goal Formulation: With patient Time For Goal Achievement: 06/09/20 Potential to Achieve Goals: Fair Progress towards PT goals: Progressing toward goals    Frequency    Min 3X/week      PT Plan Current plan remains appropriate    Co-evaluation              AM-PAC PT "6 Clicks" Mobility   Outcome Measure  Help needed turning from your back to your side while in a flat bed without using bedrails?: A Little Help needed moving from  lying on your back to sitting on the side of a flat bed without using bedrails?: A Lot Help needed moving to and from a bed to a chair (including a wheelchair)?: A Little Help needed standing up from a chair using your arms (e.g., wheelchair or bedside chair)?: A Lot Help needed to walk in hospital room?: A Little Help needed climbing 3-5 steps with a railing? : A Lot 6 Click Score: 15    End of Session   Activity Tolerance: Patient tolerated treatment well;Patient limited by fatigue Patient left: in chair;with call bell/phone within reach;with chair alarm set Nurse Communication: Mobility status PT Visit Diagnosis: Other abnormalities of gait and mobility (R26.89);Muscle weakness (generalized) (M62.81);Difficulty in walking, not elsewhere classified (R26.2)     Time: 1607-3710 PT Time Calculation (min) (ACUTE ONLY): 23 min  Charges:  $Gait Training: 8-22 mins $Therapeutic Activity: 8-22 mins                     05/30/2020  Jacinto Halim., PT Acute Rehabilitation Services [phone]  (pager) [phone]  (office)   Eliseo Gum Tenita Cue 05/30/2020, 2:06 PM

## 2020-05-30 NOTE — Progress Notes (Signed)
RT attempted to take patient off Bipap and place on a [place] HFNC at 15L.  Patient desated to 79%.  Patient placed back on Heated HFNC 30L, 50%, sats improved to 95%.

## 2020-05-30 NOTE — Progress Notes (Signed)
ANTICOAGULATION CONSULT NOTE  Pharmacy Consult for warfarin Indication: atrial fibrillation  No Known Allergies  Patient Measurements: Height: 5\' 9"  (175.3 cm) Weight: 100.2 kg (220 lb 14.4 oz) IBW/kg (Calculated) : 70.7  Vital Signs: Temp: 98.1 F (36.7 C) (01/17 1200) Temp Source: Oral (01/17 1200) BP: 118/81 (01/17 1200) Pulse Rate: 80 (01/17 1200)  Labs: Recent Labs    05/28/20 0610 05/29/20 0504 05/30/20 0455 05/30/20 0849  HGB 9.2* 9.3* 9.4*  --   HCT 30.6* 32.1* 32.2*  --   PLT 167 150 167  --   LABPROT 30.2* 28.8* 27.6*  --   INR 3.0* 2.8* 2.7*  --   CREATININE 1.49* 1.55*  --  1.68*    Estimated Creatinine Clearance: 41.6 mL/min (A) (by C-G formula based on SCr of 1.68 mg/dL (H)).   Assessment: 80 year old male admitted for HF exacerbation. History of afib on warfarin, home dose is 6 mg daily.   INR remains therapeutic at 2.7 after reduced doses. Hgb 9.4, plt 167. No s/sx of bleeding. Oral intake documented at 100%.   Goal of Therapy:  INR 2-3 Monitor platelets by anticoagulation protocol: Yes   Plan:  Warfarin 5 mg PO today Daily PT/INR   66, PharmD, BCCCP Clinical Pharmacist  Phone: [phone] 05/30/2020 1:18 PM  Please check AMION for all [facility] Pharmacy phone numbers After 10:00 PM, call Main Pharmacy [phone]

## 2020-05-31 DIAGNOSIS — I5033 Acute on chronic diastolic (congestive) heart failure: Secondary | ICD-10-CM | POA: Diagnosis not present

## 2020-05-31 DIAGNOSIS — J9601 Acute respiratory failure with hypoxia: Secondary | ICD-10-CM | POA: Diagnosis not present

## 2020-05-31 DIAGNOSIS — J9622 Acute and chronic respiratory failure with hypercapnia: Secondary | ICD-10-CM | POA: Diagnosis not present

## 2020-05-31 DIAGNOSIS — I50810 Right heart failure, unspecified: Secondary | ICD-10-CM | POA: Diagnosis not present

## 2020-05-31 DIAGNOSIS — J9621 Acute and chronic respiratory failure with hypoxia: Secondary | ICD-10-CM | POA: Diagnosis not present

## 2020-05-31 LAB — BASIC METABOLIC PANEL
Anion gap: 14 (ref 5–15)
BUN: 40 mg/dL — ABNORMAL HIGH (ref 8–23)
CO2: 43 mmol/L — ABNORMAL HIGH (ref 22–32)
Calcium: 9 mg/dL (ref 8.9–10.3)
Chloride: 75 mmol/L — ABNORMAL LOW (ref 98–111)
Creatinine, Ser: 1.62 mg/dL — ABNORMAL HIGH (ref 0.61–1.24)
GFR, Estimated: 43 mL/min — ABNORMAL LOW (ref >60)
Glucose, Bld: 135 mg/dL — ABNORMAL HIGH (ref 70–99)
Potassium: 3.3 mmol/L — ABNORMAL LOW (ref 3.5–5.1)
Sodium: 132 mmol/L — ABNORMAL LOW (ref 135–145)

## 2020-05-31 LAB — CBC
HCT: 30.4 % — ABNORMAL LOW (ref 39.0–52.0)
Hemoglobin: 9.4 g/dL — ABNORMAL LOW (ref 13.0–17.0)
MCH: 30.6 pg (ref 26.0–34.0)
MCHC: 30.9 g/dL (ref 30.0–36.0)
MCV: 99 fL (ref 80.0–100.0)
Platelets: 162 10*3/uL (ref 150–400)
RBC: 3.07 MIL/uL — ABNORMAL LOW (ref 4.22–5.81)
RDW: 15.4 % (ref 11.5–15.5)
WBC: 7 10*3/uL (ref 4.0–10.5)
nRBC: 0 % (ref 0.0–0.2)

## 2020-05-31 LAB — GLUCOSE, CAPILLARY
Glucose-Capillary: 113 mg/dL — ABNORMAL HIGH (ref 70–99)
Glucose-Capillary: 191 mg/dL — ABNORMAL HIGH (ref 70–99)
Glucose-Capillary: 141 mg/dL — ABNORMAL HIGH (ref 70–99)
Glucose-Capillary: 144 mg/dL — ABNORMAL HIGH (ref 70–99)
Glucose-Capillary: 149 mg/dL — ABNORMAL HIGH (ref 70–99)

## 2020-05-31 LAB — PROTIME-INR
INR: 2.7 — ABNORMAL HIGH (ref 0.8–1.2)
Prothrombin Time: 27.7 seconds — ABNORMAL HIGH (ref 11.4–15.2)

## 2020-05-31 LAB — MAGNESIUM: Magnesium: 2 mg/dL (ref 1.7–2.4)

## 2020-05-31 LAB — CALCIUM, IONIZED: Calcium, Ionized, Serum: 4.7 mg/dL (ref 4.5–5.6)

## 2020-05-31 MED ORDER — POTASSIUM CHLORIDE CRYS ER 20 MEQ PO TBCR
20.0000 meq | EXTENDED_RELEASE_TABLET | Freq: Once | ORAL | Status: AC
  Administered 2020-05-31: 20 meq via ORAL
  Filled 2020-05-31: qty 1

## 2020-05-31 MED ORDER — ISOSORBIDE MONONITRATE ER 30 MG PO TB24
30.0000 mg | ORAL_TABLET | Freq: Every day | ORAL | Status: DC
  Administered 2020-06-01: 30 mg via ORAL
  Filled 2020-05-31: qty 1

## 2020-05-31 MED ORDER — WARFARIN SODIUM 5 MG PO TABS
5.0000 mg | ORAL_TABLET | Freq: Once | ORAL | Status: AC
  Administered 2020-05-31: 5 mg via ORAL
  Filled 2020-05-31: qty 1

## 2020-05-31 MED ORDER — EMPAGLIFLOZIN 10 MG PO TABS
10.0000 mg | ORAL_TABLET | Freq: Every day | ORAL | Status: DC
  Administered 2020-05-31 – 2020-06-10 (×11): 10 mg via ORAL
  Filled 2020-05-31 (×11): qty 1

## 2020-05-31 MED ORDER — HYDRALAZINE HCL 25 MG PO TABS
25.0000 mg | ORAL_TABLET | Freq: Two times a day (BID) | ORAL | Status: DC
  Administered 2020-05-31 – 2020-06-02 (×4): 25 mg via ORAL
  Filled 2020-05-31 (×4): qty 1

## 2020-05-31 MED ORDER — POTASSIUM CHLORIDE CRYS ER 20 MEQ PO TBCR
40.0000 meq | EXTENDED_RELEASE_TABLET | Freq: Every day | ORAL | Status: DC
  Administered 2020-05-31: 40 meq via ORAL
  Filled 2020-05-31: qty 2

## 2020-05-31 NOTE — Progress Notes (Signed)
NAME:  Isaiah [name], MRN:  [id number], DOB:  [birth year], LOS: 11 ADMISSION DATE:  05/20/2020, CONSULTATION DATE:  05/22/2020 REFERRING MD:  Dr. Antony [name], CHIEF COMPLAINT:  Respiratory arrest    Brief History:  80 year old male presented with progressive shortness of breath and hypoxia with bilateral lower extremity swelling on admission.  Admitted per teaching service for acute CHF exasperation and volume overload.  Morning of 05/22/2020 patient suffered brief respiratory arrest for which PCCM was consulted for further management  Past Medical History:  Diastolic congestive heart failure, type 2 diabetes, prior CVA, atrial fibrillation on chronic anticoagulation, obstructive sleep apnea, hyperlipidemia, hypertension, CAD, and CKD stage III   Significant Hospital Events:  Admitted 1/7 1/9 critical care consulted for near respiratory arrest placed on BiPAP responded to noninvasive support and diuretics  1/10: Improving oxygen requirements improving 1/11: Has had 2 more isolated episodes of hypoxia with saturations as low as the 40s typically happening after positional change responding well to positive pressure via noninvasive mask Lasix dosing tripled added spironolactone, heart failure team evaluated and started Lasix drip 1/11: Making progress from a fluid volume standpoint but fatigued, still requiring BiPAP.  Overall clinically actually looks worse.  Started on Lasix drip after advanced heart failure team evaluation 1/12: Required BiPAP most of the day 1/13: Now down to 10 L from fluid volume standpoint after adjusting diuretics PICC line placed.  Goals of care discussion had with the patient, his daughter and son.  They still desire full aggressive scope of care including intubation, ventilation, and CPR should the situation arise.  They do however acknowledge that the patient would not want prolonged mechanical ventilation, also would not be interested in dialysis.  1/16: Net negative 18L since  admission with aggressive diuresis. Remains volume overloaded with no change in FIO2 requirement of 50-60%  Consults:    Procedures:  Echocardiogram 1/9: Anterior septal hypokinesis with LV EF now 45 to 50% with decreased LV function.  Unable to determine diastolic function.  But there was interventricular septal flattening in systole and diastole consistent with right ventricular volume overload.  There is severely elevated pulmonary artery systolic pressure.  Significant Diagnostic Tests:  CXR 1/7 >Worsening CHF with bilateral effusions left greater than right.  Micro Data:  COVID 1/7 > Negative  RVP 1/7 > negative  Antimicrobials:     Interim History / Subjective:  Continues to need BiPAP at night. Clinically no complaints with patient starting to ambulate to chair.   Objective   Blood pressure (!) 117/59, pulse 82, temperature 98.7 F (37.1 C), temperature source Oral, resp. rate 19, height 5\' 9"  (1.753 m), weight 100.2 kg, SpO2 99 %. CVP:  [10 mmHg-29 mmHg] 10 mmHg  FiO2 (%):  [30 %-50 %] 50 %   Intake/Output Summary (Last 24 hours) at 05/31/2020 1216 Last data filed at 05/31/2020 1200 Gross per 24 hour  Intake 1558.87 ml  Output 4655 ml  Net -3096.13 ml  CVP:  [10 mmHg-29 mmHg] 10 mmHg Filed Weights   05/28/20 0500 05/29/20 0500 05/30/20 0500  Weight: 104 kg 103.4 kg 100.2 kg   Physical Exam: General: Obese, chronically ill-appearing, no acute distress HENT: [place], AT, OP clear, MMM Eyes: EOMI, no scleral icterus Respiratory:  Crackles at both bases.  Cardiovascular: RRR, No signs of RV strain. JVP at 4cm.  GI: BS+, soft, nontender, percutaneous drain in place with minimal bilious output Extremities: edema now much better.  Neuro: AAO x4, CNII-XII grossly intact  Resolved Hospital Problem list  Brief respiratory arrest 1/9  Assessment & Plan:  Acute respiratory failure with hypoxemia and hypercapnia secondary to cor pulmonale and OSA/OHS and decompensated  HFpEF - Continue nocturnal BIPAP - Wean FiO2 as tolerated - Incentive spirometry and mobilization for atelectasis component.   Acute decompensated HFpEF - Edema much improved, creatinine rising and alkalosis not improved with acetazolamide.  - Decrease furosemide infusion to 10mg /h with plan to convert to intermittent dosing/maintenance dose in next few days. BNP to help guide therapy.    Acute on chronic kidney failure stage IIIa, stable Cr 1.45-1.55 --Monitor UOP/Cr --Foley --Strict I&O --Renal dose meds  Acute toxic metabolic encephalopathy secondary to hypercarbia, medications. Improved --Reduced zyprexa dosing 1/13 --Decrease gabapentin 1/15 --Minimize sedating meds --BiPAP nightly  Chronic atrial fibrillation --Home medications include Coumadin --Telemetry  Type 2 diabetes --SSI  Prior acute cholecystitis status post percutaneous drain placed on 11/3 --Cont trend output  --Consider surgery consult for definitive management when stable  Best practice (evaluated daily)  Diet: Heart healthy Pain/Anxiety/Delirium protocol (if indicated): As needed  VAP protocol (if indicated): N/A DVT prophylaxis: Coumadin  GI prophylaxis: PPI Glucose control: SSI, changed to AC at bedtime 1/10 Mobility: Progressive ambulation.  Disposition: Continue ICU care until oxygenation improves.   Goals of Care:  Last date of multidisciplinary goals of care discussion: This was completed on 1/13, this included nurse practitioner and bedside nursing staff in addition to family listed below Family and staff present: 2/13, his daughter and Isaiah [name] his son in addition to the patient himself who was able to participate Summary of discussion: The patient, and his family continue to desire full scope of medical care this would include CPR should he suffer a cardiac arrest.  They do however concede that he would not want prolonged life support, tracheostomy, or hemodialysis. Follow up  goals of care discussion due: 1/20 Code Status: Full   CRITICAL CARE Performed by: 2/20   Total critical care time: 40 minutes  Critical care time was exclusive of separately billable procedures and treating other patients.  Critical care was necessary to treat or prevent imminent or life-threatening deterioration.  Critical care was time spent personally by me on the following activities: development of treatment plan with patient and/or surrogate as well as nursing, discussions with consultants, evaluation of patient's response to treatment, examination of patient, obtaining history from patient or surrogate, ordering and performing treatments and interventions, ordering and review of laboratory studies, ordering and review of radiographic studies, pulse oximetry, re-evaluation of patient's condition and participation in multidisciplinary rounds.  Lynnell Catalan, MD [facility] ICU Physician [facility] [place] [facility]  Pager: [phone] Mobile: [phone] After hours: [phone]. ,

## 2020-05-31 NOTE — Progress Notes (Addendum)
Advanced Heart Failure Rounding Note  PCP-Cardiologist: Nanetta Batty, MD    Patient Profile:    80 y/o male w/ CAD, Afib, Stage III CKD and severe chronic hypoxic/hypercarbic respiratory failure due to OSA/OHS with baseline pCO2 ~ 80. Now with severe RV failure and massive volume overload.   Subjective:    Good diuresis again yesterday, -5.4 L in UOP. I/Os net negative 4L.  No wt charted for today but has diuresed  diuresed >30 lb this admit. SCr stable at 1.62. K 3.3   CVP remains elevated 15-16 but less O2 requirements. HFNC has been weaned from 30>>20L/min. O2 sats 94%.   CO2 elevated at 43  A&Ox3.     Objective:   Weight Range: 100.2 kg Body mass index is 32.62 kg/m.   Vital Signs:   Temp:  [97.7 F (36.5 C)-98.1 F (36.7 C)] 98.1 F (36.7 C) (01/18 0400) Pulse Rate:  [71-85] 77 (01/18 0700) Resp:  [13-25] 25 (01/18 0700) BP: (97-153)/(49-94) 117/54 (01/18 0700) SpO2:  [91 %-100 %] 95 % (01/18 0700) FiO2 (%):  [30 %-50 %] 50 % (01/18 0734) Last BM Date: 05/30/20  Weight change: Filed Weights   05/28/20 0500 05/29/20 0500 05/30/20 0500  Weight: 104 kg 103.4 kg 100.2 kg    Intake/Output:   Intake/Output Summary (Last 24 hours) at 05/31/2020 0816 Last data filed at 05/31/2020 0600 Gross per 24 hour  Intake 1148.76 ml  Output 5425 ml  Net -4276.24 ml      Physical Exam    CVP 15-16 General:  Obese, fatigue/ chronically ill appearing elderly WM sitting up in bed on HFNC No respiratory difficulty HEENT: normal Neck: supple. JVD elevated to jaw. Carotids 2+ bilat; no bruits. No lymphadenopathy or thyromegaly appreciated. Cor: PMI nondisplaced. Irregular  Rhythm and rate. No rubs, gallops or murmurs. Lungs: decreased BS at the bases, no wheezing   Abdomen: soft, nontender, nondistended. No hepatosplenomegaly. No bruits or masses. Good bowel sounds. Extremities: no cyanosis, clubbing, rash, edema Neuro: alert & oriented x 3, cranial nerves grossly  intact. moves all 4 extremities w/o difficulty. Affect pleasant.   Telemetry   Afib w/ occasional PVCs 80s. Personally reviewed  Labs    CBC Recent Labs    05/30/20 0455 05/31/20 0500  WBC 7.6 7.0  HGB 9.4* 9.4*  HCT 32.2* 30.4*  MCV 102.2* 99.0  PLT 167 162   Basic Metabolic Panel Recent Labs    93/73/42 0849 05/31/20 0500  NA 134* 132*  K 3.5 3.3*  CL 78* 75*  CO2 43* 43*  GLUCOSE 159* 135*  BUN 37* 40*  CREATININE 1.68* 1.62*  CALCIUM 9.3 9.0  MG 2.0 2.0   Liver Function Tests No results for input(s): AST, ALT, ALKPHOS, BILITOT, PROT, ALBUMIN in the last 72 hours. No results for input(s): LIPASE, AMYLASE in the last 72 hours. Cardiac Enzymes No results for input(s): CKTOTAL, CKMB, CKMBINDEX, TROPONINI in the last 72 hours.  BNP: BNP (last 3 results) Recent Labs    03/14/20 0752 03/22/20 0117 05/20/20 1133  BNP 532.7* 441.2* 618.9*    ProBNP (last 3 results) No results for input(s): PROBNP in the last 8760 hours.   D-Dimer No results for input(s): DDIMER in the last 72 hours. Hemoglobin A1C No results for input(s): HGBA1C in the last 72 hours. Fasting Lipid Panel No results for input(s): CHOL, HDL, LDLCALC, TRIG, CHOLHDL, LDLDIRECT in the last 72 hours. Thyroid Function Tests No results for input(s): TSH, T4TOTAL, T3FREE, THYROIDAB in  the last 72 hours.  Invalid input(s): FREET3  Other results:   Imaging    No results found.   Medications:     Scheduled Medications: . acetaZOLAMIDE  500 mg Oral BID  . allopurinol  100 mg Oral BID  . chlorhexidine  15 mL Mouth Rinse BID  . Chlorhexidine Gluconate Cloth  6 each Topical Daily  . ferrous sulfate  325 mg Oral Q breakfast  . gabapentin  100 mg Oral TID  . hydrALAZINE  100 mg Oral BID  . insulin aspart  0-15 Units Subcutaneous TID WC  . isosorbide mononitrate  60 mg Oral Daily  . mouth rinse  15 mL Mouth Rinse q12n4p  . metolazone  5 mg Oral Daily  . OLANZapine zydis  5 mg Oral QHS   . pantoprazole  40 mg Oral BID AC  . polyethylene glycol  17 g Oral BID  . potassium chloride SA  20 mEq Oral Daily  . pravastatin  40 mg Oral QHS  . senna  1 tablet Oral Daily  . sodium chloride flush  10-40 mL Intracatheter Q12H  . sodium chloride flush  3 mL Intravenous Q12H  . spironolactone  50 mg Oral Daily  . Warfarin - Pharmacist Dosing Inpatient   Does not apply q1600    Infusions: . sodium chloride    . furosemide (LASIX) 200 mg in dextrose 5% 100 mL (2mg /mL) infusion 20 mg/hr (05/31/20 0600)    PRN Medications: sodium chloride, acetaminophen, bisacodyl, ondansetron (ZOFRAN) IV, sodium chloride flush, sodium chloride flush   Assessment/Plan   1. Acute on chronic heart failure with mid-range EF and severe RV failure:  Echo this admission with EF 45-50%, anteroseptal HK, D-shaped septum with PASP 63 mmHg and dilated IVC.  During 11/21 admission, initial echo showed EF 20% with severe RV dysfunction.  Repeat echo later in hospitalization with EF up to 55% and mild RV dysfunction.  Thought to have stress/Takotsubo cardiomyopathy in setting of septic shock/cholecystitis. Co-ox has been ok. Continues to diurese well - improving with diuresis but remains volume overloaded. CVP 15-16. SCr stable - continue lasix gtt at 20 hr +  acetazolamide 500 bid + spironolactone 50 mg daily + metolazone 5 mg daily.   - no UNNA boots w/ severe PAD. Continue w/ TED hoses  2. Acute on Chronic Hypoxic Respiratory Failure due to end-stage OSA/OHS baseline pCO2 85-95 range - Continue BiPAP support.  - This is truly end-stage. Need to consider tracheostomy but he refuses. - CCM managing 3. Atrial fibrillation: Chronic, rate is controlled. H/o CVA.  - Continue warfarin, INR therapeutic at 2.7 4. CAD: Anterior MI 1994, CABG 1997 with LIMA-LAD and seq SVG-OM/D.  Had PCI to proximal RCA in 2018.  No chest pain. EF has generally been in the 50% range (45-50% on echo this admission). HS-TnI minimally  elevated with no trend this admission, ACS is unlikely.  - Continue home statin.  - He is on warfarin so no ASA.  - No s/s angina 5. PAD: H/o L=>R fem-fem crossover grafting, had to be removed due to infection and had patch angioplasty.  6. AKI on CKD stage 3: Initial creatinine 1.9. has been stable in the 1.5-1.6 range. Follow with diuresis.  8. H/o acute cholecystitis: Has cholecystostomy tube from 11/21.  7. GOC: prognosis is poor. CCM & palliative care family meeting, continue full support with goal to get him discharged to home.  8. Hypokalemia - K 3.3  - supp w/ KCl, increase to  40 mEq daily   Length of Stay: [address] PA-C  05/31/2020, 8:16 AM  Advanced Heart Failure Team Pager [phone] (M-F; 7a - 4p)  Please contact [facility] for night-coverage after hours (4p -7a ) and weekends on [email or web address]  Patient seen with PA, agree with the above note.   I/Os markedly negative again with weight down.  He is back on bipap this morning with increased work of breathing.  On my measurement, CVP 10.   Creatinine stable at 1.62. SBP in 100s-110s this morning.   General: NAD, on bipap Neck: JVP 10 cm, no thyromegaly or thyroid nodule.  Lungs: Clear to auscultation bilaterally with normal respiratory effort. CV: Nondisplaced PMI.  Heart irregular S1/S2, no S3/S4, no murmur.  1+ ankle edema (improved).   Abdomen: Soft, nontender, no hepatosplenomegaly, no distention.  Skin: Intact without lesions or rashes.  Neurologic: Alert and oriented x 3.  Psych: Normal affect. Extremities: No clubbing or cyanosis.  HEENT: Normal.   Agree with decreasing Lasix to 10 mg/hr today, continue current acetazolamide/metolazone/spironolactone.  May be able to convert to po tomorrow if CVP keeps coming down. Weight is now down about 30 lbs.  Will add Jardiance 10 mg daily for HF.   BP lower today.  Hold next dose of hydralazine and decrease to 25 mg tid.  Decrease Imdur to 30 mg daily.    OHS/OSA continues to play major role in his clinical picture.  Per CCM.   CRITICAL CARE Performed by: Marca Ancona  Total critical care time: 35 minutes  Critical care time was exclusive of separately billable procedures and treating other patients.  Critical care was necessary to treat or prevent imminent or life-threatening deterioration.  Critical care was time spent personally by me on the following activities: development of treatment plan with patient and/or surrogate as well as nursing, discussions with consultants, evaluation of patient's response to treatment, examination of patient, obtaining history from patient or surrogate, ordering and performing treatments and interventions, ordering and review of laboratory studies, ordering and review of radiographic studies, pulse oximetry and re-evaluation of patient's condition.   Marca Ancona 05/31/2020 10:17 AM

## 2020-05-31 NOTE — Hospital Course (Addendum)
Admitted 05/20/2020  Allergies: Patient has no known allergies. Pertinent Hx: HFpEF, II DM, CVA, A-fib on coumadin, HLD, HTN, CAD, CKD III ,Prior acute cholecystitis status post percutaneous drain placed on 11/3, chronic hypoxic/hypercarbic respiratory failure 2/d OSA/OHS on 2-3 li O2 at home,   80 y.o. male p/w SOB, volume overload  *Acute on chronic rep failure: OSA/OHS and HF exacerbation: Required BiPAP and ICU transfer due to brief near res arrest 1/9. Now on HFNC and BiPAP at night. Heart failure team is on board. On Lasix gtt, spironolactone, acetazolamide, metolazone.   *GOC: Full code and full scope of care but does not was tracheostomy or other prolonged life supports such as HD. F/u GOC discussion due 1/20  Consults: pccm, HF, palliative  Meds: lasix, spironolactone, metolazone, acetazolamide, allopurinol, hydralazine, gabapentin, SSI, olanzapine, Protonix, pravastatin, senna, warfarin VTE ppx: Warfarin IVF: None diet: HH CM   Transfer note:  80 year old male with past medical history of HFpEF, II DM, CVA, A-fib on coumadin, HLD, HTN, CAD, CKD stage III  OSA/OHS on 2-3 li O2 at home, presented with shortness of breath and volume overload, admitted on 1/7 for CHF exacerbation. His hospitalization was complicated with near respiratory arrest and patient was transferred to ICU. He was on BiPAP>HFNC.  IMTS will assume care tomorrow 1/19 from [facility] hospitalist team.  Acute on chronic hypoxic and hypercapnic respiratory failure: Acute on chronic HF with mid-range EF and sever RV failure OSA/OHS not compliant to C-PAP at home:  He had a brief near respiratory arrest on 1/9  He was admitted in ICU and was placed on BiPAP. (Did not require intubation). He meanwhile had 2 episodes of hypoxia that managed with NIPPV and diuresis. His HF exacerbation managed by heart failure team. Echo 1/9 showed Echocardiogram 1/9: LV EF 45 to 50% with ant. septal hypokinesis, and decreased LV function.    (Of note: On prior hospitalization (11/1-11/23/2021) and per echo 11/3, he found to have severe biventricular dysfunction (Severe RV failure and EF 20%)> that improved on last echo 1/9 to nl RV function and EF 45-50%. (Sepsis or Takotsubo CMP)  Patient has been on Lasix gtt, acetazolamide, metolazone and spironolactone. He has lost 30 lb this admission by diuresis as above. Cr stable at 1.6. He has been on HFNC 30>20 and BiPAP at night.   Recent acute cholecystitis with sepsis: S/p percutaneous drain placed on 11/3 (during prior admission).   Patient had a recent hospitalization for acute cholecystitis and sepsis that was complicated with brief cardiac arrest during IR procedure 11/2. Drain was successfully placed next day on 11/3/  Initial plan was cardiac clearance and then proceeding to cholecystectomy in 8 weeks. Given severity of his illness, he underwent percutaneous cholecystostomy by IR on 03/16/2020 and Abx therapy.   GOC: Per PCCM note from 1/18, GOC has been discussed with family who want full scope of care and full code but not prolonged life support, tracheostomy, or hemodialysis.    IMTS will assume care tomorrow 1/19 from [facility] hospitalist team.  Chevis Pretty, MD [id number] 05/31/2020, 10:04 AM Pager: [phone]      BMP Latest Ref Rng & Units 06/01/2020 05/31/2020 05/30/2020  Glucose 70 - 99 mg/dL 503(T) 465(K) 812(X)  BUN 8 - 23 mg/dL 51(Z) 00(F) 74(B)  Creatinine 0.61 - 1.24 mg/dL 4.49(Q) 7.59(F) 6.38(G)  Sodium 135 - 145 mmol/L 130(L) 132(L) 134(L)  Potassium 3.5 - 5.1 mmol/L 3.0(L) 3.3(L) 3.5  Chloride 98 - 111 mmol/L 72(L) 75(L) 78(L)  CO2 22 -  32 mmol/L 47(H) 43(H) 43(H)  Calcium 8.9 - 10.3 mg/dL 9.2 9.0 9.3   Mg 2.3  BNP: 275<618   ***********************************************************  Acute on chronic hypoxic and hypercapnic respiratory failure HFmrEF exacerbation OSA Presented volume up on exam and had pulmonary edema as well as  worsening bilateral pleural effusions on chest x-ray.  Last echo on 1/9 with LVEF 45 to 50%.  Had biventricular dysfunction on prior echo, but more recent shows normal RV function. OSA and likely OHS also contributing.  Initially managed on BiPAP then weaned off, but had a brief respiratory arrest on 1/9 for which was admitted to the ICU and placed back on BiPAP.  Ultimately did not require intubation.  Transferred out of ICU on 1/19.  BNP down to 275 from 618 on admission, and weight down 45 pounds.   - Heart failure team consulted, appreciate assistance - Continue*** - Right heart cath tomorrow - Fluid restriction - Hydralazine 25 mg twice daily - Jardiance 10 mg daily - BiPAP at night -Likely exchange home CPAP for BiPAP on discharge   Goals of care Goals of care were discussed with family on 1/18 and they want full scope of care and full code, but not prolonged life support, tracheostomy, or hemodialysis.  The understanding is a poor prognosis. - Appreciate palliative care consult   AKI on CKD 3 Creatinine was 1.90 on admission, now is***. Baseline around 1.2.  Likely prerenal/cardiorenal, but also in setting of diuresis. - Diuretics as above - Daily BMP - Avoid nephrotoxins   Recent acute cholecystitis complicated by sepsis Recent hospitalization for acute cholecystitis and sepsis complicated by brief cardiac arrest.  Initial plan for cardiac clearance and cholecystectomy in 8 weeks, but underwent IR Coley cystotomy for severity of illness on 11/12 and had a brief cardiac arrest during this procedure.   -Need to clarify plan before discharge   DM type II Home regimen of 70/30 50 units twice daily and glipizide 5 mg twice daily.  Currently fairly controlled with SSI. -Continue SSI   HTN -Continue home hydralazine 100 mg twice daily, Imdur 60 mg daily, and Lasix as above   A fib Currently in normal sinus rhythm*** -Continue home Coumadin   CAD PAD  had MI in 1994, CABG 1997  with LIMA-LAD and seq SVG-OD.  Had PCI to proximal RCA in 2018. Last catheterization in March 2018 with patent SVG-OD, patent LIMA to LAD, 80% stenosis of RCA which was stented. -Continue home pravastatin 40 mg, Imdur 60 mg, warfarin as above -No Unna boots given PAD

## 2020-05-31 NOTE — Progress Notes (Signed)
Physical Therapy Treatment Patient Details Name: Isaiah [name] MRN: [id number] DOB: 12-30-1940 Today's Date: 05/31/2020    History of Present Illness 80 year old male presented with progressive shortness of breath and hypoxia with bilateral lower extremity swelling on admission.  Admitted per teaching service for acute CHF exacerbation and volume overload.  Morning of 05/22/2020 patient suffered brief respiratory arrest for which PCCM was consulted for further management.  PMHx:  d CHF, DM2. (ropr CVA. afob. PSA. JTM. CAD. CLD stage 3.    PT Comments    Pt is slower to progress toward goals than expected originally.  Emphasis on transition, sit to stand and stability with gait In the RW.    Follow Up Recommendations  Home health PT;Supervision/Assistance - 24 hour;Other (comment) (may need therapy at SNF if doesn't improve as expected)     Equipment Recommendations   (BSC  TBD)    Recommendations for Other Services       Precautions / Restrictions Precautions Precautions: Fall;Other (comment) Precaution Comments: watch O2    Mobility  Bed Mobility Overal bed mobility: Needs Assistance Bed Mobility: Supine to Sit     Supine to sit: Mod assist     General bed mobility comments: Again, cues for direction, mod assist up and min assist during scooting to EOB  Transfers Overall transfer level: Needs assistance Equipment used: Rolling walker (2 wheeled) Transfers: Sit to/from Stand Sit to Stand: Mod assist         General transfer comment: cues for hand placement and assist forward with minimal boost.  Ambulation/Gait Ambulation/Gait assistance: +2 physical assistance;Mod assist (lines) Gait Distance (Feet): 8 Feet Assistive device: Rolling walker (2 wheeled) Gait Pattern/deviations: Step-through pattern Gait velocity: slower Gait velocity interpretation: <1.31 ft/sec, indicative of household ambulator General Gait Details: short tentative steps, steady assist and  some maneuvering of the RW.  Pt buckled uncontrolled x1, but standing in the RW regained and continued toward the chair.   Stairs             Wheelchair Mobility    Modified Rankin (Stroke Patients Only)       Balance Overall balance assessment: Needs assistance Sitting-balance support: Feet supported;No upper extremity supported;Single extremity supported Sitting balance-Leahy Scale: Fair     Standing balance support: Bilateral upper extremity supported Standing balance-Leahy Scale: Poor Standing balance comment: reliant on the RW and/or external support                            Cognition Arousal/Alertness: Awake/alert Behavior During Therapy: WFL for tasks assessed/performed Overall Cognitive Status: Impaired/Different from baseline (NT formally)                                 General Comments: following commands given time and given loudly.      Exercises      General Comments General comments (skin integrity, edema, etc.): On 20 L, FiO2 100%, SpO2 running 96%, HR in the 100's with activity, BP continued low.      Pertinent Vitals/Pain Pain Assessment: Faces Faces Pain Scale: No hurt Pain Intervention(s): Monitored during session    Home Living                      Prior Function            PT Goals (current goals can now be found in the care  plan section) Acute Rehab PT Goals PT Goal Formulation: With patient Time For Goal Achievement: 06/09/20 Potential to Achieve Goals: Fair Progress towards PT goals: Progressing toward goals    Frequency    Min 3X/week      PT Plan Current plan remains appropriate    Co-evaluation              AM-PAC PT "6 Clicks" Mobility   Outcome Measure  Help needed turning from your back to your side while in a flat bed without using bedrails?: A Little Help needed moving from lying on your back to sitting on the side of a flat bed without using bedrails?: A Lot Help  needed moving to and from a bed to a chair (including a wheelchair)?: A Little Help needed standing up from a chair using your arms (e.g., wheelchair or bedside chair)?: A Lot Help needed to walk in hospital room?: A Little Help needed climbing 3-5 steps with a railing? : A Lot 6 Click Score: 15    End of Session   Activity Tolerance: Patient tolerated treatment well;Patient limited by fatigue Patient left: in chair;with call bell/phone within reach;with chair alarm set Nurse Communication: Mobility status PT Visit Diagnosis: Other abnormalities of gait and mobility (R26.89);Muscle weakness (generalized) (M62.81);Difficulty in walking, not elsewhere classified (R26.2)     Time: 4742-5956 PT Time Calculation (min) (ACUTE ONLY): 17 min  Charges:  $Therapeutic Activity: 8-22 mins                     05/31/2020  Jacinto Halim., PT Acute Rehabilitation Services [phone]  (pager) [phone]  (office)   Eliseo Gum Jesslynn Kruck 05/31/2020, 6:18 PM

## 2020-05-31 NOTE — Progress Notes (Signed)
ANTICOAGULATION CONSULT NOTE  Pharmacy Consult for warfarin Indication: atrial fibrillation  No Known Allergies  Patient Measurements: Height: 5\' 9"  (175.3 cm) Weight: 100.2 kg (220 lb 14.4 oz) IBW/kg (Calculated) : 70.7  Vital Signs: Temp: 98.7 F (37.1 C) (01/18 0800) Temp Source: Oral (01/18 0800) BP: 111/76 (01/18 0800) Pulse Rate: 74 (01/18 0800)  Labs: Recent Labs    05/29/20 0504 05/30/20 0455 05/30/20 0849 05/31/20 0500  HGB 9.3* 9.4*  --  9.4*  HCT 32.1* 32.2*  --  30.4*  PLT 150 167  --  162  LABPROT 28.8* 27.6*  --  27.7*  INR 2.8* 2.7*  --  2.7*  CREATININE 1.55*  --  1.68* 1.62*    Estimated Creatinine Clearance: 43.1 mL/min (A) (by C-G formula based on SCr of 1.62 mg/dL (H)).   Assessment: 80 year old male admitted for HF exacerbation. History of afib on warfarin, home dose is 6 mg daily.   INR remains therapeutic at 2.7. Hgb 9.4, plt 162. No s/sx of bleeding. Oral intake documented at 75-100%.   Goal of Therapy:  INR 2-3 Monitor platelets by anticoagulation protocol: Yes   Plan:  Warfarin 5 mg PO tonight Daily PT/INR   66, PharmD, BCCCP Clinical Pharmacist  Phone: [phone] 05/31/2020 9:35 AM  Please check AMION for all [facility] Pharmacy phone numbers After 10:00 PM, call Main Pharmacy [phone]

## 2020-05-31 NOTE — Plan of Care (Signed)
  Problem: Health Behavior/Discharge Planning: Goal: Ability to manage health-related needs will improve Outcome: Progressing   Problem: Activity: Goal: Risk for activity intolerance will decrease Outcome: Progressing   Problem: Nutrition: Goal: Adequate nutrition will be maintained Outcome: Progressing   Problem: Pain Managment: Goal: General experience of comfort will improve Outcome: Progressing   Problem: Coping: Goal: Level of anxiety will decrease Outcome: Completed/Met

## 2020-05-31 NOTE — Progress Notes (Signed)
Transfer note:   80 year old male with past medical history of HFpEF, II DM, CVA, A-fib on coumadin, HLD, HTN, CAD, CKD stage III  OSA/OHS on 2-3 li O2 at home, presented with shortness of breath and volume overload, admitted on 1/7 for CHF exacerbation. His hospitalization was complicated with near respiratory arrest and patient was transferred to ICU. He was on BiPAP>HFNC.  IMTS will assume care tomorrow 1/19 from [facility] hospitalist team.  Acute on chronic hypoxic and hypercapnic respiratory failure: Acute on chronic HF with mid-range EF and sever RV failure OSA/OHS not compliant to C-PAP at home:  He had a brief near respiratory arrest on 1/9  He was admitted in ICU and was placed on BiPAP. (Did not require intubation). He meanwhile had 2 episodes of hypoxia that managed with NIPPV and diuresis. His HF exacerbation managed by heart failure team. Echo 1/9 showed Echocardiogram 1/9: LV EF 45 to 50% with ant. septal hypokinesis, and decreased LV function.   (Of note: On prior hospitalization (11/1-11/23/2021) and per echo 11/3, he found to have severe biventricular dysfunction (Severe RV failure and EF 20%)> that improved on last echo 1/9 to nl RV function and EF 45-50%. (Sepsis or Takotsubo CMP)  Patient has been on Lasix gtt, acetazolamide, metolazone and spironolactone. He has lost 30 lb this admission by diuresis as above. Cr stable at 1.6. He has been on HFNC 30>20 and BiPAP at night.   Recent acute cholecystitis with sepsis: S/p percutaneous drain placed on 11/3 (during prior admission).   Patient had a recent hospitalization for acute cholecystitis and sepsis that was complicated with brief cardiac arrest during IR procedure 11/2. Drain was successfully placed next day on 11/3/  Initial plan was cardiac clearance and then proceeding to cholecystectomy in 8 weeks. Given severity of his illness, he underwent percutaneous cholecystostomy by IR on 03/16/2020 and Abx therapy.   GOC: Per  PCCM note from 1/18, GOC has been discussed with family who want full scope of care and full code but not prolonged life support, tracheostomy, or hemodialysis.    IMTS will assume care tomorrow 1/19 from [facility] hospitalist team.  Chevis Pretty, MD [id number] 05/31/2020, 10:04 AM Pager: [phone]

## 2020-06-01 DIAGNOSIS — I5033 Acute on chronic diastolic (congestive) heart failure: Secondary | ICD-10-CM | POA: Diagnosis not present

## 2020-06-01 DIAGNOSIS — J9622 Acute and chronic respiratory failure with hypercapnia: Secondary | ICD-10-CM | POA: Diagnosis not present

## 2020-06-01 DIAGNOSIS — J9621 Acute and chronic respiratory failure with hypoxia: Secondary | ICD-10-CM | POA: Diagnosis not present

## 2020-06-01 LAB — BASIC METABOLIC PANEL
Anion gap: 11 (ref 5–15)
BUN: 44 mg/dL — ABNORMAL HIGH (ref 8–23)
CO2: 47 mmol/L — ABNORMAL HIGH (ref 22–32)
Calcium: 9.2 mg/dL (ref 8.9–10.3)
Chloride: 72 mmol/L — ABNORMAL LOW (ref 98–111)
Creatinine, Ser: 1.81 mg/dL — ABNORMAL HIGH (ref 0.61–1.24)
GFR, Estimated: 38 mL/min — ABNORMAL LOW (ref >60)
Glucose, Bld: 112 mg/dL — ABNORMAL HIGH (ref 70–99)
Potassium: 3 mmol/L — ABNORMAL LOW (ref 3.5–5.1)
Sodium: 130 mmol/L — ABNORMAL LOW (ref 135–145)

## 2020-06-01 LAB — PROTIME-INR
INR: 2.5 — ABNORMAL HIGH (ref 0.8–1.2)
Prothrombin Time: 26.2 seconds — ABNORMAL HIGH (ref 11.4–15.2)

## 2020-06-01 LAB — GLUCOSE, CAPILLARY
Glucose-Capillary: 113 mg/dL — ABNORMAL HIGH (ref 70–99)
Glucose-Capillary: 149 mg/dL — ABNORMAL HIGH (ref 70–99)
Glucose-Capillary: 129 mg/dL — ABNORMAL HIGH (ref 70–99)
Glucose-Capillary: 179 mg/dL — ABNORMAL HIGH (ref 70–99)

## 2020-06-01 LAB — MAGNESIUM: Magnesium: 2.3 mg/dL (ref 1.7–2.4)

## 2020-06-01 LAB — BRAIN NATRIURETIC PEPTIDE: B Natriuretic Peptide: 275.2 pg/mL — ABNORMAL HIGH (ref 0.0–100.0)

## 2020-06-01 MED ORDER — POTASSIUM CHLORIDE 20 MEQ PO PACK
40.0000 meq | PACK | Freq: Two times a day (BID) | ORAL | Status: AC
  Administered 2020-06-01 – 2020-06-02 (×3): 40 meq via ORAL
  Filled 2020-06-01 (×3): qty 2

## 2020-06-01 MED ORDER — WARFARIN SODIUM 5 MG PO TABS
5.0000 mg | ORAL_TABLET | Freq: Every day | ORAL | Status: DC
  Administered 2020-06-01: 5 mg via ORAL
  Filled 2020-06-01 (×2): qty 1

## 2020-06-01 MED ORDER — ASPIRIN 81 MG PO CHEW
81.0000 mg | CHEWABLE_TABLET | ORAL | Status: AC
  Administered 2020-06-02: 81 mg via ORAL
  Filled 2020-06-01: qty 1

## 2020-06-01 MED ORDER — ATROPINE SULFATE 1 MG/10ML IJ SOSY
PREFILLED_SYRINGE | INTRAMUSCULAR | Status: AC
  Filled 2020-06-01: qty 10

## 2020-06-01 MED ORDER — SODIUM CHLORIDE 0.9% FLUSH
3.0000 mL | Freq: Two times a day (BID) | INTRAVENOUS | Status: DC
  Administered 2020-06-01 (×2): 3 mL via INTRAVENOUS

## 2020-06-01 NOTE — Progress Notes (Signed)
ANTICOAGULATION CONSULT NOTE  Pharmacy Consult for warfarin Indication: atrial fibrillation  No Known Allergies  Patient Measurements: Height: 5\' 9"  (175.3 cm) Weight: 93.5 kg (206 lb 2.1 oz) IBW/kg (Calculated) : 70.7  Vital Signs: Temp: 97.4 F (36.3 C) (01/19 1600) Temp Source: Oral (01/19 1600) BP: 109/59 (01/19 1500) Pulse Rate: 67 (01/19 1500)  Labs: Recent Labs    05/30/20 0455 05/30/20 0849 05/31/20 0500 06/01/20 0511  HGB 9.4*  --  9.4*  --   HCT 32.2*  --  30.4*  --   PLT 167  --  162  --   LABPROT 27.6*  --  27.7* 26.2*  INR 2.7*  --  2.7* 2.5*  CREATININE  --  1.68* 1.62* 1.81*    Estimated Creatinine Clearance: 37.4 mL/min (A) (by C-G formula based on SCr of 1.81 mg/dL (H)).   Assessment: 80 year old male admitted for HF exacerbation. History of afib on warfarin, home dose is 6 mg daily.   INR remains therapeutic at 2.5. last cbc 1/18 stable Hgb 9.4, plt 162. No s/sx of bleeding. Oral intake documented at 75-100%.   Goal of Therapy:  INR 2-3 Monitor platelets by anticoagulation protocol: Yes   Plan:  Warfarin 5 mg PO daily  Daily PT/INR   2/18 Pharm.D. CPP, BCPS Clinical Pharmacist [phone] 06/01/2020 4:07 PM    Please check AMION for all [facility] Pharmacy phone numbers After 10:00 PM, call Main Pharmacy [phone]

## 2020-06-01 NOTE — Progress Notes (Addendum)
Subjective:   Patient states that he feels well this morning. States his breathing feels better than when he was at home. Denies pain anywhere.  PCCM continuing to manage, we will follow and can manage when appropriate  Objective:  Vital signs in last 24 hours: Vitals:   06/01/20 1100 06/01/20 1200 06/01/20 1300 06/01/20 1400  BP: (!) 125/42 (!) 118/56 (!) 117/55 133/90  Pulse: 71 71 65 74  Resp: 15 13 15 14   Temp:  97.9 F (36.6 C)    TempSrc:  Oral    SpO2: 100% 98% 98% 99%  Weight:      Height:        Physical Exam Constitutional: no acute distress, mildly hard of hearing Head: atraumatic ENT: external ears normal Eyes: EOMI Cardiovascular: regular rate and rhythm, normal heart sounds, no pitting edema Pulmonary: effort normal, crackles at lung bases bilaterally, breathing comfortably Abdominal: flat, bowel sounds active Skin: warm and dry, dressing over L arm at site of right heart cath entry Neurological: alert, no focal deficit Psychiatric: normal mood and affect  Assessment/Plan: Isaiah [name] is a 80 y.o. male with hx of HFpEF (EF 55 to 60%), CAD, DM, CVA, CKD 3, HLD, OSA, A. fib on Coumadin presenting with shortness of breath.  Volume up on exam and required BiPAP, now on nasal cannula. Diuresing.  Active Problems:   AKI (acute kidney injury) (HCC)   Acute exacerbation of congestive heart failure (HCC)   Acute on chronic respiratory failure with hypoxia and hypercapnia (HCC)   Acute on chronic hypoxic and hypercapnic respiratory failure Cor pulmonale OSA HFmrEF exacerbation Presented volume up on exam and had pulmonary edema as well as worsening bilateral pleural effusions on chest x-ray.  Last echo on 1/9 with LVEF 45 to 50% and severely elevated pulmonary artery pressure.  Had biventricular dysfunction on prior echo, but more recent shows normal RV function. OSA and likely OHS also contributing.  Initially managed on BiPAP then weaned off, but had a  brief respiratory arrest on 1/9 for which was admitted to the ICU and placed back on BiPAP.  Ultimately did not require intubation.  Transferred out of ICU on 1/19.  BNP down to 275 from 618 on admission, and weight down 45 pounds.   - Heart failure team consulted, appreciate assistance     - Right heart cath today     - diuresis per heart failure team, on spironolactone but no lasix or metoalzone today - PCCM continuing to manage, we will follow and can manage when appropriate - Fluid restriction - Hydralazine 25 mg twice daily - Jardiance 10 mg daily - BiPAP at night - Likely exchange home CPAP for BiPAP on discharge  Goals of care Goals of care were discussed with family on 1/18 and they want full scope of care and full code, but not prolonged life support, tracheostomy, or hemodialysis.  The understanding is a poor prognosis. - Appreciate palliative care consult  AKI on CKD 3 Creatinine was 1.90 on admission, today is 1.65 < 1.81. Baseline around 1.2.  Likely prerenal/cardiorenal, but also in setting of diuresis. - Diuretics as above - Daily BMP - Avoid nephrotoxins  Contraction alkalosis Most recent bicarb of 47, thought due to contraction alkalosis from diuresis for HFmrEF. He is down 32L from admission.  - continue diamox 500mg  daily per CCM - daily bicarb level  Recent acute cholecystitis complicated by sepsis Recent hospitalization for acute cholecystitis and sepsis complicated by brief cardiac arrest.  Initial plan for cardiac clearance and cholecystectomy in 8 weeks, but underwent IR Coley cystotomy for severity of illness on 11/12 and had a brief cardiac arrest during this procedure.   -Need to clarify plan before discharge  DM type II Home regimen of 70/30 50 units twice daily and glipizide 5 mg twice daily.  Currently fairly controlled with SSI. -Continue SSI  HTN -Continue home hydralazine 100 mg twice daily, Imdur 60 mg daily, and Lasix as above  A fib Rate  controlled. -Continue home Coumadin  CAD PAD  had MI in 1994, CABG 1997 with LIMA-LAD and seq SVG-OD. Had PCI to proximal RCA in 2018. Last catheterization in March 2018 with patent SVG-OD, patent LIMA to LAD, 80% stenosis of RCA which was stented. -Continue home pravastatin 40 mg, Imdur 60 mg, warfarin as above -No Unna boots given PAD   Diet: heart healthy IVF: None VTE: Coumadin Prior to Admission Living Arrangement: Home Anticipated Discharge Location: Home Barriers to Discharge: Respiratory failure Dispo: Anticipated discharge in approximately 3-5 day(s).   Remo Lipps, MD 06/01/2020, 2:41 PM Pager: [phone] After 5pm on weekdays and 1pm on weekends: On Call pager [phone]

## 2020-06-01 NOTE — Progress Notes (Addendum)
OT Cancellation Note  Patient Details Name: Isaiah [name] MRN: [id number] DOB: 02-03-1941   Cancelled Treatment:    Reason Eval/Treat Not Completed: Patient not medically ready (bradycardia 38)   Wynona Neat, OTR/L  Acute Rehabilitation Services Pager: [phone] Office: [phone] .  06/01/2020, 3:17 PM

## 2020-06-01 NOTE — H&P (View-Only) (Signed)
Advanced Heart Failure Rounding Note  PCP-Cardiologist: Nanetta Batty, MD    Patient Profile:    80 y/o male w/ CAD, Afib, Stage III CKD and severe chronic hypoxic/hypercarbic respiratory failure due to OSA/OHS with baseline pCO2 ~ 80. Now with severe RV failure and massive volume overload.   Subjective:   Yesterday lasix drip cut back. Negative 2.9 liters. Weight down another 14 pounds?  Overall weight down 45 pounds.   Creatinine trending up 1.6>1.8   Feeling ok. No complaints.     Objective:   Weight Range: 93.5 kg Body mass index is 30.44 kg/m.   Vital Signs:   Temp:  [98 F (36.7 C)-98.6 F (37 C)] 98 F (36.7 C) (01/19 0800) Pulse Rate:  [63-86] 73 (01/19 0700) Resp:  [11-29] 21 (01/19 0700) BP: (100-144)/(37-72) 125/60 (01/19 0700) SpO2:  [92 %-100 %] 99 % (01/19 0700) FiO2 (%):  [30 %-50 %] 50 % (01/18 2102) Weight:  [93.5 kg] 93.5 kg (01/18 1830) Last BM Date: 05/30/20  Weight change: Filed Weights   05/29/20 0500 05/30/20 0500 05/31/20 1830  Weight: 103.4 kg 100.2 kg 93.5 kg    Intake/Output:   Intake/Output Summary (Last 24 hours) at 06/01/2020 0814 Last data filed at 06/01/2020 0700 Gross per 24 hour  Intake 899.39 ml  Output 3600 ml  Net -2700.61 ml      Physical Exam   CVP 9-10  General:   No resp difficulty HEENT: normal Neck: supple. JVP 9-10 . Carotids 2+ bilat; no bruits. No lymphadenopathy or thryomegaly appreciated. Cor: PMI nondisplaced. Irregular rate & rhythm. No rubs, gallops or murmurs. Lungs: Decreased on HFNC Abdomen: soft, nontender, nondistended. No hepatosplenomegaly. No bruits or masses. Good bowel sounds. Extremities: no cyanosis, clubbing, rash, R and LLE ted hose. edema Neuro: alert & orientedx3, cranial nerves grossly intact. moves all 4 extremities w/o difficulty. Affect pleasant   Telemetry    A Fib 60-80s had brief episode of bradycardia in 30s this am.   Labs    CBC Recent Labs    05/30/20 0455  05/31/20 0500  WBC 7.6 7.0  HGB 9.4* 9.4*  HCT 32.2* 30.4*  MCV 102.2* 99.0  PLT 167 162   Basic Metabolic Panel Recent Labs    93/81/82 0500 06/01/20 0511  NA 132* 130*  K 3.3* 3.0*  CL 75* 72*  CO2 43* 47*  GLUCOSE 135* 112*  BUN 40* 44*  CREATININE 1.62* 1.81*  CALCIUM 9.0 9.2  MG 2.0 2.3   Liver Function Tests No results for input(s): AST, ALT, ALKPHOS, BILITOT, PROT, ALBUMIN in the last 72 hours. No results for input(s): LIPASE, AMYLASE in the last 72 hours. Cardiac Enzymes No results for input(s): CKTOTAL, CKMB, CKMBINDEX, TROPONINI in the last 72 hours.  BNP: BNP (last 3 results) Recent Labs    03/22/20 0117 05/20/20 1133 06/01/20 0511  BNP 441.2* 618.9* 275.2*    ProBNP (last 3 results) No results for input(s): PROBNP in the last 8760 hours.   D-Dimer No results for input(s): DDIMER in the last 72 hours. Hemoglobin A1C No results for input(s): HGBA1C in the last 72 hours. Fasting Lipid Panel No results for input(s): CHOL, HDL, LDLCALC, TRIG, CHOLHDL, LDLDIRECT in the last 72 hours. Thyroid Function Tests No results for input(s): TSH, T4TOTAL, T3FREE, THYROIDAB in the last 72 hours.  Invalid input(s): FREET3  Other results:   Imaging    No results found.   Medications:     Scheduled Medications: . acetaZOLAMIDE  500 mg Oral BID  . allopurinol  100 mg Oral BID  . chlorhexidine  15 mL Mouth Rinse BID  . Chlorhexidine Gluconate Cloth  6 each Topical Daily  . empagliflozin  10 mg Oral Daily  . ferrous sulfate  325 mg Oral Q breakfast  . gabapentin  100 mg Oral TID  . hydrALAZINE  25 mg Oral BID  . insulin aspart  0-15 Units Subcutaneous TID WC  . isosorbide mononitrate  30 mg Oral Daily  . mouth rinse  15 mL Mouth Rinse q12n4p  . metolazone  5 mg Oral Daily  . OLANZapine zydis  5 mg Oral QHS  . pantoprazole  40 mg Oral BID AC  . polyethylene glycol  17 g Oral BID  . potassium chloride SA  40 mEq Oral Daily  . pravastatin  40 mg  Oral QHS  . senna  1 tablet Oral Daily  . sodium chloride flush  10-40 mL Intracatheter Q12H  . sodium chloride flush  3 mL Intravenous Q12H  . spironolactone  50 mg Oral Daily  . Warfarin - Pharmacist Dosing Inpatient   Does not apply q1600    Infusions: . sodium chloride    . furosemide (LASIX) 200 mg in dextrose 5% 100 mL (2mg/mL) infusion 10 mg/hr (06/01/20 0700)    PRN Medications: sodium chloride, acetaminophen, bisacodyl, ondansetron (ZOFRAN) IV, sodium chloride flush, sodium chloride flush   Assessment/Plan   1. Acute on chronic heart failure with mid-range EF and severe RV failure:  Echo this admission with EF 45-50%, anteroseptal HK, D-shaped septum with PASP 63 mmHg and dilated IVC.  During 11/21 admission, initial echo showed EF 20% with severe RV dysfunction.  Repeat echo later in hospitalization with EF up to 55% and mild RV dysfunction.  Thought to have stress/Takotsubo cardiomyopathy in setting of septic shock/cholecystitis. Co-ox has been ok. Continues to diurese well - Volume status improved. CVP 9-10. Suspect this is as good as we can get him.  - Stop lasix drip and metolazone.  - Continuing diamox per CCM due to contractile alkalosis.  - Supp potassium. Continue spiro for now. If renal function worse will need to stop spiro.  - no UNNA boots w/ severe PAD. Continue w/ TED hoses  2. Acute on Chronic Hypoxic Respiratory Failure due to end-stage OSA/OHS baseline pCO2 85-95 range - Remains on HFNC. Having difficulty weaning.  - This is truly end-stage. Need to consider tracheostomy but he refuses. - CCM managing 3. Atrial fibrillation: Chronic, rate is controlled. H/o CVA.  - Continue warfarin, INR therapeutic at 2.5 4. CAD: Anterior MI 1994, CABG 1997 with LIMA-LAD and seq SVG-OM/D.  Had PCI to proximal RCA in 2018.  No chest pain. EF has generally been in the 50% range (45-50% on echo this admission). HS-TnI minimally elevated with no trend this admission, ACS is  unlikely.  - Continue home statin.  - He is on warfarin so no ASA.  - No chest pain.  5. PAD: H/o L=>R fem-fem crossover grafting, had to be removed due to infection and had patch angioplasty.  6. AKI on CKD stage 3: Initial creatinine 1.9 but had gone down and now trending up. Suspect he is dry. Stop lasix drip and metolazone.   7. H/o acute cholecystitis: Has cholecystostomy tube from 11/21.  8. Contractile Alkalosis CO2 up to 47 today. On diamox 9.. GOC: prognosis is poor. CCM & palliative care family meeting, continue full support with goal to get him discharged to home.     Length of Stay: 12  Tonye Becket, NP  06/01/2020, 8:14 AM  Advanced Heart Failure Team Pager [phone] (M-F; 7a - 4p)  Please contact [facility] for night-coverage after hours (4p -7a ) and weekends on [email or web address]  Patient seen with NP, agree with the above note.   I/Os negative again with weight down. Remains on HFNC. CVP down to 9-10.  Creatinine mildly higher to 1.8. SBP in 100s-110s this morning.   General: NAD Neck: JVP 9-10 cm, no thyromegaly or thyroid nodule.  Lungs: Clear to auscultation bilaterally with normal respiratory effort. CV: Nondisplaced PMI.  Heart irregular S1/S2, no S3/S4, no murmur.  No peripheral edema.   Abdomen: Soft, nontender, no hepatosplenomegaly, no distention.  Skin: Intact without lesions or rashes.  Neurologic: Alert and oriented x 3.  Psych: Normal affect. Extremities: No clubbing or cyanosis.  HEENT: Normal.   Agree that we may have diuresed him as well as we can for now given RV failure.  Goal CVP probably will need to be around 10.  Creatinine trending up at 1.8.  - Stop Lasix gtt and metolazone.  - Continue acetazolamide for 1 more day.  - Discussed use of sildenafil with CCM.  Suspect group 3 PH so no definite benefit though reasonable to try if he does not have pulmonary venous hypertension.  Would need RHC to determine this.  Discussed RHC either tomorrow morning  or Friday am, discussed risks/benefits and patient agrees to procedure.  Will try to keep INR closer to 2 and will need to use left brachial access.    OHS/OSA continues to play major role in his clinical picture.  Per CCM.   Marca Ancona 06/01/2020 9:02 AM

## 2020-06-01 NOTE — Progress Notes (Signed)
Subjective: Patient was seen and evaluated at bedside this+ morning rounds. He tries to answer my question but hard to communicate being on BiPAP. No complaint now but when asking about chest pain, he endorses that he had some chest discomfort yesterday that resolved.   Objective:  Vital signs in last 24 hours: Vitals:   06/01/20 0400 06/01/20 0500 06/01/20 0600 06/01/20 0700  BP: (!) 111/51 (!) 111/50 (!) 124/37 125/60  Pulse: 66 66 68 73  Resp: (!) 21 18 18  (!) 21  Temp: 98.6 F (37 C)     TempSrc: Axillary     SpO2: 99% 97% 97% 99%  Weight:      Height:       Constitutional: Patient is on BiPAP. No acute distress.  HENT:  Head: Normocephalic and atraumatic.  Cardiovascular: RRR, nl S1S2, no murmur, has stocking, no LEE Respiratory: On BiPAP. Effort normal and breath sounds normal. Ant chest exam without rales or wheezing. Some decreased breath sound on lateral chest exam at bases. Not able to auscultate posterior chest being on BiPAP and patient not able to sit up now.  GI: Soft. Drain is in the place. There is no tenderness.  Neurological: Is alert and oriented x 3 Skin: Not diaphoretic. No erythema.  Psychiatric: Normal mood and affect.   Assessment/Plan:  Active Problems:   AKI (acute kidney injury) (HCC)   Acute exacerbation of congestive heart failure (HCC)   Acute on chronic respiratory failure with hypoxia and hypercapnia (HCC)  80 year old male with past medical history of HFpEF, II DM, CVA, A-fib on coumadin, HLD, HTN, CAD, CKD stage III  OSA/OHS on 2-3 li O2 at home, presented with shortness of breath and volume overload, admitted on 1/7 for CHF exacerbation. His hospitalization was complicated with near respiratory arrest and patient was transferred to ICU. He was on BiPAP>HFNC.   Acute on chronic hypoxic and hypercapnic respiratory failure: Acute on chronic HF with mid-range EF and sever RV failure OSA/OHS not compliant to C-PAP at home:  He had a brief  near respiratory arrest on 1/9  He was admitted in ICU and was placed on BiPAP. (Did not require intubation). He meanwhile had 2 episodes of hypoxia that managed with NIPPV and diuresis. His HF exacerbation managed by heart failure team. Echo 1/9 showed Echocardiogram 1/9: LV EF 45 to 50% with ant. septal hypokinesis, and decreased LV function.   (Of note: On prior hospitalization (11/1-11/23/2021) and per echo 11/3, he found to have severe biventricular dysfunction (Severe RV failure and EF 20%)> that improved on last echo 1/9 to nl RV function and EF 45-50%. (Sepsis or Takotsubo CMP)  He has been diuresed with lasix gtt, Metolazone and spironolactone and acetazolamide. Lower extremity edema improved. Has JVD up to upper neck. BNP down to 275 (was 618 twelve days ago). He was on HFNC 30>20 and BiPAP at night.    Intake/Output Summary (Last 24 hours) at 06/01/2020 1353 Last data filed at 06/01/2020 0900 Gross per 24 hour  Intake 286.18 ml  Output 3120 ml  Net -2833.82 ml    -Patient is on Lasix gtt, Metolazone, spironolactone, acetazolamide.>Per HF team, stop Lasix gtt and metolazone today -Has lost 45 lb since admission, with net -2.5 li yesterday. -Cr slightly up at 1.8 (from 1.6). If Cr continues to increase, stop Spironolactone per HF team -Hypokalemia at 3 today. He is getting 40 meq Kcl x 2  today -Mg normal -Is scheduled for Rt heart cath for tomorrow -Appreciate  Heart failure team recommendation and management -Strict I&Os -Weight daily -Fluid restriction -On hydralazine 25 mg BID -On Jaurdiance 10 mg QD  GOC:  Per PCCM note from 1/18, GOC has been discussed with family who want full scope of care and full code but not prolonged life support, tracheostomy, or hemodialysis.  -Appreciate palliative care follow up. Unfortunately patient has a poor prognosis  AKI on CKD 3: Initial Cr 1.9 Some Cr elevation today compare to yesterday (1.8 from 1.6). Likely in setting of  diuresis.  -Lasix and metolazone will be stopped today -If more worsening of kidney function, consider holdiding spironolactone Suspect he is dry. Stop lasix drip and metolazone.   -BMP daily  Recent acute cholecystitis with sepsis: S/p percutaneous drain placed on 11/3 (during prior admission).   Patient had a recent hospitalization for acute cholecystitis and sepsis that was complicated with brief cardiac arrest during IR procedure 11/2. Drain was successfully placed next day on 11/3/  Initial plan was cardiac clearance and then proceeding to cholecystectomy in 8 weeks. Given severity of his illness, he underwent percutaneous cholecystostomy by IR on 03/16/2020 and Abx therapy. He was supposed to follow up with IR out patient. Should clarify the plan before discharge and after being more stable.  Afib: No RVR Appears to be sinus now? -Continue Warfarin  CAD: PAD Anterior MI 1994, CABG 1997 with LIMA-LAD and seq SVG-OM/D. Had PCI to proximal RCA in 2018. No chest pain now but he is giving unclear Hx about some chest discomfort yesterday. Will get more Hx when off of BiPAP.  -Continue Pravastatin -On IMDUR 30 mg QD -No ASA, being on Warfarin -No UNNA boot per cards given PAD.  Diet: HHCM w fluid restriction, NPO after midnight  IV fluid: None VTE ppx: Warfarin Code status: Full   Chevis Pretty, MD 06/01/2020, 7:36 AM Pager: [phone] After 5pm on weekdays and 1pm on weekends: On Call pager [phone]

## 2020-06-01 NOTE — Progress Notes (Addendum)
Advanced Heart Failure Rounding Note  PCP-Cardiologist: Nanetta Batty, MD    Patient Profile:    80 y/o male w/ CAD, Afib, Stage III CKD and severe chronic hypoxic/hypercarbic respiratory failure due to OSA/OHS with baseline pCO2 ~ 80. Now with severe RV failure and massive volume overload.   Subjective:   Yesterday lasix drip cut back. Negative 2.9 liters. Weight down another 14 pounds?  Overall weight down 45 pounds.   Creatinine trending up 1.6>1.8   Feeling ok. No complaints.     Objective:   Weight Range: 93.5 kg Body mass index is 30.44 kg/m.   Vital Signs:   Temp:  [98 F (36.7 C)-98.6 F (37 C)] 98 F (36.7 C) (01/19 0800) Pulse Rate:  [63-86] 73 (01/19 0700) Resp:  [11-29] 21 (01/19 0700) BP: (100-144)/(37-72) 125/60 (01/19 0700) SpO2:  [92 %-100 %] 99 % (01/19 0700) FiO2 (%):  [30 %-50 %] 50 % (01/18 2102) Weight:  [93.5 kg] 93.5 kg (01/18 1830) Last BM Date: 05/30/20  Weight change: Filed Weights   05/29/20 0500 05/30/20 0500 05/31/20 1830  Weight: 103.4 kg 100.2 kg 93.5 kg    Intake/Output:   Intake/Output Summary (Last 24 hours) at 06/01/2020 0814 Last data filed at 06/01/2020 0700 Gross per 24 hour  Intake 899.39 ml  Output 3600 ml  Net -2700.61 ml      Physical Exam   CVP 9-10  General:   No resp difficulty HEENT: normal Neck: supple. JVP 9-10 . Carotids 2+ bilat; no bruits. No lymphadenopathy or thryomegaly appreciated. Cor: PMI nondisplaced. Irregular rate & rhythm. No rubs, gallops or murmurs. Lungs: Decreased on HFNC Abdomen: soft, nontender, nondistended. No hepatosplenomegaly. No bruits or masses. Good bowel sounds. Extremities: no cyanosis, clubbing, rash, R and LLE ted hose. edema Neuro: alert & orientedx3, cranial nerves grossly intact. moves all 4 extremities w/o difficulty. Affect pleasant   Telemetry    A Fib 60-80s had brief episode of bradycardia in 30s this am.   Labs    CBC Recent Labs    05/30/20 0455  05/31/20 0500  WBC 7.6 7.0  HGB 9.4* 9.4*  HCT 32.2* 30.4*  MCV 102.2* 99.0  PLT 167 162   Basic Metabolic Panel Recent Labs    93/81/82 0500 06/01/20 0511  NA 132* 130*  K 3.3* 3.0*  CL 75* 72*  CO2 43* 47*  GLUCOSE 135* 112*  BUN 40* 44*  CREATININE 1.62* 1.81*  CALCIUM 9.0 9.2  MG 2.0 2.3   Liver Function Tests No results for input(s): AST, ALT, ALKPHOS, BILITOT, PROT, ALBUMIN in the last 72 hours. No results for input(s): LIPASE, AMYLASE in the last 72 hours. Cardiac Enzymes No results for input(s): CKTOTAL, CKMB, CKMBINDEX, TROPONINI in the last 72 hours.  BNP: BNP (last 3 results) Recent Labs    03/22/20 0117 05/20/20 1133 06/01/20 0511  BNP 441.2* 618.9* 275.2*    ProBNP (last 3 results) No results for input(s): PROBNP in the last 8760 hours.   D-Dimer No results for input(s): DDIMER in the last 72 hours. Hemoglobin A1C No results for input(s): HGBA1C in the last 72 hours. Fasting Lipid Panel No results for input(s): CHOL, HDL, LDLCALC, TRIG, CHOLHDL, LDLDIRECT in the last 72 hours. Thyroid Function Tests No results for input(s): TSH, T4TOTAL, T3FREE, THYROIDAB in the last 72 hours.  Invalid input(s): FREET3  Other results:   Imaging    No results found.   Medications:     Scheduled Medications: . acetaZOLAMIDE  500 mg Oral BID  . allopurinol  100 mg Oral BID  . chlorhexidine  15 mL Mouth Rinse BID  . Chlorhexidine Gluconate Cloth  6 each Topical Daily  . empagliflozin  10 mg Oral Daily  . ferrous sulfate  325 mg Oral Q breakfast  . gabapentin  100 mg Oral TID  . hydrALAZINE  25 mg Oral BID  . insulin aspart  0-15 Units Subcutaneous TID WC  . isosorbide mononitrate  30 mg Oral Daily  . mouth rinse  15 mL Mouth Rinse q12n4p  . metolazone  5 mg Oral Daily  . OLANZapine zydis  5 mg Oral QHS  . pantoprazole  40 mg Oral BID AC  . polyethylene glycol  17 g Oral BID  . potassium chloride SA  40 mEq Oral Daily  . pravastatin  40 mg  Oral QHS  . senna  1 tablet Oral Daily  . sodium chloride flush  10-40 mL Intracatheter Q12H  . sodium chloride flush  3 mL Intravenous Q12H  . spironolactone  50 mg Oral Daily  . Warfarin - Pharmacist Dosing Inpatient   Does not apply q1600    Infusions: . sodium chloride    . furosemide (LASIX) 200 mg in dextrose 5% 100 mL (2mg /mL) infusion 10 mg/hr (06/01/20 0700)    PRN Medications: sodium chloride, acetaminophen, bisacodyl, ondansetron (ZOFRAN) IV, sodium chloride flush, sodium chloride flush   Assessment/Plan   1. Acute on chronic heart failure with mid-range EF and severe RV failure:  Echo this admission with EF 45-50%, anteroseptal HK, D-shaped septum with PASP 63 mmHg and dilated IVC.  During 11/21 admission, initial echo showed EF 20% with severe RV dysfunction.  Repeat echo later in hospitalization with EF up to 55% and mild RV dysfunction.  Thought to have stress/Takotsubo cardiomyopathy in setting of septic shock/cholecystitis. Co-ox has been ok. Continues to diurese well - Volume status improved. CVP 9-10. Suspect this is as good as we can get him.  - Stop lasix drip and metolazone.  - Continuing diamox per CCM due to contractile alkalosis.  - Supp potassium. Continue spiro for now. If renal function worse will need to stop spiro.  - no UNNA boots w/ severe PAD. Continue w/ TED hoses  2. Acute on Chronic Hypoxic Respiratory Failure due to end-stage OSA/OHS baseline pCO2 85-95 range - Remains on HFNC. Having difficulty weaning.  - This is truly end-stage. Need to consider tracheostomy but he refuses. - CCM managing 3. Atrial fibrillation: Chronic, rate is controlled. H/o CVA.  - Continue warfarin, INR therapeutic at 2.5 4. CAD: Anterior MI 1994, CABG 1997 with LIMA-LAD and seq SVG-OM/D.  Had PCI to proximal RCA in 2018.  No chest pain. EF has generally been in the 50% range (45-50% on echo this admission). HS-TnI minimally elevated with no trend this admission, ACS is  unlikely.  - Continue home statin.  - He is on warfarin so no ASA.  - No chest pain.  5. PAD: H/o L=>R fem-fem crossover grafting, had to be removed due to infection and had patch angioplasty.  6. AKI on CKD stage 3: Initial creatinine 1.9 but had gone down and now trending up. Suspect he is dry. Stop lasix drip and metolazone.   7. H/o acute cholecystitis: Has cholecystostomy tube from 11/21.  8. Contractile Alkalosis CO2 up to 47 today. On diamox 9.. GOC: prognosis is poor. CCM & palliative care family meeting, continue full support with goal to get him discharged to home.  Length of Stay: 12  Tonye Becket, NP  06/01/2020, 8:14 AM  Advanced Heart Failure Team Pager [phone] (M-F; 7a - 4p)  Please contact [facility] for night-coverage after hours (4p -7a ) and weekends on [email or web address]  Patient seen with NP, agree with the above note.   I/Os negative again with weight down. Remains on HFNC. CVP down to 9-10.  Creatinine mildly higher to 1.8. SBP in 100s-110s this morning.   General: NAD Neck: JVP 9-10 cm, no thyromegaly or thyroid nodule.  Lungs: Clear to auscultation bilaterally with normal respiratory effort. CV: Nondisplaced PMI.  Heart irregular S1/S2, no S3/S4, no murmur.  No peripheral edema.   Abdomen: Soft, nontender, no hepatosplenomegaly, no distention.  Skin: Intact without lesions or rashes.  Neurologic: Alert and oriented x 3.  Psych: Normal affect. Extremities: No clubbing or cyanosis.  HEENT: Normal.   Agree that we may have diuresed him as well as we can for now given RV failure.  Goal CVP probably will need to be around 10.  Creatinine trending up at 1.8.  - Stop Lasix gtt and metolazone.  - Continue acetazolamide for 1 more day.  - Discussed use of sildenafil with CCM.  Suspect group 3 PH so no definite benefit though reasonable to try if he does not have pulmonary venous hypertension.  Would need RHC to determine this.  Discussed RHC either tomorrow morning  or Friday am, discussed risks/benefits and patient agrees to procedure.  Will try to keep INR closer to 2 and will need to use left brachial access.    OHS/OSA continues to play major role in his clinical picture.  Per CCM.   Marca Ancona 06/01/2020 9:02 AM

## 2020-06-01 NOTE — Progress Notes (Incomplete)
   Subjective: ***  Objective:  Vital signs in last 24 hours: Vitals:   06/01/20 0400 06/01/20 0500 06/01/20 0600 06/01/20 0700  BP: (!) 111/51 (!) 111/50 (!) 124/37 125/60  Pulse: 66 66 68 73  Resp: (!) 21 18 18  (!) 21  Temp: 98.6 F (37 C)     TempSrc: Axillary     SpO2: 99% 97% 97% 99%  Weight:      Height:       ***  Assessment/Plan:  Active Problems:   AKI (acute kidney injury) (HCC)   Acute exacerbation of congestive heart failure (HCC)   Acute on chronic respiratory failure with hypoxia and hypercapnia (HCC)  *** 80 year old male with past medical history of HFpEF, II DM, CVA, A-fib on coumadin, HLD, HTN, CAD, CKD stage III  OSA/OHS on 2-3 li O2 at home, presented with shortness of breath and volume overload, admitted on 1/7 for CHF exacerbation. His hospitalization was complicated with near respiratory arrest and patient was transferred to ICU. He was on BiPAP>HFNC.   Acute on chronic hypoxic and hypercapnic respiratory failure: Acute on chronic HF with mid-range EF and sever RV failure OSA/OHS not compliant to C-PAP at home:  He had a brief near respiratory arrest on 1/9  He was admitted in ICU and was placed on BiPAP. (Did not require intubation). He meanwhile had 2 episodes of hypoxia that managed with NIPPV and diuresis. His HF exacerbation managed by heart failure team. Echo 1/9 showed Echocardiogram 1/9: LV EF 45 to 50% with ant. septal hypokinesis, and decreased LV function.   (Of note: On prior hospitalization (11/1-11/23/2021) and per echo 11/3, he found to have severe biventricular dysfunction (Severe RV failure and EF 20%)> that improved on last echo 1/9 to nl RV function and EF 45-50%. (Sepsis or Takotsubo CMP)  Patient has been on Lasix gtt, acetazolamide, metolazone and spironolactone. He has lost 30 lb this admission by diuresis as above. Cr stable at 1.6. He was on HFNC 30>20 and BiPAP at night. And now on BiPAP again.***  -Patient is on Lasix  gtt***, Metolazone ***, spironolactone***, acetazolamide -Has lost *** lb since admission, **lb since yesterday with net -*** -Cr stable at *** -CoOX*** - - - -Appreciate Heart failure team recommendation and management -Strict I&Os -Weight daily -fluid restriction -***   GOC: Per PCCM note from 1/18, GOC has been discussed with family who want full scope of care and full code but not prolonged life support, tracheostomy, or hemodialysis.  -Appreciate palliative care follow up   Recent acute cholecystitis with sepsis: S/p percutaneous drain placed on 11/3 (during prior admission).   Patient had a recent hospitalization for acute cholecystitis and sepsis that was complicated with brief cardiac arrest during IR procedure 11/2. Drain was successfully placed next day on 11/3/  Initial plan was cardiac clearance and then proceeding to cholecystectomy in 8 weeks. Given severity of his illness, he underwent percutaneous cholecystostomy by IR on 03/16/2020 and Abx therapy. He was supposed to follow up with IR out patient***.    Prior to Admission Living Arrangement: Anticipated Discharge Location: Barriers to Discharge: Dispo: Anticipated discharge in approximately *** day(s).   13/07/2019, MD 06/01/2020, 7:36 AM Pager: [phone] After 5pm on weekdays and 1pm on weekends: On Call pager [phone]

## 2020-06-02 ENCOUNTER — Inpatient Hospital Stay (HOSPITAL_COMMUNITY): Payer: Medicare Other

## 2020-06-02 ENCOUNTER — Encounter (HOSPITAL_COMMUNITY)
Admission: EM | Disposition: A | Discharge status: Home Health | Payer: Self-pay | Source: Home / Self Care | Attending: Internal Medicine

## 2020-06-02 ENCOUNTER — Telehealth (HOSPITAL_COMMUNITY): Payer: Self-pay | Admitting: Pharmacist

## 2020-06-02 ENCOUNTER — Encounter (HOSPITAL_COMMUNITY): Payer: Self-pay | Admitting: Cardiology

## 2020-06-02 DIAGNOSIS — J9601 Acute respiratory failure with hypoxia: Secondary | ICD-10-CM | POA: Diagnosis not present

## 2020-06-02 DIAGNOSIS — J9621 Acute and chronic respiratory failure with hypoxia: Secondary | ICD-10-CM | POA: Diagnosis not present

## 2020-06-02 DIAGNOSIS — I509 Heart failure, unspecified: Secondary | ICD-10-CM | POA: Diagnosis not present

## 2020-06-02 DIAGNOSIS — J9622 Acute and chronic respiratory failure with hypercapnia: Secondary | ICD-10-CM | POA: Diagnosis not present

## 2020-06-02 DIAGNOSIS — I5033 Acute on chronic diastolic (congestive) heart failure: Secondary | ICD-10-CM | POA: Diagnosis not present

## 2020-06-02 HISTORY — PX: RIGHT HEART CATH: CATH118263

## 2020-06-02 LAB — POCT I-STAT EG7
Acid-Base Excess: 20 mmol/L — ABNORMAL HIGH (ref 0.0–2.0)
Acid-Base Excess: 20 mmol/L — ABNORMAL HIGH (ref 0.0–2.0)
Bicarbonate: 48.5 mmol/L — ABNORMAL HIGH (ref 20.0–28.0)
Bicarbonate: 48 mmol/L — ABNORMAL HIGH (ref 20.0–28.0)
Calcium, Ion: 1.13 mmol/L — ABNORMAL LOW (ref 1.15–1.40)
Calcium, Ion: 1.13 mmol/L — ABNORMAL LOW (ref 1.15–1.40)
HCT: 35 % — ABNORMAL LOW (ref 39.0–52.0)
HCT: 34 % — ABNORMAL LOW (ref 39.0–52.0)
Hemoglobin: 11.9 g/dL — ABNORMAL LOW (ref 13.0–17.0)
Hemoglobin: 11.6 g/dL — ABNORMAL LOW (ref 13.0–17.0)
O2 Saturation: 70 %
O2 Saturation: 70 %
Potassium: 3.3 mmol/L — ABNORMAL LOW (ref 3.5–5.1)
Potassium: 3.3 mmol/L — ABNORMAL LOW (ref 3.5–5.1)
Sodium: 128 mmol/L — ABNORMAL LOW (ref 135–145)
Sodium: 129 mmol/L — ABNORMAL LOW (ref 135–145)
TCO2: >50 mmol/L — ABNORMAL HIGH (ref 22–32)
TCO2: >50 mmol/L — ABNORMAL HIGH (ref 22–32)
pCO2, Ven: 73.1 mmHg (ref 44.0–60.0)
pCO2, Ven: 73.1 mmHg (ref 44.0–60.0)
pH, Ven: 7.43 (ref 7.250–7.430)
pH, Ven: 7.425 (ref 7.250–7.430)
pO2, Ven: 38 mmHg (ref 32.0–45.0)
pO2, Ven: 38 mmHg (ref 32.0–45.0)

## 2020-06-02 LAB — BASIC METABOLIC PANEL
Anion gap: 13 (ref 5–15)
BUN: 42 mg/dL — ABNORMAL HIGH (ref 8–23)
CO2: 40 mmol/L — ABNORMAL HIGH (ref 22–32)
Calcium: 8.9 mg/dL (ref 8.9–10.3)
Chloride: 77 mmol/L — ABNORMAL LOW (ref 98–111)
Creatinine, Ser: 1.65 mg/dL — ABNORMAL HIGH (ref 0.61–1.24)
GFR, Estimated: 42 mL/min — ABNORMAL LOW (ref >60)
Glucose, Bld: 155 mg/dL — ABNORMAL HIGH (ref 70–99)
Potassium: 3.9 mmol/L (ref 3.5–5.1)
Sodium: 130 mmol/L — ABNORMAL LOW (ref 135–145)

## 2020-06-02 LAB — GLUCOSE, CAPILLARY
Glucose-Capillary: 204 mg/dL — ABNORMAL HIGH (ref 70–99)
Glucose-Capillary: 168 mg/dL — ABNORMAL HIGH (ref 70–99)

## 2020-06-02 LAB — PROTIME-INR
INR: 2.2 — ABNORMAL HIGH (ref 0.8–1.2)
Prothrombin Time: 23.6 seconds — ABNORMAL HIGH (ref 11.4–15.2)

## 2020-06-02 SURGERY — RIGHT HEART CATH
Anesthesia: LOCAL

## 2020-06-02 MED ORDER — POTASSIUM CHLORIDE CRYS ER 20 MEQ PO TBCR
40.0000 meq | EXTENDED_RELEASE_TABLET | Freq: Once | ORAL | Status: AC
  Administered 2020-06-02: 40 meq via ORAL
  Filled 2020-06-02: qty 2

## 2020-06-02 MED ORDER — TORSEMIDE 20 MG PO TABS
40.0000 mg | ORAL_TABLET | Freq: Every day | ORAL | Status: DC
  Administered 2020-06-02 – 2020-06-04 (×3): 40 mg via ORAL
  Filled 2020-06-02 (×3): qty 2

## 2020-06-02 MED ORDER — LIDOCAINE HCL (PF) 1 % IJ SOLN
INTRAMUSCULAR | Status: AC
  Filled 2020-06-02: qty 30

## 2020-06-02 MED ORDER — SODIUM CHLORIDE 0.9% FLUSH: 3.0000 mL | INTRAVENOUS | Status: DC | PRN

## 2020-06-02 MED ORDER — SODIUM CHLORIDE 0.9 % IV SOLN: 250.0000 mL | INTRAVENOUS | Status: DC | PRN

## 2020-06-02 MED ORDER — SODIUM CHLORIDE 0.9 % IV SOLN: INTRAVENOUS | Status: DC

## 2020-06-02 MED ORDER — SILDENAFIL CITRATE 20 MG PO TABS
20.0000 mg | ORAL_TABLET | Freq: Three times a day (TID) | ORAL | Status: DC
  Administered 2020-06-02 – 2020-06-10 (×24): 20 mg via ORAL
  Filled 2020-06-02 (×25): qty 1

## 2020-06-02 MED ORDER — HEPARIN (PORCINE) IN NACL 1000-0.9 UT/500ML-% IV SOLN
INTRAVENOUS | Status: AC
  Filled 2020-06-02: qty 500

## 2020-06-02 MED ORDER — WARFARIN SODIUM 7.5 MG PO TABS
7.5000 mg | ORAL_TABLET | Freq: Once | ORAL | Status: AC
  Administered 2020-06-02: 7.5 mg via ORAL
  Filled 2020-06-02: qty 1

## 2020-06-02 MED ORDER — ACETAZOLAMIDE 250 MG PO TABS
500.0000 mg | ORAL_TABLET | Freq: Every day | ORAL | Status: DC
  Administered 2020-06-02 – 2020-06-04 (×3): 500 mg via ORAL
  Filled 2020-06-02 (×3): qty 2

## 2020-06-02 MED ORDER — LIDOCAINE HCL (PF) 1 % IJ SOLN
INTRAMUSCULAR | Status: DC | PRN
  Administered 2020-06-02: 2 mL via INTRADERMAL

## 2020-06-02 MED ORDER — HEPARIN (PORCINE) IN NACL 1000-0.9 UT/500ML-% IV SOLN
INTRAVENOUS | Status: DC | PRN
  Administered 2020-06-02: 500 mL

## 2020-06-02 SURGICAL SUPPLY — 4 items
CATH BALLN WEDGE 5F 110CM (CATHETERS) ×1 IMPLANT
PACK CARDIAC CATHETERIZATION (CUSTOM PROCEDURE TRAY) ×2 IMPLANT
SHEATH GLIDE SLENDER 4/5FR (SHEATH) ×1 IMPLANT
TRANSDUCER W/STOPCOCK (MISCELLANEOUS) ×2 IMPLANT

## 2020-06-02 NOTE — Progress Notes (Signed)
OT Cancellation Note  Patient Details Name: Isaiah [name] MRN: [id number] DOB: 15-Mar-1941   Cancelled Treatment:    Reason Eval/Treat Not Completed: Patient at procedure or test/ unavailable - at cath lab, OT to check back later date.  Ignacia Palma, OTR/L Acute Rehab Services Pager [phone] Office [phone]     Evette Georges 06/02/2020, 8:52 AM

## 2020-06-02 NOTE — Progress Notes (Signed)
NAME:  CHANEL MCKESSON, MRN:  [id number], DOB:  09-27-1940, LOS: 13 ADMISSION DATE:  05/20/2020, CONSULTATION DATE:  05/22/2020 REFERRING MD:  Dr. Antony Contras, CHIEF COMPLAINT:  Respiratory arrest    Brief History:  80 year old male presented with progressive shortness of breath and hypoxia with bilateral lower extremity swelling on admission.  Admitted per teaching service for acute CHF exasperation and volume overload.  Morning of 05/22/2020 patient suffered brief respiratory arrest for which PCCM was consulted for further management  Past Medical History:  Diastolic congestive heart failure, type 2 diabetes, prior CVA, atrial fibrillation on chronic anticoagulation, obstructive sleep apnea, hyperlipidemia, hypertension, CAD, and CKD stage III   Significant Hospital Events:  Admitted 1/7 1/9 critical care consulted for near respiratory arrest placed on BiPAP responded to noninvasive support and diuretics  1/10: Improving oxygen requirements improving 1/11: Has had 2 more isolated episodes of hypoxia with saturations as low as the 40s typically happening after positional change responding well to positive pressure via noninvasive mask Lasix dosing tripled added spironolactone, heart failure team evaluated and started Lasix drip 1/11: Making progress from a fluid volume standpoint but fatigued, still requiring BiPAP.  Overall clinically actually looks worse.  Started on Lasix drip after advanced heart failure team evaluation 1/12: Required BiPAP most of the day 1/13: Now down to 10 L from fluid volume standpoint after adjusting diuretics PICC line placed.  Goals of care discussion had with the patient, his daughter and son.  They still desire full aggressive scope of care including intubation, ventilation, and CPR should the situation arise.  They do however acknowledge that the patient would not want prolonged mechanical ventilation, also would not be interested in dialysis.  1/16: Net negative 18L since  admission with aggressive diuresis. Remains volume overloaded with no change in FIO2 requirement of 50-60%  Consults:    Procedures:  Echocardiogram 1/9: Anterior septal hypokinesis with LV EF now 45 to 50% with decreased LV function.  Unable to determine diastolic function.  But there was interventricular septal flattening in systole and diastole consistent with right ventricular volume overload.  There is severely elevated pulmonary artery systolic pressure.  Significant Diagnostic Tests:  CXR 1/7 >Worsening CHF with bilateral effusions left greater than right.  Micro Data:  COVID 1/7 > Negative  RVP 1/7 > negative  Antimicrobials:     Interim History / Subjective:  Continues to need BiPAP at night. Clinically no complaints with patient starting to ambulate to chair.   Objective   Blood pressure (!) 131/54, pulse 75, temperature 97.7 F (36.5 C), temperature source Oral, resp. rate 14, height 5\' 9"  (1.753 m), weight 93.5 kg, SpO2 96 %. CVP:  [1 mmHg-16 mmHg] 14 mmHg  FiO2 (%):  [50 %] 50 %   Intake/Output Summary (Last 24 hours) at 06/02/2020 1309 Last data filed at 06/02/2020 1100 Gross per 24 hour  Intake 780 ml  Output 3150 ml  Net -2370 ml  CVP:  [1 mmHg-16 mmHg] 14 mmHg Filed Weights   05/29/20 0500 05/30/20 0500 05/31/20 1830  Weight: 103.4 kg 100.2 kg 93.5 kg   Physical Exam: General: Obese, chronically ill-appearing, no acute distress HENT: [place], AT, OP clear, MMM Eyes: EOMI, no scleral icterus Respiratory:  Crackles at both bases.  Cardiovascular: RRR, No signs of RV strain. JVP at 4cm.  GI: BS+, soft, nontender, percutaneous drain in place with minimal bilious output Extremities: edema now much better.  Neuro: AAO x4, CNII-XII grossly intact  Resolved Hospital Problem list  Brief respiratory arrest 1/9  Assessment & Plan:  Acute respiratory failure with hypoxemia and hypercapnia secondary to cor pulmonale and OSA/OHS and decompensated HFpEF - Continue  nocturnal BIPAP - Wean FiO2 as tolerated - Incentive spirometry and mobilization for atelectasis component.   Acute decompensated HFpEF - Edema much improved, creatinine rising and alkalosis not improved with acetazolamide.  - Furosemide infusion stopped with plan to convert to intermittent dosing/maintenance dose in next few days. BNP to help guide therapy.    Severe pulmonary hypertension - For right heart cath tomorrow to assess PA pressures prior to trial of sildenafil  Acute on chronic kidney failure stage IIIa, stable Cr 1.45-1.55 --Monitor UOP/Cr --Foley --Strict I&O --Renal dose meds  Acute toxic metabolic encephalopathy secondary to hypercarbia, medications. Improved --Reduced zyprexa dosing 1/13 --Decrease gabapentin 1/15 --Minimize sedating meds --BiPAP nightly  Chronic atrial fibrillation --Home medications include Coumadin --Telemetry  Type 2 diabetes --SSI  Prior acute cholecystitis status post percutaneous drain placed on 11/3 --Cont trend output  --Consider surgery consult for definitive management when stable  Best practice (evaluated daily)  Diet: Heart healthy Pain/Anxiety/Delirium protocol (if indicated): As needed  VAP protocol (if indicated): N/A DVT prophylaxis: Coumadin  GI prophylaxis: PPI Glucose control: SSI, changed to AC at bedtime 1/10 Mobility: Progressive ambulation.  Disposition: Continue ICU care until oxygenation improves.   Goals of Care:  Last date of multidisciplinary goals of care discussion: This was completed on 1/13, this included nurse practitioner and bedside nursing staff in addition to family listed below Family and staff present: [name], his daughter and Samarth Junior his son in addition to the patient himself who was able to participate Summary of discussion: The patient, and his family continue to desire full scope of medical care this would include CPR should he suffer a cardiac arrest.  They do however concede  that he would not want prolonged life support, tracheostomy, or hemodialysis. Follow up goals of care discussion due: 1/20 Code Status: Full   CRITICAL CARE Performed by: Lynnell Catalan   Total critical care time: 40 minutes  Critical care time was exclusive of separately billable procedures and treating other patients.  Critical care was necessary to treat or prevent imminent or life-threatening deterioration.  Critical care was time spent personally by me on the following activities: development of treatment plan with patient and/or surrogate as well as nursing, discussions with consultants, evaluation of patient's response to treatment, examination of patient, obtaining history from patient or surrogate, ordering and performing treatments and interventions, ordering and review of laboratory studies, ordering and review of radiographic studies, pulse oximetry, re-evaluation of patient's condition and participation in multidisciplinary rounds.  Lynnell Catalan, MD [facility] ICU Physician [facility] [place] [facility]  Pager: [phone] Mobile: [phone] After hours: [phone]. ,

## 2020-06-02 NOTE — Progress Notes (Signed)
NAME:  Isaiah [name], MRN:  [id number], DOB:  [birth year], LOS: 13 ADMISSION DATE:  05/20/2020, CONSULTATION DATE:  05/22/2020 REFERRING MD:  Dr. Antony Contras, CHIEF COMPLAINT:  Respiratory arrest    Brief History:  80 year old male presented with progressive shortness of breath and hypoxia with bilateral lower extremity swelling on admission.  Admitted per teaching service for acute CHF exasperation and volume overload.  Morning of 05/22/2020 patient suffered brief respiratory arrest for which PCCM was consulted for further management  Past Medical History:  Diastolic congestive heart failure, type 2 diabetes, prior CVA, atrial fibrillation on chronic anticoagulation, obstructive sleep apnea, hyperlipidemia, hypertension, CAD, and CKD stage III   Significant Hospital Events:  Admitted 1/7 1/9 critical care consulted for near respiratory arrest placed on BiPAP responded to noninvasive support and diuretics  1/10: Improving oxygen requirements improving 1/11: Has had 2 more isolated episodes of hypoxia with saturations as low as the 40s typically happening after positional change responding well to positive pressure via noninvasive mask Lasix dosing tripled added spironolactone, heart failure team evaluated and started Lasix drip 1/11: Making progress from a fluid volume standpoint but fatigued, still requiring BiPAP.  Overall clinically actually looks worse.  Started on Lasix drip after advanced heart failure team evaluation 1/12: Required BiPAP most of the day 1/13: Now down to 10 L from fluid volume standpoint after adjusting diuretics PICC line placed.  Goals of care discussion had with the patient, his daughter and son.  They still desire full aggressive scope of care including intubation, ventilation, and CPR should the situation arise.  They do however acknowledge that the patient would not want prolonged mechanical ventilation, also would not be interested in dialysis.  1/16: Net negative 18L since  admission with aggressive diuresis. Remains volume overloaded with no change in FIO2 requirement of 50-60%  Consults:  Advanced HF  Procedures:  Echocardiogram 1/9: Anterior septal hypokinesis with LV EF now 45 to 50% with decreased LV function.  Unable to determine diastolic function.  But there was interventricular septal flattening in systole and diastole consistent with right ventricular volume overload.  There is severely elevated pulmonary artery systolic pressure.  Significant Diagnostic Tests:  CXR 1/7 >Worsening CHF with bilateral effusions left greater than right. Right heart cath > moderately elevated PA with PA 59/22, PCWP 16 with TPG 6, PVR 2.6 WU  Micro Data:  COVID 1/7 > Negative  RVP 1/7 > negative  Antimicrobials:     Interim History / Subjective:  Continues to need BiPAP at night. Clinically no complaints with patient starting to ambulate to chair.  RHC today well tolerated with patient in good spirits.    Objective   Blood pressure (!) 131/54, pulse 75, temperature 97.7 F (36.5 C), temperature source Oral, resp. rate 14, height 5\' 9"  (1.753 m), weight 93.5 kg, SpO2 96 %. CVP:  [1 mmHg-16 mmHg] 14 mmHg  FiO2 (%):  [50 %] 50 %   Intake/Output Summary (Last 24 hours) at 06/02/2020 1311 Last data filed at 06/02/2020 1100 Gross per 24 hour  Intake 780 ml  Output 3150 ml  Net -2370 ml  CVP:  [1 mmHg-16 mmHg] 14 mmHg Filed Weights   05/29/20 0500 05/30/20 0500 05/31/20 1830  Weight: 103.4 kg 100.2 kg 93.5 kg   Physical Exam: General: Obese, chronically ill-appearing, no acute distress HENT: , AT, OP clear, MMM Eyes: EOMI, no scleral icterus Respiratory:  Crackles at both bases.  Cardiovascular: RRR, No signs of RV strain. JVP at 4cm.  GI: BS+, soft, nontender, percutaneous drain in place with minimal bilious output Extremities: edema now much better.  Neuro: AAO x4, CNII-XII grossly intact  Resolved Hospital Problem list   Brief respiratory arrest  1/9  Assessment & Plan:  Acute respiratory failure with hypoxemia and hypercapnia secondary to cor pulmonale and OSA/OHS and decompensated HFpEF - Continue nocturnal BIPAP - Wean FiO2 as tolerated  - Incentive spirometry and mobilization for atelectasis component.  - progressive ambulation but patient deconditioned and no very mobile at baseline.   Acute decompensated HFpEF - Edema much improved, creatinine rising and alkalosis not improved with acetazolamide.  - Furosemide infusion stopped with plan to convert to intermittent dosing/maintenance dose in next few days. BNP to help guide therapy.    Severe pulmonary hypertension - Nitrates stopped and patient transitioned to sildenafil.   Acute on chronic kidney failure stage IIIa, stable Cr 1.45-1.55 --Monitor UOP/Cr --Foley --Strict I&O --Renal dose meds  Acute toxic metabolic encephalopathy secondary to hypercarbia, medications. Improved --Reduced zyprexa dosing 1/13 --Decrease gabapentin 1/15 --Minimize sedating meds --BiPAP nightly  Chronic atrial fibrillation --Home medications include Coumadin --Telemetry  Type 2 diabetes --SSI  Prior acute cholecystitis status post percutaneous drain placed on 11/3 --Cont trend output  --Consider surgery consult for definitive management when stable  Best practice (evaluated daily)  Diet: Heart healthy Pain/Anxiety/Delirium protocol (if indicated): As needed  VAP protocol (if indicated): N/A DVT prophylaxis: Coumadin  GI prophylaxis: PPI Glucose control: SSI, changed to AC at bedtime 1/10 Mobility: Progressive ambulation.  Disposition: Oxygenation has improved sufficiently to allow transfer to IMTS. Order reconciliation completed.   Goals of Care:  Last date of multidisciplinary goals of care discussion: This was completed on 1/13, this included nurse practitioner and bedside nursing staff in addition to family listed below Family and staff present: [name], his daughter  and [name] his son in addition to the patient himself who was able to participate Summary of discussion: The patient, and his family continue to desire full scope of medical care this would include CPR should he suffer a cardiac arrest.  They do however concede that he would not want prolonged life support, tracheostomy, or hemodialysis. Follow up goals of care discussion due: 1/20 Code Status: Full, but should be readdressed in light of poor long term prognosis with pulmonary hypertension.    Lynnell Catalan, MD [facility] ICU Physician [facility]  Pager: [phone] Mobile: [phone] After hours: [phone].

## 2020-06-02 NOTE — Progress Notes (Addendum)
Advanced Heart Failure Rounding Note  PCP-Cardiologist: Nanetta Batty, MD    Patient Profile:    80 y/o male w/ CAD, Afib, Stage III CKD and severe chronic hypoxic/hypercarbic respiratory failure due to OSA/OHS with baseline pCO2 ~ 80. Now with severe RV failure and massive volume overload.   Subjective:    Yesterday lasix drip and metolazone discontinued. No wt charted for today.   On for right heart cath today.  Transient marked bradycardia yesterday down in the 30s but was asymptomatic.   BMP pending.    Objective:   Weight Range: 93.5 kg Body mass index is 30.44 kg/m.   Vital Signs:   Temp:  [97.4 F (36.3 C)-98.1 F (36.7 C)] 97.9 F (36.6 C) (01/20 0400) Pulse Rate:  [54-78] 73 (01/20 0700) Resp:  [7-27] 26 (01/20 0700) BP: (102-142)/(42-90) 140/58 (01/20 0700) SpO2:  [94 %-100 %] 98 % (01/20 0700) FiO2 (%):  [50 %] 50 % (01/20 0600) Last BM Date: 05/30/20  Weight change: Filed Weights   05/29/20 0500 05/30/20 0500 05/31/20 1830  Weight: 103.4 kg 100.2 kg 93.5 kg    Intake/Output:   Intake/Output Summary (Last 24 hours) at 06/02/2020 0746 Last data filed at 06/02/2020 0600 Gross per 24 hour  Intake 246.71 ml  Output 3150 ml  Net -2903.29 ml      Physical Exam   General:   Chronically ill appearing WM, obese, No resp difficulty HEENT: normal Neck: supple. JVP 10 cm . Carotids 2+ bilat; no bruits. No lymphadenopathy or thryomegaly appreciated. Cor: PMI nondisplaced. Irregular rate & rhythm. No rubs, gallops or murmurs. Lungs: decreased BS bilaterally  Abdomen: obese but soft, nontender, nondistended. No hepatosplenomegaly. No bruits or masses. Good bowel sounds. Extremities: no cyanosis, clubbing, rash, trace bilateral LEE Neuro: alert & orientedx3, cranial nerves grossly intact. moves all 4 extremities w/o difficulty. Affect pleasant   Telemetry    A Fib 60-80s had brief episode of bradycardia in 30s yesterday   Labs    CBC Recent  Labs    05/31/20 0500  WBC 7.0  HGB 9.4*  HCT 30.4*  MCV 99.0  PLT 162   Basic Metabolic Panel Recent Labs    81/01/75 0500 06/01/20 0511  NA 132* 130*  K 3.3* 3.0*  CL 75* 72*  CO2 43* 47*  GLUCOSE 135* 112*  BUN 40* 44*  CREATININE 1.62* 1.81*  CALCIUM 9.0 9.2  MG 2.0 2.3   Liver Function Tests No results for input(s): AST, ALT, ALKPHOS, BILITOT, PROT, ALBUMIN in the last 72 hours. No results for input(s): LIPASE, AMYLASE in the last 72 hours. Cardiac Enzymes No results for input(s): CKTOTAL, CKMB, CKMBINDEX, TROPONINI in the last 72 hours.  BNP: BNP (last 3 results) Recent Labs    03/22/20 0117 05/20/20 1133 06/01/20 0511  BNP 441.2* 618.9* 275.2*    ProBNP (last 3 results) No results for input(s): PROBNP in the last 8760 hours.   D-Dimer No results for input(s): DDIMER in the last 72 hours. Hemoglobin A1C No results for input(s): HGBA1C in the last 72 hours. Fasting Lipid Panel No results for input(s): CHOL, HDL, LDLCALC, TRIG, CHOLHDL, LDLDIRECT in the last 72 hours. Thyroid Function Tests No results for input(s): TSH, T4TOTAL, T3FREE, THYROIDAB in the last 72 hours.  Invalid input(s): FREET3  Other results:   Imaging    No results found.   Medications:     Scheduled Medications: . acetaZOLAMIDE  500 mg Oral BID  . allopurinol  100 mg  Oral BID  . chlorhexidine  15 mL Mouth Rinse BID  . Chlorhexidine Gluconate Cloth  6 each Topical Daily  . empagliflozin  10 mg Oral Daily  . ferrous sulfate  325 mg Oral Q breakfast  . gabapentin  100 mg Oral TID  . hydrALAZINE  25 mg Oral BID  . insulin aspart  0-15 Units Subcutaneous TID WC  . isosorbide mononitrate  30 mg Oral Daily  . mouth rinse  15 mL Mouth Rinse q12n4p  . OLANZapine zydis  5 mg Oral QHS  . pantoprazole  40 mg Oral BID AC  . polyethylene glycol  17 g Oral BID  . potassium chloride  40 mEq Oral BID  . pravastatin  40 mg Oral QHS  . senna  1 tablet Oral Daily  . sodium  chloride flush  10-40 mL Intracatheter Q12H  . sodium chloride flush  3 mL Intravenous Q12H  . sodium chloride flush  3 mL Intravenous Q12H  . spironolactone  50 mg Oral Daily  . warfarin  5 mg Oral q1600  . Warfarin - Pharmacist Dosing Inpatient   Does not apply q1600    Infusions: . sodium chloride    . sodium chloride    . sodium chloride 10 mL/hr at 06/02/20 0617    PRN Medications: sodium chloride, sodium chloride, acetaminophen, bisacodyl, Heparin (Porcine) in NaCl, ondansetron (ZOFRAN) IV, sodium chloride flush, sodium chloride flush, sodium chloride flush   Assessment/Plan   1. Acute on chronic heart failure with mid-range EF and severe RV failure:  Echo this admission with EF 45-50%, anteroseptal HK, D-shaped septum with PASP 63 mmHg and dilated IVC.  During 11/21 admission, initial echo showed EF 20% with severe RV dysfunction.  Repeat echo later in hospitalization with EF up to 55% and mild RV dysfunction.  Thought to have stress/Takotsubo cardiomyopathy in setting of septic shock/cholecystitis. Co-ox has been ok. Has diuresed well - Volume status improved. RHC today to assess filling pressures and guide further diuresis   - currently off lasix gtt. Further diuretic dosing TBD per RHC findings  - Continuing diamox per CCM due to contractile alkalosis.  - Continue spiro for now. If renal function worse will need to stop spiro. BMP pending  - no UNNA boots w/ severe PAD. Continue w/ TED hoses  2. Acute on Chronic Hypoxic Respiratory Failure due to end-stage OSA/OHS baseline pCO2 85-95 range - Remains on HFNC. Having difficulty weaning.  - This is truly end-stage. Need to consider tracheostomy but he refuses. - CCM managing 3. Atrial fibrillation: Chronic, rate is controlled. H/o CVA.  - Continue warfarin, INR has been therapeutic, 2.5 yesterday  4. CAD: Anterior MI 1994, CABG 1997 with LIMA-LAD and seq SVG-OM/D.  Had PCI to proximal RCA in 2018.  No chest pain. EF has  generally been in the 50% range (45-50% on echo this admission). HS-TnI minimally elevated with no trend this admission, ACS is unlikely.  - Continue home statin.  - He is on warfarin so no ASA.  - No chest pain.  5. PAD: H/o L=>R fem-fem crossover grafting, had to be removed due to infection and had patch angioplasty.  6. AKI on CKD stage 3: Initial creatinine 1.9 but had gone down and now trending up. Suspect he is dry. Lasix gtt and metolazone currently on hold - RHC today  - f/u BMP pending  7. H/o acute cholecystitis: Has cholecystostomy tube from 11/21.  8. Contractile Alkalosis -  On diamox, BMP pending  9.GOC: prognosis is poor. CCM & palliative care family meeting, continue full support with goal to get him discharged to home.   Length of Stay: [address], PA-C  06/02/2020, 7:46 AM  Advanced Heart Failure Team Pager [phone] (M-F; 7a - 4p)  Please contact [facility] for night-coverage after hours (4p -7a ) and weekends on [email or web address]  Patient seen with PA, agree with the above note.   RHC done today: RHC Procedural Findings: Hemodynamics (mmHg) RA mean 9 RV 59/10 PA 60/22, mean 36 PCWP mean 16 Oxygen saturations: PA 70% AO 98% Cardiac Output (Fick) 7.8  Cardiac Index (Fick) 3.7 PVR 2.6 WU  Patient remains on Bipap this morning.  Creatinine mildly higher at 1.8.  IV Lasix stopped yesterday.   General: NAD, on Bipap Neck: JVP 8-9 cm, no thyromegaly or thyroid nodule.  Lungs: Decreased at bases. CV: Nondisplaced PMI.  Heart irregular S1/S2, no S3/S4, no murmur.  1+ ankle edema. Abdomen: Soft, nontender, no hepatosplenomegaly, no distention.  Skin: Intact without lesions or rashes.  Neurologic: Alert and oriented x 3.  Psych: Normal affect. Extremities: No clubbing or cyanosis.  HEENT: Normal.   Mildly elevated filling pressures with moderate pulmonary hypertension.  PVR not particularly high due to relatively high cardiac output.  - As PCWP is  not markedly elevated, I will trial him on sildenafil 20 mg tid and stop Imdur.  - Start torsemide 40 mg daily and decrease acetazolamide to once daily.  - Replace K.  - Follow creatinine closely.    I think that the main issue at this point driving hypoxemia remains OHS/OSA, which is severe/end stage.   Continue warfarin for atrial fibrillation.   SBP soft at times, will stop hydralazine for now.  Continue spironolactone.   CRITICAL CARE Performed by: Marca Ancona  Total critical care time: 35 minutes  Critical care time was exclusive of separately billable procedures and treating other patients.  Critical care was necessary to treat or prevent imminent or life-threatening deterioration.  Critical care was time spent personally by me on the following activities: development of treatment plan with patient and/or surrogate as well as nursing, discussions with consultants, evaluation of patient's response to treatment, examination of patient, obtaining history from patient or surrogate, ordering and performing treatments and interventions, ordering and review of laboratory studies, ordering and review of radiographic studies, pulse oximetry and re-evaluation of patient's condition.  Marca Ancona 06/02/2020 9:07 AM

## 2020-06-02 NOTE — Progress Notes (Signed)
PT Cancellation Note  Patient Details Name: Isaiah [name] MRN: [id number] DOB: 08-14-1940   Cancelled Treatment:    Reason Eval/Treat Not Completed: Patient at procedure or test/unavailable - at cath lab, PT to check back later.  [name], PT Acute Rehabilitation Services Pager [phone]  Office [phone]    [name] 06/02/2020, 8:48 AM

## 2020-06-02 NOTE — Telephone Encounter (Signed)
Sent in Manufacturer's Assistance application to [employer] for [employer].   Application pending, will continue to follow.  Zaelyn Noack, PharmD, BCPS, BCCP, CPP Heart Failure Clinic Pharmacist [phone]  

## 2020-06-02 NOTE — Interval H&P Note (Signed)
History and Physical Interval Note:  06/02/2020 8:27 AM  Isaiah [name]  has presented today for surgery, with the diagnosis of heart failure.  The various methods of treatment have been discussed with the patient and family. After consideration of risks, benefits and other options for treatment, the patient has consented to  Procedure(s): RIGHT HEART CATH (N/A) as a surgical intervention.  The patient's history has been reviewed, patient examined, no change in status, stable for surgery.  I have reviewed the patient's chart and labs.  Questions were answered to the patient's satisfaction.     Cheyenna Pankowski [employer]

## 2020-06-02 NOTE — Progress Notes (Signed)
ANTICOAGULATION CONSULT NOTE  Pharmacy Consult for warfarin Indication: atrial fibrillation  No Known Allergies  Patient Measurements: Height: 5\' 9"  (175.3 cm) Weight: 93.5 kg (206 lb 2.1 oz) IBW/kg (Calculated) : 70.7  Vital Signs: Temp: 97.7 F (36.5 C) (01/20 1200) Temp Source: Oral (01/20 1200) BP: 131/54 (01/20 1300) Pulse Rate: 75 (01/20 1300)  Labs: Recent Labs    05/31/20 0500 06/01/20 0511 06/02/20 0841 06/02/20 1047  HGB 9.4*  --  11.6*  11.9*  --   HCT 30.4*  --  34.0*  35.0*  --   PLT 162  --   --   --   LABPROT 27.7* 26.2*  --  23.6*  INR 2.7* 2.5*  --  2.2*  CREATININE 1.62* 1.81*  --  1.65*    Estimated Creatinine Clearance: 41 mL/min (A) (by C-G formula based on SCr of 1.65 mg/dL (H)).   Assessment: 80 year old male admitted for HF exacerbation. History of afib on warfarin, home dose is 6 mg daily.   INR remains therapeutic at 2.2 although trending down. Hgb 11s on istat will check cbc in am. No bleeding issues noted.   Goal of Therapy:  INR 2-3 Monitor platelets by anticoagulation protocol: Yes   Plan:  Warfarin 7.5 mg PO daily  Daily PT/INR  CBC in am  66 PharmD., BCPS Clinical Pharmacist 06/02/2020 4:02 PM  Please check AMION for all [facility] Pharmacy phone numbers After 10:00 PM, call Main Pharmacy [phone]

## 2020-06-02 NOTE — Progress Notes (Signed)
Physical Therapy Treatment Patient Details Name: Isaiah [name] MRN: [id number] DOB: 01/18/1941 Today's Date: 06/02/2020    History of Present Illness 80 year old male presented with progressive shortness of breath and hypoxia with bilateral lower extremity swelling on admission.  Admitted per teaching service for acute CHF exacerbation and volume overload.  Morning of 05/22/2020 patient suffered brief respiratory arrest for which PCCM was consulted for further management.  s/p R heart cath 1/20. PMHx:  d CHF, DM2. (ropr CVA. afob. PSA. JTM. CAD. CLD stage 3.    PT Comments    Pt sleeping upon arrival to room, and with slightly AMS after waking versus previous notes. Pt disoriented to location and situation, but appeared to improve with waking up more. Pt ambulated short room distance with use of RW today, overall requiring min-mod assist for mobility tasks. Pt eager to exercise LEs, performed well. Will continue to follow acutely.    Follow Up Recommendations  Home health PT;Supervision/Assistance - 24 hour;Other (comment) (may need therapy at SNF if doesn't improve as expected)     Equipment Recommendations  Other (comment) (BSC  TBD)    Recommendations for Other Services       Precautions / Restrictions Precautions Precautions: Fall;Other (comment) Precaution Comments: watch O2, 12LO2 HFNC Restrictions Weight Bearing Restrictions: No    Mobility  Bed Mobility Overal bed mobility: Needs Assistance Bed Mobility: Supine to Sit     Supine to sit: Mod assist     General bed mobility comments: Mod assist for trunk elevation, LE management, and scooting to EOB. Increased time, cuing for hand placement.  Transfers Overall transfer level: Needs assistance Equipment used: Rolling walker (2 wheeled) Transfers: Sit to/from Stand Sit to Stand: Min assist         General transfer comment: Min assist for boost up and steadying, verbal cuing for hand placement when  rising.  Ambulation/Gait Ambulation/Gait assistance: Min assist Gait Distance (Feet): 5 Feet Assistive device: Rolling walker (2 wheeled) Gait Pattern/deviations: Step-through pattern;Decreased stride length;Trunk flexed Gait velocity: decr   General Gait Details: min assist to steady, manage lines/leads. on 15LO2 during gait due to tank not able to be set to 12L, vss   Stairs             Wheelchair Mobility    Modified Rankin (Stroke Patients Only)       Balance Overall balance assessment: Needs assistance Sitting-balance support: Feet supported;No upper extremity supported;Single extremity supported Sitting balance-Leahy Scale: Fair     Standing balance support: Bilateral upper extremity supported Standing balance-Leahy Scale: Poor Standing balance comment: reliant on the RW and/or external support                            Cognition Arousal/Alertness: Awake/alert Behavior During Therapy: WFL for tasks assessed/performed Overall Cognitive Status: Impaired/Different from baseline Area of Impairment: Orientation;Following commands;Safety/judgement;Problem solving                 Orientation Level: Disoriented to;Place;Situation     Following Commands: Follows one step commands with increased time Safety/Judgement: Decreased awareness of deficits;Decreased awareness of safety   Problem Solving: Decreased initiation;Difficulty sequencing;Requires verbal cues;Requires tactile cues General Comments: Sleeping upon arrival, initially disoriented and when PT asked pt where he was, pt states "I'm in the hospital" but in response to what city pt states with laughing "I really couldn't tell you". Pt saying some off-the-wall things during session as well. Requires repeated, loud verbal cuing  for mobility.      Exercises General Exercises - Lower Extremity Ankle Circles/Pumps: AROM;Both;15 reps;Supine Quad Sets: AROM;Both;10 reps;Supine Heel Slides:  AROM;Both;5 reps;Supine Hip ABduction/ADduction: Both;AAROM;10 reps;Supine Straight Leg Raises: AROM;Both;5 reps;Supine    General Comments        Pertinent Vitals/Pain Pain Assessment: Faces Faces Pain Scale: Hurts a little bit Pain Location: LEs Pain Descriptors / Indicators: Tightness Pain Intervention(s): Limited activity within patient's tolerance;Monitored during session;Repositioned;Other (comment) (LE exercise)    Home Living                      Prior Function            PT Goals (current goals can now be found in the care plan section) Acute Rehab PT Goals PT Goal Formulation: With patient Time For Goal Achievement: 06/09/20 Potential to Achieve Goals: Fair Progress towards PT goals: Progressing toward goals    Frequency    Min 3X/week      PT Plan Current plan remains appropriate    Co-evaluation              AM-PAC PT "6 Clicks" Mobility   Outcome Measure  Help needed turning from your back to your side while in a flat bed without using bedrails?: A Little Help needed moving from lying on your back to sitting on the side of a flat bed without using bedrails?: A Lot Help needed moving to and from a bed to a chair (including a wheelchair)?: A Lot Help needed standing up from a chair using your arms (e.g., wheelchair or bedside chair)?: A Little Help needed to walk in hospital room?: A Little Help needed climbing 3-5 steps with a railing? : A Lot 6 Click Score: 15    End of Session Equipment Utilized During Treatment: Gait belt;Oxygen Activity Tolerance: Patient tolerated treatment well;Patient limited by fatigue Patient left: in chair;with call bell/phone within reach;with chair alarm set Nurse Communication: Mobility status PT Visit Diagnosis: Other abnormalities of gait and mobility (R26.89);Muscle weakness (generalized) (M62.81);Difficulty in walking, not elsewhere classified (R26.2)     Time: 7510-2585 PT Time Calculation (min)  (ACUTE ONLY): 36 min  Charges:  $Therapeutic Exercise: 8-22 mins $Therapeutic Activity: 8-22 mins                     [name], PT Acute Rehabilitation Services Pager [phone]  Office [phone]  [name] 06/02/2020, 5:16 PM

## 2020-06-03 DIAGNOSIS — I50810 Right heart failure, unspecified: Secondary | ICD-10-CM | POA: Diagnosis not present

## 2020-06-03 DIAGNOSIS — Z794 Long term (current) use of insulin: Secondary | ICD-10-CM

## 2020-06-03 DIAGNOSIS — J9621 Acute and chronic respiratory failure with hypoxia: Secondary | ICD-10-CM | POA: Diagnosis not present

## 2020-06-03 DIAGNOSIS — I5021 Acute systolic (congestive) heart failure: Secondary | ICD-10-CM

## 2020-06-03 DIAGNOSIS — J9622 Acute and chronic respiratory failure with hypercapnia: Secondary | ICD-10-CM | POA: Diagnosis not present

## 2020-06-03 LAB — CBC
HCT: 30.7 % — ABNORMAL LOW (ref 39.0–52.0)
Hemoglobin: 9.6 g/dL — ABNORMAL LOW (ref 13.0–17.0)
MCH: 30.3 pg (ref 26.0–34.0)
MCHC: 31.3 g/dL (ref 30.0–36.0)
MCV: 96.8 fL (ref 80.0–100.0)
Platelets: 169 10*3/uL (ref 150–400)
RBC: 3.17 MIL/uL — ABNORMAL LOW (ref 4.22–5.81)
RDW: 14.9 % (ref 11.5–15.5)
WBC: 5.9 10*3/uL (ref 4.0–10.5)
nRBC: 0 % (ref 0.0–0.2)

## 2020-06-03 LAB — BASIC METABOLIC PANEL
Anion gap: 13 (ref 5–15)
BUN: 46 mg/dL — ABNORMAL HIGH (ref 8–23)
CO2: 33 mmol/L — ABNORMAL HIGH (ref 22–32)
Calcium: 8.9 mg/dL (ref 8.9–10.3)
Chloride: 81 mmol/L — ABNORMAL LOW (ref 98–111)
Creatinine, Ser: 1.84 mg/dL — ABNORMAL HIGH (ref 0.61–1.24)
GFR, Estimated: 37 mL/min — ABNORMAL LOW (ref >60)
Glucose, Bld: 161 mg/dL — ABNORMAL HIGH (ref 70–99)
Potassium: 4.7 mmol/L (ref 3.5–5.1)
Sodium: 127 mmol/L — ABNORMAL LOW (ref 135–145)

## 2020-06-03 LAB — GLUCOSE, CAPILLARY
Glucose-Capillary: 146 mg/dL — ABNORMAL HIGH (ref 70–99)
Glucose-Capillary: 148 mg/dL — ABNORMAL HIGH (ref 70–99)
Glucose-Capillary: 155 mg/dL — ABNORMAL HIGH (ref 70–99)
Glucose-Capillary: 138 mg/dL — ABNORMAL HIGH (ref 70–99)

## 2020-06-03 LAB — PROTIME-INR
INR: 2.1 — ABNORMAL HIGH (ref 0.8–1.2)
Prothrombin Time: 23.1 seconds — ABNORMAL HIGH (ref 11.4–15.2)

## 2020-06-03 LAB — MAGNESIUM: Magnesium: 2.2 mg/dL (ref 1.7–2.4)

## 2020-06-03 MED ORDER — WARFARIN SODIUM 5 MG PO TABS
5.0000 mg | ORAL_TABLET | Freq: Once | ORAL | Status: AC
  Administered 2020-06-03: 5 mg via ORAL
  Filled 2020-06-03: qty 1

## 2020-06-03 NOTE — Progress Notes (Signed)
Subjective:   He feels like he is breathing well. Denies pain anywhere. He is oriented to place and self but is unable to answer the month.   Objective:  Vital signs in last 24 hours: Vitals:   06/02/20 2338 06/03/20 0415 06/03/20 0445 06/03/20 0501  BP:   (!) 118/45   Pulse: 81 85 69 65  Resp: (!) 25 (!) 21 18 18   Temp:   98.1 F (36.7 C)   TempSrc:   Oral   SpO2: 99% 99% 100% 98%  Weight:      Height:        Physical Exam Constitutional: no acute distress, mildly hard of hearing Head: atraumatic ENT: external ears normal Eyes: EOMI Cardiovascular: regular rate and rhythm, normal heart sounds, no pitting edema Pulmonary: effort normal, crackles at lung bases bilaterally, breathing comfortably  Abdominal: flat, bowel sounds active. Cholecystostomy bag in place with bilious and sanguinous drainage Skin: warm and dry, RLE with small unstageable pressure ulcer to lateral aspect of 2nd toe where toenail on 3rd toe is pressing against it, small blood blister on distal aspect of 3rd toe, subungual blood blister to 5th toenail, soft area to posterior R heel with decreased blanching which is an early pressure injury Neurological: alert, no focal deficit Psychiatric: normal mood and affect  Assessment/Plan: Isaiah [name] is a 80 y.o. male with hx of HFpEF (EF 55 to 60%), CAD, DM, CVA, CKD 3, HLD, OSA, A. fib on Coumadin presenting with shortness of breath.  Volume up on exam and required BiPAP, now on nasal cannula. Diuresing.  Active Problems:   AKI (acute kidney injury) (HCC)   Acute exacerbation of congestive heart failure (HCC)   Acute on chronic respiratory failure with hypoxia and hypercapnia (HCC)   Acute on chronic hypoxic and hypercapnic respiratory failure Cor pulmonale OSA Pulmonary hypertension HFmrEF exacerbation Presented volume up on exam and had pulmonary edema as well as worsening bilateral pleural effusions on chest x-ray.  Last echo on 1/9 with LVEF 45 to  50% and severely elevated pulmonary artery pressure.  Had biventricular dysfunction on prior echo, but more recent shows normal RV function. Had R heart cath on 1/20 which showed moderate pulmonary HTN and moderately elevated R and L heart filling pressures. OSA and likely OHS also contributing.  Initially managed on BiPAP then weaned off, but had a brief respiratory arrest on 1/9 for which was admitted to the ICU and placed back on BiPAP.  Ultimately did not require intubation.  Transferred out of ICU on 1/19.  BNP down to 275 from 618 on admission, and weight down 45 pounds.   - Heart failure team consulted, appreciate assistance     -  Torsemide 40mg      - stop spironolactone 50mg      - Jardiance 10mg      - continue TED hose, no unna boots due to PAD - appreciate PCCM asisstance     - continue sildinafil 20mg  TID     - stop hydralazine     - stop Imdur - Fluid restriction - BiPAP at night - Likely exchange home CPAP for BiPAP on discharge  Goals of care Goals of care were discussed with family on 1/18 and they want full scope of care and full code, but not prolonged life support, tracheostomy, or hemodialysis.  They understand he has a poor prognosis. - Appreciate palliative care consult  Pressure injuries Has had long hospital course. Noted to have the beginning of a pressure  injury to R heel, small pressure injury to left 2nd digit from pressing against the 3rd digit toenail, and blood blisters to distal 3rd toe and subgunal of 5th toe. - start prevalon boots - small dressing to R 2nd toe - continue to monitor  AKI on CKD 3 Creatinine was 1.90 on admission, today is 1.82 < 1.65 < 1.81. Baseline around 1.3.  Likely prerenal/cardiorenal, but also in setting of diuresis. - Diuretics as above, stopping spironolactone - Daily BMP - Avoid nephrotoxins  Contraction alkalosis Most recent bicarb of 47, thought due to contraction alkalosis from diuresis for HFmrEF. He is down 32L from  admission.  - continue diamox 500mg  daily per CCM - daily bicarb level  Recent acute cholecystitis complicated by sepsis Recent hospitalization for acute cholecystitis and sepsis complicated by brief cardiac arrest.  Initial plan for cardiac clearance and cholecystectomy in 8 weeks, but underwent IR Coley cystotomy for severity of illness on 11/12 and had a brief cardiac arrest during this procedure.   -trend output -Need to clarify plan before discharge  DM type II Home regimen of 70/30 50 units twice daily and glipizide 5 mg twice daily.  Currently fairly controlled with SSI. -Continue SSI  HTN -home hydralazine 100 mg twice daily, Imdur 60 mg daily, and Lasix -holding BP meds for soft BPs  A fib Rate controlled. -Continue home Coumadin  CAD PAD  had MI in 1994, CABG 1997 with LIMA-LAD and seq SVG-OD. Had PCI to proximal RCA in 2018. Last catheterization in March 2018 with patent SVG-OD, patent LIMA to LAD, 80% stenosis of RCA which was stented. -Continue home pravastatin 40 mg, Imdur 60 mg, warfarin as above -No Unna boots given PAD   Diet: heart healthy IVF: None VTE: Coumadin Prior to Admission Living Arrangement: Home Anticipated Discharge Location: Home Barriers to Discharge: Respiratory failure Dispo: Anticipated discharge in approximately 3-5 day(s).   April 2018, MD 06/03/2020, 6:52 AM Pager: [phone] After 5pm on weekdays and 1pm on weekends: On Call pager [phone]

## 2020-06-03 NOTE — Progress Notes (Signed)
ANTICOAGULATION CONSULT NOTE  Pharmacy Consult for warfarin Indication: atrial fibrillation  No Known Allergies  Patient Measurements: Height: 5\' 9"  (175.3 cm) Weight: 93.5 kg (206 lb 2.1 oz) IBW/kg (Calculated) : 70.7  Vital Signs: Temp: 98 F (36.7 C) (01/21 1317) Temp Source: Oral (01/21 1317) BP: 122/49 (01/21 1317) Pulse Rate: 73 (01/21 1317)  Labs: Recent Labs    06/01/20 0511 06/02/20 0841 06/02/20 1047 06/03/20 0302  HGB  --  11.6*  11.9*  --  9.6*  HCT  --  34.0*  35.0*  --  30.7*  PLT  --   --   --  169  LABPROT 26.2*  --  23.6* 23.1*  INR 2.5*  --  2.2* 2.1*  CREATININE 1.81*  --  1.65* 1.84*    Estimated Creatinine Clearance: 36.7 mL/min (A) (by C-G formula based on SCr of 1.84 mg/dL (H)).   Assessment: 80 year old male admitted for HF exacerbation. History of afib on warfarin, home dose is 6 mg daily.   INR remains therapeutic at 2.1 although trending down slightly after a boost dose last pm. Hgb . No bleeding issues noted.   Goal of Therapy:  INR 2-3 Monitor platelets by anticoagulation protocol: Yes   Plan:  Warfarin 5 mg PO tonight  Daily PT/INR  CBC in am    66 Pharm.D. CPP, BCPS Clinical Pharmacist [phone] 06/03/2020 4:39 PM    Please check AMION for all [facility] phone numbers After 10:00 PM, call Main Pharmacy [phone]

## 2020-06-03 NOTE — Progress Notes (Signed)
Pt's HR dropped to high 30s to low 40s. Pt was asymptomatic. CHF team made aware.

## 2020-06-03 NOTE — Care Management Important Message (Signed)
Important Message  Patient Details  Name: Isaiah [name] MRN: [id number] Date of Birth: [birth year]   Medicare Important Message Given:  Yes     [name] 06/03/2020, 11:19 AM

## 2020-06-03 NOTE — Progress Notes (Addendum)
Advanced Heart Failure Rounding Note  PCP-Cardiologist: Nanetta Batty, MD    Patient Profile:    80 y/o male w/ CAD, Afib, Stage III CKD and severe chronic hypoxic/hypercarbic respiratory failure due to OSA/OHS with baseline pCO2 ~ 80. Now with severe RV failure and massive volume overload.   Subjective:    RHC done yesterday showing mildly elevated filling pressures with moderate pulmonary hypertension.  PVR not particularly high due to relatively high cardiac output. See complete details below.   Started on trial of sildenafil 20 mg tid.  Diuretics transitioned to PO.   Wt not charted today. SCr higher, 1.6>>1.8   RHC Procedural Findings: Hemodynamics (mmHg) RA mean 9 RV 59/10 PA 60/22, mean 36 PCWP mean 16 Oxygen saturations: PA 70% AO 98% Cardiac Output (Fick) 7.8  Cardiac Index (Fick) 3.7 PVR 2.6 WU   Objective:   Weight Range: 93.5 kg Body mass index is 30.44 kg/m.   Vital Signs:   Temp:  [97.7 F (36.5 C)-98.1 F (36.7 C)] 98.1 F (36.7 C) (01/21 0445) Pulse Rate:  [65-85] 65 (01/21 0501) Resp:  [13-26] 18 (01/21 0501) BP: (76-131)/(33-91) 118/45 (01/21 0445) SpO2:  [95 %-100 %] 98 % (01/21 0501) FiO2 (%):  [50 %] 50 % (01/20 0908) Last BM Date: 06/02/20  Weight change: Filed Weights   05/29/20 0500 05/30/20 0500 05/31/20 1830  Weight: 103.4 kg 100.2 kg 93.5 kg    Intake/Output:   Intake/Output Summary (Last 24 hours) at 06/03/2020 0853 Last data filed at 06/02/2020 2040 Gross per 24 hour  Intake 1140 ml  Output 1200 ml  Net -60 ml      Physical Exam   PHYSICAL EXAM: General:  Chronically ill appearing, obese. No respiratory difficulty HEENT: normal Neck: supple. no JVD. Carotids 2+ bilat; no bruits. No lymphadenopathy or thyromegaly appreciated. Cor: PMI nondisplaced. Regular rate & rhythm. No rubs, gallops or murmurs. Lungs: clear Abdomen: soft, nontender, nondistended. No hepatosplenomegaly. No bruits or masses. Good bowel  sounds. Extremities: no cyanosis, clubbing, rash, edema Neuro: alert & oriented x 3, cranial nerves grossly intact. moves all 4 extremities w/o difficulty. Affect pleasant.    Telemetry    A Fib 60-80s, transient bradycardia in the 40s earlier today   Labs    CBC Recent Labs    06/02/20 0841 06/03/20 0302  WBC  --  5.9  HGB 11.6*  11.9* 9.6*  HCT 34.0*  35.0* 30.7*  MCV  --  96.8  PLT  --  169   Basic Metabolic Panel Recent Labs    60/73/71 0511 06/02/20 0841 06/02/20 1047 06/03/20 0302  NA 130*   < > 130* 127*  K 3.0*   < > 3.9 4.7  CL 72*  --  77* 81*  CO2 47*  --  40* 33*  GLUCOSE 112*  --  155* 161*  BUN 44*  --  42* 46*  CREATININE 1.81*  --  1.65* 1.84*  CALCIUM 9.2  --  8.9 8.9  MG 2.3  --   --  2.2   < > = values in this interval not displayed.   Liver Function Tests No results for input(s): AST, ALT, ALKPHOS, BILITOT, PROT, ALBUMIN in the last 72 hours. No results for input(s): LIPASE, AMYLASE in the last 72 hours. Cardiac Enzymes No results for input(s): CKTOTAL, CKMB, CKMBINDEX, TROPONINI in the last 72 hours.  BNP: BNP (last 3 results) Recent Labs    03/22/20 0117 05/20/20 1133 06/01/20 0511  BNP  441.2* 618.9* 275.2*    ProBNP (last 3 results) No results for input(s): PROBNP in the last 8760 hours.   D-Dimer No results for input(s): DDIMER in the last 72 hours. Hemoglobin A1C No results for input(s): HGBA1C in the last 72 hours. Fasting Lipid Panel No results for input(s): CHOL, HDL, LDLCALC, TRIG, CHOLHDL, LDLDIRECT in the last 72 hours. Thyroid Function Tests No results for input(s): TSH, T4TOTAL, T3FREE, THYROIDAB in the last 72 hours.  Invalid input(s): FREET3  Other results:   Imaging    DG CHEST PORT 1 VIEW  Result Date: 06/02/2020 CLINICAL DATA:  Pneumothorax. EXAM: PORTABLE CHEST 1 VIEW COMPARISON:  05/26/2018 to FINDINGS: No evidence for pneumothorax. The cardio pericardial silhouette is enlarged. Vascular  congestion with interstitial pulmonary edema pattern. Bibasilar atelectasis/infiltrate with bilateral pleural effusions. Right PICC line tip overlies the SVC/RA junction. Telemetry leads overlie the chest. IMPRESSION: 1. No evidence for pneumothorax. 2. Interstitial pulmonary edema pattern with bibasilar atelectasis/infiltrate and bilateral pleural effusions. Electronically Signed   By: Kennith Center M.D.   On: 06/02/2020 09:50     Medications:     Scheduled Medications: . acetaZOLAMIDE  500 mg Oral Daily  . allopurinol  100 mg Oral BID  . chlorhexidine  15 mL Mouth Rinse BID  . Chlorhexidine Gluconate Cloth  6 each Topical Daily  . empagliflozin  10 mg Oral Daily  . ferrous sulfate  325 mg Oral Q breakfast  . gabapentin  100 mg Oral TID  . insulin aspart  0-15 Units Subcutaneous TID WC  . mouth rinse  15 mL Mouth Rinse q12n4p  . OLANZapine zydis  5 mg Oral QHS  . pantoprazole  40 mg Oral BID AC  . polyethylene glycol  17 g Oral BID  . pravastatin  40 mg Oral QHS  . senna  1 tablet Oral Daily  . sildenafil  20 mg Oral TID  . sodium chloride flush  10-40 mL Intracatheter Q12H  . spironolactone  50 mg Oral Daily  . torsemide  40 mg Oral Daily  . Warfarin - Pharmacist Dosing Inpatient   Does not apply q1600    Infusions:   PRN Medications: acetaminophen, bisacodyl, ondansetron (ZOFRAN) IV, sodium chloride flush   Assessment/Plan   1. Acute on chronic heart failure with mid-range EF and severe RV failure:  Echo this admission with EF 45-50%, anteroseptal HK, D-shaped septum with PASP 63 mmHg and dilated IVC.  During 11/21 admission, initial echo showed EF 20% with severe RV dysfunction.  Repeat echo later in hospitalization with EF up to 55% and mild RV dysfunction.  Thought to have stress/Takotsubo cardiomyopathy in setting of septic shock/cholecystitis. Co-ox has been ok. Has diuresed well. RHC after diuresis showed  mildly elevated filling pressures with moderate pulmonary  hypertension.  PVR not particularly high due to relatively high cardiac output.  As PCWP was not markedly elevated, he was started on trial of sildenafil 20 mg tid. Diuretics changed to PO, now on torsemide 40 daily + diamox  once daily - Torsemide 40 mg daily  - Continuing diamox per CCM due to contractile alkalosis.  - Given rising SCr and K, will stop Cleda Daub   - no UNNA boots w/ severe PAD. Continue w/ TED hoses  2. Acute on Chronic Hypoxic Respiratory Failure due to end-stage OSA/OHS baseline pCO2 85-95 range - Remains on HFNC. Having difficulty weaning.  - This is truly end-stage. Need to consider tracheostomy but he refuses. - CCM managing 3. Atrial fibrillation: Chronic,  rate is controlled. H/o CVA.  - Continue warfarin, INR has been therapeutic, 2.1 today  4. CAD: Anterior MI 1994, CABG 1997 with LIMA-LAD and seq SVG-OM/D.  Had PCI to proximal RCA in 2018.  No chest pain. EF has generally been in the 50% range (45-50% on echo this admission). HS-TnI minimally elevated with no trend this admission, ACS is unlikely.  - Continue home statin.  - He is on warfarin so no ASA.  - No chest pain.  5. PAD: H/o L=>R fem-fem crossover grafting, had to be removed due to infection and had patch angioplasty.  6. AKI on CKD stage 3: Initial creatinine 1.9 but had gone down and now trending up. 1.65>>1.84 today. High CO on cath yesterday  - hold spiro for now - follow BMP 7. H/o acute cholecystitis: Has cholecystostomy tube from 11/21.  8. Contractile Alkalosis -  On diamox 9.GOC: prognosis is poor. CCM & palliative care family meeting, continue full support with goal to get him discharged to home.   Length of Stay: [address] PA-C  06/03/2020, 8:53 AM  Advanced Heart Failure Team Pager [phone] (M-F; 7a - 4p)  Please contact [facility] for night-coverage after hours (4p -7a ) and weekends on [email or web address]  Patient seen with PA, agree with the above note.   Feels ok this morning,  on 10 L HFNC.  No CVP hooked up, no weight for a couple of days.    General: NAD Neck: JVP 8-9 cm, no thyromegaly or thyroid nodule.  Lungs: Distant BS. CV: Nondisplaced PMI.  Heart regular S1/S2, no S3/S4, no murmur.  No peripheral edema.  Abdomen: Soft, nontender, no hepatosplenomegaly, no distention.  Skin: Intact without lesions or rashes.  Neurologic: Alert and oriented x 3.  Psych: Normal affect. Extremities: No clubbing or cyanosis.  HEENT: Normal.   RHC yesterday showed mildly elevated filling pressures with moderate pulmonary hypertension.  PVR not particularly high due to relatively high cardiac output. Mild residual volume overload, suspect we will not get CVP to normal due to RV failure from OHS/OSA.  - As PCWP is not markedly elevated, I started him on sildenafil 20 mg tid and stopped Imdur.  - Continue torsemide 40 mg daily, will give 1 more day of acetazolamide.  - Creatinine mildly higher, will stop spironolactone for now.  - Need to follow CVP.  - Need to follow weights.   I think that the main issue at this point driving hypoxemia remains OHS/OSA, which is severe/end stage.   Continue warfarin for atrial fibrillation.   Mobilize, out of bed with PT.   Marca Ancona 06/03/2020 9:23 AM

## 2020-06-03 NOTE — Progress Notes (Signed)
Occupational Therapy Treatment Patient Details Name: Isaiah [name] MRN: [id number] DOB: 07-May-1941 Today's Date: 06/03/2020    History of present illness 80 year old male presented with progressive shortness of breath and hypoxia with bilateral lower extremity swelling on admission.  Admitted per teaching service for acute CHF exacerbation and volume overload.  Morning of 05/22/2020 patient suffered brief respiratory arrest for which PCCM was consulted for further management.  s/p R heart cath 1/20. PMHx:  d CHF, DM2. (ropr CVA. afob. PSA. JTM. CAD. CLD stage 3.   OT comments  Pt sleeping seated in chair upon arrival, awakened easily. Participated in seated grooming and use of urinal in standing. Sp02 94-96% on 11L 02.   Follow Up Recommendations  SNF;Supervision/Assistance - 24 hour    Equipment Recommendations  3 in 1 bedside commode;Wheelchair (measurements OT);Wheelchair cushion (measurements OT);Hospital bed    Recommendations for Other Services      Precautions / Restrictions Precautions Precautions: Fall;Other (comment) Precaution Comments: watch O2, 11LO2 HFNC       Mobility Bed Mobility               General bed mobility comments: pt received in chair  Transfers Overall transfer level: Needs assistance Equipment used: Rolling walker (2 wheeled) Transfers: Sit to/from Stand Sit to Stand: Mod assist         General transfer comment: cues for hand placement, mod assist to rise and steady from recliner    Balance Overall balance assessment: Needs assistance   Sitting balance-Leahy Scale: Fair       Standing balance-Leahy Scale: Poor Standing balance comment: reliant on the RW and min external support while using urinal                           ADL either performed or assessed with clinical judgement   ADL Overall ADL's : Needs assistance/impaired Eating/Feeding: Set up;Sitting   Grooming: Oral care;Minimal assistance;Sitting Grooming  Details (indicate cue type and reason): cleaned dentures and replaced fixodent                 Toilet Transfer: Moderate assistance;RW   Toileting- Clothing Manipulation and Hygiene: Maximal assistance;Sit to/from stand               Vision       Perception     Praxis      Cognition Arousal/Alertness: Awake/alert Behavior During Therapy: WFL for tasks assessed/performed Overall Cognitive Status: Impaired/Different from baseline Area of Impairment: Orientation;Following commands;Problem solving;Safety/judgement                 Orientation Level: Disoriented to;Place;Situation     Following Commands: Follows one step commands with increased time Safety/Judgement: Decreased awareness of deficits;Decreased awareness of safety   Problem Solving: Decreased initiation;Difficulty sequencing;Requires verbal cues;Requires tactile cues          Exercises     Shoulder Instructions       General Comments      Pertinent Vitals/ Pain       Pain Assessment: No/denies pain  Home Living                                          Prior Functioning/Environment              Frequency  Min 2X/week        Progress Toward  Goals  OT Goals(current goals can now be found in the care plan section)  Progress towards OT goals: Progressing toward goals  Acute Rehab OT Goals Patient Stated Goal: Like to take pt home. OT Goal Formulation: With patient Time For Goal Achievement: 06/11/20 Potential to Achieve Goals: Good  Plan Discharge plan remains appropriate    Co-evaluation                 AM-PAC OT "6 Clicks" Daily Activity     Outcome Measure   Help from another person eating meals?: None Help from another person taking care of personal grooming?: A Little Help from another person toileting, which includes using toliet, bedpan, or urinal?: A Lot Help from another person bathing (including washing, rinsing, drying)?: A  Lot Help from another person to put on and taking off regular upper body clothing?: A Little Help from another person to put on and taking off regular lower body clothing?: Total 6 Click Score: 15    End of Session Equipment Utilized During Treatment: Gait belt;Rolling walker;Oxygen  OT Visit Diagnosis: Unsteadiness on feet (R26.81);Muscle weakness (generalized) (M62.81)   Activity Tolerance Patient tolerated treatment well   Patient Left in chair;with call bell/phone within reach;with chair alarm set   Nurse Communication          Time: 5038-8828 OT Time Calculation (min): 33 min  Charges: OT General Charges $OT Visit: 1 Visit OT Treatments $Self Care/Home Management : 23-37 mins  Martie Round, OTR/L Acute Rehabilitation Services Pager: [phone] Office: [phone]   Evern Bio 06/03/2020, 4:15 PM

## 2020-06-03 NOTE — Progress Notes (Signed)
Tele informed RN that pt had a 2.32 sec pause and had another bradycardic episode 38-40 bpm for about a minute. RN checked on pt, he denied any symptoms.

## 2020-06-03 NOTE — Progress Notes (Signed)
  Mobility Specialist Criteria Algorithm Info.  SATURATION QUALIFICATIONS: (This note is used to comply with regulatory documentation for home oxygen)  Patient Saturations on Room Air at Rest = n/a%  Patient Saturations on Room Air while Ambulating = n/a%  Patient Saturations on 11 Liters of oxygen while Ambulating = 96%  Please briefly explain why patient needs home oxygen:  Mobility Team:  [facility] elevated:HOB 30 Activity: Ambulated in room; Transferred:  Bed to chair; Sat and stood x 3 Range of motion: Active; All extremities Level of assistance: Moderate assist, patient does 50-74% Assistive device: Front wheel walker Minutes sitting in chair:  Minutes stood: 4 minutes Minutes ambulated: 4 minutes Distance ambulated (ft): 5 ft Mobility response: Tolerated fair; RN notified Bed Position: Chair  Patient received lying supine in bed asleep, very hard to arouse and HOH. After multiple attempts to stimulate patient he awoke and agreed to participate in mobility. Required heavy mod assist to EOB and maximal assist to lift trunk into sitting position. Cues for sequencing of hand placement and initiate movement. He stood with mod A from elevated surface and took a few steps to recliner chair. While doing so patient had a simultaneous episode of bowel and urine incontinence requiring max A+2 to clean. Tolerated ambulation fair requiring mod/max assist +2 and cues for safety, after patient got cleaned up he sat in recliner chair with all needs met and no complaints.   06/03/2020 3:22 PM

## 2020-06-04 DIAGNOSIS — J9621 Acute and chronic respiratory failure with hypoxia: Secondary | ICD-10-CM | POA: Diagnosis not present

## 2020-06-04 DIAGNOSIS — J9622 Acute and chronic respiratory failure with hypercapnia: Secondary | ICD-10-CM | POA: Diagnosis not present

## 2020-06-04 DIAGNOSIS — I50810 Right heart failure, unspecified: Secondary | ICD-10-CM | POA: Diagnosis not present

## 2020-06-04 LAB — BASIC METABOLIC PANEL
Anion gap: 12 (ref 5–15)
BUN: 50 mg/dL — ABNORMAL HIGH (ref 8–23)
CO2: 35 mmol/L — ABNORMAL HIGH (ref 22–32)
Calcium: 9 mg/dL (ref 8.9–10.3)
Chloride: 81 mmol/L — ABNORMAL LOW (ref 98–111)
Creatinine, Ser: 1.7 mg/dL — ABNORMAL HIGH (ref 0.61–1.24)
GFR, Estimated: 41 mL/min — ABNORMAL LOW (ref >60)
Glucose, Bld: 117 mg/dL — ABNORMAL HIGH (ref 70–99)
Potassium: 3.6 mmol/L (ref 3.5–5.1)
Sodium: 128 mmol/L — ABNORMAL LOW (ref 135–145)

## 2020-06-04 LAB — GLUCOSE, CAPILLARY
Glucose-Capillary: 121 mg/dL — ABNORMAL HIGH (ref 70–99)
Glucose-Capillary: 191 mg/dL — ABNORMAL HIGH (ref 70–99)
Glucose-Capillary: 146 mg/dL — ABNORMAL HIGH (ref 70–99)
Glucose-Capillary: 125 mg/dL — ABNORMAL HIGH (ref 70–99)

## 2020-06-04 LAB — PROTIME-INR
INR: 2 — ABNORMAL HIGH (ref 0.8–1.2)
Prothrombin Time: 22.1 seconds — ABNORMAL HIGH (ref 11.4–15.2)

## 2020-06-04 MED ORDER — ACETAZOLAMIDE 250 MG PO TABS
250.0000 mg | ORAL_TABLET | Freq: Every day | ORAL | Status: DC
  Administered 2020-06-05 – 2020-06-07 (×3): 250 mg via ORAL
  Filled 2020-06-04 (×3): qty 1

## 2020-06-04 MED ORDER — WARFARIN SODIUM 7.5 MG PO TABS
7.5000 mg | ORAL_TABLET | Freq: Once | ORAL | Status: AC
  Administered 2020-06-04: 7.5 mg via ORAL
  Filled 2020-06-04: qty 1

## 2020-06-04 MED ORDER — FUROSEMIDE 10 MG/ML IJ SOLN
80.0000 mg | Freq: Two times a day (BID) | INTRAMUSCULAR | Status: DC
  Administered 2020-06-04 – 2020-06-06 (×4): 80 mg via INTRAVENOUS
  Filled 2020-06-04 (×4): qty 8

## 2020-06-04 MED ORDER — POTASSIUM CHLORIDE CRYS ER 20 MEQ PO TBCR
40.0000 meq | EXTENDED_RELEASE_TABLET | Freq: Once | ORAL | Status: AC
  Administered 2020-06-04: 40 meq via ORAL
  Filled 2020-06-04: qty 2

## 2020-06-04 MED ORDER — METOLAZONE 5 MG PO TABS
2.5000 mg | ORAL_TABLET | Freq: Once | ORAL | Status: AC
  Administered 2020-06-04: 2.5 mg via ORAL
  Filled 2020-06-04: qty 1

## 2020-06-04 NOTE — Progress Notes (Addendum)
Subjective:   Patient seen sitting up at bedside this morning. He is in good spirits. He had three BM yesterday and was able to use the toilet. He got up to the chair yesterday.   Objective:  Vital signs in last 24 hours: Vitals:   06/03/20 2256 06/04/20 0018 06/04/20 0316 06/04/20 0407  BP:  (!) 126/51  (!) 134/59  Pulse:  63 65 65  Resp:  17 17 19   Temp:    98.1 F (36.7 C)  TempSrc:    Axillary  SpO2: 97% 97% 98% 99%  Weight:      Height:        Physical Exam Constitutional: no acute distress, mildly hard of hearing Head: atraumatic ENT: external ears normal Eyes: EOMI Cardiovascular: normal rate, in ventricular bi- and trigeminy intermittently, normal heart sounds, no pitting edema Pulmonary: effort normal, crackles at lung bases bilaterally, breathing comfortably  Abdominal: flat, bowel sounds active. Cholecystostomy bag in place with bilious and sanguinous drainage Skin: warm and dry, RLE with small unstageable pressure ulcer to lateral aspect of 2nd toe where toenail on 3rd toe is pressing against it, small blood blister on distal aspect of 3rd toe, subungual blood blister to 5th toenail, soft area to posterior R heel with decreased blanching which is an early pressure injury Neurological: alert, no focal deficit, attention is improved today but is still somewhat delirious with poor understanding of the situation Psychiatric: normal mood and affect  Assessment/Plan: Isaiah [name] is a 80 y.o. male with hx of HFpEF (EF 55 to 60%), CAD, DM, CVA, CKD 3, HLD, OSA, A. fib on Coumadin presenting with shortness of breath.  Volume up on exam and required BiPAP, now on nasal cannula. Diuresing.  Active Problems:   AKI (acute kidney injury) (HCC)   Acute exacerbation of congestive heart failure (HCC)   Acute on chronic respiratory failure with hypoxia and hypercapnia (HCC)  Acute on chronic hypoxic and hypercapnic respiratory failure Cor pulmonale OSA Pulmonary  hypertension HFmrEF exacerbation At this point does not have increased volume on exam s/p diuresis of >30L this admission, but CXR with continued pulmonary edema. Last echo on 1/9 with LVEF 45 to 50% and severely elevated pulmonary artery pressure.  Had R heart cath on 1/20 which showed moderate pulmonary HTN and moderately elevated R and L heart filling pressures. OSA and likely OHS also contributing. Had a brief respiratory arrest on 1/9 for which was admitted to the ICU and placed on BiPAP.  Ultimately did not require intubation.  Transferred out of ICU on 1/19.  BNP down to 275 from 618 on admission, and weight down 45 pounds.  On HFNC during the day and BiPAP at night. - Heart failure team consulted, appreciate assistance     - restart IV Lasix 4mh, + metolazone with next Lasix dose     - reduce diamox to 250mg       - stop spironolactone 50mg      - Jardiance 10mg      - continue TED hose, no unna boots due to PAD - appreciate PCCM asisstance     - continue sildinafil 20mg  TID - Fluid restriction - BiPAP at night - Likely exchange home CPAP for BiPAP on discharge  Goals of care Goals of care were discussed with family on 1/18 and they want full scope of care and full code, but not prolonged life support, tracheostomy, or hemodialysis.  They understand he has a poor prognosis. - will discuss further with family  and consult palliative care  Pressure injuries Has had long hospital course. Noted to have the beginning of a pressure injury to R heel, small pressure injury to left 2nd digit from pressing against the 3rd digit toenail, and blood blisters to distal 3rd toe and subgunal of 5th toe. - prevalon boots - small dressing to R 2nd toe - continue to monitor  Delirium   AKI on CKD 3 Creatinine was 1.90 on admission, today is 1.82 < 1.65 < 1.81. Baseline around 1.3.  Likely prerenal/cardiorenal, but also in setting of diuresis. - Diuretics as above, stopping spironolactone - Daily  BMP - Avoid nephrotoxins  Contraction alkalosis Most recent bicarb of 47, thought due to contraction alkalosis from diuresis for HFmrEF. He is down 32L from admission.  - diamox 250mg  daily - daily bicarb level  Recent acute cholecystitis complicated by sepsis Recent hospitalization for acute cholecystitis and sepsis complicated by brief cardiac arrest.  Initial plan for cardiac clearance and cholecystectomy in 8 weeks, but underwent IR Coley cystotomy for severity of illness on 11/12 and had a brief cardiac arrest during this procedure.   -trend output -Need to clarify plan before discharge  DM type II Home regimen of 70/30 50 units twice daily and glipizide 5 mg twice daily.  Currently fairly controlled with SSI. -Continue SSI  HTN -home hydralazine 100 mg twice daily, Imdur 60 mg daily, and Lasix -holding BP meds for soft BPs  A fib Sick sinus syndrome Rate controlled on coumadin alone. Has had episodes of bradycardia and a 3s pause on telemry, likely sick sinus syndrome. Has been intermittently in ventricular trigeminy or bigeminy.  -Continue home Coumadin - continue telemetry  CAD PAD  had MI in 1994, CABG 1997 with LIMA-LAD and seq SVG-OD. Had PCI to proximal RCA in 2018. Last catheterization in March 2018 with patent SVG-OD, patent LIMA to LAD, 80% stenosis of RCA which was stented. -Continue home pravastatin 40 mg, Imdur 60 mg, warfarin as above -No Unna boots given PAD   Diet: heart healthy IVF: None VTE: Coumadin Prior to Admission Living Arrangement: Home Anticipated Discharge Location: SNF Barriers to Discharge: Respiratory failure Dispo: Anticipated discharge in approximately 3-5 day(s).   April 2018, MD 06/04/2020, 6:59 AM Pager: [phone] After 5pm on weekdays and 1pm on weekends: On Call pager [phone]

## 2020-06-04 NOTE — Progress Notes (Signed)
Patient ID: Isaiah [name], male   DOB: 11/21/1940, 80 y.o.   MRN: [id number]     Advanced Heart Failure Rounding Note  PCP-Cardiologist: Nanetta Batty, MD    Patient Profile:    80 y/o male w/ CAD, Afib, Stage III CKD and severe chronic hypoxic/hypercarbic respiratory failure due to OSA/OHS with baseline pCO2 ~ 80. Now with severe RV failure and massive volume overload.   Subjective:    No weight today.  He feels good but CVP back up to 12-13.  Creatinine lower at 1.7.  RHC Procedural Findings: Hemodynamics (mmHg) RA mean 9 RV 59/10 PA 60/22, mean 36 PCWP mean 16 Oxygen saturations: PA 70% AO 98% Cardiac Output (Fick) 7.8  Cardiac Index (Fick) 3.7 PVR 2.6 WU   Objective:   Weight Range: 93.5 kg Body mass index is 30.44 kg/m.   Vital Signs:   Temp:  [97.4 F (36.3 C)-98.7 F (37.1 C)] 97.4 F (36.3 C) (01/22 0817) Pulse Rate:  [63-76] 63 (01/22 0817) Resp:  [16-20] 18 (01/22 0817) BP: (106-134)/(42-59) 123/47 (01/22 0817) SpO2:  [95 %-100 %] 99 % (01/22 0817) Last BM Date: 06/03/20  Weight change: Filed Weights   05/29/20 0500 05/30/20 0500 05/31/20 1830  Weight: 103.4 kg 100.2 kg 93.5 kg    Intake/Output:   Intake/Output Summary (Last 24 hours) at 06/04/2020 1035 Last data filed at 06/04/2020 1000 Gross per 24 hour  Intake 240 ml  Output 1625 ml  Net -1385 ml      Physical Exam   General: NAD Neck: JVP 10-12, no thyromegaly or thyroid nodule.  Lungs: Clear to auscultation bilaterally with normal respiratory effort. CV: Nondisplaced PMI.  Heart regular S1/S2, no S3/S4, no murmur.  1+ ankle edema.  Abdomen: Soft, nontender, no hepatosplenomegaly, no distention.  Skin: Intact without lesions or rashes.  Neurologic: Alert and oriented x 3.  Psych: Normal affect. Extremities: No clubbing or cyanosis.  HEENT: Normal.    Telemetry    A Fib 60s (personally reviewed)  Labs    CBC Recent Labs    06/02/20 0841 06/03/20 0302  WBC  --  5.9   HGB 11.6*  11.9* 9.6*  HCT 34.0*  35.0* 30.7*  MCV  --  96.8  PLT  --  169   Basic Metabolic Panel Recent Labs    71/69/67 0302 06/04/20 0231  NA 127* 128*  K 4.7 3.6  CL 81* 81*  CO2 33* 35*  GLUCOSE 161* 117*  BUN 46* 50*  CREATININE 1.84* 1.70*  CALCIUM 8.9 9.0  MG 2.2  --    Liver Function Tests No results for input(s): AST, ALT, ALKPHOS, BILITOT, PROT, ALBUMIN in the last 72 hours. No results for input(s): LIPASE, AMYLASE in the last 72 hours. Cardiac Enzymes No results for input(s): CKTOTAL, CKMB, CKMBINDEX, TROPONINI in the last 72 hours.  BNP: BNP (last 3 results) Recent Labs    03/22/20 0117 05/20/20 1133 06/01/20 0511  BNP 441.2* 618.9* 275.2*    ProBNP (last 3 results) No results for input(s): PROBNP in the last 8760 hours.   D-Dimer No results for input(s): DDIMER in the last 72 hours. Hemoglobin A1C No results for input(s): HGBA1C in the last 72 hours. Fasting Lipid Panel No results for input(s): CHOL, HDL, LDLCALC, TRIG, CHOLHDL, LDLDIRECT in the last 72 hours. Thyroid Function Tests No results for input(s): TSH, T4TOTAL, T3FREE, THYROIDAB in the last 72 hours.  Invalid input(s): FREET3  Other results:   Imaging    No  results found.   Medications:     Scheduled Medications: . acetaZOLAMIDE  250 mg Oral Daily  . allopurinol  100 mg Oral BID  . chlorhexidine  15 mL Mouth Rinse BID  . Chlorhexidine Gluconate Cloth  6 each Topical Daily  . empagliflozin  10 mg Oral Daily  . ferrous sulfate  325 mg Oral Q breakfast  . furosemide  80 mg Intravenous BID  . gabapentin  100 mg Oral TID  . insulin aspart  0-15 Units Subcutaneous TID WC  . mouth rinse  15 mL Mouth Rinse q12n4p  . metolazone  2.5 mg Oral Once  . OLANZapine zydis  5 mg Oral QHS  . pantoprazole  40 mg Oral BID AC  . polyethylene glycol  17 g Oral BID  . pravastatin  40 mg Oral QHS  . senna  1 tablet Oral Daily  . sildenafil  20 mg Oral TID  . sodium chloride flush   10-40 mL Intracatheter Q12H  . warfarin  7.5 mg Oral ONCE-1600  . Warfarin - Pharmacist Dosing Inpatient   Does not apply q1600    Infusions:   PRN Medications: acetaminophen, bisacodyl, ondansetron (ZOFRAN) IV, sodium chloride flush   Assessment/Plan   1. Acute on chronic heart failure with mid-range EF and severe RV failure:  Echo this admission with EF 45-50%, anteroseptal HK, D-shaped septum with PASP 63 mmHg and dilated IVC.  During 11/21 admission, initial echo showed EF 20% with severe RV dysfunction.  Repeat echo later in hospitalization with EF up to 55% and mild RV dysfunction.  Thought to have stress/Takotsubo cardiomyopathy in setting of septic shock/cholecystitis. Co-ox has been ok. Has diuresed well. RHC after diuresis showed  mildly elevated filling pressures with moderate pulmonary hypertension.  PVR not particularly high due to relatively high cardiac output.  As PCWP was not markedly elevated, he was started on trial of sildenafil 20 mg tid. Diuretics changed to PO, now on torsemide 40 daily + diamox daily.  However, CVP trending up again, 12-13 currently.  - Restart Lasix 80 mg IV bid with a dose of metolazone with next IV Lasix dose.   - Continuing diamox at 250 mg daily.  - He is on Jardiance. - When he is ready to transition back to po diuretics, will need higher dose.  - no UNNA boots w/ severe PAD. Continue w/ TED hose  2. Acute on Chronic Hypoxic Respiratory Failure due to end-stage OSA/OHS baseline pCO2 85-95 range - Remains on HFNC 9L.  - Suspect truly end-stage. Need to consider tracheostomy but he refuses. 3. Atrial fibrillation: Chronic, rate is controlled. H/o CVA.  - Continue warfarin, INR has been therapeutic  4. CAD: Anterior MI 1994, CABG 1997 with LIMA-LAD and seq SVG-OM/D.  Had PCI to proximal RCA in 2018.  No chest pain. EF has generally been in the 50% range (45-50% on echo this admission). HS-TnI minimally elevated with no trend this admission, ACS is  unlikely. No chest pain.  - Continue home statin.  - He is on warfarin so no ASA.  5. PAD: H/o L=>R fem-fem crossover grafting, had to be removed due to infection and had patch angioplasty.  6. AKI on CKD stage 3: Creatinine stable at 1.7 today.  7. H/o acute cholecystitis: Has cholecystostomy tube from 11/21.  8. Contractile Alkalosis -  On diamox 9. GOC: prognosis is poor. CCM & palliative care family meeting, continue full support with goal to get him discharged to home.   Mobilize,  out of bed.   Length of Stay: 15  Marca Ancona, MD  06/04/2020, 10:35 AM  Advanced Heart Failure Team Pager [phone] (M-F; 7a - 4p)  Please contact [facility] for night-coverage after hours (4p -7a ) and weekends on [email or web address]

## 2020-06-04 NOTE — Progress Notes (Addendum)
ANTICOAGULATION CONSULT NOTE  Pharmacy Consult for warfarin Indication: atrial fibrillation  No Known Allergies  Patient Measurements: Height: 5\' 9"  (175.3 cm) Weight: 93.5 kg (206 lb 2.1 oz) IBW/kg (Calculated) : 70.7  Vital Signs: Temp: 97.4 F (36.3 C) (01/22 0817) Temp Source: Oral (01/22 0817) BP: 123/47 (01/22 0817) Pulse Rate: 63 (01/22 0817)  Labs: Recent Labs    06/02/20 0841 06/02/20 1047 06/03/20 0302 06/04/20 0231  HGB 11.6*  11.9*  --  9.6*  --   HCT 34.0*  35.0*  --  30.7*  --   PLT  --   --  169  --   LABPROT  --  23.6* 23.1* 22.1*  INR  --  2.2* 2.1* 2.0*  CREATININE  --  1.65* 1.84* 1.70*    Estimated Creatinine Clearance: 39.8 mL/min (A) (by C-G formula based on SCr of 1.7 mg/dL (H)).   Assessment: 80 year old male admitted for HF exacerbation. PTA the patient takes warfarin for afib (PTA dose of 6 mg daily)  INR this morning is therapeutic at 2.0 and has been trending down slowly. CBC is WNL, there are no signs or symptoms of bleeding. The patient's diet has remained consistent and no new interacting medications have been started.  Goal of Therapy:  INR 2-3 Monitor platelets by anticoagulation protocol: Yes   Plan:  - Give warfarin PO 7.5 mg tonight x 1 dose - Monitor a daily PT/INR and q72h CBC - Monitor for signs and symptoms of bleeding   66, PharmD, RPh  PGY-1 Pharmacy Resident 06/04/2020 9:09 AM  Please check [email or web address] for unit-specific pharmacy phone numbers.

## 2020-06-04 NOTE — Progress Notes (Signed)
Patient noted to be lethargic this afternoon, opens eyes and nods but immediately goes back to sleep.  Placed back on BiPAP at 1530hrs  and Family Medicine team notified.Patient now watching football game off and on, son at bedside and updated on care.

## 2020-06-05 DIAGNOSIS — J9622 Acute and chronic respiratory failure with hypercapnia: Secondary | ICD-10-CM | POA: Diagnosis not present

## 2020-06-05 DIAGNOSIS — J9621 Acute and chronic respiratory failure with hypoxia: Secondary | ICD-10-CM | POA: Diagnosis not present

## 2020-06-05 DIAGNOSIS — I5033 Acute on chronic diastolic (congestive) heart failure: Secondary | ICD-10-CM | POA: Diagnosis not present

## 2020-06-05 LAB — BASIC METABOLIC PANEL
Anion gap: 11 (ref 5–15)
BUN: 50 mg/dL — ABNORMAL HIGH (ref 8–23)
CO2: 37 mmol/L — ABNORMAL HIGH (ref 22–32)
Calcium: 9.1 mg/dL (ref 8.9–10.3)
Chloride: 80 mmol/L — ABNORMAL LOW (ref 98–111)
Creatinine, Ser: 1.53 mg/dL — ABNORMAL HIGH (ref 0.61–1.24)
GFR, Estimated: 46 mL/min — ABNORMAL LOW (ref >60)
Glucose, Bld: 111 mg/dL — ABNORMAL HIGH (ref 70–99)
Potassium: 3.6 mmol/L (ref 3.5–5.1)
Sodium: 128 mmol/L — ABNORMAL LOW (ref 135–145)

## 2020-06-05 LAB — CBC
HCT: 32.1 % — ABNORMAL LOW (ref 39.0–52.0)
Hemoglobin: 10 g/dL — ABNORMAL LOW (ref 13.0–17.0)
MCH: 30.4 pg (ref 26.0–34.0)
MCHC: 31.2 g/dL (ref 30.0–36.0)
MCV: 97.6 fL (ref 80.0–100.0)
Platelets: 192 10*3/uL (ref 150–400)
RBC: 3.29 MIL/uL — ABNORMAL LOW (ref 4.22–5.81)
RDW: 14.8 % (ref 11.5–15.5)
WBC: 6.1 10*3/uL (ref 4.0–10.5)
nRBC: 0 % (ref 0.0–0.2)

## 2020-06-05 LAB — PROTIME-INR
INR: 2.1 — ABNORMAL HIGH (ref 0.8–1.2)
Prothrombin Time: 22.5 seconds — ABNORMAL HIGH (ref 11.4–15.2)

## 2020-06-05 LAB — GLUCOSE, CAPILLARY
Glucose-Capillary: 114 mg/dL — ABNORMAL HIGH (ref 70–99)
Glucose-Capillary: 188 mg/dL — ABNORMAL HIGH (ref 70–99)
Glucose-Capillary: 108 mg/dL — ABNORMAL HIGH (ref 70–99)
Glucose-Capillary: 138 mg/dL — ABNORMAL HIGH (ref 70–99)

## 2020-06-05 MED ORDER — POTASSIUM CHLORIDE CRYS ER 20 MEQ PO TBCR
40.0000 meq | EXTENDED_RELEASE_TABLET | Freq: Once | ORAL | Status: AC
  Administered 2020-06-05: 40 meq via ORAL
  Filled 2020-06-05: qty 2

## 2020-06-05 MED ORDER — METOLAZONE 5 MG PO TABS
2.5000 mg | ORAL_TABLET | Freq: Once | ORAL | Status: AC
  Administered 2020-06-05: 2.5 mg via ORAL
  Filled 2020-06-05: qty 1

## 2020-06-05 MED ORDER — WARFARIN SODIUM 7.5 MG PO TABS
7.5000 mg | ORAL_TABLET | Freq: Once | ORAL | Status: AC
  Administered 2020-06-05: 7.5 mg via ORAL
  Filled 2020-06-05: qty 1

## 2020-06-05 NOTE — Progress Notes (Addendum)
Paged by RN at 2326 that patient's HR was in 30s on telemetry at 2240, but was asymptomatic. HR currently back up to 68-70. Notified RN to correlate telemetry with manual vital signs and to page me immediately should HR drop down to <55 again.

## 2020-06-05 NOTE — Progress Notes (Signed)
ANTICOAGULATION CONSULT NOTE  Pharmacy Consult for warfarin Indication: atrial fibrillation  No Known Allergies  Patient Measurements: Height: 5\' 9"  (175.3 cm) Weight: 91.4 kg (201 lb 8 oz) IBW/kg (Calculated) : 70.7  Vital Signs: Temp: 97.7 F (36.5 C) (01/23 0453) Temp Source: Axillary (01/23 0453) BP: 120/55 (01/23 0453) Pulse Rate: 78 (01/23 0453)  Labs: Recent Labs    06/02/20 0841 06/02/20 1047 06/02/20 1047 06/03/20 0302 06/04/20 0231 06/05/20 0500  HGB 11.6*  11.9*  --   --  9.6*  --  10.0*  HCT 34.0*  35.0*  --   --  30.7*  --  32.1*  PLT  --   --   --  169  --  192  LABPROT  --  23.6*   < > 23.1* 22.1* 22.5*  INR  --  2.2*   < > 2.1* 2.0* 2.1*  CREATININE  --  1.65*  --  1.84* 1.70*  --    < > = values in this interval not displayed.    Estimated Creatinine Clearance: 39.4 mL/min (A) (by C-G formula based on SCr of 1.7 mg/dL (H)).   Assessment: 80 year old male admitted for HF exacerbation. PTA the patient takes warfarin for Afib (PTA dose of 6 mg daily). Pharmacy is consulted to dose warfarin.  INR this morning is therapeutic at 2.1. CBC is WNL, there are no signs or symptoms of bleeding. The patient's diet has remained fairly consistent and no new interacting medications have been started.  Goal of Therapy:  INR 2-3 Monitor platelets by anticoagulation protocol: Yes   Plan:  - Give warfarin PO 7.5 mg tonight x 1 dose - Monitor a daily PT/INR and q72h CBC - Monitor for signs and symptoms of bleeding   66, PharmD, RPh  PGY-1 Pharmacy Resident 06/05/2020 7:54 AM  Please check [email or web address] for unit-specific pharmacy phone numbers.

## 2020-06-05 NOTE — Progress Notes (Signed)
   06/05/20 2242  Assess: MEWS Score  Temp 98 F (36.7 C)  BP 110/60  Pulse Rate 60  ECG Heart Rate (!) 38  Resp 18  Assess: MEWS Score  MEWS Temp 0  MEWS Systolic 0  MEWS Pulse 2  MEWS RR 0  MEWS LOC 0  MEWS Score 2  MEWS Score Color Yellow  Assess: if the MEWS score is Yellow or Red  Were vital signs taken at a resting state? Yes  Focused Assessment No change from prior assessment  Early Detection of Sepsis Score *See Row Information* Medium  MEWS guidelines implemented *See Row Information* Yes  Treat  MEWS Interventions Escalated (See documentation below)  Take Vital Signs  Increase Vital Sign Frequency  Yellow: Q 2hr X 2 then Q 4hr X 2, if remains yellow, continue Q 4hrs  Escalate  MEWS: Escalate Yellow: discuss with charge nurse/RN and consider discussing with provider and RRT  Notify: Charge Nurse/RN  Name of Charge Nurse/RN Notified Angie RN  Date Charge Nurse/RN Notified 06/05/20  Time Charge Nurse/RN Notified 2346  Notify: Provider  Provider Name/Title Dr.Eniola  Date Provider Notified 06/05/20  Time Provider Notified 2330  Notification Type Page  Notification Reason Other (Comment) (HR=38)  Response Other (Comment) (ORDERED TO NOTIFY HIM IF IT HAPPENS AGAIN)  Date of Provider Response 06/05/20  Time of Provider Response 2335

## 2020-06-05 NOTE — Plan of Care (Signed)
  Problem: Activity: Goal: Risk for activity intolerance will decrease Outcome: Progressing   Problem: Nutrition: Goal: Adequate nutrition will be maintained Outcome: Progressing   Problem: Elimination: Goal: Will not experience complications related to bowel motility Outcome: Progressing   

## 2020-06-05 NOTE — Progress Notes (Signed)
Subjective:   On bipap when seen today. Nurse at bedside about to remove bipap as he has been on this overnight. He required bipap yesterday as well due to increased somnolence and improved after starting.  He denies shortness of breath.   CVP 12 although this was with bipap on.   Objective:  Vital signs in last 24 hours: Vitals:   06/05/20 0030 06/05/20 0300 06/05/20 0447 06/05/20 0453  BP:    (!) 120/55  Pulse: 65 63  78  Resp: 16 18  19   Temp: 97.8 F (36.6 C)   97.7 F (36.5 C)  TempSrc: Axillary   Axillary  SpO2: 100%   97%  Weight:   91.4 kg   Height:       Constitution: NAD, chronically ill appearing HENT: AT/[place], bipap in place  Cardio: RRR, no m/r/g, no LE edema  Respiratory: CTA, no w/r/r Abdominal: NTTP, soft, non-distended MSK: moving all extremities Neuro: bipap in place, unable to assess but following commands, nodding yes or no appropriately Skin: c/d/i   Assessment/Plan:  Active Problems:   AKI (acute kidney injury) (HCC)   Acute exacerbation of congestive heart failure (HCC)   Acute on chronic respiratory failure with hypoxia and hypercapnia (HCC)  DAMEK ENDE is a 80 y.o. male with hx of HFpEF (EF 55 to 60%), CAD, DM, CVA, CKD 3, HLD, OSA, A. fib on Coumadin presenting with shortness of breath.  Volume up on exam and required BiPAP, now on nasal cannula. Diuresing.   Acute on chronic hypoxic and hypercapnic respiratory failure Cor pulmonale OSA Pulmonary hypertension HFmrEF exacerbation At this point does not have increased volume on exam s/p diuresis of >30L this admission, but CXR with continued pulmonary edema. Last echo on 1/9 with LVEF 45 to 50% and severely elevated pulmonary artery pressure.  Had R heart cath on 1/20 which showed moderate pulmonary HTN and moderately elevated R and L heart filling pressures. OSA and likely OHS also contributing. Had a brief respiratory arrest on 1/9 for which was admitted to the ICU and placed on BiPAP.   Ultimately did not require intubation.  Transferred out of ICU on 1/19.  BNP down to 275 from 618 on admission, and weight down 45 pounds.  On HFNC during the day and BiPAP at night. - Heart failure team consulted, appreciate recommendations      - continue IV Lasix 69mh, + metolazone      - cont. diamox to 250mg       - stop spironolactone 50mg  yesterday      - Jardiance 10mg      - continue TED hose, no unna boots due to PAD     - continue sildinafil 20mg  TID - Fluid restriction - BiPAP at night - will likely need to start during the day as well.  - Likely exchange home CPAP for BiPAP on discharge  Goals of care Goals of care were discussed with family on 1/18 and they want full scope of care and full code, but not prolonged life support, tracheostomy, or hemodialysis.  They understand he has a poor prognosis. - will discuss further with family and consult palliative care   Pressure injuries Has had long hospital course. Noted to have the beginning of a pressure injury to R heel, small pressure injury to left 2nd digit from pressing against the 3rd digit toenail, and blood blisters to distal 3rd toe and subgunal of 5th toe. - prevalon boots - small dressing to R 2nd  toe - continue to monitor  AKI on CKD 3 Creatinine was 1.90 on admission, today is 1.82 < 1.65 < 1.81. 1.5 today. Baseline around 1.3.  Likely prerenal/cardiorenal, but also in setting of diuresis. - Diuretics as above, stopping spironolactone - Daily BMP - Avoid nephrotoxins  Recent acute cholecystitis complicated by sepsis Recent hospitalization for acute cholecystitis and sepsis complicated by brief cardiac arrest.  Initial plan for cardiac clearance and cholecystectomy in 8 weeks, but underwent IR Coley cystotomy for severity of illness on 11/12 and had a brief cardiac arrest during this procedure.   -trend output -Need to clarify plan before discharge  DM type II Home regimen of 70/30 50 units twice daily and  glipizide 5 mg twice daily.  Currently fairly controlled with SSI. -Continue SSI  HTN -home hydralazine 100 mg twice daily, Imdur 60 mg daily, and Lasix -holding BP meds for soft BPs  A fib Sick sinus syndrome Rate controlled without meds, on coumadin. Has had episodes of bradycardia and a 3s pause on telemry, likely sick sinus syndrome. Has been intermittently in ventricular trigeminy or bigeminy.  -Continue home Coumadin - continue telemetry  CAD PAD  had MI in 1994, CABG 1997 with LIMA-LAD and seq SVG-OD. Had PCI to proximal RCA in 2018. Last catheterization in March 2018 with patent SVG-OD, patent LIMA to LAD, 80% stenosis of RCA which was stented. -Continue home pravastatin 40 mg, Imdur 60 mg, warfarin as above -No Unna boots given PAD   Diet: heart healthy IVF: None VTE: Coumadin Prior to Admission Living Arrangement: Home Anticipated Discharge Location: SNF Barriers to Discharge: Respiratory failure Dispo: Anticipated discharge in approximately pending relief from HFNC.   Seawell, Jaimie A, DO 06/05/2020, 7:18 AM Pager: [phone] After 5pm on weekdays and 1pm on weekends: On Call Pager: [phone]

## 2020-06-05 NOTE — Progress Notes (Signed)
Patient ID: Isaiah [name], male   DOB: 1941-03-02, 80 y.o.   MRN: [id number]     Advanced Heart Failure Rounding Note  PCP-Cardiologist: Nanetta Batty, MD    Patient Profile:    79 y/o male w/ CAD, Afib, Stage III CKD and severe chronic hypoxic/hypercarbic respiratory failure due to OSA/OHS with baseline pCO2 ~ 80. Now with severe RV failure and massive volume overload.   Subjective:    Restarted IV Lasix yesterday.  CVP down to 10 today.  No complaints.  Says he was up to bedside commode.   RHC Procedural Findings: Hemodynamics (mmHg) RA mean 9 RV 59/10 PA 60/22, mean 36 PCWP mean 16 Oxygen saturations: PA 70% AO 98% Cardiac Output (Fick) 7.8  Cardiac Index (Fick) 3.7 PVR 2.6 WU   Objective:   Weight Range: 91.4 kg Body mass index is 29.76 kg/m.   Vital Signs:   Temp:  [96.4 F (35.8 C)-98.2 F (36.8 C)] 97.9 F (36.6 C) (01/23 0826) Pulse Rate:  [42-81] 65 (01/23 0826) Resp:  [14-24] 19 (01/23 0826) BP: (105-129)/(48-68) 109/56 (01/23 0826) SpO2:  [95 %-100 %] 95 % (01/23 0826) FiO2 (%):  [40 %] 40 % (01/22 1554) Weight:  [90.1 kg-91.4 kg] 91.4 kg (01/23 0447) Last BM Date: 06/04/20  Weight change: Filed Weights   05/31/20 1830 06/04/20 1038 06/05/20 0447  Weight: 93.5 kg 90.1 kg 91.4 kg    Intake/Output:   Intake/Output Summary (Last 24 hours) at 06/05/2020 0942 Last data filed at 06/05/2020 0900 Gross per 24 hour  Intake 600 ml  Output 2340 ml  Net -1740 ml      Physical Exam   General: NAD Neck: JVP 10 cm, no thyromegaly or thyroid nodule.  Lungs: Clear to auscultation bilaterally with normal respiratory effort. CV: Nondisplaced PMI.  Heart regular S1/S2, no S3/S4, no murmur.  No peripheral edema.   Abdomen: Soft, nontender, no hepatosplenomegaly, no distention.  Skin: Intact without lesions or rashes.  Neurologic: Alert and oriented x 3.  Psych: Normal affect. Extremities: No clubbing or cyanosis.  HEENT: Normal.    Telemetry    A  Fib 60s (personally reviewed)  Labs    CBC Recent Labs    06/03/20 0302 06/05/20 0500  WBC 5.9 6.1  HGB 9.6* 10.0*  HCT 30.7* 32.1*  MCV 96.8 97.6  PLT 169 192   Basic Metabolic Panel Recent Labs    31/49/70 0302 06/04/20 0231 06/05/20 0824  NA 127* 128* 128*  K 4.7 3.6 3.6  CL 81* 81* 80*  CO2 33* 35* 37*  GLUCOSE 161* 117* 111*  BUN 46* 50* 50*  CREATININE 1.84* 1.70* 1.53*  CALCIUM 8.9 9.0 9.1  MG 2.2  --   --    Liver Function Tests No results for input(s): AST, ALT, ALKPHOS, BILITOT, PROT, ALBUMIN in the last 72 hours. No results for input(s): LIPASE, AMYLASE in the last 72 hours. Cardiac Enzymes No results for input(s): CKTOTAL, CKMB, CKMBINDEX, TROPONINI in the last 72 hours.  BNP: BNP (last 3 results) Recent Labs    03/22/20 0117 05/20/20 1133 06/01/20 0511  BNP 441.2* 618.9* 275.2*    ProBNP (last 3 results) No results for input(s): PROBNP in the last 8760 hours.   D-Dimer No results for input(s): DDIMER in the last 72 hours. Hemoglobin A1C No results for input(s): HGBA1C in the last 72 hours. Fasting Lipid Panel No results for input(s): CHOL, HDL, LDLCALC, TRIG, CHOLHDL, LDLDIRECT in the last 72 hours. Thyroid Function Tests  No results for input(s): TSH, T4TOTAL, T3FREE, THYROIDAB in the last 72 hours.  Invalid input(s): FREET3  Other results:   Imaging    No results found.   Medications:     Scheduled Medications: . acetaZOLAMIDE  250 mg Oral Daily  . allopurinol  100 mg Oral BID  . chlorhexidine  15 mL Mouth Rinse BID  . Chlorhexidine Gluconate Cloth  6 each Topical Daily  . empagliflozin  10 mg Oral Daily  . ferrous sulfate  325 mg Oral Q breakfast  . furosemide  80 mg Intravenous BID  . gabapentin  100 mg Oral TID  . insulin aspart  0-15 Units Subcutaneous TID WC  . mouth rinse  15 mL Mouth Rinse q12n4p  . OLANZapine zydis  5 mg Oral QHS  . pantoprazole  40 mg Oral BID AC  . polyethylene glycol  17 g Oral BID  .  pravastatin  40 mg Oral QHS  . senna  1 tablet Oral Daily  . sildenafil  20 mg Oral TID  . sodium chloride flush  10-40 mL Intracatheter Q12H  . warfarin  7.5 mg Oral ONCE-1600  . Warfarin - Pharmacist Dosing Inpatient   Does not apply q1600    Infusions:   PRN Medications: acetaminophen, bisacodyl, ondansetron (ZOFRAN) IV, sodium chloride flush   Assessment/Plan   1. Acute on chronic heart failure with mid-range EF and severe RV failure:  Echo this admission with EF 45-50%, anteroseptal HK, D-shaped septum with PASP 63 mmHg and dilated IVC.  During 11/21 admission, initial echo showed EF 20% with severe RV dysfunction.  Repeat echo later in hospitalization with EF up to 55% and mild RV dysfunction.  Thought to have stress/Takotsubo cardiomyopathy in setting of septic shock/cholecystitis. Co-ox has been ok. Has diuresed well. RHC after diuresis showed  mildly elevated filling pressures with moderate pulmonary hypertension.  PVR not particularly high due to relatively high cardiac output.  As PCWP was not markedly elevated, he was started on trial of sildenafil 20 mg tid. He was restarted on IV Lasix and metolazone yesterday with rise in CVP.   - Continue Lasix 80 mg IV bid with a dose of metolazone 2.5 for 1 more day, probably back to po torsemide at higher dose tomorrow.  - Continuing diamox at 250 mg daily.  - He is on Jardiance. - no UNNA boots w/ severe PAD. Continue w/ TED hose  2. Acute on Chronic Hypoxic Respiratory Failure due to end-stage OSA/OHS baseline pCO2 85-95 range - Now on 4L [place] during day and Bipap at night.  - Suspect truly end-stage. Need to consider tracheostomy but he refuses. 3. Atrial fibrillation: Chronic, rate is controlled. H/o CVA.  - Continue warfarin, INR has been therapeutic  4. CAD: Anterior MI 1994, CABG 1997 with LIMA-LAD and seq SVG-OM/D.  Had PCI to proximal RCA in 2018.  No chest pain. EF has generally been in the 50% range (45-50% on echo this  admission). HS-TnI minimally elevated with no trend this admission, ACS is unlikely. No chest pain.  - Continue home statin.  - He is on warfarin so no ASA.  5. PAD: H/o L=>R fem-fem crossover grafting, had to be removed due to infection and had patch angioplasty.  6. AKI on CKD stage 3: Creatinine stable at 1.5 today.  7. H/o acute cholecystitis: Has cholecystostomy tube from 11/21.  8. Contractile Alkalosis -  On diamox, probably stop tomorrow.  9. GOC: prognosis is poor. CCM & palliative care family  meeting, continue full support with goal to get him discharged to home.   Mobilize, out of bed.   Length of Stay: 47  Marca Ancona, MD  06/05/2020, 9:42 AM  Advanced Heart Failure Team Pager [phone] (M-F; 7a - 4p)  Please contact [facility] for night-coverage after hours (4p -7a ) and weekends on [email or web address]

## 2020-06-05 NOTE — Progress Notes (Signed)
Patient noted to be lethargic.  Opening eyes to stimulation, but immediately sleeping.  BP 119/56, HR 62, oxygen saturations 98% on 3L [place]. Patient placed on BiPAP.  Family medicine team notified.  Will continue to monitor.

## 2020-06-06 DIAGNOSIS — J9621 Acute and chronic respiratory failure with hypoxia: Secondary | ICD-10-CM | POA: Diagnosis not present

## 2020-06-06 DIAGNOSIS — I50810 Right heart failure, unspecified: Secondary | ICD-10-CM | POA: Diagnosis not present

## 2020-06-06 DIAGNOSIS — J9622 Acute and chronic respiratory failure with hypercapnia: Secondary | ICD-10-CM | POA: Diagnosis not present

## 2020-06-06 LAB — GLUCOSE, CAPILLARY
Glucose-Capillary: 131 mg/dL — ABNORMAL HIGH (ref 70–99)
Glucose-Capillary: 203 mg/dL — ABNORMAL HIGH (ref 70–99)
Glucose-Capillary: 157 mg/dL — ABNORMAL HIGH (ref 70–99)
Glucose-Capillary: 167 mg/dL — ABNORMAL HIGH (ref 70–99)

## 2020-06-06 LAB — BASIC METABOLIC PANEL
Anion gap: 12 (ref 5–15)
BUN: 52 mg/dL — ABNORMAL HIGH (ref 8–23)
CO2: 36 mmol/L — ABNORMAL HIGH (ref 22–32)
Calcium: 9.2 mg/dL (ref 8.9–10.3)
Chloride: 78 mmol/L — ABNORMAL LOW (ref 98–111)
Creatinine, Ser: 1.6 mg/dL — ABNORMAL HIGH (ref 0.61–1.24)
GFR, Estimated: 44 mL/min — ABNORMAL LOW (ref >60)
Glucose, Bld: 131 mg/dL — ABNORMAL HIGH (ref 70–99)
Potassium: 3.7 mmol/L (ref 3.5–5.1)
Sodium: 126 mmol/L — ABNORMAL LOW (ref 135–145)

## 2020-06-06 LAB — SODIUM: Sodium: 124 mmol/L — ABNORMAL LOW (ref 135–145)

## 2020-06-06 LAB — PROTIME-INR
INR: 2.2 — ABNORMAL HIGH (ref 0.8–1.2)
Prothrombin Time: 23.9 seconds — ABNORMAL HIGH (ref 11.4–15.2)

## 2020-06-06 MED ORDER — TORSEMIDE 20 MG PO TABS
60.0000 mg | ORAL_TABLET | Freq: Two times a day (BID) | ORAL | Status: DC
  Administered 2020-06-06 – 2020-06-07 (×2): 60 mg via ORAL
  Filled 2020-06-06 (×2): qty 3

## 2020-06-06 MED ORDER — POTASSIUM CHLORIDE CRYS ER 20 MEQ PO TBCR
40.0000 meq | EXTENDED_RELEASE_TABLET | Freq: Once | ORAL | Status: AC
  Administered 2020-06-06: 40 meq via ORAL
  Filled 2020-06-06: qty 2

## 2020-06-06 MED ORDER — TORSEMIDE 20 MG PO TABS: 40.0000 mg | ORAL_TABLET | Freq: Two times a day (BID) | ORAL | Status: DC

## 2020-06-06 MED ORDER — TORSEMIDE 20 MG PO TABS: 60.0000 mg | ORAL_TABLET | Freq: Two times a day (BID) | ORAL | Status: DC

## 2020-06-06 MED ORDER — TOLVAPTAN 15 MG PO TABS
15.0000 mg | ORAL_TABLET | Freq: Once | ORAL | Status: AC
  Administered 2020-06-06: 15 mg via ORAL
  Filled 2020-06-06: qty 1

## 2020-06-06 MED ORDER — WARFARIN SODIUM 5 MG PO TABS
6.0000 mg | ORAL_TABLET | Freq: Once | ORAL | Status: AC
  Administered 2020-06-06: 6 mg via ORAL
  Filled 2020-06-06: qty 1

## 2020-06-06 NOTE — Progress Notes (Signed)
Physical Therapy Treatment Patient Details Name: Isaiah [name] MRN: [id number] DOB: January 01, 1941 Today's Date: 06/06/2020    History of Present Illness 80 year old male presented with progressive shortness of breath and hypoxia with bilateral lower extremity swelling on admission.  Admitted per teaching service for acute CHF exacerbation and volume overload.  Morning of 05/22/2020 patient suffered brief respiratory arrest for which PCCM was consulted for further management.  s/p R heart cath 1/20. PMHx:  d CHF, DM2. (ropr CVA. afob. PSA. JTM. CAD. CLD stage 3.    PT Comments    Pt making steady progress. Able to amb short distance in hallway. Maintained SpO2 93% on 2L with activity. Spoke with daughter Lupita Leash and confirmed she plans for pt to come home and can provide needed assist. Would like a wider 3-in-1 commode for the home than what they have.    Follow Up Recommendations  Home health PT;Supervision/Assistance - 24 hour     Equipment Recommendations  Other (comment) (Daughter reports current 3-in- 1 is too narrow and needs wide 3-in-1 if possible.)    Recommendations for Other Services       Precautions / Restrictions Precautions Precautions: Fall Restrictions Weight Bearing Restrictions: No    Mobility  Bed Mobility Overal bed mobility: Needs Assistance Bed Mobility: Supine to Sit     Supine to sit: Min assist;HOB elevated     General bed mobility comments: Pt up in chair  Transfers Overall transfer level: Needs assistance Equipment used: Rolling walker (2 wheeled) Transfers: Sit to/from [employer] Sit to Stand: Mod assist Stand pivot transfers: Min assist       General transfer comment: Assist to bring hips up and for balance. Verbal cues for hand placement  Ambulation/Gait Ambulation/Gait assistance: Min assist Gait Distance (Feet): 15 Feet (x 2) Assistive device: Rolling walker (2 wheeled) Gait Pattern/deviations: Step-through  pattern;Decreased step length - right;Decreased step length - left;Trunk flexed Gait velocity: decr Gait velocity interpretation: <1.31 ft/sec, indicative of household ambulator General Gait Details: Assis for balance and support. Verbal cues to step closer to the walker   Stairs             Wheelchair Mobility    Modified Rankin (Stroke Patients Only)       Balance Overall balance assessment: Needs assistance Sitting-balance support: Feet supported;No upper extremity supported Sitting balance-Leahy Scale: Fair   Postural control: Posterior lean Standing balance support: Bilateral upper extremity supported Standing balance-Leahy Scale: Poor Standing balance comment: walker and min assist for static standing                            Cognition Arousal/Alertness: Awake/alert Behavior During Therapy: WFL for tasks assessed/performed Overall Cognitive Status: Within Functional Limits for tasks assessed                                 General Comments: Pt HOH and at times slow to follow due to this      Exercises      General Comments General comments (skin integrity, edema, etc.): SpO2 97% on 2L, 85% on RA. Amb on 2L with SpO2 93%.      Pertinent Vitals/Pain Pain Assessment: No/denies pain    Home Living                      Prior Function  PT Goals (current goals can now be found in the care plan section) Acute Rehab PT Goals Patient Stated Goal: Like to take pt home. Progress towards PT goals: Progressing toward goals    Frequency    Min 3X/week      PT Plan Current plan remains appropriate    Co-evaluation              AM-PAC PT "6 Clicks" Mobility   Outcome Measure  Help needed turning from your back to your side while in a flat bed without using bedrails?: A Little Help needed moving from lying on your back to sitting on the side of a flat bed without using bedrails?: A Little Help  needed moving to and from a bed to a chair (including a wheelchair)?: A Lot Help needed standing up from a chair using your arms (e.g., wheelchair or bedside chair)?: A Lot Help needed to walk in hospital room?: A Little Help needed climbing 3-5 steps with a railing? : A Lot 6 Click Score: 15    End of Session Equipment Utilized During Treatment: Gait belt;Oxygen Activity Tolerance: Patient tolerated treatment well Patient left: in chair;with call bell/phone within reach;with chair alarm set Nurse Communication: Mobility status PT Visit Diagnosis: Other abnormalities of gait and mobility (R26.89);Muscle weakness (generalized) (M62.81)     Time: 9892-1194 PT Time Calculation (min) (ACUTE ONLY): 33 min  Charges:  $Gait Training: 23-37 mins                     [facility] PT Acute Rehabilitation Services Pager [phone] Office [phone]    Angelina Ok [facility] 06/06/2020, 2:53 PM

## 2020-06-06 NOTE — Progress Notes (Addendum)
Patient ID: Isaiah [name], male   DOB: [birth year], 80 y.o.   MRN: [id number]     Advanced Heart Failure Rounding Note  PCP-Cardiologist: Isaiah Batty, MD    Patient Profile:    80 y/o male w/ CAD, Afib, Stage III CKD and severe chronic hypoxic/hypercarbic respiratory failure due to OSA/OHS with baseline pCO2 ~ 80. Now with severe RV failure and massive volume overload.   Subjective:    Restarted on  IV Lasix over the weekend.     Wt down 6 lb from yesterday. CVP 8   SCr 1.70>>1.53>>1.60.  K 3.7 Na 126  CO2 36  Sitting up on side of bed eating breakfast. Feels ok. No complaints today.    RHC Procedural Findings: Hemodynamics (mmHg) RA mean 9 RV 59/10 PA 60/22, mean 36 PCWP mean 16 Oxygen saturations: PA 70% AO 98% Cardiac Output (Fick) 7.8  Cardiac Index (Fick) 3.7 PVR 2.6 WU   Objective:   Weight Range: 88.5 kg Body mass index is 28.81 kg/m.   Vital Signs:   Temp:  [97.5 F (36.4 C)-98.1 F (36.7 C)] 97.6 F (36.4 C) (01/24 0453) Pulse Rate:  [60-81] 60 (01/24 0453) Resp:  [17-25] 17 (01/24 0453) BP: (109-140)/(39-60) 127/59 (01/24 0453) SpO2:  [95 %-99 %] 97 % (01/24 0453) FiO2 (%):  [30 %] 30 % (01/23 1700) Weight:  [88.5 kg] 88.5 kg (01/24 0453) Last BM Date: 06/05/20  Weight change: Filed Weights   06/04/20 1038 06/05/20 0447 06/06/20 0453  Weight: 90.1 kg 91.4 kg 88.5 kg    Intake/Output:   Intake/Output Summary (Last 24 hours) at 06/06/2020 0815 Last data filed at 06/06/2020 0600 Gross per 24 hour  Intake 480 ml  Output 1960 ml  Net -1480 ml      Physical Exam  CVP 9 General:  Chronically ill appearing male, sitting up on side of bed. No respiratory difficulty HEENT: normal Neck: supple. JVD 8 cm. Carotids 2+ bilat; no bruits. No lymphadenopathy or thyromegaly appreciated. Cor: PMI nondisplaced. Irregularly irregular rhythm and rate. No rubs, gallops or murmurs. Lungs: clear, no wheezing  Abdomen: soft, nontender, nondistended. No  hepatosplenomegaly. No bruits or masses. Good bowel sounds. Extremities: no cyanosis, clubbing, rash, edema + bilateral TED hoses + RUE PICC Neuro: alert & oriented x 3, cranial nerves grossly intact. moves all 4 extremities w/o difficulty. Affect pleasant.   Telemetry    A Fib 60s, transient 2nd degree AV block overnight (personally reviewed)  Labs    CBC Recent Labs    06/05/20 0500  WBC 6.1  HGB 10.0*  HCT 32.1*  MCV 97.6  PLT 192   Basic Metabolic Panel Recent Labs    37/04/88 0824 06/06/20 0530  NA 128* 126*  K 3.6 3.7  CL 80* 78*  CO2 37* 36*  GLUCOSE 111* 131*  BUN 50* 52*  CREATININE 1.53* 1.60*  CALCIUM 9.1 9.2   Liver Function Tests No results for input(s): AST, ALT, ALKPHOS, BILITOT, PROT, ALBUMIN in the last 72 hours. No results for input(s): LIPASE, AMYLASE in the last 72 hours. Cardiac Enzymes No results for input(s): CKTOTAL, CKMB, CKMBINDEX, TROPONINI in the last 72 hours.  BNP: BNP (last 3 results) Recent Labs    03/22/20 0117 05/20/20 1133 06/01/20 0511  BNP 441.2* 618.9* 275.2*    ProBNP (last 3 results) No results for input(s): PROBNP in the last 8760 hours.   D-Dimer No results for input(s): DDIMER in the last 72 hours. Hemoglobin A1C No results for  input(s): HGBA1C in the last 72 hours. Fasting Lipid Panel No results for input(s): CHOL, HDL, LDLCALC, TRIG, CHOLHDL, LDLDIRECT in the last 72 hours. Thyroid Function Tests No results for input(s): TSH, T4TOTAL, T3FREE, THYROIDAB in the last 72 hours.  Invalid input(s): FREET3  Other results:   Imaging    No results found.   Medications:     Scheduled Medications: . acetaZOLAMIDE  250 mg Oral Daily  . allopurinol  100 mg Oral BID  . chlorhexidine  15 mL Mouth Rinse BID  . Chlorhexidine Gluconate Cloth  6 each Topical Daily  . empagliflozin  10 mg Oral Daily  . ferrous sulfate  325 mg Oral Q breakfast  . furosemide  80 mg Intravenous BID  . gabapentin  100 mg Oral  TID  . insulin aspart  0-15 Units Subcutaneous TID WC  . mouth rinse  15 mL Mouth Rinse q12n4p  . OLANZapine zydis  5 mg Oral QHS  . pantoprazole  40 mg Oral BID AC  . polyethylene glycol  17 g Oral BID  . potassium chloride  40 mEq Oral Once  . pravastatin  40 mg Oral QHS  . senna  1 tablet Oral Daily  . sildenafil  20 mg Oral TID  . sodium chloride flush  10-40 mL Intracatheter Q12H  . Warfarin - Pharmacist Dosing Inpatient   Does not apply q1600    Infusions:   PRN Medications: acetaminophen, bisacodyl, ondansetron (ZOFRAN) IV, sodium chloride flush   Assessment/Plan   1. Acute on chronic heart failure with mid-range EF and severe RV failure:  Echo this admission with EF 45-50%, anteroseptal HK, D-shaped septum with PASP 63 mmHg and dilated IVC.  During 11/21 admission, initial echo showed EF 20% with severe RV dysfunction.  Repeat echo later in hospitalization with EF up to 55% and mild RV dysfunction.  Thought to have stress/Takotsubo cardiomyopathy in setting of septic shock/cholecystitis. Co-ox has been ok. Has diuresed well. RHC after diuresis showed  mildly elevated filling pressures with moderate pulmonary hypertension.  PVR not particularly high due to relatively high cardiac output.  As PCWP was not markedly elevated, he was started on trial of sildenafil 20 mg tid. He was restarted on IV Lasix and metolazone over the weekend with rise in CVP. Volume status improved. Wt down 6 lb. CVP 8 today  - Restart PO diuretics, Torsemide 40 mg bid  - Continuing diamox at 250 mg daily.  - He is on Jardiance. - no UNNA boots w/ severe PAD. Continue w/ TED hose  2. Acute on Chronic Hypoxic Respiratory Failure due to end-stage OSA/OHS baseline pCO2 85-95 range - Now on 4L [place] during day and Bipap at night.  - Suspect truly end-stage. Need to consider tracheostomy but he refuses. 3. Atrial fibrillation: Chronic, rate is controlled. H/o CVA.  - Continue warfarin, INR has been therapeutic   4. CAD: Anterior MI 1994, CABG 1997 with LIMA-LAD and seq SVG-OM/D.  Had PCI to proximal RCA in 2018.  No chest pain. EF has generally been in the 50% range (45-50% on echo this admission). HS-TnI minimally elevated with no trend this admission, ACS is unlikely. No chest pain.  - Continue home statin.  - He is on warfarin so no ASA.  5. PAD: H/o L=>R fem-fem crossover grafting, had to be removed due to infection and had patch angioplasty.  6. AKI on CKD stage 3: Creatinine stable at 1.6 today.  7. H/o acute cholecystitis: Has cholecystostomy tube from 11/21.  8. Contractile Alkalosis -  Continue diamox  9. GOC: prognosis is poor. CCM & palliative care family meeting, continue full support with goal to get him discharged to home.  10. Hyponatremia - Na 126 - Give Tolvaptan  - Follow BMP  11. Deconditioning - continue PT/OT, recommending SNF   Length of Stay: [address], PA-C  06/06/2020, 8:15 AM  Advanced Heart Failure Team Pager [phone] (M-F; 7a - 4p)  Please contact [facility] for night-coverage after hours (4p -7a ) and weekends on [email or web address]  Patient seen with PA, agree with the above note.   Weight down 6 more lbs.  CVP 8 today.  Says he feels ok, up to bedside commode this morning.  Remains on Bipap at night, HFNC during the day.    Asymptomatic sinus brady in 30s last night.  Currently, NSR 60s.  General: NAD Neck: No JVD, no thyromegaly or thyroid nodule.  Lungs: Clear to auscultation bilaterally with normal respiratory effort. CV: Nondisplaced PMI.  Heart regular S1/S2, no S3/S4, no murmur.  No peripheral edema.   Abdomen: Soft, nontender, no hepatosplenomegaly, no distention.  Skin: Intact without lesions or rashes.  Neurologic: Alert and oriented x 3.  Psych: Normal affect. Extremities: No clubbing or cyanosis.  HEENT: Normal.   CVP 8, down 56 lbs total.  Think we can try to switch back to po diuretics today.   - Start torsemide 60 mg bid.  -  With hyponatremia (Na 126), will give a dose of tolvaptan 15 mg x 1, will also need to po fluid restrict.   Follow bradycardia, he is not on nodal blockers.  Appears to occur only at night and likely related to OSA.   Severe OHS/OSA, continues on Bipap at night and oxygen during the day.   Continue to mobilize, looks like he will need SNF.   Isaiah [name] 06/06/2020 8:50 AM

## 2020-06-06 NOTE — Progress Notes (Signed)
Occupational Therapy Treatment Patient Details Name: Isaiah [name] MRN: [id number] DOB: December 16, 1940 Today's Date: 06/06/2020    History of present illness 80 year old male presented with progressive shortness of breath and hypoxia with bilateral lower extremity swelling on admission.  Admitted per teaching service for acute CHF exacerbation and volume overload.  Morning of 05/22/2020 patient suffered brief respiratory arrest for which PCCM was consulted for further management.  s/p R heart cath 1/20. PMHx:  d CHF, DM2. (ropr CVA. afob. PSA. JTM. CAD. CLD stage 3.   OT comments  Pt making slow but steady progress with adls and adl transfers. Pt now min assist to stand and pivot to [facility].  Pt does fatigue quickly and requires mod assist at minimum for all LE adls.  Feel pt is progressing well but will still need a great amount of support at home therefore feel SNF may be way to go unless one of his children can provide 24 hour care.     Follow Up Recommendations  SNF;Supervision/Assistance - 24 hour    Equipment Recommendations  3 in 1 bedside commode;Wheelchair (measurements OT);Wheelchair cushion (measurements OT)    Recommendations for Other Services      Precautions / Restrictions Precautions Precautions: Fall Restrictions Weight Bearing Restrictions: No       Mobility Bed Mobility Overal bed mobility: Needs Assistance Bed Mobility: Supine to Sit     Supine to sit: Min assist;HOB elevated     General bed mobility comments: Pt reaching for therapist to pull him up but when given exact instructions of where to put his hands was able to get to EOB with min assist.  Transfers Overall transfer level: Needs assistance Equipment used: Rolling walker (2 wheeled) Transfers: Sit to/from [employer] Sit to Stand: Min assist Stand pivot transfers: Min assist       General transfer comment: cues for hand placement    Balance Overall balance assessment: Needs  assistance Sitting-balance support: Feet supported;No upper extremity supported;Single extremity supported Sitting balance-Leahy Scale: Fair   Postural control: Posterior lean   Standing balance-Leahy Scale: Poor Standing balance comment: reliant on walker to remain standing safely                           ADL either performed or assessed with clinical judgement   ADL Overall ADL's : Needs assistance/impaired Eating/Feeding: Set up;Sitting   Grooming: Oral care;Wash/dry face;Wash/dry hands;Minimal assistance;Standing;Cueing for compensatory techniques Grooming Details (indicate cue type and reason): stood at sink for appx 3 minutes.  One rest to sit.             Lower Body Dressing: Moderate assistance;Sit to/from stand;Cueing for compensatory techniques Lower Body Dressing Details (indicate cue type and reason): Pt now +1 assist to stand and manage pants.  Pt continues to require assist to get to his feet to donn socks/ shoes. Toilet Transfer: Minimal Pharmacist, community Details (indicate cue type and reason): Pt required cues for hand placement and cues to hold his head up when transferring instead of looking at the floor.         Functional mobility during ADLs: Rolling walker;Cueing for safety;Minimal assistance General ADL Comments: Pt with increased ability to stand this session making LE adls and transfers easier.  Pt completed sit to stand x5 each time getting more and more independent and recalling hand placement with more independence.     Vision   Vision Assessment?: No apparent visual deficits  Perception     Praxis      Cognition Arousal/Alertness: Awake/alert Behavior During Therapy: WFL for tasks assessed/performed Overall Cognitive Status: Within Functional Limits for tasks assessed                                 General Comments: Pt appears to be more cognitively intact this am. Pt aware of where he is and why  he is here as well as date and situation. Pt answered questions about home situation accurately and was motivated to move around.        Exercises     Shoulder Instructions       General Comments Pt with multiple lines    Pertinent Vitals/ Pain       Pain Assessment: No/denies pain  Home Living                                          Prior Functioning/Environment              Frequency  Min 2X/week        Progress Toward Goals  OT Goals(current goals can now be found in the care plan section)  Progress towards OT goals: Progressing toward goals  Acute Rehab OT Goals Patient Stated Goal: Like to take pt home. OT Goal Formulation: With patient Time For Goal Achievement: 06/11/20 Potential to Achieve Goals: Good ADL Goals Pt Will Perform Grooming: with set-up;sitting Pt Will Transfer to Toilet: with min guard assist;bedside commode;stand pivot transfer Pt/caregiver will Perform Home Exercise Program: Increased strength;Both right and left upper extremity;With minimal assist Additional ADL Goal #1: Pt will increase to supervisionA for bed mobility in preparation for OOB ADL. Additional ADL Goal #2: Pt will complete x10 mins of ADL tasks with o2 >90% and 2 seated rest breaks.  Plan Discharge plan remains appropriate    Co-evaluation                 AM-PAC OT "6 Clicks" Daily Activity     Outcome Measure   Help from another person eating meals?: None Help from another person taking care of personal grooming?: None Help from another person toileting, which includes using toliet, bedpan, or urinal?: A Lot Help from another person bathing (including washing, rinsing, drying)?: A Lot Help from another person to put on and taking off regular upper body clothing?: A Little Help from another person to put on and taking off regular lower body clothing?: A Lot 6 Click Score: 17    End of Session Equipment Utilized During Treatment: Gait  belt;Rolling walker;Oxygen  OT Visit Diagnosis: Unsteadiness on feet (R26.81);Muscle weakness (generalized) (M62.81)   Activity Tolerance Patient tolerated treatment well   Patient Left in chair;with call bell/phone within reach;with chair alarm set   Nurse Communication Mobility status        Time: 1030-1054 OT Time Calculation (min): 24 min  Charges: OT General Charges $OT Visit: 1 Visit OT Treatments $Self Care/Home Management : 23-37 mins   Hope Budds 06/06/2020, 11:11 AM

## 2020-06-06 NOTE — Care Management Important Message (Signed)
Important Message  Patient Details  Name: Isaiah [name] MRN: [id number] Date of Birth: Jan 24, 1941   Medicare Important Message Given:  Yes     [name] 06/06/2020, 4:23 PM

## 2020-06-06 NOTE — Progress Notes (Addendum)
Subjective:    Patient states that he feels well this morning and does not have any particular complaints. Denies pain anywhere. Denies SOB. Ambulated in hallways with PT.  For last 2 days, has had AMS during the day which improved with BiPAP.  Objective:  Vital signs in last 24 hours: Vitals:   06/06/20 0039 06/06/20 0205 06/06/20 0410 06/06/20 0453  BP:  (!) 122/39  (!) 127/59  Pulse: 63 62 63 60  Resp:  (!) 22 (!) 25 17  Temp:  (!) 97.5 F (36.4 C)  97.6 F (36.4 C)  TempSrc:  Axillary  Axillary  SpO2: 97% 97% 98% 97%  Weight:    88.5 kg  Height:        Physical Exam Constitutional: sitting in chair comfortably, mildly hard of hearing Head: atraumatic ENT: external ears normal Eyes: EOMI Cardiovascular: normal rate, normal heart sounds, no pitting edema Pulmonary: effort normal, minimal crackles at lung bases bilaterally, breathing comfortably on nasal canula Abdominal: flat, bowel sounds active. Cholecystostomy bag in place with bilious and sanguinous drainage Skin: warm and dry Neurological: alert, no focal deficit, attention is improving but still with poor understanding of situation Psychiatric: normal mood and affect  Assessment/Plan:  Active Problems:   AKI (acute kidney injury) (HCC)   Acute exacerbation of congestive heart failure (HCC)   Acute on chronic respiratory failure with hypoxia and hypercapnia (HCC)  Isaiah [name] is a 80 y.o. male with hx of HFpEF (EF 55 to 60%), CAD, DM, CVA, CKD 3, HLD, OSA, A. fib on Coumadin presenting with shortness of breath.  Volume up on exam initially and required BiPAP, now on high flow nasal cannula. No longer with peripheral edema but CXR still demonstrated pulmonary edema, continuing to diurese.   Acute on chronic hypoxic and hypercapnic respiratory failure Cor pulmonale OSA Pulmonary hypertension HFmrEF exacerbation At this point does not have increased volume on exam s/p diuresis of >30L this admission, but  CXR with continued pulmonary edema. Last echo on 1/9 with LVEF 45 to 50% and severely elevated pulmonary artery pressure.  Had R heart cath on 1/20 which showed moderate pulmonary HTN and moderately elevated R and L heart filling pressures. OSA and likely OHS also contributing. Had a brief respiratory arrest on 1/9 for which was admitted to the ICU and placed on BiPAP.  Ultimately did not require intubation.  Transferred out of ICU on 1/19.  BNP down to 275 from 618 on admission, and weight down 45 pounds.  On HFNC during the day and BiPAP at night. - Heart failure team consulted, appreciate recommendations      - switch IV Lasix to torsemide 60mg  BID     - cont. diamox 250mg       - Jardiance 10mg      - start tolvaptan 15mg      - continue TED hose, no unna boots due to PAD - continue sildinafil 20mg  TID - Fluid restriction - BiPAP at night - will likely need to start during the day as well.  - exchange home CPAP for BiPAP on discharge  Goals of care Goals of care were discussed with family on 1/18 and they want full scope of care and full code, but not prolonged life support, tracheostomy, or hemodialysis.  They understand he has a poor prognosis.  - will discuss further with family and consult palliative care   Pressure injuries Has had long hospital course. Noted to have the beginning of a pressure injury to R  heel, small pressure injury to left 2nd digit from pressing against the 3rd digit toenail, and blood blisters to distal 3rd toe and subgunal of 5th toe. - prevalon boots - small dressing to R 2nd toe - continue to monitor  AKI on CKD 3 Creatinine was 1.90 on admission, today is 1.6 < 1.5 today. Baseline around 1.3.  Likely prerenal/cardiorenal, but also in setting of diuresis. - Diuretics as above - Daily BMP - Avoid nephrotoxins  Recent acute cholecystitis complicated by sepsis Recent hospitalization for acute cholecystitis and sepsis complicated by brief cardiac arrest.   Initial plan for cardiac clearance and cholecystectomy in 8 weeks, but underwent IR Coley cystotomy for severity of illness on 11/12 and had a brief cardiac arrest during this procedure.   -trend output -Need to clarify plan before discharge  DM type II Home regimen of 70/30 50 units twice daily and glipizide 5 mg twice daily.  Currently fairly controlled with SSI. -Continue SSI  HTN -home hydralazine 100 mg twice daily, Imdur 60 mg daily, and Lasix -holding BP meds for soft BPs  A fib Sick sinus syndrome Rate controlled without meds, on coumadin. Has had episodes of bradycardia and a 3s pause on telemry, likely sick sinus syndrome. Has been intermittently in ventricular trigeminy or bigeminy.  -Continue home Coumadin - continue telemetry  CAD PAD  had MI in 1994, CABG 1997 with LIMA-LAD and seq SVG-OD. Had PCI to proximal RCA in 2018. Last catheterization in March 2018 with patent SVG-OD, patent LIMA to LAD, 80% stenosis of RCA which was stented. -Continue home pravastatin 40 mg, Imdur 60 mg, warfarin as above -No Unna boots given PAD   Diet: heart healthy IVF: None VTE: Coumadin Prior to Admission Living Arrangement: Home Anticipated Discharge Location: SNF Barriers to Discharge: Respiratory failure Dispo: Anticipated discharge in approximately pending relief from HFNC.   Remo Lipps, MD 06/06/2020, 6:43 AM Pager: [phone] After 5pm on weekdays and 1pm on weekends: On Call Pager: [phone]

## 2020-06-06 NOTE — Progress Notes (Signed)
ANTICOAGULATION CONSULT NOTE  Pharmacy Consult for warfarin Indication: atrial fibrillation  No Known Allergies  Patient Measurements: Height: 5\' 9"  (175.3 cm) Weight: 88.5 kg (195 lb 1.6 oz) IBW/kg (Calculated) : 70.7  Vital Signs: Temp: 97.8 F (36.6 C) (01/24 0814) Temp Source: Oral (01/24 0814) BP: 127/43 (01/24 0814) Pulse Rate: 71 (01/24 0814)  Labs: Recent Labs    06/04/20 0231 06/05/20 0500 06/05/20 0824 06/06/20 0530  HGB  --  10.0*  --   --   HCT  --  32.1*  --   --   PLT  --  192  --   --   LABPROT 22.1* 22.5*  --  23.9*  INR 2.0* 2.1*  --  2.2*  CREATININE 1.70*  --  1.53* 1.60*    Estimated Creatinine Clearance: 41.2 mL/min (A) (by C-G formula based on SCr of 1.6 mg/dL (H)).   Assessment: 80 year old male admitted for HF exacerbation. PTA the patient takes warfarin for Afib (PTA dose of 6 mg daily). Pharmacy is consulted to dose warfarin.  INR this morning is therapeutic at 2.2.  Goal of Therapy:  INR 2-3 Monitor platelets by anticoagulation protocol: Yes   Plan:  -Warfarin 6mg  x1 tonight - will attempt to get back to home dosing -Daily INR   66, PharmD, BCPS, [facility] Clinical Pharmacist [phone] Please check AMION for all [facility] numbers 06/06/2020

## 2020-06-07 DIAGNOSIS — I5033 Acute on chronic diastolic (congestive) heart failure: Secondary | ICD-10-CM | POA: Diagnosis not present

## 2020-06-07 LAB — SODIUM: Sodium: 123 mmol/L — ABNORMAL LOW (ref 135–145)

## 2020-06-07 LAB — BASIC METABOLIC PANEL
Anion gap: 13 (ref 5–15)
BUN: 56 mg/dL — ABNORMAL HIGH (ref 8–23)
CO2: 34 mmol/L — ABNORMAL HIGH (ref 22–32)
Calcium: 9.1 mg/dL (ref 8.9–10.3)
Chloride: 75 mmol/L — ABNORMAL LOW (ref 98–111)
Creatinine, Ser: 1.78 mg/dL — ABNORMAL HIGH (ref 0.61–1.24)
GFR, Estimated: 38 mL/min — ABNORMAL LOW (ref >60)
Glucose, Bld: 136 mg/dL — ABNORMAL HIGH (ref 70–99)
Potassium: 3.6 mmol/L (ref 3.5–5.1)
Sodium: 122 mmol/L — ABNORMAL LOW (ref 135–145)

## 2020-06-07 LAB — GLUCOSE, CAPILLARY
Glucose-Capillary: 123 mg/dL — ABNORMAL HIGH (ref 70–99)
Glucose-Capillary: 221 mg/dL — ABNORMAL HIGH (ref 70–99)
Glucose-Capillary: 189 mg/dL — ABNORMAL HIGH (ref 70–99)
Glucose-Capillary: 160 mg/dL — ABNORMAL HIGH (ref 70–99)

## 2020-06-07 LAB — PROTIME-INR
INR: 2.3 — ABNORMAL HIGH (ref 0.8–1.2)
Prothrombin Time: 24.4 seconds — ABNORMAL HIGH (ref 11.4–15.2)

## 2020-06-07 LAB — MAGNESIUM: Magnesium: 2.2 mg/dL (ref 1.7–2.4)

## 2020-06-07 MED ORDER — POTASSIUM CHLORIDE CRYS ER 20 MEQ PO TBCR: 40.0000 meq | EXTENDED_RELEASE_TABLET | Freq: Once | ORAL | Status: DC

## 2020-06-07 MED ORDER — TORSEMIDE 20 MG PO TABS
40.0000 mg | ORAL_TABLET | Freq: Two times a day (BID) | ORAL | Status: DC
  Administered 2020-06-07 – 2020-06-08 (×2): 40 mg via ORAL
  Filled 2020-06-07 (×2): qty 2

## 2020-06-07 MED ORDER — TOLVAPTAN 15 MG PO TABS
15.0000 mg | ORAL_TABLET | Freq: Once | ORAL | Status: AC
  Administered 2020-06-07: 15 mg via ORAL
  Filled 2020-06-07: qty 1

## 2020-06-07 MED ORDER — WARFARIN SODIUM 5 MG PO TABS
6.0000 mg | ORAL_TABLET | Freq: Once | ORAL | Status: AC
  Administered 2020-06-07: 6 mg via ORAL
  Filled 2020-06-07: qty 1

## 2020-06-07 MED ORDER — POTASSIUM CHLORIDE CRYS ER 20 MEQ PO TBCR
40.0000 meq | EXTENDED_RELEASE_TABLET | Freq: Once | ORAL | Status: AC
  Administered 2020-06-07: 40 meq via ORAL
  Filled 2020-06-07: qty 2

## 2020-06-07 NOTE — Progress Notes (Signed)
Subjective:   Patient states that he feels well this morning. Had breakfast, then had a normal bowel movement, and is now lying down. Denies SOB or pain anywhere. Feels that his breathing and mobility are good, he wants to ultimately go home where he feels that he has adequate support.  Objective:  Vital signs in last 24 hours: Vitals:   06/06/20 2033 06/06/20 2348 06/07/20 0012 06/07/20 0417  BP: 128/62  (!) 110/42 (!) 114/48  Pulse: 74 66 67 60  Resp: 16 (!) 26 (!) 23 14  Temp: 98.6 F (37 C)  97.8 F (36.6 C) 97.8 F (36.6 C)  TempSrc: Oral  Axillary Axillary  SpO2: 94% 97% 97% 98%  Weight:    90.5 kg  Height:        Physical Exam Constitutional: lying in bed comfortably, mildly hard of hearing Head: atraumatic ENT: external ears normal Eyes: EOMI Cardiovascular: normal rate, normal heart sounds, no pitting edema Pulmonary: effort normal, mild crackles at lung bases L>R, breathing comfortably on nasal canula Abdominal: flat, bowel sounds active. Cholecystostomy bag in place with bilious and sanguinous drainage Skin: warm and dry Neurological: alert, no focal deficit, attention is improving but still with poor understanding of situation Psychiatric: normal mood and affect  Assessment/Plan:  Active Problems:   AKI (acute kidney injury) (HCC)   Acute exacerbation of congestive heart failure (HCC)   Acute on chronic respiratory failure with hypoxia and hypercapnia (HCC)  Isaiah [name] is a 80 y.o. male with hx of HFpEF (EF 55 to 60%), CAD, DM, CVA, CKD 3, HLD, OSA, A. fib on Coumadin presenting with shortness of breath.  Volume up on exam initially and required BiPAP, now on high flow nasal cannula. No longer with peripheral edema but CXR still demonstrated pulmonary edema, continuing to diurese.   Acute on chronic hypoxic and hypercapnic respiratory failure, improving Cor pulmonale Pulmonary hypertension OSA HFmrEF exacerbation Peripheral edema has resolved s/p  diuresis of >30L this admission, but CXR with continued pulmonary edema and crackles on lung exam. Last echo on 1/9 with LVEF 45 to 50% and severely elevated pulmonary artery pressure.  Had R heart cath on 1/20 which showed moderate pulmonary HTN and moderately elevated R and L heart filling pressures. OSA and likely OHS also contributing. Had a brief respiratory arrest on 1/9 for which was admitted to the ICU and placed on BiPAP.  Ultimately did not require intubation.  Transferred out of ICU on 1/19.  Weight down 45 pounds since admission.  On HFNC during the day and BiPAP at night, but has required BiPAP during the day at times for CO2 retention. - Heart failure team consulted, appreciate recommendations      - decrease torsemide to 40mg  BID     - Jardiance 10mg      - another dose of tolvaptan 15mg  today     - continue TED hose, no unna boots due to PAD - discontinue Diamox - continue sildinafil 20mg  TID - Fluid restriction - BiPAP at night - will likely need to start during the day as well.  - exchange home CPAP for BiPAP on discharge  Metabolic alkalosis Hyponatremia Sodium 122 < 124. Was mildly hyponatremic to 132 on admission which initially normalized, worsened after starting diuretics as above (torsemide, metolazone, diamox). Alkalosis most likely due contractions Bicarb 35 < 37 today. OHS could also be contributing.  - discontinue Diamox  A fib Bradycardia Rate controlled without meds, on coumadin. Has had episodes of bradycardia  down to 40s, appears asymptomatic. Had a 3s pause on telemetry. Likely sick sinus syndrome. Has been intermittently in ventricular trigeminy or bigeminy.  -Continue home Coumadin - continue telemetry  Goals of care Goals of care were discussed with family on 1/18 and they want full scope of care and full code, but not prolonged life support, tracheostomy, or hemodialysis.  They understand he has a poor prognosis.  - plan to go home once medically  stable  Pressure injuries Has had long hospital course. Noted to have the beginning of a pressure injury to R heel, small pressure injury to left 2nd digit from pressing against the 3rd digit toenail, and blood blisters to distal 3rd toe and subgunal of 5th toe. - prevalon boots - small dressing to R 2nd toe - continue to monitor  AKI on CKD 3 Creatinine was 1.90 on admission, today is 1.6 < 1.5 today. Baseline around 1.3.  Likely prerenal/cardiorenal, but also in setting of diuresis. - Diuretics as above - Daily BMP - Avoid nephrotoxins  Recent acute cholecystitis complicated by sepsis Recent hospitalization for acute cholecystitis and sepsis complicated by brief cardiac arrest.  Initial plan for cardiac clearance and cholecystectomy in 8 weeks, but underwent IR Coley cystotomy for severity of illness on 11/12 and had a brief cardiac arrest during this procedure.   -trend output -Need to clarify plan before discharge  DM type II Home regimen of 70/30 50 units twice daily and glipizide 5 mg twice daily.  Currently fairly controlled with SSI. -Continue SSI  HTN -home hydralazine 100 mg twice daily, Imdur 60 mg daily, and Lasix -holding BP meds for soft BPs   CAD PAD  had MI in 1994, CABG 1997 with LIMA-LAD and seq SVG-OD. Had PCI to proximal RCA in 2018. Last catheterization in March 2018 with patent SVG-OD, patent LIMA to LAD, 80% stenosis of RCA which was stented. -Continue home pravastatin 40 mg, Imdur 60 mg, warfarin as above -No Unna boots given PAD   Diet: heart healthy IVF: None VTE: Coumadin Prior to Admission Living Arrangement: Home Anticipated Discharge Location: SNF Barriers to Discharge: Respiratory failure Dispo: Anticipated discharge in approximately pending relief from HFNC.   Remo Lipps, MD 06/07/2020, 6:51 AM Pager: [phone] After 5pm on weekdays and 1pm on weekends: On Call Pager: [phone]

## 2020-06-07 NOTE — Progress Notes (Signed)
Physical Therapy Treatment Patient Details Name: Isaiah [name] MRN: [id number] DOB: [birth year] Today's Date: 06/07/2020    History of Present Illness 80 year old male presented with progressive shortness of breath and hypoxia with bilateral lower extremity swelling on admission.  Admitted per teaching service for acute CHF exacerbation and volume overload.  Morning of 05/22/2020 patient suffered brief respiratory arrest for which PCCM was consulted for further management.  s/p R heart cath 1/20. PMHx:  d CHF, DM2. (ropr CVA. afob. PSA. JTM. CAD. CLD stage 3.    PT Comments    Pt continues to make slow, steady progress. Daughter plans to have pt come home with her assistance.   Follow Up Recommendations  Home health PT;Supervision/Assistance - 24 hour     Equipment Recommendations  Other (comment) (Daughter reports current 3-in- 1 is too narrow and needs wide 3-in-1 if possible.)    Recommendations for Other Services       Precautions / Restrictions Precautions Precautions: Fall    Mobility  Bed Mobility Overal bed mobility: Needs Assistance Bed Mobility: Supine to Sit     Supine to sit: Min assist;HOB elevated     General bed mobility comments: Pt uses therapist arm to pull trunk up into sitting  Transfers Overall transfer level: Needs assistance Equipment used: Rolling walker (2 wheeled) Transfers: Sit to/from [employer] Sit to Stand: Min assist;From elevated surface         General transfer comment: Assist to bring hips up and for balance. Verbal cues for hand placement  Ambulation/Gait Ambulation/Gait assistance: Min assist Gait Distance (Feet): 60 Feet Assistive device: Rolling walker (2 wheeled) Gait Pattern/deviations: Step-through pattern;Decreased step length - right;Decreased step length - left;Trunk flexed Gait velocity: too fast at times for his balance Gait velocity interpretation: 1.31 - 2.62 ft/sec, indicative of limited community  ambulator General Gait Details: Assis for balance and support. Verbal cues to step closer to the walker, to slow down, and to look up   Stairs             Wheelchair Mobility    Modified Rankin (Stroke Patients Only)       Balance Overall balance assessment: Needs assistance Sitting-balance support: Feet supported;No upper extremity supported Sitting balance-Leahy Scale: Fair     Standing balance support: Bilateral upper extremity supported Standing balance-Leahy Scale: Poor Standing balance comment: walker and min assist for static standing                            Cognition Arousal/Alertness: Awake/alert Behavior During Therapy: WFL for tasks assessed/performed Overall Cognitive Status: Within Functional Limits for tasks assessed                                 General Comments: Pt HOH and at times slow to follow due to this      Exercises      General Comments General comments (skin integrity, edema, etc.): Pt on 1L O2 at rest. With activity SpO2 86% on 1L so incr to 2L with SpO2 not reading during gait but >90% after sitting down      Pertinent Vitals/Pain Pain Assessment: No/denies pain    Home Living                      Prior Function            PT Goals (  current goals can now be found in the care plan section) Acute Rehab PT Goals Patient Stated Goal: Like to take pt home. Progress towards PT goals: Progressing toward goals    Frequency    Min 3X/week      PT Plan Current plan remains appropriate    Co-evaluation              AM-PAC PT "6 Clicks" Mobility   Outcome Measure  Help needed turning from your back to your side while in a flat bed without using bedrails?: A Little Help needed moving from lying on your back to sitting on the side of a flat bed without using bedrails?: A Little Help needed moving to and from a bed to a chair (including a wheelchair)?: A Little Help needed standing  up from a chair using your arms (e.g., wheelchair or bedside chair)?: A Lot Help needed to walk in hospital room?: A Little Help needed climbing 3-5 steps with a railing? : A Lot 6 Click Score: 16    End of Session Equipment Utilized During Treatment: Gait belt;Oxygen Activity Tolerance: Patient tolerated treatment well Patient left: in chair;with call bell/phone within reach;with chair alarm set Nurse Communication: Mobility status PT Visit Diagnosis: Other abnormalities of gait and mobility (R26.89);Muscle weakness (generalized) (M62.81)     Time: 4098-1191 PT Time Calculation (min) (ACUTE ONLY): 19 min  Charges:  $Gait Training: 8-22 mins                     [facility] PT Acute Rehabilitation Services Pager [phone] Office [phone]    Angelina Ok [facility] 06/07/2020, 12:15 PM

## 2020-06-07 NOTE — Discharge Summary (Addendum)
Name: Isaiah [name] MRN: [id number] DOB: [birth year] 80 y.o. PCP: [place]az[place], [name], MD  Date of Admission: 05/20/2020 11:19 AM Date of Discharge: 06/10/2020 Attending Physician: Anne Shutteraines, Alexander N, MD  Discharge Diagnosis: 1. Acute on Chronic Hypoxic Respiratory Failure 2. Cor pulmonale  3. Pulmonary Hypertension  4. OSA/OHS 5. HFmrEF Exacerbation  6. Hyponatremia  7. Atrial fibrillation with slow ventricular response 8. Type II Diabetes 9. Chronic Pulmonary Effusions   Discharge Medications: Allergies as of 06/10/2020   No Known Allergies      Medication List     STOP taking these medications    furosemide 40 MG tablet Commonly known as: LASIX   hydrALAZINE 100 MG tablet Commonly known as: APRESOLINE   insulin aspart protamine- aspart (70-30) 100 UNIT/ML injection Commonly known as: NOVOLOG MIX 70/30   isosorbide mononitrate 60 MG 24 hr tablet Commonly known as: IMDUR   metolazone 2.5 MG tablet Commonly known as: ZAROXOLYN   nitroGLYCERIN 0.4 MG SL tablet Commonly known as: NITROSTAT   oxyCODONE 5 MG immediate release tablet Commonly known as: Oxy IR/ROXICODONE   PRESCRIPTION MEDICATION   QUEtiapine 100 MG tablet Commonly known as: SEROQUEL       TAKE these medications    acetaminophen 500 MG tablet Commonly known as: TYLENOL Take 500-1,000 mg by mouth every 6 (six) hours as needed for mild pain or headache.   allopurinol 100 MG tablet Commonly known as: ZYLOPRIM TAKE 2 TABLETS BY MOUTH DAILY. What changed:  how much to take when to take this   colchicine 0.6 MG tablet Take 0.6 mg by mouth daily as needed (gout attacks).   docusate sodium 100 MG capsule Commonly known as: COLACE Take 1 capsule (100 mg total) by mouth 2 (two) times daily. What changed:  when to take this reasons to take this   empagliflozin 10 MG Tabs tablet Commonly known as: JARDIANCE Take 1 tablet (10 mg total) by mouth daily. Start taking on: June 11, 2020    ferrous sulfate 325 (65 FE) MG tablet Take 1 tablet (325 mg total) by mouth 2 (two) times daily with a meal. What changed: when to take this   gabapentin 300 MG capsule Commonly known as: NEURONTIN Take 1 capsule (300 mg total) by mouth 3 (three) times daily. Take 300 mg by mouth three times a day and 600 mg at bedtime What changed:  how much to take when to take this   glipiZIDE 5 MG tablet Commonly known as: GLUCOTROL Take 1 tablet (5 mg total) by mouth daily before breakfast. What changed: when to take this   multivitamin with minerals Tabs tablet Take 1 tablet by mouth daily.   pantoprazole 40 MG tablet Commonly known as: PROTONIX Take 40 mg by mouth 2 (two) times daily before a meal.   potassium chloride 10 MEQ tablet Commonly known as: KLOR-CON Take 1 tablet (10 mEq total) by mouth daily.   pravastatin 40 MG tablet Commonly known as: PRAVACHOL Take 40 mg by mouth at bedtime.   sildenafil 20 MG tablet Commonly known as: REVATIO Take 1 tablet (20 mg total) by mouth 3 (three) times daily.   sodium chloride flush 0.9 % Soln Commonly known as: NS Instill 5 mL daily into percutaneous drain daily x 8 weeks.   torsemide 20 MG tablet Commonly known as: DEMADEX Take 2 tablets (40 mg total) by mouth daily.   warfarin 1 MG tablet Commonly known as: COUMADIN Take 1 mg by mouth daily. Taking with the  5mg  = 6mg  daily   warfarin 5 MG tablet Commonly known as: COUMADIN Take 5 mg by mouth daily with supper. Taking with the 1mg  = 6mg  daily, unless directed differently by clinic               Durable Medical Equipment  (From admission, onward)           Start     Ordered   06/10/20 0742  For home use only DME standard manual wheelchair with seat cushion  Once       Comments: Patient suffers from pulmonary hypertension which impairs their ability to perform daily activities like bathing and dressing in the home.  A walker will not resolve issue with performing  activities of daily living. A wheelchair will allow patient to safely perform daily activities. Patient can safely propel the wheelchair in the home or has a caregiver who can provide assistance. Length of need Lifetime. Accessories: elevating leg rests (ELRs), wheel locks, extensions and anti-tippers.   06/10/20 0742   06/10/20 0741  For home use only DME 3 n 1  Once        06/10/20 06/12/20   06/10/20 0739  For home use only DME oxygen  Once       Question Answer Comment  Length of Need Lifetime   Mode or (Route) Nasal cannula   Liters per Minute 2   Oxygen delivery system Gas      06/10/20 0739              Discharge Care Instructions  (From admission, onward)           Start     Ordered   06/10/20 0000  Leave dressing on - Keep it clean, dry, and intact until clinic visit        06/10/20 1613            Disposition and follow-up:   IsaiahIsaiah [name] was discharged from [facility] in Stable condition.  At the hospital follow up visit please address:  1. Acute on Chronic Hypoxic Respiratory Failure: discharged on 2L supplemental oxygen.  3. Pulmonary Hypertension: new medication of sildenafil 20mg  TID 4. OSA/OHS: discharged with Trilogy Machine  5. HFmrEF Exacerbation: new medications include torsemide 40 mg qd, jardiance 10 mg qd 6. Hyponatremia: improved with cessation of diuretics and temp addition of tolvaptam 7. Atrial fibrillation with slow ventricular response: brady ~40. Asymptomatic. Not a candidate for pacemaker 8. Type II Diabetes: Started on jardiance 10 mg qd, glipizide decreased to 5mg  9. Chronic Pulmonary Effusions: improved with diuresis 10. Cholecystostomy tube: to be replaced by IR outpatient  2.  Labs / imaging needed at time of follow-up: CMP, Mg  3.  Pending labs/ test needing follow-up: none  Follow-up Appointments:  Follow-up Information     [facility] Follow up.   Why: Trilogy Vent-discussed with [employer].  Information sent to [employer].  Contact information: [address] [place] 06/12/20 06/12/20 [phone]         [facility] Follow up on 06/15/2020.   Specialty: Cardiology Why: at 1100. Bring all medications to your appointment Contact information: [address] [place] [phone] 08/13/2020 [phone]        [facility] Follow up.   Specialty: Home Health Services Why: HHRN, HHPT, HHOT, HHAIDE Contact information: [address] [place] [phone]  Hospital Course by problem list: 1. Acute on Chronic Hypoxic Respiratory Failure 2. Cor pulmonale  3. Pulmonary Hypertension  4. OSA/OHS 5. HFmrEF Exacerbation  6. Hyponatremia  7. Atrial fibrillation with slow ventricular response 8. AKI on CKD Stage III   Isaiah [name] is a 80yo male with a PMH of TIIDM, prior CVA, MI s/p CABG 1997 and PCI 2018, Afib on coumadin, chronic pleural effusions, OSA, HLD, HTN, CKD Stage III and HFpEF who admitted 05/20/20 with acute hypoxic respiratory failure and heart failure exacerbation with pleural effusions and increased oxygen requirement from baseline. He was treated with diuresis and bipap but had worsening respiratory failure requiring ICU transfer after have brief respiratory arrest.  Heart failure was consulted and he was placed on lasix gtt with monitoring of CVP in addition to intermittent metolazone. Repeat echo showed EF 45-50% with anteroseptal hypokinesis, elevated pulmonary pressures and RV failure. During this time he required bipap at night and sometimes during the day. Goals of care discussion was held with family opting to continue full scope of care.  Right heart cath was done, which showed mildly elevated filling pressures and moderate pulmonary hypertension. He was started on sildenafil as pulmonary capillary wedge pressure was not markedly elevated.   With diuresis respiratory status improved slowly and was complicated by hyponatremia and metabolic acidosis.  Metabolic alkalosis was treated with diamox. Hyponatremia was treated with tolvaptam and cessation of metolazone and diamox, which improved from 122 to 129 prior to discharge. Renal function improved with diuresis, and he was started on spironolactone.  He initially had intermittent atrial fibrillation with bradycardia throughout admission. This transitioned to consistent atrial fibrillation with bradycardia, thought to possibly be tachy-brady. He remained asymptomatic throughout admission.  Patient has history of chronic pulmonary effusions, which were worse at admission. These improved with diuresis prior to discharge.  Patient worked with physical therapy and occupational therapy and was able to wean to ~2L supplemental oxygen. He was discharged home with family and will have 24 hour supervision. He will have home Trilogy Ventilator to use bipap every evening. New discharge medications include torsemide 40 mg qd, sildenafil 20 mg tid, jardiance 10 mg qd.    Discharge Vitals:   BP 138/67 (BP Location: Left Arm)   Pulse 67   Temp (!) 97.2 F (36.2 C) (Axillary)   Resp 12   Ht 5\' 9"  (1.753 m)   Wt 89.3 kg   SpO2 96%   BMI 29.07 kg/m   Pertinent Labs, Studies, and Procedures:   Na 122 > 126 > 129 (1/28)  Right Heart Cath 06/02/2020 1. Mildly elevated right and left heart filling pressures.  2. Moderate pulmonary hypertension, PVR not high due to relatively high cardiac output.    Echocardiogram 05/22/2020  1. Anteroseptal hypokinesis. Left ventricular ejection fraction, by  estimation, is 45 to 50%. The left ventricle has mildly decreased  function. The left ventricle demonstrates regional wall motion  abnormalities (see scoring diagram/findings for  description). Left ventricular diastolic parameters are indeterminate.  There is the interventricular septum is flattened in  systole and diastole,  consistent with right ventricular pressure and volume overload.   2. Right ventricular systolic function is normal. The right ventricular  size is normal. There is severely elevated pulmonary artery systolic  pressure.   3. Left atrial size was mildly dilated.   4. Right atrial size was mildly dilated.   5. The mitral valve is normal in structure. Trivial mitral valve  regurgitation. No evidence of  mitral stenosis.   6. The aortic valve is calcified. There is mild calcification of the  aortic valve. There is mild thickening of the aortic valve. Aortic valve  regurgitation is not visualized. Mild aortic valve sclerosis is present,  with no evidence of aortic valve  stenosis.   7. The inferior vena cava is dilated in size with <50% respiratory  variability, suggesting right atrial pressure of 15 mmHg.   CXR 06/08/2020 IMPRESSION: Improving aeration of the lung bases and decreasing small bilateral pleural effusions.     Electronically Signed   By: Duanne Guess D.O.   On: 06/08/2020 14:57  CXR 05/20/2020 IMPRESSION: Worsening CHF with bilateral effusions left greater than right.   Patchy airspace opacities in the bases bilaterally. This may be related to the underlying CHF and edema although the possibility of multifocal pneumonia deserves consideration. Correlate with pending COVID testing.  BMP Latest Ref Rng & Units 06/10/2020 06/09/2020 06/09/2020  Glucose 70 - 99 mg/dL 235(T) - 732(K)  BUN 8 - 23 mg/dL 02(R) - 42(H)  Creatinine 0.61 - 1.24 mg/dL 0.62(B) - 7.62(G)  Sodium 135 - 145 mmol/L 129(L) 128(L) 125(L)  Potassium 3.5 - 5.1 mmol/L 3.7 - 3.7  Chloride 98 - 111 mmol/L 82(L) - 80(L)  CO2 22 - 32 mmol/L 33(H) - 33(H)  Calcium 8.9 - 10.3 mg/dL 9.4 - 9.1        Electronically Signed   By: Alcide Clever M.D.   On: 05/20/2020 11:49  Discharge Instructions: Discharge Instructions     Diet - low sodium heart healthy   Complete by: As directed     Discharge instructions   Complete by: As directed    Isaiah [name], it has been a pleasure taking care of you. You were admitted for a heart failure exacerbation. You also had issues with pulmonary hypertension, obesity hypoventilation syndrome, obstructive sleep apnea. Your kidney function was impaired and has improved to some degree, but the baseline function has likely had a degree of permanent decline. You also had low sodium which has improved, it will be important to avoid drinking too much water. There are several medication changes due to these many changes. Here are your discharge instructions.   1. Use your home Trilogy machine every night, and use 2-3L of oxygen by nasal canula during the day 2. Drink no more than 1L of water daily.  3. Switch Lasix to Torsemide, 40mg  daily. This is a different water pill. 4. Stop metolazone. 5. Your blood sugar is much better, and I am stopping your insulin at this time. We are starting Jardiance which is for both diabetes and heart failure. The pharmacy is providing a card for a 14 day supply. Decrease your glipizide to once a day. 6. Stop your blood pressure medications (hydralazine, Imdur). Your PCP may restart these if needed 7. Stop quetiapine. This was not needed during this hospital stay and may cause confusion in older patients. Your PCP may restart this if you are having a hard time 8. Decrease gabapentin to 300mg , three times a day. We need to decrease the dose because of your worsened kidney function. 9. Start sildenafil 20mg  three times a day. This will help with your pulmonary hypertension. 10. STOP nitroglycerin. This can interact with his sildenafil  11. Follow up with your cardiologist 12. Follow up with your PCP   Increase activity slowly   Complete by: As directed    Leave dressing on - Keep it clean, dry, and intact until  clinic visit   Complete by: As directed        Signed: Remo Lippshen, Jett Kulzer Y, MD 06/10/2020, 4:13 PM   Pager:  [phone]

## 2020-06-07 NOTE — Progress Notes (Addendum)
Patient ID: Isaiah [name], male   DOB: 1941/02/18, 80 y.o.   MRN: [id number]     Advanced Heart Failure Rounding Note  PCP-Cardiologist: Nanetta Batty, MD    Patient Profile:    80 y/o male w/ CAD, Afib, Stage III CKD and severe chronic hypoxic/hypercarbic respiratory failure due to OSA/OHS with baseline pCO2 ~ 80. Now with severe RV failure and massive volume overload.   Subjective:   Yesterday switched to torsemide and given tolvaptan. Weight up 4 pounds (bed weight).   NA 124>122  Feeling ok. Denies SOB.   RHC Procedural Findings: Hemodynamics (mmHg) RA mean 9 RV 59/10 PA 60/22, mean 36 PCWP mean 16 Oxygen saturations: PA 70% AO 98% Cardiac Output (Fick) 7.8  Cardiac Index (Fick) 3.7 PVR 2.6 WU   Objective:   Weight Range: 90.5 kg Body mass index is 29.46 kg/m.   Vital Signs:   Temp:  [97.8 F (36.6 C)-98.6 F (37 C)] 97.8 F (36.6 C) (01/25 0417) Pulse Rate:  [60-74] 60 (01/25 0417) Resp:  [14-26] 14 (01/25 0417) BP: (103-128)/(42-68) 114/48 (01/25 0417) SpO2:  [92 %-98 %] 98 % (01/25 0417) FiO2 (%):  [30 %] 30 % (01/24 2348) Weight:  [90.5 kg] 90.5 kg (01/25 0417) Last BM Date: 06/06/20  Weight change: Filed Weights   06/05/20 0447 06/06/20 0453 06/07/20 0417  Weight: 91.4 kg 88.5 kg 90.5 kg    Intake/Output:   Intake/Output Summary (Last 24 hours) at 06/07/2020 0744 Last data filed at 06/07/2020 0600 Gross per 24 hour  Intake -  Output 2380 ml  Net -2380 ml      Physical Exam  CVP 7-8  General:  Sitting on the side of the bed.  No resp difficulty HEENT: normal Neck: supple. no JVD. Carotids 2+ bilat; no bruits. No lymphadenopathy or thryomegaly appreciated. Cor: PMI nondisplaced. Irregular rate & rhythm. No rubs, gallops or murmurs. Lungs: Decreased on left. On 2 liters [place]>  Abdomen: soft, nontender, nondistended. No hepatosplenomegaly. No bruits or masses. Good bowel sounds. R cholecystostomy tube Extremities: no cyanosis, clubbing,  rash, edema. RUE PICC  Neuro: alert & orientedx3, cranial nerves grossly intact. moves all 4 extremities w/o difficulty. Affect pleasant    Telemetry  Afib with frequent PVCs. Episode bradycardia during the night.   Labs    CBC Recent Labs    06/05/20 0500  WBC 6.1  HGB 10.0*  HCT 32.1*  MCV 97.6  PLT 192   Basic Metabolic Panel Recent Labs    28/36/62 0530 06/06/20 1300 06/07/20 0543  NA 126* 124* 122*  K 3.7  --  3.6  CL 78*  --  75*  CO2 36*  --  34*  GLUCOSE 131*  --  136*  BUN 52*  --  56*  CREATININE 1.60*  --  1.78*  CALCIUM 9.2  --  9.1  MG  --   --  2.2   Liver Function Tests No results for input(s): AST, ALT, ALKPHOS, BILITOT, PROT, ALBUMIN in the last 72 hours. No results for input(s): LIPASE, AMYLASE in the last 72 hours. Cardiac Enzymes No results for input(s): CKTOTAL, CKMB, CKMBINDEX, TROPONINI in the last 72 hours.  BNP: BNP (last 3 results) Recent Labs    03/22/20 0117 05/20/20 1133 06/01/20 0511  BNP 441.2* 618.9* 275.2*    ProBNP (last 3 results) No results for input(s): PROBNP in the last 8760 hours.   D-Dimer No results for input(s): DDIMER in the last 72 hours. Hemoglobin A1C No  results for input(s): HGBA1C in the last 72 hours. Fasting Lipid Panel No results for input(s): CHOL, HDL, LDLCALC, TRIG, CHOLHDL, LDLDIRECT in the last 72 hours. Thyroid Function Tests No results for input(s): TSH, T4TOTAL, T3FREE, THYROIDAB in the last 72 hours.  Invalid input(s): FREET3  Other results:   Imaging    No results found.   Medications:     Scheduled Medications: . acetaZOLAMIDE  250 mg Oral Daily  . allopurinol  100 mg Oral BID  . chlorhexidine  15 mL Mouth Rinse BID  . Chlorhexidine Gluconate Cloth  6 each Topical Daily  . empagliflozin  10 mg Oral Daily  . ferrous sulfate  325 mg Oral Q breakfast  . gabapentin  100 mg Oral TID  . insulin aspart  0-15 Units Subcutaneous TID WC  . mouth rinse  15 mL Mouth Rinse q12n4p   . OLANZapine zydis  5 mg Oral QHS  . pantoprazole  40 mg Oral BID AC  . polyethylene glycol  17 g Oral BID  . potassium chloride  40 mEq Oral Once  . pravastatin  40 mg Oral QHS  . senna  1 tablet Oral Daily  . sildenafil  20 mg Oral TID  . sodium chloride flush  10-40 mL Intracatheter Q12H  . torsemide  60 mg Oral BID  . Warfarin - Pharmacist Dosing Inpatient   Does not apply q1600    Infusions:   PRN Medications: acetaminophen, bisacodyl, ondansetron (ZOFRAN) IV, sodium chloride flush   Assessment/Plan   1. Acute on chronic heart failure with mid-range EF and severe RV failure:  Echo this admission with EF 45-50%, anteroseptal HK, D-shaped septum with PASP 63 mmHg and dilated IVC.  During 11/21 admission, initial echo showed EF 20% with severe RV dysfunction.  Repeat echo later in hospitalization with EF up to 55% and mild RV dysfunction.  Thought to have stress/Takotsubo cardiomyopathy in setting of septic shock/cholecystitis. Co-ox has been ok. Has diuresed well. RHC after diuresis showed  mildly elevated filling pressures with moderate pulmonary hypertension.  PVR not particularly high due to relatively high cardiac output.  As PCWP was not markedly elevated, he was started on trial of sildenafil 20 mg tid. He was restarted on IV Lasix and metolazone over the weekend with rise in CVP.  - CVP 7-8 today. Cut back torsemide to 40 mg twice a day. Renal function trending up.    - Continuing diamox at 250 mg daily.  - He is on Jardiance. - no UNNA boots w/ severe PAD. Continue w/ TED hose  2. Acute on Chronic Hypoxic Respiratory Failure due to end-stage OSA/OHS baseline pCO2 85-95 range. He does not have CPAP or Bipap at home. - Now on 2  during day and Bipap at night.  - Suspect truly end-stage. Need to consider tracheostomy but he refuses. 3. Atrial fibrillation: Chronic, rate controlled. H/o CVA.  - Continue warfarin, INR has been therapeutic  4. CAD: Anterior MI 1994, CABG 1997  with LIMA-LAD and seq SVG-OM/D.  Had PCI to proximal RCA in 2018.  No chest pain. EF has generally been in the 50% range (45-50% on echo this admission). HS-TnI minimally elevated with no trend this admission, ACS is unlikely. No chest pain.  - Continue home statin.  - He is on warfarin so no ASA.  5. PAD: H/o L=>R fem-fem crossover grafting, had to be removed due to infection and had patch angioplasty.  6. AKI on CKD stage 3: Creatinine trending up. 1.6>1.8  7. H/o acute cholecystitis: Has cholecystostomy tube from 11/21.  8. Contractile Alkalosis -  Continue diamox  9. GOC: prognosis is poor. CCM & palliative care family meeting, continue full support with goal to get him discharged to home.  10. Hyponatremia - Na 122 - Give another dose of tolvaptan  - Limit free water  11. Deconditioning - continue PT/OT, recommending SNF   Length of Stay: 18  Amy Clegg, NP  06/07/2020, 7:44 AM  Advanced Heart Failure Team Pager [phone] (M-F; 7a - 4p)  Please contact [facility] for night-coverage after hours (4p -7a ) and weekends on [email or web address]  Patient seen with NP, agree with the above note.  Oxygen down to 1 L [place], using Bipap overnight.  CVP down to 7.  Creatinine higher at 1.78.  Sodium down to 122.   He is alert, conversational.   General: NAD Neck: No JVD, no thyromegaly or thyroid nodule.  Lungs: Distant BS CV: Nondisplaced PMI.  Heart regular S1/S2, no S3/S4, no murmur.  No peripheral edema.   Abdomen: Soft, nontender, no hepatosplenomegaly, no distention.  Skin: Intact without lesions or rashes.  Neurologic: Alert and oriented x 3.  Psych: Normal affect. Extremities: No clubbing or cyanosis.  HEENT: Normal.   Agree with decreasing torsemide to 40 mg bid with CVP 7 and rise in creatinine.    Need sodium correction.  Fluid restrict and would give a dose of tolvaptan 15 mg again today.    Continue to work with PT, will need SNF.   Marca Ancona 06/07/2020 8:36 AM

## 2020-06-07 NOTE — Progress Notes (Signed)
ANTICOAGULATION CONSULT NOTE  Pharmacy Consult for warfarin Indication: atrial fibrillation  No Known Allergies  Patient Measurements: Height: 5\' 9"  (175.3 cm) Weight: 90.5 kg (199 lb 8.3 oz) IBW/kg (Calculated) : 70.7  Vital Signs: Temp: 97.5 F (36.4 C) (01/25 0700) Temp Source: Axillary (01/25 0700) BP: 108/25 (01/25 0700) Pulse Rate: 73 (01/25 0700)  Labs: Recent Labs    06/05/20 0500 06/05/20 0824 06/06/20 0530 06/07/20 0543  HGB 10.0*  --   --   --   HCT 32.1*  --   --   --   PLT 192  --   --   --   LABPROT 22.5*  --  23.9* 24.4*  INR 2.1*  --  2.2* 2.3*  CREATININE  --  1.53* 1.60* 1.78*    Estimated Creatinine Clearance: 37.4 mL/min (A) (by C-G formula based on SCr of 1.78 mg/dL (H)).   Assessment: 80 year old male admitted for HF exacerbation. PTA the patient takes warfarin for Afib (PTA dose of 6 mg daily). Pharmacy is consulted to dose warfarin.  INR this morning is therapeutic at 2.3. CBC stable on last check.   Goal of Therapy:  INR 2-3 Monitor platelets by anticoagulation protocol: Yes   Plan:  -Warfarin 6mg  x1 tonight (PTA regimen) -Daily INR  66, PharmD, BCCCP Clinical Pharmacist  Phone: [phone] 06/07/2020 9:31 AM  Please check AMION for all [facility] Pharmacy phone numbers After 10:00 PM, call Main Pharmacy [phone]

## 2020-06-08 ENCOUNTER — Inpatient Hospital Stay (HOSPITAL_COMMUNITY): Payer: Medicare Other

## 2020-06-08 DIAGNOSIS — E871 Hypo-osmolality and hyponatremia: Secondary | ICD-10-CM

## 2020-06-08 DIAGNOSIS — I50810 Right heart failure, unspecified: Secondary | ICD-10-CM | POA: Diagnosis not present

## 2020-06-08 LAB — BASIC METABOLIC PANEL
Anion gap: 12 (ref 5–15)
BUN: 56 mg/dL — ABNORMAL HIGH (ref 8–23)
CO2: 34 mmol/L — ABNORMAL HIGH (ref 22–32)
Calcium: 9 mg/dL (ref 8.9–10.3)
Chloride: 76 mmol/L — ABNORMAL LOW (ref 98–111)
Creatinine, Ser: 1.62 mg/dL — ABNORMAL HIGH (ref 0.61–1.24)
GFR, Estimated: 43 mL/min — ABNORMAL LOW (ref >60)
Glucose, Bld: 146 mg/dL — ABNORMAL HIGH (ref 70–99)
Potassium: 3.9 mmol/L (ref 3.5–5.1)
Sodium: 122 mmol/L — ABNORMAL LOW (ref 135–145)

## 2020-06-08 LAB — GLUCOSE, CAPILLARY
Glucose-Capillary: 141 mg/dL — ABNORMAL HIGH (ref 70–99)
Glucose-Capillary: 220 mg/dL — ABNORMAL HIGH (ref 70–99)
Glucose-Capillary: 177 mg/dL — ABNORMAL HIGH (ref 70–99)
Glucose-Capillary: 166 mg/dL — ABNORMAL HIGH (ref 70–99)

## 2020-06-08 LAB — CBC
HCT: 30.6 % — ABNORMAL LOW (ref 39.0–52.0)
Hemoglobin: 9.9 g/dL — ABNORMAL LOW (ref 13.0–17.0)
MCH: 30.2 pg (ref 26.0–34.0)
MCHC: 32.4 g/dL (ref 30.0–36.0)
MCV: 93.3 fL (ref 80.0–100.0)
Platelets: 225 10*3/uL (ref 150–400)
RBC: 3.28 MIL/uL — ABNORMAL LOW (ref 4.22–5.81)
RDW: 14.4 % (ref 11.5–15.5)
WBC: 5.5 10*3/uL (ref 4.0–10.5)
nRBC: 0 % (ref 0.0–0.2)

## 2020-06-08 LAB — PROTIME-INR
INR: 2.3 — ABNORMAL HIGH (ref 0.8–1.2)
Prothrombin Time: 24.2 seconds — ABNORMAL HIGH (ref 11.4–15.2)

## 2020-06-08 LAB — OSMOLALITY: Osmolality: 286 mOsm/kg (ref 275–295)

## 2020-06-08 LAB — SODIUM, URINE, RANDOM: Sodium, Ur: 30 mmol/L

## 2020-06-08 LAB — OSMOLALITY, URINE: Osmolality, Ur: 206 mOsm/kg — ABNORMAL LOW (ref 300–900)

## 2020-06-08 LAB — SODIUM: Sodium: 122 mmol/L — ABNORMAL LOW (ref 135–145)

## 2020-06-08 MED ORDER — TOLVAPTAN 15 MG PO TABS
30.0000 mg | ORAL_TABLET | Freq: Once | ORAL | Status: AC
  Administered 2020-06-08: 30 mg via ORAL
  Filled 2020-06-08: qty 2

## 2020-06-08 MED ORDER — WARFARIN SODIUM 5 MG PO TABS
6.0000 mg | ORAL_TABLET | Freq: Once | ORAL | Status: AC
  Administered 2020-06-08: 6 mg via ORAL
  Filled 2020-06-08: qty 1

## 2020-06-08 MED ORDER — POTASSIUM CHLORIDE CRYS ER 20 MEQ PO TBCR
20.0000 meq | EXTENDED_RELEASE_TABLET | Freq: Once | ORAL | Status: AC
  Administered 2020-06-08: 20 meq via ORAL
  Filled 2020-06-08: qty 1

## 2020-06-08 NOTE — TOC Initial Note (Signed)
Transition of Care [facility]) - Initial/Assessment Note    Patient Details  Name: Isaiah [name] MRN: [id number] Date of Birth: 26-May-1940  Transition of Care [facility]) CM/SW Contact:    Gala Lewandowsky, RN Phone Number: 06/08/2020, 6:03 PM  Clinical Narrative:  Risk for readmission assessment completed. Prior to arrival patient was from home with support of family. Patient will return to daughters home- address correct in Epic. Daughter wants home health arranged with [facility]- it was after hours and Case Manager was unable to arrange- covering Case Manager please call referral. Patient will benefit from [facility] RN, PT, OT, [employer]. Pt has durable medical equipment (DME) oxygen, cpap, wheelchair, and rolling walker in the home. Daughter states that the patient will need a Trilogy vent for home. Message sent to [employer] after hours and Ian Malkin will start the process for Trilogy on 06-09-20. The transition of care team will follow for additional needs.    Expected Discharge Plan: Home w Home Health Services Barriers to Discharge: Continued Medical Work up   Patient Goals and CMS Choice Patient states their goals for this hospitalization and ongoing recovery are:: Daughter wants patient to return home      Expected Discharge Plan and Services Expected Discharge Plan: Home w Home Health Services In-house Referral: NA Discharge Planning Services: CM Consult Post Acute Care Choice: Home Health Living arrangements for the past 2 months: Single Family Home                 DME Arranged: Other see comment (trilogy vent) DME Agency: [employer] Date DME Agency Contacted: 06/08/20 Time DME Agency Contacted: [phone] Representative spoke with at DME Agency: Ian Malkin HH Arranged: RN,Disease Management,PT,OT,Nurse's Aide HH Agency:  (After hours-family wants to use [facility])        Prior Living Arrangements/Services Living arrangements for the past 2 months:  Single Family Home Lives with:: Self,Adult Children (lives with daughter in [place]) Patient language and need for interpreter reviewed:: Yes        Need for Family Participation in Patient Care: Yes (Comment) Care giver support system in place?: Yes (comment) Current home services: DME (Pt has oxygen and CPAP in the home) Criminal Activity/Legal Involvement Pertinent to Current Situation/Hospitalization: No - Comment as needed  Activities of Daily Living Home Assistive Devices/Equipment: Walker (specify type) (front wheel) ADL Screening (condition at time of admission) Patient's cognitive ability adequate to safely complete daily activities?: Yes Is the patient deaf or have difficulty hearing?: Yes Does the patient have difficulty seeing, even when wearing glasses/contacts?: No Does the patient have difficulty concentrating, remembering, or making decisions?: No Patient able to express need for assistance with ADLs?: Yes Does the patient have difficulty dressing or bathing?: No Independently performs ADLs?: No Communication: Independent Dressing (OT): Needs assistance Is this a change from baseline?: Pre-admission baseline Grooming: Needs assistance Is this a change from baseline?: Pre-admission baseline Feeding: Independent Bathing: Needs assistance Is this a change from baseline?: Pre-admission baseline Toileting: Independent with device (comment) In/Out Bed: Independent with device (comment) Walks in Home: Independent with device (comment) Does the patient have difficulty walking or climbing stairs?: Yes Weakness of Legs: Both Weakness of Arms/Hands: Both  Permission Sought/Granted Permission sought to share information with : Family Environmental health practitioner Permission granted to share information with : Yes, Verbal Permission Granted     Permission granted to share info w AGENCY: [employer]        Emotional Assessment  Attitude/Demeanor/Rapport: Engaged Affect (typically observed): Appropriate Orientation: : Oriented to Situation,Oriented to  Time,Oriented to Place,Oriented to Self Alcohol / Substance Use: Not Applicable Psych Involvement: No (comment)  Admission diagnosis:  Acute exacerbation of congestive heart failure (HCC) [I50.9] Acute systolic congestive heart failure (HCC) [I50.21] AKI (acute kidney injury) (HCC) [N17.9] Patient Active Problem List   Diagnosis Date Noted  . Acute on chronic respiratory failure with hypoxia and hypercapnia (HCC)   . Acute exacerbation of congestive heart failure (HCC) 05/20/2020  . Biventricular failure (HCC)   . Anemia due to GI blood loss   . Acute systolic heart failure (HCC)   . Shock (HCC)   . Cardiac arrest (HCC)   . Sepsis due to undetermined organism (HCC) 03/14/2020  . Acute cholecystitis 03/14/2020  . Class 1 obesity due to excess calories with body mass index (BMI) of 33.0 to 33.9 in adult 03/14/2020  . Pressure injury of skin 12/15/2018  . Thoracic spine fracture (HCC) 12/12/2018  . Hyponatremia 08/22/2016  . AKI (acute kidney injury) (HCC) 08/21/2016  . Hypokalemia 08/11/2016  . Occult GI bleeding 08/11/2016  . General weakness 08/11/2016  . GI bleed 08/11/2016  . Weakness   . Non-ST elevation (NSTEMI) myocardial infarction (HCC) 08/08/2016  . Unstable angina (HCC) 08/06/2016  . Chest pain 08/05/2016  . CKD (chronic kidney disease), stage III (HCC) 08/05/2016  . Normochromic normocytic anemia 08/05/2016  . Pain in joint, lower leg 10/01/2014  . Coronary artery disease 04/27/2014  . Chronic atrial fibrillation (HCC) 04/27/2014  . Essential hypertension 04/27/2014  . Hyperlipidemia 04/27/2014  . Type 2 diabetes mellitus with vascular disease (HCC) 04/27/2014  . Obstructive sleep apnea 04/27/2014  . Stroke (HCC) 04/27/2014  . On continuous oral anticoagulation 04/27/2014  . Occlusion and stenosis of carotid artery without mention of  cerebral infarction 02/04/2013  . Aftercare following surgery of the circulatory system, NEC 01/09/2012  . Chronic total occlusion of artery of the extremities (HCC) 01/09/2012   PCP:  [place], [name], MD Pharmacy:   [facility] [phone] - [place], [place] - [address] AT [facility] [address] [place] [address] Phone: [phone] Fax: [phone]  Readmission Risk Interventions Readmission Risk Prevention Plan 06/08/2020  Transportation Screening Complete  Medication Review (RN Care Manager) Complete  HRI or Home Care Consult Complete  SW Recovery Care/Counseling Consult Complete  Palliative Care Screening Not Applicable  Skilled Nursing Facility Not Applicable  Some recent data might be hidden

## 2020-06-08 NOTE — Progress Notes (Signed)
Subjective:   Patient states that he feels well this morning, no particular complaints.  States that he got up with physical therapy yesterday and had no dizziness while walking.  Denies shortness of breath.  Denies any pain.  Objective:  Vital signs in last 24 hours: Vitals:   06/07/20 2242 06/08/20 0003 06/08/20 0400 06/08/20 0500  BP:  (!) 123/102  92/63  Pulse: (!) 40 (!) 40  (!) 42  Resp: 18 15  14   Temp:  97.9 F (36.6 C)  97.8 F (36.6 C)  TempSrc:  Axillary  Axillary  SpO2: 100% 100%  100%  Weight:   90.7 kg   Height:        Physical Exam Constitutional: Sitting in chair comfortably, mildly hard of hearing Head: atraumatic ENT: external ears normal Eyes: EOMI Cardiovascular: normal rate, normal heart sounds, no pitting edema Pulmonary: effort normal, mild crackles at lung bases L>R, breathing comfortably on nasal canula Abdominal: flat, bowel sounds active. Cholecystostomy bag in place with minimal bilious and sanguinous drainage Skin: warm and dry Neurological: alert, no focal deficit, attention is improving but still with poor understanding of situation Psychiatric: normal mood and affect  Assessment/Plan:  Active Problems:   AKI (acute kidney injury) (HCC)   Acute exacerbation of congestive heart failure (HCC)   Acute on chronic respiratory failure with hypoxia and hypercapnia (HCC)  Isaiah [name] is a 80 y.o. male with hx of HFpEF (EF 55 to 60%), CAD, DM, CVA, CKD 3, HLD, OSA, A. fib on Coumadin presenting with shortness of breath.  Volume up on exam initially and required BiPAP, now on high flow nasal cannula. No longer with peripheral edema but CXR still demonstrated pulmonary edema, continuing to diurese.   Acute on chronic hypoxic and hypercapnic respiratory failure, improving Cor pulmonale Pulmonary hypertension OSA HFmrEF exacerbation Currently requires 1-3L of [place] during the day, and BIPAP at night. Peripheral edema has resolved s/p diuresis of  >30L this admission, but CXR with continued pulmonary edema and crackles on lung exam. Last echo on 1/9 with LVEF 45 to 50% and severely elevated pulmonary artery pressure.  Had R heart cath on 1/20 which showed moderate pulmonary HTN and moderately elevated R and L heart filling pressures. OSA and likely OHS also contributing. Had a brief respiratory arrest on 1/9 for which was admitted to the ICU and placed on BiPAP.  Ultimately did not require intubation. At times has required BiPAP during the day at times for CO2 retention. - Heart failure team consulted, appreciate recommendations      - decrease torsemide to 40mg  BID     - Jardiance 10mg      - another dose of tolvaptan 15mg  today     - continue TED hose, no unna boots due to PAD - discontinue Diamox - continue sildinafil 20mg  TID - Fluid restriction - BiPAP at night - will likely need to start during the day as well.  - exchange home CPAP for BiPAP on discharge  Metabolic alkalosis Hyponatremia Sodium stable 122 < 123 < 122 < 124. Was mildly hyponatremic to 132 on admission which initially normalized, worsened after starting diuretics as above (torsemide, metolazone, diamox). Alkalosis most likely due contractions Bicarb 35 < 37 today. OHS could also be contributing.  - discontinue Diamox - tolvaptan 15mg  daily, per heart failure  - f/u serum osmolality, urine osmolality, urine sodium  A fib Bradycardia Rate controlled without meds, on coumadin. Over past several days had intermittent episodes of bradycardia,  but now is persistently in 40s. Asymptomatic, walked with PT and denies dizziness. Likely not a candidate for pacemaker. -Continue home Coumadin - continue telemetry  Goals of care Goals of care were discussed with family on 1/18 and they want full scope of care and full code, but not prolonged life support, tracheostomy, or hemodialysis.  They understand he has a poor prognosis.  - plan to go home once medically  stable  Pressure injuries Has had long hospital course. Noted to have the beginning of a pressure injury to R heel, small pressure injury to left 2nd digit from pressing against the 3rd digit toenail, and blood blisters to distal 3rd toe and subgunal of 5th toe. - prevalon boots - small dressing to R 2nd toe - continue to monitor  AKI on CKD 3 vs progression of CKD Creatinine was 1.90 on admission, today is 1.62 < 1.78 < 1.6. This may be his new baseline. Prior baseline around 1.3.  Likely prerenal/cardiorenal, but also in setting of diuresis. - Diuretics as above - Daily BMP - Avoid nephrotoxins  Recent acute cholecystitis complicated by sepsis Recent hospitalization for acute cholecystitis and sepsis complicated by brief cardiac arrest.  Initial plan for cardiac clearance and cholecystectomy in 8 weeks, but underwent IR Coley cystotomy for severity of illness on 11/12 and had a brief cardiac arrest during this procedure.   -trend output -Need to clarify plan before discharge   DM type II Home regimen of 70/30 50 units twice daily and glipizide 5 mg twice daily.  Now requiring <10 units daily. -Continue SSI  HTN -home hydralazine 100 mg twice daily, Imdur 60 mg daily, and Lasix -holding BP meds for soft BPs  CAD PAD  had MI in 1994, CABG 1997 with LIMA-LAD and seq SVG-OD. Had PCI to proximal RCA in 2018. Last catheterization in March 2018 with patent SVG-OD, patent LIMA to LAD, 80% stenosis of RCA which was stented. -Continue home pravastatin 40 mg, Imdur 60 mg, warfarin as above -No Unna boots given PAD   Diet: heart healthy IVF: None VTE: Coumadin Prior to Admission Living Arrangement: Home Anticipated Discharge Location: Home with HH, family to provide 24hr care Barriers to Discharge: hyponatremia Dispo: Anticipated discharge in approximately pending relief from HFNC.   Remo Lipps, MD 06/08/2020, 7:34 AM Pager: [phone] After 5pm on weekdays and 1pm on  weekends: On Call Pager: [phone]

## 2020-06-08 NOTE — Progress Notes (Addendum)
Patient ID: Isaiah [name], male   DOB: [birth year], 80 y.o.   MRN: [id number]     Advanced Heart Failure Rounding Note  PCP-Cardiologist: Nanetta Batty, MD    Patient Profile:    80 y/o male w/ CAD, Afib, Stage III CKD and severe chronic hypoxic/hypercarbic respiratory failure due to OSA/OHS with baseline pCO2 ~ 80. Now with severe RV failure and massive volume overload.   Subjective:    Feels ok today.   CVP 7. Wt unchanged.   Na still low despite tolvaptan, 122.   Continues in slow Afib, upper 40s-low 50s but not highly symptomatic. Mentation ok. A&Ox3.   Remains on torsemide 40 bid. Primary team stopped Diamox yesterday. CO2 34.     RHC Procedural Findings: Hemodynamics (mmHg) RA mean 9 RV 59/10 PA 60/22, mean 36 PCWP mean 16 Oxygen saturations: PA 70% AO 98% Cardiac Output (Fick) 7.8  Cardiac Index (Fick) 3.7 PVR 2.6 WU   Objective:   Weight Range: 90.7 kg Body mass index is 29.53 kg/m.   Vital Signs:   Temp:  [96.8 F (36 C)-97.9 F (36.6 C)] 97.5 F (36.4 C) (01/26 0752) Pulse Rate:  [40-46] 44 (01/26 0752) Resp:  [14-18] 15 (01/26 0752) BP: (55-133)/(45-102) 128/52 (01/26 0752) SpO2:  [95 %-100 %] 95 % (01/26 0752) FiO2 (%):  [30 %] 30 % (01/25 2242) Weight:  [90.7 kg] 90.7 kg (01/26 0400) Last BM Date: 06/07/20  Weight change: Filed Weights   06/06/20 0453 06/07/20 0417 06/08/20 0400  Weight: 88.5 kg 90.5 kg 90.7 kg    Intake/Output:   Intake/Output Summary (Last 24 hours) at 06/08/2020 1115 Last data filed at 06/08/2020 0900 Gross per 24 hour  Intake 280 ml  Output 1251 ml  Net -971 ml      Physical Exam   CVP 7  General: chronically ill appearing. OOB, sitting up in chair. No distress  HEENT: normal Neck: supple. no JVD. Carotids 2+ bilat; no bruits. No lymphadenopathy or thryomegaly appreciated. Cor: PMI nondisplaced. Irregular rhythm, slow rate. No rubs, gallops or murmurs. Lungs: decreased BS bilaterally, faint crackles LLL  that improves w/ cough  Abdomen: soft, nontender, nondistended. No hepatosplenomegaly. No bruits or masses. Good bowel sounds. R cholecystostomy tube Extremities: no cyanosis, clubbing, rash, edema. + RUE PICC + TED hoses  Neuro: alert & orientedx3, cranial nerves grossly intact. moves all 4 extremities w/o difficulty. Affect pleasant    Telemetry   Slow afib, upper 40s-low 50s   Labs    CBC Recent Labs    06/08/20 0430  WBC 5.5  HGB 9.9*  HCT 30.6*  MCV 93.3  PLT 225   Basic Metabolic Panel Recent Labs    86/57/84 0543 06/07/20 1800 06/08/20 0430  NA 122* 123* 122*  K 3.6  --  3.9  CL 75*  --  76*  CO2 34*  --  34*  GLUCOSE 136*  --  146*  BUN 56*  --  56*  CREATININE 1.78*  --  1.62*  CALCIUM 9.1  --  9.0  MG 2.2  --   --    Liver Function Tests No results for input(s): AST, ALT, ALKPHOS, BILITOT, PROT, ALBUMIN in the last 72 hours. No results for input(s): LIPASE, AMYLASE in the last 72 hours. Cardiac Enzymes No results for input(s): CKTOTAL, CKMB, CKMBINDEX, TROPONINI in the last 72 hours.  BNP: BNP (last 3 results) Recent Labs    03/22/20 0117 05/20/20 1133 06/01/20 0511  BNP 441.2* 618.9* 275.2*  ProBNP (last 3 results) No results for input(s): PROBNP in the last 8760 hours.   D-Dimer No results for input(s): DDIMER in the last 72 hours. Hemoglobin A1C No results for input(s): HGBA1C in the last 72 hours. Fasting Lipid Panel No results for input(s): CHOL, HDL, LDLCALC, TRIG, CHOLHDL, LDLDIRECT in the last 72 hours. Thyroid Function Tests No results for input(s): TSH, T4TOTAL, T3FREE, THYROIDAB in the last 72 hours.  Invalid input(s): FREET3  Other results:   Imaging    No results found.   Medications:     Scheduled Medications: . allopurinol  100 mg Oral BID  . chlorhexidine  15 mL Mouth Rinse BID  . Chlorhexidine Gluconate Cloth  6 each Topical Daily  . empagliflozin  10 mg Oral Daily  . ferrous sulfate  325 mg Oral Q  breakfast  . gabapentin  100 mg Oral TID  . insulin aspart  0-15 Units Subcutaneous TID WC  . mouth rinse  15 mL Mouth Rinse q12n4p  . OLANZapine zydis  5 mg Oral QHS  . pantoprazole  40 mg Oral BID AC  . polyethylene glycol  17 g Oral BID  . pravastatin  40 mg Oral QHS  . senna  1 tablet Oral Daily  . sildenafil  20 mg Oral TID  . sodium chloride flush  10-40 mL Intracatheter Q12H  . torsemide  40 mg Oral BID  . Warfarin - Pharmacist Dosing Inpatient   Does not apply q1600    Infusions:   PRN Medications: acetaminophen, bisacodyl, ondansetron (ZOFRAN) IV, sodium chloride flush   Assessment/Plan   1. Acute on chronic heart failure with mid-range EF and severe RV failure:  Echo this admission with EF 45-50%, anteroseptal HK, D-shaped septum with PASP 63 mmHg and dilated IVC.  During 11/21 admission, initial echo showed EF 20% with severe RV dysfunction.  Repeat echo later in hospitalization with EF up to 55% and mild RV dysfunction.  Thought to have stress/Takotsubo cardiomyopathy in setting of septic shock/cholecystitis. Co-ox has been ok. Has diuresed well. RHC after diuresis showed  mildly elevated filling pressures with moderate pulmonary hypertension.  PVR not particularly high due to relatively high cardiac output.  As PCWP was not markedly elevated, he was started on trial of sildenafil 20 mg tid. He was restarted on IV Lasix and metolazone over the weekend with rise in CVP.  - CVP 7 today. Na low at 122 despite tolvaptan x 2 doses  - ? Holding diuretics today + give additional tolvaptan  - Continue Jardiance. - Holding Diamox  - no UNNA boots w/ severe PAD. Continue w/ TED hose  2. Acute on Chronic Hypoxic Respiratory Failure due to end-stage OSA/OHS baseline pCO2 85-95 range. He does not have CPAP or Bipap at home. - Now on 4 [place] during day and Bipap at night.  - Suspect truly end-stage. Need to consider tracheostomy but he refuses. 3. Atrial fibrillation: Chronic, rate  controlled. H/o CVA.  - Continue warfarin, INR has been therapeutic  4. CAD: Anterior MI 1994, CABG 1997 with LIMA-LAD and seq SVG-OM/D.  Had PCI to proximal RCA in 2018.  No chest pain. EF has generally been in the 50% range (45-50% on echo this admission). HS-TnI minimally elevated with no trend this admission, ACS is unlikely. No chest pain.  - Continue home statin.  - He is on warfarin so no ASA.  5. PAD: H/o L=>R fem-fem crossover grafting, had to be removed due to infection and had patch angioplasty.  6. AKI on CKD stage 3: Creatinine trending up. 1.6>1.8>1.6  7. H/o acute cholecystitis: Has cholecystostomy tube from 11/21.  8. Contractile Alkalosis -  Continue diamox  9. GOC: prognosis is poor. CCM & palliative care family meeting, continue full support with goal to get him discharged to home.  10. Hyponatremia - Na 122 - Give another dose of tolvaptan  - Limit free water  - ? Hold diuretics today (CVP 7)  - check urine sodium  11. Deconditioning - continue PT/OT, recommending SNF. Pt and family want home w/ [facility] services.    Length of Stay: [address], PA-C  06/08/2020, 11:15 AM  Advanced Heart Failure Team Pager [phone] (M-F; 7a - 4p)  Please contact [facility] for night-coverage after hours (4p -7a ) and weekends on [email or web address]  Patient seen with PA, agree with the above note.   CVP is 7 and creatinine stable, but Na still 122 despite tolvaptan.  He has multiple cups of various beverages and water on his bedside table.   No dyspnea, feels good.    Currently on 4L [place].   General: NAD Neck: No JVD, no thyromegaly or thyroid nodule.  Lungs: Decreased BS left base.  CV: Nondisplaced PMI.  Heart regular S1/S2, no S3/S4, no murmur.  No peripheral edema.   Abdomen: Soft, nontender, no hepatosplenomegaly, no distention.  Skin: Intact without lesions or rashes.  Neurologic: Alert and oriented x 3.  Psych: Normal affect. Extremities: No clubbing or cyanosis.   HEENT: Normal.   Very hyponatremic though mentation seems ok.  Needs more effective fluid restriction, discussed with nursing.   - Fluid restrict 1-1.2 L/day.  - Will give tolvaptan 30 mg x 1 today.  - Will hold torsemide until tomorrow (has had am dose).   I am going to get PA/lateral CXR to assess for pleural effusion (decreased BS right base).  He remains on 4L [place] during the day and Bipap at night.   Creatinine stable on current regimen.   HR currently upper 40s, atrial fibrillation.  BP stable.  No lightheadedness.  He is not on nodal blockade.  Would observe for now.   Marca Ancona 06/08/2020 11:58 AM

## 2020-06-08 NOTE — Progress Notes (Signed)
ANTICOAGULATION CONSULT NOTE  Pharmacy Consult for warfarin Indication: atrial fibrillation  No Known Allergies  Patient Measurements: Height: 5\' 9"  (175.3 cm) Weight: 90.7 kg (199 lb 15.3 oz) IBW/kg (Calculated) : 70.7  Vital Signs: Temp: 97.5 F (36.4 C) (01/26 0752) Temp Source: Oral (01/26 0752) BP: 128/52 (01/26 0752) Pulse Rate: 44 (01/26 0752)  Labs: Recent Labs    06/06/20 0530 06/07/20 0543 06/08/20 0430  HGB  --   --  9.9*  HCT  --   --  30.6*  PLT  --   --  225  LABPROT 23.9* 24.4* 24.2*  INR 2.2* 2.3* 2.3*  CREATININE 1.60* 1.78* 1.62*    Estimated Creatinine Clearance: 41.2 mL/min (A) (by C-G formula based on SCr of 1.62 mg/dL (H)).   Assessment: 80 year old male admitted for HF exacerbation. PTA the patient takes warfarin for Afib (PTA dose of 6 mg daily). Pharmacy is consulted to dose warfarin.  INR this morning is therapeutic at 2.3. CBC stable today.  Goal of Therapy:  INR 2-3 Monitor platelets by anticoagulation protocol: Yes   Plan:  -Warfarin 6mg  x1 tonight (PTA regimen) -Daily INR  66, PharmD, BCPS, [facility] Clinical Pharmacist [phone] Please check AMION for all [facility] Pharmacy numbers 06/08/2020

## 2020-06-09 ENCOUNTER — Telehealth (HOSPITAL_COMMUNITY): Payer: Self-pay | Admitting: Licensed Clinical Social Worker

## 2020-06-09 DIAGNOSIS — I50810 Right heart failure, unspecified: Secondary | ICD-10-CM | POA: Diagnosis not present

## 2020-06-09 LAB — COMPREHENSIVE METABOLIC PANEL
ALT: 10 U/L (ref 0–44)
AST: 19 U/L (ref 15–41)
Albumin: 3.3 g/dL — ABNORMAL LOW (ref 3.5–5.0)
Alkaline Phosphatase: 78 U/L (ref 38–126)
Anion gap: 12 (ref 5–15)
BUN: 57 mg/dL — ABNORMAL HIGH (ref 8–23)
CO2: 33 mmol/L — ABNORMAL HIGH (ref 22–32)
Calcium: 9.1 mg/dL (ref 8.9–10.3)
Chloride: 80 mmol/L — ABNORMAL LOW (ref 98–111)
Creatinine, Ser: 1.57 mg/dL — ABNORMAL HIGH (ref 0.61–1.24)
GFR, Estimated: 45 mL/min — ABNORMAL LOW (ref >60)
Glucose, Bld: 133 mg/dL — ABNORMAL HIGH (ref 70–99)
Potassium: 3.7 mmol/L (ref 3.5–5.1)
Sodium: 125 mmol/L — ABNORMAL LOW (ref 135–145)
Total Bilirubin: 0.2 mg/dL — ABNORMAL LOW (ref 0.3–1.2)
Total Protein: 7.6 g/dL (ref 6.5–8.1)

## 2020-06-09 LAB — LIPID PANEL
Cholesterol: 111 mg/dL (ref 0–200)
HDL: 41 mg/dL (ref >40)
LDL Cholesterol: 49 mg/dL (ref 0–99)
Total CHOL/HDL Ratio: 2.7 RATIO
Triglycerides: 104 mg/dL (ref <150)
VLDL: 21 mg/dL (ref 0–40)

## 2020-06-09 LAB — GLUCOSE, CAPILLARY
Glucose-Capillary: 136 mg/dL — ABNORMAL HIGH (ref 70–99)
Glucose-Capillary: 202 mg/dL — ABNORMAL HIGH (ref 70–99)
Glucose-Capillary: 180 mg/dL — ABNORMAL HIGH (ref 70–99)

## 2020-06-09 LAB — OSMOLALITY: Osmolality: 290 mOsm/kg (ref 275–295)

## 2020-06-09 LAB — PROTIME-INR
INR: 2.3 — ABNORMAL HIGH (ref 0.8–1.2)
Prothrombin Time: 24.2 seconds — ABNORMAL HIGH (ref 11.4–15.2)

## 2020-06-09 LAB — SODIUM: Sodium: 128 mmol/L — ABNORMAL LOW (ref 135–145)

## 2020-06-09 MED ORDER — TORSEMIDE 20 MG PO TABS
40.0000 mg | ORAL_TABLET | Freq: Every day | ORAL | Status: DC
Start: 2020-06-09 — End: 2020-06-10
  Administered 2020-06-09 – 2020-06-10 (×2): 40 mg via ORAL
  Filled 2020-06-09 (×2): qty 2

## 2020-06-09 MED ORDER — TOLVAPTAN 15 MG PO TABS
15.0000 mg | ORAL_TABLET | Freq: Once | ORAL | Status: AC
  Administered 2020-06-09: 15 mg via ORAL
  Filled 2020-06-09: qty 1

## 2020-06-09 MED ORDER — WARFARIN SODIUM 5 MG PO TABS
6.0000 mg | ORAL_TABLET | Freq: Once | ORAL | Status: AC
  Administered 2020-06-09: 6 mg via ORAL
  Filled 2020-06-09: qty 1

## 2020-06-09 NOTE — Progress Notes (Signed)
Subjective:   He is feeling great today. Breathing is doing well today. He is having difficulty with hearing as he needs new hearing aids.   Objective:  Vital signs in last 24 hours: Vitals:   06/08/20 2335 06/09/20 0024 06/09/20 0432 06/09/20 0436  BP:  (!) 123/45 (!) 111/54   Pulse: (!) 44 72 (!) 43   Resp: (!) 21 15 18    Temp:  99.1 F (37.3 C) (!) 97.2 F (36.2 C)   TempSrc:  Axillary Axillary   SpO2: 100% 98% 99%   Weight:    89.3 kg  Height:        Physical Exam Constitutional: Sitting in chair comfortably, mildly hard of hearing Head: atraumatic ENT: external ears normal Eyes: EOMI Cardiovascular: normal rate, normal heart sounds, no pitting edema Pulmonary: effort normal, minimal crackles at L lung base, breathing comfortably on nasal canula Abdominal: flat, bowel sounds active. Cholecystostomy bag in place with minimal bilious and sanguinous drainage Skin: warm and dry Neurological: alert, no focal deficit, attention is improving but still with poor understanding of situation Psychiatric: normal mood and affect  Assessment/Plan:  Active Problems:   AKI (acute kidney injury) (HCC)   Acute exacerbation of congestive heart failure (HCC)   Acute on chronic respiratory failure with hypoxia and hypercapnia (HCC)  Isaiah [name] is a 80 y.o. male with hx of HFpEF (EF 55 to 60%), CAD, DM, CVA, CKD 3, HLD, OSA, A. fib on Coumadin presenting with shortness of breath.  Volume up on exam initially and required BiPAP, now on high flow nasal cannula. No longer with peripheral edema but CXR still demonstrated pulmonary edema, continuing to diurese.   Acute on chronic hypoxic and hypercapnic respiratory failure, improving Cor pulmonale Pulmonary hypertension OSA HFmrEF exacerbation Currently requires 1-3L of [place] during the day, and BIPAP at night. Peripheral edema has resolved s/p diuresis of >30L this admission, but CXR with continued pulmonary edema and crackles on lung  exam. Last echo on 1/9 with LVEF 45 to 50% and severely elevated pulmonary artery pressure.  Had R heart cath on 1/20 which showed moderate pulmonary HTN and moderately elevated R and L heart filling pressures. OSA and likely OHS also contributing. Had a brief respiratory arrest on 1/9 for which was admitted to the ICU and placed on BiPAP.  Ultimately did not require intubation. At times has required BiPAP during the day at times for CO2 retention. - Heart failure team consulted, appreciate recommendations      - decrease torsemide to 40mg  daily, this will likely be his home dose     - Jardiance 10mg      - another dose of tolvaptan 15mg  today     - continue TED hose, no unna boots due to PAD - discontinue Diamox - continue sildinafil 20mg  TID - Fluid restriction to 800cc - BiPAP at night - will likely need to start during the day as well.  - exchange home CPAP for BiPAP on discharge  Metabolic alkalosis Hyponatremia Sodium improving 125 < 122. Was mildly hyponatremic to 132 on admission which initially normalized, worsened after starting diuretics as above (torsemide, metolazone, diamox).  Serum osmolality has been normal x2 suggesting pseudohyponatremia, but his protein lipids and glucose are not grossly abnormal.  Likely dilution from heart failure contributing.  Alkalosis most likely due contractions Bicarb 34 < 35. OHS could also be contributing.  - discontinued Diamox - another dose of tolvaptan 15mg  today  A fib with slow ventricular response Rate  controlled without meds, on coumadin. Over past several days had intermittent episodes of bradycardia, but now is persistently in 40s. Asymptomatic, walked with PT and denies dizziness. Likely not a candidate for pacemaker. -Continue home Coumadin - continue telemetry  Goals of care Goals of care were discussed with family on 1/18 and they want full scope of care and full code, but not prolonged life support, tracheostomy, or hemodialysis.   They understand he has a poor prognosis.  - plan to go home once medically stable  Pressure injuries Has had long hospital course. Noted to have the beginning of a pressure injury to R heel, small pressure injury to left 2nd digit from pressing against the 3rd digit toenail, and blood blisters to distal 3rd toe and subgunal of 5th toe. - prevalon boots - small dressing to R 2nd toe - continue to monitor  AKI on CKD 3 vs progression of CKD Creatinine was 1.90 on admission, today is 1.57 < 1.62 < 1.78 < 1.6. This may be his new baseline. Prior baseline around 1.3.  Likely prerenal/cardiorenal, but also in setting of diuresis. - Diuretics as above - Daily BMP - Avoid nephrotoxins  Recent acute cholecystitis complicated by sepsis Recent hospitalization for acute cholecystitis and sepsis complicated by brief cardiac arrest.  Initial plan for cardiac clearance and cholecystectomy in 8 weeks, but underwent IR Coley cystotomy for severity of illness on 11/12 and had a brief cardiac arrest during this procedure.   -trend output -Need to clarify plan before discharge   DM type II Home regimen of 70/30 50 units twice daily and glipizide 5 mg twice daily.  Now requiring <10 units daily. -Continue SSI  HTN -home hydralazine 100 mg twice daily, Imdur 60 mg daily, and Lasix -holding BP meds for soft BPs  CAD PAD  had MI in 1994, CABG 1997 with LIMA-LAD and seq SVG-OD. Had PCI to proximal RCA in 2018. Last catheterization in March 2018 with patent SVG-OD, patent LIMA to LAD, 80% stenosis of RCA which was stented. -Continue home pravastatin 40 mg, Imdur 60 mg, warfarin as above -No Unna boots given PAD   Diet: heart healthy, 800cc restriction IVF: None VTE: Coumadin Prior to Admission Living Arrangement: Home Anticipated Discharge Location: Home with HH, family to provide 24hr care Barriers to Discharge: hyponatremia Dispo: Anticipated discharge in approximately pending relief from  HFNC.   Remo Lipps, MD 06/09/2020, 6:31 AM Pager: [phone] After 5pm on weekdays and 1pm on weekends: On Call Pager: [phone]

## 2020-06-09 NOTE — Progress Notes (Addendum)
  Mr. Alcoser is s/p percutaneous cholecystostomy placed 03/16/2020.  Outpatient order is in place, however patient somehow lost to follow up.  Will arrange routine exchange while he is here only if IR schedule allows.  Do NOT hold up patient discharge for chole tube change. We can arrange this as outpatient.  Jerry Caras Timmie Dugue PA-C 06/09/2020 12:54 PM

## 2020-06-09 NOTE — Progress Notes (Signed)
ANTICOAGULATION CONSULT NOTE  Pharmacy Consult for warfarin Indication: atrial fibrillation  No Known Allergies  Patient Measurements: Height: 5\' 9"  (175.3 cm) Weight: 89.3 kg (196 lb 13.9 oz) IBW/kg (Calculated) : 70.7  Vital Signs: Temp: 97.6 F (36.4 C) (01/27 0735) Temp Source: Axillary (01/27 0735) BP: 119/44 (01/27 0735) Pulse Rate: 47 (01/27 0847)  Labs: Recent Labs    06/07/20 0543 06/08/20 0430 06/09/20 0555  HGB  --  9.9*  --   HCT  --  30.6*  --   PLT  --  225  --   LABPROT 24.4* 24.2* 24.2*  INR 2.3* 2.3* 2.3*  CREATININE 1.78* 1.62* 1.57*    Estimated Creatinine Clearance: 42.1 mL/min (A) (by C-G formula based on SCr of 1.57 mg/dL (H)).   Assessment: 80 year old male admitted for HF exacerbation. PTA the patient takes warfarin for Afib (PTA dose of 6 mg daily). Pharmacy is consulted to dose warfarin.  INR this morning is therapeutic at 2.3. CBC stable today.  Goal of Therapy:  INR 2-3 Monitor platelets by anticoagulation protocol: Yes   Plan:  -Warfarin 6mg  x1 tonight (PTA regimen) -Daily INR  66, PharmD, BCPS, [facility] Clinical Pharmacist [phone] Please check AMION for all [facility] Pharmacy numbers 06/09/2020

## 2020-06-09 NOTE — Progress Notes (Signed)
Patient ID: Isaiah [name], male   DOB: 04/04/1941, 80 y.o.   MRN: [id number]     Advanced Heart Failure Rounding Note  PCP-Cardiologist: Nanetta Batty, MD    Patient Profile:    80 y/o male w/ CAD, Afib, Stage III CKD and severe chronic hypoxic/hypercarbic respiratory failure due to OSA/OHS with baseline pCO2 ~ 80. Now with severe RV failure and massive volume overload.   Subjective:    No complaints today.   CVP 5. Creatinine lower at 1.57.   Na up to 125 today, got tolvaptan yesterday.    Continues in slow Afib, upper 40s-low 50s but not highly symptomatic. Mentation ok.  Remains on torsemide 40 bid. Primary team stopped Diamox yesterday. CO2 34.     RHC Procedural Findings: Hemodynamics (mmHg) RA mean 9 RV 59/10 PA 60/22, mean 36 PCWP mean 16 Oxygen saturations: PA 70% AO 98% Cardiac Output (Fick) 7.8  Cardiac Index (Fick) 3.7 PVR 2.6 WU   Objective:   Weight Range: 89.3 kg Body mass index is 29.07 kg/m.   Vital Signs:   Temp:  [97.2 F (36.2 C)-99.1 F (37.3 C)] 97.6 F (36.4 C) (01/27 0735) Pulse Rate:  [43-72] 47 (01/27 0847) Resp:  [15-21] 15 (01/27 0847) BP: (111-150)/(44-75) 119/44 (01/27 0735) SpO2:  [98 %-100 %] 100 % (01/27 0847) FiO2 (%):  [30 %] 30 % (01/26 2335) Weight:  [89.3 kg] 89.3 kg (01/27 0436) Last BM Date: 06/07/20  Weight change: Filed Weights   06/07/20 0417 06/08/20 0400 06/09/20 0436  Weight: 90.5 kg 90.7 kg 89.3 kg    Intake/Output:   Intake/Output Summary (Last 24 hours) at 06/09/2020 0950 Last data filed at 06/09/2020 0346 Gross per 24 hour  Intake 637 ml  Output 2070 ml  Net -1433 ml      Physical Exam   CVP 5  General: NAD Neck: No JVD, no thyromegaly or thyroid nodule.  Lungs: Clear to auscultation bilaterally with normal respiratory effort. CV: Nondisplaced PMI.  Heart irregular S1/S2, no S3/S4, no murmur.  No peripheral edema.   Abdomen: Soft, nontender, no hepatosplenomegaly, no distention.  Skin:  Intact without lesions or rashes.  Neurologic: Alert and oriented x 3.  Psych: Normal affect. Extremities: No clubbing or cyanosis.  HEENT: Normal.    Telemetry   Slow afib, upper 40s-low 50s (personally reviewed)   Labs    CBC Recent Labs    06/08/20 0430  WBC 5.5  HGB 9.9*  HCT 30.6*  MCV 93.3  PLT 225   Basic Metabolic Panel Recent Labs    19/14/78 0543 06/07/20 1800 06/08/20 0430 06/08/20 1800 06/09/20 0555  NA 122*   < > 122* 122* 125*  K 3.6  --  3.9  --  3.7  CL 75*  --  76*  --  80*  CO2 34*  --  34*  --  33*  GLUCOSE 136*  --  146*  --  133*  BUN 56*  --  56*  --  57*  CREATININE 1.78*  --  1.62*  --  1.57*  CALCIUM 9.1  --  9.0  --  9.1  MG 2.2  --   --   --   --    < > = values in this interval not displayed.   Liver Function Tests Recent Labs    06/09/20 0555  AST 19  ALT 10  ALKPHOS 78  BILITOT 0.2*  PROT 7.6  ALBUMIN 3.3*   No results for input(s):  LIPASE, AMYLASE in the last 72 hours. Cardiac Enzymes No results for input(s): CKTOTAL, CKMB, CKMBINDEX, TROPONINI in the last 72 hours.  BNP: BNP (last 3 results) Recent Labs    03/22/20 0117 05/20/20 1133 06/01/20 0511  BNP 441.2* 618.9* 275.2*    ProBNP (last 3 results) No results for input(s): PROBNP in the last 8760 hours.   D-Dimer No results for input(s): DDIMER in the last 72 hours. Hemoglobin A1C No results for input(s): HGBA1C in the last 72 hours. Fasting Lipid Panel Recent Labs    06/09/20 0555  CHOL 111  HDL 41  LDLCALC 49  TRIG 104  CHOLHDL 2.7   Thyroid Function Tests No results for input(s): TSH, T4TOTAL, T3FREE, THYROIDAB in the last 72 hours.  Invalid input(s): FREET3  Other results:   Imaging    DG Chest 2 View  Result Date: 06/08/2020 CLINICAL DATA:  Shortness of breath EXAM: CHEST - 2 VIEW COMPARISON:  06/02/2020 FINDINGS: Right-sided PICC line terminates at the level of the superior cavoatrial junction. Post CABG changes. Stable heart size.  Atherosclerotic calcification of the aortic knob. Mild diffuse interstitial prominence suggesting mild edema, slightly improved from prior. Improving aeration of the lung bases. Decreasing small bilateral pleural effusions. No pneumothorax. Thoracic fusion hardware noted. IMPRESSION: Improving aeration of the lung bases and decreasing small bilateral pleural effusions. Electronically Signed   By: Duanne Guess D.O.   On: 06/08/2020 14:57     Medications:     Scheduled Medications: . allopurinol  100 mg Oral BID  . chlorhexidine  15 mL Mouth Rinse BID  . Chlorhexidine Gluconate Cloth  6 each Topical Daily  . empagliflozin  10 mg Oral Daily  . ferrous sulfate  325 mg Oral Q breakfast  . gabapentin  100 mg Oral TID  . insulin aspart  0-15 Units Subcutaneous TID WC  . mouth rinse  15 mL Mouth Rinse q12n4p  . pantoprazole  40 mg Oral BID AC  . polyethylene glycol  17 g Oral BID  . pravastatin  40 mg Oral QHS  . senna  1 tablet Oral Daily  . sildenafil  20 mg Oral TID  . sodium chloride flush  10-40 mL Intracatheter Q12H  . Warfarin - Pharmacist Dosing Inpatient   Does not apply q1600    Infusions:   PRN Medications: acetaminophen, bisacodyl, ondansetron (ZOFRAN) IV, sodium chloride flush   Assessment/Plan   1. Acute on chronic heart failure with mid-range EF and severe RV failure:  Echo this admission with EF 45-50%, anteroseptal HK, D-shaped septum with PASP 63 mmHg and dilated IVC.  During 11/21 admission, initial echo showed EF 20% with severe RV dysfunction.  Repeat echo later in hospitalization with EF up to 55% and mild RV dysfunction.  Thought to have stress/Takotsubo cardiomyopathy in setting of septic shock/cholecystitis. Co-ox has been ok. Has diuresed well. RHC after diuresis showed  mildly elevated filling pressures with moderate pulmonary hypertension.  PVR not particularly high due to relatively high cardiac output.  As PCWP was not markedly elevated, he was started on  trial of sildenafil 20 mg tid.  CVP 5 today, torsemide on hold currently.  - Think he can start back on torsemide 40 mg daily today (probably will be his dose for home).  - Continue Jardiance. - no UNNA boots w/ severe PAD. Continue w/ TED hose  2. Acute on Chronic Hypoxic Respiratory Failure due to end-stage OSA/OHS baseline pCO2 85-95 range. He does not have CPAP or Bipap at  home.  - On HFNC during the day and Bipap at night.  - Suspect truly end-stage. Need to consider tracheostomy but he refuses. 3. Atrial fibrillation: Chronic, rate controlled. H/o CVA.  Suspect tachy-brady syndrome, HR in 40s at times but asymptomatic with stable BP.  No PPM indication.  - Continue warfarin, INR has been therapeutic  4. CAD: Anterior MI 1994, CABG 1997 with LIMA-LAD and seq SVG-OM/D.  Had PCI to proximal RCA in 2018.  No chest pain. EF has generally been in the 50% range (45-50% on echo this admission). HS-TnI minimally elevated with no trend this admission, ACS is unlikely. No chest pain.  - Continue home statin.  - He is on warfarin so no ASA.  5. PAD: H/o L=>R fem-fem crossover grafting, had to be removed due to infection and had patch angioplasty.  6. AKI on CKD stage 3: Stable at 1.57 today.  7. H/o acute cholecystitis: Has cholecystostomy tube from 11/21.  8. Contractile Alkalosis: Improved, now off acetazolamide.  9. GOC: prognosis is poor. CCM & palliative care family meeting, continue full support with goal to get him discharged to home.  10. Hyponatremia: Sodium better today at 125. - Give another dose of tolvaptan 15 mg today.  - Limit free water  11. Deconditioning: Continue PT/OT, recommending SNF. Pt and family want home w/ [facility] services.    Length of Stay: 20  Marca Ancona, MD  06/09/2020, 9:50 AM  Advanced Heart Failure Team Pager [phone] (M-F; 7a - 4p)  Please contact [facility] for night-coverage after hours (4p -7a ) and weekends on [email or web address]

## 2020-06-09 NOTE — Progress Notes (Signed)
Physical Therapy Treatment Patient Details Name: Isaiah [name] MRN: [id number] DOB: 1941-02-26 Today's Date: 06/09/2020    History of Present Illness 80 year old male presented with progressive shortness of breath and hypoxia with bilateral lower extremity swelling on admission.  Admitted per teaching service for acute CHF exacerbation and volume overload.  Morning of 05/22/2020 patient suffered brief respiratory arrest for which PCCM was consulted for further management.  s/p R heart cath 1/20. PMHx:  d CHF, DM2. (ropr CVA. afob. PSA. JTM. CAD. CLD stage 3.    PT Comments    Pt continues to make steady progress and continues to incr ambulation distance.  Plans to return home with daughter.  Follow Up Recommendations  Home health PT;Supervision/Assistance - 24 hour     Equipment Recommendations  Other (comment) (Daughter reports current 3-in- 1 is too narrow and needs wide 3-in-1 if possible.)    Recommendations for Other Services       Precautions / Restrictions Precautions Precautions: Fall    Mobility  Bed Mobility               General bed mobility comments: Pt up in chair  Transfers Overall transfer level: Needs assistance Equipment used: Rolling walker (2 wheeled) Transfers: Sit to/from [employer] Sit to Stand: Min assist         General transfer comment: Assist to bring hips up and for balance. Verbal cues for hand placement  Ambulation/Gait Ambulation/Gait assistance: Min assist;+2 safety/equipment Gait Distance (Feet): 100 Feet Assistive device: Rolling walker (2 wheeled) Gait Pattern/deviations: Step-through pattern;Decreased step length - right;Decreased step length - left;Trunk flexed Gait velocity: decr Gait velocity interpretation: <1.31 ft/sec, indicative of household ambulator General Gait Details: Assis for balance and support. More controlled pace. Assist for chair follow to allow pt to maximize gait prior to sitting and  resting   Stairs             Wheelchair Mobility    Modified Rankin (Stroke Patients Only)       Balance Overall balance assessment: Needs assistance Sitting-balance support: Feet supported;No upper extremity supported Sitting balance-Leahy Scale: Fair     Standing balance support: Bilateral upper extremity supported Standing balance-Leahy Scale: Poor Standing balance comment: walker and min assist for static standing                            Cognition Arousal/Alertness: Awake/alert Behavior During Therapy: WFL for tasks assessed/performed Overall Cognitive Status: Within Functional Limits for tasks assessed                                 General Comments: Pt HOH and at times slow to follow due to this      Exercises      General Comments General comments (skin integrity, edema, etc.): Pt on 7L O2 at rest. Amb on 6L with SpO2 >98%. Reduced O2 to 4L back in room      Pertinent Vitals/Pain Pain Assessment: No/denies pain    Home Living                      Prior Function            PT Goals (current goals can now be found in the care plan section) Acute Rehab PT Goals Patient Stated Goal: Like to take pt home. Progress towards PT goals: Progressing toward goals  Frequency    Min 3X/week      PT Plan Current plan remains appropriate    Co-evaluation              AM-PAC PT "6 Clicks" Mobility   Outcome Measure  Help needed turning from your back to your side while in a flat bed without using bedrails?: A Little Help needed moving from lying on your back to sitting on the side of a flat bed without using bedrails?: A Little Help needed moving to and from a bed to a chair (including a wheelchair)?: A Little Help needed standing up from a chair using your arms (e.g., wheelchair or bedside chair)?: A Little Help needed to walk in hospital room?: A Little Help needed climbing 3-5 steps with a railing? :  A Lot 6 Click Score: 17    End of Session Equipment Utilized During Treatment: Gait belt;Oxygen Activity Tolerance: Patient tolerated treatment well Patient left: in chair;with call bell/phone within reach;with chair alarm set Nurse Communication: Mobility status;Other (comment) (decr of O2) PT Visit Diagnosis: Other abnormalities of gait and mobility (R26.89);Muscle weakness (generalized) (M62.81)     Time: 1829-9371 PT Time Calculation (min) (ACUTE ONLY): 27 min  Charges:  $Gait Training: 23-37 mins                     [facility] PT Acute Rehabilitation Services Pager [phone] Office [phone]    Angelina Ok [facility] 06/09/2020, 2:10 PM

## 2020-06-09 NOTE — Progress Notes (Signed)
Occupational Therapy Treatment Patient Details Name: Isaiah [name] MRN: [id number] DOB: 10-20-1940 Today's Date: 06/09/2020    History of present illness 80 year old male presented with progressive shortness of breath and hypoxia with bilateral lower extremity swelling on admission.  Admitted per teaching service for acute CHF exacerbation and volume overload.  Morning of 05/22/2020 patient suffered brief respiratory arrest for which PCCM was consulted for further management.  s/p R heart cath 1/20. PMHx:  d CHF, DM2. (ropr CVA. afob. PSA. JTM. CAD. CLD stage 3.   OT comments  Pt received on BSC. Assisted with pericare and to doff soiled backside gown. Pt performed grooming standing at sink with min assist for standing balance. Returned to recliner at end of session. Pt progressing steadily. Family wishes to take pt home, updated d/c disposition to HHOT. Pt with Sp02 in min 90s on 4L 02 with activity. Asking when he gets to go home.   Follow Up Recommendations  Home health OT;Supervision/Assistance - 24 hour    Equipment Recommendations  3 in 1 bedside commode;Wheelchair (measurements OT);Wheelchair cushion (measurements OT)    Recommendations for Other Services      Precautions / Restrictions Precautions Precautions: Fall       Mobility Bed Mobility               General bed mobility comments: Pt up in chair  Transfers Overall transfer level: Needs assistance Equipment used: Rolling walker (2 wheeled) Transfers: Sit to/from [employer] Sit to Stand: Min guard Stand pivot transfers: Min guard       General transfer comment: cues for hand placement and management of multiple lines, but no physical assist from [facility]    Balance Overall balance assessment: Needs assistance Sitting-balance support: Feet supported;No upper extremity supported Sitting balance-Leahy Scale: Fair     Standing balance support: Bilateral upper extremity supported Standing  balance-Leahy Scale: Poor Standing balance comment: walker and min assist for static standing at sink                           ADL either performed or assessed with clinical judgement   ADL Overall ADL's : Needs assistance/impaired     Grooming: Oral care;Wash/dry hands;Wash/dry face;Standing;Minimal assistance           Upper Body Dressing : Minimal assistance;Sitting Upper Body Dressing Details (indicate cue type and reason): to doff soiled back side gown     Toilet Transfer: Min guard;Stand-pivot;RW;BSC   Toileting- Clothing Manipulation and Hygiene: Total assistance;Sit to/from stand       Functional mobility during ADLs: Health and safety inspector     Praxis      Cognition Arousal/Alertness: Awake/alert Behavior During Therapy: WFL for tasks assessed/performed Overall Cognitive Status: Within Functional Limits for tasks assessed                                 General Comments: [facility]        Exercises     Shoulder Instructions       General Comments     Pertinent Vitals/ Pain       Pain Assessment: No/denies pain  Home Living  Prior Functioning/Environment              Frequency  Min 2X/week        Progress Toward Goals  OT Goals(current goals can now be found in the care plan section)  Progress towards OT goals: Progressing toward goals  Acute Rehab OT Goals Patient Stated Goal: Like to take pt home. OT Goal Formulation: With patient Time For Goal Achievement: 06/11/20 Potential to Achieve Goals: Good  Plan Discharge plan needs to be updated    Co-evaluation                 AM-PAC OT "6 Clicks" Daily Activity     Outcome Measure   Help from another person eating meals?: None Help from another person taking care of personal grooming?: None Help from another person toileting, which includes using toliet,  bedpan, or urinal?: A Lot Help from another person bathing (including washing, rinsing, drying)?: A Lot Help from another person to put on and taking off regular upper body clothing?: A Little Help from another person to put on and taking off regular lower body clothing?: A Lot 6 Click Score: 17    End of Session Equipment Utilized During Treatment: Gait belt;Rolling walker;Oxygen  OT Visit Diagnosis: Unsteadiness on feet (R26.81);Muscle weakness (generalized) (M62.81)   Activity Tolerance Patient tolerated treatment well   Patient Left in chair;with call bell/phone within reach;with chair alarm set   Nurse Communication Other (comment) (contents of BSC left in bathroom for measurement)        Time: 6606-3016 OT Time Calculation (min): 20 min  Charges: OT General Charges $OT Visit: 1 Visit OT Treatments $Self Care/Home Management : 8-22 mins  Martie Round, OTR/L Acute Rehabilitation Services Pager: [phone] Office: [phone]   Evern Bio 06/09/2020, 3:31 PM

## 2020-06-09 NOTE — Telephone Encounter (Signed)
CSW consulted to assist pt with applying for Extra Help so we can proceed with [employer] application to help with Jardiance.  CSW attempted to contact patient to discuss- phone goes straight to message stating that VM has not been set up so unable to leave a message.  Will continue efforts to contact  Burna Sis, LCSW Clinical Social Worker Advanced [facility] Clinic Desk#: [phone] Cell#: [phone]

## 2020-06-10 ENCOUNTER — Inpatient Hospital Stay (HOSPITAL_COMMUNITY): Payer: Medicare Other

## 2020-06-10 DIAGNOSIS — I50810 Right heart failure, unspecified: Secondary | ICD-10-CM | POA: Diagnosis not present

## 2020-06-10 LAB — BASIC METABOLIC PANEL
Anion gap: 14 (ref 5–15)
BUN: 62 mg/dL — ABNORMAL HIGH (ref 8–23)
CO2: 33 mmol/L — ABNORMAL HIGH (ref 22–32)
Calcium: 9.4 mg/dL (ref 8.9–10.3)
Chloride: 82 mmol/L — ABNORMAL LOW (ref 98–111)
Creatinine, Ser: 1.75 mg/dL — ABNORMAL HIGH (ref 0.61–1.24)
GFR, Estimated: 39 mL/min — ABNORMAL LOW (ref >60)
Glucose, Bld: 164 mg/dL — ABNORMAL HIGH (ref 70–99)
Potassium: 3.7 mmol/L (ref 3.5–5.1)
Sodium: 129 mmol/L — ABNORMAL LOW (ref 135–145)

## 2020-06-10 LAB — SPIROMETRY WITH GRAPH
FEF 25-75 Pre: 1.59 L/sec
FEF2575-%Pred-Pre: 82 %
FEV1-%Pred-Pre: 34 %
FEV1-Pre: 0.96 L
FEV1FVC-%Pred-Pre: 131 %
FEV6-%Pred-Pre: 27 %
FEV6-Pre: 1.02 L
FEV6FVC-%Pred-Pre: 107 %
FVC-%Pred-Pre: 26 %
FVC-Pre: 1.02 L
Pre FEV1/FVC ratio: 94 %
Pre FEV6/FVC Ratio: 100 %

## 2020-06-10 LAB — PROTIME-INR
INR: 2.2 — ABNORMAL HIGH (ref 0.8–1.2)
Prothrombin Time: 23.3 seconds — ABNORMAL HIGH (ref 11.4–15.2)

## 2020-06-10 LAB — GLUCOSE, CAPILLARY
Glucose-Capillary: 153 mg/dL — ABNORMAL HIGH (ref 70–99)
Glucose-Capillary: 210 mg/dL — ABNORMAL HIGH (ref 70–99)
Glucose-Capillary: 141 mg/dL — ABNORMAL HIGH (ref 70–99)

## 2020-06-10 MED ORDER — POTASSIUM CHLORIDE CRYS ER 20 MEQ PO TBCR
40.0000 meq | EXTENDED_RELEASE_TABLET | Freq: Once | ORAL | Status: AC
  Administered 2020-06-10: 40 meq via ORAL
  Filled 2020-06-10: qty 2

## 2020-06-10 MED ORDER — TORSEMIDE 20 MG PO TABS: 40.0000 mg | ORAL_TABLET | Freq: Every day | ORAL | 0 refills | Status: DC

## 2020-06-10 MED ORDER — WARFARIN SODIUM 5 MG PO TABS: 6.0000 mg | ORAL_TABLET | Freq: Once | ORAL | Status: DC

## 2020-06-10 MED ORDER — GABAPENTIN 300 MG PO CAPS
300.0000 mg | ORAL_CAPSULE | Freq: Three times a day (TID) | ORAL | 0 refills | Status: AC
Start: 2020-06-10 — End: ?

## 2020-06-10 MED ORDER — GLIPIZIDE 5 MG PO TABS: 5.0000 mg | ORAL_TABLET | Freq: Every day | ORAL | 0 refills | Status: DC

## 2020-06-10 MED ORDER — SILDENAFIL CITRATE 20 MG PO TABS: 20.0000 mg | ORAL_TABLET | Freq: Three times a day (TID) | ORAL | 0 refills | Status: DC

## 2020-06-10 MED ORDER — EMPAGLIFLOZIN 10 MG PO TABS: 10.0000 mg | ORAL_TABLET | Freq: Every day | ORAL | 0 refills | Status: DC

## 2020-06-10 NOTE — Progress Notes (Addendum)
Subjective:   Seen on rounds while eating breakfast. He is breathing well, no pain. He is hoping to go home today and is in good spirits.    Objective:  Vital signs in last 24 hours: Vitals:   06/10/20 0305 06/10/20 0401 06/10/20 0436 06/10/20 0605  BP: 118/83 100/64 (!) 119/37   Pulse: (!) 40 (!) 43  (!) 42  Resp: (!) 21 20 14 14   Temp:  97.8 F (36.6 C)    TempSrc:  Axillary    SpO2: 97% 97%  96%  Weight:    89.3 kg  Height:        Physical Exam Constitutional: Sitting in chair comfortably, mildly hard of hearing Head: atraumatic ENT: external ears normal Eyes: EOMI Cardiovascular: in slow A fib, normal heart sounds, no pitting edema Pulmonary: effort normal, lungs clear to ascultation, breathing comfortably on nasal canula Abdominal: flat, bowel sounds active. Cholecystostomy bag in place with minimal bilious and sanguinous drainage Skin: warm and dry Neurological: alert, no focal deficit, attention is improving but still with poor understanding of situation Psychiatric: normal mood and affect  Assessment/Plan:  Active Problems:   AKI (acute kidney injury) (HCC)   Acute exacerbation of congestive heart failure (HCC)   Acute on chronic respiratory failure with hypoxia and hypercapnia (HCC)  Isaiah [name] is a 80 y.o. male with hx of HFpEF (EF 55 to 60%), CAD, DM, CVA, CKD 3, HLD, OSA, A. fib on Coumadin presenting with shortness of breath.  Volume up on exam initially and required BiPAP, now on high flow nasal cannula. No longer with peripheral edema but CXR still demonstrated pulmonary edema, continuing to diurese.   Acute on chronic hypoxic and hypercapnic respiratory failure, improving Cor pulmonale Pulmonary hypertension OSA Morbid obesity causing thoracic and OHS HFmrEF exacerbation Currently requires 1-3L of [place] during the day, and BIPAP at night. Peripheral edema has resolved s/p diuresis of >30L this admission, but CXR with continued pulmonary edema and  crackles on lung exam. Last echo on 1/9 with LVEF 45 to 50% and severely elevated pulmonary artery pressure.  Had R heart cath on 1/20 which showed moderate pulmonary HTN and moderately elevated R and L heart filling pressures. OSA and likely OHS also contributing. Had a brief respiratory arrest on 1/9 for which was admitted to the ICU and placed on BiPAP.  Ultimately did not require intubation. At times has required BiPAP during the day at times for CO2 retention. - Heart failure team consulted, appreciate recommendations      - decrease torsemide to 40mg  daily, this will likely be his home dose     - Jardiance 10mg      - another dose of tolvaptan 15mg  today     - continue TED hose, no unna boots due to PAD - continue sildinafil 20mg  TID - Fluid restriction to 800cc - BiPAP at night - will likely need to start during the day as well.  - exchange home CPAP for BiPAP on discharge  Metabolic alkalosis Hyponatremia Sodium improving 129 < 125. Was mildly hyponatremic to 132 on admission which initially normalized, worsened after starting diuretics as above (torsemide, metolazone, diamox).  Serum osmolality has been normal x2 suggesting pseudohyponatremia, but his protein lipids and glucose are not grossly abnormal.  Likely dilution from heart failure contributing.  Alkalosis most likely due contractions Bicarb 34 < 35. OHS could also be contributing.  - discontinued Diamox - discontinue Tolvaptan as his sodium is improving  A fib with slow  ventricular response Rate controlled without meds, on coumadin. Over past several days had intermittent episodes of bradycardia, but now is persistently in 40s. Asymptomatic, walked with PT and denies dizziness. Likely not a candidate for pacemaker. -Continue home Coumadin - continue telemetry  Goals of care Goals of care were discussed with family on 1/18 and they want full scope of care and full code, but not prolonged life support, tracheostomy, or  hemodialysis.  They understand he has a poor prognosis.  - plan to go home once medically stable  Pressure injuries Has had long hospital course. Noted to have the beginning of a pressure injury to R heel, small pressure injury to left 2nd digit from pressing against the 3rd digit toenail, and blood blisters to distal 3rd toe and subgunal of 5th toe. - prevalon boots - small dressing to R 2nd toe - continue to monitor  AKI on CKD 3 vs progression of CKD Creatinine was 1.90 on admission, today is 1.57 < 1.62 < 1.78 < 1.6. This may be his new baseline. Prior baseline around 1.3.  Likely prerenal/cardiorenal, but also in setting of diuresis. - Diuretics as above - Daily BMP - Avoid nephrotoxins  Recent acute cholecystitis complicated by sepsis Recent hospitalization for acute cholecystitis and sepsis complicated by brief cardiac arrest.  Initial plan for cardiac clearance and cholecystectomy in 8 weeks, but underwent IR Coley cystotomy for severity of illness on 11/12 and had a brief cardiac arrest during this procedure.   -trend output -per IR they may exchange if their schedule allows, but may follow up outpatient if not   DM type II Home regimen of 70/30 50 units twice daily and glipizide 5 mg twice daily.  Now requiring <10 units daily. -Continue SSI  HTN -home hydralazine 100 mg twice daily, Imdur 60 mg daily, and Lasix -holding BP meds for soft BPs  CAD PAD  had MI in 1994, CABG 1997 with LIMA-LAD and seq SVG-OD. Had PCI to proximal RCA in 2018. Last catheterization in March 2018 with patent SVG-OD, patent LIMA to LAD, 80% stenosis of RCA which was stented. -Continue home pravastatin 40 mg, Imdur 60 mg, warfarin as above -No Unna boots given PAD   Diet: heart healthy, 800cc restriction IVF: None VTE: Coumadin Prior to Admission Living Arrangement: Home Anticipated Discharge Location: Home with HH, family to provide 24hr care Barriers to Discharge: stable for  discharge Dispo: may discharge when home Trilogy machine is set up  Remo Lipps, MD 06/10/2020, 7:45 AM Pager: [phone] After 5pm on weekdays and 1pm on weekends: On Call Pager: [phone]

## 2020-06-10 NOTE — TOC Transition Note (Addendum)
Transition of Care [facility]) - CM/SW Discharge Note   Patient Details  Name: Isaiah [name] MRN: [id number] Date of Birth: 1940-08-21  [facility] CM/SW Contact:  Leone Haven, RN Phone Number: 06/10/2020, 4:18 PM   Clinical Narrative:    Patient is for dc today, NCM informed Zach with [employer], he states the triology will be set up at the home today and respirartory will come out to educate patient and family today.  Patient also already has home oxyen with [employer], Ian Malkin will take tank up to patient room for him to go home with.  HH was offered by previous NCM and [facility] was chosen.  NCM made referral to [place] she states they had him before and can take the referral.  Soc will begin 24 to 48 hrs post dc.  NCM gave patient the 14 day jardiance coupon.  Final next level of care: Home w Home Health Services Barriers to Discharge: No Barriers Identified   Patient Goals and CMS Choice Patient states their goals for this hospitalization and ongoing recovery are:: Daughter wants patient to return home      Discharge Placement                       Discharge Plan and Services In-house Referral: NA Discharge Planning Services: CM Consult Post Acute Care Choice: Home Health          DME Arranged: NIV DME Agency: [employer] Date DME Agency Contacted: 06/10/20 Time DME Agency Contacted: [phone] Representative spoke with at DME Agency: Ian Malkin HH Arranged: PT,OT,Nurse's Aide,RN [facility] Agency: [facility] Date [facility] Agency Contacted: 06/10/20 Time HH Agency Contacted: [phone] Representative spoke with at [facility] Agency: Jamesetta Orleans  Social Determinants of Health (SDOH) Interventions     Readmission Risk Interventions Readmission Risk Prevention Plan 06/08/2020  Transportation Screening Complete  Medication Review [employer]) Complete  HRI or Home Care Consult Complete  SW Recovery Care/Counseling Consult Complete  Palliative Care Screening Not  Applicable  Skilled Nursing Facility Not Applicable  Some recent data might be hidden

## 2020-06-10 NOTE — Progress Notes (Signed)
ANTICOAGULATION CONSULT NOTE  Pharmacy Consult for warfarin Indication: atrial fibrillation  No Known Allergies  Patient Measurements: Height: 5\' 9"  (175.3 cm) Weight: 89.3 kg (196 lb 13.9 oz) IBW/kg (Calculated) : 70.7  Vital Signs: Temp: 97.8 F (36.6 C) (01/28 0401) Temp Source: Axillary (01/28 0401) BP: 119/37 (01/28 0436) Pulse Rate: 45 (01/28 0818)  Labs: Recent Labs    06/08/20 0430 06/09/20 0555 06/10/20 0445  HGB 9.9*  --   --   HCT 30.6*  --   --   PLT 225  --   --   LABPROT 24.2* 24.2* 23.3*  INR 2.3* 2.3* 2.2*  CREATININE 1.62* 1.57* 1.75*    Estimated Creatinine Clearance: 37.8 mL/min (A) (by C-G formula based on SCr of 1.75 mg/dL (H)).   Assessment: 80 year old male admitted for HF exacerbation. PTA the patient takes warfarin for Afib (PTA dose of 6 mg daily). Pharmacy is consulted to dose warfarin.  INR this morning is therapeutic at 2.2.   Goal of Therapy:  INR 2-3 Monitor platelets by anticoagulation protocol: Yes   Plan:  -Warfarin 6mg  x1 tonight (PTA regimen) -Daily INR  66, PharmD, BCPS, [facility] Clinical Pharmacist [phone] Please check AMION for all [facility] Pharmacy numbers 06/10/2020

## 2020-06-10 NOTE — Progress Notes (Addendum)
Patient ID: Isaiah [name], male   DOB: 1940-08-15, 80 y.o.   MRN: [id number]     Advanced Heart Failure Rounding Note  PCP-Cardiologist: Nanetta Batty, MD    Patient Profile:    80 y/o male w/ CAD, Afib, Stage III CKD and severe chronic hypoxic/hypercarbic respiratory failure due to OSA/OHS with baseline pCO2 ~ 80. Now with severe RV failure and massive volume overload.   Subjective:    No complaints today.    Creatinine up to 1.5>1.7 .   Na up to 129 today, got tolvaptan yesterday.    Remains Afib, upper 40s-low 50s    Feeling ok.     RHC Procedural Findings: Hemodynamics (mmHg) RA mean 9 RV 59/10 PA 60/22, mean 36 PCWP mean 16 Oxygen saturations: PA 70% AO 98% Cardiac Output (Fick) 7.8  Cardiac Index (Fick) 3.7 PVR 2.6 WU   Objective:   Weight Range: 89.3 kg Body mass index is 29.07 kg/m.   Vital Signs:   Temp:  [97.2 F (36.2 C)-97.9 F (36.6 C)] 97.8 F (36.6 C) (01/28 0401) Pulse Rate:  [40-59] 45 (01/28 0818) Resp:  [14-26] 17 (01/28 0818) BP: (97-126)/(27-83) 119/37 (01/28 0436) SpO2:  [94 %-100 %] 94 % (01/28 0818) FiO2 (%):  [30 %] 30 % (01/28 0305) Weight:  [89.3 kg] 89.3 kg (01/28 0605) Last BM Date: 06/09/20  Weight change: Filed Weights   06/08/20 0400 06/09/20 0436 06/10/20 0605  Weight: 90.7 kg 89.3 kg 89.3 kg    Intake/Output:   Intake/Output Summary (Last 24 hours) at 06/10/2020 0853 Last data filed at 06/10/2020 0733 Gross per 24 hour  Intake 352 ml  Output 1300 ml  Net -948 ml      Physical Exam  CVP 5 General: Sitting on the side of the bed.  No resp difficulty HEENT: normal Neck: supple. no JVD. Carotids 2+ bilat; no bruits. No lymphadenopathy or thryomegaly appreciated. Cor: PMI nondisplaced. Irregular rate & rhythm. No rubs, gallops or murmurs. Lungs: clear Abdomen: soft, nontender, nondistended. No hepatosplenomegaly. No bruits or masses. Good bowel sounds. Extremities: no cyanosis, clubbing, rash, edema Neuro:  alert & orientedx3, cranial nerves grossly intact. moves all 4 extremities w/o difficulty. Affect pleasant   Telemetry   Afib 40-50s    Labs    CBC Recent Labs    06/08/20 0430  WBC 5.5  HGB 9.9*  HCT 30.6*  MCV 93.3  PLT 225   Basic Metabolic Panel Recent Labs    12/45/80 0555 06/09/20 1830 06/10/20 0445  NA 125* 128* 129*  K 3.7  --  3.7  CL 80*  --  82*  CO2 33*  --  33*  GLUCOSE 133*  --  164*  BUN 57*  --  62*  CREATININE 1.57*  --  1.75*  CALCIUM 9.1  --  9.4   Liver Function Tests Recent Labs    06/09/20 0555  AST 19  ALT 10  ALKPHOS 78  BILITOT 0.2*  PROT 7.6  ALBUMIN 3.3*   No results for input(s): LIPASE, AMYLASE in the last 72 hours. Cardiac Enzymes No results for input(s): CKTOTAL, CKMB, CKMBINDEX, TROPONINI in the last 72 hours.  BNP: BNP (last 3 results) Recent Labs    03/22/20 0117 05/20/20 1133 06/01/20 0511  BNP 441.2* 618.9* 275.2*    ProBNP (last 3 results) No results for input(s): PROBNP in the last 8760 hours.   D-Dimer No results for input(s): DDIMER in the last 72 hours. Hemoglobin A1C No results for  input(s): HGBA1C in the last 72 hours. Fasting Lipid Panel Recent Labs    06/09/20 0555  CHOL 111  HDL 41  LDLCALC 49  TRIG 104  CHOLHDL 2.7   Thyroid Function Tests No results for input(s): TSH, T4TOTAL, T3FREE, THYROIDAB in the last 72 hours.  Invalid input(s): FREET3  Other results:   Imaging    No results found.   Medications:     Scheduled Medications: . allopurinol  100 mg Oral BID  . chlorhexidine  15 mL Mouth Rinse BID  . Chlorhexidine Gluconate Cloth  6 each Topical Daily  . empagliflozin  10 mg Oral Daily  . ferrous sulfate  325 mg Oral Q breakfast  . gabapentin  100 mg Oral TID  . insulin aspart  0-15 Units Subcutaneous TID WC  . mouth rinse  15 mL Mouth Rinse q12n4p  . pantoprazole  40 mg Oral BID AC  . polyethylene glycol  17 g Oral BID  . pravastatin  40 mg Oral QHS  . senna  1  tablet Oral Daily  . sildenafil  20 mg Oral TID  . sodium chloride flush  10-40 mL Intracatheter Q12H  . torsemide  40 mg Oral Daily  . Warfarin - Pharmacist Dosing Inpatient   Does not apply q1600    Infusions:   PRN Medications: acetaminophen, bisacodyl, ondansetron (ZOFRAN) IV, sodium chloride flush   Assessment/Plan   1. Acute on chronic heart failure with mid-range EF and severe RV failure:  Echo this admission with EF 45-50%, anteroseptal HK, D-shaped septum with PASP 63 mmHg and dilated IVC.  During 11/21 admission, initial echo showed EF 20% with severe RV dysfunction.  Repeat echo later in hospitalization with EF up to 55% and mild RV dysfunction.  Thought to have stress/Takotsubo cardiomyopathy in setting of septic shock/cholecystitis. Co-ox has been ok. Has diuresed well. RHC after diuresis showed  mildly elevated filling pressures with moderate pulmonary hypertension.  PVR not particularly high due to relatively high cardiac output.  As PCWP was not markedly elevated, he was started on trial of sildenafil 20 mg tid.   - CVP 5 Volume status stable. Continue torsemide 40 mg daily.  - Continue Jardiance. - no UNNA boots w/ severe PAD. Continue w/ TED hose  2. Acute on Chronic Hypoxic Respiratory Failure due to end-stage OSA/OHS baseline pCO2 85-95 range. He does not have CPAP or Bipap at home.  Moderate pulmonary hypertension.  - On HFNC during the day and Bipap at night.  - Suspect truly end-stage. Need to consider tracheostomy but he refuses. - With controlled PCWP, we started him on trial of sildenafil 20 mg tid.  3. Atrial fibrillation: Chronic, rate controlled. H/o CVA.  Suspect tachy-brady syndrome, HR in 40s at times but asymptomatic with stable BP.  No PPM indication.  - Continue warfarin, INR remains therapeutic.   4. CAD: Anterior MI 1994, CABG 1997 with LIMA-LAD and seq SVG-OM/D.  Had PCI to proximal RCA in 2018.  No chest pain. EF has generally been in the 50% range  (45-50% on echo this admission). HS-TnI minimally elevated with no trend this admission, ACS is unlikely. No chest pain.  - Continue home statin.  - He is on warfarin so no ASA.  5. PAD: H/o L=>R fem-fem crossover grafting, had to be removed due to infection and had patch angioplasty.  6. AKI on CKD stage 3: Trending up 1.57>1.75 .  7. H/o acute cholecystitis: Has cholecystostomy tube from 11/21.  8. Contractile Alkalosis:  Improved, now off acetazolamide.  9. GOC: prognosis is poor. CCM & palliative care family meeting, continue full support with goal to get him discharged to home.  10. Hyponatremia: Sodium better today at 129 - Limit free water  - No tolvaptan today.  11. Deconditioning: Continue PT/OT, recommending SNF. Pt and family want home w/ [facility] services.    Can go home today. HF Meds Torsemide 40 mg daily  Sildenafil 20 mg tid  Jardiance 10 mg daily  Pravastatin 40 mg daily.   We will set up follow up. 06/15/20 at 1100  Length of Stay: 21  Amy Clegg, NP  06/10/2020, 8:53 AM  Advanced Heart Failure Team Pager [phone] (M-F; 7a - 4p)  Please contact [facility] for night-coverage after hours (4p -7a ) and weekends on [email or web address]  Patient seen with NP, agree with the above note.   Stable today, CVP 5.  Creatinine mildly higher at 1.7, Na up to 129.  Needs to continue limit free water intake.   He is stable for discharge from my standpoint.  See cardiac regimen noted above.  We will arrange followup in CHF clinic.  Will sign off, call over weekend if he remains in hospital and there are any questions.   Marca Ancona 06/10/2020 10:03 AM

## 2020-06-10 NOTE — Plan of Care (Signed)
  Problem: Activity: Goal: Capacity to carry out activities will improve Outcome: Progressing   Problem: Activity: Goal: Risk for activity intolerance will decrease Outcome: Progressing   Problem: Nutrition: Goal: Adequate nutrition will be maintained Outcome: Progressing

## 2020-06-10 NOTE — Progress Notes (Signed)
Patient continues to exhibit signs of hypercapnia associated with morbid obesity causing OHS and a restriction to the thoracic cage.  Interruption or failure to provide NIMV would quickly lead to exacerbation of the patient's condition, hospital admission, and likely harm to the patient. Continued use is preferred.  The use of the NIMV will treat patient's high PC02 levels, FVC, and FEV1 deficiencies and can reduce risk of exacerbations and future hospitalizations when used at night and during the day.  BiPAP has been considered and ruled out as ineffective in providing adequate ventilation to control the patient's hypercapnia.   PT will be required to have frequent durations of NIV with AVAPS-AE or iVAPS-AE and any interruption could lead to a decline in the patient's condition requiring readmission. 

## 2020-06-13 ENCOUNTER — Telehealth (HOSPITAL_COMMUNITY): Payer: Self-pay | Admitting: Licensed Clinical Social Worker

## 2020-06-13 ENCOUNTER — Encounter (HOSPITAL_COMMUNITY): Payer: Self-pay

## 2020-06-13 NOTE — Telephone Encounter (Addendum)
CSW called pt to assist with Extra Help application.  Unable to reach- goes straight to message stating VM isn't working  Pt has clinic appt Wednesday- put in appt note stating I need to see him so will attempt to follow up with pt then  Will continue to follow and assist as needed  Burna Sis, LCSW Clinical Social Worker Advanced Heart Failure Clinic Desk#: [phone] Cell#: [phone]

## 2020-06-14 NOTE — Progress Notes (Signed)
[facility] Clinic Note  PCP: Dr. Sarah Swaziland Primary Cardiologist: Dr. Allyson Sabal HF Cardiologist: Dr. Shirlee Latch  HPI: 80 y/o male w/ h/o obesity, OSA, chronic diastolic heart failure, pulmonary hypertension, systemic HTN, HLD, Stage III CKD, atrial fibrillation, T2DM and CAD w/ h/o CABG. Last LHC in 2018 showed patent SVG-OM with Y graft SVG to diagonal originating from mid portion of the OM graft. Moderate disease in the origin of the Y graft portion to the diagonal. Patent LIMA to LAD. Native RCA had 80% proximal stenosis treated w/ PCI + DES. Also w/ severe PAD w/ known occluded right external iliac artery, remote history of left to right femorofemoral crossover grafting  Had fairly recent hospitalization 11/21 for septic shock 2/2 acute cholecystitis c/b by PEA arrest occurring in the setting of percutaneous placement of a gallbladder drain. Noted to have severe biventricular dysfunction post-arrest. Echo 11/3 showed LVEF 20% (previously normal) + severe RV dysfunction. Hs trop had also peaked to1,364 post arrest, but felt 2/2 demand ischemia. He had f/u limited echo 11/13 that showed interval improvement w/ normalization of LVEF back to 55-60%. His RV was noted to be mildly enlarged w/ normal systolic function. G2DD noted.   Readmitted 05/20/20 for a/c CHF. Presented w/ several day history of SOB and bilateral LEE. COVID and flu negative. BNP 618. CXR showed edema. Hs trop low level and flat, 38>>40. Also w/ AKI. Scr 1.9 on admit (baseline ~1.2). Admitted IM and started on IV lasix. Echo repeated and showed slight drop in LVEF compared to prior study 11/13. LVEF mildly reduced 45-50% w/ RWMA w/ anteroseptal hypokinesis. RV systolic function noted to be normal but  the interventricular septum is flattened in systole and diastole, consistent with right ventricular pressure and volume overload. Severely elevated pulmonary artery systolic pressure noted, 63 mmHg.   Morning of 05/22/2020  patient suffered brief respiratory arrest and placed on BiPAP. Heart failure was consulted and he was placed on lasix gtt with monitoring of CVP in addition to intermittent metolazone. Repeat echo showed EF 45-50% with anteroseptal hypokinesis, elevated pulmonary pressures and RV failure. Goals of care discussion was held with family opting to continue full scope of care. Right heart cath was done, which showed mildly elevated filling pressures and moderate pulmonary hypertension. He was started on sildenafil as pulmonary capillary wedge pressure was not markedly elevated. With diuresis respiratory status improved slowly and was complicated by hyponatremia and metabolic acidosis. Metabolic alkalosis was treated with diamox. Hyponatremia was treated with tolvaptam and cessation of metolazone and diamox.  Renal function improved with diuresis, and he was started on spironolactone. He initially had intermittent atrial fibrillation with bradycardia throughout admission. This transitioned to consistent atrial fibrillation with bradycardia, thought to possibly be tachy-brady. He remained asymptomatic throughout admission. Patient has history of chronic pulmonary effusions, which were worse at admission. These improved with diuresis prior to discharge. Patient worked with physical therapy and occupational therapy and was able to wean to ~2L supplemental oxygen. He was discharged home with family and will have 24 hour supervision. He will have home Trilogy Ventilator to use bipap every evening. New discharge medications include torsemide 40 mg qd, sildenafil 20 mg tid, jardiance 10 mg qd. Discharge weight 89.3 kg.  Today he returns for post-hospital follow up with his granddaughter and grandson. Has not been on sildenafil due to cost. Overall feeling fine, but weak. Does get SOB with minimal activity. HH PT/OT to start soon. Wears 2L continuously & BiPap at night. Denies  increasing SOB, CP, dizziness, edema, or  PND/Orthopnea. Appetite ok. No fever or chills. Weight at home ~192 pounds. Taking all medications.   ECG today (personally reviewed): atrial fibrillation w/ PVCs, 57 bpm  Cardiac Studies: RHC 1/22 RHC Procedural Findings: Hemodynamics (mmHg) RA mean 9 RV 59/10 PA 60/22, mean 36 PCWP mean 16 Oxygen saturations: PA 70% AO 98% Cardiac Output (Fick) 7.8  Cardiac Index (Fick) 3.7 PVR 2.6 WU 1. Mildly elevated right and left heart filling pressures.  2. Moderate pulmonary hypertension, PVR not high due to relatively high cardiac output.   Echo (1/22): EF 45-50%, anteroseptal HK, D-shaped septum with PASP 63 mmHg and dilated IVC Echo (03/26/20): EF 55% and mild RV dysfunction. Thought to have stress/Takotsubo cardiomyopathy in setting of septic shock/cholecystitis Echo (03/16/20): EF 20% with severe RV dysfunction. LHC (3/18):  Ost Cx to Prox Cx lesion, 100 %stenosed. SVG to OM patent, with Y graft SVG to diagonal originating from mid portion of the OM graft. Moderate disease in the origin of the Y graft portion to the diagonal.  Mid LAD lesion, 70 %stenosed. Dist LAD lesion, 100 %stenosed. LIMA to LAD is widely patent.  Ost RCA to Prox RCA lesion, 80 %stenosed. A STENT XIENCE ALPINE RX [id number] drug eluting stent was successfully placed.  Post intervention, there is a 0% residual stenosis.  LV end diastolic pressure is moderately elevated.  There is no aortic valve stenosis. Continue clopidogrel for at least a year.  Will likely be on a combination of Coumadin and clopidogrel going forward. Continue aggressive secondary prevention.  ROS: All systems negative except as listed in HPI, PMH and Problem List. SH:  Social History   Socioeconomic History  . Marital status: Divorced    Spouse name: Not on file  . Number of children: Not on file  . Years of education: Not on file  . Highest education level: Not on file  Occupational History  . Not on file  Tobacco Use  . Smoking  status: Former Smoker    Types: Cigarettes    Quit date: 05/14/1978    Years since quitting: 42.1  . Smokeless tobacco: Never Used  Substance and Sexual Activity  . Alcohol use: No  . Drug use: No  . Sexual activity: Not on file  Other Topics Concern  . Not on file  Social History Narrative  . Not on file   Social Determinants of Health   Financial Resource Strain: Not on file  Food Insecurity: Not on file  Transportation Needs: Not on file  Physical Activity: Not on file  Stress: Not on file  Social Connections: Not on file  Intimate Partner Violence: Not on file   FH:  Family History  Problem Relation Age of Onset  . Other Mother        varicose veins  . Heart disease Mother   . Hyperlipidemia Mother   . Hypertension Mother   . Varicose Veins Mother   . Cancer Father   . Diabetes Father   . Heart disease Father        before age 53  . Hyperlipidemia Father   . Hypertension Father   . Other Father        varicose veins  . Heart attack Father   . Diabetes Daughter   . Hyperlipidemia Daughter   . Hypertension Daughter   . Other Daughter        varicose veins  . Varicose Veins Daughter   . Hypertension Son   .  Diabetes Sister   . Heart disease Sister        DVT  . Other Sister        varicose veins  . Hyperlipidemia Sister   . Hypertension Sister   . Varicose Veins Sister   . Other Brother        varicose veins  . Hyperlipidemia Brother   . Hypertension Brother   . Heart attack Brother     Past Medical History:  Diagnosis Date  . Atrial fibrillation (HCC)    on Coumadin  . CAD (coronary artery disease)    s/p remote CABG, stent in 2019  . CHF (congestive heart failure) (HCC)   . Dementia (HCC)   . Diabetes mellitus   . DVT (deep venous thrombosis) (HCC)   . Fall at home 10/2015  . Hyperlipidemia   . Hypertension   . Left-sided carotid artery disease (HCC)   . MRSA infection   . Myocardial infarction (HCC)   . Obesity   . Peripheral vascular  disease (HCC)   . Sleep apnea   . Stroke (HCC)   . Stroke (HCC)   . Venous insufficiency     Current Outpatient Medications  Medication Sig Dispense Refill  . acetaminophen (TYLENOL) 500 MG tablet Take 500-1,000 mg by mouth every 6 (six) hours as needed for mild pain or headache.    . allopurinol (ZYLOPRIM) 100 MG tablet TAKE 2 TABLETS BY MOUTH DAILY. 30 tablet 0  . colchicine 0.6 MG tablet Take 0.6 mg by mouth daily as needed (gout attacks).    Marland Kitchen docusate sodium (COLACE) 100 MG capsule Take 1 capsule (100 mg total) by mouth 2 (two) times daily. 10 capsule 0  . empagliflozin (JARDIANCE) 10 MG TABS tablet Take 1 tablet (10 mg total) by mouth daily. 14 tablet 0  . ferrous sulfate 325 (65 FE) MG tablet Take 1 tablet (325 mg total) by mouth 2 (two) times daily with a meal. 90 tablet 0  . gabapentin (NEURONTIN) 300 MG capsule Take 1 capsule (300 mg total) by mouth 3 (three) times daily. Take 300 mg by mouth three times a day and 600 mg at bedtime 90 capsule 0  . glipiZIDE (GLUCOTROL) 5 MG tablet Take 1 tablet (5 mg total) by mouth daily before breakfast. 30 tablet 0  . Multiple Vitamin (MULTIVITAMIN WITH MINERALS) TABS tablet Take 1 tablet by mouth daily.    . pantoprazole (PROTONIX) 40 MG tablet Take 40 mg by mouth 2 (two) times daily before a meal.     . potassium chloride (K-DUR) 10 MEQ tablet Take 1 tablet (10 mEq total) by mouth daily. 30 tablet 5  . pravastatin (PRAVACHOL) 40 MG tablet Take 40 mg by mouth at bedtime.     . sodium chloride flush (NS) 0.9 % SOLN Instill 5 mL daily into percutaneous drain daily x 8 weeks. 56 Syringe 0  . torsemide (DEMADEX) 20 MG tablet Take 2 tablets (40 mg total) by mouth daily. 60 tablet 0  . warfarin (COUMADIN) 1 MG tablet Take 1 mg by mouth daily. Taking with the 5mg  = 6mg  daily    . warfarin (COUMADIN) 5 MG tablet Take 5 mg by mouth daily with supper. Taking with the 1mg  = 6mg  daily, unless directed differently by clinic     No current  facility-administered medications for this encounter.    Vitals:   06/15/20 1117  BP: 110/70  Pulse: (!) 51  SpO2: 99%  Weight: 87.1 kg (192 lb)  Wt Readings from Last 3 Encounters:  06/15/20 87.1 kg (192 lb)  06/10/20 89.3 kg (196 lb 13.9 oz)  03/24/20 98.4 kg (216 lb 14.9 oz)   PHYSICAL EXAM: General:  Elderly, frail appearing, in wheelchair. NAD. No resp difficulty HEENT: Normal Neck: Supple. No JVD. Carotids 2+ bilat; no bruits. No lymphadenopathy or thryomegaly appreciated. Cor: PMI nondisplaced. Slow rate & irregularly irregular rhythm. No rubs, gallops or murmurs. Lungs: on oxygen, cslear, faint crackles LLL Abdomen: Soft, nontender, nondistended. No hepatosplenomegaly. No bruits or masses. Good bowel sounds. RUQ chole tube. Extremities: No cyanosis, clubbing, rash, edema Neuro: alert & oriented x 3, cranial nerves grossly intact. Moves all 4 extremities w/o difficulty. Affect pleasant.  ASSESSMENT & PLAN: 1. Chronic heart failure with mid-range EF and severe RV failure:  Echo this admission with EF 45-50%, anteroseptal HK, D-shaped septum with PASP 63 mmHg and dilated IVC. During 11/21 admission, initial echo showed EF 20% with severe RV dysfunction. Repeat echo later in hospitalization with EF up to 55% and mild RV dysfunction. Thought to have stress/Takotsubo cardiomyopathy in setting of septic shock/cholecystitis. He diuresed well. RHC after diuresis showed mildly elevated filling pressures with moderate pulmonary hypertension.  PVR not particularly high due to relatively high cardiac output.  As PCWP was not markedly elevated, he was started on trial of sildenafil 20 mg tid.   - NYHA IIIb. Volume status stable.  - Continue torsemide 40 mg daily.  - Continue Jardiance 10 mg. No GU symptoms - no UNNA boots w/ severe PAD. Continue w/ TED hose . - BMET today. 2. Chronic Hypoxic Respiratory Failure due to end-stage OSA/OHS baseline pCO2 85-95 range. Moderate pulmonary  hypertension.  - On home Trilogy Ventilator to use bipap every evening.  - Suspect truly end-stage. Need to consider tracheostomy but he refuses. - With controlled PCWP, will ask pharmacy/[facility] SW to assist to get sildenafil 20 mg tid covered (Good Rx card given).  3. Atrial fibrillation: Chronic, rate controlled. H/o CVA.  Suspect tachy-brady syndrome, HR in 40s at times but asymptomatic with stable BP.  No PPM indication.  - Continue warfarin, INR remains therapeutic.   - [facility]R [facility]. No bleeding issues. 4. CAD: Anterior MI 1994, CABG 1997 with LIMA-LAD and seq SVG-OM/D. Had PCI to proximal RCA in 2018. No chest pain. EF has generally been in the 50% range (45-50% on echo this admission). No chest pain.  - Continue home statin.  - He is on warfarin so no ASA.  5. PAD: H/o L=>R fem-fem crossover grafting, had to be removed due to infection and had patch angioplasty.  6. CKD stage 3: Trending up 1.57>1.75. BMET today. 7. H/o acute cholecystitis: Has cholecystostomy tube from 11/21. 8. GOC: prognosis is poor. CCM & palliative care arranged to get him discharged to home with [facility].  9. Hyponatremia: Sodium 129 at discharge. - Limit free water  - BMET today.  10. Deconditioning: PT/OT recommended SNF, he is home with home health services. PT to start tomorrow.  Follow up with Dr. Shirlee Latch in 1- 2 months.  Prince Rome, FNP-BC 06/15/20

## 2020-06-15 ENCOUNTER — Encounter (HOSPITAL_COMMUNITY): Payer: Self-pay

## 2020-06-15 ENCOUNTER — Ambulatory Visit (HOSPITAL_COMMUNITY)
Admit: 2020-06-15 | Discharge: 2020-06-15 | Disposition: A | Discharge status: Home / Self Care | Payer: Medicare Other | Attending: Family Medicine | Admitting: Family Medicine

## 2020-06-15 ENCOUNTER — Other Ambulatory Visit: Payer: Self-pay

## 2020-06-15 VITALS — BP 110/70 | HR 51 | Wt 192.0 lb

## 2020-06-15 DIAGNOSIS — N183 Chronic kidney disease, stage 3 unspecified: Secondary | ICD-10-CM | POA: Diagnosis not present

## 2020-06-15 DIAGNOSIS — Z9049 Acquired absence of other specified parts of digestive tract: Secondary | ICD-10-CM | POA: Diagnosis not present

## 2020-06-15 DIAGNOSIS — Z7901 Long term (current) use of anticoagulants: Secondary | ICD-10-CM | POA: Diagnosis not present

## 2020-06-15 DIAGNOSIS — E1122 Type 2 diabetes mellitus with diabetic chronic kidney disease: Secondary | ICD-10-CM | POA: Diagnosis not present

## 2020-06-15 DIAGNOSIS — I272 Pulmonary hypertension, unspecified: Secondary | ICD-10-CM | POA: Diagnosis not present

## 2020-06-15 DIAGNOSIS — Z87891 Personal history of nicotine dependence: Secondary | ICD-10-CM | POA: Insufficient documentation

## 2020-06-15 DIAGNOSIS — I2510 Atherosclerotic heart disease of native coronary artery without angina pectoris: Secondary | ICD-10-CM | POA: Diagnosis not present

## 2020-06-15 DIAGNOSIS — G4733 Obstructive sleep apnea (adult) (pediatric): Secondary | ICD-10-CM | POA: Diagnosis not present

## 2020-06-15 DIAGNOSIS — Z8719 Personal history of other diseases of the digestive system: Secondary | ICD-10-CM | POA: Diagnosis not present

## 2020-06-15 DIAGNOSIS — E871 Hypo-osmolality and hyponatremia: Secondary | ICD-10-CM | POA: Insufficient documentation

## 2020-06-15 DIAGNOSIS — I252 Old myocardial infarction: Secondary | ICD-10-CM | POA: Insufficient documentation

## 2020-06-15 DIAGNOSIS — I5022 Chronic systolic (congestive) heart failure: Secondary | ICD-10-CM

## 2020-06-15 DIAGNOSIS — I739 Peripheral vascular disease, unspecified: Secondary | ICD-10-CM

## 2020-06-15 DIAGNOSIS — Z9981 Dependence on supplemental oxygen: Secondary | ICD-10-CM | POA: Insufficient documentation

## 2020-06-15 DIAGNOSIS — Z951 Presence of aortocoronary bypass graft: Secondary | ICD-10-CM | POA: Insufficient documentation

## 2020-06-15 DIAGNOSIS — I4811 Longstanding persistent atrial fibrillation: Secondary | ICD-10-CM

## 2020-06-15 DIAGNOSIS — J9611 Chronic respiratory failure with hypoxia: Secondary | ICD-10-CM

## 2020-06-15 DIAGNOSIS — Z79899 Other long term (current) drug therapy: Secondary | ICD-10-CM | POA: Diagnosis not present

## 2020-06-15 DIAGNOSIS — Z7984 Long term (current) use of oral hypoglycemic drugs: Secondary | ICD-10-CM | POA: Diagnosis not present

## 2020-06-15 DIAGNOSIS — I745 Embolism and thrombosis of iliac artery: Secondary | ICD-10-CM | POA: Diagnosis not present

## 2020-06-15 DIAGNOSIS — I482 Chronic atrial fibrillation, unspecified: Secondary | ICD-10-CM | POA: Insufficient documentation

## 2020-06-15 DIAGNOSIS — R5381 Other malaise: Secondary | ICD-10-CM

## 2020-06-15 DIAGNOSIS — N1832 Chronic kidney disease, stage 3b: Secondary | ICD-10-CM

## 2020-06-15 DIAGNOSIS — E785 Hyperlipidemia, unspecified: Secondary | ICD-10-CM | POA: Diagnosis not present

## 2020-06-15 DIAGNOSIS — I5032 Chronic diastolic (congestive) heart failure: Secondary | ICD-10-CM | POA: Diagnosis not present

## 2020-06-15 DIAGNOSIS — E1151 Type 2 diabetes mellitus with diabetic peripheral angiopathy without gangrene: Secondary | ICD-10-CM | POA: Diagnosis not present

## 2020-06-15 DIAGNOSIS — I130 Hypertensive heart and chronic kidney disease with heart failure and stage 1 through stage 4 chronic kidney disease, or unspecified chronic kidney disease: Secondary | ICD-10-CM | POA: Insufficient documentation

## 2020-06-15 DIAGNOSIS — Z7189 Other specified counseling: Secondary | ICD-10-CM

## 2020-06-15 LAB — BASIC METABOLIC PANEL
Anion gap: 10 (ref 5–15)
BUN: 46 mg/dL — ABNORMAL HIGH (ref 8–23)
CO2: 33 mmol/L — ABNORMAL HIGH (ref 22–32)
Calcium: 9.5 mg/dL (ref 8.9–10.3)
Chloride: 99 mmol/L (ref 98–111)
Creatinine, Ser: 1.66 mg/dL — ABNORMAL HIGH (ref 0.61–1.24)
GFR, Estimated: 42 mL/min — ABNORMAL LOW (ref >60)
Glucose, Bld: 166 mg/dL — ABNORMAL HIGH (ref 70–99)
Potassium: 4 mmol/L (ref 3.5–5.1)
Sodium: 142 mmol/L (ref 135–145)

## 2020-06-15 MED ORDER — SILDENAFIL CITRATE 20 MG PO TABS: 20.0000 mg | ORAL_TABLET | Freq: Three times a day (TID) | ORAL | 11 refills | Status: DC

## 2020-06-15 NOTE — Patient Instructions (Signed)
Labs today We will only contact you if something comes back abnormal or we need to make some changes. Otherwise no news is good news!  Your physician recommends that you schedule a follow-up appointment in: 1-2 months with Dr Shirlee Latch  If you have any questions or concerns before your next appointment please send [place] a message through [facility] or call our office at [phone].    TO LEAVE A MESSAGE FOR THE NURSE SELECT OPTION 2, PLEASE LEAVE A MESSAGE INCLUDING: . YOUR NAME . DATE OF BIRTH . CALL BACK NUMBER . REASON FOR CALL**this is important as we prioritize the call backs  YOU WILL RECEIVE A CALL BACK THE SAME DAY AS LONG AS YOU CALL BEFORE 4:00 PM At the [facility], you and your health needs are our priority. As part of our continuing mission to provide you with exceptional heart care, we have created designated Provider Care Teams. These Care Teams include your primary Cardiologist (physician) and Advanced Practice Providers (APPs- Physician Assistants and Nurse Practitioners) who all work together to provide you with the care you need, when you need it.   You may see any of the following providers on your designated Care Team at your next follow up: Marland Kitchen Dr Arvilla Meres . Dr Marca Ancona . Tonye Becket, NP . Robbie Lis, PA . Shanda Bumps [place],NP . Karle Plumber, PharmD   Please be sure to bring in all your medications bottles to every appointment.

## 2020-06-17 ENCOUNTER — Telehealth (HOSPITAL_COMMUNITY): Payer: Self-pay | Admitting: Licensed Clinical Social Worker

## 2020-06-17 NOTE — Telephone Encounter (Signed)
CSW reached out to pt daughter, Lupita Leash, to complete Extra Help application- unable to reach- left VM requesting return call  Will continue to follow and assist as needed  Burna Sis, LCSW Clinical Social Worker [facility] Desk#: [phone] Cell#: [phone]

## 2020-06-17 NOTE — Telephone Encounter (Signed)
Pt daughter returned call and CSW assisted with completing Extra Help application.  Informed dtr to look out for notice in 3-4 weeks.  If pt income and assets are accurate should be approved for full Extra Help but does not appear as if pt has part D coverage so unsure how Extra Help will work in this instance.  CSW provided pt pharmacy advocate number to dtr and told her to call once she receives the letter.  Will continue to follow and assist as needed  Burna Sis, LCSW Clinical Social Worker [facility] Desk#: [phone] Cell#: [phone]

## 2020-06-21 ENCOUNTER — Telehealth (HOSPITAL_COMMUNITY): Payer: Self-pay | Admitting: *Deleted

## 2020-06-21 MED ORDER — POTASSIUM CHLORIDE ER 10 MEQ PO TBCR: 20.0000 meq | EXTENDED_RELEASE_TABLET | Freq: Every day | ORAL | 5 refills | Status: AC

## 2020-06-21 MED ORDER — TORSEMIDE 20 MG PO TABS: 40.0000 mg | ORAL_TABLET | Freq: Two times a day (BID) | ORAL | 0 refills | Status: DC

## 2020-06-21 NOTE — Telephone Encounter (Signed)
Lyla Son [facility] called to report pts weight increased to 208lbs from 193lbs at last office visit. Pt lowered fluid intake and his weight is now 201lbs but has shortness of breath and crackles. Per Amy Clegg,NP increase torsemide to 40mg  bid and k to daily needs office visit next week. Carrie aware. I called pt to schedule office visit no answer no vm set up.

## 2020-06-27 NOTE — Telephone Encounter (Signed)
2nd attempt to reach pt to schedule office visit no answer/no vm set up

## 2020-07-01 MED ORDER — TORSEMIDE 20 MG PO TABS: 40.0000 mg | ORAL_TABLET | Freq: Two times a day (BID) | ORAL | 1 refills | Status: DC

## 2020-07-05 ENCOUNTER — Ambulatory Visit (INDEPENDENT_AMBULATORY_CARE_PROVIDER_SITE_OTHER): Payer: Medicare Other | Admitting: Sports Medicine

## 2020-07-05 ENCOUNTER — Encounter: Payer: Self-pay | Admitting: Sports Medicine

## 2020-07-05 ENCOUNTER — Other Ambulatory Visit: Payer: Self-pay

## 2020-07-05 DIAGNOSIS — Z87828 Personal history of other (healed) physical injury and trauma: Secondary | ICD-10-CM

## 2020-07-05 DIAGNOSIS — I2510 Atherosclerotic heart disease of native coronary artery without angina pectoris: Secondary | ICD-10-CM | POA: Diagnosis not present

## 2020-07-05 DIAGNOSIS — L97512 Non-pressure chronic ulcer of other part of right foot with fat layer exposed: Secondary | ICD-10-CM

## 2020-07-05 DIAGNOSIS — L02619 Cutaneous abscess of unspecified foot: Secondary | ICD-10-CM | POA: Diagnosis not present

## 2020-07-05 DIAGNOSIS — G629 Polyneuropathy, unspecified: Secondary | ICD-10-CM

## 2020-07-05 DIAGNOSIS — L089 Local infection of the skin and subcutaneous tissue, unspecified: Secondary | ICD-10-CM

## 2020-07-05 DIAGNOSIS — L03115 Cellulitis of right lower limb: Secondary | ICD-10-CM

## 2020-07-05 DIAGNOSIS — E1159 Type 2 diabetes mellitus with other circulatory complications: Secondary | ICD-10-CM

## 2020-07-05 DIAGNOSIS — L97912 Non-pressure chronic ulcer of unspecified part of right lower leg with fat layer exposed: Secondary | ICD-10-CM | POA: Diagnosis not present

## 2020-07-05 DIAGNOSIS — Z7409 Other reduced mobility: Secondary | ICD-10-CM | POA: Insufficient documentation

## 2020-07-05 MED ORDER — MEDIHONEY WOUND/BURN DRESSING EX GEL: CUTANEOUS | 1 refills | Status: AC

## 2020-07-05 MED ORDER — POVIDONE-IODINE 10 % EX SOLN: 1.0000 "application " | CUTANEOUS | 0 refills | Status: DC | PRN
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
## 2020-07-06 ENCOUNTER — Telehealth: Payer: Self-pay | Admitting: *Deleted

## 2020-07-06 NOTE — Telephone Encounter (Signed)
I faxed over the verbal order to [facility] and the fax number is [phone]. Misty Stanley

## 2020-07-06 NOTE — Telephone Encounter (Signed)
-----   Message from Asencion Islam, [place] sent at 07/05/2020  8:41 PM EST ----- Regarding: Home Nursing Wound care orders I presume [facility]se ulcerations to right lower leg and foot using a saline moistened guaze to remove any loose skin at wound beds, apply betadine to the toes and to the periwound areas, to the wound beds apply medihoney and adpatic all covered by dry dressing. Recommend dressing changes 2-3x per week, may teach daughter how to dress as well. Thanks Dr. Marylene Land

## 2020-07-09 ENCOUNTER — Other Ambulatory Visit: Payer: Self-pay | Admitting: Sports Medicine

## 2020-07-09 LAB — WOUND CULTURE

## 2020-07-09 MED ORDER — CIPROFLOXACIN HCL 500 MG PO TABS: 500.0000 mg | ORAL_TABLET | Freq: Two times a day (BID) | ORAL | 0 refills | Status: DC

## 2020-07-09 NOTE — Progress Notes (Signed)
Wound culture + sent Cipro to pharmacy

## 2020-07-12 ENCOUNTER — Telehealth: Payer: Self-pay | Admitting: *Deleted

## 2020-07-12 NOTE — Telephone Encounter (Signed)
-----   Message from Asencion Islam, [place] sent at 07/09/2020 11:06 PM EST ----- Please let patient's daughter know that I sent Cipro to [facility] for + wound culture

## 2020-07-12 NOTE — Telephone Encounter (Signed)
Called and left a message for the daughter Lupita Leash) and relayed the message per Dr Marylene Land. Misty Stanley

## 2020-07-15 ENCOUNTER — Ambulatory Visit: Payer: Medicare Other | Admitting: Sports Medicine

## 2020-07-15 ENCOUNTER — Telehealth (HOSPITAL_COMMUNITY): Payer: Self-pay | Admitting: Licensed Clinical Social Worker

## 2020-07-15 NOTE — Telephone Encounter (Signed)
CSW called pt dtr to check in regarding Extra Help application status.  Left VM requesting return call  Burna Sis, LCSW Clinical Social Worker [facility] Desk#: [phone] Cell#: [phone]

## 2020-07-19 ENCOUNTER — Ambulatory Visit (INDEPENDENT_AMBULATORY_CARE_PROVIDER_SITE_OTHER): Payer: Medicare Other | Admitting: Sports Medicine

## 2020-07-19 ENCOUNTER — Other Ambulatory Visit: Payer: Self-pay

## 2020-07-19 ENCOUNTER — Encounter: Payer: Self-pay | Admitting: Sports Medicine

## 2020-07-19 DIAGNOSIS — I2510 Atherosclerotic heart disease of native coronary artery without angina pectoris: Secondary | ICD-10-CM | POA: Diagnosis not present

## 2020-07-19 DIAGNOSIS — L02619 Cutaneous abscess of unspecified foot: Secondary | ICD-10-CM

## 2020-07-19 DIAGNOSIS — L97912 Non-pressure chronic ulcer of unspecified part of right lower leg with fat layer exposed: Secondary | ICD-10-CM | POA: Diagnosis not present

## 2020-07-19 DIAGNOSIS — L03119 Cellulitis of unspecified part of limb: Secondary | ICD-10-CM

## 2020-07-19 DIAGNOSIS — Z87828 Personal history of other (healed) physical injury and trauma: Secondary | ICD-10-CM

## 2020-07-19 DIAGNOSIS — L97512 Non-pressure chronic ulcer of other part of right foot with fat layer exposed: Secondary | ICD-10-CM | POA: Diagnosis not present

## 2020-07-19 DIAGNOSIS — G629 Polyneuropathy, unspecified: Secondary | ICD-10-CM

## 2020-07-19 DIAGNOSIS — E1159 Type 2 diabetes mellitus with other circulatory complications: Secondary | ICD-10-CM
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
## 2020-08-02 ENCOUNTER — Ambulatory Visit: Payer: Medicare Other | Admitting: Sports Medicine

## 2020-08-16 ENCOUNTER — Encounter (HOSPITAL_COMMUNITY): Payer: Medicare Other | Admitting: Cardiology

## 2020-08-17 ENCOUNTER — Ambulatory Visit: Payer: Medicare Other | Admitting: Sports Medicine

## 2020-09-06 ENCOUNTER — Ambulatory Visit: Payer: Medicare Other | Admitting: Sports Medicine

## 2020-09-11 ENCOUNTER — Emergency Department (HOSPITAL_COMMUNITY): Payer: Medicare Other

## 2020-09-11 ENCOUNTER — Encounter (HOSPITAL_COMMUNITY): Payer: Self-pay | Admitting: Emergency Medicine

## 2020-09-11 ENCOUNTER — Emergency Department (HOSPITAL_COMMUNITY)
Admission: EM | Admit: 2020-09-11 | Discharge: 2020-09-11 | Disposition: A | Discharge status: Home / Self Care | Payer: Medicare Other | Attending: Emergency Medicine | Admitting: Emergency Medicine

## 2020-09-11 DIAGNOSIS — F0390 Unspecified dementia without behavioral disturbance: Secondary | ICD-10-CM | POA: Insufficient documentation

## 2020-09-11 DIAGNOSIS — I5032 Chronic diastolic (congestive) heart failure: Secondary | ICD-10-CM | POA: Insufficient documentation

## 2020-09-11 DIAGNOSIS — S39012A Strain of muscle, fascia and tendon of lower back, initial encounter: Secondary | ICD-10-CM

## 2020-09-11 DIAGNOSIS — Z7901 Long term (current) use of anticoagulants: Secondary | ICD-10-CM | POA: Diagnosis not present

## 2020-09-11 DIAGNOSIS — S50811A Abrasion of right forearm, initial encounter: Secondary | ICD-10-CM | POA: Insufficient documentation

## 2020-09-11 DIAGNOSIS — Z951 Presence of aortocoronary bypass graft: Secondary | ICD-10-CM | POA: Insufficient documentation

## 2020-09-11 DIAGNOSIS — Z23 Encounter for immunization: Secondary | ICD-10-CM | POA: Insufficient documentation

## 2020-09-11 DIAGNOSIS — Z87891 Personal history of nicotine dependence: Secondary | ICD-10-CM | POA: Diagnosis not present

## 2020-09-11 DIAGNOSIS — M545 Low back pain, unspecified: Secondary | ICD-10-CM | POA: Diagnosis not present

## 2020-09-11 DIAGNOSIS — I2510 Atherosclerotic heart disease of native coronary artery without angina pectoris: Secondary | ICD-10-CM | POA: Insufficient documentation

## 2020-09-11 DIAGNOSIS — I482 Chronic atrial fibrillation, unspecified: Secondary | ICD-10-CM | POA: Diagnosis not present

## 2020-09-11 DIAGNOSIS — I130 Hypertensive heart and chronic kidney disease with heart failure and stage 1 through stage 4 chronic kidney disease, or unspecified chronic kidney disease: Secondary | ICD-10-CM | POA: Diagnosis not present

## 2020-09-11 DIAGNOSIS — Z7984 Long term (current) use of oral hypoglycemic drugs: Secondary | ICD-10-CM | POA: Insufficient documentation

## 2020-09-11 DIAGNOSIS — D631 Anemia in chronic kidney disease: Secondary | ICD-10-CM | POA: Diagnosis not present

## 2020-09-11 DIAGNOSIS — W010XXA Fall on same level from slipping, tripping and stumbling without subsequent striking against object, initial encounter: Secondary | ICD-10-CM | POA: Insufficient documentation

## 2020-09-11 DIAGNOSIS — N183 Chronic kidney disease, stage 3 unspecified: Secondary | ICD-10-CM | POA: Diagnosis not present

## 2020-09-11 DIAGNOSIS — E1159 Type 2 diabetes mellitus with other circulatory complications: Secondary | ICD-10-CM | POA: Diagnosis not present

## 2020-09-11 DIAGNOSIS — S59911A Unspecified injury of right forearm, initial encounter: Secondary | ICD-10-CM | POA: Diagnosis present

## 2020-09-11 MED ORDER — TRAMADOL HCL 50 MG PO TABS
50.0000 mg | ORAL_TABLET | Freq: Once | ORAL | Status: AC
  Administered 2020-09-11: 50 mg via ORAL
  Filled 2020-09-11: qty 1

## 2020-09-11 MED ORDER — TRAMADOL HCL 50 MG PO TABS: 50.0000 mg | ORAL_TABLET | Freq: Once | ORAL | Status: DC

## 2020-09-11 MED ORDER — TETANUS-DIPHTH-ACELL PERTUSSIS 5-2.5-18.5 LF-MCG/0.5 IM SUSY
0.5000 mL | PREFILLED_SYRINGE | Freq: Once | INTRAMUSCULAR | Status: AC
  Administered 2020-09-11: 0.5 mL via INTRAMUSCULAR
  Filled 2020-09-11: qty 0.5

## 2020-09-11 MED ORDER — TRAMADOL HCL 50 MG PO TABS: 50.0000 mg | ORAL_TABLET | Freq: Three times a day (TID) | ORAL | 0 refills | Status: DC | PRN

## 2020-09-11 NOTE — ED Provider Notes (Signed)
[facility] Provider Note   CSN: [id number] Arrival date & time: 09/11/20  1048     History Chief Complaint  Patient presents with  . Back Pain    Isaiah [name] is a 80 y.o. male.  Patient s/p fall ~ 1 week ago on pool deck, tripped, mechanical fall onto bottom, with acute onset low back pain post fall, dull, moderate-sev, constant, worse w certain movements/positional changes, non radiating/non radicular. No associated saddle area or leg numbness. No weakness. No problems w normal bowel or bladder function. Walks w walker, and has remained ambulatory post fall. No anticoag use - had been on warfarin for afib up until a couple months ago - had head bleed and off anticoag therapy since. Denies any faintness or dizziness prior to fall. No loc w fall. No headache. No neck or upper back pain. At time of fall, also skin tear/abrasions to right forearm, tetanus unknown. Denies extremity pain.   The history is provided by the patient.  Back Pain Associated symptoms: no abdominal pain, no chest pain, no fever, no headaches, no numbness and no weakness        Past Medical History:  Diagnosis Date  . Atrial fibrillation (HCC)    on Coumadin  . CAD (coronary artery disease)    s/p remote CABG, stent in 2019  . CHF (congestive heart failure) (HCC)   . Dementia (HCC)   . Diabetes mellitus   . DVT (deep venous thrombosis) (HCC)   . Fall at home 10/2015  . Hyperlipidemia   . Hypertension   . Left-sided carotid artery disease (HCC)   . MRSA infection   . Myocardial infarction (HCC)   . Obesity   . Peripheral vascular disease (HCC)   . Sleep apnea   . Stroke (HCC)   . Stroke (HCC)   . Venous insufficiency     Patient Active Problem List   Diagnosis Date Noted  . Impaired functional mobility, balance, gait, and endurance 07/05/2020  . Acute on chronic respiratory failure with hypoxia and hypercapnia (HCC)   . Acute exacerbation of congestive  heart failure (HCC) 05/20/2020  . Biventricular failure (HCC)   . Anemia due to GI blood loss   . Acute systolic heart failure (HCC)   . Shock (HCC)   . Cardiac arrest (HCC)   . Sepsis due to undetermined organism (HCC) 03/14/2020  . Acute cholecystitis 03/14/2020  . Class 1 obesity due to excess calories with body mass index (BMI) of 33.0 to 33.9 in adult 03/14/2020  . Compression fracture of thoracic vertebra, initial encounter (HCC) 12/31/2018  . Pressure injury of skin 12/15/2018  . Thoracic spine fracture (HCC) 12/12/2018  . Chronic gout 10/12/2017  . Chronic diastolic congestive heart failure (HCC) 10/11/2017  . Hyponatremia 08/22/2016  . AKI (acute kidney injury) (HCC) 08/21/2016  . Hypokalemia 08/11/2016  . Occult GI bleeding 08/11/2016  . General weakness 08/11/2016  . GI bleed 08/11/2016  . Weakness   . Non-ST elevation (NSTEMI) myocardial infarction (HCC) 08/08/2016  . Unstable angina (HCC) 08/06/2016  . Chest pain 08/05/2016  . CKD (chronic kidney disease), stage III (HCC) 08/05/2016  . Normochromic normocytic anemia 08/05/2016  . Pain in joint, lower leg 10/01/2014  . Coronary artery disease 04/27/2014  . Chronic atrial fibrillation (HCC) 04/27/2014  . Essential hypertension 04/27/2014  . Hyperlipidemia 04/27/2014  . Type 2 diabetes mellitus with vascular disease (HCC) 04/27/2014  . Obstructive sleep apnea 04/27/2014  . Stroke [facility](HCC)  04/27/2014  . On continuous oral anticoagulation 04/27/2014  . Occlusion and stenosis of carotid artery without mention of cerebral infarction 02/04/2013  . Aftercare following surgery of the circulatory system, NEC 01/09/2012  . Chronic total occlusion of artery of the extremities (HCC) 01/09/2012    Past Surgical History:  Procedure Laterality Date  . CORONARY ARTERY BYPASS GRAFT  1997   vein harvest from right leg  . CORONARY STENT INTERVENTION N/A 08/07/2016   Procedure: Coronary Stent Intervention;  Surgeon: Corky Crafts, MD;  Location: [facility] INVASIVE CV LAB;  Service: Cardiovascular;  Laterality: N/A;  RCA  . ESOPHAGOGASTRODUODENOSCOPY N/A 08/12/2016   Procedure: ESOPHAGOGASTRODUODENOSCOPY (EGD);  Surgeon: Carman Ching, MD;  Location: [facility] ENDOSCOPY;  Service: Endoscopy;  Laterality: N/A;  . ESOPHAGOGASTRODUODENOSCOPY (EGD) WITH PROPOFOL Left 03/28/2020   Procedure: ESOPHAGOGASTRODUODENOSCOPY (EGD) WITH PROPOFOL;  Surgeon: Bernette Redbird, MD;  Location: [facility] ENDOSCOPY;  Service: Endoscopy;  Laterality: Left;  . INGUINAL HERNIA REPAIR  1980's   Bilateral,  done in [facility]  . IR PERC CHOLECYSTOSTOMY  03/16/2020  . LEFT HEART CATH AND CORS/GRAFTS ANGIOGRAPHY N/A 08/07/2016   Procedure: Left Heart Cath and Cors/Grafts Angiography;  Surgeon: Corky Crafts, MD;  Location: [facility] INVASIVE CV LAB;  Service: Cardiovascular;  Laterality: N/A;  . PR VEIN BYPASS GRAFT,AORTO-FEM-POP  1980   FEM-FEM BPG by [name]  . RADIOLOGY WITH ANESTHESIA N/A 03/15/2020   Procedure: IR WITH ANESTHESIA;  Surgeon: Irish Lack, MD;  Location: [facility] OR;  Service: Radiology;  Laterality: N/A;  . RIGHT HEART CATH N/A 06/02/2020   Procedure: RIGHT HEART CATH;  Surgeon: Laurey Morale, MD;  Location: [facility] INVASIVE CV LAB;  Service: Cardiovascular;  Laterality: N/A;       Family History  Problem Relation Age of Onset  . Other Mother        varicose veins  . Heart disease Mother   . Hyperlipidemia Mother   . Hypertension Mother   . Varicose Veins Mother   . Cancer Father   . Diabetes Father   . Heart disease Father        before age 55  . Hyperlipidemia Father   . Hypertension Father   . Other Father        varicose veins  . Heart attack Father   . Diabetes Daughter   . Hyperlipidemia Daughter   . Hypertension Daughter   . Other Daughter        varicose veins  . Varicose Veins Daughter   . Hypertension Son   . Diabetes Sister   . Heart disease Sister        DVT  . Other Sister        varicose veins  .  Hyperlipidemia Sister   . Hypertension Sister   . Varicose Veins Sister   . Other Brother        varicose veins  . Hyperlipidemia Brother   . Hypertension Brother   . Heart attack Brother     Social History   Tobacco Use  . Smoking status: Former Smoker    Types: Cigarettes    Quit date: 05/14/1978    Years since quitting: 42.3  . Smokeless tobacco: Never Used  Substance Use Topics  . Alcohol use: No  . Drug use: No    Home Medications Prior to Admission medications   Medication Sig Start Date End Date Taking? Authorizing Provider  acetaminophen (TYLENOL) 500 MG tablet Take 500-1,000 mg by mouth every 6 (six) hours as  needed for mild pain or headache.    [provider]  allopurinol (ZYLOPRIM) 100 MG tablet TAKE 2 TABLETS BY MOUTH DAILY. 05/03/14   Runell Gess, MD  ciprofloxacin (CIPRO) 500 MG tablet Take 1 tablet (500 mg total) by mouth 2 (two) times daily. 07/09/20   Asencion Islam, DPM  colchicine 0.6 MG tablet Take 0.6 mg by mouth daily as needed (gout attacks).    [provider]  docusate sodium (COLACE) 100 MG capsule Take 1 capsule (100 mg total) by mouth 2 (two) times daily. 12/16/18   Jadene Pierini, MD  empagliflozin (JARDIANCE) 10 MG TABS tablet Take 1 tablet (10 mg total) by mouth daily. 06/11/20   Remo Lipps, MD  ferrous sulfate 325 (65 FE) MG tablet Take 1 tablet (325 mg total) by mouth 2 (two) times daily with a meal. 08/09/16   Ghimire, Werner Lean, MD  gabapentin (NEURONTIN) 300 MG capsule Take 1 capsule (300 mg total) by mouth 3 (three) times daily. Take 300 mg by mouth three times a day and 600 mg at bedtime 06/10/20   Remo Lipps, MD  glipiZIDE (GLUCOTROL) 5 MG tablet Take 1 tablet (5 mg total) by mouth daily before breakfast. 06/10/20   Remo Lipps, MD  HYDROcodone-acetaminophen [facility]) 10-325 MG tablet Take by mouth. 01/16/19   [provider]  Multiple Vitamin (MULTIVITAMIN WITH MINERALS) TABS tablet Take 1 tablet by  mouth daily.    [provider]  pantoprazole (PROTONIX) 40 MG tablet Take 40 mg by mouth 2 (two) times daily before a meal.     [provider]  potassium chloride (KLOR-CON) 10 MEQ tablet Take 2 tablets (20 mEq total) by mouth daily. 06/21/20   Clegg, Amy D, NP  potassium chloride (KLOR-CON) 10 MEQ tablet Take 10 mEq by mouth daily. 06/07/20   [provider]  povidone-iodine (BETADINE) 10 % external solution Apply 1 application topically as needed for wound care. 07/05/20   Asencion Islam, DPM  pravastatin (PRAVACHOL) 40 MG tablet Take 40 mg by mouth at bedtime.     [provider]  sildenafil (REVATIO) 20 MG tablet Take 20 mg by mouth 3 (three) times daily. 06/16/20   [provider]  sodium chloride flush (NS) 0.9 % SOLN Instill 5 mL daily into percutaneous drain daily x 8 weeks. 04/05/20   Gertha Calkin, MD  torsemide (DEMADEX) 20 MG tablet Take 2 tablets (40 mg total) by mouth 2 (two) times daily. 07/01/20 09/29/20  Clegg, Amy D, NP  warfarin (COUMADIN) 1 MG tablet Take 1 mg by mouth daily. Taking with the 5mg  = 6mg  daily 12/18/19   [provider]  warfarin (COUMADIN) 5 MG tablet Take 5 mg by mouth daily with supper. Taking with the 1mg  = 6mg  daily, unless directed differently by clinic 01/28/20   [provider]  Wound Dressings (MEDIHONEY WOUND/BURN DRESSING) GEL Apply for wound care topically as directed 07/05/20   , DPM    Allergies    Patient has no known allergies.  Review of Systems   Review of Systems  Constitutional: Negative for fever.  HENT: Negative for nosebleeds.   Eyes: Negative for visual disturbance.  Respiratory: Negative for shortness of breath.   Cardiovascular: Negative for chest pain.  Gastrointestinal: Negative for abdominal pain, nausea and vomiting.  Genitourinary: Negative for flank pain.  Musculoskeletal: Positive for back pain. Negative for neck pain.  Skin: Negative for rash.   Neurological: Negative for weakness,  numbness and headaches.  Hematological: Does not bruise/bleed easily.  Psychiatric/Behavioral: Negative for confusion.    Physical Exam Updated Vital Signs BP (!) 174/66 (BP Location: Left Arm)   Pulse (!) 101   Temp (!) 97.5 F (36.4 C) (Oral)   Resp 16   SpO2 96%   Physical Exam Vitals and nursing note reviewed.  Constitutional:      Appearance: Normal appearance. He is well-developed.  HENT:     Head: Atraumatic.     Nose: Nose normal.     Mouth/Throat:     Mouth: Mucous membranes are moist.     Pharynx: Oropharynx is clear.  Eyes:     General: No scleral icterus.    Conjunctiva/sclera: Conjunctivae normal.     Pupils: Pupils are equal, round, and reactive to light.  Neck:     Trachea: No tracheal deviation.  Cardiovascular:     Rate and Rhythm: Normal rate and regular rhythm.     Pulses: Normal pulses.     Heart sounds: Normal heart sounds. No murmur heard. No friction rub. No gallop.   Pulmonary:     Effort: Pulmonary effort is normal. No accessory muscle usage or respiratory distress.     Breath sounds: Normal breath sounds.  Chest:     Chest wall: No tenderness.  Abdominal:     General: Bowel sounds are normal. There is no distension.     Palpations: Abdomen is soft.     Tenderness: There is no abdominal tenderness.     Comments: No abd bruising or contusion.   Genitourinary:    Comments: No cva tenderness. Musculoskeletal:        General: No swelling or tenderness.     Cervical back: Normal range of motion and neck supple. No rigidity or tenderness.     Comments: Lumbar tenderness, otherwise, CTLS spine, non tender, aligned, no step off. Good rom bil extremities without pain or focal bony tenderness. Superficial skin tears/abrasions to right forearm without sign of infection.   Skin:    General: Skin is warm and dry.     Findings: No rash.  Neurological:     Mental Status: He is alert.     Comments: Alert, speech  clear. GCS 15. Motor/sens grossly intact bil.   Psychiatric:        Mood and Affect: Mood normal.     ED Results / Procedures / Treatments   Labs (all labs ordered are listed, but only abnormal results are displayed) Labs Reviewed - No data to display  EKG None  Radiology DG Lumbar Spine Complete  Result Date: 09/11/2020 CLINICAL DATA:  Fall EXAM: LUMBAR SPINE - COMPLETE 4+ VIEW COMPARISON:  None. FINDINGS: Decreased osseous mineralization. Posterior fusion construct extending superiorly from L1. Lumbar vertebral body heights are maintained. No acute fracture. No substantial disc space narrowing. Multilevel facet hypertrophy. Extensive vascular calcification. IMPRESSION: No acute fracture. Electronically Signed   By: Guadlupe Spanish M.D.   On: 09/11/2020 14:30    Procedures Procedures   Medications Ordered in ED Medications  Tdap (BOOSTRIX) injection 0.5 mL (has no administration in time range)    ED Course  I have reviewed the triage vital signs and the nursing notes.  Pertinent labs & imaging results that were available during my care of the patient were reviewed by me and considered in my medical decision making (see chart for details).    MDM Rules/Calculators/A&P  Xrays ordered. Wound cleaned, bacitracin and sterile dressing applied.   Reviewed nursing notes and prior charts for additional history.   Xrays reviewed/interpreted by me - no fx  Tetanus im.   Ultram po.  Recheck pt, comfortable appearing, no distress. Discussed imaging.   Rec pcp f/u.  Return precautions provided.     Final Clinical Impression(s) / ED Diagnoses Final diagnoses:  None    Rx / DC Orders ED Discharge Orders    None       Cathren Laine, MD 09/11/20 1537

## 2020-09-11 NOTE — ED Triage Notes (Addendum)
Pt fell last week on pool deck.  Reports lower back pain since fall.  Pt wears 2 liters O2 at home.  Arrived without any oxygen.

## 2020-09-11 NOTE — ED Notes (Signed)
All appropriate discharge materials reviewed at length with patient. Time for questions provided. Pt has no other questions at this time and verbalizes understanding of all provided materials.  

## 2020-09-11 NOTE — ED Notes (Signed)
RN completed wound care on pt's R arm. Bacitracin, gauze and coban applied.

## 2020-09-11 NOTE — Discharge Instructions (Addendum)
It was our pleasure to provide your ER care today - we hope that you feel better.  Rest. Avoid bending at waist or heavy lifting > 10 lbs.   Take acetaminophen as need for pain.  You may also take ultram as need for pain - no driving when taking.   Fall precautions - use great care and caution to help minimize risk of falling.   Follow up with primary care doctor in 1-2 weeks if symptoms fail to improve/resolve.  Return to ER if worse, new symptoms, fevers, severe or intractable pain, numbness or weakness, or other concern.

## 2020-09-16 ENCOUNTER — Encounter (HOSPITAL_COMMUNITY): Payer: Medicare Other | Admitting: Cardiology

## 2020-09-21 DIAGNOSIS — I361 Nonrheumatic tricuspid (valve) insufficiency: Secondary | ICD-10-CM

## 2020-10-13 ENCOUNTER — Other Ambulatory Visit: Payer: Self-pay

## 2020-10-13 ENCOUNTER — Encounter (HOSPITAL_COMMUNITY): Payer: Self-pay

## 2020-10-13 ENCOUNTER — Emergency Department (HOSPITAL_COMMUNITY): Payer: Medicare Other

## 2020-10-13 ENCOUNTER — Inpatient Hospital Stay (HOSPITAL_COMMUNITY)
Admission: EM | Admit: 2020-10-13 | Discharge: 2020-10-21 | DRG: 291 | Disposition: A | Discharge status: Skilled Nursing Facility | Payer: Medicare Other | Source: Skilled Nursing Facility | Attending: Family Medicine | Admitting: Family Medicine

## 2020-10-13 DIAGNOSIS — L89152 Pressure ulcer of sacral region, stage 2: Secondary | ICD-10-CM | POA: Diagnosis present

## 2020-10-13 DIAGNOSIS — E1151 Type 2 diabetes mellitus with diabetic peripheral angiopathy without gangrene: Secondary | ICD-10-CM | POA: Diagnosis present

## 2020-10-13 DIAGNOSIS — N390 Urinary tract infection, site not specified: Secondary | ICD-10-CM | POA: Diagnosis present

## 2020-10-13 DIAGNOSIS — Z8673 Personal history of transient ischemic attack (TIA), and cerebral infarction without residual deficits: Secondary | ICD-10-CM

## 2020-10-13 DIAGNOSIS — Z951 Presence of aortocoronary bypass graft: Secondary | ICD-10-CM | POA: Diagnosis not present

## 2020-10-13 DIAGNOSIS — I5023 Acute on chronic systolic (congestive) heart failure: Secondary | ICD-10-CM

## 2020-10-13 DIAGNOSIS — I130 Hypertensive heart and chronic kidney disease with heart failure and stage 1 through stage 4 chronic kidney disease, or unspecified chronic kidney disease: Secondary | ICD-10-CM | POA: Diagnosis present

## 2020-10-13 DIAGNOSIS — R001 Bradycardia, unspecified: Secondary | ICD-10-CM | POA: Diagnosis present

## 2020-10-13 DIAGNOSIS — I10 Essential (primary) hypertension: Secondary | ICD-10-CM | POA: Diagnosis present

## 2020-10-13 DIAGNOSIS — I272 Pulmonary hypertension, unspecified: Secondary | ICD-10-CM | POA: Diagnosis present

## 2020-10-13 DIAGNOSIS — I5033 Acute on chronic diastolic (congestive) heart failure: Secondary | ICD-10-CM | POA: Diagnosis present

## 2020-10-13 DIAGNOSIS — B952 Enterococcus as the cause of diseases classified elsewhere: Secondary | ICD-10-CM | POA: Diagnosis present

## 2020-10-13 DIAGNOSIS — I252 Old myocardial infarction: Secondary | ICD-10-CM

## 2020-10-13 DIAGNOSIS — G4733 Obstructive sleep apnea (adult) (pediatric): Secondary | ICD-10-CM | POA: Diagnosis present

## 2020-10-13 DIAGNOSIS — I2510 Atherosclerotic heart disease of native coronary artery without angina pectoris: Secondary | ICD-10-CM | POA: Diagnosis present

## 2020-10-13 DIAGNOSIS — I482 Chronic atrial fibrillation, unspecified: Secondary | ICD-10-CM | POA: Diagnosis present

## 2020-10-13 DIAGNOSIS — Z87891 Personal history of nicotine dependence: Secondary | ICD-10-CM | POA: Diagnosis not present

## 2020-10-13 DIAGNOSIS — I358 Other nonrheumatic aortic valve disorders: Secondary | ICD-10-CM | POA: Diagnosis present

## 2020-10-13 DIAGNOSIS — Z955 Presence of coronary angioplasty implant and graft: Secondary | ICD-10-CM

## 2020-10-13 DIAGNOSIS — F0390 Unspecified dementia without behavioral disturbance: Secondary | ICD-10-CM | POA: Diagnosis present

## 2020-10-13 DIAGNOSIS — E785 Hyperlipidemia, unspecified: Secondary | ICD-10-CM | POA: Diagnosis present

## 2020-10-13 DIAGNOSIS — E1122 Type 2 diabetes mellitus with diabetic chronic kidney disease: Secondary | ICD-10-CM | POA: Diagnosis present

## 2020-10-13 DIAGNOSIS — Z8614 Personal history of Methicillin resistant Staphylococcus aureus infection: Secondary | ICD-10-CM

## 2020-10-13 DIAGNOSIS — R1030 Lower abdominal pain, unspecified: Secondary | ICD-10-CM | POA: Diagnosis not present

## 2020-10-13 DIAGNOSIS — N1832 Chronic kidney disease, stage 3b: Secondary | ICD-10-CM | POA: Diagnosis present

## 2020-10-13 DIAGNOSIS — Z83438 Family history of other disorder of lipoprotein metabolism and other lipidemia: Secondary | ICD-10-CM

## 2020-10-13 DIAGNOSIS — E1159 Type 2 diabetes mellitus with other circulatory complications: Secondary | ICD-10-CM | POA: Diagnosis present

## 2020-10-13 DIAGNOSIS — Z20822 Contact with and (suspected) exposure to covid-19: Secondary | ICD-10-CM | POA: Diagnosis present

## 2020-10-13 DIAGNOSIS — K5900 Constipation, unspecified: Secondary | ICD-10-CM | POA: Diagnosis present

## 2020-10-13 DIAGNOSIS — R109 Unspecified abdominal pain: Secondary | ICD-10-CM

## 2020-10-13 DIAGNOSIS — N183 Chronic kidney disease, stage 3 unspecified: Secondary | ICD-10-CM | POA: Diagnosis present

## 2020-10-13 DIAGNOSIS — Z8249 Family history of ischemic heart disease and other diseases of the circulatory system: Secondary | ICD-10-CM

## 2020-10-13 DIAGNOSIS — Z9049 Acquired absence of other specified parts of digestive tract: Secondary | ICD-10-CM | POA: Diagnosis not present

## 2020-10-13 DIAGNOSIS — Z833 Family history of diabetes mellitus: Secondary | ICD-10-CM

## 2020-10-13 DIAGNOSIS — Z7984 Long term (current) use of oral hypoglycemic drugs: Secondary | ICD-10-CM

## 2020-10-13 DIAGNOSIS — Z79899 Other long term (current) drug therapy: Secondary | ICD-10-CM

## 2020-10-13 LAB — CBC WITH DIFFERENTIAL/PLATELET
Abs Immature Granulocytes: 0.02 10*3/uL (ref 0.00–0.07)
Basophils Absolute: 0.1 10*3/uL (ref 0.0–0.1)
Basophils Relative: 1 %
Eosinophils Absolute: 0.2 10*3/uL (ref 0.0–0.5)
Eosinophils Relative: 2 %
HCT: 29.7 % — ABNORMAL LOW (ref 39.0–52.0)
Hemoglobin: 8.5 g/dL — ABNORMAL LOW (ref 13.0–17.0)
Immature Granulocytes: 0 %
Lymphocytes Relative: 21 %
Lymphs Abs: 1.7 10*3/uL (ref 0.7–4.0)
MCH: 29.2 pg (ref 26.0–34.0)
MCHC: 28.6 g/dL — ABNORMAL LOW (ref 30.0–36.0)
MCV: 102.1 fL — ABNORMAL HIGH (ref 80.0–100.0)
Monocytes Absolute: 0.6 10*3/uL (ref 0.1–1.0)
Monocytes Relative: 8 %
Neutro Abs: 5.6 10*3/uL (ref 1.7–7.7)
Neutrophils Relative %: 68 %
Platelets: 228 10*3/uL (ref 150–400)
RBC: 2.91 MIL/uL — ABNORMAL LOW (ref 4.22–5.81)
RDW: 16.6 % — ABNORMAL HIGH (ref 11.5–15.5)
WBC: 8.1 10*3/uL (ref 4.0–10.5)
nRBC: 0 % (ref 0.0–0.2)

## 2020-10-13 LAB — COMPREHENSIVE METABOLIC PANEL
ALT: 14 U/L (ref 0–44)
AST: 19 U/L (ref 15–41)
Albumin: 2.9 g/dL — ABNORMAL LOW (ref 3.5–5.0)
Alkaline Phosphatase: 93 U/L (ref 38–126)
Anion gap: 9 (ref 5–15)
BUN: 56 mg/dL — ABNORMAL HIGH (ref 8–23)
CO2: 36 mmol/L — ABNORMAL HIGH (ref 22–32)
Calcium: 8.9 mg/dL (ref 8.9–10.3)
Chloride: 91 mmol/L — ABNORMAL LOW (ref 98–111)
Creatinine, Ser: 1.54 mg/dL — ABNORMAL HIGH (ref 0.61–1.24)
GFR, Estimated: 45 mL/min — ABNORMAL LOW (ref >60)
Glucose, Bld: 114 mg/dL — ABNORMAL HIGH (ref 70–99)
Potassium: 4.9 mmol/L (ref 3.5–5.1)
Sodium: 136 mmol/L (ref 135–145)
Total Bilirubin: 0.5 mg/dL (ref 0.3–1.2)
Total Protein: 7.5 g/dL (ref 6.5–8.1)

## 2020-10-13 LAB — GLUCOSE, CAPILLARY: Glucose-Capillary: 86 mg/dL (ref 70–99)

## 2020-10-13 LAB — BRAIN NATRIURETIC PEPTIDE: B Natriuretic Peptide: 245 pg/mL — ABNORMAL HIGH (ref 0.0–100.0)

## 2020-10-13 LAB — TROPONIN I (HIGH SENSITIVITY)
Troponin I (High Sensitivity): 24 ng/L — ABNORMAL HIGH (ref <18)
Troponin I (High Sensitivity): 25 ng/L — ABNORMAL HIGH (ref <18)

## 2020-10-13 MED ORDER — HYDROCODONE-ACETAMINOPHEN 5-325 MG PO TABS
1.0000 | ORAL_TABLET | ORAL | Status: DC | PRN
  Administered 2020-10-14 – 2020-10-21 (×10): 1 via ORAL
  Filled 2020-10-13 (×12): qty 1

## 2020-10-13 MED ORDER — HYDROCODONE-ACETAMINOPHEN 5-325 MG PO TABS
1.0000 | ORAL_TABLET | Freq: Once | ORAL | Status: AC
  Administered 2020-10-13: 1 via ORAL
  Filled 2020-10-13: qty 1

## 2020-10-13 MED ORDER — FUROSEMIDE 10 MG/ML IJ SOLN
60.0000 mg | Freq: Once | INTRAMUSCULAR | Status: AC
  Administered 2020-10-13: 60 mg via INTRAVENOUS
  Filled 2020-10-13: qty 6

## 2020-10-13 MED ORDER — INSULIN ASPART 100 UNIT/ML IJ SOLN
0.0000 [IU] | Freq: Three times a day (TID) | INTRAMUSCULAR | Status: DC
  Administered 2020-10-14: 2 [IU] via SUBCUTANEOUS
  Administered 2020-10-15 – 2020-10-16 (×2): 1 [IU] via SUBCUTANEOUS
  Administered 2020-10-16 – 2020-10-19 (×5): 2 [IU] via SUBCUTANEOUS
  Administered 2020-10-19: 1 [IU] via SUBCUTANEOUS
  Administered 2020-10-20 (×2): 2 [IU] via SUBCUTANEOUS
  Administered 2020-10-21 (×2): 1 [IU] via SUBCUTANEOUS

## 2020-10-13 MED ORDER — POTASSIUM CHLORIDE CRYS ER 10 MEQ PO TBCR
20.0000 meq | EXTENDED_RELEASE_TABLET | Freq: Every day | ORAL | Status: DC
  Administered 2020-10-14 – 2020-10-19 (×6): 20 meq via ORAL
  Filled 2020-10-13 (×7): qty 2

## 2020-10-13 MED ORDER — INSULIN ASPART 100 UNIT/ML IJ SOLN: 0.0000 [IU] | Freq: Three times a day (TID) | INTRAMUSCULAR | Status: DC

## 2020-10-13 MED ORDER — QUETIAPINE FUMARATE 50 MG PO TABS
75.0000 mg | ORAL_TABLET | Freq: Every day | ORAL | Status: DC
  Administered 2020-10-13 – 2020-10-20 (×8): 75 mg via ORAL
  Filled 2020-10-13 (×10): qty 1

## 2020-10-13 MED ORDER — FERROUS SULFATE 325 (65 FE) MG PO TABS
325.0000 mg | ORAL_TABLET | Freq: Two times a day (BID) | ORAL | Status: DC
  Administered 2020-10-14 – 2020-10-21 (×16): 325 mg via ORAL
  Filled 2020-10-13 (×16): qty 1

## 2020-10-13 MED ORDER — LIDOCAINE 5 % EX PTCH
1.0000 | MEDICATED_PATCH | CUTANEOUS | Status: DC
  Administered 2020-10-14 – 2020-10-21 (×9): 1 via TRANSDERMAL
  Filled 2020-10-13 (×9): qty 1

## 2020-10-13 MED ORDER — SODIUM CHLORIDE 0.9% FLUSH: 3.0000 mL | INTRAVENOUS | Status: DC | PRN

## 2020-10-13 MED ORDER — METHOCARBAMOL 500 MG PO TABS
500.0000 mg | ORAL_TABLET | Freq: Two times a day (BID) | ORAL | Status: DC
  Administered 2020-10-13 – 2020-10-21 (×16): 500 mg via ORAL
  Filled 2020-10-13 (×16): qty 1

## 2020-10-13 MED ORDER — GABAPENTIN 300 MG PO CAPS
300.0000 mg | ORAL_CAPSULE | Freq: Three times a day (TID) | ORAL | Status: DC
  Administered 2020-10-13 – 2020-10-21 (×24): 300 mg via ORAL
  Filled 2020-10-13 (×24): qty 1

## 2020-10-13 MED ORDER — AMLODIPINE BESYLATE 10 MG PO TABS
10.0000 mg | ORAL_TABLET | Freq: Every day | ORAL | Status: DC
  Administered 2020-10-14 – 2020-10-15 (×2): 10 mg via ORAL
  Filled 2020-10-13 (×2): qty 1

## 2020-10-13 MED ORDER — SODIUM CHLORIDE 0.9% FLUSH
3.0000 mL | Freq: Two times a day (BID) | INTRAVENOUS | Status: DC
  Administered 2020-10-13 – 2020-10-15 (×4): 3 mL via INTRAVENOUS

## 2020-10-13 MED ORDER — FUROSEMIDE 10 MG/ML IJ SOLN
60.0000 mg | Freq: Two times a day (BID) | INTRAMUSCULAR | Status: AC
  Administered 2020-10-13 – 2020-10-14 (×3): 60 mg via INTRAVENOUS
  Filled 2020-10-13 (×3): qty 6

## 2020-10-13 MED ORDER — AMLODIPINE BESYLATE 5 MG PO TABS: 10.0000 mg | ORAL_TABLET | Freq: Every day | ORAL | Status: DC

## 2020-10-13 MED ORDER — SACUBITRIL-VALSARTAN 24-26 MG PO TABS
1.0000 | ORAL_TABLET | Freq: Two times a day (BID) | ORAL | Status: DC
  Administered 2020-10-13 – 2020-10-21 (×16): 1 via ORAL
  Filled 2020-10-13 (×16): qty 1

## 2020-10-13 MED ORDER — HEPARIN SODIUM (PORCINE) 5000 UNIT/ML IJ SOLN
5000.0000 [IU] | Freq: Three times a day (TID) | INTRAMUSCULAR | Status: DC
  Administered 2020-10-13 – 2020-10-21 (×23): 5000 [IU] via SUBCUTANEOUS
  Filled 2020-10-13 (×24): qty 1

## 2020-10-13 MED ORDER — TRAZODONE HCL 50 MG PO TABS
25.0000 mg | ORAL_TABLET | Freq: Every day | ORAL | Status: DC
  Administered 2020-10-13 – 2020-10-20 (×8): 25 mg via ORAL
  Filled 2020-10-13 (×8): qty 1

## 2020-10-13 MED ORDER — EMPAGLIFLOZIN 10 MG PO TABS
10.0000 mg | ORAL_TABLET | Freq: Every day | ORAL | Status: DC
  Administered 2020-10-13 – 2020-10-21 (×9): 10 mg via ORAL
  Filled 2020-10-13 (×9): qty 1

## 2020-10-13 MED ORDER — QUETIAPINE FUMARATE 50 MG PO TABS
76.0000 mg | ORAL_TABLET | Freq: Every day | ORAL | Status: DC
  Filled 2020-10-13: qty 1

## 2020-10-13 MED ORDER — SILDENAFIL CITRATE 20 MG PO TABS
20.0000 mg | ORAL_TABLET | Freq: Three times a day (TID) | ORAL | Status: DC
  Administered 2020-10-13 – 2020-10-21 (×24): 20 mg via ORAL
  Filled 2020-10-13 (×24): qty 1

## 2020-10-13 MED ORDER — LIDOCAINE 5 % EX PTCH: 1.0000 | MEDICATED_PATCH | CUTANEOUS | Status: DC

## 2020-10-13 MED ORDER — SODIUM CHLORIDE 0.9 % IV SOLN: 250.0000 mL | INTRAVENOUS | Status: DC | PRN

## 2020-10-13 MED ORDER — ACETAMINOPHEN 325 MG PO TABS
650.0000 mg | ORAL_TABLET | ORAL | Status: DC | PRN
  Administered 2020-10-16: 650 mg via ORAL
  Filled 2020-10-13 (×2): qty 2

## 2020-10-13 MED ORDER — ONDANSETRON HCL 4 MG/2ML IJ SOLN
4.0000 mg | Freq: Four times a day (QID) | INTRAMUSCULAR | Status: DC | PRN
  Administered 2020-10-18: 4 mg via INTRAVENOUS
  Filled 2020-10-13: qty 2

## 2020-10-13 MED ORDER — INSULIN ASPART 100 UNIT/ML IJ SOLN: 0.0000 [IU] | Freq: Every day | INTRAMUSCULAR | Status: DC

## 2020-10-13 MED ORDER — POLYETHYLENE GLYCOL 3350 17 G PO PACK
17.0000 g | PACK | Freq: Two times a day (BID) | ORAL | Status: DC
  Administered 2020-10-13 – 2020-10-21 (×13): 17 g via ORAL
  Filled 2020-10-13 (×13): qty 1

## 2020-10-13 NOTE — H&P (Signed)
History and Physical    CAMDAN BURDI KGU:[id number] DOB: [birth year] DOA: 10/13/2020  PCP: [place], [name], MD   Patient coming from: [facility]   Chief Complaint: SOB  HPI: Isaiah [name] is a 80 y.o. male with medical history significant for HFpEF, A-fib-not on anticoagulation due to intracranial hemorrhage, HTN, DMT2, HLD, CKD 3b, CAD, fracture of L1, PVD, OSA who has been at [facility] SNF for past few weeks for rehab after recent admission. He reports he was feeling well yesterday but when he woke up this am he had increased SOB. He uses trilogy machine is seen at night and uses supplemental oxygen as needed during the day when he is short of breath.  He states that he felt smothered when he was laying flat when he first woke up and over the course of the day his breathing got progressively worse and he was sent to the emergency room for evaluation.  He denies having any chest pain or pressure.  When EMS initially evaluated patient has oxygen saturations were in the low 80s and his oxygen was turned up to 6 L by nasal cannula.  Reports he has been taking his medications as prescribed.  He is doing physical therapy at the facility with a goal of returning home when rehab is complete.  He has not had any change in diet and has not been adding salt to his food he states.   ED Course: Mr. Pettet is hemodynamically stable with heart rate in the mid to upper 45-55 range normal for him per his daughter.  Daughter is at bedside.  Chest x-ray revealed cardiomegaly with pulmonary edema consistent with CHF.  BNP elevated at 245, troponin 24.  Are normal.  Creatinine 1.54 with a BUN of 56.  Glucose 114.  WBC 8100, hemoglobin 8.5, hematocrit 21.7, platelets 228,000.  Hospitalist services been asked to admit for further management  Review of Systems:  General: Denies fever, chills, weight loss, night sweats.  Denies dizziness.  Denies change in appetite HENT: Denies head trauma, headache, denies change in hearing,  tinnitus.  Denies nasal congestion or bleeding.  Denies sore throat, sores in mouth.  Denies difficulty swallowing Eyes: Denies blurry vision, pain in eye, drainage.  Denies discoloration of eyes. Neck: Denies pain.  Denies swelling.  Denies pain with movement. Cardiovascular: Denies chest pain, palpitations.  Has chronic mild edema.  Reports orthopnea Respiratory: Reports shortness of breath. Denies cough.  Denies wheezing.  Denies sputum production Gastrointestinal: Denies abdominal pain, swelling.  Denies nausea, vomiting, diarrhea.  Denies melena.  Denies hematemesis. Musculoskeletal: Denies limitation of movement.  Denies deformity or swelling.  Reports low back pain Genitourinary: Denies pelvic pain.  Denies urinary frequency or hesitancy.  Denies dysuria.  Skin: Denies rash.  Denies petechiae, purpura, ecchymosis. Neurological: Denies headache.  Denies syncope. Denies seizure activity. Denies paresthesia. Denies slurred speech, drooping face.  Denies visual change. Psychiatric: Denies depression, anxiety.  Denies hallucinations.  Past Medical History:  Diagnosis Date  . Atrial fibrillation (HCC)    on Coumadin  . CAD (coronary artery disease)    s/p remote CABG, stent in 2019  . CHF (congestive heart failure) (HCC)   . Dementia (HCC)   . Diabetes mellitus   . DVT (deep venous thrombosis) (HCC)   . Fall at home 10/2015  . Hyperlipidemia   . Hypertension   . Left-sided carotid artery disease (HCC)   . MRSA infection   . Myocardial infarction (HCC)   . Obesity   .  Peripheral vascular disease (HCC)   . Sleep apnea   . Stroke (HCC)   . Stroke (HCC)   . Venous insufficiency     Past Surgical History:  Procedure Laterality Date  . CORONARY ARTERY BYPASS GRAFT  1997   vein harvest from right leg  . CORONARY STENT INTERVENTION N/A 08/07/2016   Procedure: Coronary Stent Intervention;  Surgeon: Corky Crafts, MD;  Location: [facility] INVASIVE CV LAB;  Service: Cardiovascular;   Laterality: N/A;  RCA  . ESOPHAGOGASTRODUODENOSCOPY N/A 08/12/2016   Procedure: ESOPHAGOGASTRODUODENOSCOPY (EGD);  Surgeon: Carman Ching, MD;  Location: [facility] ENDOSCOPY;  Service: Endoscopy;  Laterality: N/A;  . ESOPHAGOGASTRODUODENOSCOPY (EGD) WITH PROPOFOL Left 03/28/2020   Procedure: ESOPHAGOGASTRODUODENOSCOPY (EGD) WITH PROPOFOL;  Surgeon: Bernette Redbird, MD;  Location: [facility] ENDOSCOPY;  Service: Endoscopy;  Laterality: Left;  . INGUINAL HERNIA REPAIR  1980's   Bilateral,  done in [facility]  . IR PERC CHOLECYSTOSTOMY  03/16/2020  . LEFT HEART CATH AND CORS/GRAFTS ANGIOGRAPHY N/A 08/07/2016   Procedure: Left Heart Cath and Cors/Grafts Angiography;  Surgeon: Corky Crafts, MD;  Location: [facility] INVASIVE CV LAB;  Service: Cardiovascular;  Laterality: N/A;  . PR VEIN BYPASS GRAFT,AORTO-FEM-POP  1980   FEM-FEM BPG by [name]  . RADIOLOGY WITH ANESTHESIA N/A 03/15/2020   Procedure: IR WITH ANESTHESIA;  Surgeon: Irish Lack, MD;  Location[facility];  Service: Radiology;  Laterality: N/A;  . RIGHT HEART CATH N/A 06/02/2020   Procedure: RIGHT HEART CATH;  Surgeon: Laurey Morale, MD;  Location: [facility] INVASIVE CV LAB;  Service: Cardiovascular;  Laterality: N/A;    Social History  reports that he quit smoking about 42 years ago. His smoking use included cigarettes. He has never used smokeless tobacco. He reports that he does not drink alcohol and does not use drugs.  No Known Allergies  Family History  Problem Relation Age of Onset  . Other Mother        varicose veins  . Heart disease Mother   . Hyperlipidemia Mother   . Hypertension Mother   . Varicose Veins Mother   . Cancer Father   . Diabetes Father   . Heart disease Father        before age 81  . Hyperlipidemia Father   . Hypertension Father   . Other Father        varicose veins  . Heart attack Father   . Diabetes Daughter   . Hyperlipidemia Daughter   . Hypertension Daughter   . Other Daughter        varicose veins  .  Varicose Veins Daughter   . Hypertension Son   . Diabetes Sister   . Heart disease Sister        DVT  . Other Sister        varicose veins  . Hyperlipidemia Sister   . Hypertension Sister   . Varicose Veins Sister   . Other Brother        varicose veins  . Hyperlipidemia Brother   . Hypertension Brother   . Heart attack Brother      Prior to Admission medications   Medication Sig Start Date End Date Taking? Authorizing Provider  acetaminophen (TYLENOL) 325 MG tablet Take 650 mg by mouth every 4 (four) hours as needed for mild pain.   Yes [provider]  alum & mag hydroxide-simeth (MAALOX PLUS) 400-400-40 MG/5ML suspension Take 30 mLs by mouth every 6 (six) hours as needed for indigestion.   Yes  [provider]  amLODipine (NORVASC) 10 MG tablet Take 1 tablet by mouth daily. 09/28/20  Yes [provider]  ferrous sulfate 325 (65 FE) MG tablet Take 1 tablet (325 mg total) by mouth 2 (two) times daily with a meal. 08/09/16  Yes Ghimire, Werner Lean, MD  gabapentin (NEURONTIN) 300 MG capsule Take 1 capsule (300 mg total) by mouth 3 (three) times daily. Take 300 mg by mouth three times a day and 600 mg at bedtime 06/10/20  Yes Remo Lipps, MD  glipiZIDE (GLUCOTROL XL) 2.5 MG 24 hr tablet Take 2.5 mg by mouth daily with breakfast.   Yes [provider]  HYDROcodone-acetaminophen (NORCO/VICODIN) 5-325 MG tablet Take 1 tablet by mouth every 4 (four) hours as needed for moderate pain.   Yes [provider]  Infant Care Products [facility]) OINT Apply 1 application topically 3 (three) times daily.   Yes [provider]  lidocaine (LIDODERM) 5 % Place 1 patch onto the skin as directed. Apply to lower back topically for pain and 2 patches to lower back, remove old patch   Yes [provider]  lidocaine (LMX) 4 % cream Apply 1 application topically as directed. Apply to lower back topically for pain and 2 patches to lower back, remove  old patch   Yes [provider]  methocarbamol (ROBAXIN) 500 MG tablet Take 500 mg by mouth in the morning and at bedtime.   Yes [provider]  Multiple Vitamin (MULTIVITAMIN WITH MINERALS) TABS tablet Take 1 tablet by mouth daily.   Yes [provider]  Nutritional Supplements (NUTRITIONAL DRINK PO) Take 4 fluid ounces by mouth in the morning and at bedtime.   Yes [provider]  ondansetron (ZOFRAN) 8 MG tablet Take 8 mg by mouth every 12 (twelve) hours as needed for nausea or vomiting.   Yes [provider]  Pollen Extracts (PROSTAT PO) Take 30 mLs by mouth daily.   Yes [provider]  polyethylene glycol (MIRALAX / GLYCOLAX) 17 g packet Take 17 g by mouth 2 (two) times daily.   Yes [provider]  potassium chloride (KLOR-CON) 10 MEQ tablet Take 2 tablets (20 mEq total) by mouth daily. 06/21/20  Yes Clegg, Amy D, NP  QUEtiapine Fumarate (SEROQUEL PO) Take 76 mg by mouth at bedtime.   Yes [provider]  sennosides-docusate sodium (SENOKOT-S) 8.6-50 MG tablet Take 1 tablet by mouth 2 (two) times daily.   Yes [provider]  sildenafil (REVATIO) 20 MG tablet Take 20 mg by mouth 3 (three) times daily. 06/16/20  Yes [provider]  torsemide (DEMADEX) 20 MG tablet Take 40 mg by mouth in the morning, at noon, and at bedtime.   Yes [provider]  traMADol (ULTRAM) 50 MG tablet Take 1 tablet (50 mg total) by mouth every 8 (eight) hours as needed. Patient taking differently: Take 50 mg by mouth every 8 (eight) hours as needed for moderate pain. 09/11/20  Yes Cathren Laine, MD  traZODone (DESYREL) 50 MG tablet Take 25 mg by mouth at bedtime.   Yes [provider]  allopurinol (ZYLOPRIM) 100 MG tablet TAKE 2 TABLETS BY MOUTH DAILY. Patient not taking: No sig reported 05/03/14   Runell Gess, MD  ciprofloxacin (CIPRO) 500 MG tablet Take 1 tablet (500 mg total) by mouth 2 (two) times  daily. Patient not taking: No sig reported 07/09/20   Asencion Islam, DPM  docusate sodium (COLACE) 100 MG capsule Take 1  capsule (100 mg total) by mouth 2 (two) times daily. Patient not taking: No sig reported 12/16/18   Jadene Pierinistergard, Thomas A, MD  empagliflozin (JARDIANCE) 10 MG TABS tablet Take 1 tablet (10 mg total) by mouth daily. Patient not taking: No sig reported 06/11/20   Remo Lippshen, Joshua Y, MD  glipiZIDE (GLUCOTROL) 5 MG tablet Take 1 tablet (5 mg total) by mouth daily before breakfast. Patient not taking: No sig reported 06/10/20   Remo Lippshen, Joshua Y, MD  povidone-iodine (BETADINE) 10 % external solution Apply 1 application topically as needed for wound care. Patient not taking: No sig reported 07/05/20   Asencion IslamStover, Titorya, DPM  sodium chloride flush (NS) 0.9 % SOLN Instill 5 mL daily into percutaneous drain daily x 8 weeks. Patient not taking: No sig reported 04/05/20   Gertha CalkinPatel, Ekta V, MD  torsemide (DEMADEX) 20 MG tablet Take 2 tablets (40 mg total) by mouth 2 (two) times daily. 07/01/20 09/29/20  Tonye Becketlegg, Amy D, NP  Wound Dressings (MEDIHONEY WOUND/BURN DRESSING) GEL Apply for wound care topically as directed 07/05/20   Asencion IslamStover, Titorya, DPM    Physical Exam: Vitals:   10/13/20 1830 10/13/20 1845 10/13/20 1900 10/13/20 1915  BP: (!) 120/47 (!) 127/53 (!) 133/46 (!) 127/45  Pulse: (!) 40 (!) 49 (!) 48 (!) 49  Resp: 15 19 20 15   Temp:      TempSrc:      SpO2: 97%  95% 94%    Constitutional: NAD, calm, comfortable Vitals:   10/13/20 1830 10/13/20 1845 10/13/20 1900 10/13/20 1915  BP: (!) 120/47 (!) 127/53 (!) 133/46 (!) 127/45  Pulse: (!) 40 (!) 49 (!) 48 (!) 49  Resp: 15 19 20 15   Temp:      TempSrc:      SpO2: 97%  95% 94%   General: WDWN, Alert and oriented x3.  Eyes: EOMI, PERRL, conjunctivae normal.  Sclera nonicteric HENT:  [place]/AT, external ears normal.  Nares patent without epistasis.  Mucous membranes are moist. Narrow OP opening in posterior mouth. No lesions in mouth.  Neck: Soft,  normal range of motion, supple, no masses, no thyromegaly.  Trachea midline Respiratory: Diminished breath sounds with bibasilar rales and crackles. no wheezing,  Normal respiratory effort. No accessory muscle use.  Cardiovascular: Irregularly irregular rhythm with bradycardia, no murmurs / rubs / gallops.  Trace pedal edema. 1+ pedal pulses. Abdomen: Soft, no tenderness, nondistended, no rebound or guarding.  No masses palpated. Bowel sounds normoactive Musculoskeletal: FROM. no cyanosis. No joint deformity upper and lower extremities. Normal muscle tone.  Skin: Warm, dry, intact no rashes, lesions, ulcers. No induration Neurologic: CN 2-12 grossly intact.  Normal speech.  Sensation intact, Strength 5/5 in all extremities.   Psychiatric: Normal judgment and insight.  Normal mood.    Labs on Admission: I have personally reviewed following labs and imaging studies  CBC: Recent Labs  Lab 10/13/20 1255  WBC 8.1  NEUTROABS 5.6  HGB 8.5*  HCT 29.7*  MCV 102.1*  PLT 228    Basic Metabolic Panel: Recent Labs  Lab 10/13/20 1515  NA 136  K 4.9  CL 91*  CO2 36*  GLUCOSE 114*  BUN 56*  CREATININE 1.54*  CALCIUM 8.9    GFR: CrCl cannot be calculated (Unknown ideal weight.).  Liver Function Tests: Recent Labs  Lab 10/13/20 1515  AST 19  ALT 14  ALKPHOS 93  BILITOT 0.5  PROT 7.5  ALBUMIN 2.9*    Urine analysis:    Component Value  Date/Time   COLORURINE YELLOW 03/14/2020 0840   APPEARANCEUR HAZY (A) 03/14/2020 0840   LABSPEC >1.030 (H) 03/14/2020 0840   PHURINE 5.0 03/14/2020 0840   GLUCOSEU NEGATIVE 03/14/2020 0840   HGBUR TRACE (A) 03/14/2020 0840   BILIRUBINUR NEGATIVE 03/14/2020 0840   KETONESUR NEGATIVE 03/14/2020 0840   PROTEINUR 100 (A) 03/14/2020 0840   NITRITE NEGATIVE 03/14/2020 0840   LEUKOCYTESUR NEGATIVE 03/14/2020 0840    Radiological Exams on Admission: DG Chest 2 View  Result Date: 10/13/2020 CLINICAL DATA:  Shortness of breath.  CHF. EXAM:  CHEST - 2 VIEW COMPARISON:  06/02/2020. FINDINGS: Enlarged cardiac silhouette. Calcific atherosclerosis of the aorta small to moderate layering bilateral pleural effusions. Diffuse interstitial prominence with patchy airspace opacities. Postsurgical changes of CABG with median sternotomy. Thoracic spinal fusion hardware. IMPRESSION: Findings suggestive of congestive heart failure with cardiomegaly, layering small to moderate bilateral pleural effusions, and suspected interstitial and alveolar pulmonary edema. Superimposed infection at the bases is not excluded. Electronically Signed   By: Feliberto Harts MD   On: 10/13/2020 13:27    EKG: Independently reviewed.  EKG shows atrial fibrillation with bradycardia. nonspecific ST changes.  No acute ST elevation or depression.  QTc 430  Assessment/Plan Principal Problem:   Acute on chronic heart failure with preserved ejection fraction (HFpEF)  Mr. Illescas is admitted to cardiac telemetry floor. Diurese with Lasix twice a day for the next 2 days.  Monitor I&O's and daily weights.  Supplemental oxygen as needed to keep O2 sat between 92 to 96% Patient restarted on Entresto which is indicated in CHF Monitor blood pressure. Check echocardiogram in the morning.  Last echocardiogram was in January of this year which showed EF of 45 to 50% with LV dysfunction and anterior septal hypokinesis.  Patient also has pulmonary hypertension  Active Problems:   Chronic atrial fibrillation  With chronic atrial fibrillation.  He was taken off of Coumadin a few months ago after an intracranial bleed according to the daughter.  He is not on anticoagulation at this time due to previous bleeding.    Essential hypertension Continue Norvasc.  Monitor blood pressure    Type 2 diabetes mellitus with vascular disease  Check hemoglobin A1c.  Glucotrol will be held during hospitalization due to risk of hypoglycemia.  Monitor blood sugars with meals and bedtime. SSI as needed for  glycemic control. Patient be on Jardiance.  [place] Pepper will help with diabetes control as well as has been shown to improve outcomes with CHF and clinical trials.    CKD (chronic kidney disease), stage IIIB Chronic.  Stable Monitor electrolytes and renal function with labs in morning     Pulmonary hypertension Continue current therapy with Silidenafil.    DVT prophylaxis: Heparin for DVT prophylaxis.  Code Status:   Full code  Family Communication:  Diagnosis and plan discussed with patient and his daughter who is at bedside.  Questions answered..  They agree with plan.  Further recommendations to follow as clinically indicated Disposition Plan:   Patient is from:  SNF  Anticipated DC to:  SNF  Anticipated DC date:  Anticipate 2 midnight or more stay in the hospital  Anticipated DC barriers: No barriers to discharge identified at this time   Admission status:  Inpatient   Claudean Severance Pam Vanalstine MD [facility]  How to contact the [facility] Attending or Consulting provider 7A - 7P or covering provider during after hours 7P -7A, for this patient?   1. Check the care team in [facility]  and look for a) attending/consulting [facility] provider listed and b) the [facility] team listed 2. Log into [email or web address] and use [place]'s universal password to access. If you do not have the password, please contact the hospital operator. 3. Locate the [facility] provider you are looking for under [facility] and page to a number that you can be directly reached. 4. If you still have difficulty reaching the provider, please page the [facility] (Director on Call) for the Hospitalists listed on amion for assistance.  10/13/2020, 8:00 PM

## 2020-10-13 NOTE — ED Triage Notes (Signed)
Pt from [facility] BIB EMS for c/o [facility] throughout today. Pt wears 2L [place] at baseline and staff has had to increase it to 6L due to SpO2 being in the 80's. Pt oriented to self at baseline.

## 2020-10-13 NOTE — ED Notes (Signed)
Back from xray

## 2020-10-13 NOTE — ED Provider Notes (Addendum)
[facility] EMERGENCY DEPARTMENT Provider Note   CSN: [id number] Arrival date & time: 10/13/20  1235     History Chief Complaint  Patient presents with   Shortness of Breath    Isaiah [name] is a 80 y.o. male.  Patient is a [facility] resident and reports that this morning he woke up and he was having increased shortness of breath.  He is usually on 2 L nasal cannula oxygen at baseline but was having trouble breathing this morning and his oxygen saturation was in the low 80s so he had to increase it to 6 L nasal cannula.  Reports good compliance with his medications because they are given by the healthcare providers at his living facility.   Past Medical History:  Diagnosis Date   Atrial fibrillation (HCC)    on Coumadin   CAD (coronary artery disease)    s/p remote CABG, stent in 2019   CHF (congestive heart failure) (HCC)    Dementia (HCC)    Diabetes mellitus    DVT (deep venous thrombosis) (HCC)    Fall at home 10/2015   Hyperlipidemia    Hypertension    Left-sided carotid artery disease (HCC)    MRSA infection    Myocardial infarction [facility])    Obesity    Peripheral vascular disease (HCC)    Sleep apnea    Stroke (HCC)    Stroke [facility])    Venous insufficiency     Patient Active Problem List   Diagnosis Date Noted   Impaired functional mobility, balance, gait, and endurance 07/05/2020   Acute on chronic respiratory failure with hypoxia and hypercapnia (HCC)    Acute exacerbation of congestive heart failure (HCC) 05/20/2020   Biventricular failure (HCC)    Anemia due to GI blood loss    Acute systolic heart failure (HCC)    Shock (HCC)    Cardiac arrest (HCC)    Sepsis due to undetermined organism (HCC) 03/14/2020   Acute cholecystitis 03/14/2020   Class 1 obesity due to excess calories with body mass index (BMI) of 33.0 to 33.9 in adult 03/14/2020   Compression fracture of thoracic vertebra, initial encounter (HCC) 12/31/2018   Pressure injury  of skin 12/15/2018   Thoracic spine fracture (HCC) 12/12/2018   Chronic gout 10/12/2017   Chronic diastolic congestive heart failure (HCC) 10/11/2017   Hyponatremia 08/22/2016   AKI (acute kidney injury) (HCC) 08/21/2016   Hypokalemia 08/11/2016   Occult GI bleeding 08/11/2016   General weakness 08/11/2016   GI bleed 08/11/2016   Weakness    Non-ST elevation (NSTEMI) myocardial infarction (HCC) 08/08/2016   Unstable angina (HCC) 08/06/2016   Chest pain 08/05/2016   CKD (chronic kidney disease), stage III (HCC) 08/05/2016   Normochromic normocytic anemia 08/05/2016   Pain in joint, lower leg 10/01/2014   Coronary artery disease 04/27/2014   Chronic atrial fibrillation (HCC) 04/27/2014   Essential hypertension 04/27/2014   Hyperlipidemia 04/27/2014   Type 2 diabetes mellitus with vascular disease (HCC) 04/27/2014   Obstructive sleep apnea 04/27/2014   Stroke (HCC) 04/27/2014   On continuous oral anticoagulation 04/27/2014   Occlusion and stenosis of carotid artery without mention of cerebral infarction 02/04/2013   Aftercare following surgery of the circulatory system, NEC 01/09/2012   Chronic total occlusion of artery of the extremities (HCC) 01/09/2012    Past Surgical History:  Procedure Laterality Date   CORONARY ARTERY BYPASS GRAFT  1997   vein harvest from right leg   CORONARY STENT  INTERVENTION N/A 08/07/2016   Procedure: Coronary Stent Intervention;  Surgeon: Corky Crafts, MD;  Location: [facility] INVASIVE CV LAB;  Service: Cardiovascular;  Laterality: N/A;  RCA   ESOPHAGOGASTRODUODENOSCOPY N/A 08/12/2016   Procedure: ESOPHAGOGASTRODUODENOSCOPY (EGD);  Surgeon: Carman Ching, MD;  Location: [facility] ENDOSCOPY;  Service: Endoscopy;  Laterality: N/A;   ESOPHAGOGASTRODUODENOSCOPY (EGD) WITH PROPOFOL Left 03/28/2020   Procedure: ESOPHAGOGASTRODUODENOSCOPY (EGD) WITH PROPOFOL;  Surgeon: Bernette Redbird, MD;  Location: [facility] ENDOSCOPY;  Service: Endoscopy;  Laterality: Left;   INGUINAL  HERNIA REPAIR  1980's   Bilateral,  done in [place]   IR PERC CHOLECYSTOSTOMY  03/16/2020   LEFT HEART CATH AND CORS/GRAFTS ANGIOGRAPHY N/A 08/07/2016   Procedure: Left Heart Cath and Cors/Grafts Angiography;  Surgeon: Corky Crafts, MD;  Location: [facility] INVASIVE CV LAB;  Service: Cardiovascular;  Laterality: N/A;   PR VEIN BYPASS GRAFT,AORTO-FEM-POP  1980   FEM-FEM BPG by [name]   RADIOLOGY WITH ANESTHESIA N/A 03/15/2020   Procedure: IR WITH ANESTHESIA;  Surgeon: Irish Lack, MD;  Location[facility] OR;  Service: Radiology;  Laterality: N/A;   RIGHT HEART CATH N/A 06/02/2020   Procedure: RIGHT HEART CATH;  Surgeon: Laurey Morale, MD;  Location: [facility] INVASIVE CV LAB;  Service: Cardiovascular;  Laterality: N/A;       Family History  Problem Relation Age of Onset   Other Mother        varicose veins   Heart disease Mother    Hyperlipidemia Mother    Hypertension Mother    Varicose Veins Mother    Cancer Father    Diabetes Father    Heart disease Father        before age 36   Hyperlipidemia Father    Hypertension Father    Other Father        varicose veins   Heart attack Father    Diabetes Daughter    Hyperlipidemia Daughter    Hypertension Daughter    Other Daughter        varicose veins   Varicose Veins Daughter    Hypertension Son    Diabetes Sister    Heart disease Sister        DVT   Other Sister        varicose veins   Hyperlipidemia Sister    Hypertension Sister    Varicose Veins Sister    Other Brother        varicose veins   Hyperlipidemia Brother    Hypertension Brother    Heart attack Brother     Social History   Tobacco Use   Smoking status: Former Smoker    Types: Cigarettes    Quit date: 05/14/1978    Years since quitting: 42.4   Smokeless tobacco: Never Used  Substance Use Topics   Alcohol use: No   Drug use: No    Home Medications Prior to Admission medications   Medication Sig Start Date End Date Taking? Authorizing Provider   acetaminophen (TYLENOL) 500 MG tablet Take 500-1,000 mg by mouth every 6 (six) hours as needed for mild pain or headache.    [provider]  allopurinol (ZYLOPRIM) 100 MG tablet TAKE 2 TABLETS BY MOUTH DAILY. 05/03/14   Runell Gess, MD  ciprofloxacin (CIPRO) 500 MG tablet Take 1 tablet (500 mg total) by mouth 2 (two) times daily. 07/09/20   Asencion Islam, DPM  colchicine 0.6 MG tablet Take 0.6 mg by mouth daily as needed (gout attacks).    [provider]  docusate sodium (COLACE) 100 MG capsule Take 1 capsule (100 mg total) by mouth 2 (two) times daily. 12/16/18   Jadene Pierini, MD  empagliflozin (JARDIANCE) 10 MG TABS tablet Take 1 tablet (10 mg total) by mouth daily. 06/11/20   Remo Lipps, MD  ferrous sulfate 325 (65 FE) MG tablet Take 1 tablet (325 mg total) by mouth 2 (two) times daily with a meal. 08/09/16   Ghimire, Werner Lean, MD  gabapentin (NEURONTIN) 300 MG capsule Take 1 capsule (300 mg total) by mouth 3 (three) times daily. Take 300 mg by mouth three times a day and 600 mg at bedtime 06/10/20   Remo Lipps, MD  glipiZIDE (GLUCOTROL) 5 MG tablet Take 1 tablet (5 mg total) by mouth daily before breakfast. 06/10/20   Remo Lipps, MD  HYDROcodone-acetaminophen [facility]) 10-325 MG tablet Take by mouth. 01/16/19   [provider]  Multiple Vitamin (MULTIVITAMIN WITH MINERALS) TABS tablet Take 1 tablet by mouth daily.    [provider]  pantoprazole (PROTONIX) 40 MG tablet Take 40 mg by mouth 2 (two) times daily before a meal.     [provider]  potassium chloride (KLOR-CON) 10 MEQ tablet Take 2 tablets (20 mEq total) by mouth daily. 06/21/20   Clegg, Amy D, NP  potassium chloride (KLOR-CON) 10 MEQ tablet Take 10 mEq by mouth daily. 06/07/20   [provider]  povidone-iodine (BETADINE) 10 % external solution Apply 1 application topically as needed for wound care. 07/05/20   Asencion Islam, DPM  pravastatin (PRAVACHOL) 40 MG  tablet Take 40 mg by mouth at bedtime.     [provider]  sildenafil (REVATIO) 20 MG tablet Take 20 mg by mouth 3 (three) times daily. 06/16/20   [provider]  sodium chloride flush (NS) 0.9 % SOLN Instill 5 mL daily into percutaneous drain daily x 8 weeks. 04/05/20   Gertha Calkin, MD  torsemide (DEMADEX) 20 MG tablet Take 2 tablets (40 mg total) by mouth 2 (two) times daily. 07/01/20 09/29/20  Clegg, Amy D, NP  traMADol (ULTRAM) 50 MG tablet Take 1 tablet (50 mg total) by mouth every 8 (eight) hours as needed. 09/11/20   Cathren Laine, MD  Wound Dressings (MEDIHONEY WOUND/BURN DRESSING) GEL Apply for wound care topically as directed 07/05/20   Asencion Islam, DPM    Allergies    Patient has no known allergies.  Review of Systems   Review of Systems  Constitutional:  Positive for activity change. Negative for chills and fever.  HENT:  Negative for congestion.   Respiratory:  Positive for shortness of breath.   Cardiovascular:  Negative for chest pain, palpitations and leg swelling.  Gastrointestinal:  Negative for abdominal pain, diarrhea and nausea.  Endocrine: Negative for polydipsia.  Genitourinary:  Negative for dysuria.  Neurological:  Negative for dizziness, weakness and headaches.   Physical Exam Updated Vital Signs BP (!) 129/53   Pulse (!) 46   Temp 98 F (36.7 C) (Oral)   Resp 17   SpO2 100%   Physical Exam Vitals reviewed.  Constitutional:      Appearance: He is well-developed.  HENT:     Head: Normocephalic and atraumatic.  Eyes:     Extraocular Movements: Extraocular movements intact.     Pupils: Pupils are equal, round, and reactive to light.  Cardiovascular:     Rate and Rhythm: Normal rate. Rhythm irregular.  Pulmonary:     Effort: Pulmonary  effort is normal.     Breath sounds: Examination of the right-lower field reveals rhonchi and rales. Examination of the left-lower field reveals rhonchi and rales. Decreased breath sounds, rhonchi and  rales present.  Chest:     Chest wall: No mass or tenderness.  Abdominal:     General: Bowel sounds are normal.     Palpations: Abdomen is soft.  Musculoskeletal:     Cervical back: Normal range of motion.     Right lower leg: Edema present.     Left lower leg: Edema present.  Skin:    General: Skin is warm.     Capillary Refill: Capillary refill takes less than 2 seconds.  Neurological:     General: No focal deficit present.     Mental Status: He is alert.    ED Results / Procedures / Treatments   Labs (all labs ordered are listed, but only abnormal results are displayed) Labs Reviewed  CBC WITH DIFFERENTIAL/PLATELET - Abnormal; Notable for the following components:      Result Value   RBC 2.91 (*)    Hemoglobin 8.5 (*)    HCT 29.7 (*)    MCV 102.1 (*)    MCHC 28.6 (*)    RDW 16.6 (*)    All other components within normal limits  COMPREHENSIVE METABOLIC PANEL  BRAIN NATRIURETIC PEPTIDE  TROPONIN I (HIGH SENSITIVITY)    EKG EKG Interpretation  Date/Time:  Thursday October 13 2020 12:38:34 EDT Ventricular Rate:  53 PR Interval:    QRS Duration: 106 QT Interval:  457 QTC Calculation: 430 R Axis:   102 Text Interpretation: Atrial fibrillation Right axis deviation Borderline T abnormalities, lateral leads Baseline wander in lead(s) III Confirmed by Marianna Fuss ([id number]) on 10/13/2020 1:07:00 PM   Radiology DG Chest 2 View  Result Date: 10/13/2020 CLINICAL DATA:  Shortness of breath.  CHF. EXAM: CHEST - 2 VIEW COMPARISON:  06/02/2020. FINDINGS: Enlarged cardiac silhouette. Calcific atherosclerosis of the aorta small to moderate layering bilateral pleural effusions. Diffuse interstitial prominence with patchy airspace opacities. Postsurgical changes of CABG with median sternotomy. Thoracic spinal fusion hardware. IMPRESSION: Findings suggestive of congestive heart failure with cardiomegaly, layering small to moderate bilateral pleural effusions, and suspected interstitial  and alveolar pulmonary edema. Superimposed infection at the bases is not excluded. Electronically Signed   By: Feliberto Harts MD   On: 10/13/2020 13:27    Procedures Procedures   Medications Ordered in ED Medications  furosemide (LASIX) injection 60 mg (60 mg Intravenous Given 10/13/20 1356)    ED Course  I have reviewed the triage vital signs and the nursing notes.  Pertinent labs & imaging results that were available during my care of the patient were reviewed by me and considered in my medical decision making (see chart for details).  Clinical Course as of 10/27/20 1459  Thu Oct 13, 2020  1444 Called to patient's room regarding intermittent episodes of worsening bradycardia down into the high 20s to low 30s.  Patient is asymptomatic.  His blood pressure is normotensive at 125/43 with a MAP of 69.  Patient has approximately 800 mL of urine in Foley.  We will continue to watch output and monitor for signs of hypotension. [VC]  1520 BL 2L [place] on 5L now. History of CHF. CXR looks like intersis edema.  Needs admission for CHF exacerbation. Waiting on CMP and trop. Got 60 IV lasix Admit to unassigned  [ZB]    Clinical Course User Index [VC] Derrel Nip,  MD [ZB] Ardeen FillersBuchanan, Zachary, DO   MDM Rules/Calculators/A&P                          80 year old male presenting to the emergency department with increased oxygen requirement.  Has history of heart failure with LVEF 45-50% is compliant with his heart failure medications.  Feels like he has been gaining weight.  In the emergency department he was on 6 L nasal cannula satting 97%.  He was hypertensive and bradycardic.  Chest x-ray showed pulmonary edema concerning for heart failure exacerbation.  EKG showed atrial fibrillation.  CBC showed WBC within normal limits, patient is anemic with a hemoglobin of 8.5.  Patient given 60 mg IV Lasix and we are currently monitoring urine output.  BNP elevated at 245 but below previous BNP levels.   Chemistry panel pending at this time.  Will likely need admission for heart failure management. Final Clinical Impression(s) / ED Diagnoses Final diagnoses:  None    Rx / DC Orders ED Discharge Orders     None        Derrel Nipresenzo, Victor, MD 10/13/20 1507    Milagros Lollykstra, Richard S, MD 10/15/20 [id number]    Derrel Nipresenzo, Victor, MD 10/27/20 1501    Milagros Lollykstra, Richard S, MD 11/01/20 [phone]

## 2020-10-13 NOTE — ED Notes (Signed)
Patient transported to X-ray 

## 2020-10-13 NOTE — ED Notes (Signed)
Pt daughter at the bedside she and the pt both report that the pt does have a HR that baseline drops into the 30's and 40's

## 2020-10-13 NOTE — ED Provider Notes (Signed)
  Physical Exam  BP (!) 126/49   Pulse (!) 45   Temp 98 F (36.7 C) (Oral)   Resp 18   SpO2 100%    ED Course/Procedures   Clinical Course as of 10/13/20 1649  Thu Oct 13, 2020  1444 Called to patient's room regarding intermittent episodes of worsening bradycardia down into the high 20s to low 30s.  Patient is asymptomatic.  His blood pressure is normotensive at 125/43 with a MAP of 69.  Patient has approximately 800 mL of urine in Foley.  We will continue to watch output and monitor for signs of hypotension. [VC]  1520 BL 2L [place] on 5L now. History of CHF. CXR looks like intersis edema.  Needs admission for CHF exacerbation. Waiting on CMP and trop. Got 60 IV lasix Admit to unassigned  [ZB]    Clinical Course User Index [VC] Derrel Nip, MD [ZB] Ardeen Fillers, DO    Procedures  MDM   Lab work was reviewed by myself and considered in the decision making.  Patient admitted to medicine for further tx.      Ardeen Fillers, DO 10/15/20 0118    Benjiman Core, MD 10/20/20 [phone]

## 2020-10-14 ENCOUNTER — Inpatient Hospital Stay (HOSPITAL_COMMUNITY): Payer: Medicare Other

## 2020-10-14 DIAGNOSIS — N1832 Chronic kidney disease, stage 3b: Secondary | ICD-10-CM

## 2020-10-14 DIAGNOSIS — I10 Essential (primary) hypertension: Secondary | ICD-10-CM

## 2020-10-14 DIAGNOSIS — I5033 Acute on chronic diastolic (congestive) heart failure: Secondary | ICD-10-CM | POA: Diagnosis not present

## 2020-10-14 DIAGNOSIS — E1159 Type 2 diabetes mellitus with other circulatory complications: Secondary | ICD-10-CM

## 2020-10-14 LAB — BASIC METABOLIC PANEL
Anion gap: 7 (ref 5–15)
BUN: 57 mg/dL — ABNORMAL HIGH (ref 8–23)
CO2: 38 mmol/L — ABNORMAL HIGH (ref 22–32)
Calcium: 8.7 mg/dL — ABNORMAL LOW (ref 8.9–10.3)
Chloride: 93 mmol/L — ABNORMAL LOW (ref 98–111)
Creatinine, Ser: 1.51 mg/dL — ABNORMAL HIGH (ref 0.61–1.24)
GFR, Estimated: 46 mL/min — ABNORMAL LOW (ref >60)
Glucose, Bld: 118 mg/dL — ABNORMAL HIGH (ref 70–99)
Potassium: 4.7 mmol/L (ref 3.5–5.1)
Sodium: 138 mmol/L (ref 135–145)

## 2020-10-14 LAB — ECHOCARDIOGRAM COMPLETE
AR max vel: 1.16 cm2
AV Area VTI: 1.36 cm2
AV Area mean vel: 1.14 cm2
AV Mean grad: 6 mmHg
AV Peak grad: 9.9 mmHg
Ao pk vel: 1.57 m/s
Area-P 1/2: 3.99 cm2
MV VTI: 1.6 cm2
S' Lateral: 3.3 cm
Weight: 3040.58 oz

## 2020-10-14 LAB — HEMOGLOBIN A1C
Hgb A1c MFr Bld: 5.7 % — ABNORMAL HIGH (ref 4.8–5.6)
Mean Plasma Glucose: 117 mg/dL

## 2020-10-14 LAB — GLUCOSE, CAPILLARY
Glucose-Capillary: 87 mg/dL (ref 70–99)
Glucose-Capillary: 106 mg/dL — ABNORMAL HIGH (ref 70–99)
Glucose-Capillary: 162 mg/dL — ABNORMAL HIGH (ref 70–99)
Glucose-Capillary: 131 mg/dL — ABNORMAL HIGH (ref 70–99)

## 2020-10-14 LAB — SARS CORONAVIRUS 2 (TAT 6-24 HRS): SARS Coronavirus 2: NEGATIVE

## 2020-10-14 LAB — TROPONIN I (HIGH SENSITIVITY): Troponin I (High Sensitivity): 25 ng/L — ABNORMAL HIGH (ref <18)

## 2020-10-14 MED ORDER — ORAL CARE MOUTH RINSE
15.0000 mL | Freq: Two times a day (BID) | OROMUCOSAL | Status: DC
  Administered 2020-10-14 – 2020-10-21 (×16): 15 mL via OROMUCOSAL

## 2020-10-14 MED ORDER — TRAMADOL HCL 50 MG PO TABS
50.0000 mg | ORAL_TABLET | Freq: Three times a day (TID) | ORAL | Status: DC | PRN
Start: 2020-10-14 — End: 2020-10-22
  Administered 2020-10-16: 50 mg via ORAL
  Filled 2020-10-14 (×2): qty 1

## 2020-10-14 MED ORDER — QUETIAPINE FUMARATE 50 MG PO TABS: 76.0000 mg | ORAL_TABLET | Freq: Every day | ORAL | Status: DC

## 2020-10-14 MED ORDER — CHLORHEXIDINE GLUCONATE 0.12 % MT SOLN
15.0000 mL | Freq: Two times a day (BID) | OROMUCOSAL | Status: DC
  Administered 2020-10-14 – 2020-10-21 (×14): 15 mL via OROMUCOSAL
  Filled 2020-10-14 (×14): qty 15

## 2020-10-14 MED ORDER — SENNOSIDES-DOCUSATE SODIUM 8.6-50 MG PO TABS
1.0000 | ORAL_TABLET | Freq: Two times a day (BID) | ORAL | Status: DC
  Administered 2020-10-14 – 2020-10-21 (×14): 1 via ORAL
  Filled 2020-10-14 (×14): qty 1

## 2020-10-14 MED ORDER — PERFLUTREN LIPID MICROSPHERE
1.0000 mL | INTRAVENOUS | Status: AC | PRN
  Administered 2020-10-14: 5 mL via INTRAVENOUS
  Filled 2020-10-14: qty 10

## 2020-10-14 MED ORDER — CHLORHEXIDINE GLUCONATE CLOTH 2 % EX PADS
6.0000 | MEDICATED_PAD | Freq: Every day | CUTANEOUS | Status: DC
  Administered 2020-10-14 – 2020-10-21 (×8): 6 via TOPICAL

## 2020-10-14 NOTE — Evaluation (Signed)
Physical Therapy Evaluation Patient Details Name: Isaiah [name] MRN: [id number] DOB: 1940/08/10 Today's Date: 10/14/2020   History of Present Illness  Patient is a 80 y/o male who presents on 6/2 from [facility] nursing home due to SOB. CXR- pulmonary edema with concern for CHF exacerbation. EKG- A-fib. PMH includes CHF, DM2, CAD, A-fib, CKD, CVA, PVD, MI, DVT, dementia, HTN.  Clinical Impression  Patient presents with generalized weakness, back pain, baseline cognitive deficits, decreased activity tolerance and impaired mobility s/p above. Pt not a reliable historian but per chart has been at SNF working with therapy to get stronger so he can go home. Today, pt requires Min A for transfers and taking a few steps to get to chair with use of RW for support. Oriented x3 today with delayed processing and response time. Pain is pt's biggest limiting factor today in his back. HR bradycardic in mid 40s throughout. Would benefit from return to SNF to maximize independence and mobility prior to return home. Will follow.    Follow Up Recommendations SNF;Supervision for mobility/OOB (return to SNF to finish rehab)    Equipment Recommendations  None recommended by PT    Recommendations for Other Services       Precautions / Restrictions Precautions Precautions: Fall Restrictions Weight Bearing Restrictions: No      Mobility  Bed Mobility Overal bed mobility: Needs Assistance Bed Mobility: Supine to Sit     Supine to sit: HOB elevated;Min assist     General bed mobility comments: Use of rail and Min A with trunk to get to EOB.    Transfers Overall transfer level: Needs assistance Equipment used: Rolling walker (2 wheeled) Transfers: Sit to/from Stand Sit to Stand: Min assist         General transfer comment: Min A to power to standing with cues for hand placement/technique. FLexed trunk, limited by pain in standing.  Ambulation/Gait Ambulation/Gait assistance: Min assist Gait  Distance (Feet): 3 Feet Assistive device: Rolling walker (2 wheeled) Gait Pattern/deviations: Trunk flexed;Decreased step length - right;Decreased step length - left Gait velocity: decreased   General Gait Details: Able to take a few steps to get to chair with Min A for balance due to posterior bias.  Stairs            Wheelchair Mobility    Modified Rankin (Stroke Patients Only)       Balance Overall balance assessment: Needs assistance Sitting-balance support: Feet supported;No upper extremity supported Sitting balance-Leahy Scale: Fair Sitting balance - Comments: CLose Min guard for safety; rocking back and forth due to back pain.   Standing balance support: During functional activity Standing balance-Leahy Scale: Poor Standing balance comment: Requires UE support in standing and Min A for dynamic tasks due to posterior bias.,                             Pertinent Vitals/Pain Pain Assessment: Faces Faces Pain Scale: Hurts whole lot Pain Location: back with movement Pain Descriptors / Indicators: Moaning;Sore;Grimacing;Discomfort Pain Intervention(s): Monitored during session;Limited activity within patient's tolerance;Repositioned    Home Living Family/patient expects to be discharged to:: Skilled nursing facility                      Prior Function Level of Independence: Needs assistance         Comments: Pt not the best historian; states no to walking but states he was working with therapy at [facility].  Hand Dominance   Dominant Hand: Right    Extremity/Trunk Assessment   Upper Extremity Assessment Upper Extremity Assessment: Defer to OT evaluation    Lower Extremity Assessment Lower Extremity Assessment: Generalized weakness       Communication   Communication: HOH  Cognition Arousal/Alertness: Awake/alert Behavior During Therapy: WFL for tasks assessed/performed Overall Cognitive Status: No family/caregiver present to  determine baseline cognitive functioning                                 General Comments: Oriented to self, place and date. Thinks he is here for his back pain. Delayed processing and response time. HOH so needs repetition at times. Distracted by pain.      General Comments General comments (skin integrity, edema, etc.): HR bradycardic in mid 40s throughouts.    Exercises     Assessment/Plan    PT Assessment Patient needs continued PT services  PT Problem List Decreased strength;Decreased balance;Decreased cognition;Pain;Decreased activity tolerance;Decreased mobility       PT Treatment Interventions Therapeutic exercise;Gait training;Therapeutic activities;DME instruction;Functional mobility training;Balance training;Patient/family education    PT Goals (Current goals can be found in the Care Plan section)  Acute Rehab PT Goals Patient Stated Goal: to make this pain go away PT Goal Formulation: With patient Time For Goal Achievement: 10/28/20 Potential to Achieve Goals: Good    Frequency Min 2X/week   Barriers to discharge        Co-evaluation               AM-PAC PT "6 Clicks" Mobility  Outcome Measure Help needed turning from your back to your side while in a flat bed without using bedrails?: A Little Help needed moving from lying on your back to sitting on the side of a flat bed without using bedrails?: A Little Help needed moving to and from a bed to a chair (including a wheelchair)?: A Little Help needed standing up from a chair using your arms (e.g., wheelchair or bedside chair)?: A Little Help needed to walk in hospital room?: A Little Help needed climbing 3-5 steps with a railing? : A Lot 6 Click Score: 17    End of Session Equipment Utilized During Treatment: Gait belt Activity Tolerance: Patient limited by pain Patient left: in chair;with call bell/phone within reach;with chair alarm set Nurse Communication: Mobility status PT Visit  Diagnosis: Pain;Muscle weakness (generalized) (M62.81) Pain - part of body:  (back)    Time: 7672-0947 PT Time Calculation (min) (ACUTE ONLY): 18 min   Charges:   PT Evaluation $PT Eval Moderate Complexity: 1 Mod          Vale Haven, PT, DPT Acute Rehabilitation Services Pager [phone] Office [phone]      Blake Divine A Lanier Ensign 10/14/2020, 9:57 AM

## 2020-10-14 NOTE — Progress Notes (Signed)
[facility] PROGRESS NOTE    Progress Note  Isaiah [name]  WJX:[id number] DOB: 02-Dec-1940 DOA: 10/13/2020 PCP: [place], [name], MD     Brief Narrative:   Isaiah [name] is an 80 y.o. male past medical history significant for chronic diastolic heart failure, atrial fibrillation not on anticoagulation due to intracranial hemorrhage, essential hypertension, diabetes mellitus type 2, chronic kidney disease stage IIIb fracture of L1 obstructive sleep apnea who has been at a skilled nursing facility for the past several weeks who started to feel short of breath prior to coming to the hospital.  Chest x-ray showed pulmonary edema heart rate in the 50s BNP of 245   Assessment/Plan:   Acute on chronic heart failure with preserved ejection fraction (HFpEF) (HCC) Started on supplemental oxygen to keep saturations greater than 92%. Going back through the chart there is possibly one of the lowest weight he ever been documented.  Is ranging around 86 kg. Chest x-ray shows small pulmonary infiltrates with bilateral pleural effusions. Twelve-lead EKG showed A. fib rate controlled right axis deviation no T wave abnormalities. He was started on IV Lasix negative about 2 L creatinine and blood pressure has held steady. Continue continue IV Lasix monitor strict I's and O's, monitor electrolytes and replete as needed.  Chronic atrial fibrillation (HCC) with slow ventricular rate: Not a candidate for anticoagulation due to intracranial bleed. Rate controlled continue current regimen.  Essential hypertension: On Norvasc and diuretics, not on a beta-blocker due to his slow ventricular rate blood pressure seems to be fairly controlled.  Type 2 diabetes mellitus with vascular disease (HCC) Continue current home medications blood glucose fairly well controlled.  CKD (chronic kidney disease), stage III (HCC) Creatinine at baseline noted.  Stage II pressure sacral decubitus ulcer present on  admission  RN Pressure Injury Documentation: Pressure Injury 03/14/20 Buttocks Left Stage 2 -  Partial thickness loss of dermis presenting as a shallow open injury with a red, pink wound bed without slough. (Active)  03/14/20 2300  Location: Buttocks  Location Orientation: Left  Staging: Stage 2 -  Partial thickness loss of dermis presenting as a shallow open injury with a red, pink wound bed without slough.  Wound Description (Comments):   Present on Admission: Yes     Pressure Injury 03/28/20 Ear Right Stage 2 -  Partial thickness loss of dermis presenting as a shallow open injury with a red, pink wound bed without slough. (Active)  03/28/20 1100  Location: Ear  Location Orientation: Right  Staging: Stage 2 -  Partial thickness loss of dermis presenting as a shallow open injury with a red, pink wound bed without slough.  Wound Description (Comments):   Present on Admission: No    Estimated body mass index is 28.06 kg/m as calculated from the following:   Height as of 05/20/20: 5\' 9"  (1.753 m).   Weight as of this encounter: 86.2 kg.  DVT prophylaxis: None Family Communication:none Status is: Inpatient  Remains inpatient appropriate because:Hemodynamically unstable   Dispo: The patient is from: SNF              Anticipated d/c is to: SNF              Patient currently is not medically stable to d/c.   Difficult to place patient No        Code Status:     Code Status Orders  (From admission, onward)         Start  Ordered   10/13/20 2003  Full code  Continuous        10/13/20 2002        Code Status History    Date Active Date Inactive Code Status Order ID Comments User Context   05/20/2020 1859 06/10/2020 2341 Full Code [id number]  Jaci Standard, DO ED   03/14/2020 1427 04/05/2020 1944 Full Code [id number]  Jonah Blue, MD ED   12/12/2018 2053 12/16/2018 2226 Full Code [id number]  Jadene Pierini, MD Inpatient   08/21/2016 2127 08/23/2016 1744 Full Code  [id number]  Dorothea Ogle, MD Inpatient   08/11/2016 0553 08/14/2016 2025 Full Code [id number]  Briscoe Deutscher, MD ED   08/06/2016 0004 08/09/2016 2212 Full Code [id number]  Eduard Clos, MD ED   Advance Care Planning Activity    Advance Directive Documentation   Flowsheet Row Most Recent Value  Type of Advance Directive Living will, Healthcare Power of Attorney  Pre-existing out of facility DNR order (yellow form or pink MOST form) --  "MOST" Form in Place? --        IV Access:    Peripheral IV   Procedures and diagnostic studies:   DG Chest 2 View  Result Date: 10/13/2020 CLINICAL DATA:  Shortness of breath.  CHF. EXAM: CHEST - 2 VIEW COMPARISON:  06/02/2020. FINDINGS: Enlarged cardiac silhouette. Calcific atherosclerosis of the aorta small to moderate layering bilateral pleural effusions. Diffuse interstitial prominence with patchy airspace opacities. Postsurgical changes of CABG with median sternotomy. Thoracic spinal fusion hardware. IMPRESSION: Findings suggestive of congestive heart failure with cardiomegaly, layering small to moderate bilateral pleural effusions, and suspected interstitial and alveolar pulmonary edema. Superimposed infection at the bases is not excluded. Electronically Signed   By: Feliberto Harts MD   On: 10/13/2020 13:27     Medical Consultants:    None.   Subjective:    Isaiah [name] relates his breathing is better than yesterday.  Objective:    Vitals:   10/14/20 0335 10/14/20 0400 10/14/20 0500 10/14/20 0736  BP: (!) 109/42   (!) 126/52  Pulse: (!) 42   (!) 44  Resp: 18 17 17 17   Temp: 97.7 F (36.5 C)   97.6 F (36.4 C)  TempSrc: Oral   Oral  SpO2: 95%   95%  Weight:       SpO2: 95 % O2 Flow Rate (L/min): 4 L/min   Intake/Output Summary (Last 24 hours) at 10/14/2020 0805 Last data filed at 10/14/2020 0236 Gross per 24 hour  Intake 480 ml  Output 2200 ml  Net -1720 ml   Filed Weights   10/14/20 0150  Weight: 86.2 kg     Exam: General exam: In no acute distress. Respiratory system: Good air movement and clear to auscultation. Cardiovascular system: S1 & S2 heard, RRR.  Positive hepatojugular reflux Gastrointestinal system: Abdomen is nondistended, soft and nontender.  Extremities: No pedal edema. Skin: No rashes, lesions or ulcers Psychiatry: Judgement and insight appear normal. Mood & affect appropriate.    Data Reviewed:    Labs: Basic Metabolic Panel: Recent Labs  Lab 10/13/20 1515 10/14/20 0048  NA 136 138  K 4.9 4.7  CL 91* 93*  CO2 36* 38*  GLUCOSE 114* 118*  BUN 56* 57*  CREATININE 1.54* 1.51*  CALCIUM 8.9 8.7*   GFR Estimated Creatinine Clearance: 42.4 mL/min (A) (by C-G formula based on SCr of 1.51 mg/dL (H)). Liver Function Tests: Recent Labs  Lab 10/13/20 1515  AST 19  ALT 14  ALKPHOS 93  BILITOT 0.5  PROT 7.5  ALBUMIN 2.9*   No results for input(s): LIPASE, AMYLASE in the last 168 hours. No results for input(s): AMMONIA in the last 168 hours. Coagulation profile No results for input(s): INR, PROTIME in the last 168 hours. COVID-19 Labs  No results for input(s): DDIMER, FERRITIN, LDH, CRP in the last 72 hours.  Lab Results  Component Value Date   SARSCOV2NAA NEGATIVE 10/13/2020   SARSCOV2NAA NEGATIVE 05/20/2020   SARSCOV2NAA NEGATIVE 03/14/2020   SARSCOV2NAA NEGATIVE 12/13/2018    CBC: Recent Labs  Lab 10/13/20 1255  WBC 8.1  NEUTROABS 5.6  HGB 8.5*  HCT 29.7*  MCV 102.1*  PLT 228   Cardiac Enzymes: No results for input(s): CKTOTAL, CKMB, CKMBINDEX, TROPONINI in the last 168 hours. BNP (last 3 results) No results for input(s): PROBNP in the last 8760 hours. CBG: Recent Labs  Lab 10/13/20 2128 10/14/20 0538  GLUCAP 86 87   D-Dimer: No results for input(s): DDIMER in the last 72 hours. Hgb A1c: No results for input(s): HGBA1C in the last 72 hours. Lipid Profile: No results for input(s): CHOL, HDL, LDLCALC, TRIG, CHOLHDL, LDLDIRECT in  the last 72 hours. Thyroid function studies: No results for input(s): TSH, T4TOTAL, T3FREE, THYROIDAB in the last 72 hours.  Invalid input(s): FREET3 Anemia work up: No results for input(s): VITAMINB12, FOLATE, FERRITIN, TIBC, IRON, RETICCTPCT in the last 72 hours. Sepsis Labs: Recent Labs  Lab 10/13/20 1255  WBC 8.1   Microbiology Recent Results (from the past 240 hour(s))  SARS CORONAVIRUS 2 (TAT 6-24 HRS) Nasopharyngeal Nasopharyngeal Swab     Status: None   Collection Time: 10/13/20  8:04 PM   Specimen: Nasopharyngeal Swab  Result Value Ref Range Status   SARS Coronavirus 2 NEGATIVE NEGATIVE Final    Comment: (NOTE) SARS-CoV-2 target nucleic acids are NOT DETECTED.  The SARS-CoV-2 RNA is generally detectable in upper and lower respiratory specimens during the acute phase of infection. Negative results do not preclude SARS-CoV-2 infection, do not rule out co-infections with other pathogens, and should not be used as the sole basis for treatment or other patient management decisions. Negative results must be combined with clinical observations, patient history, and epidemiological information. The expected result is Negative.  Fact Sheet for Patients: HairSlick.no  Fact Sheet for Healthcare Providers: quierodirigir.com  This test is not yet approved or cleared by the [place] FDA and  has been authorized for detection and/or diagnosis of SARS-CoV-2 by FDA under an Emergency Use Authorization (EUA). This EUA will remain  in effect (meaning this test can be used) for the duration of the COVID-19 declaration under Se ction 564(b)(1) of the Act, 21 U.S.C. section 360bbb-3(b)(1), unless the authorization is terminated or revoked sooner.  Performed at [facility], [address], [place], [place] [address]      Medications:   . amLODipine  10 mg Oral Daily  . chlorhexidine  15 mL Mouth Rinse BID  .  Chlorhexidine Gluconate Cloth  6 each Topical Daily  . empagliflozin  10 mg Oral Daily  . ferrous sulfate  325 mg Oral BID WC  . furosemide  60 mg Intravenous Q12H  . gabapentin  300 mg Oral TID  . heparin  5,000 Units Subcutaneous Q8H  . insulin aspart  0-5 Units Subcutaneous QHS  . insulin aspart  0-9 Units Subcutaneous TID WC  . lidocaine  1 patch Transdermal Q24H  . mouth rinse  15 mL  Mouth Rinse q12n4p  . methocarbamol  500 mg Oral BID  . polyethylene glycol  17 g Oral BID  . potassium chloride  20 mEq Oral Daily  . QUEtiapine  75 mg Oral QHS  . sacubitril-valsartan  1 tablet Oral BID  . sildenafil  20 mg Oral TID  . sodium chloride flush  3 mL Intravenous Q12H  . traZODone  25 mg Oral QHS   Continuous Infusions: . sodium chloride        LOS: 1 day   Marinda Elk  [facility]  10/14/2020, 8:05 AM

## 2020-10-15 LAB — GLUCOSE, CAPILLARY
Glucose-Capillary: 96 mg/dL (ref 70–99)
Glucose-Capillary: 103 mg/dL — ABNORMAL HIGH (ref 70–99)
Glucose-Capillary: 133 mg/dL — ABNORMAL HIGH (ref 70–99)
Glucose-Capillary: 151 mg/dL — ABNORMAL HIGH (ref 70–99)

## 2020-10-15 LAB — BASIC METABOLIC PANEL
Anion gap: 9 (ref 5–15)
BUN: 51 mg/dL — ABNORMAL HIGH (ref 8–23)
CO2: 38 mmol/L — ABNORMAL HIGH (ref 22–32)
Calcium: 8.5 mg/dL — ABNORMAL LOW (ref 8.9–10.3)
Chloride: 90 mmol/L — ABNORMAL LOW (ref 98–111)
Creatinine, Ser: 1.49 mg/dL — ABNORMAL HIGH (ref 0.61–1.24)
GFR, Estimated: 47 mL/min — ABNORMAL LOW (ref >60)
Glucose, Bld: 114 mg/dL — ABNORMAL HIGH (ref 70–99)
Potassium: 4.8 mmol/L (ref 3.5–5.1)
Sodium: 137 mmol/L (ref 135–145)

## 2020-10-15 NOTE — Progress Notes (Signed)
RT placed patient on CPAP HS. 2L O2 bled in needed. Patient tolerating well at this time.

## 2020-10-15 NOTE — Progress Notes (Signed)
[facility] PROGRESS NOTE    Progress Note  Isaiah [name]  ATF:[id number] DOB: [birth year] DOA: 10/13/2020 PCP: [place], [name], MD     Brief Narrative:   Isaiah [name] is an 80 y.o. male past medical history significant for chronic diastolic heart failure, atrial fibrillation not on anticoagulation due to intracranial hemorrhage, essential hypertension, diabetes mellitus type 2, chronic kidney disease stage IIIb fracture of L1 obstructive sleep apnea who has been at a skilled nursing facility for the past several weeks who started to feel short of breath prior to coming to the hospital.  Chest x-ray showed pulmonary edema heart rate in the 50s BNP of 245   Assessment/Plan:   Acute on chronic heart failure with preserved ejection fraction (HFpEF) (HCC) Started on supplemental oxygen to keep saturations greater than 92%. Try to wean him to room air. Going back through the chart there is possibly one of the lowest weight he ever been documented.  Is ranging around 86 kg. He is negative about 4 L on IV Lasix continue current dose, monitor strict I's and O's and daily weights monitor electrolytes and replete as needed. Physical therapy evaluated patient recommended skilled nursing facility.  Chronic atrial fibrillation (HCC) with slow ventricular rate: Not a candidate for anticoagulation due to intracranial bleed. Rate controlled continue current regimen.  Essential hypertension: On Norvasc and diuretics, not on a beta-blocker due to his slow ventricular rate blood pressure seems to be fairly controlled.  Type 2 diabetes mellitus with vascular disease (HCC) A1c of 5.7,  Blood glucose well controlled.  Continue current regimen.  CKD (chronic kidney disease), stage III (HCC) Creatinine at baseline noted.  Stage II pressure sacral decubitus ulcer present on admission  RN Pressure Injury Documentation: Pressure Injury 03/14/20 Buttocks Left Stage 2 -  Partial thickness loss of  dermis presenting as a shallow open injury with a red, pink wound bed without slough. (Active)  03/14/20 2300  Location: Buttocks  Location Orientation: Left  Staging: Stage 2 -  Partial thickness loss of dermis presenting as a shallow open injury with a red, pink wound bed without slough.  Wound Description (Comments):   Present on Admission: Yes     Pressure Injury 03/28/20 Ear Right Stage 2 -  Partial thickness loss of dermis presenting as a shallow open injury with a red, pink wound bed without slough. (Active)  03/28/20 1100  Location: Ear  Location Orientation: Right  Staging: Stage 2 -  Partial thickness loss of dermis presenting as a shallow open injury with a red, pink wound bed without slough.  Wound Description (Comments):   Present on Admission: No    Estimated body mass index is 27.93 kg/m as calculated from the following:   Height as of 05/20/20: 5\' 9"  (1.753 m).   Weight as of this encounter: 85.8 kg.  DVT prophylaxis: None Family Communication:none Status is: Inpatient  Remains inpatient appropriate because:Hemodynamically unstable   Dispo: The patient is from: SNF              Anticipated d/c is to: SNF              Patient currently is not medically stable to d/c.   Difficult to place patient No        Code Status:     Code Status Orders  (From admission, onward)         Start     Ordered   10/13/20 2003  Full code  Continuous  10/13/20 2002        Code Status History    Date Active Date Inactive Code Status Order ID Comments User Context   05/20/2020 1859 06/10/2020 2341 Full Code [id number]  Jaci Standard, DO ED   03/14/2020 1427 04/05/2020 1944 Full Code [id number]  Jonah Blue, MD ED   12/12/2018 2053 12/16/2018 2226 Full Code [id number]  Jadene Pierini, MD Inpatient   08/21/2016 2127 08/23/2016 1744 Full Code [id number]  Dorothea Ogle, MD Inpatient   08/11/2016 0553 08/14/2016 2025 Full Code [id number]  Briscoe Deutscher, MD ED    08/06/2016 0004 08/09/2016 2212 Full Code [id number]  Eduard Clos, MD ED   Advance Care Planning Activity    Advance Directive Documentation   Flowsheet Row Most Recent Value  Type of Advance Directive Living will, Healthcare Power of Attorney  Pre-existing out of facility DNR order (yellow form or pink MOST form) --  "MOST" Form in Place? --        IV Access:    Peripheral IV   Procedures and diagnostic studies:   DG Chest 2 View  Result Date: 10/13/2020 CLINICAL DATA:  Shortness of breath.  CHF. EXAM: CHEST - 2 VIEW COMPARISON:  06/02/2020. FINDINGS: Enlarged cardiac silhouette. Calcific atherosclerosis of the aorta small to moderate layering bilateral pleural effusions. Diffuse interstitial prominence with patchy airspace opacities. Postsurgical changes of CABG with median sternotomy. Thoracic spinal fusion hardware. IMPRESSION: Findings suggestive of congestive heart failure with cardiomegaly, layering small to moderate bilateral pleural effusions, and suspected interstitial and alveolar pulmonary edema. Superimposed infection at the bases is not excluded. Electronically Signed   By: Feliberto Harts MD   On: 10/13/2020 13:27   ECHOCARDIOGRAM COMPLETE  Result Date: 10/14/2020    ECHOCARDIOGRAM REPORT   Patient Name:   Isaiah [name] [facility] Date of Exam: 10/14/2020 Medical Rec #:  [id number]      Height:       69.0 in Accession #:    [id number]     Weight:       190.0 lb Date of Birth:  09-13-1940      BSA:          2.022 m Patient Age:    80 years       BP:           116/52 mmHg Patient Gender: M              HR:           54 bpm. Exam Location:  Inpatient Procedure: 2D Echo, Cardiac Doppler and Color Doppler Indications:    CHF  History:        Patient has prior history of Echocardiogram examinations, most                 recent 05/22/2020. Previous Myocardial Infarction and CAD; Risk                 Factors:Dyslipidemia and Hypertension.  Sonographer:    Shirlean Kelly Referring Phys:  [id number] Carlton Adam  Sonographer Comments: Image acquisition challenging due to patient body habitus. IMPRESSIONS  1. Left ventricular ejection fraction, by estimation, is 50 to 55%. The left ventricle has low normal function. The left ventricle has no regional wall motion abnormalities. Diastolic function indeterminant due to atrial fibrillation.  2. Right ventricular systolic function is mildly reduced. The right ventricular size is normal. Tricuspid regurgitation signal is inadequate for assessing PA pressure.  3. Left atrial size  was severely dilated.  4. Right atrial size was mildly dilated.  5. The mitral valve is grossly normal. Mild mitral valve regurgitation.  6. The aortic valve is tricuspid. There is mild calcification of the aortic valve. There is mild thickening of the aortic valve. Aortic valve regurgitation is not visualized. Mild aortic valve sclerosis is present, with no evidence of aortic valve stenosis.  7. The inferior vena cava is dilated in size with >50% respiratory variability, suggesting right atrial pressure of 8 mmHg. Comparison(s): Compared to prior TTE in 05/2020, the LVEF appears mildly improved (previously 45-50%) with improved anteroseptal wall motion. FINDINGS  Left Ventricle: Left ventricular ejection fraction, by estimation, is 50 to 55%. The left ventricle has low normal function. The left ventricle has no regional wall motion abnormalities. The left ventricular internal cavity size was normal in size. There is borderline left ventricular hypertrophy. Diastolic function indeterminant due to atrial fibrillation. Right Ventricle: The right ventricular size is normal. Right vetricular wall thickness was not well visualized. Right ventricular systolic function is mildly reduced. Tricuspid regurgitation signal is inadequate for assessing PA pressure. Left Atrium: Left atrial size was severely dilated. Right Atrium: Right atrial size was mildly dilated. Pericardium: There is no  evidence of pericardial effusion. Mitral Valve: The mitral valve is grossly normal. There is mild thickening of the mitral valve leaflet(s). There is mild calcification of the mitral valve leaflet(s). Mild to moderate mitral annular calcification. Mild mitral valve regurgitation. MV peak  gradient, 7.1 mmHg. The mean mitral valve gradient is 2.0 mmHg. Tricuspid Valve: The tricuspid valve is normal in structure. Tricuspid valve regurgitation is mild. Aortic Valve: The aortic valve is tricuspid. There is mild calcification of the aortic valve. There is mild thickening of the aortic valve. Aortic valve regurgitation is not visualized. Mild aortic valve sclerosis is present, with no evidence of aortic valve stenosis. Aortic valve mean gradient measures 6.0 mmHg. Aortic valve peak gradient measures 9.9 mmHg. Aortic valve area, by VTI measures 1.36 cm. Pulmonic Valve: The pulmonic valve was normal in structure. Pulmonic valve regurgitation is trivial. Aorta: The aortic root and ascending aorta are structurally normal, with no evidence of dilitation. Venous: The inferior vena cava is dilated in size with greater than 50% respiratory variability, suggesting right atrial pressure of 8 mmHg. IAS/Shunts: No atrial level shunt detected by color flow Doppler.  LEFT VENTRICLE PLAX 2D LVIDd:         4.50 cm  Diastology LVIDs:         3.30 cm  LV e' lateral:   4.74 cm/s LV PW:         1.00 cm  LV E/e' lateral: 33.5 LV IVS:        1.00 cm LVOT diam:     1.90 cm LV SV:         46 LV SV Index:   23 LVOT Area:     2.84 cm  RIGHT VENTRICLE            IVC RV S prime:     7.01 cm/s  IVC diam: 2.40 cm LEFT ATRIUM            Index       RIGHT ATRIUM           Index LA diam:      4.30 cm  2.13 cm/m  RA Area:     28.30 cm LA Vol (A4C): 111.0 ml 54.91 ml/m RA Volume:   106.00 ml 52.43 ml/m  AORTIC VALVE                    PULMONIC VALVE AV Area (Vmax):    1.16 cm     PV Vmax:       1.03 m/s AV Area (Vmean):   1.14 cm     PV Peak grad:   4.2 mmHg AV Area (VTI):     1.36 cm AV Vmax:           157.00 cm/s AV Vmean:          109.000 cm/s AV VTI:            0.342 m AV Peak Grad:      9.9 mmHg AV Mean Grad:      6.0 mmHg LVOT Vmax:         64.00 cm/s LVOT Vmean:        43.700 cm/s LVOT VTI:          0.164 m LVOT/AV VTI ratio: 0.48  AORTA Ao Root diam: 2.90 cm Ao Asc diam:  3.00 cm MITRAL VALVE MV Area (PHT): 3.99 cm     SHUNTS MV Area VTI:   1.60 cm     Systemic VTI:  0.16 m MV Peak grad:  7.1 mmHg     Systemic Diam: 1.90 cm MV Mean grad:  2.0 mmHg MV Vmax:       1.33 m/s MV Vmean:      53.5 cm/s MV Decel Time: 190 msec MV E velocity: 159.00 cm/s MV A velocity: 50.10 cm/s MV E/A ratio:  3.17 Laurance Flatten MD Electronically signed by Laurance Flatten MD Signature Date/Time: 10/14/2020/3:12:05 PM    Final      Medical Consultants:    None.   Subjective:    Isaiah [name] relates his breathing is significantly better, took off his oxygen this morning.  Objective:    Vitals:   10/15/20 0500 10/15/20 0500 10/15/20 0743 10/15/20 0850  BP:  (!) 99/39 (!) 102/37 (!) 112/51  Pulse:  (!) 52 (!) 46 (!) 55  Resp: 18 20 16 16   Temp:  97.9 F (36.6 C) 98.5 F (36.9 C) 98.3 F (36.8 C)  TempSrc:  Oral Oral Oral  SpO2:  94% 93%   Weight:  85.8 kg     SpO2: 93 % O2 Flow Rate (L/min): 2 L/min   Intake/Output Summary (Last 24 hours) at 10/15/2020 0902 Last data filed at 10/15/2020 0856 Gross per 24 hour  Intake 423 ml  Output 2925 ml  Net -2502 ml   Filed Weights   10/14/20 0150 10/15/20 0500  Weight: 86.2 kg 85.8 kg    Exam: General exam: In no acute distress. Respiratory system: Good air movement and clear to auscultation. Cardiovascular system: S1 & S2 heard, RRR.  Positive hepatojugular reflux Gastrointestinal system: Abdomen is nondistended, soft and nontender.  Extremities: No pedal edema. Skin: No rashes, lesions or ulcers Psychiatry: Judgement and insight appear normal. Mood & affect appropriate.   Data  Reviewed:    Labs: Basic Metabolic Panel: Recent Labs  Lab 10/13/20 1515 10/14/20 0048 10/15/20 0342  NA 136 138 137  K 4.9 4.7 4.8  CL 91* 93* 90*  CO2 36* 38* 38*  GLUCOSE 114* 118* 114*  BUN 56* 57* 51*  CREATININE 1.54* 1.51* 1.49*  CALCIUM 8.9 8.7* 8.5*   GFR Estimated Creatinine Clearance: 42.9 mL/min (A) (by C-G formula based on SCr of 1.49 mg/dL (H)). Liver Function Tests: Recent  Labs  Lab 10/13/20 1515  AST 19  ALT 14  ALKPHOS 93  BILITOT 0.5  PROT 7.5  ALBUMIN 2.9*   No results for input(s): LIPASE, AMYLASE in the last 168 hours. No results for input(s): AMMONIA in the last 168 hours. Coagulation profile No results for input(s): INR, PROTIME in the last 168 hours. COVID-19 Labs  No results for input(s): DDIMER, FERRITIN, LDH, CRP in the last 72 hours.  Lab Results  Component Value Date   SARSCOV2NAA NEGATIVE 10/13/2020   SARSCOV2NAA NEGATIVE 05/20/2020   SARSCOV2NAA NEGATIVE 03/14/2020   SARSCOV2NAA NEGATIVE 12/13/2018    CBC: Recent Labs  Lab 10/13/20 1255  WBC 8.1  NEUTROABS 5.6  HGB 8.5*  HCT 29.7*  MCV 102.1*  PLT 228   Cardiac Enzymes: No results for input(s): CKTOTAL, CKMB, CKMBINDEX, TROPONINI in the last 168 hours. BNP (last 3 results) No results for input(s): PROBNP in the last 8760 hours. CBG: Recent Labs  Lab 10/14/20 0538 10/14/20 1149 10/14/20 1607 10/14/20 2215 10/15/20 0611  GLUCAP 87 106* 162* 131* 96   D-Dimer: No results for input(s): DDIMER in the last 72 hours. Hgb A1c: Recent Labs    10/14/20 0048  HGBA1C 5.7*   Lipid Profile: No results for input(s): CHOL, HDL, LDLCALC, TRIG, CHOLHDL, LDLDIRECT in the last 72 hours. Thyroid function studies: No results for input(s): TSH, T4TOTAL, T3FREE, THYROIDAB in the last 72 hours.  Invalid input(s): FREET3 Anemia work up: No results for input(s): VITAMINB12, FOLATE, FERRITIN, TIBC, IRON, RETICCTPCT in the last 72 hours. Sepsis Labs: Recent Labs  Lab  10/13/20 1255  WBC 8.1   Microbiology Recent Results (from the past 240 hour(s))  SARS CORONAVIRUS 2 (TAT 6-24 HRS) Nasopharyngeal Nasopharyngeal Swab     Status: None   Collection Time: 10/13/20  8:04 PM   Specimen: Nasopharyngeal Swab  Result Value Ref Range Status   SARS Coronavirus 2 NEGATIVE NEGATIVE Final    Comment: (NOTE) SARS-CoV-2 target nucleic acids are NOT DETECTED.  The SARS-CoV-2 RNA is generally detectable in upper and lower respiratory specimens during the acute phase of infection. Negative results do not preclude SARS-CoV-2 infection, do not rule out co-infections with other pathogens, and should not be used as the sole basis for treatment or other patient management decisions. Negative results must be combined with clinical observations, patient history, and epidemiological information. The expected result is Negative.  Fact Sheet for Patients: HairSlick.no  Fact Sheet for Healthcare Providers: quierodirigir.com  This test is not yet approved or cleared by the [place] FDA and  has been authorized for detection and/or diagnosis of SARS-CoV-2 by FDA under an Emergency Use Authorization (EUA). This EUA will remain  in effect (meaning this test can be used) for the duration of the COVID-19 declaration under Se ction 564(b)(1) of the Act, 21 U.S.C. section 360bbb-3(b)(1), unless the authorization is terminated or revoked sooner.  Performed at [facility], [address] [place], [place] [address]      Medications:   . amLODipine  10 mg Oral Daily  . chlorhexidine  15 mL Mouth Rinse BID  . Chlorhexidine Gluconate Cloth  6 each Topical Daily  . empagliflozin  10 mg Oral Daily  . ferrous sulfate  325 mg Oral BID WC  . gabapentin  300 mg Oral TID  . heparin  5,000 Units Subcutaneous Q8H  . insulin aspart  0-5 Units Subcutaneous QHS  . insulin aspart  0-9 Units Subcutaneous TID WC  .  lidocaine  1 patch Transdermal Q24H  . mouth rinse  15 mL Mouth Rinse q12n4p  . methocarbamol  500 mg Oral BID  . polyethylene glycol  17 g Oral BID  . potassium chloride  20 mEq Oral Daily  . QUEtiapine  75 mg Oral QHS  . sacubitril-valsartan  1 tablet Oral BID  . senna-docusate  1 tablet Oral BID  . sildenafil  20 mg Oral TID  . sodium chloride flush  3 mL Intravenous Q12H  . traZODone  25 mg Oral QHS   Continuous Infusions: . sodium chloride        LOS: 2 days   Marinda ElkAbraham Feliz Ortiz  [facility]  10/15/2020, 9:02 AM

## 2020-10-16 DIAGNOSIS — I482 Chronic atrial fibrillation, unspecified: Secondary | ICD-10-CM

## 2020-10-16 LAB — BASIC METABOLIC PANEL
Anion gap: 9 (ref 5–15)
BUN: 48 mg/dL — ABNORMAL HIGH (ref 8–23)
CO2: 36 mmol/L — ABNORMAL HIGH (ref 22–32)
Calcium: 8.6 mg/dL — ABNORMAL LOW (ref 8.9–10.3)
Chloride: 94 mmol/L — ABNORMAL LOW (ref 98–111)
Creatinine, Ser: 1.5 mg/dL — ABNORMAL HIGH (ref 0.61–1.24)
GFR, Estimated: 47 mL/min — ABNORMAL LOW (ref >60)
Glucose, Bld: 119 mg/dL — ABNORMAL HIGH (ref 70–99)
Potassium: 4.9 mmol/L (ref 3.5–5.1)
Sodium: 139 mmol/L (ref 135–145)

## 2020-10-16 LAB — GLUCOSE, CAPILLARY
Glucose-Capillary: 105 mg/dL — ABNORMAL HIGH (ref 70–99)
Glucose-Capillary: 127 mg/dL — ABNORMAL HIGH (ref 70–99)
Glucose-Capillary: 162 mg/dL — ABNORMAL HIGH (ref 70–99)
Glucose-Capillary: 122 mg/dL — ABNORMAL HIGH (ref 70–99)

## 2020-10-16 MED ORDER — FUROSEMIDE 10 MG/ML IJ SOLN
40.0000 mg | Freq: Two times a day (BID) | INTRAMUSCULAR | Status: DC
  Administered 2020-10-16 (×2): 40 mg via INTRAVENOUS
  Filled 2020-10-16 (×2): qty 4

## 2020-10-16 NOTE — TOC Progression Note (Signed)
Transition of Care [facility]) - Progression Note    Patient Details  Name: RENNY REMER MRN: [id number] Date of Birth: 1940/05/23  Transition of [facility]) CM/SW Contact  Patrice [place], [place] Phone Number: [phone] 10/16/2020, 3:23 PM  Clinical Narrative:    CSW spoke with pt's daughter and POA Lupita Leash and she confirmed that pt was from [facility] and she wanted him to return to that location since he is use to that facility.  CSW explained that pt may have misunderstood CSW when he confirmed that he was from [facility].  TOC will follow up with [facility] to ascertain if pt can return to the facility in [place].  TOC team will continue to assist with discharge planning needs.   Expected Discharge Plan: Skilled Nursing Facility Barriers to Discharge: Continued Medical Work up  Expected Discharge Plan and Services Expected Discharge Plan: Skilled Nursing Facility       Living arrangements for the past 2 months: Skilled Nursing Facility                                       Social Determinants of Health (SDOH) Interventions    Readmission Risk Interventions Readmission Risk Prevention Plan 06/08/2020  Transportation Screening Complete  Medication Review Oceanographer) Complete  HRI or Home Care Consult Complete  SW Recovery Care/Counseling Consult Complete  Palliative Care Screening Not Applicable  Skilled Nursing Facility Not Applicable  Some recent data might be hidden

## 2020-10-16 NOTE — Progress Notes (Signed)
[facility] PROGRESS NOTE    Progress Note  Isaiah [name]  LSL:[id number] DOB: [birth year] DOA: 10/13/2020 PCP: [place], [name], MD     Brief Narrative:   Isaiah [name] is an 80 y.o. male past medical history significant for chronic diastolic heart failure, atrial fibrillation not on anticoagulation due to intracranial hemorrhage, essential hypertension, diabetes mellitus type 2, chronic kidney disease stage IIIb fracture of L1 obstructive sleep apnea who has been at a skilled nursing facility for the past several weeks who started to feel short of breath prior to coming to the hospital.  Chest x-ray showed pulmonary edema heart rate in the 50s BNP of 245   Assessment/Plan:   Acute on chronic heart failure with preserved ejection fraction (HFpEF) (HCC) Started on supplemental oxygen to keep saturations greater than 92%. Try to wean the patient to room air, out of bed to chair. He is now 85 kg, he appears to be fluid overloaded on physical exam continue IV Lasix for an additional 24 hours. Continue to monitor strict I's and O's and daily weights monitor and replete electrolytes as needed. Physical therapy evaluated patient recommended skilled nursing facility.  Chronic atrial fibrillation (HCC) with slow ventricular rate: Not a candidate for anticoagulation due to intracranial bleed. Rate controlled continue current regimen.  Essential hypertension: No beta-blocker due to slow ventricular rate continue diuretic therapy. His blood pressure is relatively stable. Will discontinue Norvasc.    Type 2 diabetes mellitus with vascular disease (HCC) A1c of 5.7,  Blood glucose well controlled.  Continue current regimen.  CKD (chronic kidney disease), stage III (HCC) Creatinine at baseline noted.  Stage II pressure sacral decubitus ulcer present on admission  RN Pressure Injury Documentation: Pressure Injury 03/14/20 Buttocks Left Stage 2 -  Partial thickness loss of dermis  presenting as a shallow open injury with a red, pink wound bed without slough. (Active)  03/14/20 2300  Location: Buttocks  Location Orientation: Left  Staging: Stage 2 -  Partial thickness loss of dermis presenting as a shallow open injury with a red, pink wound bed without slough.  Wound Description (Comments):   Present on Admission: Yes     Pressure Injury 03/28/20 Ear Right Stage 2 -  Partial thickness loss of dermis presenting as a shallow open injury with a red, pink wound bed without slough. (Active)  03/28/20 1100  Location: Ear  Location Orientation: Right  Staging: Stage 2 -  Partial thickness loss of dermis presenting as a shallow open injury with a red, pink wound bed without slough.  Wound Description (Comments):   Present on Admission: No    Estimated body mass index is 27.71 kg/m as calculated from the following:   Height as of 05/20/20: 5\' 9"  (1.753 m).   Weight as of this encounter: 85.1 kg.  DVT prophylaxis: None Family Communication:none Status is: Inpatient  Remains inpatient appropriate because:Hemodynamically unstable   Dispo: The patient is from: SNF              Anticipated d/c is to: SNF              Patient currently is not medically stable to d/c.   Difficult to place patient No        Code Status:     Code Status Orders  (From admission, onward)         Start     Ordered   10/13/20 2003  Full code  Continuous  10/13/20 2002        Code Status History    Date Active Date Inactive Code Status Order ID Comments User Context   05/20/2020 1859 06/10/2020 2341 Full Code [id number]  Jaci Standard, DO ED   03/14/2020 1427 04/05/2020 1944 Full Code [id number]  Jonah Blue, MD ED   12/12/2018 2053 12/16/2018 2226 Full Code [id number]  Jadene Pierini, MD Inpatient   08/21/2016 2127 08/23/2016 1744 Full Code [id number]  Dorothea Ogle, MD Inpatient   08/11/2016 0553 08/14/2016 2025 Full Code [id number]  Briscoe Deutscher, MD ED   08/06/2016  0004 08/09/2016 2212 Full Code [id number]  Eduard Clos, MD ED   Advance Care Planning Activity    Advance Directive Documentation   Flowsheet Row Most Recent Value  Type of Advance Directive Living will, Healthcare Power of Attorney  Pre-existing out of facility DNR order (yellow form or pink MOST form) --  "MOST" Form in Place? --        IV Access:    Peripheral IV   Procedures and diagnostic studies:   ECHOCARDIOGRAM COMPLETE  Result Date: 10/14/2020    ECHOCARDIOGRAM REPORT   Patient Name:   Isaiah [name] [facility] Date of Exam: 10/14/2020 Medical Rec #:  [id number]      Height:       69.0 in Accession #:    [id number]     Weight:       190.0 lb Date of Birth:  03-25-1941      BSA:          2.022 m Patient Age:    80 years       BP:           116/52 mmHg Patient Gender: M              HR:           54 bpm. Exam Location:  Inpatient Procedure: 2D Echo, Cardiac Doppler and Color Doppler Indications:    CHF  History:        Patient has prior history of Echocardiogram examinations, most                 recent 05/22/2020. Previous Myocardial Infarction and CAD; Risk                 Factors:Dyslipidemia and Hypertension.  Sonographer:    Shirlean Kelly Referring Phys: [id number] Carlton Adam  Sonographer Comments: Image acquisition challenging due to patient body habitus. IMPRESSIONS  1. Left ventricular ejection fraction, by estimation, is 50 to 55%. The left ventricle has low normal function. The left ventricle has no regional wall motion abnormalities. Diastolic function indeterminant due to atrial fibrillation.  2. Right ventricular systolic function is mildly reduced. The right ventricular size is normal. Tricuspid regurgitation signal is inadequate for assessing PA pressure.  3. Left atrial size was severely dilated.  4. Right atrial size was mildly dilated.  5. The mitral valve is grossly normal. Mild mitral valve regurgitation.  6. The aortic valve is tricuspid. There is mild calcification  of the aortic valve. There is mild thickening of the aortic valve. Aortic valve regurgitation is not visualized. Mild aortic valve sclerosis is present, with no evidence of aortic valve stenosis.  7. The inferior vena cava is dilated in size with >50% respiratory variability, suggesting right atrial pressure of 8 mmHg. Comparison(s): Compared to prior TTE in 05/2020, the LVEF appears mildly improved (previously 45-50%) with improved anteroseptal wall motion.  FINDINGS  Left Ventricle: Left ventricular ejection fraction, by estimation, is 50 to 55%. The left ventricle has low normal function. The left ventricle has no regional wall motion abnormalities. The left ventricular internal cavity size was normal in size. There is borderline left ventricular hypertrophy. Diastolic function indeterminant due to atrial fibrillation. Right Ventricle: The right ventricular size is normal. Right vetricular wall thickness was not well visualized. Right ventricular systolic function is mildly reduced. Tricuspid regurgitation signal is inadequate for assessing PA pressure. Left Atrium: Left atrial size was severely dilated. Right Atrium: Right atrial size was mildly dilated. Pericardium: There is no evidence of pericardial effusion. Mitral Valve: The mitral valve is grossly normal. There is mild thickening of the mitral valve leaflet(s). There is mild calcification of the mitral valve leaflet(s). Mild to moderate mitral annular calcification. Mild mitral valve regurgitation. MV peak  gradient, 7.1 mmHg. The mean mitral valve gradient is 2.0 mmHg. Tricuspid Valve: The tricuspid valve is normal in structure. Tricuspid valve regurgitation is mild. Aortic Valve: The aortic valve is tricuspid. There is mild calcification of the aortic valve. There is mild thickening of the aortic valve. Aortic valve regurgitation is not visualized. Mild aortic valve sclerosis is present, with no evidence of aortic valve stenosis. Aortic valve mean  gradient measures 6.0 mmHg. Aortic valve peak gradient measures 9.9 mmHg. Aortic valve area, by VTI measures 1.36 cm. Pulmonic Valve: The pulmonic valve was normal in structure. Pulmonic valve regurgitation is trivial. Aorta: The aortic root and ascending aorta are structurally normal, with no evidence of dilitation. Venous: The inferior vena cava is dilated in size with greater than 50% respiratory variability, suggesting right atrial pressure of 8 mmHg. IAS/Shunts: No atrial level shunt detected by color flow Doppler.  LEFT VENTRICLE PLAX 2D LVIDd:         4.50 cm  Diastology LVIDs:         3.30 cm  LV e' lateral:   4.74 cm/s LV PW:         1.00 cm  LV E/e' lateral: 33.5 LV IVS:        1.00 cm LVOT diam:     1.90 cm LV SV:         46 LV SV Index:   23 LVOT Area:     2.84 cm  RIGHT VENTRICLE            IVC RV S prime:     7.01 cm/s  IVC diam: 2.40 cm LEFT ATRIUM            Index       RIGHT ATRIUM           Index LA diam:      4.30 cm  2.13 cm/m  RA Area:     28.30 cm LA Vol (A4C): 111.0 ml 54.91 ml/m RA Volume:   106.00 ml 52.43 ml/m  AORTIC VALVE                    PULMONIC VALVE AV Area (Vmax):    1.16 cm     PV Vmax:       1.03 m/s AV Area (Vmean):   1.14 cm     PV Peak grad:  4.2 mmHg AV Area (VTI):     1.36 cm AV Vmax:           157.00 cm/s AV Vmean:          109.000 cm/s AV VTI:  0.342 m AV Peak Grad:      9.9 mmHg AV Mean Grad:      6.0 mmHg LVOT Vmax:         64.00 cm/s LVOT Vmean:        43.700 cm/s LVOT VTI:          0.164 m LVOT/AV VTI ratio: 0.48  AORTA Ao Root diam: 2.90 cm Ao Asc diam:  3.00 cm MITRAL VALVE MV Area (PHT): 3.99 cm     SHUNTS MV Area VTI:   1.60 cm     Systemic VTI:  0.16 m MV Peak grad:  7.1 mmHg     Systemic Diam: 1.90 cm MV Mean grad:  2.0 mmHg MV Vmax:       1.33 m/s MV Vmean:      53.5 cm/s MV Decel Time: 190 msec MV E velocity: 159.00 cm/s MV A velocity: 50.10 cm/s MV E/A ratio:  3.17 Laurance Flatten MD Electronically signed by Laurance Flatten MD  Signature Date/Time: 10/14/2020/3:12:05 PM    Final      Medical Consultants:    None.   Subjective:    Isaiah [name] relates his breathing is significantly better.  Objective:    Vitals:   10/16/20 0024 10/16/20 0029 10/16/20 0318 10/16/20 0827  BP: (!) 107/36  (!) 102/35 (!) 118/44  Pulse: (!) 43  (!) 40 (!) 52  Resp: Temp: 98.4 F (36.9 C)  (!) 97.5 F (36.4 C) 97.8 F (36.6 C)  TempSrc: Oral  Oral Oral  SpO2: 90%  91%   Weight:  85.1 kg     SpO2: 91 % O2 Flow Rate (L/min): 2 L/min   Intake/Output Summary (Last 24 hours) at 10/16/2020 0837 Last data filed at 10/16/2020 0833 Gross per 24 hour  Intake 843 ml  Output 1600 ml  Net -757 ml   Filed Weights   10/14/20 0150 10/15/20 0500 10/16/20 0029  Weight: 86.2 kg 85.8 kg 85.1 kg    Exam: General exam: In no acute distress. Respiratory system: Good air movement and clear to auscultation. Cardiovascular system: S1 & S2 heard, RRR. No JVD. Gastrointestinal system: Abdomen is nondistended, soft and nontender.  Extremities: No pedal edema. Skin: No rashes, lesions or ulcers Psychiatry: Judgement and insight appear normal. Mood & affect appropriate..   Data Reviewed:    Labs: Basic Metabolic Panel: Recent Labs  Lab 10/13/20 1515 10/14/20 0048 10/15/20 0342 10/16/20 0403  NA 136 138 137 139  K 4.9 4.7 4.8 4.9  CL 91* 93* 90* 94*  CO2 36* 38* 38* 36*  GLUCOSE 114* 118* 114* 119*  BUN 56* 57* 51* 48*  CREATININE 1.54* 1.51* 1.49* 1.50*  CALCIUM 8.9 8.7* 8.5* 8.6*   GFR Estimated Creatinine Clearance: 42.5 mL/min (A) (by C-G formula based on SCr of 1.5 mg/dL (H)). Liver Function Tests: Recent Labs  Lab 10/13/20 1515  AST 19  ALT 14  ALKPHOS 93  BILITOT 0.5  PROT 7.5  ALBUMIN 2.9*   No results for input(s): LIPASE, AMYLASE in the last 168 hours. No results for input(s): AMMONIA in the last 168 hours. Coagulation profile No results for input(s): INR, PROTIME in the last 168  hours. COVID-19 Labs  No results for input(s): DDIMER, FERRITIN, LDH, CRP in the last 72 hours.  Lab Results  Component Value Date   SARSCOV2NAA NEGATIVE 10/13/2020   SARSCOV2NAA NEGATIVE 05/20/2020   SARSCOV2NAA NEGATIVE 03/14/2020   SARSCOV2NAA NEGATIVE 12/13/2018  CBC: Recent Labs  Lab 10/13/20 1255  WBC 8.1  NEUTROABS 5.6  HGB 8.5*  HCT 29.7*  MCV 102.1*  PLT 228   Cardiac Enzymes: No results for input(s): CKTOTAL, CKMB, CKMBINDEX, TROPONINI in the last 168 hours. BNP (last 3 results) No results for input(s): PROBNP in the last 8760 hours. CBG: Recent Labs  Lab 10/15/20 0611 10/15/20 1212 10/15/20 1511 10/15/20 2009 10/16/20 0621  GLUCAP 96 103* 133* 151* 105*   D-Dimer: No results for input(s): DDIMER in the last 72 hours. Hgb A1c: Recent Labs    10/14/20 0048  HGBA1C 5.7*   Lipid Profile: No results for input(s): CHOL, HDL, LDLCALC, TRIG, CHOLHDL, LDLDIRECT in the last 72 hours. Thyroid function studies: No results for input(s): TSH, T4TOTAL, T3FREE, THYROIDAB in the last 72 hours.  Invalid input(s): FREET3 Anemia work up: No results for input(s): VITAMINB12, FOLATE, FERRITIN, TIBC, IRON, RETICCTPCT in the last 72 hours. Sepsis Labs: Recent Labs  Lab 10/13/20 1255  WBC 8.1   Microbiology Recent Results (from the past 240 hour(s))  SARS CORONAVIRUS 2 (TAT 6-24 HRS) Nasopharyngeal Nasopharyngeal Swab     Status: None   Collection Time: 10/13/20  8:04 PM   Specimen: Nasopharyngeal Swab  Result Value Ref Range Status   SARS Coronavirus 2 NEGATIVE NEGATIVE Final    Comment: (NOTE) SARS-CoV-2 target nucleic acids are NOT DETECTED.  The SARS-CoV-2 RNA is generally detectable in upper and lower respiratory specimens during the acute phase of infection. Negative results do not preclude SARS-CoV-2 infection, do not rule out co-infections with other pathogens, and should not be used as the sole basis for treatment or other patient management  decisions. Negative results must be combined with clinical observations, patient history, and epidemiological information. The expected result is Negative.  Fact Sheet for Patients: HairSlick.no  Fact Sheet for Healthcare Providers: quierodirigir.com  This test is not yet approved or cleared by the [place] FDA and  has been authorized for detection and/or diagnosis of SARS-CoV-2 by FDA under an Emergency Use Authorization (EUA). This EUA will remain  in effect (meaning this test can be used) for the duration of the COVID-19 declaration under Se ction 564(b)(1) of the Act, 21 U.S.C. section 360bbb-3(b)(1), unless the authorization is terminated or revoked sooner.  Performed at [facility], [address] [place], [place] [address]      Medications:   . amLODipine  10 mg Oral Daily  . chlorhexidine  15 mL Mouth Rinse BID  . Chlorhexidine Gluconate Cloth  6 each Topical Daily  . empagliflozin  10 mg Oral Daily  . ferrous sulfate  325 mg Oral BID WC  . gabapentin  300 mg Oral TID  . heparin  5,000 Units Subcutaneous Q8H  . insulin aspart  0-5 Units Subcutaneous QHS  . insulin aspart  0-9 Units Subcutaneous TID WC  . lidocaine  1 patch Transdermal Q24H  . mouth rinse  15 mL Mouth Rinse q12n4p  . methocarbamol  500 mg Oral BID  . polyethylene glycol  17 g Oral BID  . potassium chloride  20 mEq Oral Daily  . QUEtiapine  75 mg Oral QHS  . sacubitril-valsartan  1 tablet Oral BID  . senna-docusate  1 tablet Oral BID  . sildenafil  20 mg Oral TID  . traZODone  25 mg Oral QHS   Continuous Infusions:     LOS: 3 days   Marinda Elk  [facility]  10/16/2020, 8:37 AM

## 2020-10-16 NOTE — TOC Initial Note (Addendum)
Transition of Care [facility]) - Initial/Assessment Note    Patient Details  Name: Isaiah [name] MRN: [id number] Date of Birth: [birth year]  Transition of Care [facility]) CM/SW Contact:    Bary Castilla, LCSW Phone Number: [phone] 10/16/2020, 11:03 AM  Clinical Narrative:                  CSW met with pt to discuss his dc planning. CSW clarified that pt is from [facility] and the plan is for him to return once he is discharged. Pt explained that he has been there for about 2 weeks.  CSW attempted to call Linus Orn at [facility] however had to leave a message.  CSW attempted to follow up with pt's son Damascus however was unable to leave voice mail.  11:35am-CSW spoke with Olivia Mackie from [facility] and she confirmed that pt actually was at [facility] due to the [place] outbreak. She informed CSW that pt may be able to return to the [place] location due to them getting ready to accept more pts. She explained that it would be best to follow up in the morning. She also stated that pt would need a new COVID before returning.  TOC team will continue to assist with discharge planning needs.   Expected Discharge Plan: Skilled Nursing Facility Barriers to Discharge: Continued Medical Work up   Patient Goals and CMS Choice Patient states their goals for this hospitalization and ongoing recovery are:: To be able return home      Expected Discharge Plan and Services Expected Discharge Plan: [place] arrangements for the past 2 months: [place]                                      Prior Living Arrangements/Services Living arrangements for the past 2 months: [place] Lives with:: Facility Resident Patient language and need for interpreter reviewed:: Yes Do you feel safe going back to the place where you live?: Yes        Care giver support system in place?: Yes (comment)      Activities of Daily Living       Permission Sought/Granted Permission sought to share information with : Family Supports Permission granted to share information with : Yes, Verbal Permission Granted  Share Information with NAME: [place]  Permission granted to share info w AGENCY: [facility]  Permission granted to share info w Relationship: son  Permission granted to share info w Contact Information: [phone]  Emotional Assessment Appearance:: Appears stated age Attitude/Demeanor/Rapport: Engaged Affect (typically observed): Accepting,Adaptable Orientation: : Oriented to Self,Oriented to Place,Oriented to  Time,Oriented to Situation      Admission diagnosis:  Acute on chronic systolic congestive heart failure (HCC) [I50.23] Acute on chronic heart failure with preserved ejection fraction (HFpEF) ([place]) [I50.33] Patient Active Problem List   Diagnosis Date Noted  . Acute on chronic heart failure with preserved ejection fraction (HFpEF) ([place]) 10/13/2020  . Impaired functional mobility, balance, gait, and endurance 07/05/2020  . Acute on chronic respiratory failure with hypoxia and hypercapnia (HCC)   . Acute exacerbation of congestive heart failure ([place]) 05/20/2020  . Biventricular failure ([place])   . Anemia due to GI blood loss   . Acute systolic heart failure ([place])   . Shock ([place])   . Cardiac arrest ([place])   . Sepsis due  to undetermined organism ([place]) 03/14/2020  . Acute cholecystitis 03/14/2020  . Class 1 obesity due to excess calories with body mass index (BMI) of 33.0 to 33.9 in adult 03/14/2020  . Compression fracture of thoracic vertebra, initial encounter ([place]) 12/31/2018  . Pressure injury of skin 12/15/2018  . Thoracic spine fracture ([place]) 12/12/2018  . Chronic gout 10/12/2017  . Chronic diastolic congestive heart failure ([place]) 10/11/2017  . Hyponatremia 08/22/2016  . AKI (acute kidney injury) ([place]) 08/21/2016  . Hypokalemia 08/11/2016  . Occult GI bleeding 08/11/2016  . General weakness  08/11/2016  . GI bleed 08/11/2016  . Weakness   . Non-ST elevation (NSTEMI) myocardial infarction ([place]) 08/08/2016  . Unstable angina ([place]) 08/06/2016  . Chest pain 08/05/2016  . CKD (chronic kidney disease), stage III ([place]) 08/05/2016  . Normochromic normocytic anemia 08/05/2016  . Pain in joint, lower leg 10/01/2014  . Coronary artery disease 04/27/2014  . Chronic atrial fibrillation ([place]) 04/27/2014  . Essential hypertension 04/27/2014  . Hyperlipidemia 04/27/2014  . Type 2 diabetes mellitus with vascular disease ([place]) 04/27/2014  . Obstructive sleep apnea 04/27/2014  . Stroke ([place]) 04/27/2014  . On continuous oral anticoagulation 04/27/2014  . Occlusion and stenosis of carotid artery without mention of cerebral infarction 02/04/2013  . Aftercare following surgery of the circulatory system, [place] 01/09/2012  . Chronic total occlusion of artery of the extremities ([place]) 01/09/2012   PCP:  [name], [name], MD Pharmacy:   [facility] [address] [place] [address] [place] [address] Phone: [phone] Fax: [place], [place] [address] Phone: [phone] Fax: [phone]     Social Determinants of Health (SDOH) Interventions    Readmission Risk Interventions Readmission Risk Prevention Plan 06/08/2020  Transportation Screening Complete  Medication Review ([place]) Complete  HRI or [place] Complete  SW Recovery Care/Counseling Consult Complete  [place] Not Applicable  Some recent data might be hidden

## 2020-10-17 LAB — GLUCOSE, CAPILLARY
Glucose-Capillary: 106 mg/dL — ABNORMAL HIGH (ref 70–99)
Glucose-Capillary: 176 mg/dL — ABNORMAL HIGH (ref 70–99)
Glucose-Capillary: 119 mg/dL — ABNORMAL HIGH (ref 70–99)
Glucose-Capillary: 138 mg/dL — ABNORMAL HIGH (ref 70–99)

## 2020-10-17 LAB — BASIC METABOLIC PANEL
Anion gap: 7 (ref 5–15)
BUN: 43 mg/dL — ABNORMAL HIGH (ref 8–23)
CO2: 39 mmol/L — ABNORMAL HIGH (ref 22–32)
Calcium: 8.9 mg/dL (ref 8.9–10.3)
Chloride: 95 mmol/L — ABNORMAL LOW (ref 98–111)
Creatinine, Ser: 1.35 mg/dL — ABNORMAL HIGH (ref 0.61–1.24)
GFR, Estimated: 53 mL/min — ABNORMAL LOW (ref >60)
Glucose, Bld: 109 mg/dL — ABNORMAL HIGH (ref 70–99)
Potassium: 4.3 mmol/L (ref 3.5–5.1)
Sodium: 141 mmol/L (ref 135–145)

## 2020-10-17 MED ORDER — FUROSEMIDE 10 MG/ML IJ SOLN
60.0000 mg | Freq: Two times a day (BID) | INTRAMUSCULAR | Status: DC
  Administered 2020-10-17 – 2020-10-19 (×5): 60 mg via INTRAVENOUS
  Filled 2020-10-17 (×5): qty 6

## 2020-10-17 NOTE — Progress Notes (Signed)
   10/17/20 1059  Mobility  Activity Refused mobility (Pt declined for unspecified reasons; was asleep upon entry)

## 2020-10-17 NOTE — TOC Progression Note (Addendum)
Transition of Care [facility]) - Progression Note    Patient Details  Name: Isaiah [name] MRN: [id number] Date of Birth: [birth year]  [facility] CM/SW Contact  Ivette Loyal, [place] Phone Number: 10/17/2020, 4:54 PM  Clinical Narrative:    1630: CSW spoke with pt and pt son they have no questions or concerns, pt will DC back to [facility]. CSW contacted Tammy who is covering [place], there was answer VM was left CSW will follow up.   Expected Discharge Plan: Skilled Nursing Facility Barriers to Discharge: Continued Medical Work up  Expected Discharge Plan and Services Expected Discharge Plan: Skilled Nursing Facility       Living arrangements for the past 2 months: Skilled Nursing Facility                                       Social Determinants of Health (SDOH) Interventions    Readmission Risk Interventions Readmission Risk Prevention Plan 06/08/2020  Transportation Screening Complete  Medication Review [place]) Complete  HRI or Home Care Consult Complete  SW Recovery Care/Counseling Consult Complete  Palliative Care Screening Not Applicable  Skilled Nursing Facility Not Applicable  Some recent data might be hidden

## 2020-10-17 NOTE — Care Management Important Message (Signed)
Important Message  Patient Details  Name: MAXWEL MEADOWCROFT MRN: [id number] Date of Birth: 24-Jul-1940   Medicare Important Message Given:  Yes     [name] 10/17/2020, 9:21 AM
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
## 2020-10-17 NOTE — Progress Notes (Signed)
[facility] PROGRESS NOTE    Progress Note  Isaiah [name]  XBL:[id number] DOB: 1941-01-01 DOA: 10/13/2020 PCP: [place], [name], MD     Brief Narrative:   Isaiah [name] is an 80 y.o. male past medical history significant for chronic diastolic heart failure, atrial fibrillation not on anticoagulation due to intracranial hemorrhage, essential hypertension, diabetes mellitus type 2, chronic kidney disease stage IIIb fracture of L1 obstructive sleep apnea who has been at a skilled nursing facility for the past several weeks who started to feel short of breath prior to coming to the hospital.  Chest x-ray showed pulmonary edema heart rate in the 50s BNP of 245   Assessment/Plan:   Acute on chronic heart failure with preserved ejection fraction (HFpEF) (HCC) Still requiring 2 L of oxygen to keep saturations greater 92%. Please try to wean to room air out of bed to chair. He is wearing anywhere from 85 to 86 kg still appears fluid overloaded on physical exam Increase Lasix Continues strict I's and O's and daily weights monitor electrolytes and replete as needed. Basic metabolic panels pending. Physical therapy evaluated the patient recommended skilled nursing facility.  Chronic atrial fibrillation (HCC) with slow ventricular rate: Not a candidate for anticoagulation due to intracranial bleed. Rate controlled on no chronotropic agents.  Essential hypertension: No beta-blocker due to slow ventricular rate, continue diuretic therapy. His blood pressure is relatively stable. Will discontinue Norvasc.    Type 2 diabetes mellitus with vascular disease (HCC) A1c of 5.7,  Blood glucose well controlled.  Continue current regimen.  CKD (chronic kidney disease), stage III (HCC) Creatinine at baseline noted. Basic metabolic panel is pending.  Stage II pressure sacral decubitus ulcer present on admission  RN Pressure Injury Documentation: Pressure Injury 03/14/20 Buttocks Left Stage 2 -   Partial thickness loss of dermis presenting as a shallow open injury with a red, pink wound bed without slough. (Active)  03/14/20 2300  Location: Buttocks  Location Orientation: Left  Staging: Stage 2 -  Partial thickness loss of dermis presenting as a shallow open injury with a red, pink wound bed without slough.  Wound Description (Comments):   Present on Admission: Yes     Pressure Injury 03/28/20 Ear Right Stage 2 -  Partial thickness loss of dermis presenting as a shallow open injury with a red, pink wound bed without slough. (Active)  03/28/20 1100  Location: Ear  Location Orientation: Right  Staging: Stage 2 -  Partial thickness loss of dermis presenting as a shallow open injury with a red, pink wound bed without slough.  Wound Description (Comments):   Present on Admission: No    Estimated body mass index is 28.26 kg/m as calculated from the following:   Height as of 05/20/20: 5\' 9"  (1.753 m).   Weight as of this encounter: 86.8 kg.  DVT prophylaxis: None Family Communication:none Status is: Inpatient  Remains inpatient appropriate because:Hemodynamically unstable   Dispo: The patient is from: SNF              Anticipated d/c is to: SNF              Patient currently is not medically stable to d/c.   Difficult to place patient No        Code Status:     Code Status Orders  (From admission, onward)         Start     Ordered   10/13/20 2003  Full code  Continuous  10/13/20 2002        Code Status History    Date Active Date Inactive Code Status Order ID Comments User Context   05/20/2020 1859 06/10/2020 2341 Full Code [id number]  Jaci Standard, DO ED   03/14/2020 1427 04/05/2020 1944 Full Code [id number]  Jonah Blue, MD ED   12/12/2018 2053 12/16/2018 2226 Full Code [id number]  Jadene Pierini, MD Inpatient   08/21/2016 2127 08/23/2016 1744 Full Code [id number]  Dorothea Ogle, MD Inpatient   08/11/2016 0553 08/14/2016 2025 Full Code [id number]  Briscoe Deutscher, MD ED   08/06/2016 0004 08/09/2016 2212 Full Code [id number]  Eduard Clos, MD ED   Advance Care Planning Activity    Advance Directive Documentation   Flowsheet Row Most Recent Value  Type of Advance Directive Living will, Healthcare Power of Attorney  Pre-existing out of facility DNR order (yellow form or pink MOST form) --  "MOST" Form in Place? --        IV Access:    Peripheral IV   Procedures and diagnostic studies:   No results found.   Medical Consultants:    None.   Subjective:    Isaiah [name] no complaints today.  Objective:    Vitals:   10/16/20 2033 10/16/20 2305 10/17/20 0230 10/17/20 0414  BP:    (!) 138/52  Pulse:  (!) 52  (!) 43  Resp:  16 16 18   Temp:    97.6 F (36.4 C)  TempSrc:    Oral  SpO2: 92% 94%  94%  Weight:   86.8 kg    SpO2: 94 % O2 Flow Rate (L/min): 2 L/min   Intake/Output Summary (Last 24 hours) at 10/17/2020 0818 Last data filed at 10/17/2020 0415 Gross per 24 hour  Intake 860 ml  Output 2426 ml  Net -1566 ml   Filed Weights   10/15/20 0500 10/16/20 0029 10/17/20 0230  Weight: 85.8 kg 85.1 kg 86.8 kg    Exam: General exam: In no acute distress. Respiratory system: Good air movement and clear to auscultation. Cardiovascular system: S1 & S2 heard, RRR. No JVD. Gastrointestinal system: Abdomen is nondistended, soft and nontender.  Extremities: 2+ edema. Skin: No rashes, lesions or ulcers Psychiatry: Judgement and insight appear normal. Mood & affect appropriate.   Data Reviewed:    Labs: Basic Metabolic Panel: Recent Labs  Lab 10/13/20 1515 10/14/20 0048 10/15/20 0342 10/16/20 0403  NA 136 138 137 139  K 4.9 4.7 4.8 4.9  CL 91* 93* 90* 94*  CO2 36* 38* 38* 36*  GLUCOSE 114* 118* 114* 119*  BUN 56* 57* 51* 48*  CREATININE 1.54* 1.51* 1.49* 1.50*  CALCIUM 8.9 8.7* 8.5* 8.6*   GFR Estimated Creatinine Clearance: 42.8 mL/min (A) (by C-G formula based on SCr of 1.5 mg/dL (H)). Liver  Function Tests: Recent Labs  Lab 10/13/20 1515  AST 19  ALT 14  ALKPHOS 93  BILITOT 0.5  PROT 7.5  ALBUMIN 2.9*   No results for input(s): LIPASE, AMYLASE in the last 168 hours. No results for input(s): AMMONIA in the last 168 hours. Coagulation profile No results for input(s): INR, PROTIME in the last 168 hours. COVID-19 Labs  No results for input(s): DDIMER, FERRITIN, LDH, CRP in the last 72 hours.  Lab Results  Component Value Date   SARSCOV2NAA NEGATIVE 10/13/2020   SARSCOV2NAA NEGATIVE 05/20/2020   SARSCOV2NAA NEGATIVE 03/14/2020   SARSCOV2NAA NEGATIVE 12/13/2018    CBC: Recent Labs  Lab  10/13/20 1255  WBC 8.1  NEUTROABS 5.6  HGB 8.5*  HCT 29.7*  MCV 102.1*  PLT 228   Cardiac Enzymes: No results for input(s): CKTOTAL, CKMB, CKMBINDEX, TROPONINI in the last 168 hours. BNP (last 3 results) No results for input(s): PROBNP in the last 8760 hours. CBG: Recent Labs  Lab 10/16/20 0621 10/16/20 1201 10/16/20 1706 10/16/20 2059 10/17/20 0606  GLUCAP 105* 127* 162* 122* 106*   D-Dimer: No results for input(s): DDIMER in the last 72 hours. Hgb A1c: No results for input(s): HGBA1C in the last 72 hours. Lipid Profile: No results for input(s): CHOL, HDL, LDLCALC, TRIG, CHOLHDL, LDLDIRECT in the last 72 hours. Thyroid function studies: No results for input(s): TSH, T4TOTAL, T3FREE, THYROIDAB in the last 72 hours.  Invalid input(s): FREET3 Anemia work up: No results for input(s): VITAMINB12, FOLATE, FERRITIN, TIBC, IRON, RETICCTPCT in the last 72 hours. Sepsis Labs: Recent Labs  Lab 10/13/20 1255  WBC 8.1   Microbiology Recent Results (from the past 240 hour(s))  SARS CORONAVIRUS 2 (TAT 6-24 HRS) Nasopharyngeal Nasopharyngeal Swab     Status: None   Collection Time: 10/13/20  8:04 PM   Specimen: Nasopharyngeal Swab  Result Value Ref Range Status   SARS Coronavirus 2 NEGATIVE NEGATIVE Final    Comment: (NOTE) SARS-CoV-2 target nucleic acids are NOT  DETECTED.  The SARS-CoV-2 RNA is generally detectable in upper and lower respiratory specimens during the acute phase of infection. Negative results do not preclude SARS-CoV-2 infection, do not rule out co-infections with other pathogens, and should not be used as the sole basis for treatment or other patient management decisions. Negative results must be combined with clinical observations, patient history, and epidemiological information. The expected result is Negative.  Fact Sheet for Patients: HairSlick.no  Fact Sheet for Healthcare Providers: quierodirigir.com  This test is not yet approved or cleared by the [place] FDA and  has been authorized for detection and/or diagnosis of SARS-CoV-2 by FDA under an Emergency Use Authorization (EUA). This EUA will remain  in effect (meaning this test can be used) for the duration of the COVID-19 declaration under Se ction 564(b)(1) of the Act, 21 U.S.C. section 360bbb-3(b)(1), unless the authorization is terminated or revoked sooner.  Performed at [facility], [address] [place], [place] [address]      Medications:   . chlorhexidine  15 mL Mouth Rinse BID  . Chlorhexidine Gluconate Cloth  6 each Topical Daily  . empagliflozin  10 mg Oral Daily  . ferrous sulfate  325 mg Oral BID WC  . furosemide  40 mg Intravenous Q12H  . gabapentin  300 mg Oral TID  . heparin  5,000 Units Subcutaneous Q8H  . insulin aspart  0-5 Units Subcutaneous QHS  . insulin aspart  0-9 Units Subcutaneous TID WC  . lidocaine  1 patch Transdermal Q24H  . mouth rinse  15 mL Mouth Rinse q12n4p  . methocarbamol  500 mg Oral BID  . polyethylene glycol  17 g Oral BID  . potassium chloride  20 mEq Oral Daily  . QUEtiapine  75 mg Oral QHS  . sacubitril-valsartan  1 tablet Oral BID  . senna-docusate  1 tablet Oral BID  . sildenafil  20 mg Oral TID  . traZODone  25 mg Oral QHS   Continuous  Infusions:     LOS: 4 days   Marinda Elk  [facility]  10/17/2020, 8:18 AM

## 2020-10-18 ENCOUNTER — Inpatient Hospital Stay (HOSPITAL_COMMUNITY): Payer: Medicare Other

## 2020-10-18 LAB — URINALYSIS, COMPLETE (UACMP) WITH MICROSCOPIC
Bilirubin Urine: NEGATIVE
Glucose, UA: 50 mg/dL — AB
Ketones, ur: NEGATIVE mg/dL
Nitrite: NEGATIVE
Protein, ur: NEGATIVE mg/dL
Specific Gravity, Urine: 1.006 (ref 1.005–1.030)
WBC, UA: >50 WBC/hpf — ABNORMAL HIGH (ref 0–5)
pH: 8 (ref 5.0–8.0)

## 2020-10-18 LAB — BASIC METABOLIC PANEL
Anion gap: 5 (ref 5–15)
BUN: 40 mg/dL — ABNORMAL HIGH (ref 8–23)
CO2: 38 mmol/L — ABNORMAL HIGH (ref 22–32)
Calcium: 8.6 mg/dL — ABNORMAL LOW (ref 8.9–10.3)
Chloride: 95 mmol/L — ABNORMAL LOW (ref 98–111)
Creatinine, Ser: 1.29 mg/dL — ABNORMAL HIGH (ref 0.61–1.24)
GFR, Estimated: 56 mL/min — ABNORMAL LOW (ref >60)
Glucose, Bld: 107 mg/dL — ABNORMAL HIGH (ref 70–99)
Potassium: 4.3 mmol/L (ref 3.5–5.1)
Sodium: 138 mmol/L (ref 135–145)

## 2020-10-18 LAB — COMPREHENSIVE METABOLIC PANEL
ALT: 10 U/L (ref 0–44)
AST: 17 U/L (ref 15–41)
Albumin: 2.8 g/dL — ABNORMAL LOW (ref 3.5–5.0)
Alkaline Phosphatase: 97 U/L (ref 38–126)
Anion gap: 6 (ref 5–15)
BUN: 39 mg/dL — ABNORMAL HIGH (ref 8–23)
CO2: 40 mmol/L — ABNORMAL HIGH (ref 22–32)
Calcium: 8.9 mg/dL (ref 8.9–10.3)
Chloride: 92 mmol/L — ABNORMAL LOW (ref 98–111)
Creatinine, Ser: 1.32 mg/dL — ABNORMAL HIGH (ref 0.61–1.24)
GFR, Estimated: 55 mL/min — ABNORMAL LOW (ref >60)
Glucose, Bld: 120 mg/dL — ABNORMAL HIGH (ref 70–99)
Potassium: 4.9 mmol/L (ref 3.5–5.1)
Sodium: 138 mmol/L (ref 135–145)
Total Bilirubin: 0.3 mg/dL (ref 0.3–1.2)
Total Protein: 7.4 g/dL (ref 6.5–8.1)

## 2020-10-18 LAB — LIPASE, BLOOD: Lipase: 44 U/L (ref 11–51)

## 2020-10-18 LAB — CBC WITH DIFFERENTIAL/PLATELET
Abs Immature Granulocytes: 0.03 10*3/uL (ref 0.00–0.07)
Basophils Absolute: 0.1 10*3/uL (ref 0.0–0.1)
Basophils Relative: 1 %
Eosinophils Absolute: 0.3 10*3/uL (ref 0.0–0.5)
Eosinophils Relative: 3 %
HCT: 32.8 % — ABNORMAL LOW (ref 39.0–52.0)
Hemoglobin: 9.7 g/dL — ABNORMAL LOW (ref 13.0–17.0)
Immature Granulocytes: 0 %
Lymphocytes Relative: 20 %
Lymphs Abs: 2 10*3/uL (ref 0.7–4.0)
MCH: 29.9 pg (ref 26.0–34.0)
MCHC: 29.6 g/dL — ABNORMAL LOW (ref 30.0–36.0)
MCV: 101.2 fL — ABNORMAL HIGH (ref 80.0–100.0)
Monocytes Absolute: 0.7 10*3/uL (ref 0.1–1.0)
Monocytes Relative: 7 %
Neutro Abs: 6.5 10*3/uL (ref 1.7–7.7)
Neutrophils Relative %: 69 %
Platelets: 288 10*3/uL (ref 150–400)
RBC: 3.24 MIL/uL — ABNORMAL LOW (ref 4.22–5.81)
RDW: 16.4 % — ABNORMAL HIGH (ref 11.5–15.5)
WBC: 9.6 10*3/uL (ref 4.0–10.5)
nRBC: 0 % (ref 0.0–0.2)

## 2020-10-18 LAB — GLUCOSE, CAPILLARY
Glucose-Capillary: 97 mg/dL (ref 70–99)
Glucose-Capillary: 179 mg/dL — ABNORMAL HIGH (ref 70–99)
Glucose-Capillary: 156 mg/dL — ABNORMAL HIGH (ref 70–99)
Glucose-Capillary: 109 mg/dL — ABNORMAL HIGH (ref 70–99)

## 2020-10-18 LAB — TROPONIN I (HIGH SENSITIVITY): Troponin I (High Sensitivity): 21 ng/L — ABNORMAL HIGH (ref <18)

## 2020-10-18 LAB — LACTIC ACID, PLASMA: Lactic Acid, Venous: 1.1 mmol/L (ref 0.5–1.9)

## 2020-10-18 MED ORDER — MORPHINE SULFATE (PF) 2 MG/ML IV SOLN
2.0000 mg | Freq: Once | INTRAVENOUS | Status: AC
  Administered 2020-10-18: 2 mg via INTRAVENOUS
  Filled 2020-10-18: qty 1

## 2020-10-18 MED ORDER — MORPHINE SULFATE (PF) 2 MG/ML IV SOLN: 2.0000 mg | INTRAVENOUS | Status: DC | PRN

## 2020-10-18 NOTE — Progress Notes (Signed)
Physical Therapy Treatment Patient Details Name: Isaiah [name] MRN: [id number] DOB: 1941-03-11 Today's Date: 10/18/2020    History of Present Illness Patient is a 80 y/o male who presents on 6/2 from [facility] nursing home due to SOB. CXR- pulmonary edema with concern for CHF exacerbation. EKG- A-fib. PMH includes CHF, DM2, CAD, A-fib, CKD, CVA, PVD, MI, DVT, dementia, HTN.    PT Comments    Patient progressing slowly towards PT goals. Pt limited mainly by pain and fatigue despite being premedicated prior to arrival. Worked on standing bouts from EOB and standing tolerance with use of RW for support. Pt with flexed posture and limited tolerance due to weakness and back pain. Able to take a few steps along side bed with Min A for balance and RW management as well as posterior bias. HR ranging from 40s-50s bpm throughout and Sp02 dropped to 86% on 2L/min 02 [place]. Attempted pt on RA and Sp02 dropped to mid 70s however unsure of accuracy as pt not symptomatic. Continues to be appropriate for SNF. Will follow.   Follow Up Recommendations  SNF;Supervision for mobility/OOB     Equipment Recommendations  None recommended by PT    Recommendations for Other Services       Precautions / Restrictions Precautions Precautions: Fall;Other (comment) Precaution Comments: watch 02 Restrictions Weight Bearing Restrictions: No    Mobility  Bed Mobility Overal bed mobility: Needs Assistance Bed Mobility: Supine to Sit     Supine to sit: Mod assist;HOB elevated     General bed mobility comments: Able to bring LEs to EOB but assist needed with trunk and scooting hips forward, use of rail for support.    Transfers Overall transfer level: Needs assistance Equipment used: Rolling walker (2 wheeled) Transfers: Sit to/from Stand Sit to Stand: Min assist;From elevated surface         General transfer comment: Min A to power to standing with cues for hand placement/technique. Flexed trunk. Stood from  EOB x4-5 for pericare and assist with wash up.  Ambulation/Gait Ambulation/Gait assistance: Min assist Gait Distance (Feet): 5 Feet Assistive device: Rolling walker (2 wheeled) Gait Pattern/deviations: Trunk flexed;Shuffle Gait velocity: decreased   General Gait Details: Able to take a few steps along side bed with help for RW management; flexed posture at hips/back. Posterior bias.   Stairs             Wheelchair Mobility    Modified Rankin (Stroke Patients Only)       Balance Overall balance assessment: Needs assistance Sitting-balance support: Feet supported;No upper extremity supported Sitting balance-Leahy Scale: Fair Sitting balance - Comments: Prefers leaning on RW for back pain sitting EOB.   Standing balance support: During functional activity Standing balance-Leahy Scale: Poor Standing balance comment: Requires UE support in standing and Min A for dynamic tasks due to posterior bias/fatigue. Worked on standing endurance during pericare and wash up.                            Cognition Arousal/Alertness: Awake/alert Behavior During Therapy: WFL for tasks assessed/performed Overall Cognitive Status: No family/caregiver present to determine baseline cognitive functioning                                 General Comments: Seems more alert and appropriate today with regards to cognition. Follows commands well. HOH.      Exercises  General Comments General comments (skin integrity, edema, etc.): HR ranging from 40s-50s bpm throughout and Sp02 dropped to mid 70s on RA and 86% on 2L/min 02 [place].      Pertinent Vitals/Pain Pain Assessment: Faces Faces Pain Scale: Hurts even more Pain Location: back with movement Pain Descriptors / Indicators: Moaning;Sore;Grimacing;Discomfort Pain Intervention(s): Monitored during session;Limited activity within patient's tolerance;Premedicated before session;Repositioned    Home Living                       Prior Function            PT Goals (current goals can now be found in the care plan section) Progress towards PT goals: Progressing toward goals (slowly)    Frequency    Min 2X/week      PT Plan Current plan remains appropriate    Co-evaluation              AM-PAC PT "6 Clicks" Mobility   Outcome Measure  Help needed turning from your back to your side while in a flat bed without using bedrails?: A Little Help needed moving from lying on your back to sitting on the side of a flat bed without using bedrails?: A Lot Help needed moving to and from a bed to a chair (including a wheelchair)?: A Little Help needed standing up from a chair using your arms (e.g., wheelchair or bedside chair)?: A Little Help needed to walk in hospital room?: A Lot Help needed climbing 3-5 steps with a railing? : A Lot 6 Click Score: 15    End of Session Equipment Utilized During Treatment: Gait belt Activity Tolerance: Patient limited by pain Patient left: in bed;with call bell/phone within reach;with nursing/sitter in room Nurse Communication: Mobility status PT Visit Diagnosis: Pain;Muscle weakness (generalized) (M62.81) Pain - part of body:  (back)     Time: 1040-1106 PT Time Calculation (min) (ACUTE ONLY): 26 min  Charges:  $Therapeutic Activity: 23-37 mins                     Vale Haven, PT, DPT Acute Rehabilitation Services Pager [phone] Office [phone]       Blake Divine A Lanier Ensign 10/18/2020, 11:57 AM

## 2020-10-18 NOTE — Progress Notes (Signed)
HOSPITAL MEDICINE OVERNIGHT EVENT NOTE    Notified by nursing the patient has suddenly begun to complain of severe, 10 out of 10 abdominal pain.   Abdominal pain seems to be located in the periumbilical region and left lower quadrant.  Nursing reports that abdomen is diffusely tender and firm to the touch.  These complaints and findings are vague yet concerning and therefore I have ordered a stat abdominal x-ray, lipase, hepatic function panel, urinalysis, lactic acid, troponin and EKG.  We will additionally order 2 mg of IV morphine for associated pain.  We will follow-up on these labs and monitor patient closely.   Marinda Elk  MD [facility]

## 2020-10-18 NOTE — Progress Notes (Signed)
[facility] PROGRESS NOTE    Progress Note  Isaiah [name]  FOY:[id number] DOB: 11-06-1940 DOA: 10/13/2020 PCP: [place], Isaiah [name]     Brief Narrative:   Isaiah [name] is an 80 y.o. male past medical history significant for chronic diastolic heart failure, atrial fibrillation not on anticoagulation due to intracranial hemorrhage, essential hypertension, diabetes mellitus type 2, chronic kidney disease stage IIIb fracture of L1 obstructive sleep apnea who has been at a skilled nursing facility for the past several weeks who started to feel short of breath prior to coming to the hospital.  Chest x-ray showed pulmonary edema heart rate in the 50s BNP of 245   Assessment/Plan:   Acute on chronic heart failure with preserved ejection fraction (HFpEF) (HCC) Still requiring 2 L of oxygen to keep saturations greater 92%. Out of bed to chair try to wean to room air continue BiPAP overnight. Isaiah [name] still appears fluid overloaded on physical exam continue IV Lasix he is negative about 8-1/2 L.  Continue IV Lasix monitor strict I's and O's, urine, continue to appears totally clear. Creatinine and blood pressure holding steady. Physical therapy evaluated the patient and recommended skilled nursing facility.  Chronic atrial fibrillation (HCC) with slow ventricular rate: Not a candidate for anticoagulation due to intracranial bleed. Rate controlled on no chronotropic agents.  Essential hypertension: No beta-blocker due to slow ventricular rate, continue diuretic therapy. His blood pressure is relatively stable. Will discontinue Norvasc.    Type 2 diabetes mellitus with vascular disease (HCC) A1c of 5.7,  Blood glucose well controlled.  Continue current regimen.  CKD (chronic kidney disease), stage III (HCC) Creatinine at baseline noted. Basic metabolic panel is pending.  Stage II pressure sacral decubitus ulcer present on admission  RN Pressure Injury Documentation: Pressure  Injury 03/14/20 Buttocks Left Stage 2 -  Partial thickness loss of dermis presenting as a shallow open injury with a red, pink wound bed without slough. (Active)  03/14/20 2300  Location: Buttocks  Location Orientation: Left  Staging: Stage 2 -  Partial thickness loss of dermis presenting as a shallow open injury with a red, pink wound bed without slough.  Wound Description (Comments):   Present on Admission: Yes     Pressure Injury 03/28/20 Ear Right Stage 2 -  Partial thickness loss of dermis presenting as a shallow open injury with a red, pink wound bed without slough. (Active)  03/28/20 1100  Location: Ear  Location Orientation: Right  Staging: Stage 2 -  Partial thickness loss of dermis presenting as a shallow open injury with a red, pink wound bed without slough.  Wound Description (Comments):   Present on Admission: No    Estimated body mass index is 28.29 [name]/m as calculated from the following:   Height as of 05/20/20: 5\' 9"  (1.753 m).   Weight as of this encounter: 86.9 [name].  DVT prophylaxis: None Family Communication:none Status is: Inpatient  Remains inpatient appropriate because:Hemodynamically unstable   Dispo: The patient is from: SNF              Anticipated d/c is to: SNF              Patient currently is not medically stable to d/c.   Difficult to place patient No        Code Status:     Code Status Orders  (From admission, onward)         Start     Ordered  10/13/20 2003  Full code  Continuous        10/13/20 2002        Code Status History    Date Active Date Inactive Code Status Order ID Comments User Context   05/20/2020 1859 06/10/2020 2341 Full Code [id number]  Jaci StandardRehman, Areeg N, DO ED   03/14/2020 1427 04/05/2020 1944 Full Code [id number]  Jonah BlueYates, Jennifer, [name] ED   12/12/2018 2053 12/16/2018 2226 Full Code [id number]  Jadene Pierinistergard, Thomas A, [name] Inpatient   08/21/2016 2127 08/23/2016 1744 Full Code [id number]  Dorothea OgleMyers, Iskra M, [name] Inpatient   08/11/2016 0553  08/14/2016 2025 Full Code [id number]  Briscoe Deutscherpyd, Timothy S, [name] ED   08/06/2016 0004 08/09/2016 2212 Full Code [id number]  Eduard ClosKakrakandy, Arshad N, [name] ED   Advance Care Planning Activity    Advance Directive Documentation   Flowsheet Row Most Recent Value  Type of Advance Directive Living will, Healthcare Power of Attorney  Pre-existing out of facility DNR order (yellow form or pink MOST form) --  "MOST" Form in Place? --        IV Access:    Peripheral IV   Procedures and diagnostic studies:   No results found.   Medical Consultants:    None.   Subjective:    Isaiah [name] relates his breathing is much better today than yesterday.  Objective:    Vitals:   10/17/20 2232 10/18/20 0424 10/18/20 0532 10/18/20 0600  BP:   (!) 141/45   Pulse: (!) 46  (!) 42   Resp: 14  18   Temp:   97.8 F (36.6 C)   TempSrc:   Oral   SpO2: 95%  (!) 89% 92%  Weight:  86.9 [name]     SpO2: 92 % O2 Flow Rate (L/min): 1 L/min   Intake/Output Summary (Last 24 hours) at 10/18/2020 0855 Last data filed at 10/18/2020 0740 Gross per 24 hour  Intake 360 ml  Output 2100 ml  Net -1740 ml   Filed Weights   10/16/20 0029 10/17/20 0230 10/18/20 0424  Weight: 85.1 [name] 86.8 [name] 86.9 [name]    Exam: General exam: In no acute distress. Respiratory system: Good air movement and clear to auscultation. Cardiovascular system: S1 & S2 heard, RRR. No JVD.  Appears euvolemic Gastrointestinal system: Abdomen is nondistended, soft and nontender.  Extremities: No pedal edema. Skin: No rashes, lesions or ulcers Psychiatry: Judgement and insight appear normal. Mood & affect appropriate.   Data Reviewed:    Labs: Basic Metabolic Panel: Recent Labs  Lab 10/14/20 0048 10/15/20 0342 10/16/20 0403 10/17/20 0909 10/18/20 0423  NA 138 137 139 141 138  K 4.7 4.8 4.9 4.3 4.3  CL 93* 90* 94* 95* 95*  CO2 38* 38* 36* 39* 38*  GLUCOSE 118* 114* 119* 109* 107*  BUN 57* 51* 48* 43* 40*  CREATININE 1.51* 1.49* 1.50*  1.35* 1.29*  CALCIUM 8.7* 8.5* 8.6* 8.9 8.6*   GFR Estimated Creatinine Clearance: 49.9 mL/min (A) (by C-G formula based on SCr of 1.29 mg/dL (H)). Liver Function Tests: Recent Labs  Lab 10/13/20 1515  AST 19  ALT 14  ALKPHOS 93  BILITOT 0.5  PROT 7.5  ALBUMIN 2.9*   No results for input(s): LIPASE, AMYLASE in the last 168 hours. No results for input(s): AMMONIA in the last 168 hours. Coagulation profile No results for input(s): INR, PROTIME in the last 168 hours. COVID-19 Labs  No results for input(s): DDIMER, FERRITIN, LDH, CRP in the last 72 hours.  Lab Results  Component Value Date   SARSCOV2NAA NEGATIVE 10/13/2020   SARSCOV2NAA NEGATIVE 05/20/2020   SARSCOV2NAA NEGATIVE 03/14/2020   SARSCOV2NAA NEGATIVE 12/13/2018    CBC: Recent Labs  Lab 10/13/20 1255  WBC 8.1  NEUTROABS 5.6  HGB 8.5*  HCT 29.7*  MCV 102.1*  PLT 228   Cardiac Enzymes: No results for input(s): CKTOTAL, CKMB, CKMBINDEX, TROPONINI in the last 168 hours. BNP (last 3 results) No results for input(s): PROBNP in the last 8760 hours. CBG: Recent Labs  Lab 10/17/20 0606 10/17/20 1208 10/17/20 1631 10/17/20 2100 10/18/20 0527  GLUCAP 106* 176* 119* 138* 97   D-Dimer: No results for input(s): DDIMER in the last 72 hours. Hgb A1c: No results for input(s): HGBA1C in the last 72 hours. Lipid Profile: No results for input(s): CHOL, HDL, LDLCALC, TRIG, CHOLHDL, LDLDIRECT in the last 72 hours. Thyroid function studies: No results for input(s): TSH, T4TOTAL, T3FREE, THYROIDAB in the last 72 hours.  Invalid input(s): FREET3 Anemia work up: No results for input(s): VITAMINB12, FOLATE, FERRITIN, TIBC, IRON, RETICCTPCT in the last 72 hours. Sepsis Labs: Recent Labs  Lab 10/13/20 1255  WBC 8.1   Microbiology Recent Results (from the past 240 hour(s))  SARS CORONAVIRUS 2 (TAT 6-24 HRS) Nasopharyngeal Nasopharyngeal Swab     Status: None   Collection Time: 10/13/20  8:04 PM   Specimen:  Nasopharyngeal Swab  Result Value Ref Range Status   SARS Coronavirus 2 NEGATIVE NEGATIVE Final    Comment: (NOTE) SARS-CoV-2 target nucleic acids are NOT DETECTED.  The SARS-CoV-2 RNA is generally detectable in upper and lower respiratory specimens during the acute phase of infection. Negative results do not preclude SARS-CoV-2 infection, do not rule out co-infections with other pathogens, and should not be used as the sole basis for treatment or other patient management decisions. Negative results must be combined with clinical observations, patient history, and epidemiological information. The expected result is Negative.  Fact Sheet for Patients: HairSlick.no  Fact Sheet for Healthcare Providers: quierodirigir.com  This test is not yet approved or cleared by the [place] FDA and  has been authorized for detection and/or diagnosis of SARS-CoV-2 by FDA under an Emergency Use Authorization (EUA). This EUA will remain  in effect (meaning this test can be used) for the duration of the COVID-19 declaration under Se ction 564(b)(1) of the Act, 21 U.S.C. section 360bbb-3(b)(1), unless the authorization is terminated or revoked sooner.  Performed at [facility], [address], [place], [place] [address]      Medications:   . chlorhexidine  15 mL Mouth Rinse BID  . Chlorhexidine Gluconate Cloth  6 each Topical Daily  . empagliflozin  10 mg Oral Daily  . ferrous sulfate  325 mg Oral BID WC  . furosemide  60 mg Intravenous Q12H  . gabapentin  300 mg Oral TID  . heparin  5,000 Units Subcutaneous Q8H  . insulin aspart  0-5 Units Subcutaneous QHS  . insulin aspart  0-9 Units Subcutaneous TID WC  . lidocaine  1 patch Transdermal Q24H  . mouth rinse  15 mL Mouth Rinse q12n4p  . methocarbamol  500 mg Oral BID  . polyethylene glycol  17 g Oral BID  . potassium chloride  20 mEq Oral Daily  . QUEtiapine  75 mg Oral  QHS  . sacubitril-valsartan  1 tablet Oral BID  . senna-docusate  1 tablet Oral BID  . sildenafil  20 mg Oral TID  . traZODone  25  mg Oral QHS   Continuous Infusions:     LOS: 5 days   Marinda Elk  [facility]  10/18/2020, 8:55 AM

## 2020-10-18 NOTE — Progress Notes (Signed)
Rt at bedside to place pt on CPAP.  RN informed RT that pt is going to be having lots of test due to his abdominal pain, to hold off on CPAP for now.

## 2020-10-18 NOTE — H&P (Deleted)
HOSPITAL MEDICINE OVERNIGHT EVENT NOTE    Notified by nursing the patient has suddenly begun to complain of severe, 10 out of 10 abdominal pain.   Abdominal pain seems to be located in the periumbilical region and left lower quadrant.  Nursing reports that abdomen is diffusely tender and firm to the touch.  These complaints and findings are vague yet concerning and therefore I have ordered a stat abdominal x-ray, lipase, hepatic function panel, urinalysis, lactic acid, troponin and EKG.  We will additionally order 2 mg of IV morphine for associated pain.  We will follow-up on these labs and monitor patient closely.   Marvel Mcphillips J Azael Ragain  MD [facility]  

## 2020-10-18 NOTE — TOC Progression Note (Signed)
Transition of Care [facility]) - Progression Note    Patient Details  Name: RYLEI CODISPOTI MRN: [id number] Date of Birth: 1941-04-08  Transition of Care [facility]) CM/SW Contact  Ivette Loyal, [place] Phone Number: 10/18/2020, 1:59 PM  Clinical Narrative:    1235: CSW spoke with French Ana at [employer] who wanted to confirm pt will go to [place] and not back to [facility]. CSW contacted pt daughter with no answer, left VM. CSW will follow up.   Expected Discharge Plan: Skilled Nursing Facility Barriers to Discharge: Continued Medical Work up  Expected Discharge Plan and Services Expected Discharge Plan: Skilled Nursing Facility       Living arrangements for the past 2 months: Skilled Nursing Facility                                       Social Determinants of Health (SDOH) Interventions    Readmission Risk Interventions Readmission Risk Prevention Plan 06/08/2020  Transportation Screening Complete  Medication Review Oceanographer) Complete  HRI or Home Care Consult Complete  SW Recovery Care/Counseling Consult Complete  Palliative Care Screening Not Applicable  Skilled Nursing Facility Not Applicable  Some recent data might be hidden

## 2020-10-19 ENCOUNTER — Inpatient Hospital Stay (HOSPITAL_COMMUNITY): Payer: Medicare Other

## 2020-10-19 DIAGNOSIS — R1030 Lower abdominal pain, unspecified: Secondary | ICD-10-CM

## 2020-10-19 LAB — GLUCOSE, CAPILLARY
Glucose-Capillary: 112 mg/dL — ABNORMAL HIGH (ref 70–99)
Glucose-Capillary: 157 mg/dL — ABNORMAL HIGH (ref 70–99)
Glucose-Capillary: 150 mg/dL — ABNORMAL HIGH (ref 70–99)
Glucose-Capillary: 141 mg/dL — ABNORMAL HIGH (ref 70–99)

## 2020-10-19 LAB — SARS CORONAVIRUS 2 (TAT 6-24 HRS): SARS Coronavirus 2: NEGATIVE

## 2020-10-19 MED ORDER — SODIUM CHLORIDE 0.9 % IV SOLN
1.0000 g | Freq: Every day | INTRAVENOUS | Status: DC
  Administered 2020-10-19 – 2020-10-21 (×3): 1 g via INTRAVENOUS
  Filled 2020-10-19 (×3): qty 10

## 2020-10-19 MED ORDER — IOHEXOL 350 MG/ML SOLN
75.0000 mL | Freq: Once | INTRAVENOUS | Status: AC | PRN
  Administered 2020-10-19: 75 mL via INTRAVENOUS

## 2020-10-19 NOTE — Progress Notes (Signed)
Around 2130 pt complained of a stomach ache. Zofran was given with no relief. Around 2200 developed severe abdominal pain throughout abdomen but more localized to the LLQ. Pt rated 10/10 pain. Abdomen firm on palpation. MD notified and placed orders. Labs drawn, Abd xray, UA and morphine administered with slight relief. Bladder scan showed no urine in bladder. MD order an abd CT. Pt having less pain but states pain still present. VSS throughout shift.

## 2020-10-19 NOTE — TOC Progression Note (Signed)
Transition of Care [facility]) - Progression Note    Patient Details  Name: Isaiah [name] MRN: [id number] Date of Birth: 1940-12-19  Transition of Care [facility]) CM/SW Contact  Eduard Roux, [place] Phone Number: 10/19/2020, 12:45 PM  Clinical Narrative:     CSW spoke with patient's daughter- she confirmed she wants the patient to return to [facility] in [place]-  CSW spoke with [facility] - informed the patient will return to [facility] facility- and the patient will be ready for d/c tomorrow. covid test requested   Antony Blackbird, MSW, LCSW Clinical Social Worker   Expected Discharge Plan: Skilled Nursing Facility Barriers to Discharge: Continued Medical Work up  Expected Discharge Plan and Services Expected Discharge Plan: Skilled Nursing Facility       Living arrangements for the past 2 months: Skilled Nursing Facility                                       Social Determinants of Health (SDOH) Interventions    Readmission Risk Interventions Readmission Risk Prevention Plan 06/08/2020  Transportation Screening Complete  Medication Review Oceanographer) Complete  HRI or Home Care Consult Complete  SW Recovery Care/Counseling Consult Complete  Palliative Care Screening Not Applicable  Skilled Nursing Facility Not Applicable  Some recent data might be hidden

## 2020-10-19 NOTE — Progress Notes (Signed)
[facility]  PROGRESS NOTE  CORYDON SCHWEISS PRF:[id number] DOB: 1940/05/28 DOA: 10/13/2020 PCP: [place], [name], MD   Brief HPI:   80 year old male with past medical history of chronic diastolic heart failure, atrial fibrillation not on anticoagulation due to intracranial hemorrhage, essential hypertension, diabetes mellitus type 2, CKD stage IIIb, fracture L1, OSA who has been at skilled nursing facility for past several weeks and started feeling short of breath.  Chest x-ray showed pulmonary edema, BNP 245.    Subjective   Patient seen and examined, complains of abdominal pain last night.  Abdomen x-ray only showed stool in the ascending and transverse colon.  No other significant abnormality.  No SBO.  Has been complaining of suprapubic pain patient has chronic indwelling Foley catheter.  UA obtained yesterday showed more than 50 WBCs per high-power field   Assessment/Plan:     Acute on chronic heart failure with preserved ejection fraction -EF 50 to 55% -Presented with shortness of breath; improved with diuresis -Has been getting Lasix 60 mg IV every 12 hours; net -9.6 L -Now has soft BP, will discontinue IV Lasix and start p.o. torsemide 40 mg p.o. 3 times daily from tomorrow morning -Continue Entresto   Chronic atrial fibrillation -Heart rate controlled -Not a candidate for anticoagulation due to history of intracranial bleed   Abdominal pain -Patient complained of abdominal pain last night, he was abnormal showed more than 50 WBC per high-power field -We will obtain urine culture -Continue IV ceftriaxone -Abdomen x-ray only shows stool in ascending and transverse colon, no SBO   Hypertension -Blood pressure is soft today, Lasix held as above -Amlodipine has been discontinued   CKD stage III -Creatinine at baseline   Diabetes mellitus type 2 -CBG well controlled -Continue sliding scale insulin with NovoLog   Scheduled medications:   . chlorhexidine  15  mL Mouth Rinse BID  . Chlorhexidine Gluconate Cloth  6 each Topical Daily  . empagliflozin  10 mg Oral Daily  . ferrous sulfate  325 mg Oral BID WC  . gabapentin  300 mg Oral TID  . heparin  5,000 Units Subcutaneous Q8H  . insulin aspart  0-5 Units Subcutaneous QHS  . insulin aspart  0-9 Units Subcutaneous TID WC  . lidocaine  1 patch Transdermal Q24H  . mouth rinse  15 mL Mouth Rinse q12n4p  . methocarbamol  500 mg Oral BID  . polyethylene glycol  17 g Oral BID  . potassium chloride  20 mEq Oral Daily  . QUEtiapine  75 mg Oral QHS  . sacubitril-valsartan  1 tablet Oral BID  . senna-docusate  1 tablet Oral BID  . sildenafil  20 mg Oral TID  . traZODone  25 mg Oral QHS         Data Reviewed:   CBG:  Recent Labs  Lab 10/18/20 1112 10/18/20 1514 10/18/20 2137 10/19/20 0603 10/19/20 1142  GLUCAP 179* 156* 109* 112* 157*    SpO2: 100 % O2 Flow Rate (L/min): 2 L/min    Vitals:   10/18/20 2048 10/19/20 0437 10/19/20 1130 10/19/20 1145  BP: (!) 148/53 (!) 109/42 (!) 107/38 (!) 107/38  Pulse: (!) 42 (!) 46 (!) 43 (!) 43  Resp: 18 16 16 16   Temp: 98 F (36.7 C) 97.9 F (36.6 C) (!) 97.4 F (36.3 C) (!) 97.4 F (36.3 C)  TempSrc: Oral Oral  Oral  SpO2: 97% 100%  100%  Weight:  83.6 kg       Intake/Output  Summary (Last 24 hours) at 10/19/2020 1654 Last data filed at 10/19/2020 1306 Gross per 24 hour  Intake 700 ml  Output 1600 ml  Net -900 ml    06/06 1901 - 06/08 0700 In: 960 [P.O.:960] Out: 4025 [Urine:4025]  Filed Weights   10/17/20 0230 10/18/20 0424 10/19/20 0437  Weight: 86.8 kg 86.9 kg 83.6 kg    CBC:  Recent Labs  Lab 10/13/20 1255 10/18/20 2215  WBC 8.1 9.6  HGB 8.5* 9.7*  HCT 29.7* 32.8*  PLT 228 288  MCV 102.1* 101.2*  MCH 29.2 29.9  MCHC 28.6* 29.6*  RDW 16.6* 16.4*  LYMPHSABS 1.7 2.0  MONOABS 0.6 0.7  EOSABS 0.2 0.3  BASOSABS 0.1 0.1    Complete metabolic panel:  Recent Labs  Lab 10/13/20 1255 10/13/20 1515  10/13/20 1515 10/14/20 0048 10/15/20 0342 10/16/20 0403 10/17/20 0909 10/18/20 0423 10/18/20 2215  NA  --  136   < > 138 137 139 141 138 138  K  --  4.9   < > 4.7 4.8 4.9 4.3 4.3 4.9  CL  --  91*   < > 93* 90* 94* 95* 95* 92*  CO2  --  36*   < > 38* 38* 36* 39* 38* 40*  GLUCOSE  --  114*   < > 118* 114* 119* 109* 107* 120*  BUN  --  56*   < > 57* 51* 48* 43* 40* 39*  CREATININE  --  1.54*   < > 1.51* 1.49* 1.50* 1.35* 1.29* 1.32*  CALCIUM  --  8.9   < > 8.7* 8.5* 8.6* 8.9 8.6* 8.9  AST  --  19  --   --   --   --   --   --  17  ALT  --  14  --   --   --   --   --   --  10  ALKPHOS  --  93  --   --   --   --   --   --  97  BILITOT  --  0.5  --   --   --   --   --   --  0.3  ALBUMIN  --  2.9*  --   --   --   --   --   --  2.8*  LATICACIDVEN  --   --   --   --   --   --   --   --  1.1  HGBA1C  --   --   --  5.7*  --   --   --   --   --   BNP 245.0*  --   --   --   --   --   --   --   --    < > = values in this interval not displayed.    Recent Labs  Lab 10/18/20 2215  LIPASE 44    Recent Labs  Lab 10/13/20 1255 10/13/20 2004  BNP 245.0*  --   SARSCOV2NAA  --  NEGATIVE    ------------------------------------------------------------------------------------------------------------------ No results for input(s): CHOL, HDL, LDLCALC, TRIG, CHOLHDL, LDLDIRECT in the last 72 hours.  Lab Results  Component Value Date   HGBA1C 5.7 (H) 10/14/2020   ------------------------------------------------------------------------------------------------------------------ No results for input(s): TSH, T4TOTAL, T3FREE, THYROIDAB in the last 72 hours.  Invalid input(s): FREET3 ------------------------------------------------------------------------------------------------------------------ No results for input(s): VITAMINB12, FOLATE, FERRITIN, TIBC, IRON, RETICCTPCT in the last 72  hours.  Coagulation profile No results for input(s): INR, PROTIME in the last 168 hours. No results for  input(s): DDIMER in the last 72 hours.  Cardiac Enzymes No results for input(s): CKTOTAL, CKMB, CKMBINDEX, TROPONINI in the last 168 hours.  ------------------------------------------------------------------------------------------------------------------    Component Value Date/Time   BNP 245.0 (H) 10/13/2020 1255     Antibiotics: Anti-infectives (From admission, onward)   Start     Dose/Rate Route Frequency Ordered Stop   10/19/20 0315  cefTRIAXone (ROCEPHIN) 1 g in sodium chloride 0.9 % 100 mL IVPB        1 g 200 mL/hr over 30 Minutes Intravenous Daily at bedtime 10/19/20 0256         Radiology Reports  DG Abd Portable 2V  Result Date: 10/18/2020 CLINICAL DATA:  Abdominal pain. EXAM: PORTABLE ABDOMEN - 2 VIEW COMPARISON:  CT 07/13/2020 FINDINGS: Divided supine and upright views of the abdomen. There is no evidence of free intra-abdominal air. Mild gaseous gastric distension. No small bowel dilatation or evidence of obstruction. Small to moderate formed stool in the ascending and transverse colon with only minimal stool distally. Advanced vascular calcifications. No radiopaque calculi. IMPRESSION: Mild gaseous gastric distension, can be seen with gastroparesis. No evidence of bowel obstruction or free air. Electronically Signed   By: Narda RutherfordMelanie  Sanford M.D.   On: 10/18/2020 22:50   CT Angio Abd/Pel w/ and/or w/o  Result Date: 10/19/2020 CLINICAL DATA:  Mesenteric ischemia, acute Abdominal pain, acute, nonlocalized EXAM: CTA ABDOMEN AND PELVIS WITHOUT AND WITH CONTRAST TECHNIQUE: Multidetector CT imaging of the abdomen and pelvis was performed using the standard protocol during bolus administration of intravenous contrast. Multiplanar reconstructed images and MIPs were obtained and reviewed to evaluate the vascular anatomy. CONTRAST:  75mL OMNIPAQUE IOHEXOL 350 MG/ML SOLN COMPARISON:  CT abdomen pelvis 03/14/2020, CT lumbar spine 12/12/2018, CT chest 04/02/2020 FINDINGS: VASCULAR No  extravasation of intravenous contrast. Aorta: Severe atherosclerotic plaque. Normal caliber aorta without aneurysm, dissection, vasculitis or significant stenosis. Celiac: Severe atherosclerotic plaque. Patent without evidence of aneurysm, dissection, vasculitis or significant stenosis. SMA: Severe atherosclerotic plaque. Patent without evidence of aneurysm, dissection, vasculitis or significant stenosis. Renals: Severe atherosclerotic plaque. Both renal arteries are patent without evidence of aneurysm, dissection, vasculitis, fibromuscular dysplasia or significant stenosis. IMA: Severe atherosclerotic plaque. Patent without evidence of aneurysm, dissection, vasculitis or significant stenosis. Inflow: Severe atherosclerotic plaque. Patent without evidence of aneurysm, dissection, vasculitis or significant stenosis. Proximal Outflow: Severe atherosclerotic plaque. Surgical changes along bilateral inguinal regions likely related to prior femoral access with no definite pseudoaneurysm noted. Bilateral common femoral and visualized portions of the superficial and profunda femoral arteries are patent without evidence of aneurysm, dissection, vasculitis or significant stenosis. Veins: The main portal, splenic, mesenteric veins are patent. Review of the MIP images confirms the above findings. NON-VASCULAR Lower chest: Coronary artery calcifications. Status post coronary artery bypass and coronary artery stents.Bilateral small, right greater than left, pleural effusions. Bilateral lower lobe almost complete collapse, left greater than right. Linear atelectasis within the lingula. Hepatobiliary: Cirrhotic morphology of the liver. No focal lesion. No biliary ductal dilatation. Hydropic gallbladder. Question fundal gallbladder wall thickening (10: 55-65). No bowel thickening or pericholecystic fluid. No CT evidence of calcified gallstones. Pancreas: No focal lesion. Normal pancreatic contour. No surrounding inflammatory  changes. No main pancreatic ductal dilatation. Spleen: Normal in size without focal abnormality. Adrenals/Urinary Tract: No adrenal nodule bilaterally. Bilateral kidneys enhance symmetrically. Fluid density lesions likely represent simple renal cysts. Largest of these measures up to  1.5 cm on the left and 2.3 cm on the right. Subcentimeter hypodensities are too small to characterize. No hydronephrosis. No hydroureter. Urinary bladder wall thickening with pervesicular fat stranding. The urinary bladder is decompressed with Foley catheter terminating within its lumen. Stomach/Bowel: Stomach is within normal limits. No evidence of bowel wall thickening or dilatation. Scattered colonic diverticulosis with no acute diverticulitis. Stool throughout the colon. No pneumatosis. The appendix is not definitely identified. Lymphatic: No lymphadenopathy. Reproductive: Prostate is unremarkable. Other: No intraperitoneal free fluid. No intraperitoneal free gas. No organized fluid collection. Musculoskeletal: Lobulated umbilical fat containing hernia. Likely subacute left displaced 8th rib fracture. No suspicious lytic or blastic osseous lesions. No acute displaced fracture. Multilevel degenerative changes of the spine. Partially visualized thoracolumbar surgical hardware posterolateral fusion with interval development of lucency surrounding the L1 vertebral body screws associated with anterior wall/superior endplate vertebral body erosion IMPRESSION: VASCULAR 1. No acute vascular abnormality. 2. Aortic Atherosclerosis (ICD10-I70.0) - severe. NON-VASCULAR 1. Bilateral small, right greater than left, pleural effusions. 2. Urinary bladder wall thickening with perivesicular fat stranding. Collapse with Foley catheter in appropriate position. Correlate with urinalysis for infection. 3. Nonspecific hydropic gallbladder with question of fundal gallbladder wall thickening. Consider right upper quadrant ultrasound for further evaluation.  4. Cirrhosis with no CT findings of portal hypertension. 5. Scattered colonic diverticulosis with no acute diverticulitis. 6. Partially visualized thoracolumbar surgical hardware posterolateral fusion with interval development of lucency surrounding the L1 vertebral body screws associated with anterior wall/superior endplate vertebral body erosion 7. Likely subacute left displaced 8th rib fracture. Electronically Signed   By: Tish Frederickson M.D.   On: 10/19/2020 05:04      DVT prophylaxis: Heparin  Code Status: Full code  Family Communication: Discussed with patient's son at bedside   Consultants:    Procedures:      Objective    Physical Examination:    General-appears in no acute distress  Heart-S1-S2, regular, no murmur auscultated  Lungs-clear to auscultation bilaterally, no wheezing or crackles auscultated  Abdomen-soft, mild periumbilical tenderness to palpation, no rigidity or guarding  Extremities-no edema in the lower extremities  Neuro-alert, oriented x3, no focal deficit noted   Status is: Inpatient  Dispo: The patient is from: Skilled nursing facility              Anticipated d/c is to: Skilled nursing facility              Anticipated d/c date is: 10/21/2020              Patient currently not stable for discharge  Barrier to discharge-ongoing treatment for acute on chronic systolic heart failure  COVID-19 Labs  No results for input(s): DDIMER, FERRITIN, LDH, CRP in the last 72 hours.  Lab Results  Component Value Date   SARSCOV2NAA NEGATIVE 10/13/2020   SARSCOV2NAA NEGATIVE 05/20/2020   SARSCOV2NAA NEGATIVE 03/14/2020   SARSCOV2NAA NEGATIVE 12/13/2018    Microbiology  Recent Results (from the past 240 hour(s))  SARS CORONAVIRUS 2 (TAT 6-24 HRS) Nasopharyngeal Nasopharyngeal Swab     Status: None   Collection Time: 10/13/20  8:04 PM   Specimen: Nasopharyngeal Swab  Result Value Ref Range Status   SARS Coronavirus 2 NEGATIVE NEGATIVE  Final    Comment: (NOTE) SARS-CoV-2 target nucleic acids are NOT DETECTED.  The SARS-CoV-2 RNA is generally detectable in upper and lower respiratory specimens during the acute phase of infection. Negative results do not preclude SARS-CoV-2 infection, do not rule out co-infections with other pathogens,  and should not be used as the sole basis for treatment or other patient management decisions. Negative results must be combined with clinical observations, patient history, and epidemiological information. The expected result is Negative.  Fact Sheet for Patients: HairSlick.no  Fact Sheet for Healthcare Providers: quierodirigir.com  This test is not yet approved or cleared by the [place] FDA and  has been authorized for detection and/or diagnosis of SARS-CoV-2 by FDA under an Emergency Use Authorization (EUA). This EUA will remain  in effect (meaning this test can be used) for the duration of the COVID-19 declaration under Se ction 564(b)(1) of the Act, 21 U.S.C. section 360bbb-3(b)(1), unless the authorization is terminated or revoked sooner.  Performed at [facility], [address], [place], [place] [address]     Pressure Injury 03/14/20 Buttocks Left Stage 2 -  Partial thickness loss of dermis presenting as a shallow open injury with a red, pink wound bed without slough. (Active)  03/14/20 2300  Location: Buttocks  Location Orientation: Left  Staging: Stage 2 -  Partial thickness loss of dermis presenting as a shallow open injury with a red, pink wound bed without slough.  Wound Description (Comments):   Present on Admission: Yes     Pressure Injury 03/28/20 Ear Right Stage 2 -  Partial thickness loss of dermis presenting as a shallow open injury with a red, pink wound bed without slough. (Active)  03/28/20 1100  Location: Ear  Location Orientation: Right  Staging: Stage 2 -  Partial thickness loss of  dermis presenting as a shallow open injury with a red, pink wound bed without slough.  Wound Description (Comments):   Present on Admission: No          Charletha Dalpe S Gwendoline Judy   [facility] If 7PM-7AM, please contact night-coverage at [email or web address], Office  [phone]   10/19/2020, 4:54 PM  LOS: 6 days

## 2020-10-20 LAB — BASIC METABOLIC PANEL
Anion gap: 7 (ref 5–15)
BUN: 41 mg/dL — ABNORMAL HIGH (ref 8–23)
CO2: 36 mmol/L — ABNORMAL HIGH (ref 22–32)
Calcium: 8.8 mg/dL — ABNORMAL LOW (ref 8.9–10.3)
Chloride: 94 mmol/L — ABNORMAL LOW (ref 98–111)
Creatinine, Ser: 1.36 mg/dL — ABNORMAL HIGH (ref 0.61–1.24)
GFR, Estimated: 53 mL/min — ABNORMAL LOW (ref >60)
Glucose, Bld: 117 mg/dL — ABNORMAL HIGH (ref 70–99)
Potassium: 5.1 mmol/L (ref 3.5–5.1)
Sodium: 137 mmol/L (ref 135–145)

## 2020-10-20 LAB — GLUCOSE, CAPILLARY
Glucose-Capillary: 119 mg/dL — ABNORMAL HIGH (ref 70–99)
Glucose-Capillary: 153 mg/dL — ABNORMAL HIGH (ref 70–99)
Glucose-Capillary: 168 mg/dL — ABNORMAL HIGH (ref 70–99)
Glucose-Capillary: 159 mg/dL — ABNORMAL HIGH (ref 70–99)
Glucose-Capillary: 147 mg/dL — ABNORMAL HIGH (ref 70–99)

## 2020-10-20 LAB — CBC
HCT: 29.9 % — ABNORMAL LOW (ref 39.0–52.0)
Hemoglobin: 8.8 g/dL — ABNORMAL LOW (ref 13.0–17.0)
MCH: 30 pg (ref 26.0–34.0)
MCHC: 29.4 g/dL — ABNORMAL LOW (ref 30.0–36.0)
MCV: 102 fL — ABNORMAL HIGH (ref 80.0–100.0)
Platelets: 227 10*3/uL (ref 150–400)
RBC: 2.93 MIL/uL — ABNORMAL LOW (ref 4.22–5.81)
RDW: 16.3 % — ABNORMAL HIGH (ref 11.5–15.5)
WBC: 8 10*3/uL (ref 4.0–10.5)
nRBC: 0 % (ref 0.0–0.2)

## 2020-10-20 MED ORDER — TORSEMIDE 20 MG PO TABS
40.0000 mg | ORAL_TABLET | Freq: Two times a day (BID) | ORAL | Status: DC
  Administered 2020-10-20 – 2020-10-21 (×3): 40 mg via ORAL
  Filled 2020-10-20 (×3): qty 2

## 2020-10-20 NOTE — TOC Progression Note (Signed)
Transition of Care [facility]) - Progression Note    Patient Details  Name: Isaiah [name] MRN: [id number] Date of Birth: 08/17/1940  Transition of Care [facility]) CM/SW Contact  Eduard Roux, [place] Phone Number: 10/20/2020, 12:40 PM  Clinical Narrative:     CSW informed SNF -[facility]-to anticipate d/c tomorrow   Antony Blackbird, MSW, LCSW Clinical Social Worker    Expected Discharge Plan: Skilled Nursing Facility Barriers to Discharge: Continued Medical Work up  Expected Discharge Plan and Services Expected Discharge Plan: Skilled Nursing Facility       Living arrangements for the past 2 months: Skilled Nursing Facility                                       Social Determinants of Health (SDOH) Interventions    Readmission Risk Interventions Readmission Risk Prevention Plan 06/08/2020  Transportation Screening Complete  Medication Review Oceanographer) Complete  HRI or Home Care Consult Complete  SW Recovery Care/Counseling Consult Complete  Palliative Care Screening Not Applicable  Skilled Nursing Facility Not Applicable  Some recent data might be hidden

## 2020-10-20 NOTE — Care Management Important Message (Signed)
Important Message  Patient Details  Name: Isaiah [name] MRN: [id number] Date of Birth: Sep 11, 1940   Medicare Important Message Given:  Yes     [name] 10/20/2020, 12:00 PM

## 2020-10-20 NOTE — Progress Notes (Signed)
[facility]  PROGRESS NOTE  Isaiah [name] VXB:[id number] DOB: Sep 16, 1940 DOA: 10/13/2020 PCP: [place], [name], MD   Brief HPI:   80 year old male with past medical history of chronic diastolic heart failure, atrial fibrillation not on anticoagulation due to intracranial hemorrhage, essential hypertension, diabetes mellitus type 2, CKD stage IIIb, fracture L1, OSA who has been at skilled nursing facility for past several weeks and started feeling short of breath.  Chest x-ray showed pulmonary edema, BNP 245.    Subjective   Patient seen and examined, abdominal pain has resolved.  Was started on IV ceftriaxone for possible UTI.  Urine culture obtained, result is currently pending.   Assessment/Plan:     Acute on chronic heart failure with preserved ejection fraction -EF 50 to 55% -Presented with shortness of breath; improved with diuresis -Has been getting Lasix 60 mg IV every 12 hours; net -9.6 L -We will start torsemide 40 mg p.o. twice daily today.   -Continue Entresto   Chronic atrial fibrillation -Heart rate controlled -Not a candidate for anticoagulation due to history of intracranial bleed   Abdominal pain -Resolved -Patient complained of abdominal pain  yesterday, urinalysis was abnormal ; showed more than 50 WBC per high-power field -Urine culture has been obtained; result is currently pending -Continue IV ceftriaxone -Abdomen x-ray only shows stool in ascending and transverse colon, no SBO   Hypertension -Blood pressure is soft today, Lasix held as above -Amlodipine has been discontinued   CKD stage III -Creatinine at baseline   Diabetes mellitus type 2 -CBG well controlled -Continue sliding scale insulin with NovoLog   Scheduled medications:    chlorhexidine  15 mL Mouth Rinse BID   Chlorhexidine Gluconate Cloth  6 each Topical Daily   empagliflozin  10 mg Oral Daily   ferrous sulfate  325 mg Oral BID WC   gabapentin  300 mg Oral TID    heparin  5,000 Units Subcutaneous Q8H   insulin aspart  0-5 Units Subcutaneous QHS   insulin aspart  0-9 Units Subcutaneous TID WC   lidocaine  1 patch Transdermal Q24H   mouth rinse  15 mL Mouth Rinse q12n4p   methocarbamol  500 mg Oral BID   polyethylene glycol  17 g Oral BID   QUEtiapine  75 mg Oral QHS   sacubitril-valsartan  1 tablet Oral BID   senna-docusate  1 tablet Oral BID   sildenafil  20 mg Oral TID   traZODone  25 mg Oral QHS         Data Reviewed:   CBG:  Recent Labs  Lab 10/19/20 0603 10/19/20 1142 10/19/20 1721 10/19/20 2052 10/20/20 0606  GLUCAP 112* 157* 150* 141* 119*    SpO2: 100 % O2 Flow Rate (L/min): 2 L/min    Vitals:   10/19/20 2251 10/20/20 0411 10/20/20 0513 10/20/20 1152  BP:  (!) 119/43  (!) 142/47  Pulse: (!) 46 (!) 44  (!) 46  Resp: 16 16    Temp:  97.8 F (36.6 C)  97.6 F (36.4 C)  TempSrc:  Oral  Oral  SpO2: 96% 93%  100%  Weight:   84.4 kg      Intake/Output Summary (Last 24 hours) at 10/20/2020 1159 Last data filed at 10/20/2020 1018 Gross per 24 hour  Intake 1020 ml  Output 1175 ml  Net -155 ml    06/07 1901 - 06/09 0700 In: 880 [P.O.:780] Out: 2325 [Urine:2325]  Filed Weights   10/18/20 0424 10/19/20 0437 10/20/20  1610  Weight: 86.9 kg 83.6 kg 84.4 kg    CBC:  Recent Labs  Lab 10/13/20 1255 10/18/20 2215 10/20/20 0259  WBC 8.1 9.6 8.0  HGB 8.5* 9.7* 8.8*  HCT 29.7* 32.8* 29.9*  PLT 228 288 227  MCV 102.1* 101.2* 102.0*  MCH 29.2 29.9 30.0  MCHC 28.6* 29.6* 29.4*  RDW 16.6* 16.4* 16.3*  LYMPHSABS 1.7 2.0  --   MONOABS 0.6 0.7  --   EOSABS 0.2 0.3  --   BASOSABS 0.1 0.1  --     Complete metabolic panel:  Recent Labs  Lab 10/13/20 1255 10/13/20 1515 10/13/20 1515 10/14/20 0048 10/15/20 0342 10/16/20 0403 10/17/20 0909 10/18/20 0423 10/18/20 2215 10/20/20 0259  NA  --  136   < > 138   < > 139 141 138 138 137  K  --  4.9   < > 4.7   < > 4.9 4.3 4.3 4.9 5.1  CL  --  91*   < > 93*   < >  94* 95* 95* 92* 94*  CO2  --  36*   < > 38*   < > 36* 39* 38* 40* 36*  GLUCOSE  --  114*   < > 118*   < > 119* 109* 107* 120* 117*  BUN  --  56*   < > 57*   < > 48* 43* 40* 39* 41*  CREATININE  --  1.54*   < > 1.51*   < > 1.50* 1.35* 1.29* 1.32* 1.36*  CALCIUM  --  8.9   < > 8.7*   < > 8.6* 8.9 8.6* 8.9 8.8*  AST  --  19  --   --   --   --   --   --  17  --   ALT  --  14  --   --   --   --   --   --  10  --   ALKPHOS  --  93  --   --   --   --   --   --  97  --   BILITOT  --  0.5  --   --   --   --   --   --  0.3  --   ALBUMIN  --  2.9*  --   --   --   --   --   --  2.8*  --   LATICACIDVEN  --   --   --   --   --   --   --   --  1.1  --   HGBA1C  --   --   --  5.7*  --   --   --   --   --   --   BNP 245.0*  --   --   --   --   --   --   --   --   --    < > = values in this interval not displayed.    Recent Labs  Lab 10/18/20 2215  LIPASE 44    Recent Labs  Lab 10/13/20 1255 10/13/20 2004 10/19/20 1248  BNP 245.0*  --   --   SARSCOV2NAA  --  NEGATIVE NEGATIVE    ------------------------------------------------------------------------------------------------------------------ No results for input(s): CHOL, HDL, LDLCALC, TRIG, CHOLHDL, LDLDIRECT in the last 72 hours.  Lab Results  Component Value Date   HGBA1C 5.7 (H) 10/14/2020   ------------------------------------------------------------------------------------------------------------------  No results for input(s): TSH, T4TOTAL, T3FREE, THYROIDAB in the last 72 hours.  Invalid input(s): FREET3 ------------------------------------------------------------------------------------------------------------------ No results for input(s): VITAMINB12, FOLATE, FERRITIN, TIBC, IRON, RETICCTPCT in the last 72 hours.  Coagulation profile No results for input(s): INR, PROTIME in the last 168 hours. No results for input(s): DDIMER in the last 72 hours.  Cardiac Enzymes No results for input(s): CKTOTAL, CKMB, CKMBINDEX, TROPONINI in  the last 168 hours.  ------------------------------------------------------------------------------------------------------------------    Component Value Date/Time   BNP 245.0 (H) 10/13/2020 1255     Antibiotics: Anti-infectives (From admission, onward)    Start     Dose/Rate Route Frequency Ordered Stop   10/19/20 0315  cefTRIAXone (ROCEPHIN) 1 g in sodium chloride 0.9 % 100 mL IVPB        1 g 200 mL/hr over 30 Minutes Intravenous Daily at bedtime 10/19/20 0256          Radiology Reports  DG Abd Portable 2V  Result Date: 10/18/2020 CLINICAL DATA:  Abdominal pain. EXAM: PORTABLE ABDOMEN - 2 VIEW COMPARISON:  CT 07/13/2020 FINDINGS: Divided supine and upright views of the abdomen. There is no evidence of free intra-abdominal air. Mild gaseous gastric distension. No small bowel dilatation or evidence of obstruction. Small to moderate formed stool in the ascending and transverse colon with only minimal stool distally. Advanced vascular calcifications. No radiopaque calculi. IMPRESSION: Mild gaseous gastric distension, can be seen with gastroparesis. No evidence of bowel obstruction or free air. Electronically Signed   By: Narda Rutherford M.D.   On: 10/18/2020 22:50   CT Angio Abd/Pel w/ and/or w/o  Result Date: 10/19/2020 CLINICAL DATA:  Mesenteric ischemia, acute Abdominal pain, acute, nonlocalized EXAM: CTA ABDOMEN AND PELVIS WITHOUT AND WITH CONTRAST TECHNIQUE: Multidetector CT imaging of the abdomen and pelvis was performed using the standard protocol during bolus administration of intravenous contrast. Multiplanar reconstructed images and MIPs were obtained and reviewed to evaluate the vascular anatomy. CONTRAST:  75mL OMNIPAQUE IOHEXOL 350 MG/ML SOLN COMPARISON:  CT abdomen pelvis 03/14/2020, CT lumbar spine 12/12/2018, CT chest 04/02/2020 FINDINGS: VASCULAR No extravasation of intravenous contrast. Aorta: Severe atherosclerotic plaque. Normal caliber aorta without aneurysm,  dissection, vasculitis or significant stenosis. Celiac: Severe atherosclerotic plaque. Patent without evidence of aneurysm, dissection, vasculitis or significant stenosis. SMA: Severe atherosclerotic plaque. Patent without evidence of aneurysm, dissection, vasculitis or significant stenosis. Renals: Severe atherosclerotic plaque. Both renal arteries are patent without evidence of aneurysm, dissection, vasculitis, fibromuscular dysplasia or significant stenosis. IMA: Severe atherosclerotic plaque. Patent without evidence of aneurysm, dissection, vasculitis or significant stenosis. Inflow: Severe atherosclerotic plaque. Patent without evidence of aneurysm, dissection, vasculitis or significant stenosis. Proximal Outflow: Severe atherosclerotic plaque. Surgical changes along bilateral inguinal regions likely related to prior femoral access with no definite pseudoaneurysm noted. Bilateral common femoral and visualized portions of the superficial and profunda femoral arteries are patent without evidence of aneurysm, dissection, vasculitis or significant stenosis. Veins: The main portal, splenic, mesenteric veins are patent. Review of the MIP images confirms the above findings. NON-VASCULAR Lower chest: Coronary artery calcifications. Status post coronary artery bypass and coronary artery stents.Bilateral small, right greater than left, pleural effusions. Bilateral lower lobe almost complete collapse, left greater than right. Linear atelectasis within the lingula. Hepatobiliary: Cirrhotic morphology of the liver. No focal lesion. No biliary ductal dilatation. Hydropic gallbladder. Question fundal gallbladder wall thickening (10: 55-65). No bowel thickening or pericholecystic fluid. No CT evidence of calcified gallstones. Pancreas: No focal lesion. Normal pancreatic contour. No surrounding inflammatory changes. No main pancreatic  ductal dilatation. Spleen: Normal in size without focal abnormality. Adrenals/Urinary Tract:  No adrenal nodule bilaterally. Bilateral kidneys enhance symmetrically. Fluid density lesions likely represent simple renal cysts. Largest of these measures up to 1.5 cm on the left and 2.3 cm on the right. Subcentimeter hypodensities are too small to characterize. No hydronephrosis. No hydroureter. Urinary bladder wall thickening with pervesicular fat stranding. The urinary bladder is decompressed with Foley catheter terminating within its lumen. Stomach/Bowel: Stomach is within normal limits. No evidence of bowel wall thickening or dilatation. Scattered colonic diverticulosis with no acute diverticulitis. Stool throughout the colon. No pneumatosis. The appendix is not definitely identified. Lymphatic: No lymphadenopathy. Reproductive: Prostate is unremarkable. Other: No intraperitoneal free fluid. No intraperitoneal free gas. No organized fluid collection. Musculoskeletal: Lobulated umbilical fat containing hernia. Likely subacute left displaced 8th rib fracture. No suspicious lytic or blastic osseous lesions. No acute displaced fracture. Multilevel degenerative changes of the spine. Partially visualized thoracolumbar surgical hardware posterolateral fusion with interval development of lucency surrounding the L1 vertebral body screws associated with anterior wall/superior endplate vertebral body erosion IMPRESSION: VASCULAR 1. No acute vascular abnormality. 2. Aortic Atherosclerosis (ICD10-I70.0) - severe. NON-VASCULAR 1. Bilateral small, right greater than left, pleural effusions. 2. Urinary bladder wall thickening with perivesicular fat stranding. Collapse with Foley catheter in appropriate position. Correlate with urinalysis for infection. 3. Nonspecific hydropic gallbladder with question of fundal gallbladder wall thickening. Consider right upper quadrant ultrasound for further evaluation. 4. Cirrhosis with no CT findings of portal hypertension. 5. Scattered colonic diverticulosis with no acute diverticulitis.  6. Partially visualized thoracolumbar surgical hardware posterolateral fusion with interval development of lucency surrounding the L1 vertebral body screws associated with anterior wall/superior endplate vertebral body erosion 7. Likely subacute left displaced 8th rib fracture. Electronically Signed   By: Tish FredericksonMorgane  Naveau M.D.   On: 10/19/2020 05:04       DVT prophylaxis: Heparin  Code Status: Full code  Family Communication: Discussed with patient's son at bedside   Consultants:   Procedures:     Objective    Physical Examination:  General-appears in no acute distress Heart-S1-S2, regular, no murmur auscultated Lungs-clear to auscultation bilaterally, no wheezing or crackles auscultated Abdomen-soft, nontender, no organomegaly Extremities-no edema in the lower extremities Neuro-alert, oriented x3, no focal deficit noted   Status is: Inpatient  Dispo: The patient is from: Skilled nursing facility              Anticipated d/c is to: Skilled nursing facility              Anticipated d/c date is: 10/21/2020              Patient currently not stable for discharge  Barrier to discharge-ongoing treatment for acute on chronic systolic heart failure  COVID-19 Labs  No results for input(s): DDIMER, FERRITIN, LDH, CRP in the last 72 hours.  Lab Results  Component Value Date   SARSCOV2NAA NEGATIVE 10/19/2020   SARSCOV2NAA NEGATIVE 10/13/2020   SARSCOV2NAA NEGATIVE 05/20/2020   SARSCOV2NAA NEGATIVE 03/14/2020    Microbiology  Recent Results (from the past 240 hour(s))  SARS CORONAVIRUS 2 (TAT 6-24 HRS) Nasopharyngeal Nasopharyngeal Swab     Status: None   Collection Time: 10/13/20  8:04 PM   Specimen: Nasopharyngeal Swab  Result Value Ref Range Status   SARS Coronavirus 2 NEGATIVE NEGATIVE Final    Comment: (NOTE) SARS-CoV-2 target nucleic acids are NOT DETECTED.  The SARS-CoV-2 RNA is generally detectable in upper and lower respiratory specimens during the  acute  phase of infection. Negative results do not preclude SARS-CoV-2 infection, do not rule out co-infections with other pathogens, and should not be used as the sole basis for treatment or other patient management decisions. Negative results must be combined with clinical observations, patient history, and epidemiological information. The expected result is Negative.  Fact Sheet for Patients: HairSlick.no  Fact Sheet for Healthcare Providers: quierodirigir.com  This test is not yet approved or cleared by the [place] FDA and  has been authorized for detection and/or diagnosis of SARS-CoV-2 by FDA under an Emergency Use Authorization (EUA). This EUA will remain  in effect (meaning this test can be used) for the duration of the COVID-19 declaration under Se ction 564(b)(1) of the Act, 21 U.S.C. section 360bbb-3(b)(1), unless the authorization is terminated or revoked sooner.  Performed at [facility], [address] [place], [place] [address]   Culture, Urine     Status: None (Preliminary result)   Collection Time: 10/19/20 11:55 AM   Specimen: Urine, Random  Result Value Ref Range Status   Specimen Description URINE, RANDOM  Final   Special Requests NONE  Final   Culture   Final    CULTURE REINCUBATED FOR BETTER GROWTH Performed at [facility], [address] [place], [place] [address]    Report Status PENDING  Incomplete  SARS CORONAVIRUS 2 (TAT 6-24 HRS) Nasopharyngeal Nasopharyngeal Swab     Status: None   Collection Time: 10/19/20 12:48 PM   Specimen: Nasopharyngeal Swab  Result Value Ref Range Status   SARS Coronavirus 2 NEGATIVE NEGATIVE Final    Comment: (NOTE) SARS-CoV-2 target nucleic acids are NOT DETECTED.  The SARS-CoV-2 RNA is generally detectable in upper and lower respiratory specimens during the acute phase of infection. Negative results do not preclude SARS-CoV-2 infection, do not rule  out co-infections with other pathogens, and should not be used as the sole basis for treatment or other patient management decisions. Negative results must be combined with clinical observations, patient history, and epidemiological information. The expected result is Negative.  Fact Sheet for Patients: HairSlick.no  Fact Sheet for Healthcare Providers: quierodirigir.com  This test is not yet approved or cleared by the [place] FDA and  has been authorized for detection and/or diagnosis of SARS-CoV-2 by FDA under an Emergency Use Authorization (EUA). This EUA will remain  in effect (meaning this test can be used) for the duration of the COVID-19 declaration under Se ction 564(b)(1) of the Act, 21 U.S.C. section 360bbb-3(b)(1), unless the authorization is terminated or revoked sooner.  Performed at [facility], [address], [place], [place] [address]     Pressure Injury 03/14/20 Buttocks Left Stage 2 -  Partial thickness loss of dermis presenting as a shallow open injury with a red, pink wound bed without slough. (Active)  03/14/20 2300  Location: Buttocks  Location Orientation: Left  Staging: Stage 2 -  Partial thickness loss of dermis presenting as a shallow open injury with a red, pink wound bed without slough.  Wound Description (Comments):   Present on Admission: Yes     Pressure Injury 03/28/20 Ear Right Stage 2 -  Partial thickness loss of dermis presenting as a shallow open injury with a red, pink wound bed without slough. (Active)  03/28/20 1100  Location: Ear  Location Orientation: Right  Staging: Stage 2 -  Partial thickness loss of dermis presenting as a shallow open injury with a red, pink wound bed without  slough.  Wound Description (Comments):   Present on Admission: No          Miriam Kestler S Roe Wilner   [facility] If 7PM-7AM, please contact night-coverage at [email or web address], Office   [phone]   10/20/2020, 11:59 AM  LOS: 7 days

## 2020-10-21 DIAGNOSIS — N390 Urinary tract infection, site not specified: Secondary | ICD-10-CM

## 2020-10-21 LAB — GLUCOSE, CAPILLARY
Glucose-Capillary: 121 mg/dL — ABNORMAL HIGH (ref 70–99)
Glucose-Capillary: 150 mg/dL — ABNORMAL HIGH (ref 70–99)
Glucose-Capillary: 129 mg/dL — ABNORMAL HIGH (ref 70–99)

## 2020-10-21 LAB — RESP PANEL BY RT-PCR (FLU A&B, COVID) ARPGX2
Influenza A by PCR: NEGATIVE
Influenza B by PCR: NEGATIVE
SARS Coronavirus 2 by RT PCR: NEGATIVE

## 2020-10-21 LAB — BASIC METABOLIC PANEL
Anion gap: 8 (ref 5–15)
BUN: 44 mg/dL — ABNORMAL HIGH (ref 8–23)
CO2: 35 mmol/L — ABNORMAL HIGH (ref 22–32)
Calcium: 8.8 mg/dL — ABNORMAL LOW (ref 8.9–10.3)
Chloride: 93 mmol/L — ABNORMAL LOW (ref 98–111)
Creatinine, Ser: 1.5 mg/dL — ABNORMAL HIGH (ref 0.61–1.24)
GFR, Estimated: 47 mL/min — ABNORMAL LOW (ref >60)
Glucose, Bld: 112 mg/dL — ABNORMAL HIGH (ref 70–99)
Potassium: 4.8 mmol/L (ref 3.5–5.1)
Sodium: 136 mmol/L (ref 135–145)

## 2020-10-21 LAB — URINE CULTURE: Culture: 70000 — AB

## 2020-10-21 MED ORDER — AMOXICILLIN-POT CLAVULANATE 875-125 MG PO TABS: 1.0000 | ORAL_TABLET | Freq: Two times a day (BID) | ORAL | Status: DC

## 2020-10-21 MED ORDER — TORSEMIDE 40 MG PO TABS
40.0000 mg | ORAL_TABLET | Freq: Two times a day (BID) | ORAL | Status: AC
Start: 2020-10-21 — End: ?

## 2020-10-21 MED ORDER — DOCUSATE SODIUM 100 MG PO CAPS: 100.0000 mg | ORAL_CAPSULE | Freq: Two times a day (BID) | ORAL | 0 refills | Status: AC

## 2020-10-21 MED ORDER — ALLOPURINOL 100 MG PO TABS: 200.0000 mg | ORAL_TABLET | Freq: Every day | ORAL | 0 refills | Status: AC

## 2020-10-21 MED ORDER — INSULIN ASPART 100 UNIT/ML IJ SOLN: 0.0000 [IU] | Freq: Three times a day (TID) | INTRAMUSCULAR | 11 refills | Status: AC

## 2020-10-21 MED ORDER — AMOXICILLIN 500 MG PO CAPS: 500.0000 mg | ORAL_CAPSULE | Freq: Two times a day (BID) | ORAL | 0 refills | Status: AC

## 2020-10-21 MED ORDER — SACUBITRIL-VALSARTAN 24-26 MG PO TABS: 1.0000 | ORAL_TABLET | Freq: Two times a day (BID) | ORAL | Status: AC

## 2020-10-21 MED ORDER — AMLODIPINE BESYLATE 5 MG PO TABS: 5.0000 mg | ORAL_TABLET | Freq: Every day | ORAL | 11 refills | Status: AC

## 2020-10-21 MED ORDER — EMPAGLIFLOZIN 10 MG PO TABS: 10.0000 mg | ORAL_TABLET | Freq: Every day | ORAL | 0 refills | Status: AC

## 2020-10-21 MED ORDER — HYDROCODONE-ACETAMINOPHEN 5-325 MG PO TABS: 1.0000 | ORAL_TABLET | ORAL | 0 refills | Status: AC | PRN

## 2020-10-21 MED ORDER — AMOXICILLIN 500 MG PO CAPS: 500.0000 mg | ORAL_CAPSULE | Freq: Two times a day (BID) | ORAL | Status: DC

## 2020-10-21 NOTE — Progress Notes (Signed)
Physical Therapy Treatment Patient Details Name: Isaiah [name] MRN: [id number] DOB: 08-Jun-1940 Today's Date: 10/21/2020    History of Present Illness Patient is a 80 y/o male who presents on 6/2 from [facility] nursing home due to SOB. CXR- pulmonary edema with concern for CHF exacerbation. EKG- A-fib. PMH includes CHF, DM2, CAD, A-fib, CKD, CVA, PVD, MI, DVT, dementia, HTN.    PT Comments    Patient not progressing well with mobility today secondary to pain in abdomen/back despite being premedicated. Requires Min A for bed mobility and standing transfers relying heavily on rails and RW to pull into standing. Only able to take a few steps to get to chair today with Min A declining further ambulation due to above. HR in 50s during session; Sp02 92% on 3L/min 02 [place]. Encouraged trying to increase activity and OOB as much as tolerated to improve strength/mobility. Continues to be appropriate for SNF. Will follow.    Follow Up Recommendations  SNF;Supervision for mobility/OOB     Equipment Recommendations  None recommended by PT    Recommendations for Other Services       Precautions / Restrictions Precautions Precautions: Fall;Other (comment) Precaution Comments: watch 02, bradycardia Restrictions Weight Bearing Restrictions: No    Mobility  Bed Mobility Overal bed mobility: Needs Assistance Bed Mobility: Supine to Sit     Supine to sit: HOB elevated;Min assist     General bed mobility comments: Able to bring LEs to EOB but assist needed with trunk and scooting hips forward, use of rail for support.    Transfers Overall transfer level: Needs assistance Equipment used: Rolling walker (2 wheeled) Transfers: Sit to/from Stand Sit to Stand: Min assist;From elevated surface         General transfer comment: Min A to power to standing with cues for hand placement/technique. Flexed trunk.  Ambulation/Gait Ambulation/Gait assistance: Min assist Gait Distance (Feet): 5  Feet Assistive device: Rolling walker (2 wheeled) Gait Pattern/deviations: Trunk flexed;Shuffle;Wide base of support Gait velocity: decreased   General Gait Details: Able to take a few steps to get to chair with Min A for balance/RW management; flexed posture, posterior bias. Declined further ambulation due to pain in back/abdomen.   Stairs             Wheelchair Mobility    Modified Rankin (Stroke Patients Only)       Balance Overall balance assessment: Needs assistance Sitting-balance support: Feet supported;No upper extremity supported Sitting balance-Leahy Scale: Fair Sitting balance - Comments: Prefers leaning on RW for back pain sitting EOB.   Standing balance support: During functional activity Standing balance-Leahy Scale: Poor Standing balance comment: Requires UE support in standing. Flexed posture.                            Cognition Arousal/Alertness: Awake/alert Behavior During Therapy: WFL for tasks assessed/performed Overall Cognitive Status: No family/caregiver present to determine baseline cognitive functioning                                 General Comments: Follows commands; stating his daughter is supposed to come visit today. HOH so needs repetition at times.      Exercises      General Comments General comments (skin integrity, edema, etc.): HR in 50s during session; Sp02 92% on 3L/min 02 [place].      Pertinent Vitals/Pain Pain Assessment: 0-10 Pain Score:  10-Worst pain ever Pain Location: back and abdomen Pain Descriptors / Indicators: Moaning;Sore;Grimacing;Discomfort Pain Intervention(s): Premedicated before session;Repositioned;Monitored during session;Limited activity within patient's tolerance    Home Living                      Prior Function            PT Goals (current goals can now be found in the care plan section) Progress towards PT goals: Not progressing toward goals - comment (due to  pain)    Frequency    Min 2X/week      PT Plan Current plan remains appropriate    Co-evaluation              AM-PAC PT "6 Clicks" Mobility   Outcome Measure  Help needed turning from your back to your side while in a flat bed without using bedrails?: A Little Help needed moving from lying on your back to sitting on the side of a flat bed without using bedrails?: A Little Help needed moving to and from a bed to a chair (including a wheelchair)?: A Little Help needed standing up from a chair using your arms (e.g., wheelchair or bedside chair)?: A Little Help needed to walk in hospital room?: A Little Help needed climbing 3-5 steps with a railing? : A Lot 6 Click Score: 17    End of Session Equipment Utilized During Treatment: Gait belt Activity Tolerance: Patient limited by pain Patient left: in chair;with call bell/phone within reach;with chair alarm set Nurse Communication: Mobility status PT Visit Diagnosis: Pain;Muscle weakness (generalized) (M62.81) Pain - part of body:  (back/abdomen)     Time: 4854-6270 PT Time Calculation (min) (ACUTE ONLY): 20 min  Charges:  $Therapeutic Activity: 8-22 mins                     Isaiah [name], PT, DPT Acute Rehabilitation Services Pager [phone] Office [phone]      [name] 10/21/2020, 1:32 PM

## 2020-10-21 NOTE — Discharge Summary (Addendum)
Physician Discharge Summary  Isaiah [name] ZOX:[id number] DOB: [birth year] DOA: 10/13/2020  PCP: [place], [name], MD  Admit date: 10/13/2020 Discharge date: 10/21/2020  Time spent: 60 minutes  Recommendations for Outpatient Follow-up:  Follow-up cardiology as outpatient Follow-up with neurosurgery as outpatient to discuss regarding restarting warfarin   Discharge Diagnoses:  Principal Problem:   Acute on chronic heart failure with preserved ejection fraction (HFpEF) (HCC) UTI Active Problems:   Chronic atrial fibrillation (HCC)   Essential hypertension   Type 2 diabetes mellitus with vascular disease (HCC)   CKD (chronic kidney disease), stage III [facility])   Discharge Condition: Stable  Diet recommendation: Heart healthy diet  Filed Weights   10/19/20 0437 10/20/20 0513 10/21/20 0214  Weight: 83.6 kg 84.4 kg 84.7 kg    History of present illness: 80 year old male with past medical history of chronic diastolic heart failure, atrial fibrillation not on anticoagulation due to intracranial hemorrhage, essential hypertension, diabetes mellitus type 2, CKD stage IIIb, fracture L1, OSA who has been at skilled nursing facility for past several weeks and started feeling short of breath.  Chest x-ray showed pulmonary edema, BNP 245.  Hospital Course:   Acute on chronic heart failure with preserved ejection fraction -EF 50 to 55% -Presented with shortness of breath; improved with diuresis -Has was treated with Lasix 60 mg IV every 12 hours; net - 10.0 -IV Lasix was discontinued and patient started on orsemide 40 mg p.o. twice daily  -Continue Entresto     Chronic atrial fibrillation -Heart rate controlled -Not a candidate for anticoagulation due to history of intracranial bleed -Patient was supposed to follow-up with neurosurgery to discuss restarting warfarin, I explained to daughter to keep that appointment and discussed with neurosurgeon regarding restarting warfarin     Abdominal  pain -Resolved -Patient complained of abdominal pain  yesterday, urinalysis was abnormal ; showed more than 50 WBC per high-power field -Urine culture has been obtained; grew Enterococcus.  Sensitive to ampicillin.  Started on amoxicillin 500 mg p.o. twice daily for 3 days. -Abdomen x-ray only shows stool in ascending and transverse colon, no SBO -Continue Colace for constipation     Hypertension -Blood pressure is stable, will restart amlodipine at lower dose of 5 mg daily -Continue Entresto      CKD stage III -Creatinine at baseline     Diabetes mellitus type 2 -CBG well controlled -Continue sliding scale insulin with NovoLog -Continue glipizide   Bradycardia -Chronic; heart rate remains in 50s and upper 40s -Cardiology is aware, patient has been asymptomatic -Follow-up cardiology as outpatient  Procedures:  Consultations:   Discharge Exam: Vitals:   10/21/20 0837 10/21/20 1108  BP: (!) 140/50 (!) 155/49  Pulse: (!) 42 (!) 47  Resp:  16  Temp:  97.8 F (36.6 C)  SpO2:  98%    General: Appears in no acute distress Cardiovascular: S1-S2, regular Respiratory: Clear to auscultation bilaterally  Discharge Instructions   Discharge Instructions     Diet - low sodium heart healthy   Complete by: As directed    Increase activity slowly   Complete by: As directed    No wound care   Complete by: As directed       Allergies as of 10/21/2020   No Known Allergies      Medication List     STOP taking these medications    acetaminophen 325 MG tablet Commonly known as: TYLENOL   ciprofloxacin 500 MG tablet Commonly known as: Cipro   povidone-iodine  10 % external solution Commonly known as: Betadine   sodium chloride flush 0.9 % Soln Commonly known as: NS   traMADol 50 MG tablet Commonly known as: ULTRAM       TAKE these medications    allopurinol 100 MG tablet Commonly known as: ZYLOPRIM Take 2 tablets (200 mg total) by mouth daily.    alum & mag hydroxide-simeth 400-400-40 MG/5ML suspension Commonly known as: MAALOX PLUS Take 30 mLs by mouth every 6 (six) hours as needed for indigestion.   amLODipine 5 MG tablet Commonly known as: NORVASC Take 1 tablet (5 mg total) by mouth daily. What changed:  medication strength how much to take   amoxicillin 500 MG capsule Commonly known as: AMOXIL Take 1 capsule (500 mg total) by mouth every 12 (twelve) hours for 3 days. Start taking on: October 22, 2020   Dermacloud Oint Apply 1 application topically 3 (three) times daily.   docusate sodium 100 MG capsule Commonly known as: COLACE Take 1 capsule (100 mg total) by mouth 2 (two) times daily.   empagliflozin 10 MG Tabs tablet Commonly known as: JARDIANCE Take 1 tablet (10 mg total) by mouth daily.   ferrous sulfate 325 (65 FE) MG tablet Take 1 tablet (325 mg total) by mouth 2 (two) times daily with a meal.   gabapentin 300 MG capsule Commonly known as: NEURONTIN Take 1 capsule (300 mg total) by mouth 3 (three) times daily. Take 300 mg by mouth three times a day and 600 mg at bedtime   glipiZIDE 2.5 MG 24 hr tablet Commonly known as: GLUCOTROL XL Take 2.5 mg by mouth daily with breakfast. What changed: Another medication with the same name was removed. Continue taking this medication, and follow the directions you see here.   HYDROcodone-acetaminophen 5-325 MG tablet Commonly known as: NORCO/VICODIN Take 1 tablet by mouth every 4 (four) hours as needed for moderate pain.   insulin aspart 100 UNIT/ML injection Commonly known as: novoLOG Inject 0-9 Units into the skin 3 (three) times daily with meals.   lidocaine 5 % Commonly known as: LIDODERM Place 1 patch onto the skin as directed. Apply to lower back topically for pain and 2 patches to lower back, remove old patch What changed: Another medication with the same name was removed. Continue taking this medication, and follow the directions you see here.    Medihoney Wound/Burn Dressing Gel Apply for wound care topically as directed   methocarbamol 500 MG tablet Commonly known as: ROBAXIN Take 500 mg by mouth in the morning and at bedtime.   multivitamin with minerals Tabs tablet Take 1 tablet by mouth daily.   NUTRITIONAL DRINK PO Take 4 fluid ounces by mouth in the morning and at bedtime.   ondansetron 8 MG tablet Commonly known as: ZOFRAN Take 8 mg by mouth every 12 (twelve) hours as needed for nausea or vomiting.   polyethylene glycol 17 g packet Commonly known as: MIRALAX / GLYCOLAX Take 17 g by mouth 2 (two) times daily.   potassium chloride 10 MEQ tablet Commonly known as: KLOR-CON Take 2 tablets (20 mEq total) by mouth daily.   PROSTAT PO Take 30 mLs by mouth daily.   sacubitril-valsartan 24-26 MG Commonly known as: ENTRESTO Take 1 tablet by mouth 2 (two) times daily.   sennosides-docusate sodium 8.6-50 MG tablet Commonly known as: SENOKOT-S Take 1 tablet by mouth 2 (two) times daily.   SEROQUEL PO Take 76 mg by mouth at bedtime.   sildenafil 20 MG  tablet Commonly known as: REVATIO Take 20 mg by mouth 3 (three) times daily.   Torsemide 40 MG Tabs Take 40 mg by mouth 2 (two) times daily. What changed:  medication strength Another medication with the same name was removed. Continue taking this medication, and follow the directions you see here.   traZODone 50 MG tablet Commonly known as: DESYREL Take 25 mg by mouth at bedtime.       No Known Allergies  Contact information for follow-up providers     [place], [name], MD.   Specialty: Family Medicine Contact information: [phone] [address] [place] [address] [phone]              Contact information for after-discharge care     Destination     [facility] .   Service: Skilled Nursing Contact information: [address] [place] [address] [phone]                       The results of significant diagnostics from this hospitalization (including imaging, microbiology, ancillary and laboratory) are listed below for reference.    Significant Diagnostic Studies: DG Chest 2 View  Result Date: 10/13/2020 CLINICAL DATA:  Shortness of breath.  CHF. EXAM: CHEST - 2 VIEW COMPARISON:  06/02/2020. FINDINGS: Enlarged cardiac silhouette. Calcific atherosclerosis of the aorta small to moderate layering bilateral pleural effusions. Diffuse interstitial prominence with patchy airspace opacities. Postsurgical changes of CABG with median sternotomy. Thoracic spinal fusion hardware. IMPRESSION: Findings suggestive of congestive heart failure with cardiomegaly, layering small to moderate bilateral pleural effusions, and suspected interstitial and alveolar pulmonary edema. Superimposed infection at the bases is not excluded. Electronically Signed   By: Feliberto Harts MD   On: 10/13/2020 13:27   DG Abd Portable 2V  Result Date: 10/18/2020 CLINICAL DATA:  Abdominal pain. EXAM: PORTABLE ABDOMEN - 2 VIEW COMPARISON:  CT 07/13/2020 FINDINGS: Divided supine and upright views of the abdomen. There is no evidence of free intra-abdominal air. Mild gaseous gastric distension. No small bowel dilatation or evidence of obstruction. Small to moderate formed stool in the ascending and transverse colon with only minimal stool distally. Advanced vascular calcifications. No radiopaque calculi. IMPRESSION: Mild gaseous gastric distension, can be seen with gastroparesis. No evidence of bowel obstruction or free air. Electronically Signed   By: Narda Rutherford M.D.   On: 10/18/2020 22:50   ECHOCARDIOGRAM COMPLETE  Result Date: 10/14/2020    ECHOCARDIOGRAM REPORT   Patient Name:   Isaiah [name] [facility] Date of Exam: 10/14/2020 Medical Rec #:  [id number]      Height:       69.0 in Accession #:    [id number]     Weight:       190.0 lb Date of Birth:  December 07, 1940      BSA:          2.022 m Patient Age:    80  years       BP:           116/52 mmHg Patient Gender: M              HR:           54 bpm. Exam Location:  Inpatient Procedure: 2D Echo, Cardiac Doppler and Color Doppler Indications:    CHF  History:        Patient has prior history of Echocardiogram examinations, most  recent 05/22/2020. Previous Myocardial Infarction and CAD; Risk                 Factors:Dyslipidemia and Hypertension.  Sonographer:    Shirlean Kelly Referring Phys: [id number] Carlton Adam  Sonographer Comments: Image acquisition challenging due to patient body habitus. IMPRESSIONS  1. Left ventricular ejection fraction, by estimation, is 50 to 55%. The left ventricle has low normal function. The left ventricle has no regional wall motion abnormalities. Diastolic function indeterminant due to atrial fibrillation.  2. Right ventricular systolic function is mildly reduced. The right ventricular size is normal. Tricuspid regurgitation signal is inadequate for assessing PA pressure.  3. Left atrial size was severely dilated.  4. Right atrial size was mildly dilated.  5. The mitral valve is grossly normal. Mild mitral valve regurgitation.  6. The aortic valve is tricuspid. There is mild calcification of the aortic valve. There is mild thickening of the aortic valve. Aortic valve regurgitation is not visualized. Mild aortic valve sclerosis is present, with no evidence of aortic valve stenosis.  7. The inferior vena cava is dilated in size with >50% respiratory variability, suggesting right atrial pressure of 8 mmHg. Comparison(s): Compared to prior TTE in 05/2020, the LVEF appears mildly improved (previously 45-50%) with improved anteroseptal wall motion. FINDINGS  Left Ventricle: Left ventricular ejection fraction, by estimation, is 50 to 55%. The left ventricle has low normal function. The left ventricle has no regional wall motion abnormalities. The left ventricular internal cavity size was normal in size. There is borderline left  ventricular hypertrophy. Diastolic function indeterminant due to atrial fibrillation. Right Ventricle: The right ventricular size is normal. Right vetricular wall thickness was not well visualized. Right ventricular systolic function is mildly reduced. Tricuspid regurgitation signal is inadequate for assessing PA pressure. Left Atrium: Left atrial size was severely dilated. Right Atrium: Right atrial size was mildly dilated. Pericardium: There is no evidence of pericardial effusion. Mitral Valve: The mitral valve is grossly normal. There is mild thickening of the mitral valve leaflet(s). There is mild calcification of the mitral valve leaflet(s). Mild to moderate mitral annular calcification. Mild mitral valve regurgitation. MV peak  gradient, 7.1 mmHg. The mean mitral valve gradient is 2.0 mmHg. Tricuspid Valve: The tricuspid valve is normal in structure. Tricuspid valve regurgitation is mild. Aortic Valve: The aortic valve is tricuspid. There is mild calcification of the aortic valve. There is mild thickening of the aortic valve. Aortic valve regurgitation is not visualized. Mild aortic valve sclerosis is present, with no evidence of aortic valve stenosis. Aortic valve mean gradient measures 6.0 mmHg. Aortic valve peak gradient measures 9.9 mmHg. Aortic valve area, by VTI measures 1.36 cm. Pulmonic Valve: The pulmonic valve was normal in structure. Pulmonic valve regurgitation is trivial. Aorta: The aortic root and ascending aorta are structurally normal, with no evidence of dilitation. Venous: The inferior vena cava is dilated in size with greater than 50% respiratory variability, suggesting right atrial pressure of 8 mmHg. IAS/Shunts: No atrial level shunt detected by color flow Doppler.  LEFT VENTRICLE PLAX 2D LVIDd:         4.50 cm  Diastology LVIDs:         3.30 cm  LV e' lateral:   4.74 cm/s LV PW:         1.00 cm  LV E/e' lateral: 33.5 LV IVS:        1.00 cm LVOT diam:     1.90 cm LV SV:  46 LV SV  Index:   23 LVOT Area:     2.84 cm  RIGHT VENTRICLE            IVC RV S prime:     7.01 cm/s  IVC diam: 2.40 cm LEFT ATRIUM            Index       RIGHT ATRIUM           Index LA diam:      4.30 cm  2.13 cm/m  RA Area:     28.30 cm LA Vol (A4C): 111.0 ml 54.91 ml/m RA Volume:   106.00 ml 52.43 ml/m  AORTIC VALVE                    PULMONIC VALVE AV Area (Vmax):    1.16 cm     PV Vmax:       1.03 m/s AV Area (Vmean):   1.14 cm     PV Peak grad:  4.2 mmHg AV Area (VTI):     1.36 cm AV Vmax:           157.00 cm/s AV Vmean:          109.000 cm/s AV VTI:            0.342 m AV Peak Grad:      9.9 mmHg AV Mean Grad:      6.0 mmHg LVOT Vmax:         64.00 cm/s LVOT Vmean:        43.700 cm/s LVOT VTI:          0.164 m LVOT/AV VTI ratio: 0.48  AORTA Ao Root diam: 2.90 cm Ao Asc diam:  3.00 cm MITRAL VALVE MV Area (PHT): 3.99 cm     SHUNTS MV Area VTI:   1.60 cm     Systemic VTI:  0.16 m MV Peak grad:  7.1 mmHg     Systemic Diam: 1.90 cm MV Mean grad:  2.0 mmHg MV Vmax:       1.33 m/s MV Vmean:      53.5 cm/s MV Decel Time: 190 msec MV E velocity: 159.00 cm/s MV A velocity: 50.10 cm/s MV E/A ratio:  3.17 Laurance Flatten MD Electronically signed by Laurance Flatten MD Signature Date/Time: 10/14/2020/3:12:05 PM    Final    CT Angio Abd/Pel w/ and/or w/o  Result Date: 10/19/2020 CLINICAL DATA:  Mesenteric ischemia, acute Abdominal pain, acute, nonlocalized EXAM: CTA ABDOMEN AND PELVIS WITHOUT AND WITH CONTRAST TECHNIQUE: Multidetector CT imaging of the abdomen and pelvis was performed using the standard protocol during bolus administration of intravenous contrast. Multiplanar reconstructed images and MIPs were obtained and reviewed to evaluate the vascular anatomy. CONTRAST:  34mL OMNIPAQUE IOHEXOL 350 MG/ML SOLN COMPARISON:  CT abdomen pelvis 03/14/2020, CT lumbar spine 12/12/2018, CT chest 04/02/2020 FINDINGS: VASCULAR No extravasation of intravenous contrast. Aorta: Severe atherosclerotic plaque. Normal caliber  aorta without aneurysm, dissection, vasculitis or significant stenosis. Celiac: Severe atherosclerotic plaque. Patent without evidence of aneurysm, dissection, vasculitis or significant stenosis. SMA: Severe atherosclerotic plaque. Patent without evidence of aneurysm, dissection, vasculitis or significant stenosis. Renals: Severe atherosclerotic plaque. Both renal arteries are patent without evidence of aneurysm, dissection, vasculitis, fibromuscular dysplasia or significant stenosis. IMA: Severe atherosclerotic plaque. Patent without evidence of aneurysm, dissection, vasculitis or significant stenosis. Inflow: Severe atherosclerotic plaque. Patent without evidence of aneurysm, dissection, vasculitis or significant stenosis. Proximal Outflow: Severe atherosclerotic plaque. Surgical changes along bilateral  inguinal regions likely related to prior femoral access with no definite pseudoaneurysm noted. Bilateral common femoral and visualized portions of the superficial and profunda femoral arteries are patent without evidence of aneurysm, dissection, vasculitis or significant stenosis. Veins: The main portal, splenic, mesenteric veins are patent. Review of the MIP images confirms the above findings. NON-VASCULAR Lower chest: Coronary artery calcifications. Status post coronary artery bypass and coronary artery stents.Bilateral small, right greater than left, pleural effusions. Bilateral lower lobe almost complete collapse, left greater than right. Linear atelectasis within the lingula. Hepatobiliary: Cirrhotic morphology of the liver. No focal lesion. No biliary ductal dilatation. Hydropic gallbladder. Question fundal gallbladder wall thickening (10: 55-65). No bowel thickening or pericholecystic fluid. No CT evidence of calcified gallstones. Pancreas: No focal lesion. Normal pancreatic contour. No surrounding inflammatory changes. No main pancreatic ductal dilatation. Spleen: Normal in size without focal abnormality.  Adrenals/Urinary Tract: No adrenal nodule bilaterally. Bilateral kidneys enhance symmetrically. Fluid density lesions likely represent simple renal cysts. Largest of these measures up to 1.5 cm on the left and 2.3 cm on the right. Subcentimeter hypodensities are too small to characterize. No hydronephrosis. No hydroureter. Urinary bladder wall thickening with pervesicular fat stranding. The urinary bladder is decompressed with Foley catheter terminating within its lumen. Stomach/Bowel: Stomach is within normal limits. No evidence of bowel wall thickening or dilatation. Scattered colonic diverticulosis with no acute diverticulitis. Stool throughout the colon. No pneumatosis. The appendix is not definitely identified. Lymphatic: No lymphadenopathy. Reproductive: Prostate is unremarkable. Other: No intraperitoneal free fluid. No intraperitoneal free gas. No organized fluid collection. Musculoskeletal: Lobulated umbilical fat containing hernia. Likely subacute left displaced 8th rib fracture. No suspicious lytic or blastic osseous lesions. No acute displaced fracture. Multilevel degenerative changes of the spine. Partially visualized thoracolumbar surgical hardware posterolateral fusion with interval development of lucency surrounding the L1 vertebral body screws associated with anterior wall/superior endplate vertebral body erosion IMPRESSION: VASCULAR 1. No acute vascular abnormality. 2. Aortic Atherosclerosis (ICD10-I70.0) - severe. NON-VASCULAR 1. Bilateral small, right greater than left, pleural effusions. 2. Urinary bladder wall thickening with perivesicular fat stranding. Collapse with Foley catheter in appropriate position. Correlate with urinalysis for infection. 3. Nonspecific hydropic gallbladder with question of fundal gallbladder wall thickening. Consider right upper quadrant ultrasound for further evaluation. 4. Cirrhosis with no CT findings of portal hypertension. 5. Scattered colonic diverticulosis with  no acute diverticulitis. 6. Partially visualized thoracolumbar surgical hardware posterolateral fusion with interval development of lucency surrounding the L1 vertebral body screws associated with anterior wall/superior endplate vertebral body erosion 7. Likely subacute left displaced 8th rib fracture. Electronically Signed   By: Tish FredericksonMorgane  Naveau M.D.   On: 10/19/2020 05:04    Microbiology: Recent Results (from the past 240 hour(s))  SARS CORONAVIRUS 2 (TAT 6-24 HRS) Nasopharyngeal Nasopharyngeal Swab     Status: None   Collection Time: 10/13/20  8:04 PM   Specimen: Nasopharyngeal Swab  Result Value Ref Range Status   SARS Coronavirus 2 NEGATIVE NEGATIVE Final    Comment: (NOTE) SARS-CoV-2 target nucleic acids are NOT DETECTED.  The SARS-CoV-2 RNA is generally detectable in upper and lower respiratory specimens during the acute phase of infection. Negative results do not preclude SARS-CoV-2 infection, do not rule out co-infections with other pathogens, and should not be used as the sole basis for treatment or other patient management decisions. Negative results must be combined with clinical observations, patient history, and epidemiological information. The expected result is Negative.  Fact Sheet for Patients: HairSlick.nohttps://www.fda.gov/media/138098/download  Fact Sheet for Healthcare  Providers: quierodirigir.com  This test is not yet approved or cleared by the [place] and  has been authorized for detection and/or diagnosis of SARS-CoV-2 by FDA under an Emergency Use Authorization (EUA). This EUA will remain  in effect (meaning this test can be used) for the duration of the COVID-19 declaration under Se ction 564(b)(1) of the Act, 21 U.S.C. section 360bbb-3(b)(1), unless the authorization is terminated or revoked sooner.  Performed at [facility], [address], [place], [place] [address]   Culture, Urine     Status: Abnormal   Collection  Time: 10/19/20 11:55 AM   Specimen: Urine, Random  Result Value Ref Range Status   Specimen Description URINE, RANDOM  Final   Special Requests   Final    NONE Performed at [facility], [address], [place], [place] [address]    Culture 70,000 COLONIES/mL ENTEROCOCCUS FAECALIS (A)  Final   Report Status 10/21/2020 FINAL  Final   Organism ID, Bacteria ENTEROCOCCUS FAECALIS (A)  Final      Susceptibility   Enterococcus faecalis - MIC*    AMPICILLIN <=2 SENSITIVE Sensitive     NITROFURANTOIN <=16 SENSITIVE Sensitive     VANCOMYCIN 1 SENSITIVE Sensitive     * 70,000 COLONIES/mL ENTEROCOCCUS FAECALIS  SARS CORONAVIRUS 2 (TAT 6-24 HRS) Nasopharyngeal Nasopharyngeal Swab     Status: None   Collection Time: 10/19/20 12:48 PM   Specimen: Nasopharyngeal Swab  Result Value Ref Range Status   SARS Coronavirus 2 NEGATIVE NEGATIVE Final    Comment: (NOTE) SARS-CoV-2 target nucleic acids are NOT DETECTED.  The SARS-CoV-2 RNA is generally detectable in upper and lower respiratory specimens during the acute phase of infection. Negative results do not preclude SARS-CoV-2 infection, do not rule out co-infections with other pathogens, and should not be used as the sole basis for treatment or other patient management decisions. Negative results must be combined with clinical observations, patient history, and epidemiological information. The expected result is Negative.  Fact Sheet for Patients: HairSlick.no  Fact Sheet for Healthcare Providers: quierodirigir.com  This test is not yet approved or cleared by the [place] FDA and  has been authorized for detection and/or diagnosis of SARS-CoV-2 by FDA under an Emergency Use Authorization (EUA). This EUA will remain  in effect (meaning this test can be used) for the duration of the COVID-19 declaration under Se ction 564(b)(1) of the Act, 21 U.S.C. section 360bbb-3(b)(1), unless  the authorization is terminated or revoked sooner.  Performed at [facility], [address], [place], [place] [address]      Labs: Basic Metabolic Panel: Recent Labs  Lab 10/17/20 [phone] 10/18/20 0423 10/18/20 2215 10/20/20 0259 10/21/20 0347  NA 141 138 138 137 136  K 4.3 4.3 4.9 5.1 4.8  CL 95* 95* 92* 94* 93*  CO2 39* 38* 40* 36* 35*  GLUCOSE 109* 107* 120* 117* 112*  BUN 43* 40* 39* 41* 44*  CREATININE 1.35* 1.29* 1.32* 1.36* 1.50*  CALCIUM 8.9 8.6* 8.9 8.8* 8.8*   Liver Function Tests: Recent Labs  Lab 10/18/20 2215  AST 17  ALT 10  ALKPHOS 97  BILITOT 0.3  PROT 7.4  ALBUMIN 2.8*   Recent Labs  Lab 10/18/20 2215  LIPASE 44   No results for input(s): AMMONIA in the last 168 hours. CBC: Recent Labs  Lab 10/18/20 2215 10/20/20 0259  WBC 9.6 8.0  NEUTROABS 6.5  --   HGB 9.7* 8.8*  HCT  32.8* 29.9*  MCV 101.2* 102.0*  PLT 288 227   Cardiac Enzymes: No results for input(s): CKTOTAL, CKMB, CKMBINDEX, TROPONINI in the last 168 hours. BNP: BNP (last 3 results) Recent Labs    05/20/20 1133 06/01/20 0511 10/13/20 1255  BNP 618.9* 275.2* 245.0*    ProBNP (last 3 results) No results for input(s): PROBNP in the last 8760 hours.  CBG: Recent Labs  Lab 10/20/20 1355 10/20/20 1541 10/20/20 2055 10/21/20 0544 10/21/20 1109  GLUCAP 168* 159* 147* 121* 150*       Signed:  Meredeth Ide MD.  [facility] 10/21/2020, 2:09 PM

## 2020-10-21 NOTE — Progress Notes (Signed)
[facility] here to pick patient up for for transport to [facility].

## 2020-10-21 NOTE — TOC Transition Note (Addendum)
Transition of Care [facility]) - CM/SW Discharge Note   Patient Details  Name: Isaiah [name] MRN: [id number] Date of Birth: 1940/05/30  [facility]) CM/SW Contact:  Baldemar Lenis, LCSW Phone Number: 10/21/2020, 3:43 PM   Clinical Narrative:   Nurse to call report to [phone], Room 202    Final next level of care: Skilled Nursing Facility Barriers to Discharge: Barriers Resolved   Patient Goals and CMS Choice Patient states their goals for this hospitalization and ongoing recovery are:: To be able return home      Discharge Placement              Patient chooses bed at: [facility] Patient to be transferred to facility by: PTAR Name of family member notified: Self Patient and family notified of of transfer: 10/21/20  Discharge Plan and Services                                     Social Determinants of Health (SDOH) Interventions     Readmission Risk Interventions Readmission Risk Prevention Plan 06/08/2020  Transportation Screening Complete  Medication Review (RN Care Manager) Complete  HRI or Home Care Consult Complete  SW Recovery Care/Counseling Consult Complete  Palliative Care Screening Not Applicable  Skilled Nursing Facility Not Applicable  Some recent data might be hidden

## 2020-11-15 ENCOUNTER — Other Ambulatory Visit: Payer: Self-pay | Admitting: Neurological Surgery

## 2020-12-07 NOTE — Pre-Procedure Instructions (Signed)
Surgical Instructions    Your procedure is scheduled on Monday 12/12/20.   Report to [facility] Main Entrance "A" at 08:00 A.M., then check in with the Admitting office.  Call this number if you have problems the morning of surgery:  [phone]   If you have any questions prior to your surgery date call [phone]: Open Monday-Friday 8am-4pm    Remember:  Do not eat after midnight the night before your surgery  You may drink clear liquids until 07:00 A.M. the morning of your surgery.   Clear liquids allowed are: Water, Non-Citrus Juices (without pulp), Carbonated Beverages, Clear Tea, Black Coffee Only, and Gatorade    Take these medicines the morning of surgery with A SIP OF WATER   allopurinol (ZYLOPRIM)  amLODipine (NORVASC)  docusate sodium (COLACE)  gabapentin (NEURONTIN)   methocarbamol (ROBAXIN)   sennosides-docusate sodium (SENOKOT-S)   Take the following medications if needed:  HYDROcodone-acetaminophen (NORCO/VICODIN)  ondansetron (ZOFRAN) WHAT DO I DO ABOUT MY DIABETES MEDICATION?   Do not take oral diabetes medicines (pills) the morning of surgery.  empagliflozin (JARDIANCE) DO NOT TAKE JARDIANCE the day before surgery or the morning of surgery.  glipiZIDE (GLUCOTROL XL) DO NOT TAKE Glucotrol the morning of surgery.  insulin aspart (NOVOLOG)  DO NOT TAKE insulin aspart Novolog insulin the morning of surgery.   The day of surgery, do not take other diabetes injectables, including Byetta (exenatide), Bydureon (exenatide ER), Victoza (liraglutide), or Trulicity (dulaglutide).  If your CBG is greater than 220 mg/dL, you may take  of your sliding scale (correction) dose of insulin.   HOW TO MANAGE YOUR DIABETES BEFORE AND AFTER SURGERY  Why is it important to control my blood sugar before and after surgery? Improving blood sugar levels before and after surgery helps healing and can limit problems. A way of improving blood sugar control is eating a  healthy diet by:  Eating less sugar and carbohydrates  Increasing activity/exercise  Talking with your doctor about reaching your blood sugar goals High blood sugars (greater than 180 mg/dL) can raise your risk of infections and slow your recovery, so you will need to focus on controlling your diabetes during the weeks before surgery. Make sure that the doctor who takes care of your diabetes knows about your planned surgery including the date and location.  How do I manage my blood sugar before surgery? Check your blood sugar at least 4 times a day, starting 2 days before surgery, to make sure that the level is not too high or low.  Check your blood sugar the morning of your surgery when you wake up and every 2 hours until you get to the Short Stay unit.  If your blood sugar is less than 70 mg/dL, you will need to treat for low blood sugar: Do not take insulin. Treat a low blood sugar (less than 70 mg/dL) with  cup of clear juice (cranberry or apple), 4 glucose tablets, OR glucose gel. Recheck blood sugar in 15 minutes after treatment (to make sure it is greater than 70 mg/dL). If your blood sugar is not greater than 70 mg/dL on recheck, call [phone] for further instructions. Report your blood sugar to the short stay nurse when you get to Short Stay.  If you are admitted to the hospital after surgery: Your blood sugar will be checked by the staff and you will probably be given insulin after surgery (instead of oral diabetes medicines) to make sure you have good blood sugar levels. The  goal for blood sugar control after surgery is 80-180 mg/dL.      As of today, STOP taking any Aspirin (unless otherwise instructed by your surgeon) Aleve, Naproxen, Ibuprofen, Motrin, Advil, Goody's, BC's, all herbal medications, fish oil, and all vitamins.                     Do NOT Smoke (Tobacco/Vaping) or drink Alcohol 24 hours prior to your procedure.  If you use a CPAP at night, you may bring  all equipment for your overnight stay.   Contacts, glasses, piercing's, hearing aid's, dentures or partials may not be worn into surgery, please bring cases for these belongings.    For patients admitted to the hospital, discharge time will be determined by your treatment team.   Patients discharged the day of surgery will not be allowed to drive home, and someone needs to stay with them for 24 hours.  ONLY 1 SUPPORT PERSON MAY BE PRESENT WHILE YOU ARE IN SURGERY. IF YOU ARE TO BE ADMITTED ONCE YOU ARE IN YOUR ROOM YOU WILL BE ALLOWED TWO (2) VISITORS.  Minor children may have two parents present. Special consideration for safety and communication needs will be reviewed on a case by case basis.   Special instructions:   Newborn- Preparing For Surgery  Before surgery, you can play an important role. Because skin is not sterile, your skin needs to be as free of germs as possible. You can reduce the number of germs on your skin by washing with CHG (chlorahexidine gluconate) Soap before surgery.  CHG is an antiseptic cleaner which kills germs and bonds with the skin to continue killing germs even after washing.    Oral Hygiene is also important to reduce your risk of infection.  Remember - BRUSH YOUR TEETH THE MORNING OF SURGERY WITH YOUR REGULAR TOOTHPASTE  Please do not use if you have an allergy to CHG or antibacterial soaps. If your skin becomes reddened/irritated stop using the CHG.  Do not shave (including legs and underarms) for at least 48 hours prior to first CHG shower. It is OK to shave your face.  Please follow these instructions carefully.   Shower the NIGHT BEFORE SURGERY and the MORNING OF SURGERY  If you chose to wash your hair, wash your hair first as usual with your normal shampoo.  After you shampoo, rinse your hair and body thoroughly to remove the shampoo.  Use CHG Soap as you would any other liquid soap. You can apply CHG directly to the skin and wash gently with a  scrungie or a clean washcloth.   Apply the CHG Soap to your body ONLY FROM THE NECK DOWN.  Do not use on open wounds or open sores. Avoid contact with your eyes, ears, mouth and genitals (private parts). Wash Face and genitals (private parts)  with your normal soap.   Wash thoroughly, paying special attention to the area where your surgery will be performed.  Thoroughly rinse your body with warm water from the neck down.  DO NOT shower/wash with your normal soap after using and rinsing off the CHG Soap.  Pat yourself dry with a CLEAN TOWEL.  Wear CLEAN PAJAMAS to bed the night before surgery  Place CLEAN SHEETS on your bed the night before your surgery  DO NOT SLEEP WITH PETS.   Day of Surgery: Shower with CHG soap. Do not wear jewelry, make up, nail polish, gel polish, artificial nails, or any other type of  covering on natural nails including finger and toenails. If patients have artificial nails, gel coating, etc. that need to be removed by a nail salon please have this removed prior to surgery. Surgery may need to be canceled/delayed if the surgeon/ anesthesia feels like the patient is unable to be adequately monitored. Do not wear lotions, powders, perfumes/colognes, or deodorant. Do not shave 48 hours prior to surgery.  Men may shave face and neck. Do not bring valuables to the hospital. [facility] is not responsible for any belongings or valuables. Wear Clean/Comfortable clothing the morning of surgery Remember to brush your teeth WITH YOUR REGULAR TOOTHPASTE.   Please read over the following fact sheets that you were given.

## 2020-12-08 ENCOUNTER — Inpatient Hospital Stay (HOSPITAL_COMMUNITY)
Admission: RE | Admit: 2020-12-08 | Discharge: 2020-12-08 | Disposition: A | Discharge status: Home / Self Care | Payer: Medicare Other | Source: Ambulatory Visit

## 2020-12-08 DIAGNOSIS — I272 Pulmonary hypertension, unspecified: Secondary | ICD-10-CM | POA: Diagnosis not present

## 2020-12-08 DIAGNOSIS — N183 Chronic kidney disease, stage 3 unspecified: Secondary | ICD-10-CM | POA: Diagnosis not present

## 2020-12-08 DIAGNOSIS — I5033 Acute on chronic diastolic (congestive) heart failure: Secondary | ICD-10-CM | POA: Diagnosis not present

## 2020-12-08 DIAGNOSIS — I130 Hypertensive heart and chronic kidney disease with heart failure and stage 1 through stage 4 chronic kidney disease, or unspecified chronic kidney disease: Secondary | ICD-10-CM | POA: Diagnosis not present

## 2020-12-08 DIAGNOSIS — I361 Nonrheumatic tricuspid (valve) insufficiency: Secondary | ICD-10-CM | POA: Diagnosis not present

## 2020-12-08 DIAGNOSIS — I482 Chronic atrial fibrillation, unspecified: Secondary | ICD-10-CM | POA: Diagnosis not present

## 2020-12-08 DIAGNOSIS — I340 Nonrheumatic mitral (valve) insufficiency: Secondary | ICD-10-CM | POA: Diagnosis not present

## 2020-12-08 NOTE — Progress Notes (Signed)
Patient was a no show for his 11:00 PAT appointment today. Called and left a voicemail at patient's home number and no answer when call placed to patient's daughter. Secretary for PAT made aware.

## 2020-12-09 DIAGNOSIS — I130 Hypertensive heart and chronic kidney disease with heart failure and stage 1 through stage 4 chronic kidney disease, or unspecified chronic kidney disease: Secondary | ICD-10-CM | POA: Diagnosis not present

## 2020-12-09 DIAGNOSIS — N183 Chronic kidney disease, stage 3 unspecified: Secondary | ICD-10-CM | POA: Diagnosis not present

## 2020-12-09 DIAGNOSIS — I482 Chronic atrial fibrillation, unspecified: Secondary | ICD-10-CM | POA: Diagnosis not present

## 2020-12-09 DIAGNOSIS — I5033 Acute on chronic diastolic (congestive) heart failure: Secondary | ICD-10-CM | POA: Diagnosis not present

## 2020-12-09 NOTE — Progress Notes (Signed)
Have made several attempts to contact pt for his surgery scheduled for Monday, 12/12/20. He had a PAT appt on 12/08/20 and was a no show, they called and left message on his voicemail and tried to contact his daughter with no luck. I called pt this AM and left a message for pt to return my call. I just called his son-in-law's number and he states pt is in [facility] with low oxygen levels, high white count and the staff at [facility] states he may have to be moved to [facility]. The family has not notified Dr. Maurice Small so I have called and left a message for Shanda Bumps (scheduler) about the patient being in the hospital.

## 2020-12-10 DIAGNOSIS — I4891 Unspecified atrial fibrillation: Secondary | ICD-10-CM | POA: Diagnosis not present

## 2020-12-10 DIAGNOSIS — Z951 Presence of aortocoronary bypass graft: Secondary | ICD-10-CM | POA: Diagnosis not present

## 2020-12-10 DIAGNOSIS — N189 Chronic kidney disease, unspecified: Secondary | ICD-10-CM | POA: Diagnosis not present

## 2020-12-11 DIAGNOSIS — I4891 Unspecified atrial fibrillation: Secondary | ICD-10-CM | POA: Diagnosis not present

## 2020-12-11 DIAGNOSIS — Z951 Presence of aortocoronary bypass graft: Secondary | ICD-10-CM | POA: Diagnosis not present

## 2020-12-11 DIAGNOSIS — N189 Chronic kidney disease, unspecified: Secondary | ICD-10-CM | POA: Diagnosis not present

## 2020-12-12 ENCOUNTER — Inpatient Hospital Stay (HOSPITAL_COMMUNITY): Admission: RE | Admit: 2020-12-12 | Payer: Medicare Other | Source: Home / Self Care | Admitting: Neurological Surgery

## 2020-12-12 ENCOUNTER — Encounter (HOSPITAL_COMMUNITY): Admission: RE | Payer: Self-pay | Source: Home / Self Care

## 2020-12-12 DIAGNOSIS — Z951 Presence of aortocoronary bypass graft: Secondary | ICD-10-CM | POA: Diagnosis not present

## 2020-12-12 DIAGNOSIS — I4891 Unspecified atrial fibrillation: Secondary | ICD-10-CM | POA: Diagnosis not present

## 2020-12-12 DIAGNOSIS — N189 Chronic kidney disease, unspecified: Secondary | ICD-10-CM | POA: Diagnosis not present

## 2020-12-12 SURGERY — POSTERIOR LUMBAR FUSION 1 LEVEL
Anesthesia: General

## 2020-12-13 DIAGNOSIS — N189 Chronic kidney disease, unspecified: Secondary | ICD-10-CM | POA: Diagnosis not present

## 2020-12-13 DIAGNOSIS — Z951 Presence of aortocoronary bypass graft: Secondary | ICD-10-CM | POA: Diagnosis not present

## 2020-12-13 DIAGNOSIS — I4891 Unspecified atrial fibrillation: Secondary | ICD-10-CM | POA: Diagnosis not present

## 2021-01-12 DEATH — deceased

## 2022-07-30 IMAGING — DX DG CHEST 1V PORT
1 series · 1 of 1 positions shown · non-contrast
Comparison: 03/20/2020

CLINICAL DATA: Short of breath.  Weakness.

EXAM:
PORTABLE CHEST 1 VIEW

[chest ap]
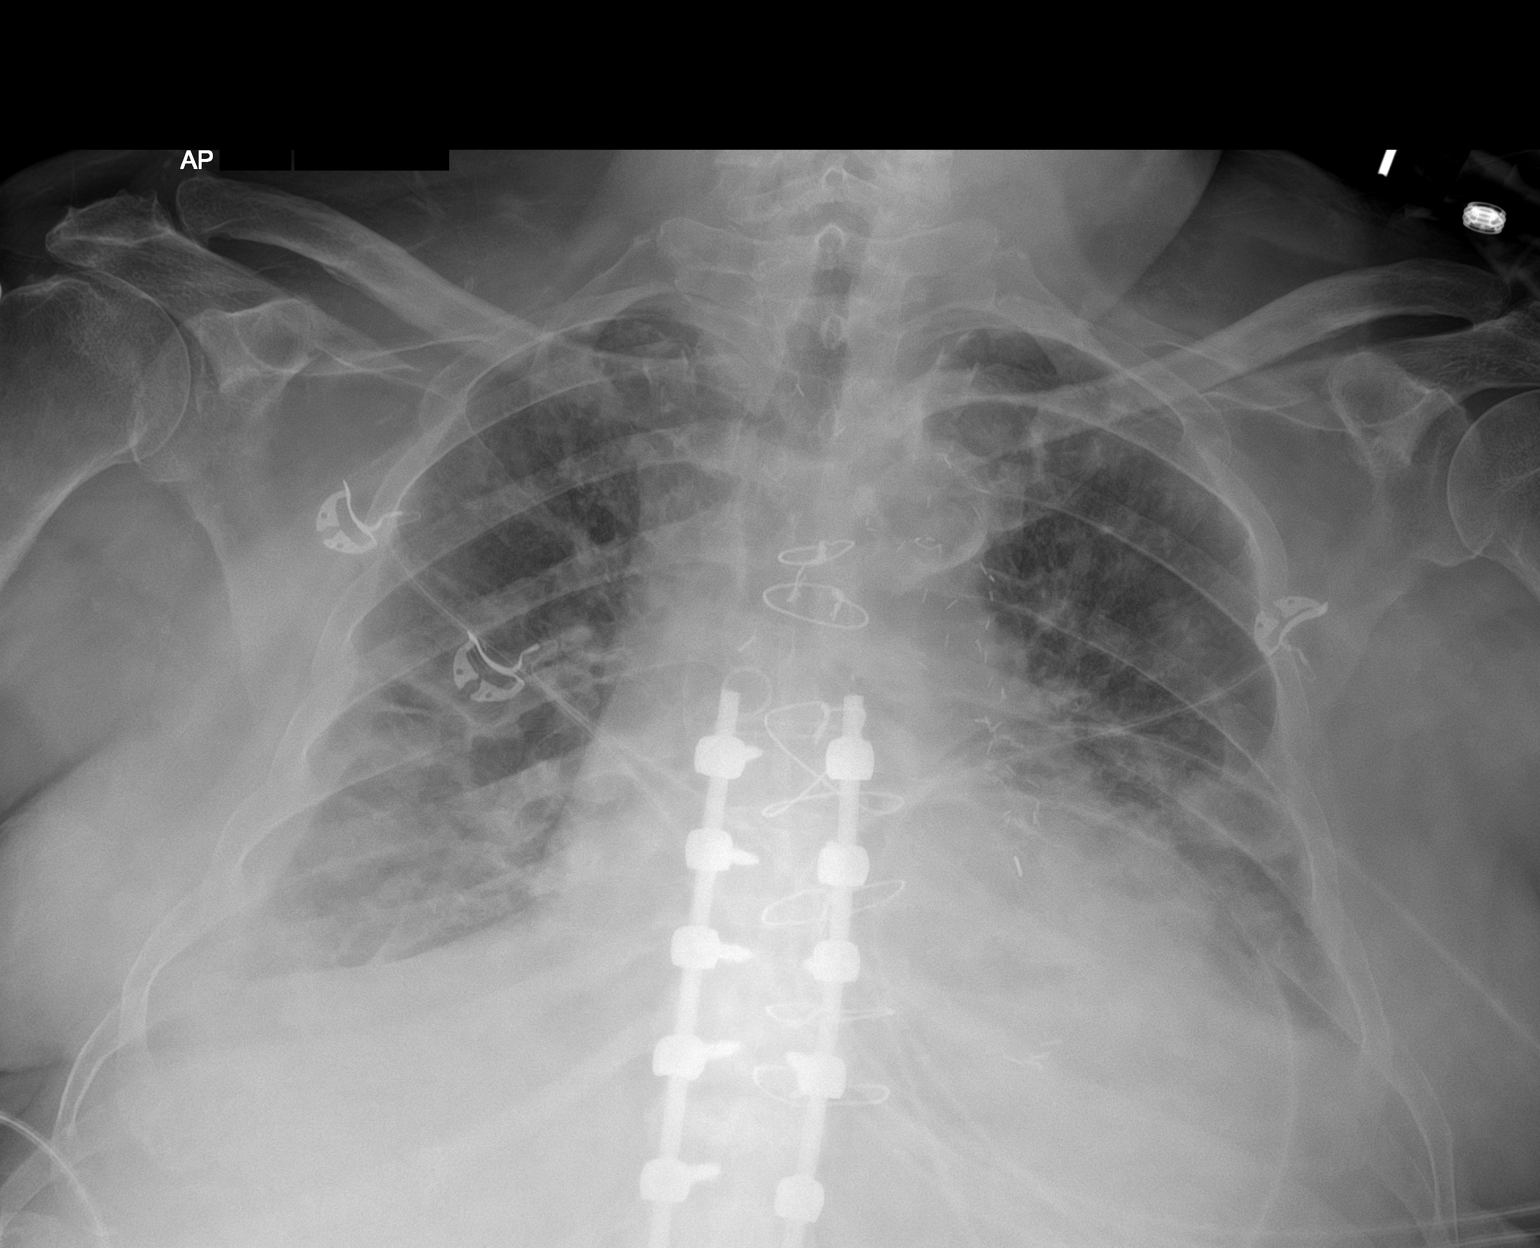

[1 of 1 positions shown; findings below may reference images not displayed]

FINDINGS: Slight interval worsening in lung aeration. Mild increase in the
hazy airspace opacities in the mid and lower lungs. More
consolidation noted at the left lung base partly silhouetting the
left hemidiaphragm. Small bilateral pleural effusions have likely
increased.

No pneumothorax.
IMPRESSION: 1. Mild interval worsening in lung aeration with findings suggesting
congestive heart failure with a mild increase in pleural effusions
and either atelectasis or airspace edema in the mid to lower lungs.

## 2022-08-01 IMAGING — CT CT CHEST W/O CM
3 of 6 series · 14 of 36 positions shown, 16 images · non-contrast
Comparison: CT thoracic spine 12/12/2018, radiograph 03/31/2020, CT
abdomen pelvis 03/14/2020

CLINICAL DATA: Acute hypercarbic respiratory failure

EXAM:
CT CHEST WITHOUT CONTRAST
TECHNIQUE: Multidetector CT imaging of the chest was performed following the
standard protocol without IV contrast.

[Series 4: thorax 2.0 (person_name) · axial · 0.81mm/px · z∈[-34,+166]mm · 7 of 134 slices shown, 9 images (1 of 2)]
[im 17/134  mediastinal]
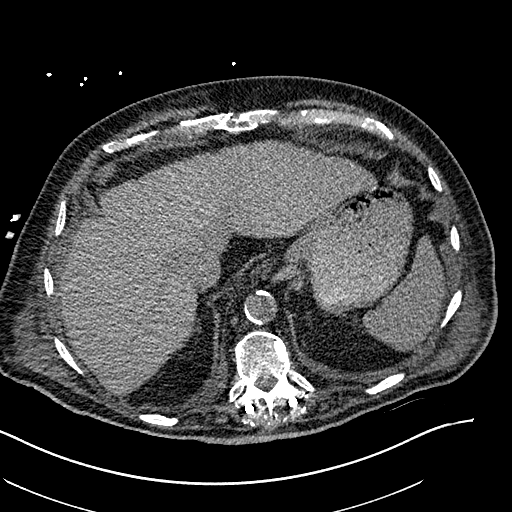
[im 17/134  lung]
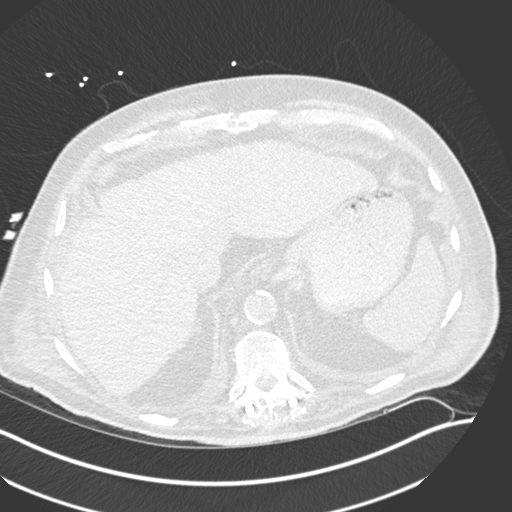
[im 34/134  lung]
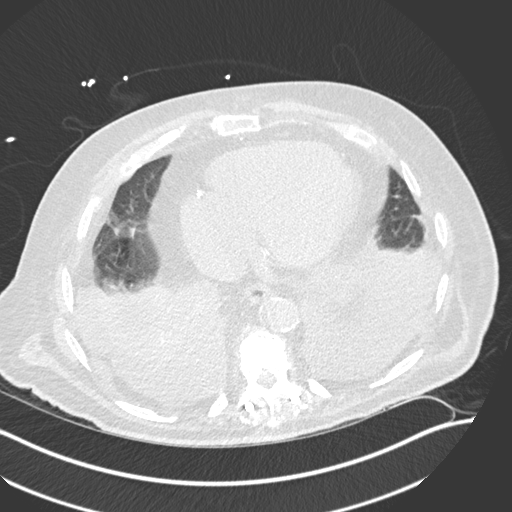
[im 50/134  lung]
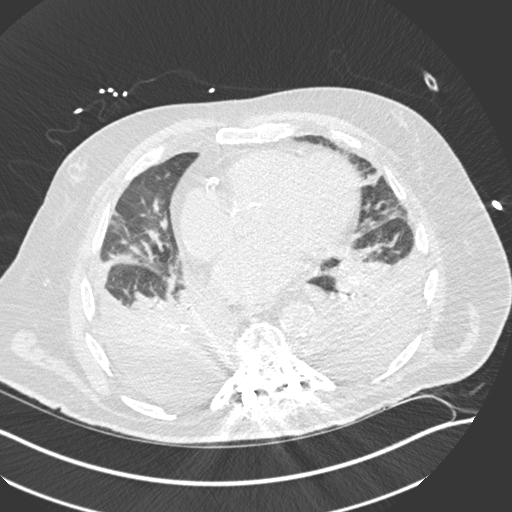
[im 67/134  lung]
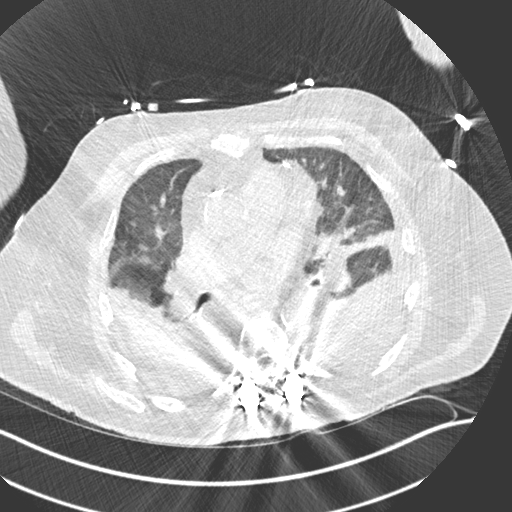
[im 84/134  mediastinal]
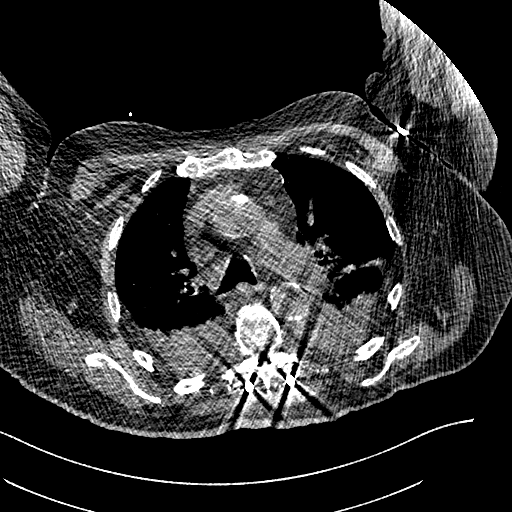
[im 84/134  lung]
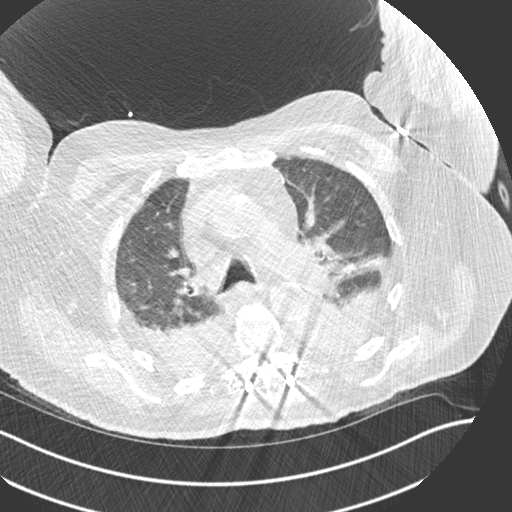
[im 100/134  lung]
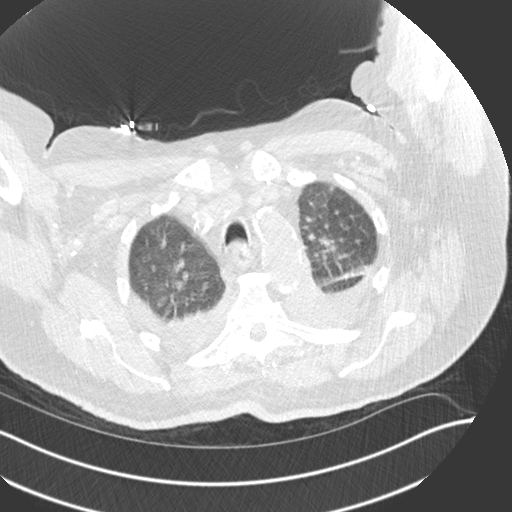
[im 117/134  lung]
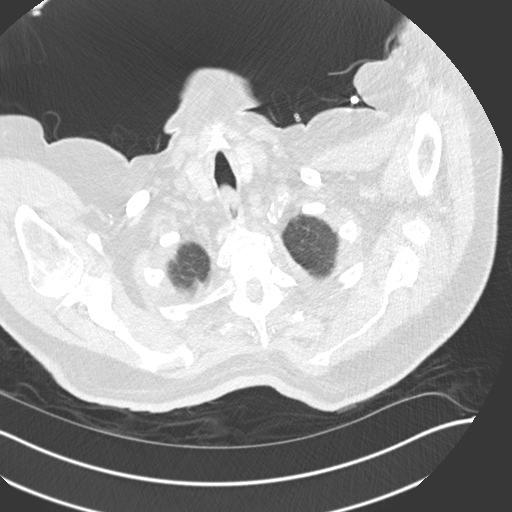

[Series 5: thorax 2.0 (person_name) · axial · 0.81mm/px · z∈[-34,+66]mm · 4 of 134 slices shown (2 of 2)]
[im 17/134  lung]
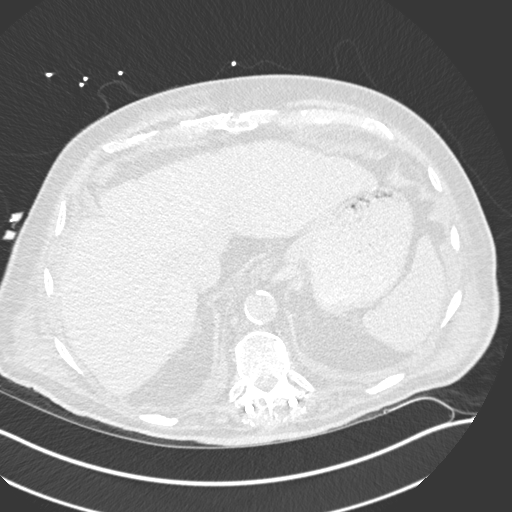
[im 34/134  lung]
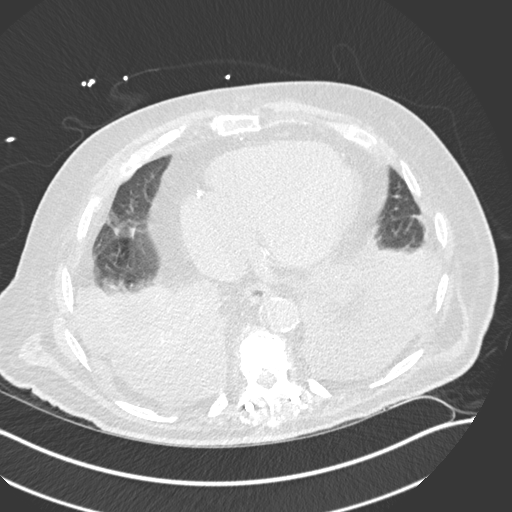
[im 50/134  lung]
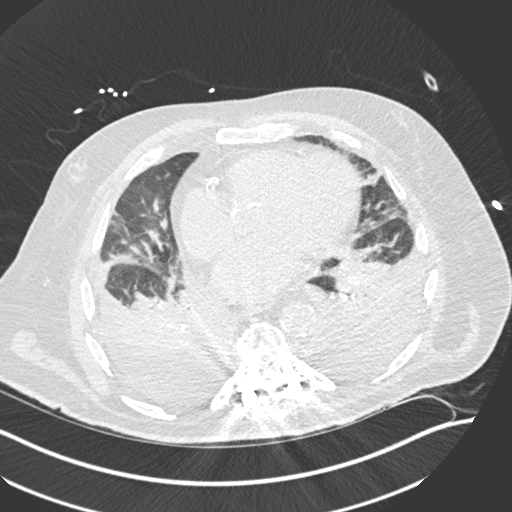
[im 67/134  lung]
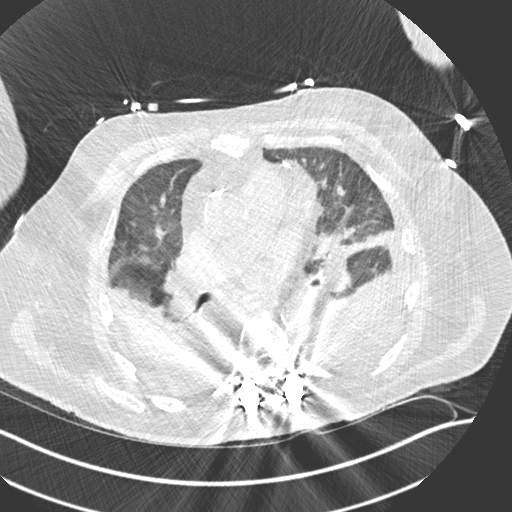

[Series 7: coronal · coronal · 0.59mm/px · 3 of 101 slices shown]
[im 21/101  lung]
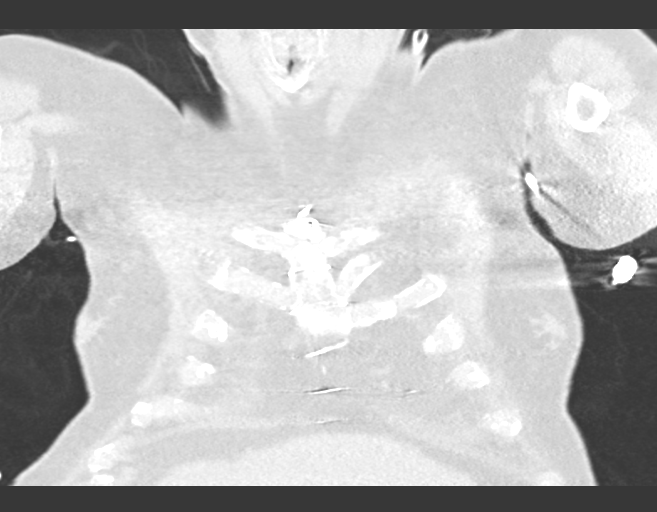
[im 41/101  lung]
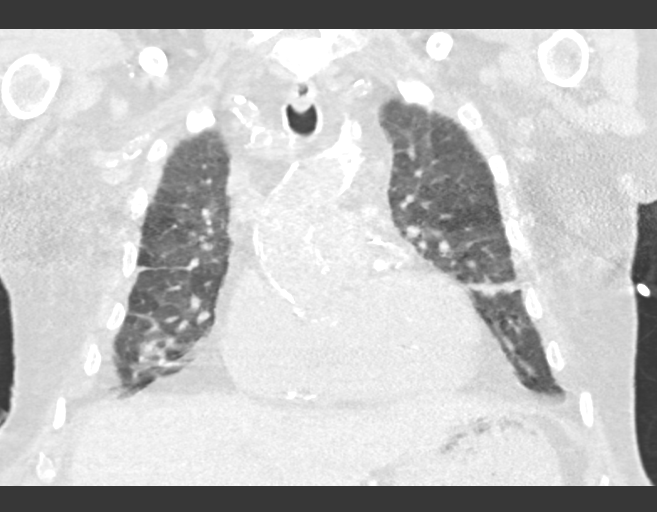
[im 61/101  lung]
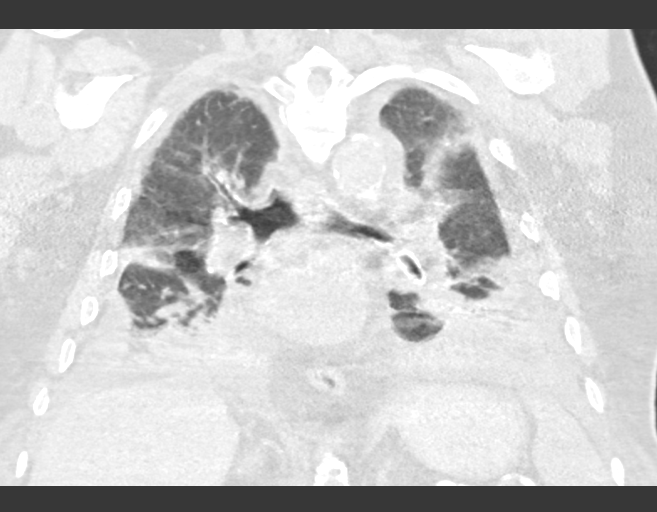

[14 of 36 positions shown; findings below may reference images not displayed]

FINDINGS: Cardiovascular: Postsurgical changes from prior sternotomy and CABG.
Cardiomegaly. Three-vessel coronary artery atherosclerosis.
Calcifications of the mitral annulus, aortic leaflets and chordae
tendinae. Hypoattenuation of the cardiac blood pool, likely anemia.
Atherosclerotic plaque within the normal caliber aorta. No
hyperdense mural thickening or plaque displacement. No focal
periaortic stranding or hemorrhage. Normal 3 vessel branching of the
aortic arch. Proximal great vessels are heavily calcified. Central
pulmonary arteries are borderline enlarged which may reflect some
pulmonary artery hypertension. Luminal evaluation of the vasculature
precluded in the absence of contrast media.

Mediastinum/Nodes: Anterior mediastinal stranding likely from prior
sternotomy and CABG. No mediastinal fluid or gas. Normal thyroid
gland and thoracic inlet. No acute abnormality of the trachea or
esophagus. Scattered low-attenuation mediastinal nodes, possibly
edematous or reactive. No axillary adenopathy. Hilar nodal
evaluation is limited in the absence of intravenous contrast media.

Lungs/Pleura: Neck moderate bilateral pleural effusions with fluid
tracking in the fissures. Adjacent dense atelectatic change.
Underlying infection is not fully excluded. More diffuse hazy
interstitial opacities with fissural and septal thickening and
vascular redistribution likely reflecting interstitial and alveolar
edema. No pneumothorax. Posterior bowing of the trachea reflecting
imaging during exhalation.

Upper Abdomen: Lobular liver surface contour, nonspecific though
could correlate for findings of intrinsic liver disease/cirrhosis.
Upper abdominal atherosclerosis. No acute abnormalities present in
the visualized portions of the upper abdomen.

Musculoskeletal: Diffuse body wall edema. Bilateral gynecomastia.
The osseous structures appear diffusely demineralized which may
limit detection of small or nondisplaced fractures. Degenerative
changes are present in the imaged spine and shoulders. Prior
sternotomy. Prior thoracolumbar fusion extending from T7 to levels
below the margin of imaging with bilateral transpedicular screws and
posterior fixation rods as well as fusion of the posterior elements.
No acute complication is evident. Remote posttraumatic deformity of
the T9-T10 levels. Background of multilevel bridging syndesmophytes
compatible with ankylosis. Multilevel degenerative changes as well.
No acute or worrisome osseous abnormalities.
IMPRESSION: 1. Moderate bilateral pleural effusions with fluid tracking in the
fissures. Adjacent dense passive and dependent atelectatic changes.
2. Additional features of CHF/volume overload with cardiomegaly,
interstitial and likely alveolar edema, and pulmonary vascular
congestion. Diffuse body wall edema present as well.
3. Lobular liver surface contour, nonspecific though could correlate
for findings of intrinsic liver disease/cirrhosis.
4. Prior thoracolumbar fusion extending from T7 to levels below the
margin of imaging spanning a prior fracture of the T9-10 level on a
background of ankylosis. No acute complication is evident.
5. Aortic Atherosclerosis ([id number]).
